# Patient Record
Sex: Female | Born: 1965 | Race: White | Hispanic: No | Marital: Married | Smoking: Former smoker
Health system: Southern US, Community
[De-identification: ages and names within clinical notes are randomized; demographics above are authoritative.]

## PROBLEM LIST (undated history)

## (undated) DIAGNOSIS — R011 Cardiac murmur, unspecified: Secondary | ICD-10-CM

## (undated) DIAGNOSIS — I7300 Raynaud's syndrome without gangrene: Secondary | ICD-10-CM

## (undated) DIAGNOSIS — R0600 Dyspnea, unspecified: Secondary | ICD-10-CM

## (undated) DIAGNOSIS — M3500 Sicca syndrome, unspecified: Secondary | ICD-10-CM

## (undated) DIAGNOSIS — F32A Depression, unspecified: Secondary | ICD-10-CM

## (undated) DIAGNOSIS — R51 Headache: Secondary | ICD-10-CM

## (undated) DIAGNOSIS — I639 Cerebral infarction, unspecified: Secondary | ICD-10-CM

## (undated) DIAGNOSIS — N189 Chronic kidney disease, unspecified: Secondary | ICD-10-CM

## (undated) DIAGNOSIS — T8859XA Other complications of anesthesia, initial encounter: Secondary | ICD-10-CM

## (undated) DIAGNOSIS — M797 Fibromyalgia: Secondary | ICD-10-CM

## (undated) DIAGNOSIS — E785 Hyperlipidemia, unspecified: Secondary | ICD-10-CM

## (undated) DIAGNOSIS — F419 Anxiety disorder, unspecified: Secondary | ICD-10-CM

## (undated) DIAGNOSIS — I10 Essential (primary) hypertension: Secondary | ICD-10-CM

## (undated) DIAGNOSIS — E119 Type 2 diabetes mellitus without complications: Secondary | ICD-10-CM

## (undated) DIAGNOSIS — I340 Nonrheumatic mitral (valve) insufficiency: Secondary | ICD-10-CM

## (undated) DIAGNOSIS — Z720 Tobacco use: Secondary | ICD-10-CM

## (undated) DIAGNOSIS — R0789 Other chest pain: Secondary | ICD-10-CM

## (undated) DIAGNOSIS — F329 Major depressive disorder, single episode, unspecified: Secondary | ICD-10-CM

## (undated) DIAGNOSIS — M329 Systemic lupus erythematosus, unspecified: Secondary | ICD-10-CM

## (undated) DIAGNOSIS — T4145XA Adverse effect of unspecified anesthetic, initial encounter: Secondary | ICD-10-CM

## (undated) DIAGNOSIS — R002 Palpitations: Secondary | ICD-10-CM

## (undated) DIAGNOSIS — M4800 Spinal stenosis, site unspecified: Secondary | ICD-10-CM

## (undated) DIAGNOSIS — M069 Rheumatoid arthritis, unspecified: Secondary | ICD-10-CM

## (undated) DIAGNOSIS — R519 Headache, unspecified: Secondary | ICD-10-CM

## (undated) DIAGNOSIS — M5416 Radiculopathy, lumbar region: Secondary | ICD-10-CM

## (undated) DIAGNOSIS — E669 Obesity, unspecified: Secondary | ICD-10-CM

## (undated) DIAGNOSIS — M4316 Spondylolisthesis, lumbar region: Secondary | ICD-10-CM

## (undated) HISTORY — DX: Radiculopathy, lumbar region: M54.16

## (undated) HISTORY — DX: Hyperlipidemia, unspecified: E78.5

## (undated) HISTORY — DX: Spinal stenosis, site unspecified: M48.00

## (undated) HISTORY — DX: Obesity, unspecified: E66.9

## (undated) HISTORY — PX: LUMBAR LAMINECTOMY: SHX95

## (undated) HISTORY — DX: Chronic kidney disease, unspecified: N18.9

## (undated) HISTORY — DX: Nonrheumatic mitral (valve) insufficiency: I34.0

## (undated) HISTORY — DX: Cerebral infarction, unspecified: I63.9

## (undated) HISTORY — DX: Other chest pain: R07.89

## (undated) HISTORY — PX: TUBAL LIGATION: SHX77

## (undated) HISTORY — DX: Anxiety disorder, unspecified: F41.9

## (undated) HISTORY — PX: ABDOMINAL HYSTERECTOMY: SHX81

## (undated) HISTORY — PX: BACK SURGERY: SHX140

## (undated) HISTORY — DX: Palpitations: R00.2

## (undated) HISTORY — PX: SPINAL CORD STIMULATOR INSERTION: SHX5378

## (undated) HISTORY — DX: Tobacco use: Z72.0

---

## 2001-06-14 ENCOUNTER — Other Ambulatory Visit: Admission: RE | Admit: 2001-06-14 | Discharge: 2001-06-14 | Payer: Self-pay | Admitting: Family Medicine

## 2005-05-06 ENCOUNTER — Ambulatory Visit: Payer: Self-pay | Admitting: Family Medicine

## 2006-09-08 ENCOUNTER — Ambulatory Visit: Payer: Self-pay | Admitting: Obstetrics and Gynecology

## 2006-09-16 ENCOUNTER — Inpatient Hospital Stay: Payer: Self-pay | Admitting: Obstetrics and Gynecology

## 2007-01-03 ENCOUNTER — Ambulatory Visit: Payer: Self-pay | Admitting: Obstetrics and Gynecology

## 2007-01-10 ENCOUNTER — Ambulatory Visit: Payer: Self-pay | Admitting: Obstetrics and Gynecology

## 2008-06-14 ENCOUNTER — Ambulatory Visit: Payer: Self-pay | Admitting: Internal Medicine

## 2011-02-23 ENCOUNTER — Ambulatory Visit: Payer: Self-pay

## 2014-05-23 DIAGNOSIS — O24419 Gestational diabetes mellitus in pregnancy, unspecified control: Secondary | ICD-10-CM | POA: Insufficient documentation

## 2014-05-23 DIAGNOSIS — R079 Chest pain, unspecified: Secondary | ICD-10-CM | POA: Insufficient documentation

## 2014-05-23 DIAGNOSIS — F32A Depression, unspecified: Secondary | ICD-10-CM | POA: Insufficient documentation

## 2014-05-23 DIAGNOSIS — F329 Major depressive disorder, single episode, unspecified: Secondary | ICD-10-CM | POA: Insufficient documentation

## 2014-05-23 DIAGNOSIS — R0602 Shortness of breath: Secondary | ICD-10-CM | POA: Insufficient documentation

## 2014-09-20 ENCOUNTER — Ambulatory Visit: Payer: Self-pay | Admitting: Nurse Practitioner

## 2015-07-22 ENCOUNTER — Emergency Department
Admission: EM | Admit: 2015-07-22 | Discharge: 2015-07-22 | Disposition: A | Discharge status: Home / Self Care | Payer: 59 | Attending: Emergency Medicine | Admitting: Emergency Medicine

## 2015-07-22 ENCOUNTER — Other Ambulatory Visit: Payer: Self-pay

## 2015-07-22 ENCOUNTER — Emergency Department: Payer: 59

## 2015-07-22 ENCOUNTER — Encounter: Payer: Self-pay | Admitting: Student

## 2015-07-22 DIAGNOSIS — R079 Chest pain, unspecified: Secondary | ICD-10-CM | POA: Insufficient documentation

## 2015-07-22 DIAGNOSIS — Z79899 Other long term (current) drug therapy: Secondary | ICD-10-CM | POA: Diagnosis not present

## 2015-07-22 DIAGNOSIS — Z720 Tobacco use: Secondary | ICD-10-CM | POA: Insufficient documentation

## 2015-07-22 DIAGNOSIS — E119 Type 2 diabetes mellitus without complications: Secondary | ICD-10-CM | POA: Diagnosis not present

## 2015-07-22 HISTORY — DX: Sjogren syndrome, unspecified: M35.00

## 2015-07-22 HISTORY — DX: Rheumatoid arthritis, unspecified: M06.9

## 2015-07-22 HISTORY — DX: Raynaud's syndrome without gangrene: I73.00

## 2015-07-22 HISTORY — DX: Type 2 diabetes mellitus without complications: E11.9

## 2015-07-22 HISTORY — DX: Systemic lupus erythematosus, unspecified: M32.9

## 2015-07-22 HISTORY — DX: Fibromyalgia: M79.7

## 2015-07-22 HISTORY — DX: Essential (primary) hypertension: I10

## 2015-07-22 LAB — BASIC METABOLIC PANEL
ANION GAP: 11 (ref 5–15)
BUN: 9 mg/dL (ref 6–20)
CHLORIDE: 101 mmol/L (ref 101–111)
CO2: 29 mmol/L (ref 22–32)
CREATININE: 0.95 mg/dL (ref 0.44–1.00)
Calcium: 9.2 mg/dL (ref 8.9–10.3)
GFR calc Af Amer: >60 mL/min (ref >60)
GFR calc non Af Amer: >60 mL/min (ref >60)
Glucose, Bld: 168 mg/dL — ABNORMAL HIGH (ref 65–99)
Potassium: 3.6 mmol/L (ref 3.5–5.1)
Sodium: 141 mmol/L (ref 135–145)

## 2015-07-22 LAB — CBC
HCT: 41.5 % (ref 35.0–47.0)
Hemoglobin: 13.9 g/dL (ref 12.0–16.0)
MCH: 30.8 pg (ref 26.0–34.0)
MCHC: 33.5 g/dL (ref 32.0–36.0)
MCV: 92.1 fL (ref 80.0–100.0)
Platelets: 282 10*3/uL (ref 150–440)
RBC: 4.51 MIL/uL (ref 3.80–5.20)
RDW: 15.1 % — AB (ref 11.5–14.5)
WBC: 8.5 10*3/uL (ref 3.6–11.0)

## 2015-07-22 LAB — TROPONIN I

## 2015-07-22 MED ORDER — IOHEXOL 300 MG/ML  SOLN
100.0000 mL | Freq: Once | INTRAMUSCULAR | Status: AC | PRN
  Administered 2015-07-22: 100 mL via INTRAVENOUS

## 2015-07-22 NOTE — ED Notes (Signed)

## 2015-07-22 NOTE — Discharge Instructions (Signed)
Chest Pain (Nonspecific) °It is often hard to give a specific diagnosis for the cause of chest pain. There is always a chance that your pain could be related to something serious, such as a heart attack or a blood clot in the lungs. You need to follow up with your health care provider for further evaluation. °CAUSES  °· Heartburn. °· Pneumonia or bronchitis. °· Anxiety or stress. °· Inflammation around your heart (pericarditis) or lung (pleuritis or pleurisy). °· A blood clot in the lung. °· A collapsed lung (pneumothorax). It can develop suddenly on its own (spontaneous pneumothorax) or from trauma to the chest. °· Shingles infection (herpes zoster virus). °The chest wall is composed of bones, muscles, and cartilage. Any of these can be the source of the pain. °· The bones can be bruised by injury. °· The muscles or cartilage can be strained by coughing or overwork. °· The cartilage can be affected by inflammation and become sore (costochondritis). °DIAGNOSIS  °Lab tests or other studies may be needed to find the cause of your pain. Your health care provider may have you take a test called an ambulatory electrocardiogram (ECG). An ECG records your heartbeat patterns over a 24-hour period. You may also have other tests, such as: °· Transthoracic echocardiogram (TTE). During echocardiography, sound waves are used to evaluate how blood flows through your heart. °· Transesophageal echocardiogram (TEE). °· Cardiac monitoring. This allows your health care provider to monitor your heart rate and rhythm in real time. °· Holter monitor. This is a portable device that records your heartbeat and can help diagnose heart arrhythmias. It allows your health care provider to track your heart activity for several days, if needed. °· Stress tests by exercise or by giving medicine that makes the heart beat faster. °TREATMENT  °· Treatment depends on what may be causing your chest pain. Treatment may include: °¨ Acid blockers for  heartburn. °¨ Anti-inflammatory medicine. °¨ Pain medicine for inflammatory conditions. °¨ Antibiotics if an infection is present. °· You may be advised to change lifestyle habits. This includes stopping smoking and avoiding alcohol, caffeine, and chocolate. °· You may be advised to keep your head raised (elevated) when sleeping. This reduces the chance of acid going backward from your stomach into your esophagus. °Most of the time, nonspecific chest pain will improve within 2-3 days with rest and mild pain medicine.  °HOME CARE INSTRUCTIONS  °· If antibiotics were prescribed, take them as directed. Finish them even if you start to feel better. °· For the next few days, avoid physical activities that bring on chest pain. Continue physical activities as directed. °· Do not use any tobacco products, including cigarettes, chewing tobacco, or electronic cigarettes. °· Avoid drinking alcohol. °· Only take medicine as directed by your health care provider. °· Follow your health care provider's suggestions for further testing if your chest pain does not go away. °· Keep any follow-up appointments you made. If you do not go to an appointment, you could develop lasting (chronic) problems with pain. If there is any problem keeping an appointment, call to reschedule. °SEEK MEDICAL CARE IF:  °· Your chest pain does not go away, even after treatment. °· You have a rash with blisters on your chest. °· You have a fever. °SEEK IMMEDIATE MEDICAL CARE IF:  °· You have increased chest pain or pain that spreads to your arm, neck, jaw, back, or abdomen. °· You have shortness of breath. °· You have an increasing cough, or you cough   up blood.  You have severe back or abdominal pain.  You feel nauseous or vomit.  You have severe weakness.  You faint.  You have chills. This is an emergency. Do not wait to see if the pain will go away. Get medical help at once. Call your local emergency services (911 in [place] Do not drive  yourself to the hospital. MAKE SURE YOU:   Understand these instructions.  Will watch your condition.  Will get help right away if you are not doing well or get worse. Document Released: 09/22/2005 Document Revised: 12/18/2013 Document Reviewed: 07/18/2008 [facility] Patient Information 2015 [place], [place]. This information is not intended to replace advice given to you by your health care provider. Make sure you discuss any questions you have with your health care provider. [facility] WILL CALL YOU TOMORROW TO SET UP AN APPOINTMENT IN THE NEXT FEW DAYS.

## 2015-07-22 NOTE — ED Provider Notes (Signed)
[facility] Emergency Department Provider Note  ____________________________________________  Time seen: Approximately 4:43 PM  I have reviewed the triage vital signs and the nursing notes.   HISTORY  Chief Complaint Chest Pain   HPI Lauren Winters is a 49 y.o. female who complains of approximately one week of chest pain. The pain is sharp in nature starts in the middle of her chest and goes through to the back. Lasts about 2-3 minutes and resolves. Nothing seems to bring it on make it worse or improve it she's had sitting on the couch excising does not change it eating does not change if breathing does not change it. Pain is severe when it occurs. Patient has had pain in her jaw couple times with it. Patient has a worse than usual all headache this headache is no you know where near the worst of her life. Patient had an episode Thursday where she got very sweaty this resolves in about 10 minutes.   Past Medical History  Diagnosis Date  . Hypertension   . Lupus (systemic lupus erythematosus)   . Rheumatoid arthritis   . Raynaud's phenomenon   . Fibromyalgia   . Sjogren's disease   . Diabetes mellitus without complication metformin    There are no active problems to display for this patient.   Past Surgical History  Procedure Laterality Date  . Abdominal hysterectomy    . Cesarean section      Current Outpatient Rx  Name  Route  Sig  Dispense  Refill  . Cholecalciferol (VITAMIN D3) 50000 UNITS CAPS   Oral   Take 1 capsule by mouth once a week.         . DULoxetine (CYMBALTA) 60 MG capsule   Oral   Take 1 capsule by mouth daily.         Marland Kitchen escitalopram (LEXAPRO) 20 MG tablet   Oral   Take 1 tablet by mouth daily.         . folic acid (FOLVITE) 1 MG tablet   Oral   Take 2 tablets by mouth daily.         . hydroxychloroquine (PLAQUENIL) 200 MG tablet   Oral   Take 1 tablet by mouth 2 (two) times daily.         . OTREXUP 20  MG/0.4ML SOAJ   Intramuscular   Inject 1 Syringe into the muscle once a week.           Dispense as written.     Allergies Clindamycin/lincomycin and Pseudoephedrine hcl  No family history on file.  Social History History  Substance Use Topics  . Smoking status: Current Every Day Smoker -- 0.50 packs/day    Types: Cigarettes  . Smokeless tobacco: Never Used  . Alcohol Use: No    Review of Systems Constitutional: No fever/chills Eyes: No visual changes. ENT: No sore throat. Cardiovascular: See history of present illness Respiratory: Denies shortness of breath. Gastrointestinal: No abdominal pain. no vomiting.  No diarrhea.  No constipation. Genitourinary: Negative for dysuria. Musculoskeletal see history of present illness Skin: Negative for rash. Neurological: Negative for headaches, focal weakness or numbness.  10-point ROS otherwise negative.  ____________________________________________   PHYSICAL EXAM:  VITAL SIGNS: ED Triage Vitals  Enc Vitals Group     BP 07/22/15 1504 152/98 mmHg     Pulse Rate 07/22/15 1504 124     Resp 07/22/15 1504 18     Temp 07/22/15 1504 98.2 F (36.8 C)  Temp Source 07/22/15 1504 Oral     SpO2 07/22/15 1504 97 %     Weight 07/22/15 1504 222 lb (100.699 kg)     Height 07/22/15 1504 5\' 7"  (1.702 m)     Head Cir --      Peak Flow --      Pain Score 07/22/15 1504 3     Pain Loc --      Pain Edu? --      Excl. in [place]? --     Constitutional: Alert and oriented. Well appearing and in no acute distress. Eyes: Conjunctivae are normal. PERRL. EOMI. Head: Atraumatic. Nose: No congestion/rhinnorhea. Mouth/Throat: Mucous membranes are moist.  Oropharynx non-erythematous. Neck: No stridor.  Cardiovascular: Normal rate, regular rhythm. Grossly normal heart sounds.  Good peripheral circulation. Respiratory: Normal respiratory effort.  No retractions. Lungs CTAB. Gastrointestinal: Soft and nontender. No distention. No abdominal  bruits. No CVA tenderness. Musculoskeletal: No lower extremity tenderness nor edema.  No joint effusions. Neurologic:  Normal speech and language. No gross focal neurologic deficits are appreciated. No gait instability. Skin:  Skin is warm, dry and intact. No rash noted. Psychiatric: Mood and affect are normal. Speech and behavior are normal.  ____________________________________________   LABS (all labs ordered are listed, but only abnormal results are displayed)  Labs Reviewed  BASIC METABOLIC PANEL - Abnormal; Notable for the following:    Glucose, Bld 168 (*)    All other components within normal limits  CBC - Abnormal; Notable for the following:    RDW 15.1 (*)    All other components within normal limits  TROPONIN I  TROPONIN I   ____________________________________________  EKG   EKG #1 read and interpreted by me shows sinus tachycardia at 108 bpm. Normal axis no acute ST-T wave changes EKG #2 was also read and interpreted by me that shows sinus tachycardia rate of 102 left axis -6 the other one was +6 of the really isn't much change. EKG is otherwise the same as the previous one ____________________________________________  RADIOLOGY  Chest x-ray was read as normal ____________________________________________   PROCEDURES    ____________________________________________   INITIAL IMPRESSION / ASSESSMENT AND PLAN / ED COURSE  Pertinent labs & imaging results that were available during my care of the patient were reviewed by me and considered in my medical decision making (see chart for details).  Repeat troponin was negative chest x-ray and CT of the chest showed no acute abnormalities. I discussed the patient with a [facility] and they will give her call tomorrow to set her up with an appointment in the next few days ____________________________________________   FINAL CLINICAL IMPRESSION(S) / ED DIAGNOSES  Final diagnoses:  Chest pain,  unspecified chest pain type      Nena Polio, MD 07/22/15 [id number]

## 2015-07-22 NOTE — ED Notes (Signed)
Pt states has been having intermittent chest pain for two weeks.  Reports nausea, jaw pain and headache associated with chest pain.  Denies shortness of breath. Pt states has had a dry cough. Pt in no acute distress at this time.

## 2015-07-23 ENCOUNTER — Telehealth: Payer: Self-pay

## 2015-07-23 NOTE — Telephone Encounter (Signed)
l mom to call and schedule f/u ED appt.

## 2015-07-24 ENCOUNTER — Encounter: Payer: Self-pay | Admitting: Physician Assistant

## 2015-07-24 ENCOUNTER — Encounter: Payer: Self-pay | Admitting: Cardiovascular Disease

## 2015-07-24 DIAGNOSIS — I10 Essential (primary) hypertension: Secondary | ICD-10-CM | POA: Insufficient documentation

## 2015-07-24 DIAGNOSIS — I340 Nonrheumatic mitral (valve) insufficiency: Secondary | ICD-10-CM | POA: Insufficient documentation

## 2015-07-24 DIAGNOSIS — E669 Obesity, unspecified: Secondary | ICD-10-CM | POA: Insufficient documentation

## 2015-07-24 DIAGNOSIS — E119 Type 2 diabetes mellitus without complications: Secondary | ICD-10-CM | POA: Insufficient documentation

## 2015-07-24 DIAGNOSIS — R0789 Other chest pain: Secondary | ICD-10-CM | POA: Insufficient documentation

## 2015-07-24 DIAGNOSIS — F419 Anxiety disorder, unspecified: Secondary | ICD-10-CM | POA: Insufficient documentation

## 2015-07-24 DIAGNOSIS — F411 Generalized anxiety disorder: Secondary | ICD-10-CM | POA: Insufficient documentation

## 2015-07-24 DIAGNOSIS — Z720 Tobacco use: Secondary | ICD-10-CM | POA: Insufficient documentation

## 2015-07-25 ENCOUNTER — Ambulatory Visit (INDEPENDENT_AMBULATORY_CARE_PROVIDER_SITE_OTHER): Payer: 59 | Admitting: Physician Assistant

## 2015-07-25 ENCOUNTER — Encounter: Payer: Self-pay | Admitting: Physician Assistant

## 2015-07-25 VITALS — BP 140/90 | HR 76 | Ht 67.0 in | Wt 222.0 lb

## 2015-07-25 DIAGNOSIS — E669 Obesity, unspecified: Secondary | ICD-10-CM

## 2015-07-25 DIAGNOSIS — F419 Anxiety disorder, unspecified: Secondary | ICD-10-CM

## 2015-07-25 DIAGNOSIS — Z720 Tobacco use: Secondary | ICD-10-CM | POA: Diagnosis not present

## 2015-07-25 DIAGNOSIS — R0602 Shortness of breath: Secondary | ICD-10-CM

## 2015-07-25 DIAGNOSIS — R0789 Other chest pain: Secondary | ICD-10-CM

## 2015-07-25 DIAGNOSIS — I340 Nonrheumatic mitral (valve) insufficiency: Secondary | ICD-10-CM

## 2015-07-25 DIAGNOSIS — R079 Chest pain, unspecified: Secondary | ICD-10-CM

## 2015-07-25 DIAGNOSIS — I10 Essential (primary) hypertension: Secondary | ICD-10-CM

## 2015-07-25 NOTE — Progress Notes (Signed)
Cardiology Hospital Follow Up Note:   Date of Encounter: 07/25/2015  ID: Lauren Winters, DOB 1966/01/21, MRN [id number]  PCP: Arnette Norris, MD Primary Cardiologist: Dr. Fletcher Anon, MD  Chief Complaint  Patient presents with  . other    Patient was just at [facility] ER on 07/22/2015 with chest pain. Meds reviewed by the patient verbally.      HPI:  49 year old female with history of HTN, HLD, DM2, Lupus, fibromyalgia, Sjogren's syndrome, RA, anxiety, atypical chest pain, and ongoing tobacco abuse who presents for ED follow up of chest pain.    Patient was previously seen by Dr. Serafina Royals, MD. She previously saw him on 05/23/2014 for complaint of chest pain/heaviness that was radiating to the jaw that was occuring with stress and anxiety. She was also note to be complaining of SOB that began back in January 2015 that occured with mild exertion and limited her ADLs and was improved with rest. She underwent echo on 06/05/2014 that showed EF >55%, mild MR, mild AR, normal LV and RV systolic function, no valvular stenosis and ETT on the same day that was normal per follow up note, without evidence of myocardial ischemia or chest pain at peak stress, with good exercise tolerance. In follow up on 06/12/2014 with Dr. Nehemiah Massed, she wished to not pursue statin therapy given prior fatigue and arthralgia with statins though she did have significant coronary risk factors. Plan was to continue her Bp regimen without further cardiac intervention for her above valvular heart disease as this was stable at that time. There was also no plan for cardiac intervention for her chest pain given her normal stress test or plans for further intervention given her SOB given mulifactoral etiologies and stable nature. She was advised to reduce her CAD risks.   She presented to [facility] ED on 7/26 with complaints of chest pain x 1 week that has been sharp in nature and starts in the middle of her chest and goes through to the back.  Pain is intermittent, lasting 2-3 minutes and self resolving. Nothing makes pain worse or improves it. She has had jaw pain x 1. No associated nausea, vomiting, diaphoresis, SOB, presyncope, or syncope.    Work up at the ED included negative troponin x 2, unremarkable bmet, and CBC. CXR showed no active cardiopulmonary disease. CTA chest was negative for PE and was negative for significant abnormality, including the thoracic aorta was normal in caliber and intact. Inital EKG sinus tachycardia, 108 bpm, moderate voltage critera for LVH, nonspecific st/t changes along anterior and inferolateral leads. Follow up EKF showed sinus tachycardia, 102 bpm, TWI III, otherwise unchaged. BP was 152/98. She was discharged from the ED with outpatient follow up.   Since her discharge from the ED she has continued to have intermittent sharp chest pain that is both exertional and non-exertional, though per her report seems to be mostly non-exertional. She has only had exertional symptoms x 1, which was when she was climbing stairs to her work. Symptoms resolved with rest. She has chronic SOB at baseline, which has not worsened, at at time of this visit she denies any SOB. She denies any diaphoresis, nausea, vomiting, presyncope, or syncope. She reports quitting smoking on 07/22/2015. She is currently chest pain free.        Past Medical History  Diagnosis Date  . Hypertension   . Lupus (systemic lupus erythematosus)   . Rheumatoid arthritis   . Raynaud's phenomenon   . Fibromyalgia   .  Sjogren's disease   . Diabetes mellitus without complication metformin  . Mitral regurgitation     a. 05/2014 echo: EF >55%, nl LV/RV sys fxn, mild MR/AR, no valvular stenosis   . Atypical chest pain     a. ETT 05/2014: normal stress test without evidence of myocardial ischemia or chest pain at peak stress  . Tobacco abuse     a. ongoing; b. ongoing SOB  . Obesity (BMI 30-39.9)   . Anxiety   : Past Surgical History  Procedure  Laterality Date  . Abdominal hysterectomy    . Cesarean section    : Family History  Problem Relation Age of Onset  . Hypertension Mother   . Valvular heart disease Mother   . CAD Father 61    CABG x 4  . Liver cancer Father 72    passed  . CAD Paternal Aunt     CABG  . CAD Paternal Aunt     CABG  . CAD Paternal Aunt     CABG  . CAD Paternal Aunt     CABG  . CAD Paternal Aunt     CABG  . CAD Paternal Uncle     MI s/p CABG  . Stroke Maternal Grandmother   . Stroke Maternal Grandfather   . CAD Maternal Grandfather   :  reports that she quit smoking 3 days ago. Her smoking use included Cigarettes. She smoked 0.50 packs per day. She has never used smokeless tobacco. She reports that she does not drink alcohol or use illicit drugs.:   Allergies:  Allergies  Allergen Reactions  . Clindamycin/Lincomycin Hives  . Pseudoephedrine Hcl Other (See Comments)    Dizziness      Home Medications:  Current Outpatient Prescriptions  Medication Sig Dispense Refill  . Cholecalciferol (VITAMIN D3) 50000 UNITS CAPS Take 1 capsule by mouth once a week.    . escitalopram (LEXAPRO) 20 MG tablet Take 20 mg by mouth daily.     . folic acid (FOLVITE) 1 MG tablet Take 2 tablets by mouth daily.    . hydroxychloroquine (PLAQUENIL) 200 MG tablet Take 1 tablet by mouth 2 (two) times daily.    . OTREXUP 20 MG/0.4ML SOAJ Inject 1 Syringe into the muscle once a week.     No current facility-administered medications for this visit.     Review of Systems:  Review of Systems  Constitutional: Positive for malaise/fatigue. Negative for fever, chills, weight loss and diaphoresis.  HENT: Negative for congestion.   Eyes: Negative for discharge and redness.  Respiratory: Positive for shortness of breath. Negative for cough, hemoptysis, sputum production and wheezing.        SOB long standing issue for her and unchanged   Cardiovascular: Positive for chest pain. Negative for palpitations, orthopnea,  claudication, leg swelling and PND.  Gastrointestinal: Negative for heartburn, nausea, vomiting and abdominal pain.  Musculoskeletal: Negative for myalgias and falls.  Skin: Negative for rash.  Neurological: Positive for weakness and headaches. Negative for sensory change, speech change and focal weakness.  Endo/Heme/Allergies: Does not bruise/bleed easily.  Psychiatric/Behavioral: Positive for substance abuse. The patient is nervous/anxious.        Tobacco abuse  All other systems reviewed and are negative.    Physical Exam:  Blood pressure 140/90, pulse 76, height 5\' 7"  (1.702 m), weight 222 lb (100.699 kg). BMI: Body mass index is 34.76 kg/(m^2). General: Pleasant, NAD. Psych: Normal affect. Responds to questions with normal affect.  Neuro: Alert and oriented  X 3. Moves all extremities spontaneously. HEENT: Normocephalic, atraumatic. EOM intact bilaterally. Sclera anicteric.  Neck: Trachea midline. Supple without bruits or JVD. Lungs:  Respirations regular and unlabored, CTA bilaterally without wheezing, crackles, or rhonchi.  Heart: RRR, normal s3, s4. No murmurs, rubs, or gallops.  Abdomen: Obese, soft, non-tender, non-distended, BS + x 4.  Extremities: No clubbing, cyanosis or edema. DP/PT/Radials 2+ and equal bilaterally.   Accessory Clinical Findings:  EKG: NSR, 76 bpm, TWI III  Recent Labs: 07/22/2015: BUN 9; Creatinine, Ser 0.95; Hemoglobin 13.9; Platelets 282; Potassium 3.6; Sodium 141  No results found for: CHOL, TRIG, HDL, CHOLHDL, VLDL, LDLCALC, LDLDIRECT  Weights: Wt Readings from Last 3 Encounters:  07/25/15 222 lb (100.699 kg)  07/22/15 222 lb (100.699 kg)    Estimated Creatinine Clearance: 85.4 mL/min (by C-G formula based on Cr of 0.95).   Other studies Reviewed: Additional studies/ records that were reviewed today include: [facility] notes and studies, and [facility] ED visit.  Assessment & Plan:  1. Chest pain: -Schedule Treadmill Myoview to evaluate  for high risk ischemia. Ultimately, she may require cardiac cath should her symptoms persist given given her family history and prior smoking history  -Schedule echo to evaluate LV function and wall motion  -Recent CTA chest negative for PE and aortic pathology  -Start aspirin 81 mg daily  -Possible some component of anxiety vs fibromyalgia   2. Chronic SOB: -Stable if not improved since she quit smoking  -Check echo as above -Should symptoms worsen would advise patient to see pulmonology   3. HTN: -Controlled  4. HLD: -Schedule FLP prior to next office visit   5. DM2: -Per PCP  5. Rheumatologic diseases: -Per PCP  6. Tobacco abuse: -Quit on 07/22/2015  7. Mild MR: -Check echo as above   Dispo: -Follow up 3 months  Current medicines are reviewed at length with the patient today.  The patient did not have any concerns regarding medicines.   Christell Faith, PA-C [address] [place], [place] [address] [phone] [facility] 07/25/2015, 3:28 PM

## 2015-07-25 NOTE — Patient Instructions (Addendum)
Medication Instructions:  Your physician recommends that you continue on your current medications as directed. Please refer to the Current Medication list given to you today.   Labwork: none  Testing/Procedures: Your physician has requested that you have a lexiscan myoview.  [place]  Your caregiver has ordered a Stress Test with nuclear imaging. The purpose of this test is to evaluate the blood supply to your heart muscle. This procedure is referred to as a "Non-Invasive Stress Test." This is because other than having an IV started in your vein, nothing is inserted or "invades" your body. Cardiac stress tests are done to find areas of poor blood flow to the heart by determining the extent of coronary artery disease (CAD). Some patients exercise on a treadmill, which naturally increases the blood flow to your heart, while others who are  unable to walk on a treadmill due to physical limitations have a pharmacologic/chemical stress agent called Lexiscan . This medicine will mimic walking on a treadmill by temporarily increasing your coronary blood flow.   Please note: these test may take anywhere between 2-4 hours to complete  PLEASE REPORT TO [facility] AT THE FIRST DESK WILL DIRECT YOU WHERE TO GO  Date of Procedure: Friday, August 5 at 8:30am Arrival Time for Procedure: 7:45am   PLEASE NOTIFY THE OFFICE AT LEAST 24 HOURS IN ADVANCE IF YOU ARE UNABLE TO KEEP YOUR APPOINTMENT.  [phone] AND  PLEASE NOTIFY NUCLEAR MEDICINE AT [facility] AT LEAST 24 HOURS IN ADVANCE IF YOU ARE UNABLE TO KEEP YOUR APPOINTMENT. [phone]  How to prepare for your Myoview test:   Do not eat or drink after midnight  No caffeine for 24 hours prior to test  No smoking 24 hours prior to test.  Your medication may be taken with water.  If your doctor stopped a medication because of this test, do not take that medication.  Ladies, please do not wear dresses.  Skirts or  pants are appropriate. Please wear a short sleeve shirt.  No perfume, cologne or lotion.  Wear comfortable walking shoes. No heels!    Your physician has requested that you have an echocardiogram. Echocardiography is a painless test that uses sound waves to create images of your heart. It provides your doctor with information about the size and shape of your heart and how well your heart's chambers and valves are working. This procedure takes approximately one hour. There are no restrictions for this procedure.          Follow-Up: Your physician recommends that you schedule a follow-up appointment in: one month with Christell Faith, PA-C or Dr. Fletcher Anon   Any Other Special Instructions Will Be Listed Below (If Applicable).  Echocardiogram An echocardiogram, or echocardiography, uses sound waves (ultrasound) to produce an image of your heart. The echocardiogram is simple, painless, obtained within a short period of time, and offers valuable information to your health care provider. The images from an echocardiogram can provide information such as:  Evidence of coronary artery disease (CAD).  Heart size.  Heart muscle function.  Heart valve function.  Aneurysm detection.  Evidence of a past heart attack.  Fluid buildup around the heart.  Heart muscle thickening.  Assess heart valve function. LET [facility] CARE PROVIDER KNOW ABOUT:  Any allergies you have.  All medicines you are taking, including vitamins, herbs, eye drops, creams, and over-the-counter medicines.  Previous problems you or members of your family have had with the use of  anesthetics.  Any blood disorders you have.  Previous surgeries you have had.  Medical conditions you have.  Possibility of pregnancy, if this applies. BEFORE THE PROCEDURE  No special preparation is needed. Eat and drink normally.  PROCEDURE   In order to produce an image of your heart, gel will be applied to your chest and a  wand-like tool (transducer) will be moved over your chest. The gel will help transmit the sound waves from the transducer. The sound waves will harmlessly bounce off your heart to allow the heart images to be captured in real-time motion. These images will then be recorded.  You may need an IV to receive a medicine that improves the quality of the pictures. AFTER THE PROCEDURE You may return to your normal schedule including diet, activities, and medicines, unless your health care provider tells you otherwise. Document Released: 12/10/2000 Document Revised: 04/29/2014 Document Reviewed: 08/20/2013 [facility] Patient Information 2015 [place], [place]. This information is not intended to replace advice given to you by your health care provider. Make sure you discuss any questions you have with your health care provider.

## 2015-07-31 ENCOUNTER — Other Ambulatory Visit: Payer: Self-pay

## 2015-07-31 ENCOUNTER — Ambulatory Visit (INDEPENDENT_AMBULATORY_CARE_PROVIDER_SITE_OTHER): Payer: 59

## 2015-07-31 DIAGNOSIS — R079 Chest pain, unspecified: Secondary | ICD-10-CM | POA: Diagnosis not present

## 2015-08-01 ENCOUNTER — Encounter
Admission: RE | Admit: 2015-08-01 | Discharge: 2015-08-01 | Disposition: A | Discharge status: Home / Self Care | Payer: 59 | Source: Ambulatory Visit | Attending: Physician Assistant | Admitting: Physician Assistant

## 2015-08-01 ENCOUNTER — Other Ambulatory Visit: Payer: Self-pay | Admitting: Physician Assistant

## 2015-08-01 ENCOUNTER — Encounter: Payer: Self-pay | Admitting: Physician Assistant

## 2015-08-01 DIAGNOSIS — R079 Chest pain, unspecified: Secondary | ICD-10-CM | POA: Diagnosis not present

## 2015-08-01 LAB — NM MYOCAR MULTI W/SPECT W/WALL MOTION / EF
CHL CUP RESTING HR STRESS: 71 {beats}/min
CSEPPHR: 150 {beats}/min
Estimated workload: 5.8 METS
Exercise duration (min): 4 min
LV dias vol: 78 mL
LV sys vol: 27 mL
NUC STRESS TID: 0.92
Percent HR: 88 %
SDS: 0
SRS: 1
SSS: 1

## 2015-08-01 MED ORDER — TECHNETIUM TC 99M SESTAMIBI GENERIC - CARDIOLITE
13.8500 | Freq: Once | INTRAVENOUS | Status: AC | PRN
  Administered 2015-08-01: 13.847 via INTRAVENOUS

## 2015-08-01 MED ORDER — METOPROLOL TARTRATE 50 MG PO TABS: 25.0000 mg | ORAL_TABLET | Freq: Two times a day (BID) | ORAL | Status: DC

## 2015-08-01 MED ORDER — TECHNETIUM TC 99M SESTAMIBI GENERIC - CARDIOLITE
30.0000 | Freq: Once | INTRAVENOUS | Status: AC | PRN
  Administered 2015-08-01: 32.09 via INTRAVENOUS

## 2015-08-01 NOTE — Progress Notes (Signed)
Typed OOW note for patient.

## 2015-08-04 ENCOUNTER — Encounter: Payer: Self-pay | Admitting: Primary Care

## 2015-08-04 ENCOUNTER — Telehealth: Payer: Self-pay | Admitting: Physician Assistant

## 2015-08-04 ENCOUNTER — Ambulatory Visit (INDEPENDENT_AMBULATORY_CARE_PROVIDER_SITE_OTHER): Payer: 59 | Admitting: Primary Care

## 2015-08-04 VITALS — BP 148/96 | HR 75 | Temp 97.9°F | Ht 67.0 in | Wt 219.8 lb

## 2015-08-04 DIAGNOSIS — E119 Type 2 diabetes mellitus without complications: Secondary | ICD-10-CM

## 2015-08-04 DIAGNOSIS — M329 Systemic lupus erythematosus, unspecified: Secondary | ICD-10-CM

## 2015-08-04 DIAGNOSIS — I10 Essential (primary) hypertension: Secondary | ICD-10-CM

## 2015-08-04 DIAGNOSIS — R768 Other specified abnormal immunological findings in serum: Secondary | ICD-10-CM | POA: Insufficient documentation

## 2015-08-04 DIAGNOSIS — R0789 Other chest pain: Secondary | ICD-10-CM | POA: Diagnosis not present

## 2015-08-04 DIAGNOSIS — M069 Rheumatoid arthritis, unspecified: Secondary | ICD-10-CM

## 2015-08-04 DIAGNOSIS — M3500 Sicca syndrome, unspecified: Secondary | ICD-10-CM

## 2015-08-04 DIAGNOSIS — M3219 Other organ or system involvement in systemic lupus erythematosus: Secondary | ICD-10-CM | POA: Insufficient documentation

## 2015-08-04 HISTORY — DX: Other specified abnormal immunological findings in serum: R76.8

## 2015-08-04 LAB — HEMOGLOBIN A1C: Hgb A1c MFr Bld: 5.9 % (ref 4.6–6.5)

## 2015-08-04 MED ORDER — VALSARTAN 320 MG PO TABS: 320.0000 mg | ORAL_TABLET | Freq: Every day | ORAL | Status: DC

## 2015-08-04 MED ORDER — METFORMIN HCL 500 MG PO TABS: 500.0000 mg | ORAL_TABLET | Freq: Two times a day (BID) | ORAL | Status: DC

## 2015-08-04 NOTE — Assessment & Plan Note (Signed)
Follows with Dr. Judi Cong. Next appointment is in December.

## 2015-08-04 NOTE — Progress Notes (Signed)
Subjective:    Patient ID: Lauren Winters, female    DOB: 15-Oct-1966, 49 y.o.   MRN: [id number]  HPI  [name] is a 49 year old female who presents today to establish care and discuss the problems mentioned below. Will obtain old records. She is also here for hospital follow up.  1) Diabetes Type 2: Diagnosed in March 2016. She is managed on metformin 500 mg BID. She believes her A1C was 6.8 and last measured in March 2016.   2) Anxiety: Managed on Lexapro 20 mg and without the medication she feels very irritable. She feels well managed at her current dose.  3) Essential Hypertension: Diagnosed in 2007. She is managed on valsartan 320 mg and was recently started on metoprolol 50 mg per cardiology. She does not check her blood pressure at home because her blood pressure had been stable. She reports headaches, chest pain. Denies blurred vision. She reports some dizziness since starting the metoprolol. Next appointment with cardiology is in September.   4) Hospital Follow up: Presented to [facility] ED on 7/26 with a chief complaint of chest pain. She underwent full working up including chest xray, CTA, and lab work including troponin. All labs and tests were negative. She followed up with cardiology on 7/29 and had a treadmill myoview on 08/01/15 to evaluate for high risk ischemia. She underwent echocardiogram on 07/31/15 which resulted in normal LV function, mild mitral regurgitation and mild aortic insuffiencey.   5) Lupus/Rheumatoid Arthritis/Sjogrens: Diagnosed with Lupus 2 years ago. She managed by Dr. Valora Piccolo at [place] and has an appointment this December. She will get steroid injections once every 3 months. She also reports fatigue/exhaustion that started in February 2014 and is now affecting her work life as she feels tired. She recently had her thyroid and CBC panel checked by Dr. Judi Cong and reports these levels were within normal limits.   Review of Systems    Constitutional: Negative for unexpected weight change.  HENT: Negative for rhinorrhea.   Respiratory: Negative for cough and shortness of breath.   Cardiovascular: Negative for chest pain.  Gastrointestinal: Negative for diarrhea and constipation.  Genitourinary: Negative for difficulty urinating.  Musculoskeletal: Positive for arthralgias. Negative for myalgias.       Chronic joint pain with RA.   Skin: Negative for rash.  Neurological: Negative for headaches.  Psychiatric/Behavioral:       See HPI       Past Medical History  Diagnosis Date  . Hypertension   . Lupus (systemic lupus erythematosus)   . Rheumatoid arthritis   . Raynaud's phenomenon   . Fibromyalgia   . Sjogren's disease   . Diabetes mellitus without complication metformin  . Mitral regurgitation     a. 05/2014 echo: EF >55%, nl LV/RV sys fxn, mild MR/AR, no valvular stenosis   . Atypical chest pain     a. ETT 05/2014: normal stress test without evidence of myocardial ischemia or chest pain at peak stress  . Tobacco abuse     a. ongoing; b. ongoing SOB  . Obesity (BMI 30-39.9)   . Anxiety     History   Social History  . Marital Status: Married    Spouse Name: N/A  . Number of Children: N/A  . Years of Education: N/A   Occupational History  . Not on file.   Social History Main Topics  . Smoking status: Former Smoker -- 0.50 packs/day    Types: Cigarettes    Quit date:  07/22/2015  . Smokeless tobacco: Never Used  . Alcohol Use: No  . Drug Use: No  . Sexual Activity: Not on file   Other Topics Concern  . Not on file   Social History Narrative   Married.   3 children.   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     Past Surgical History  Procedure Laterality Date  . Abdominal hysterectomy    . Cesarean section      x3    Family History  Problem Relation Age of Onset  . Hypertension Mother   . Valvular heart disease Mother   . CAD Father 67    CABG x  4  . Liver cancer Father 29    passed  . CAD Paternal Aunt     CABG  . CAD Paternal Aunt     CABG  . CAD Paternal Aunt     CABG  . CAD Paternal Aunt     CABG  . CAD Paternal Aunt     CABG  . CAD Paternal Uncle     MI s/p CABG  . Stroke Maternal Grandmother   . Stroke Maternal Grandfather   . CAD Maternal Grandfather     Allergies  Allergen Reactions  . Clindamycin/Lincomycin Hives  . Pseudoephedrine Hcl Other (See Comments)    Dizziness     Current Outpatient Prescriptions on File Prior to Visit  Medication Sig Dispense Refill  . Cholecalciferol (VITAMIN D3) 50000 UNITS CAPS Take 1 capsule by mouth once a week.    . escitalopram (LEXAPRO) 20 MG tablet Take 20 mg by mouth daily.     . folic acid (FOLVITE) 1 MG tablet Take 2 tablets by mouth daily.    . hydroxychloroquine (PLAQUENIL) 200 MG tablet Take 1 tablet by mouth 2 (two) times daily.    . metoprolol (LOPRESSOR) 50 MG tablet Take 0.5 tablets (25 mg total) by mouth 2 (two) times daily. 60 tablet 5  . OTREXUP 20 MG/0.4ML SOAJ Inject 1 Syringe into the muscle once a week.     No current facility-administered medications on file prior to visit.    BP 148/96 mmHg  Pulse 75  Temp(Src) 97.9 F (36.6 C) (Oral)  Ht 5\' 7"  (1.702 m)  Wt 219 lb 12.8 oz (99.701 kg)  BMI 34.42 kg/m2  SpO2 98%    Objective:   Physical Exam  Constitutional: She is oriented to person, place, and time. She appears well-nourished.  HENT:  Head: Normocephalic.  Neck: Neck supple.  Cardiovascular: Normal rate and regular rhythm.   Pulmonary/Chest: Effort normal and breath sounds normal.  Neurological: She is alert and oriented to person, place, and time.  Skin: Skin is warm and dry.  Psychiatric: She has a normal mood and affect.          Assessment & Plan:

## 2015-08-04 NOTE — Progress Notes (Signed)
Pre visit review using our clinic review tool, if applicable. No additional management support is needed unless otherwise documented below in the visit note. 

## 2015-08-04 NOTE — Assessment & Plan Note (Signed)
Diagnosed in March 2016 with A1C at 6.8? Recheck A1C today and is 5.9. Continue metformin 500 BID. Will continue to monitor and discuss her diet next visit in one month.

## 2015-08-04 NOTE — Patient Instructions (Signed)
Complete lab work prior to leaving today. I will notify you of your results.  I have sent in refills of your Losartan. I will refill the Metformin depending on your lab results.  Follow up in 4 weeks for re-evaluation of blood pressure and diabetes.   It was a pleasure to meet you today! Please don't hesitate to call me with any questions. Welcome to [facility]!

## 2015-08-04 NOTE — Assessment & Plan Note (Signed)
Follows with Dr. Judi Cong. Next appointment is in December

## 2015-08-04 NOTE — Assessment & Plan Note (Signed)
Follows with cardiology. Work up at [facility] ED negative. Stress test and echo obtained, mostly unremarkable. She is to follow up in one month for re-evaluation. Will continue to monitor BP.

## 2015-08-04 NOTE — Assessment & Plan Note (Signed)
Since 2007, once well managed on losartan alone. Cardiology evaluated and added metoprolol 50 mg recently. BP elevated today, due to recent addition of metoprolol will have patient check her BP at home, record, and bring them to our next visit. If BP remains consistently elevated will consider adding additional med.

## 2015-08-04 NOTE — Telephone Encounter (Signed)
Lauren Winters .  Patient says she can leave msg on cell.

## 2015-08-04 NOTE — Telephone Encounter (Signed)
See results not. LM on cell phone

## 2015-08-05 ENCOUNTER — Other Ambulatory Visit: Payer: Self-pay | Admitting: Physical Medicine and Rehabilitation

## 2015-08-05 DIAGNOSIS — M545 Low back pain: Secondary | ICD-10-CM

## 2015-08-13 ENCOUNTER — Encounter: Payer: Self-pay | Admitting: Primary Care

## 2015-08-13 ENCOUNTER — Other Ambulatory Visit: Payer: Self-pay | Admitting: Primary Care

## 2015-08-13 DIAGNOSIS — I10 Essential (primary) hypertension: Secondary | ICD-10-CM

## 2015-08-13 MED ORDER — HYDROCHLOROTHIAZIDE 25 MG PO TABS: 25.0000 mg | ORAL_TABLET | Freq: Every day | ORAL | Status: DC

## 2015-08-14 ENCOUNTER — Telehealth: Payer: Self-pay

## 2015-08-14 ENCOUNTER — Telehealth: Payer: Self-pay | Admitting: *Deleted

## 2015-08-14 ENCOUNTER — Encounter: Payer: Self-pay | Admitting: Physician Assistant

## 2015-08-14 DIAGNOSIS — I272 Pulmonary hypertension, unspecified: Secondary | ICD-10-CM

## 2015-08-14 NOTE — Telephone Encounter (Signed)
S/w pt who states she is still having  chest pain in the center, exactly like what she was experiencing when she went to the hospital Often moves up to her jaw and states it is lasting longer, often with sweating however, she states she has night sweats. Denies pain in left arm, has had a few times when her fingers tingle. Feels like heart "jumps or skips a beat" which takes her breath.  States she has had chest pain today, unsure of how long it lasted. Went to sleep and felt a little better.   I spoke with Christell Faith, PA-C who is currently in the office. Per Thurmond Butts: if patient feels as if she needs to be seen immediately, go to the ER. Pt needs referral to pulmonary.  Reviewed Ryan's recommendations with pt who verbalized understanding and is in agreement. Pt had no further questions.  Pulmonary referral placed and forwarded to scheduling.

## 2015-08-14 NOTE — Telephone Encounter (Signed)
Left message on pt cell phone VM regarding Ryan's recommendations and upcoming pulmonology appt. Left CB number if questions.

## 2015-08-14 NOTE — Telephone Encounter (Signed)
Please inform patient that should she continue to experience symptoms that are concerning her overnight she may need to go to the ED. We have made her an appointment with pulmonology for further evaluation as well. Should she continue to have symptoms, ultimately she may require cardiac cath.

## 2015-08-14 NOTE — Telephone Encounter (Signed)
--   Message -----    From: Lauren Winters    Sent: 08/14/2015 12:02 PM EDT      To: Lauren Winters Subject: Non-Urgent Medical Question  Should I be concerned if I am still having chest pains off and on? I know my tests came out ok which is a huge relief.  Just wasn't sure about the continued chest pain. Thank you, Lauren Winters  Above message copied from my chart message sent by pt.  I responded to pt that I would call her to discuss her symptoms.  I placed call to pt and left message on mobile and home number to call office.

## 2015-08-18 ENCOUNTER — Encounter: Payer: Self-pay | Admitting: Physician Assistant

## 2015-08-18 ENCOUNTER — Ambulatory Visit: Payer: 59 | Admitting: Internal Medicine

## 2015-08-20 ENCOUNTER — Encounter: Payer: Self-pay | Admitting: Primary Care

## 2015-08-20 ENCOUNTER — Telehealth: Payer: Self-pay | Admitting: Physician Assistant

## 2015-08-20 ENCOUNTER — Encounter: Payer: Self-pay | Admitting: Physician Assistant

## 2015-08-20 NOTE — Telephone Encounter (Signed)
LMOV for patient to call office.  Attempted multiple times to schedule Pulmonary referral appt.   08/20/15 [facility]

## 2015-08-25 ENCOUNTER — Encounter: Payer: Self-pay | Admitting: Primary Care

## 2015-08-26 ENCOUNTER — Encounter: Payer: Self-pay | Admitting: Primary Care

## 2015-08-26 ENCOUNTER — Encounter: Payer: Self-pay | Admitting: Physician Assistant

## 2015-08-29 ENCOUNTER — Ambulatory Visit (INDEPENDENT_AMBULATORY_CARE_PROVIDER_SITE_OTHER): Payer: 59 | Admitting: Cardiovascular Disease

## 2015-08-29 ENCOUNTER — Encounter: Payer: Self-pay | Admitting: Cardiovascular Disease

## 2015-08-29 VITALS — BP 130/82 | HR 77 | Ht 67.0 in | Wt 216.5 lb

## 2015-08-29 DIAGNOSIS — I10 Essential (primary) hypertension: Secondary | ICD-10-CM

## 2015-08-29 DIAGNOSIS — R079 Chest pain, unspecified: Secondary | ICD-10-CM

## 2015-08-29 DIAGNOSIS — E785 Hyperlipidemia, unspecified: Secondary | ICD-10-CM | POA: Diagnosis not present

## 2015-08-29 DIAGNOSIS — R0789 Other chest pain: Secondary | ICD-10-CM | POA: Diagnosis not present

## 2015-08-29 NOTE — Assessment & Plan Note (Signed)
Blood pressure is reasonably controlled on current medications. 

## 2015-08-29 NOTE — Assessment & Plan Note (Signed)
The chest pain is overall atypical with negative recent cardiac workup including an echocardiogram and a stress test. She has chronic exertional dyspnea and history of smoking and thus I think it's reasonable to obtain pulmonary evaluation. I think cardiac catheterization should be a last resort if she continues to have an unexplained symptoms. I feel that her symptoms are overall atypical.

## 2015-08-29 NOTE — Patient Instructions (Signed)
Medication Instructions:  Your physician recommends that you continue on your current medications as directed. Please refer to the Current Medication list given to you today.   Labwork: Your physician recommends that you return for a FASTING lipid and liver profile   Testing/Procedures: none  Follow-Up: Your physician recommends that you schedule a follow-up appointment in: 3 months with Dr. Fletcher Anon.    Any Other Special Instructions Will Be Listed Below (If Applicable).

## 2015-08-29 NOTE — Progress Notes (Signed)
Primary care provider: Alma Friendly, NP  HPI  49 year old female with history of HTN, HLD, DM2, Lupus, fibromyalgia, Sjogren's syndrome, RA, anxiety, atypical chest pain, and ongoing tobacco abuse who presents for a follow-up visit regarding chest pain.   She had previous negative cardiac workup in 2015 with Dr. Nehemiah Massed.   She presented to [facility] ED on 7/26 with complaints of chest pain x 1 week that has been sharp in nature and starts in the middle of her chest and goes through to the back. Pain is intermittent, lasting 2-3 minutes and self resolving. Nothing makes pain worse or improves it. She has had jaw pain x 1. No associated nausea, vomiting, diaphoresis, SOB, presyncope, or syncope.    Work up at the ED included negative troponin x 2, unremarkable bmet, and CBC. CXR showed no active cardiopulmonary disease. CTA chest was negative for PE and was negative for significant abnormality, including the thoracic aorta was normal in caliber and intact.  She underwent a treadmill nuclear stress test which showed no evidence of ischemia with normal ejection fraction. She reports having chest pain in the recovery phase of stress test which did not respond to nitroglycerin. She had an echocardiogram done which showed normal LV systolic function with mild mitral and aortic insufficiency. She quit smoking last month. She used to smoke half a pack per day. She continues to complain of exertional dyspnea. She has rare episodes of substernal chest tightness mostly at rest.    Allergies  Allergen Reactions  . Clindamycin/Lincomycin Hives  . Pseudoephedrine Hcl Other (See Comments)    Dizziness      Current Outpatient Prescriptions on File Prior to Visit  Medication Sig Dispense Refill  . Cholecalciferol (VITAMIN D3) 50000 UNITS CAPS Take 1 capsule by mouth once a week.    . escitalopram (LEXAPRO) 20 MG tablet Take 20 mg by mouth daily.     . folic acid (FOLVITE) 1 MG tablet Take 2 tablets by  mouth daily.    . hydrochlorothiazide (HYDRODIURIL) 25 MG tablet Take 1 tablet (25 mg total) by mouth daily. 30 tablet 3  . hydroxychloroquine (PLAQUENIL) 200 MG tablet Take 1 tablet by mouth 2 (two) times daily.    . metFORMIN (GLUCOPHAGE) 500 MG tablet Take 1 tablet (500 mg total) by mouth 2 (two) times daily. 60 tablet 3  . metoprolol (LOPRESSOR) 50 MG tablet Take 0.5 tablets (25 mg total) by mouth 2 (two) times daily. 60 tablet 5  . OTREXUP 20 MG/0.4ML SOAJ Inject 1 Syringe into the muscle once a week.    . Triamcinolone Acetonide (TRIAMCINOLONE 0.1 % CREAM : EUCERIN) CREA Apply 1 application topically as needed.    . valsartan (DIOVAN) 320 MG tablet Take 1 tablet (320 mg total) by mouth daily. 30 tablet 5   No current facility-administered medications on file prior to visit.     Past Medical History  Diagnosis Date  . Hypertension   . Lupus (systemic lupus erythematosus)   . Rheumatoid arthritis   . Raynaud's phenomenon   . Fibromyalgia   . Sjogren's disease   . Diabetes mellitus without complication metformin  . Mitral regurgitation     a. 05/2014 echo: EF >55%, nl LV/RV sys fxn, mild MR/AR, no valvular stenosis   . Atypical chest pain     a. ETT 05/2014: normal stress test without evidence of myocardial ischemia or chest pain at peak stress  . Tobacco abuse     a. ongoing; b. ongoing SOB  .  Obesity (BMI 30-39.9)   . Anxiety      Past Surgical History  Procedure Laterality Date  . Abdominal hysterectomy    . Cesarean section      x3     Family History  Problem Relation Age of Onset  . Hypertension Mother   . Valvular heart disease Mother   . CAD Father 15    CABG x 4  . Liver cancer Father 39    passed  . CAD Paternal Aunt     CABG  . CAD Paternal Aunt     CABG  . CAD Paternal Aunt     CABG  . CAD Paternal Aunt     CABG  . CAD Paternal Aunt     CABG  . CAD Paternal Uncle     MI s/p CABG  . Stroke Maternal Grandmother   . Stroke Maternal Grandfather     . CAD Maternal Grandfather      Social History   Social History  . Marital Status: Married    Spouse Name: N/A  . Number of Children: N/A  . Years of Education: N/A   Occupational History  . Not on file.   Social History Main Topics  . Smoking status: Former Smoker -- 0.50 packs/day    Types: Cigarettes    Quit date: 07/22/2015  . Smokeless tobacco: Never Used  . Alcohol Use: No  . Drug Use: No  . Sexual Activity: Not on file   Other Topics Concern  . Not on file   Social History Narrative   Married.   3 children.   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      PHYSICAL EXAM   BP 130/82 mmHg  Pulse 77  Ht 5\' 7"  (1.702 m)  Wt 216 lb 8 oz (98.204 kg)  BMI 33.90 kg/m2 Constitutional: She is oriented to person, place, and time. She appears well-developed and well-nourished. No distress.  HENT: No nasal discharge.  Head: Normocephalic and atraumatic.  Eyes: Pupils are equal and round. No discharge.  Neck: Normal range of motion. Neck supple. No JVD present. No thyromegaly present.  Cardiovascular: Normal rate, regular rhythm, normal heart sounds. Exam reveals no gallop and no friction rub. No murmur heard.  Pulmonary/Chest: Effort normal and breath sounds normal. No stridor. No respiratory distress. She has no wheezes. She has no rales. She exhibits no tenderness.  Abdominal: Soft. Bowel sounds are normal. She exhibits no distension. There is no tenderness. There is no rebound and no guarding.  Musculoskeletal: Normal range of motion. She exhibits no edema and no tenderness.  Neurological: She is alert and oriented to person, place, and time. Coordination normal.  Skin: Skin is warm and dry. No rash noted. She is not diaphoretic. No erythema. No pallor.  Psychiatric: She has a normal mood and affect. Her behavior is normal. Judgment and thought content normal.     DZH:GDJME  Rhythm  Voltage criteria for LVH  (R(I)+S(III)  exceeds 2.50 mV)  -Voltage criteria w/o ST/T abnormality may be normal.   -Nonspecific ST depression  -Nondiagnostic.   ABNORMAL    ASSESSMENT AND PLAN

## 2015-08-29 NOTE — Assessment & Plan Note (Signed)
Given that she is diabetic, we should have a low threshold for treatment with a statin. I requested fasting lipid and liver profile.

## 2015-09-02 ENCOUNTER — Other Ambulatory Visit: Payer: 59

## 2015-09-03 ENCOUNTER — Ambulatory Visit: Payer: 59 | Admitting: Primary Care

## 2015-09-04 ENCOUNTER — Encounter: Payer: 59 | Admitting: Cardiovascular Disease

## 2015-09-16 ENCOUNTER — Telehealth: Payer: Self-pay | Admitting: Primary Care

## 2015-09-16 NOTE — Telephone Encounter (Signed)
Please notify [name] that I cannot prescribe her pain medication without an evaluation. If she's in that much pain she should be evaluated in the emergency department. Thanks.

## 2015-09-16 NOTE — Telephone Encounter (Signed)
Pt has ra and hip pain, pain management giver her pain meds. Pain clinic will not return call.   Pt is in pain and wants to know what to do next.  Pt in pain and needs help getting in and out of car, can not drive, is crawling to bathroom.  cb number is [phone]

## 2015-09-17 ENCOUNTER — Ambulatory Visit (INDEPENDENT_AMBULATORY_CARE_PROVIDER_SITE_OTHER)
Admission: RE | Admit: 2015-09-17 | Discharge: 2015-09-17 | Disposition: A | Discharge status: Home / Self Care | Payer: 59 | Source: Ambulatory Visit | Attending: Primary Care | Admitting: Primary Care

## 2015-09-17 ENCOUNTER — Ambulatory Visit (INDEPENDENT_AMBULATORY_CARE_PROVIDER_SITE_OTHER): Payer: 59 | Admitting: Primary Care

## 2015-09-17 ENCOUNTER — Encounter: Payer: Self-pay | Admitting: Primary Care

## 2015-09-17 VITALS — BP 134/84 | HR 64 | Temp 97.5°F | Ht 67.0 in | Wt 222.8 lb

## 2015-09-17 DIAGNOSIS — M5441 Lumbago with sciatica, right side: Secondary | ICD-10-CM

## 2015-09-17 DIAGNOSIS — I10 Essential (primary) hypertension: Secondary | ICD-10-CM

## 2015-09-17 DIAGNOSIS — R21 Rash and other nonspecific skin eruption: Secondary | ICD-10-CM | POA: Diagnosis not present

## 2015-09-17 DIAGNOSIS — E119 Type 2 diabetes mellitus without complications: Secondary | ICD-10-CM

## 2015-09-17 DIAGNOSIS — M25551 Pain in right hip: Secondary | ICD-10-CM

## 2015-09-17 DIAGNOSIS — M069 Rheumatoid arthritis, unspecified: Secondary | ICD-10-CM

## 2015-09-17 MED ORDER — PREDNISONE 20 MG PO TABS: ORAL_TABLET | ORAL | Status: DC

## 2015-09-17 MED ORDER — NYSTATIN 100000 UNIT/GM EX CREA: 1.0000 "application " | TOPICAL_CREAM | Freq: Two times a day (BID) | CUTANEOUS | Status: DC

## 2015-09-17 NOTE — Progress Notes (Signed)
Subjective:    Patient ID: Lauren Winters, female    DOB: January 02, 1966, 49 y.o.   MRN: [id number]  HPI  [name] is a 49 year old female who presents today with a chief complaint of hip and back pain, and also for follow up of chronic conditions.  She has a history of RA and has been experiencing pain to her hips and lower back. She was sent to pain management and evaluted on September 8th. She was prescribed with narcotics which have not helped to reduce her pain. Thursday last week she woke up with pain to her right hip with gradual radiation down her right leg. This past Sunday she developed numbness/tinlging that is radiating down right leg into her toes. Her pain is aggravated by movement (especially with sitting down, standing, and walking) and is improved after she gets into a certain position without movement. She denies recent injury, loss of bowel or bladder, numbness to groin area.   2) Essential Hypertension: Currently managed on HCTZ 25 mg, valsartan 320 mg and metoprolol 50 mg BID. Denies chest pain, SOB, dizziness, headaches. She has has headaches with recent ingestion of tramadol, but no headaches prior to this medication.   3) Diabetes Type 2: Currently managed on Metformin 500 mg BID. She started "[employer]" diet plan. She's been on a trip to [place] recently and didn't do well with her eating.   Her diet currently consists of: Breakfast: Egg whites, some toast with cheese, or smoothie Lunch: Salads with ham or bacon, cheese, eggs. Hamburger without the bun. Dinner: [place] burgers, casseroles with [place], vegetables Snacks: Fruit, smoothie Desserts: Limited. Beverages: Water, diet mountain dew.  4) Rash: Located under right breast and has been present for the past 3 weeks. She's taken diflucan for the past 2 weeks (provided by her rheumatologist) and applying baby powder. Overall no improvement in her rash. Denies itching. Rash is uncomfortable.  Review of  Systems  Respiratory: Negative for shortness of breath.   Cardiovascular: Negative for chest pain.  Musculoskeletal: Positive for back pain, arthralgias and gait problem. Negative for myalgias.  Skin: Positive for rash.  Neurological: Positive for numbness. Negative for dizziness and headaches.       Past Medical History  Diagnosis Date  . Hypertension   . Lupus (systemic lupus erythematosus)   . Rheumatoid arthritis   . Raynaud's phenomenon   . Fibromyalgia   . Sjogren's disease   . Diabetes mellitus without complication metformin  . Mitral regurgitation     a. 05/2014 echo: EF >55%, nl LV/RV sys fxn, mild MR/AR, no valvular stenosis   . Atypical chest pain     a. ETT 05/2014: normal stress test without evidence of myocardial ischemia or chest pain at peak stress  . Tobacco abuse     a. ongoing; b. ongoing SOB  . Obesity (BMI 30-39.9)   . Anxiety     Social History   Social History  . Marital Status: Married    Spouse Name: N/A  . Number of Children: N/A  . Years of Education: N/A   Occupational History  . Not on file.   Social History Main Topics  . Smoking status: Former Smoker -- 0.50 packs/day    Types: Cigarettes    Quit date: 07/22/2015  . Smokeless tobacco: Never Used  . Alcohol Use: No  . Drug Use: No  . Sexual Activity: Not on file   Other Topics Concern  . Not on file  Social History Narrative   Married.   3 children.   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     Past Surgical History  Procedure Laterality Date  . Abdominal hysterectomy    . Cesarean section      x3    Family History  Problem Relation Age of Onset  . Hypertension Mother   . Valvular heart disease Mother   . CAD Father 29    CABG x 4  . Liver cancer Father 77    passed  . CAD Paternal Aunt     CABG  . CAD Paternal Aunt     CABG  . CAD Paternal Aunt     CABG  . CAD Paternal Aunt     CABG  . CAD Paternal Aunt     CABG  . CAD  Paternal Uncle     MI s/p CABG  . Stroke Maternal Grandmother   . Stroke Maternal Grandfather   . CAD Maternal Grandfather     Allergies  Allergen Reactions  . Clindamycin/Lincomycin Hives  . Pseudoephedrine Hcl Other (See Comments)    Dizziness     Current Outpatient Prescriptions on File Prior to Visit  Medication Sig Dispense Refill  . Cholecalciferol (VITAMIN D3) 50000 UNITS CAPS Take 1 capsule by mouth once a week.    . escitalopram (LEXAPRO) 20 MG tablet Take 20 mg by mouth daily.     . folic acid (FOLVITE) 1 MG tablet Take 2 tablets by mouth daily.    . hydrochlorothiazide (HYDRODIURIL) 25 MG tablet Take 1 tablet (25 mg total) by mouth daily. 30 tablet 3  . hydroxychloroquine (PLAQUENIL) 200 MG tablet Take 1 tablet by mouth 2 (two) times daily.    . metFORMIN (GLUCOPHAGE) 500 MG tablet Take 1 tablet (500 mg total) by mouth 2 (two) times daily. 60 tablet 3  . metoprolol (LOPRESSOR) 50 MG tablet Take 0.5 tablets (25 mg total) by mouth 2 (two) times daily. 60 tablet 5  . OTREXUP 20 MG/0.4ML SOAJ Inject 1 Syringe into the muscle once a week.    . Triamcinolone Acetonide (TRIAMCINOLONE 0.1 % CREAM : EUCERIN) CREA Apply 1 application topically as needed.    . valsartan (DIOVAN) 320 MG tablet Take 1 tablet (320 mg total) by mouth daily. 30 tablet 5   No current facility-administered medications on file prior to visit.    BP 134/84 mmHg  Pulse 64  Temp(Src) 97.5 F (36.4 C) (Oral)  Ht 5\' 7"  (1.702 m)  Wt 222 lb 12.8 oz (101.061 kg)  BMI 34.89 kg/m2  SpO2 98%    Objective:   Physical Exam  Constitutional: She appears well-nourished.  Cardiovascular: Normal rate and regular rhythm.   Pulmonary/Chest: Effort normal and breath sounds normal.  Musculoskeletal:       Right hip: She exhibits decreased range of motion. She exhibits normal strength, no tenderness and no deformity.       Left hip: She exhibits decreased range of motion. She exhibits normal strength, no tenderness  and no deformity.       Lumbar back: She exhibits decreased range of motion and pain. She exhibits no tenderness.  Positive straight leg raise to right lower extremity. Pain with hip flexion bilaterally.   Skin: Skin is warm and dry.  Moderate sized fungal appearing rash under right breast without breakdown. Tender with erythema.          Assessment & Plan:  Hip and back pain:  Present to right hip with radiation down right leg with radiculopathy. No recent injury, saddle anesthesia. Positive right straight leg raise. Pain with hip flexion bilaterally. RX for prednisone taper today, xrays today. Suspect flare up of RA. She will follow up with pain management next week and her rheumatologist soon.  Rash:  Located under right breast, appears to be fungal in nature. RX for Nystatin cream BID. Keep clean and dry, follow up PRN.

## 2015-09-17 NOTE — Patient Instructions (Addendum)
Apply Nystatin cream to affected area twice daily.  Start prednisone daily for hip and back pain. Take 3 tablets by mouth for 3 days, then 2 tablets for 3 days, then 1 tablet for 3 days.  Continue Metformin for diabetes. Continue hydrochlorothiazide, valsartan, and metoprolol for blood pressure.  Complete xray(s) prior to leaving today. I will contact you regarding your results.  Follow up with your rheumatologist and also with pain management.  Please schedule a follow up appointment in 3 months.  It was a pleasure to see you today!

## 2015-09-17 NOTE — Telephone Encounter (Signed)
Patient called back. Notified patient of [name]'s comments. Patient stated that she does not want any more pain meds. She would like to get x-ray or something into this pain she is having. So patient have appt today 09/17/15 with Allie Bossier.

## 2015-09-17 NOTE — Assessment & Plan Note (Signed)
Stable on current regimen. Continue same. 

## 2015-09-17 NOTE — Assessment & Plan Note (Signed)
Decreased to 5.9. Continue Metformin 500 mg BID. Encouraged healthy diet and exercise in order for weight loss and control of blood glucose. Follow up in 3 months for re-evaluation.

## 2015-09-17 NOTE — Progress Notes (Signed)
Pre visit review using our clinic review tool, if applicable. No additional management support is needed unless otherwise documented below in the visit note. 

## 2015-09-17 NOTE — Assessment & Plan Note (Signed)
Managed by rheumatology and also by pain management. Currently with increased right hip and low back pain today with radiculopathy. Will obtain xrays, suspect this is a flare up. She attempted to contact her rheumatologist yesterday without response. RX for prednisone provided today. She is to follow up with pain management next week and rheumatologist soon.

## 2015-09-17 NOTE — Telephone Encounter (Addendum)
I left a message for the patient to return my call.  Tries calling patient on 09/16/15 and 09/17/15.

## 2015-09-18 ENCOUNTER — Ambulatory Visit: Payer: 59 | Admitting: Internal Medicine

## 2015-09-19 ENCOUNTER — Ambulatory Visit: Payer: 59 | Admitting: Primary Care

## 2015-09-22 ENCOUNTER — Encounter: Payer: Self-pay | Admitting: Primary Care

## 2015-09-23 ENCOUNTER — Encounter: Payer: Self-pay | Admitting: Primary Care

## 2015-09-24 ENCOUNTER — Ambulatory Visit: Payer: Self-pay | Admitting: Family Medicine

## 2015-09-25 ENCOUNTER — Other Ambulatory Visit: Payer: Self-pay | Admitting: Pain Medicine

## 2015-09-25 DIAGNOSIS — M545 Low back pain: Secondary | ICD-10-CM

## 2015-09-29 ENCOUNTER — Ambulatory Visit
Admission: RE | Admit: 2015-09-29 | Discharge: 2015-09-29 | Disposition: A | Discharge status: Home / Self Care | Payer: 59 | Source: Ambulatory Visit | Attending: Pain Medicine | Admitting: Pain Medicine

## 2015-09-29 DIAGNOSIS — M545 Low back pain: Secondary | ICD-10-CM

## 2015-09-30 ENCOUNTER — Encounter: Payer: Self-pay | Admitting: Primary Care

## 2015-09-30 ENCOUNTER — Telehealth: Payer: Self-pay | Admitting: Primary Care

## 2015-09-30 NOTE — Telephone Encounter (Signed)
I need records from rheumatology and pain management. She will need to sign a medical release form, will you please ensure this happens? Also, I printed a new work note for her and placed it in your inbox. Will you fax? Thanks.

## 2015-10-01 NOTE — Telephone Encounter (Signed)
Patient came in and fill out forms. Awaiting for records from rheumatology and pain management.

## 2015-10-01 NOTE — Telephone Encounter (Signed)
Already faxed work note this morning. Awaiting for patient to come sign the medical release form.

## 2015-10-02 ENCOUNTER — Encounter: Payer: Self-pay | Admitting: *Deleted

## 2015-10-02 DIAGNOSIS — E119 Type 2 diabetes mellitus without complications: Secondary | ICD-10-CM | POA: Insufficient documentation

## 2015-10-02 DIAGNOSIS — R350 Frequency of micturition: Secondary | ICD-10-CM | POA: Diagnosis present

## 2015-10-02 DIAGNOSIS — N390 Urinary tract infection, site not specified: Secondary | ICD-10-CM | POA: Insufficient documentation

## 2015-10-02 DIAGNOSIS — Z7952 Long term (current) use of systemic steroids: Secondary | ICD-10-CM | POA: Diagnosis not present

## 2015-10-02 DIAGNOSIS — Z79899 Other long term (current) drug therapy: Secondary | ICD-10-CM | POA: Insufficient documentation

## 2015-10-02 DIAGNOSIS — Z87891 Personal history of nicotine dependence: Secondary | ICD-10-CM | POA: Insufficient documentation

## 2015-10-02 DIAGNOSIS — I10 Essential (primary) hypertension: Secondary | ICD-10-CM | POA: Insufficient documentation

## 2015-10-02 LAB — URINALYSIS COMPLETE WITH MICROSCOPIC (ARMC ONLY)
Bilirubin Urine: NEGATIVE
GLUCOSE, UA: NEGATIVE mg/dL
Hgb urine dipstick: NEGATIVE
KETONES UR: NEGATIVE mg/dL
NITRITE: NEGATIVE
Protein, ur: 30 mg/dL — AB
SPECIFIC GRAVITY, URINE: 1.026 (ref 1.005–1.030)
pH: 5 (ref 5.0–8.0)

## 2015-10-02 NOTE — ED Notes (Signed)
Pt states her rheumatologist called today and said she had bacteria, high WBC and blood in her urine from lab work she had done yesterday for routine check up. Pt denies any related symptoms.

## 2015-10-03 ENCOUNTER — Telehealth: Payer: Self-pay | Admitting: Primary Care

## 2015-10-03 ENCOUNTER — Telehealth: Payer: Self-pay

## 2015-10-03 ENCOUNTER — Emergency Department
Admission: EM | Admit: 2015-10-03 | Discharge: 2015-10-03 | Disposition: A | Discharge status: Home / Self Care | Payer: 59 | Attending: Emergency Medicine | Admitting: Emergency Medicine

## 2015-10-03 ENCOUNTER — Encounter: Payer: Self-pay | Admitting: Primary Care

## 2015-10-03 DIAGNOSIS — N390 Urinary tract infection, site not specified: Secondary | ICD-10-CM

## 2015-10-03 MED ORDER — CEPHALEXIN 500 MG PO CAPS
ORAL_CAPSULE | ORAL | Status: AC
Start: 2015-10-03 — End: 2015-10-03
  Administered 2015-10-03: 500 mg via ORAL
  Filled 2015-10-03: qty 1

## 2015-10-03 MED ORDER — CEPHALEXIN 500 MG PO CAPS: 500.0000 mg | ORAL_CAPSULE | Freq: Three times a day (TID) | ORAL | Status: AC

## 2015-10-03 MED ORDER — CEPHALEXIN 500 MG PO CAPS
500.0000 mg | ORAL_CAPSULE | Freq: Once | ORAL | Status: AC
  Administered 2015-10-03 (×2): 500 mg via ORAL

## 2015-10-03 NOTE — Telephone Encounter (Signed)
Pt aware paperwork had been faxed to [employer]  Copy for pt Copy for scan Copy for file

## 2015-10-03 NOTE — Telephone Encounter (Signed)
Completed short term disability paperwork and handed to Shirlean Mylar who will fax and contact patient.

## 2015-10-03 NOTE — Telephone Encounter (Signed)
[employer] short term disability claim form In kate's IN BOX For review and signature

## 2015-10-03 NOTE — Telephone Encounter (Signed)
Pt seen [facility] ED on 10/03/15.

## 2015-10-03 NOTE — Discharge Instructions (Signed)

## 2015-10-03 NOTE — ED Provider Notes (Signed)
[facility] Emergency Department Provider Note  ____________________________________________  Time seen: Approximately 1215 AM  I have reviewed the triage vital signs and the nursing notes.   HISTORY  Chief Complaint No chief complaint on file.    HPI Lauren Winters is a 49 y.o. female with a history of lupus on plaque [place] as well as methotrexate is presenting today with urinary burning over the past several days. She says that she is also having frequency. She denies any history of urinary tract infections. Michela Pitcher that she was called by her rheumatologist to go to the emergency department immediately because of a positive result on a recent urinalysis. She denies any fever, back pain. Says that she does have some mild pressure to her lower abdomen. Denies any new medications or medication changes. Unclear cause of the urinary tract infection. Denies any recent diarrhea.   Past Medical History  Diagnosis Date  . Hypertension   . Lupus (systemic lupus erythematosus) ([place])   . Rheumatoid arthritis ([place])   . Raynaud's phenomenon   . Fibromyalgia   . Sjogren's disease ([place])   . Diabetes mellitus without complication (HCC) metformin  . Mitral regurgitation     a. 05/2014 echo: EF >55%, nl LV/RV sys fxn, mild MR/AR, no valvular stenosis   . Atypical chest pain     a. ETT 05/2014: normal stress test without evidence of myocardial ischemia or chest pain at peak stress  . Tobacco abuse     a. ongoing; b. ongoing SOB  . Obesity (BMI 30-39.9)   . Anxiety     Patient Active Problem List   Diagnosis Date Noted  . Hyperlipidemia 08/29/2015  . Lupus (systemic lupus erythematosus) ([place]) 08/04/2015  . Rheumatoid arthritis ([place]) 08/04/2015  . Sjogren's syndrome ([place]) 08/04/2015  . Mitral regurgitation   . Tobacco abuse   . Atypical chest pain   . Obesity (BMI 30-39.9)   . Anxiety   . Hypertension   . Diabetes mellitus without complication [facility])     Past  Surgical History  Procedure Laterality Date  . Abdominal hysterectomy    . Cesarean section      x3    Current Outpatient Rx  Name  Route  Sig  Dispense  Refill  . celecoxib (CELEBREX) 200 MG capsule   Oral   Take 200 mg by mouth daily as needed.         . Cholecalciferol (VITAMIN D3) 50000 UNITS CAPS   Oral   Take 1 capsule by mouth once a week.         . escitalopram (LEXAPRO) 20 MG tablet   Oral   Take 20 mg by mouth daily.          . folic acid (FOLVITE) 1 MG tablet   Oral   Take 2 tablets by mouth daily.         . hydrochlorothiazide (HYDRODIURIL) 25 MG tablet   Oral   Take 1 tablet (25 mg total) by mouth daily.   30 tablet   3   . hydroxychloroquine (PLAQUENIL) 200 MG tablet   Oral   Take 1 tablet by mouth 2 (two) times daily.         . metFORMIN (GLUCOPHAGE) 500 MG tablet   Oral   Take 1 tablet (500 mg total) by mouth 2 (two) times daily.   60 tablet   3   . metoprolol (LOPRESSOR) 50 MG tablet   Oral   Take 0.5 tablets (25  mg total) by mouth 2 (two) times daily.   60 tablet   5   . nystatin cream (MYCOSTATIN)   Topical   Apply 1 application topically 2 (two) times daily.   30 g   0   . OTREXUP 20 MG/0.4ML SOAJ   Intramuscular   Inject 1 Syringe into the muscle once a week.           Dispense as written.   . predniSONE (DELTASONE) 20 MG tablet      Take 3 tablets by mouth for 3 days, then 2 tablets by mouth for 3 days, then 1 tablet by mouth for 3 days.   18 tablet   0   . traMADol (ULTRAM) 50 MG tablet   Oral   Take 50 mg by mouth 3 (three) times daily.         . Triamcinolone Acetonide (TRIAMCINOLONE 0.1 % CREAM : EUCERIN) CREA   Topical   Apply 1 application topically as needed.         . valsartan (DIOVAN) 320 MG tablet   Oral   Take 1 tablet (320 mg total) by mouth daily.   30 tablet   5     Allergies Clindamycin/lincomycin and Pseudoephedrine hcl  Family History  Problem Relation Age of Onset  .  Hypertension Mother   . Valvular heart disease Mother   . CAD Father 66    CABG x 4  . Liver cancer Father 15    passed  . CAD Paternal Aunt     CABG  . CAD Paternal Aunt     CABG  . CAD Paternal Aunt     CABG  . CAD Paternal Aunt     CABG  . CAD Paternal Aunt     CABG  . CAD Paternal Uncle     MI s/p CABG  . Stroke Maternal Grandmother   . Stroke Maternal Grandfather   . CAD Maternal Grandfather     Social History Social History  Substance Use Topics  . Smoking status: Former Smoker -- 0.50 packs/day    Types: Cigarettes    Quit date: 07/22/2015  . Smokeless tobacco: Never Used  . Alcohol Use: No    Review of Systems Constitutional: No fever/chills Eyes: No visual changes. ENT: No sore throat. Cardiovascular: Denies chest pain. Respiratory: Denies shortness of breath. Gastrointestinal: No abdominal pain.  No nausea, no vomiting.  No diarrhea.  No constipation. Genitourinary: As above  Musculoskeletal: Negative for back pain. Skin: Negative for rash. Neurological: Negative for headaches, focal weakness or numbness.  10-point ROS otherwise negative.  ____________________________________________   PHYSICAL EXAM:  VITAL SIGNS: ED Triage Vitals  Enc Vitals Group     BP 10/02/15 1912 195/89 mmHg     Pulse Rate 10/02/15 1912 72     Resp 10/02/15 1912 18     Temp 10/02/15 1912 98.5 F (36.9 C)     Temp Source 10/02/15 1912 Oral     SpO2 10/02/15 1912 98 %     Weight 10/02/15 1912 219 lb (99.338 kg)     Height 10/02/15 1912 5\' 7"  (1.702 m)     Head Cir --      Peak Flow --      Pain Score 10/02/15 1913 0     Pain Loc --      Pain Edu? --      Excl. in [place]? --     Constitutional: Alert and oriented. Well appearing and in  no acute distress. Eyes: Conjunctivae are normal. PERRL. EOMI. Head: Atraumatic. Nose: No congestion/rhinnorhea. Mouth/Throat: Mucous membranes are moist.  Oropharynx non-erythematous. Neck: No stridor.   Cardiovascular: Normal  rate, regular rhythm. Grossly normal heart sounds.  Good peripheral circulation. Respiratory: Normal respiratory effort.  No retractions. Lungs CTAB. Gastrointestinal: Soft with mild suprapubic tenderness palpation. There is no tenderness over McBurney's point. There is no rebound or guarding.. No distention. No abdominal bruits. No CVA tenderness. Musculoskeletal: No lower extremity tenderness nor edema.  No joint effusions. Neurologic:  Normal speech and language. No gross focal neurologic deficits are appreciated. No gait instability. Skin:  Skin is warm, dry and intact. No rash noted. Psychiatric: Mood and affect are normal. Speech and behavior are normal.  ____________________________________________   LABS (all labs ordered are listed, but only abnormal results are displayed)  Labs Reviewed  URINALYSIS COMPLETEWITH MICROSCOPIC ([place]) - Abnormal; Notable for the following:    Color, Urine YELLOW (*)    APPearance HAZY (*)    Protein, ur 30 (*)    Leukocytes, UA 2+ (*)    Bacteria, UA RARE (*)    Squamous Epithelial / LPF 0-5 (*)    All other components within normal limits  URINE CULTURE   ____________________________________________  EKG   ____________________________________________  RADIOLOGY   ____________________________________________   PROCEDURES    ____________________________________________   INITIAL IMPRESSION / ASSESSMENT AND PLAN / ED COURSE  Pertinent labs & imaging results that were available during my care of the patient were reviewed by me and considered in my medical decision making (see chart for details).  Patient with history and physical exam consistent with urinary tract infection. We'll treat with Keflex. No previous culture results available for specific directing of antibiotics. Discussed lab results with the patient as well as the plan and the patient is one to comply. ____________________________________________   FINAL  CLINICAL IMPRESSION(S) / ED DIAGNOSES  Acute urinary tract infection. Initial visit.    Orbie Pyo, MD 10/03/15 [phone]

## 2015-10-03 NOTE — Telephone Encounter (Signed)
PLEASE NOTE: All timestamps contained within this report are represented as [place] Standard Time. CONFIDENTIALTY NOTICE: This fax transmission is intended only for the addressee. It contains information that is legally privileged, confidential or otherwise protected from use or disclosure. If you are not the intended recipient, you are strictly prohibited from reviewing, disclosing, copying using or disseminating any of this information or taking any action in reliance on or regarding this information. If you have received this fax in error, please notify us immediately by telephone so that we can arrange for its return to [place]. Phone: [phone], Toll-Free: [phone], Fax: [phone] Page: 1 of 2 Call Id: [id number] [place] Patient Name: Lauren Winters Gender: Female DOB: 11-02-1966 Age: 49 Y 58 M 26 D Return Phone Number: [phone] (Primary) Address: City/State/Zip: [place] Client [place] Night - Client Client Site [place] Physician Alma Friendly Contact Type Call Call Type Triage / Clinical Relationship To Patient Self Return Phone Number [phone] (Primary) Chief Complaint Urine, Blood In Initial Comment Caller states that rheumatologist stated that urine blood and an extremely high level of bacteria in urine and that needed to get treated tonight. wants advice. PreDisposition Go to Urgent Care/Walk-In Clinic Nurse Assessment Nurse: Windle Guard, RN, Lesa Date/Time Eilene Ghazi Time): 10/02/2015 5:25:04 PM Confirm and document reason for call. If symptomatic, describe symptoms. ---Caller states she has a U&A yesterday at her Rheumatologist. She has been told that she has blood in her urine and a large amount of bacteria. Has the patient traveled out of the country within the last 30 days? ---No Does the patient have any new  or worsening symptoms? ---Yes Will a triage be completed? ---Yes Related visit to physician within the last 2 weeks? ---Yes Does the PT have any chronic conditions? (i.e. diabetes, asthma, etc.) ---Yes List chronic conditions. ---Lupus, RA, Sjogren's, Raynauds disease Did the patient indicate they were pregnant? ---No Guidelines Guideline Title Affirmed Question Affirmed Notes Nurse Date/Time (Eastern Time) Urine - Blood In Patient sounds very sick or weak to the triager Windle Guard, RN, Emmaline Kluver 10/02/2015 5:27:35 PM Disp. Time Eilene Ghazi Time) Disposition Final User 10/02/2015 5:29:41 PM Go to ED Now (or PCP triage) Yes Conner, RN, Emmaline Kluver Caller Understands: Yes Disagree/Comply: Comply PLEASE NOTE: All timestamps contained within this report are represented as [place] Standard Time. CONFIDENTIALTY NOTICE: This fax transmission is intended only for the addressee. It contains information that is legally privileged, confidential or otherwise protected from use or disclosure. If you are not the intended recipient, you are strictly prohibited from reviewing, disclosing, copying using or disseminating any of this information or taking any action in reliance on or regarding this information. If you have received this fax in error, please notify us immediately by telephone so that we can arrange for its return to [place]. Phone: [phone], Toll-Free: [phone], Fax: [phone] Page: 2 of 2 Call Id: [id number] Care Advice Given Per Guideline GO TO ED NOW (OR PCP TRIAGE): * IF NO PCP TRIAGE: You need to be seen. Go to the [facility] at _____________ Hospital within the next hour. Leave as soon as you can. DRIVING: Another adult should drive. BRING MEDICINES: * It is also a good idea to bring the pill bottles too. This will help the doctor to make certain you are taking the right medicines and the right dose. CARE ADVICE given per Urine, Blood In (Adult) guideline. After Care Instructions Given  Call Event Type  User Date / Time Description Referrals GO TO FACILITY UNDECIDED

## 2015-10-03 NOTE — ED Notes (Signed)
MD at bedside for eval.

## 2015-10-05 LAB — URINE CULTURE

## 2015-10-06 NOTE — Telephone Encounter (Signed)
Records been received. Lauren Winters working on paper works.

## 2015-10-07 ENCOUNTER — Ambulatory Visit: Payer: 59 | Admitting: Internal Medicine

## 2015-10-08 ENCOUNTER — Encounter: Payer: Self-pay | Admitting: Primary Care

## 2015-10-10 ENCOUNTER — Encounter: Payer: Self-pay | Admitting: Primary Care

## 2015-10-10 ENCOUNTER — Ambulatory Visit (INDEPENDENT_AMBULATORY_CARE_PROVIDER_SITE_OTHER): Payer: 59 | Admitting: Primary Care

## 2015-10-10 VITALS — BP 126/84 | HR 60 | Temp 98.9°F | Ht 67.0 in | Wt 219.4 lb

## 2015-10-10 DIAGNOSIS — Z87440 Personal history of urinary (tract) infections: Secondary | ICD-10-CM

## 2015-10-10 NOTE — Progress Notes (Signed)
Subjective:    Patient ID: Lauren Winters, female    DOB: [birth year], 49 y.o.   MRN: [id number]  HPI  [name] is a 49 year old female who presents today for follow up of UTI. She was diagnosed with a UTI on October 5th per her Rheumatologist. During that appointment she had no urinary symptoms. She was placed on Keflex for a 10 days supply and was told to follow up with PCP for re-evaluation.   Since her diagnosis she denies dysuria, frequency, urgency, vaginal symptoms, fevers.   Review of Systems  Constitutional: Negative for fever and chills.  Genitourinary: Negative for dysuria, urgency, frequency, hematuria, flank pain, vaginal discharge and difficulty urinating.       Past Medical History  Diagnosis Date  . Hypertension   . Lupus (systemic lupus erythematosus) ([place])   . Rheumatoid arthritis ([place])   . Raynaud's phenomenon   . Fibromyalgia   . Sjogren's disease ([place])   . Diabetes mellitus without complication (HCC) metformin  . Mitral regurgitation     a. 05/2014 echo: EF >55%, nl LV/RV sys fxn, mild MR/AR, no valvular stenosis   . Atypical chest pain     a. ETT 05/2014: normal stress test without evidence of myocardial ischemia or chest pain at peak stress  . Tobacco abuse     a. ongoing; b. ongoing SOB  . Obesity (BMI 30-39.9)   . Anxiety     Social History   Social History  . Marital Status: Married    Spouse Name: N/A  . Number of Children: N/A  . Years of Education: N/A   Occupational History  . Not on file.   Social History Main Topics  . Smoking status: Former Smoker -- 0.50 packs/day    Types: Cigarettes    Quit date: 07/22/2015  . Smokeless tobacco: Never Used  . Alcohol Use: No  . Drug Use: No  . Sexual Activity: Not on file   Other Topics Concern  . Not on file   Social History Narrative   Married.   3 children.   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     Past Surgical History  Procedure  Laterality Date  . Abdominal hysterectomy    . Cesarean section      x3    Family History  Problem Relation Age of Onset  . Hypertension Mother   . Valvular heart disease Mother   . CAD Father 17    CABG x 4  . Liver cancer Father 50    passed  . CAD Paternal Aunt     CABG  . CAD Paternal Aunt     CABG  . CAD Paternal Aunt     CABG  . CAD Paternal Aunt     CABG  . CAD Paternal Aunt     CABG  . CAD Paternal Uncle     MI s/p CABG  . Stroke Maternal Grandmother   . Stroke Maternal Grandfather   . CAD Maternal Grandfather     Allergies  Allergen Reactions  . Clindamycin/Lincomycin Hives  . Pseudoephedrine Hcl Other (See Comments)    Dizziness     Current Outpatient Prescriptions on File Prior to Visit  Medication Sig Dispense Refill  . celecoxib (CELEBREX) 200 MG capsule Take 200 mg by mouth daily as needed.    . cephALEXin (KEFLEX) 500 MG capsule Take 1 capsule (500 mg total) by mouth 3 (three) times daily. 21 capsule  0  . Cholecalciferol (VITAMIN D3) 50000 UNITS CAPS Take 1 capsule by mouth once a week.    . escitalopram (LEXAPRO) 20 MG tablet Take 20 mg by mouth daily.     . folic acid (FOLVITE) 1 MG tablet Take 2 tablets by mouth daily.    . hydrochlorothiazide (HYDRODIURIL) 25 MG tablet Take 1 tablet (25 mg total) by mouth daily. 30 tablet 3  . hydroxychloroquine (PLAQUENIL) 200 MG tablet Take 1 tablet by mouth 2 (two) times daily.    . metFORMIN (GLUCOPHAGE) 500 MG tablet Take 1 tablet (500 mg total) by mouth 2 (two) times daily. 60 tablet 3  . metoprolol (LOPRESSOR) 50 MG tablet Take 0.5 tablets (25 mg total) by mouth 2 (two) times daily. 60 tablet 5  . nystatin cream (MYCOSTATIN) Apply 1 application topically 2 (two) times daily. 30 g 0  . OTREXUP 20 MG/0.4ML SOAJ Inject 1 Syringe into the muscle once a week.    . predniSONE (DELTASONE) 20 MG tablet Take 3 tablets by mouth for 3 days, then 2 tablets by mouth for 3 days, then 1 tablet by mouth for 3 days. 18  tablet 0  . traMADol (ULTRAM) 50 MG tablet Take 50 mg by mouth 3 (three) times daily.    . Triamcinolone Acetonide (TRIAMCINOLONE 0.1 % CREAM : EUCERIN) CREA Apply 1 application topically as needed.    . valsartan (DIOVAN) 320 MG tablet Take 1 tablet (320 mg total) by mouth daily. 30 tablet 5   No current facility-administered medications on file prior to visit.    BP 126/84 mmHg  Pulse 60  Temp(Src) 98.9 F (37.2 C) (Oral)  Ht 5\' 7"  (1.702 m)  Wt 219 lb 6.4 oz (99.519 kg)  BMI 34.35 kg/m2  SpO2 98%    Objective:   Physical Exam  Constitutional: She appears well-nourished.  Cardiovascular: Normal rate and regular rhythm.   Pulmonary/Chest: Effort normal and breath sounds normal.  Abdominal: There is no CVA tenderness.  Skin: Skin is warm and dry.          Assessment & Plan:  Urinary Tract Infection:  Follow up from diagnosis on 10/5 at rheumatologist office UA today negative. Continue Keflex as prescribed. Stay hydrated with water. Follow up PRN

## 2015-10-10 NOTE — Patient Instructions (Signed)
Your urine test did not show signs of infection.  Continue your antibiotics as prescribed.  Ensure that you are staying hydrated with water.  It was a pleasure to see you today!

## 2015-10-10 NOTE — Progress Notes (Signed)
Pre visit review using our clinic review tool, if applicable. No additional management support is needed unless otherwise documented below in the visit note. 

## 2015-10-21 ENCOUNTER — Encounter: Payer: Self-pay | Admitting: Internal Medicine

## 2015-10-21 ENCOUNTER — Encounter: Payer: Self-pay | Admitting: Primary Care

## 2015-10-21 ENCOUNTER — Ambulatory Visit (INDEPENDENT_AMBULATORY_CARE_PROVIDER_SITE_OTHER): Payer: 59 | Admitting: Internal Medicine

## 2015-10-21 VITALS — BP 110/68 | HR 67 | Ht 67.0 in | Wt 222.0 lb

## 2015-10-21 DIAGNOSIS — R0602 Shortness of breath: Secondary | ICD-10-CM | POA: Diagnosis not present

## 2015-10-21 MED ORDER — ALBUTEROL SULFATE HFA 108 (90 BASE) MCG/ACT IN AERS: 2.0000 | INHALATION_SPRAY | Freq: Four times a day (QID) | RESPIRATORY_TRACT | Status: DC | PRN

## 2015-10-21 MED ORDER — FLUTICASONE FUROATE-VILANTEROL 100-25 MCG/INH IN AEPB: 1.0000 | INHALATION_SPRAY | Freq: Every day | RESPIRATORY_TRACT | Status: DC

## 2015-10-21 MED ORDER — FLUTICASONE FUROATE-VILANTEROL 100-25 MCG/INH IN AEPB: 1.0000 | INHALATION_SPRAY | Freq: Every day | RESPIRATORY_TRACT | Status: AC

## 2015-10-21 NOTE — Patient Instructions (Signed)

## 2015-10-21 NOTE — Progress Notes (Signed)
[facility] Consultation      Date: 10/21/2015,   MRN# [id number] Lauren Winters 28-May-1966 Code Status:  Hosp day:@LENGTHOFSTAYDAYS @ Referring MD: @ATDPROV @     PCP:      AdmissionWeight: 222 lb (100.699 kg)                 CurrentWeight: 222 lb (100.699 kg) Lauren Winters is a 49 y.o. old female seen in consultation for intermittent SOb and cough and wheezing.    CHIEF COMPLAINT:   SOB, cough,wheezing   HISTORY OF PRESENT ILLNESS  49 yo white female seen today for SOB, DOE and cough with intermittent wheezing going on over last several months Patient had extensive cardiac work up for chest pain ECHO, stress test all WNL per patient Patient here today, sitting comfortably, no acute issues at this time.  Patient has no signs of infection at this time Patient had quit smoking several months ago, but supposedly had been smoking for approx 15-20 years 1/2 PPD Patient has occasional wheezing and coughing spells especially with exertion.   CT chest in 7/16 did not chow any acute findings    PAST MEDICAL HISTORY   Past Medical History  Diagnosis Date  . Hypertension   . Lupus (systemic lupus erythematosus) ([place])   . Rheumatoid arthritis ([place])   . Raynaud's phenomenon   . Fibromyalgia   . Sjogren's disease ([place])   . Diabetes mellitus without complication (HCC) metformin  . Mitral regurgitation     a. 05/2014 echo: EF >55%, nl LV/RV sys fxn, mild MR/AR, no valvular stenosis   . Atypical chest pain     a. ETT 05/2014: normal stress test without evidence of myocardial ischemia or chest pain at peak stress  . Tobacco abuse     a. ongoing; b. ongoing SOB  . Obesity (BMI 30-39.9)   . Anxiety      SURGICAL HISTORY   Past Surgical History  Procedure Laterality Date  . Abdominal hysterectomy    . Cesarean section      x3     FAMILY HISTORY   Family History  Problem Relation Age of Onset  . Hypertension Mother   . Valvular heart disease  Mother   . CAD Father 39    CABG x 4  . Liver cancer Father 34    passed  . CAD Paternal Aunt     CABG  . CAD Paternal Aunt     CABG  . CAD Paternal Aunt     CABG  . CAD Paternal Aunt     CABG  . CAD Paternal Aunt     CABG  . CAD Paternal Uncle     MI s/p CABG  . Stroke Maternal Grandmother   . Stroke Maternal Grandfather   . CAD Maternal Grandfather      SOCIAL HISTORY   Social History  Substance Use Topics  . Smoking status: Former Smoker -- 0.50 packs/day    Types: Cigarettes    Quit date: 07/22/2015  . Smokeless tobacco: Never Used  . Alcohol Use: No     MEDICATIONS    Home Medication:  Current Outpatient Rx  Name  Route  Sig  Dispense  Refill  . celecoxib (CELEBREX) 200 MG capsule   Oral   Take 200 mg by mouth daily as needed.         . Cholecalciferol (VITAMIN D3) 50000 UNITS CAPS   Oral   Take 1 capsule by mouth once a  week.         . escitalopram (LEXAPRO) 20 MG tablet   Oral   Take 20 mg by mouth daily.          . folic acid (FOLVITE) 1 MG tablet   Oral   Take 2 tablets by mouth daily.         . hydrochlorothiazide (HYDRODIURIL) 25 MG tablet   Oral   Take 1 tablet (25 mg total) by mouth daily.   30 tablet   3   . hydroxychloroquine (PLAQUENIL) 200 MG tablet   Oral   Take 1 tablet by mouth 2 (two) times daily.         . metFORMIN (GLUCOPHAGE) 500 MG tablet   Oral   Take 1 tablet (500 mg total) by mouth 2 (two) times daily.   60 tablet   3   . metoprolol (LOPRESSOR) 50 MG tablet   Oral   Take 0.5 tablets (25 mg total) by mouth 2 (two) times daily.   60 tablet   5   . nystatin cream (MYCOSTATIN)   Topical   Apply 1 application topically 2 (two) times daily.   30 g   0   . OTREXUP 20 MG/0.4ML SOAJ   Intramuscular   Inject 1 Syringe into the muscle once a week.           Dispense as written.   . traMADol (ULTRAM) 50 MG tablet   Oral   Take 50 mg by mouth 3 (three) times daily.         . Triamcinolone  Acetonide (TRIAMCINOLONE 0.1 % CREAM : EUCERIN) CREA   Topical   Apply 1 application topically as needed.         . valsartan (DIOVAN) 320 MG tablet   Oral   Take 1 tablet (320 mg total) by mouth daily.   30 tablet   5     Current Medication:  Current outpatient prescriptions:  .  celecoxib (CELEBREX) 200 MG capsule, Take 200 mg by mouth daily as needed., Disp: , Rfl:  .  Cholecalciferol (VITAMIN D3) 50000 UNITS CAPS, Take 1 capsule by mouth once a week., Disp: , Rfl:  .  escitalopram (LEXAPRO) 20 MG tablet, Take 20 mg by mouth daily. , Disp: , Rfl:  .  folic acid (FOLVITE) 1 MG tablet, Take 2 tablets by mouth daily., Disp: , Rfl:  .  hydrochlorothiazide (HYDRODIURIL) 25 MG tablet, Take 1 tablet (25 mg total) by mouth daily., Disp: 30 tablet, Rfl: 3 .  hydroxychloroquine (PLAQUENIL) 200 MG tablet, Take 1 tablet by mouth 2 (two) times daily., Disp: , Rfl:  .  metFORMIN (GLUCOPHAGE) 500 MG tablet, Take 1 tablet (500 mg total) by mouth 2 (two) times daily., Disp: 60 tablet, Rfl: 3 .  metoprolol (LOPRESSOR) 50 MG tablet, Take 0.5 tablets (25 mg total) by mouth 2 (two) times daily., Disp: 60 tablet, Rfl: 5 .  nystatin cream (MYCOSTATIN), Apply 1 application topically 2 (two) times daily., Disp: 30 g, Rfl: 0 .  OTREXUP 20 MG/0.4ML SOAJ, Inject 1 Syringe into the muscle once a week., Disp: , Rfl:  .  traMADol (ULTRAM) 50 MG tablet, Take 50 mg by mouth 3 (three) times daily., Disp: , Rfl:  .  Triamcinolone Acetonide (TRIAMCINOLONE 0.1 % CREAM : EUCERIN) CREA, Apply 1 application topically as needed., Disp: , Rfl:  .  valsartan (DIOVAN) 320 MG tablet, Take 1 tablet (320 mg total) by mouth daily., Disp: 30 tablet, Rfl:  5    ALLERGIES   Clindamycin/lincomycin and Pseudoephedrine hcl     REVIEW OF SYSTEMS   Review of Systems  Constitutional: Negative for fever, chills, weight loss and malaise/fatigue.  HENT: Positive for hearing loss.   Eyes: Negative for blurred vision.    Respiratory: Positive for cough, shortness of breath and wheezing.   Cardiovascular: Positive for chest pain. Negative for palpitations and orthopnea.  Gastrointestinal: Negative for heartburn, nausea and vomiting.  Musculoskeletal: Positive for back pain.  Skin: Negative for rash.  Neurological: Negative.  Negative for headaches.  Endo/Heme/Allergies: Negative.   Psychiatric/Behavioral: Negative for depression.     VS: BP 110/68 mmHg  Pulse 67  Ht 5\' 7"  (1.702 m)  Wt 222 lb (100.699 kg)  BMI 34.76 kg/m2  SpO2 97%     PHYSICAL EXAM  Physical Exam  Constitutional: She is oriented to person, place, and time. She appears well-developed and well-nourished. No distress.  HENT:  Head: Normocephalic and atraumatic.  Mouth/Throat: No oropharyngeal exudate.  Eyes: EOM are normal. Pupils are equal, round, and reactive to light. No scleral icterus.  Neck: Normal range of motion. Neck supple.  Cardiovascular: Normal rate, regular rhythm and normal heart sounds.   No murmur heard. Pulmonary/Chest: No stridor. No respiratory distress. She has no wheezes.  Abdominal: Soft. Bowel sounds are normal.  Musculoskeletal: Normal range of motion. She exhibits no edema.  Neurological: She is alert and oriented to person, place, and time.  Skin: Skin is warm. She is not diaphoretic.  Psychiatric: She has a normal mood and affect.       Mr Lumbar Spine Wo Contrast  09/29/2015  CLINICAL DATA:  Recent exacerbation of chronic pain. Bilateral low back and buttock pain. Right groin and anterior thigh pain. Right leg weakness. Numbness in the right toes. EXAM: MRI LUMBAR SPINE WITHOUT CONTRAST TECHNIQUE: Multiplanar, multisequence MR imaging of the lumbar spine was performed. No intravenous contrast was administered. COMPARISON:  Lumbar spine radiographs 09/17/2015. Report of MRI lumbar spine 01/10/2007 FINDINGS: Normal signal is present in the conus medullaris which terminates at L1, within normal  limits. Marrow signal, vertebral body heights, alignment are normal. Mild leftward curvature of the lumbar spine is centered at L2-3. Limited imaging of the abdomen is unremarkable. L1-2: Mild disc bulging is present without significant stenosis. L2-3: A mild broad-based disc protrusion is present. There is some distortion of the central canal without focal stenosis. Asymmetric right-sided facet hypertrophy is noted. L3-4: An asymmetric right-sided disc protrusion is present. Moderate facet hypertrophy is noted bilaterally. This results in moderate right mild left subarticular stenosis. A superior disc extrusion or more likely free fragment is present in the right lateral recess posterior to the L3 vertebral body, likely impacting the traversing right L3 nerve root. Mild right foraminal stenosis is present as well. The left foramen is patent. L4-5: A broad-based disc protrusion is asymmetric to the left. Mild subarticular narrowing is worse on the left. Mild foraminal narrowing is also worse on the left. L5-S1: A shallow central disc protrusion is present. Mild facet hypertrophy is noted bilaterally. The foramina are patent. IMPRESSION: 1. Prominent rightward disc protrusion at L3-4 with moderate right subarticular narrowing potentially affecting the right L4 nerve root. 2. Superior disc extrusion or more likely free fragment resulting and moderate right lateral recess stenosis posterior to the right L3 vertebral body, likely impacting the right L3 nerve root. 3. Mild subarticular and foraminal narrowing at L4-5, worse on the left. 4. Broad-based disc protrusion and  facet hypertrophy at L2-3 without significant stenosis. 5. Shallow central disc protrusion and mild facet hypertrophy at L5-S1 without significant stenosis. Electronically Signed   By: San Morelle M.D.   On: 09/29/2015 12:03         ASSESSMENT/PLAN   49 yo white female with SOB and intermittent coughing and wheezing with h/o tobacco  use-patient likely has underlying COPD  1.obtain PFT's with 6 MWT 2.start Breo-Ellipta 100/25 daily 3.albuterol inhaler as needed  Follow up in 3 months  I have personally obtained a history, examined the patient, evaluated laboratory and independently reviewed imaging results, formulated the assessment and plan and placed orders.  The Patient requires high complexity decision making for assessment and support, frequent evaluation and titration of therapies, application of advanced monitoring technologies and extensive interpretation of multiple databases.    Patient  satisfied with Plan of action and management. All questions answered  Corrin Parker, M.D.  [facility]  Medical Director [facility] Director [facility]io-P[facility] Department

## 2015-10-22 ENCOUNTER — Encounter: Payer: Self-pay | Admitting: Primary Care

## 2015-10-23 ENCOUNTER — Telehealth: Payer: Self-pay | Admitting: Primary Care

## 2015-10-23 ENCOUNTER — Other Ambulatory Visit: Payer: Self-pay | Admitting: Primary Care

## 2015-10-23 ENCOUNTER — Encounter: Payer: Self-pay | Admitting: Primary Care

## 2015-10-23 DIAGNOSIS — R238 Other skin changes: Secondary | ICD-10-CM

## 2015-10-23 DIAGNOSIS — R233 Spontaneous ecchymoses: Secondary | ICD-10-CM

## 2015-10-23 NOTE — Telephone Encounter (Signed)
Disability form  In [name] IN BOX For review and signature

## 2015-10-23 NOTE — Telephone Encounter (Signed)
Pt dropped off disability extension forms to be completed.

## 2015-10-24 DIAGNOSIS — Z0279 Encounter for issue of other medical certificate: Secondary | ICD-10-CM

## 2015-10-24 NOTE — Telephone Encounter (Signed)
Faxed paperwork to [employer]  Pt aware Copy for pt Copy for file Copy for scan Copy for billing

## 2015-10-27 ENCOUNTER — Encounter: Payer: Self-pay | Admitting: Primary Care

## 2015-10-27 ENCOUNTER — Telehealth: Payer: Self-pay | Admitting: Primary Care

## 2015-10-27 ENCOUNTER — Other Ambulatory Visit: Payer: Self-pay | Admitting: Primary Care

## 2015-10-27 ENCOUNTER — Other Ambulatory Visit (INDEPENDENT_AMBULATORY_CARE_PROVIDER_SITE_OTHER): Payer: 59

## 2015-10-27 DIAGNOSIS — M5442 Lumbago with sciatica, left side: Principal | ICD-10-CM

## 2015-10-27 DIAGNOSIS — R233 Spontaneous ecchymoses: Secondary | ICD-10-CM

## 2015-10-27 DIAGNOSIS — R238 Other skin changes: Secondary | ICD-10-CM | POA: Diagnosis not present

## 2015-10-27 DIAGNOSIS — M5441 Lumbago with sciatica, right side: Secondary | ICD-10-CM

## 2015-10-27 LAB — BASIC METABOLIC PANEL
BUN: 17 mg/dL (ref 6–23)
CHLORIDE: 103 meq/L (ref 96–112)
CO2: 28 meq/L (ref 19–32)
Calcium: 9.9 mg/dL (ref 8.4–10.5)
Creatinine, Ser: 0.75 mg/dL (ref 0.40–1.20)
GFR: 86.37 mL/min (ref >60.00)
GLUCOSE: 134 mg/dL — AB (ref 70–99)
POTASSIUM: 4.2 meq/L (ref 3.5–5.1)
SODIUM: 141 meq/L (ref 135–145)

## 2015-10-27 LAB — CBC WITH DIFFERENTIAL/PLATELET
BASOS ABS: 0 10*3/uL (ref 0.0–0.1)
Basophils Relative: 0.3 % (ref 0.0–3.0)
EOS ABS: 0 10*3/uL (ref 0.0–0.7)
Eosinophils Relative: 0.7 % (ref 0.0–5.0)
HEMATOCRIT: 42 % (ref 36.0–46.0)
HEMOGLOBIN: 14.1 g/dL (ref 12.0–15.0)
LYMPHS PCT: 16.6 % (ref 12.0–46.0)
Lymphs Abs: 1 10*3/uL (ref 0.7–4.0)
MCHC: 33.5 g/dL (ref 30.0–36.0)
MCV: 91.7 fl (ref 78.0–100.0)
Monocytes Absolute: 0.1 10*3/uL (ref 0.1–1.0)
Monocytes Relative: 1.2 % — ABNORMAL LOW (ref 3.0–12.0)
NEUTROS ABS: 4.8 10*3/uL (ref 1.4–7.7)
Neutrophils Relative %: 81.2 % — ABNORMAL HIGH (ref 43.0–77.0)
PLATELETS: 293 10*3/uL (ref 150.0–400.0)
RBC: 4.58 Mil/uL (ref 3.87–5.11)
RDW: 14 % (ref 11.5–15.5)
WBC: 6 10*3/uL (ref 4.0–10.5)

## 2015-10-27 NOTE — Telephone Encounter (Signed)
Will you please call and schedule Lauren Winters for diabetes follow up after December 21st? Thanks.

## 2015-10-27 NOTE — Telephone Encounter (Signed)
Noted. Discussed this through My Chart.

## 2015-10-27 NOTE — Telephone Encounter (Signed)
Patient came in the office for some labs. Patient stated she wanted Anda Kraft to know that the pain is from her L4 and she was told that she is schedule for an injection in the end of November. Patient stated that she is in a lot of pain and don't believe she can handle it until then. She stated that she would like second opinion on this, another referral.

## 2015-10-28 NOTE — Telephone Encounter (Signed)
Message left for patient to return my call to schedule follow up after 12/17/2015

## 2015-10-29 DIAGNOSIS — M5126 Other intervertebral disc displacement, lumbar region: Secondary | ICD-10-CM | POA: Insufficient documentation

## 2015-10-29 NOTE — Telephone Encounter (Signed)
Message left for patient to return my call.  

## 2015-10-31 NOTE — Telephone Encounter (Signed)
Sent patient a MyChart message to schedule appointment.

## 2015-11-17 ENCOUNTER — Encounter: Payer: Self-pay | Admitting: Primary Care

## 2015-11-25 ENCOUNTER — Encounter: Payer: Self-pay | Admitting: Primary Care

## 2015-12-01 ENCOUNTER — Encounter: Payer: Self-pay | Admitting: Primary Care

## 2015-12-05 ENCOUNTER — Ambulatory Visit: Payer: 59 | Admitting: Cardiovascular Disease

## 2015-12-08 ENCOUNTER — Encounter: Payer: Self-pay | Admitting: Primary Care

## 2015-12-15 ENCOUNTER — Encounter: Payer: Self-pay | Admitting: Primary Care

## 2015-12-16 NOTE — Telephone Encounter (Signed)
[place] office already reschedule appt.

## 2015-12-17 ENCOUNTER — Ambulatory Visit (INDEPENDENT_AMBULATORY_CARE_PROVIDER_SITE_OTHER): Payer: 59 | Admitting: Primary Care

## 2015-12-17 ENCOUNTER — Ambulatory Visit: Payer: 59 | Admitting: Primary Care

## 2015-12-17 ENCOUNTER — Encounter: Payer: Self-pay | Admitting: Primary Care

## 2015-12-17 VITALS — BP 152/100 | HR 94 | Temp 97.9°F | Ht 67.0 in | Wt 226.8 lb

## 2015-12-17 DIAGNOSIS — E119 Type 2 diabetes mellitus without complications: Secondary | ICD-10-CM

## 2015-12-17 DIAGNOSIS — I10 Essential (primary) hypertension: Secondary | ICD-10-CM

## 2015-12-17 DIAGNOSIS — M549 Dorsalgia, unspecified: Secondary | ICD-10-CM | POA: Insufficient documentation

## 2015-12-17 DIAGNOSIS — G8929 Other chronic pain: Secondary | ICD-10-CM

## 2015-12-17 LAB — MICROALBUMIN / CREATININE URINE RATIO
Creatinine,U: 149.7 mg/dL
Microalb Creat Ratio: 1.5 mg/g (ref 0.0–30.0)
Microalb, Ur: 2.2 mg/dL — ABNORMAL HIGH (ref 0.0–1.9)

## 2015-12-17 LAB — HEMOGLOBIN A1C: Hgb A1c MFr Bld: 6.6 % — ABNORMAL HIGH (ref 4.6–6.5)

## 2015-12-17 MED ORDER — METOPROLOL TARTRATE 100 MG PO TABS: 100.0000 mg | ORAL_TABLET | Freq: Two times a day (BID) | ORAL | Status: DC

## 2015-12-17 NOTE — Assessment & Plan Note (Signed)
Ongoing for nearly 1+ years/ Recent surgery in early December due to bulging discs. Currently following with neurosurgeon weekly, will also follow with pain management once through with neurosurgery. Continues to experience hip pain with radiculopathy.

## 2015-12-17 NOTE — Progress Notes (Signed)
Pre visit review using our clinic review tool, if applicable. No additional management support is needed unless otherwise documented below in the visit note. 

## 2015-12-17 NOTE — Progress Notes (Signed)
Subjective:    Patient ID: Lauren Winters, female    DOB: 03/28/1966, 49 y.o.   MRN: [id number]  HPI  Lauren Winters is a 49 year old female who presents today for follow up.  1) Type 2 Diabetes: Borderline diabetes of 5.9 in August 2016 which was an improvement from 6.8 in March 2016. She is currently managed on  Metformin 500 mg twice daily. Last eye exam was in May 2016. No microalbumin on file. She does experience jitteryness/dizziness occasionally but has not been eating regularly due to recent surgery.   2) Chronic Back Pain: Increased pain to lower back and right hip with radiculopathy several months ago. Long standing history of back pain and was following with rheumatology. Sent to pain management several months ago and underwent injections. MRI completed in October per pain management with results of disc protrusion to multiple places along lumbar spine.   We sent her to neurosurgery for a second opinion and she underwent surgery on December 6th (micro discectomy and spinal leak repair?). She has follow up scheduled weekly with her neurosurgeon. Increased numbness to right lower extremity and bilateral feet. She doesn't feel any improvement to her hips and lower extremities since surgery.   3) Essential Hypertension: Elevated in clinic today and also on several occasions:  12/13: 182/84 12/16: 178/114 12/19: 184/104  She reports headaches, dizziness. She is currently managed on HCTZ 25 mg, lopressor 50 mg BID, valsartan 320 mg. Prior readings mostly stable.   BP Readings from Last 3 Encounters:  10/21/15 110/68  10/10/15 126/84  10/03/15 171/84    Review of Systems  Respiratory: Negative for shortness of breath.   Cardiovascular: Negative for chest pain.  Musculoskeletal: Positive for arthralgias. Negative for joint swelling.  Neurological: Positive for numbness. Negative for dizziness and headaches.       Past Medical History  Diagnosis Date  . Hypertension   .  Lupus (systemic lupus erythematosus) ([place])   . Rheumatoid arthritis ([place])   . Raynaud's phenomenon   . Fibromyalgia   . Sjogren's disease ([place])   . Diabetes mellitus without complication (HCC) metformin  . Mitral regurgitation     a. 05/2014 echo: EF >55%, nl LV/RV sys fxn, mild MR/AR, no valvular stenosis   . Atypical chest pain     a. ETT 05/2014: normal stress test without evidence of myocardial ischemia or chest pain at peak stress  . Tobacco abuse     a. ongoing; b. ongoing SOB  . Obesity (BMI 30-39.9)   . Anxiety     Social History   Social History  . Marital Status: Married    Spouse Name: N/A  . Number of Children: N/A  . Years of Education: N/A   Occupational History  . Not on file.   Social History Main Topics  . Smoking status: Former Smoker -- 0.50 packs/day    Types: Cigarettes    Quit date: 07/22/2015  . Smokeless tobacco: Never Used  . Alcohol Use: No  . Drug Use: No  . Sexual Activity: Not on file   Other Topics Concern  . Not on file   Social History Narrative   Married.   3 children.   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     Past Surgical History  Procedure Laterality Date  . Abdominal hysterectomy    . Cesarean section      x3    Family History  Problem Relation Age of Onset  .  Hypertension Mother   . Valvular heart disease Mother   . CAD Father 60    CABG x 4  . Liver cancer Father 64    passed  . CAD Paternal Aunt     CABG  . CAD Paternal Aunt     CABG  . CAD Paternal Aunt     CABG  . CAD Paternal Aunt     CABG  . CAD Paternal Aunt     CABG  . CAD Paternal Uncle     MI s/p CABG  . Stroke Maternal Grandmother   . Stroke Maternal Grandfather   . CAD Maternal Grandfather     Allergies  Allergen Reactions  . Clindamycin/Lincomycin Hives  . Pseudoephedrine Hcl Other (See Comments)    Dizziness     Current Outpatient Prescriptions on File Prior to Visit  Medication Sig Dispense  Refill  . celecoxib (CELEBREX) 200 MG capsule Take 200 mg by mouth daily as needed.    . Cholecalciferol (VITAMIN D3) 50000 UNITS CAPS Take 1 capsule by mouth once a week.    . escitalopram (LEXAPRO) 20 MG tablet Take 20 mg by mouth daily.     . folic acid (FOLVITE) 1 MG tablet Take 2 tablets by mouth daily.    . hydrochlorothiazide (HYDRODIURIL) 25 MG tablet Take 1 tablet (25 mg total) by mouth daily. 30 tablet 3  . hydroxychloroquine (PLAQUENIL) 200 MG tablet Take 1 tablet by mouth 2 (two) times daily.    . metFORMIN (GLUCOPHAGE) 500 MG tablet Take 1 tablet (500 mg total) by mouth 2 (two) times daily. 60 tablet 3  . Triamcinolone Acetonide (TRIAMCINOLONE 0.1 % CREAM : EUCERIN) CREA Apply 1 application topically as needed.    . valsartan (DIOVAN) 320 MG tablet Take 1 tablet (320 mg total) by mouth daily. 30 tablet 5  . albuterol (PROVENTIL HFA;VENTOLIN HFA) 108 (90 BASE) MCG/ACT inhaler Inhale 2 puffs into the lungs every 6 (six) hours as needed for wheezing or shortness of breath. (Patient not taking: Reported on 12/17/2015) 1 Inhaler 2  . Fluticasone Furoate-Vilanterol (BREO ELLIPTA) 100-25 MCG/INH AEPB Inhale 1 puff into the lungs daily. (Patient not taking: Reported on 12/17/2015) 60 each 4  . nystatin cream (MYCOSTATIN) Apply 1 application topically 2 (two) times daily. (Patient not taking: Reported on 12/17/2015) 30 g 0  . OTREXUP 20 MG/0.4ML SOAJ Inject 1 Syringe into the muscle once a week. Reported on 12/17/2015    . traMADol (ULTRAM) 50 MG tablet Take 50 mg by mouth 3 (three) times daily. Reported on 12/17/2015     No current facility-administered medications on file prior to visit.    BP 152/100 mmHg  Pulse 94  Temp(Src) 97.9 F (36.6 C) (Oral)  Ht 5\' 7"  (1.702 m)  Wt 226 lb 12.8 oz (102.876 kg)  BMI 35.51 kg/m2  SpO2 98%    Objective:   Physical Exam  Constitutional: She appears well-nourished.  Cardiovascular: Normal rate and regular rhythm.   Pulmonary/Chest: Effort  normal and breath sounds normal.  Musculoskeletal:       Right hip: She exhibits decreased range of motion. She exhibits no bony tenderness.  Pain to right hip. Radiculopathy to bilateral lower extremities. Decrease in ROM to right hip and lower back.  Skin: Skin is warm and dry.          Assessment & Plan:

## 2015-12-17 NOTE — Assessment & Plan Note (Signed)
Above goal today and from home readings and neurosurgeons office. Currently managed on valsartan 320, lopressor 50 BID, and HCTZ 25. Will increase lopressor to 100 BID as BP above goal and HR can tolerate. She is to send me her readings in 2 weeks and follow up in 1 month.

## 2015-12-17 NOTE — Patient Instructions (Addendum)
We have increased your metoprolol from 50 mg twice daily to 100 mg twice daily. I've sent this new prescription to your pharmacy.  Check your blood pressure daily, around the same time of day, for the next 2 weeks.   Ensure that you have rested for 30 minutes prior to checking your blood pressure. Record your readings and send them to me through My Chart.  Complete lab work prior to leaving today. I will notify you of your results once received.   Follow up in 1 month for re-evaluation of blood pressure.  It was a pleasure to see you today!

## 2015-12-17 NOTE — Assessment & Plan Note (Signed)
A1C at 6.6 which is an increase from 5.9 in August. Recent surgery and pain with chronic issues. Will continue Metformin at same dose as her A1C meets my goal of <7. Urine micoralbumin with slight elevation. Currently managed on ARB, will continue to monitor.

## 2015-12-22 ENCOUNTER — Encounter: Payer: Self-pay | Admitting: Primary Care

## 2015-12-23 ENCOUNTER — Other Ambulatory Visit: Payer: Self-pay | Admitting: Primary Care

## 2015-12-23 DIAGNOSIS — F329 Major depressive disorder, single episode, unspecified: Secondary | ICD-10-CM

## 2015-12-23 DIAGNOSIS — F32A Depression, unspecified: Secondary | ICD-10-CM

## 2015-12-23 MED ORDER — BUPROPION HCL ER (XL) 150 MG PO TB24: 150.0000 mg | ORAL_TABLET | Freq: Every day | ORAL | Status: DC

## 2015-12-24 ENCOUNTER — Encounter: Payer: Self-pay | Admitting: Primary Care

## 2015-12-25 ENCOUNTER — Other Ambulatory Visit: Payer: Self-pay | Admitting: Primary Care

## 2015-12-25 DIAGNOSIS — I10 Essential (primary) hypertension: Secondary | ICD-10-CM

## 2015-12-25 MED ORDER — VALSARTAN-HYDROCHLOROTHIAZIDE 160-12.5 MG PO TABS: 1.0000 | ORAL_TABLET | Freq: Two times a day (BID) | ORAL | Status: DC

## 2015-12-25 MED ORDER — AMLODIPINE BESYLATE 5 MG PO TABS: 5.0000 mg | ORAL_TABLET | Freq: Two times a day (BID) | ORAL | Status: DC

## 2015-12-30 ENCOUNTER — Encounter: Payer: Self-pay | Admitting: Primary Care

## 2016-01-15 ENCOUNTER — Ambulatory Visit: Payer: 59

## 2016-01-19 ENCOUNTER — Encounter: Payer: Self-pay | Admitting: Primary Care

## 2016-01-19 ENCOUNTER — Ambulatory Visit: Payer: 59 | Admitting: Internal Medicine

## 2016-01-19 ENCOUNTER — Ambulatory Visit (INDEPENDENT_AMBULATORY_CARE_PROVIDER_SITE_OTHER): Payer: 59 | Admitting: Primary Care

## 2016-01-19 VITALS — BP 146/92 | HR 87 | Temp 97.8°F | Ht 67.0 in | Wt 222.8 lb

## 2016-01-19 DIAGNOSIS — M549 Dorsalgia, unspecified: Secondary | ICD-10-CM

## 2016-01-19 DIAGNOSIS — I10 Essential (primary) hypertension: Secondary | ICD-10-CM | POA: Diagnosis not present

## 2016-01-19 DIAGNOSIS — E119 Type 2 diabetes mellitus without complications: Secondary | ICD-10-CM

## 2016-01-19 DIAGNOSIS — G8929 Other chronic pain: Secondary | ICD-10-CM

## 2016-01-19 MED ORDER — METFORMIN HCL 500 MG PO TABS: 500.0000 mg | ORAL_TABLET | Freq: Two times a day (BID) | ORAL | Status: DC

## 2016-01-19 NOTE — Progress Notes (Signed)
Subjective:    Patient ID: Lauren Winters, female    DOB: 1966/04/08, 50 y.o.   MRN: [id number]  HPI  Lauren Winters is a 50 year old female who presents today for follow up of hypertension. Recently under a lot of pain and stress with chronic back pain. During her last visit her BP was at 152/100 with multiple elevated home readings of 170's-200/90-100's. She is currently managed on valsartan 320 mg, lopressor 50 mg BID, and HCTZ 25 mg. Her lopressor was increased to 100 mg BID last visit.  Since her last visit her BP has improved slightly, however is above goal. She denies dizziness, light headedness, chest pain. Her home numbers are averaging [id number] and endorses a similar reading at neurosurgeon's office. Her readings prior the increase in her back pain were at goal.  Review of Systems  Respiratory: Negative for cough and shortness of breath.   Cardiovascular: Negative for chest pain.  Neurological: Negative for dizziness and light-headedness.       Past Medical History  Diagnosis Date  . Hypertension   . Lupus (systemic lupus erythematosus) ([place])   . Rheumatoid arthritis ([place])   . Raynaud's phenomenon   . Fibromyalgia   . Sjogren's disease ([place])   . Diabetes mellitus without complication ([facility]) metformin  . Mitral regurgitation     a. 05/2014 echo: EF >55%, nl LV/RV sys fxn, mild MR/AR, no valvular stenosis   . Atypical chest pain     a. ETT 05/2014: normal stress test without evidence of myocardial ischemia or chest pain at peak stress  . Tobacco abuse     a. ongoing; b. ongoing SOB  . Obesity (BMI 30-39.9)   . Anxiety     Social History   Social History  . Marital Status: Married    Spouse Name: N/A  . Number of Children: N/A  . Years of Education: N/A   Occupational History  . Not on file.   Social History Main Topics  . Smoking status: Former Smoker -- 0.50 packs/day    Types: Cigarettes    Quit date: 07/22/2015  . Smokeless tobacco: Never Used  .  Alcohol Use: No  . Drug Use: No  . Sexual Activity: Not on file   Other Topics Concern  . Not on file   Social History Narrative   Married.   3 children.   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     Past Surgical History  Procedure Laterality Date  . Abdominal hysterectomy    . Cesarean section      x3    Family History  Problem Relation Age of Onset  . Hypertension Mother   . Valvular heart disease Mother   . CAD Father 67    CABG x 4  . Liver cancer Father 6    passed  . CAD Paternal Aunt     CABG  . CAD Paternal Aunt     CABG  . CAD Paternal Aunt     CABG  . CAD Paternal Aunt     CABG  . CAD Paternal Aunt     CABG  . CAD Paternal Uncle     MI s/p CABG  . Stroke Maternal Grandmother   . Stroke Maternal Grandfather   . CAD Maternal Grandfather     Allergies  Allergen Reactions  . Clindamycin/Lincomycin Hives  . Pseudoephedrine Hcl Other (See Comments)    Dizziness     Current Outpatient Prescriptions  on File Prior to Visit  Medication Sig Dispense Refill  . amLODipine (NORVASC) 5 MG tablet Take 1 tablet (5 mg total) by mouth 2 (two) times daily. 180 tablet 2  . buPROPion (WELLBUTRIN XL) 150 MG 24 hr tablet Take 1 tablet (150 mg total) by mouth daily. 30 tablet 2  . celecoxib (CELEBREX) 200 MG capsule Take 200 mg by mouth daily as needed.    . Cholecalciferol (VITAMIN D3) 50000 UNITS CAPS Take 1 capsule by mouth once a week.    . escitalopram (LEXAPRO) 20 MG tablet Take 20 mg by mouth daily.     . folic acid (FOLVITE) 1 MG tablet Take 2 tablets by mouth daily.    . hydroxychloroquine (PLAQUENIL) 200 MG tablet Take 1 tablet by mouth 2 (two) times daily.    . metoprolol (LOPRESSOR) 100 MG tablet Take 1 tablet (100 mg total) by mouth 2 (two) times daily. 60 tablet 3  . nystatin cream (MYCOSTATIN) Apply 1 application topically 2 (two) times daily. 30 g 0  . OTREXUP 20 MG/0.4ML SOAJ Inject 1 Syringe into the muscle once a  week. Reported on 12/17/2015    . traMADol (ULTRAM) 50 MG tablet Take 50 mg by mouth 3 (three) times daily. Reported on 12/17/2015    . Triamcinolone Acetonide (TRIAMCINOLONE 0.1 % CREAM : EUCERIN) CREA Apply 1 application topically as needed.    . valsartan-hydrochlorothiazide (DIOVAN HCT) 160-12.5 MG tablet Take 1 tablet by mouth 2 (two) times daily. 60 tablet 3   No current facility-administered medications on file prior to visit.    BP 146/92 mmHg  Pulse 87  Temp(Src) 97.8 F (36.6 C) (Oral)  Ht 5\' 7"  (1.702 m)  Wt 222 lb 12.8 oz (101.061 kg)  BMI 34.89 kg/m2  SpO2 96%    Objective:   Physical Exam  Constitutional: She appears well-nourished.  Cardiovascular: Normal rate and regular rhythm.   Pulmonary/Chest: Effort normal and breath sounds normal.  Skin: Skin is warm and dry.          Assessment & Plan:

## 2016-01-19 NOTE — Patient Instructions (Signed)
Try checking your blood pressure with another source ([facility], [facility], etc.) Please notify me if your readings are consistently above 150/90, or if you develop dizziness, lightheadedness.  Keep me updated regarding your back and hip pain.   Follow up in 3 months for re-evaluation of blood pressure.  It was a pleasure to see you today!

## 2016-01-19 NOTE — Assessment & Plan Note (Signed)
She has an appointment with her neurosurgeon later this week to discuss recent MRI and next steps.

## 2016-01-19 NOTE — Progress Notes (Signed)
Pre visit review using our clinic review tool, if applicable. No additional management support is needed unless otherwise documented below in the visit note. 

## 2016-01-19 NOTE — Assessment & Plan Note (Signed)
Slightly improved since last visit with increase of labetalol to 100 BID.  Still above goal; however, am reticent to increase/add another medication as she may decrease with a reduction in back pain. She has an appointment with her neurosurgeon later this week to discuss recent MRI and next steps. Will continue to monitor.

## 2016-01-24 ENCOUNTER — Encounter: Payer: Self-pay | Admitting: Primary Care

## 2016-02-04 DIAGNOSIS — G8929 Other chronic pain: Secondary | ICD-10-CM | POA: Insufficient documentation

## 2016-02-06 ENCOUNTER — Encounter: Payer: Self-pay | Admitting: Primary Care

## 2016-02-08 ENCOUNTER — Encounter: Payer: Self-pay | Admitting: Primary Care

## 2016-02-13 ENCOUNTER — Telehealth: Payer: Self-pay

## 2016-02-13 NOTE — Telephone Encounter (Signed)
Meiya dropped off some LTD forms and some test results for Katie, placed in Rx box up front

## 2016-02-13 NOTE — Telephone Encounter (Signed)
Placed on Robin's desk for review.

## 2016-02-13 NOTE — Telephone Encounter (Signed)
Placed paperwork in [place].

## 2016-02-16 NOTE — Telephone Encounter (Signed)
Spoke with pt   Pt stated long term disability  For right and lift hip pain and lower back pain same as short term disability Pt stated she has an appointment with [place] neurosurgery for pain management and injections 2/27 She stated you wanted to keep her out of work till April  Paperwork in [place] For review and signature

## 2016-02-16 NOTE — Telephone Encounter (Signed)
Left message asking pt to call office i have questions about paperwork

## 2016-02-17 ENCOUNTER — Telehealth: Payer: Self-pay | Admitting: Primary Care

## 2016-02-17 NOTE — Telephone Encounter (Signed)
Faxed paperwork  Copy for pt Copy for file Copy for scan

## 2016-02-17 NOTE — Telephone Encounter (Signed)
Handed completed FMLA paperwork to [place].

## 2016-02-18 ENCOUNTER — Ambulatory Visit: Payer: 59

## 2016-02-23 DIAGNOSIS — M533 Sacrococcygeal disorders, not elsewhere classified: Secondary | ICD-10-CM | POA: Insufficient documentation

## 2016-02-24 ENCOUNTER — Encounter: Payer: Self-pay | Admitting: Primary Care

## 2016-03-08 ENCOUNTER — Ambulatory Visit: Payer: 59 | Admitting: Internal Medicine

## 2016-03-08 ENCOUNTER — Encounter: Payer: Self-pay | Admitting: *Deleted

## 2016-03-09 ENCOUNTER — Other Ambulatory Visit: Payer: Self-pay | Admitting: Primary Care

## 2016-03-09 ENCOUNTER — Encounter: Payer: Self-pay | Admitting: Primary Care

## 2016-03-09 DIAGNOSIS — M25552 Pain in left hip: Secondary | ICD-10-CM | POA: Insufficient documentation

## 2016-03-09 DIAGNOSIS — G8929 Other chronic pain: Secondary | ICD-10-CM | POA: Insufficient documentation

## 2016-03-09 DIAGNOSIS — M545 Low back pain: Secondary | ICD-10-CM

## 2016-03-10 ENCOUNTER — Other Ambulatory Visit: Payer: Self-pay | Admitting: Primary Care

## 2016-03-10 ENCOUNTER — Encounter: Payer: Self-pay | Admitting: Primary Care

## 2016-03-10 DIAGNOSIS — E119 Type 2 diabetes mellitus without complications: Secondary | ICD-10-CM

## 2016-03-11 ENCOUNTER — Other Ambulatory Visit: Payer: Self-pay | Admitting: Primary Care

## 2016-03-11 DIAGNOSIS — E119 Type 2 diabetes mellitus without complications: Secondary | ICD-10-CM

## 2016-03-15 ENCOUNTER — Encounter: Payer: Self-pay | Admitting: Primary Care

## 2016-03-17 ENCOUNTER — Other Ambulatory Visit: Payer: Self-pay | Admitting: Primary Care

## 2016-03-17 ENCOUNTER — Other Ambulatory Visit (INDEPENDENT_AMBULATORY_CARE_PROVIDER_SITE_OTHER): Payer: 59

## 2016-03-17 ENCOUNTER — Encounter: Payer: Self-pay | Admitting: Primary Care

## 2016-03-17 DIAGNOSIS — R8290 Unspecified abnormal findings in urine: Secondary | ICD-10-CM | POA: Diagnosis not present

## 2016-03-17 DIAGNOSIS — E119 Type 2 diabetes mellitus without complications: Secondary | ICD-10-CM

## 2016-03-17 LAB — POC URINALSYSI DIPSTICK (AUTOMATED)
Bilirubin, UA: NEGATIVE
GLUCOSE UA: 100
Ketones, UA: NEGATIVE
Leukocytes, UA: NEGATIVE
Nitrite, UA: NEGATIVE
PH UA: 6
PROTEIN UA: 15
RBC UA: NEGATIVE
Spec Grav, UA: >=1.03
UROBILINOGEN UA: 0.2

## 2016-03-17 LAB — BASIC METABOLIC PANEL
BUN: 18 mg/dL (ref 6–23)
CALCIUM: 9.3 mg/dL (ref 8.4–10.5)
CO2: 31 mEq/L (ref 19–32)
CREATININE: 0.81 mg/dL (ref 0.40–1.20)
Chloride: 101 mEq/L (ref 96–112)
GFR: 78.91 mL/min (ref >60.00)
Glucose, Bld: 204 mg/dL — ABNORMAL HIGH (ref 70–99)
Potassium: 4.2 mEq/L (ref 3.5–5.1)
Sodium: 137 mEq/L (ref 135–145)

## 2016-03-17 LAB — HEMOGLOBIN A1C: HEMOGLOBIN A1C: 9.3 % — AB (ref 4.6–6.5)

## 2016-03-17 MED ORDER — METFORMIN HCL 1000 MG PO TABS: 1000.0000 mg | ORAL_TABLET | Freq: Two times a day (BID) | ORAL | Status: DC

## 2016-03-17 NOTE — Addendum Note (Signed)
Addended by: Marchia Bond on: 03/17/2016 01:53 PM   Modules accepted: Orders

## 2016-03-18 ENCOUNTER — Other Ambulatory Visit: Payer: Self-pay | Admitting: Primary Care

## 2016-03-18 ENCOUNTER — Encounter: Payer: Self-pay | Admitting: Primary Care

## 2016-03-18 DIAGNOSIS — E118 Type 2 diabetes mellitus with unspecified complications: Secondary | ICD-10-CM

## 2016-03-18 NOTE — Telephone Encounter (Signed)
Please see Mychart message.

## 2016-03-23 ENCOUNTER — Other Ambulatory Visit: Payer: Self-pay | Admitting: Neurosurgery

## 2016-03-23 DIAGNOSIS — M545 Low back pain: Principal | ICD-10-CM

## 2016-03-23 DIAGNOSIS — G8929 Other chronic pain: Secondary | ICD-10-CM

## 2016-04-01 ENCOUNTER — Encounter: Payer: 59 | Attending: Primary Care | Admitting: *Deleted

## 2016-04-01 ENCOUNTER — Encounter: Payer: Self-pay | Admitting: Primary Care

## 2016-04-01 ENCOUNTER — Encounter: Payer: Self-pay | Admitting: *Deleted

## 2016-04-01 VITALS — BP 142/90 | Ht 67.0 in | Wt 225.8 lb

## 2016-04-01 DIAGNOSIS — E119 Type 2 diabetes mellitus without complications: Secondary | ICD-10-CM | POA: Diagnosis not present

## 2016-04-01 NOTE — Progress Notes (Signed)
Diabetes Self-Management Education  Visit Type: First/Initial  Appt. Start Time: 1520 Appt. End Time: 1640  04/01/2016  [name], identified by name and date of birth, is a 50 y.o. female with a diagnosis of Diabetes: Type 2.   ASSESSMENT  Blood pressure 142/90, height 5\' 7"  (1.702 m), weight 225 lb 12.8 oz (102.422 kg). Body mass index is 35.36 kg/(m^2).      Diabetes Self-Management Education - 04/01/16 1737    Visit Information   Visit Type First/Initial   Initial Visit   Diabetes Type Type 2   Are you currently following a meal plan? No   Are you taking your medications as prescribed? Yes   Date Diagnosed Dec 2016 per A1C   Health Coping   How would you rate your overall health? Poor   Psychosocial Assessment   Patient Belief/Attitude about Diabetes Other (comment)  "I really don't know"  due to other heatlh issues.    Self-care barriers None   Self-management support Doctor's office;Family   Patient Concerns Nutrition/Meal planning;Medication;Monitoring;Healthy Lifestyle;Problem Solving;Glycemic Control;Weight Control   Special Needs None   Preferred Learning Style Visual;Auditory;Hands on   Learning Readiness Ready   How often do you need to have someone help you when you read instructions, pamphlets, or other written materials from your doctor or pharmacy? 1 - Never   What is the last grade level you completed in school? [id number]   Complications   Last HgB A1C per patient/outside source 9.3 %  03/17/16   How often do you check your blood sugar? 0 times/day (not testing)  Provided One Touch Verio Flex meter and instructed on use. BG upon return demonstration was 123 mg/dL at 4:25 pm - 5 hrs pp.    Have you had a dilated eye exam in the past 12 months? Yes   Have you had a dental exam in the past 12 months? No   Are you checking your feet? No   Dietary Intake   Breakfast toast, coffee   Lunch cheese sandwich, crackers   Dinner frozen dinners   Beverage(s)  coffee, milk, unsweetened beverages   Exercise   Exercise Type ADL's   Patient Education   Previous Diabetes Education No   Disease state  Factors that contribute to the development of diabetes   Nutrition management  Role of diet in the treatment of diabetes and the relationship between the three main macronutrients and blood glucose level;Carbohydrate counting   Physical activity and exercise  Helped patient identify appropriate exercises in relation to his/her diabetes, diabetes complications and other health issue.   Medications Reviewed patients medication for diabetes, action, purpose, timing of dose and side effects.   Monitoring Taught/evaluated SMBG meter.;Purpose and frequency of SMBG.;Identified appropriate SMBG and/or A1C goals.   Chronic complications Relationship between chronic complications and blood glucose control   Psychosocial adjustment Identified and addressed patients feelings and concerns about diabetes   Individualized Goals (developed by patient)   Reducing Risk Other (comment)Improve blood sugars Decrease medications Prevent diabetes complications Lose weight Lead a healthier lifestyle Become more fit   Outcomes   Expected Outcomes Demonstrated interest in learning. Expect positive outcomes      Individualized Plan for Diabetes Self-Management Training:   Learning Objective:  Patient will have a greater understanding of diabetes self-management. Patient education plan is to attend individual and/or group sessions per assessed needs and concerns.   Plan:   Patient Instructions  Check blood sugars 1 x day before breakfast or 2  hrs after supper every day Exercise: Walk as tolerated   Eat 3 meals day,  1-2  snacks a day Space meals 4-6 hours apart Drink plenty of water Bring blood sugar records to the next class Call your doctor for a prescription for:  1. Meter strips (type) One Touch Verio checking  1 time per day  2. Lancets (type) One Touch  Delica checking  1     time per day   Expected Outcomes:  Demonstrated interest in learning. Expect positive outcomes  Education material provided:  General Meal Planning Guidelines Simple Meal Plan Meter - One Touch Verio Flex  If problems or questions, patient to contact team via:  Johny Drilling, [place], [place], CDE [phone]  Future DSME appointment:  Thursday April 08, 2016 for Class 1

## 2016-04-01 NOTE — Patient Instructions (Addendum)
Check blood sugars 1 x day before breakfast or 2 hrs after supper every day Exercise: Walk as tolerated   Eat 3 meals day,  1-2  snacks a day Space meals 4-6 hours apart Drink plenty of water Bring blood sugar records to the next class Call your doctor for a prescription for:  1. Meter strips (type) One Touch Verio checking  1 time per day  2. Lancets (type) One Touch Delica checking  1     time per day

## 2016-04-02 ENCOUNTER — Ambulatory Visit
Admission: RE | Admit: 2016-04-02 | Discharge: 2016-04-02 | Disposition: A | Discharge status: Home / Self Care | Payer: 59 | Source: Ambulatory Visit | Attending: Neurosurgery | Admitting: Neurosurgery

## 2016-04-02 ENCOUNTER — Encounter: Payer: Self-pay | Admitting: Radiology

## 2016-04-02 ENCOUNTER — Telehealth: Payer: Self-pay | Admitting: Primary Care

## 2016-04-02 DIAGNOSIS — G8929 Other chronic pain: Secondary | ICD-10-CM

## 2016-04-02 DIAGNOSIS — M545 Low back pain: Principal | ICD-10-CM

## 2016-04-02 MED ORDER — IOPAMIDOL (ISOVUE-M 200) INJECTION 41%
15.0000 mL | Freq: Once | INTRAMUSCULAR | Status: AC
Start: 2016-04-02 — End: 2016-04-02
  Administered 2016-04-02: 15 mL via INTRATHECAL

## 2016-04-02 MED ORDER — DIAZEPAM 5 MG PO TABS
10.0000 mg | ORAL_TABLET | Freq: Once | ORAL | Status: AC
  Administered 2016-04-02: 10 mg via ORAL

## 2016-04-02 MED ORDER — ONDANSETRON HCL 4 MG/2ML IJ SOLN
4.0000 mg | Freq: Once | INTRAMUSCULAR | Status: AC
  Administered 2016-04-02: 4 mg via INTRAMUSCULAR

## 2016-04-02 MED ORDER — MEPERIDINE HCL 100 MG/ML IJ SOLN
100.0000 mg | Freq: Once | INTRAMUSCULAR | Status: AC
  Administered 2016-04-02: 100 mg via INTRAMUSCULAR

## 2016-04-02 NOTE — Discharge Instructions (Signed)
Myelogram Discharge Instructions  1. Go home and rest quietly for the next 24 hours.  It is important to lie flat for the next 24 hours.  Get up only to go to the restroom.  You may lie in the bed or on a couch on your back, your stomach, your left side or your right side.  You may have one pillow under your head.  You may have pillows between your knees while you are on your side or under your knees while you are on your back.  2. DO NOT drive today.  Recline the seat as far back as it will go, while still wearing your seat belt, on the way home.  3. You may get up to go to the bathroom as needed.  You may sit up for 10 minutes to eat.  You may resume your normal diet and medications unless otherwise indicated.  Drink lots of extra fluids today and tomorrow.  4. The incidence of headache, nausea, or vomiting is about 5% (one in 20 patients).  If you develop a headache, lie flat and drink plenty of fluids until the headache goes away.  Caffeinated beverages may be helpful.  If you develop severe nausea and vomiting or a headache that does not go away with flat bed rest, call [phone].  5. You may resume normal activities after your 24 hours of bed rest is over; however, do not exert yourself strongly or do any heavy lifting tomorrow. If when you get up you have a headache when standing, go back to bed and force fluids for another 24 hours.  6. Call your physician for a follow-up appointment.  The results of your myelogram will be sent directly to your physician by the following day.  7. If you have any questions or if complications develop after you arrive home, please call [phone].  Discharge instructions have been explained to the patient.  The patient, or the person responsible for the patient, fully understands these instructions.        May resume Lexapro and Wellbutrin on March 03, 2016, after 9:30 am.

## 2016-04-02 NOTE — Telephone Encounter (Signed)
Pt dropped disability status update forms In [place] IN BOX

## 2016-04-02 NOTE — Telephone Encounter (Signed)
Completed paperwork and placed on Robin's desk. 

## 2016-04-02 NOTE — Progress Notes (Signed)
Patient states she has been off Lexapro and Wellbutrin for the past two days.  Brita Romp, RN

## 2016-04-05 ENCOUNTER — Telehealth: Payer: Self-pay | Admitting: Radiology

## 2016-04-05 NOTE — Telephone Encounter (Signed)
Paperwork faxed °Pt aware °Copy for file  °Copy for scan °Copy for pt °

## 2016-04-08 ENCOUNTER — Ambulatory Visit: Payer: 59

## 2016-04-15 ENCOUNTER — Ambulatory Visit: Payer: 59

## 2016-04-19 ENCOUNTER — Ambulatory Visit (INDEPENDENT_AMBULATORY_CARE_PROVIDER_SITE_OTHER): Payer: 59 | Admitting: Primary Care

## 2016-04-19 ENCOUNTER — Encounter: Payer: Self-pay | Admitting: Primary Care

## 2016-04-19 VITALS — BP 130/70 | HR 109 | Temp 98.6°F | Ht 67.0 in | Wt 228.8 lb

## 2016-04-19 DIAGNOSIS — Z23 Encounter for immunization: Secondary | ICD-10-CM

## 2016-04-19 DIAGNOSIS — Z1211 Encounter for screening for malignant neoplasm of colon: Secondary | ICD-10-CM | POA: Diagnosis not present

## 2016-04-19 DIAGNOSIS — Z0001 Encounter for general adult medical examination with abnormal findings: Secondary | ICD-10-CM | POA: Insufficient documentation

## 2016-04-19 DIAGNOSIS — Z1239 Encounter for other screening for malignant neoplasm of breast: Secondary | ICD-10-CM | POA: Diagnosis not present

## 2016-04-19 DIAGNOSIS — Z0000 Encounter for general adult medical examination without abnormal findings: Secondary | ICD-10-CM | POA: Diagnosis not present

## 2016-04-19 DIAGNOSIS — F419 Anxiety disorder, unspecified: Secondary | ICD-10-CM

## 2016-04-19 DIAGNOSIS — E119 Type 2 diabetes mellitus without complications: Secondary | ICD-10-CM

## 2016-04-19 DIAGNOSIS — M549 Dorsalgia, unspecified: Secondary | ICD-10-CM

## 2016-04-19 DIAGNOSIS — G8929 Other chronic pain: Secondary | ICD-10-CM

## 2016-04-19 DIAGNOSIS — I10 Essential (primary) hypertension: Secondary | ICD-10-CM

## 2016-04-19 DIAGNOSIS — F32A Depression, unspecified: Secondary | ICD-10-CM

## 2016-04-19 DIAGNOSIS — F329 Major depressive disorder, single episode, unspecified: Secondary | ICD-10-CM

## 2016-04-19 NOTE — Progress Notes (Signed)
Pre visit review using our clinic review tool, if applicable. No additional management support is needed unless otherwise documented below in the visit note. 

## 2016-04-19 NOTE — Assessment & Plan Note (Signed)
A1C of 9.3 in March 2017. Currently managed on Metformin 1000 mg BID. Pneumonia UTD. Managed on ARB. Lipids and A1C due in late June 2017. Will adjust meds accordingly and add statin if necessary. Foot exam next visit. Urine microalbumin UTD.

## 2016-04-19 NOTE — Assessment & Plan Note (Signed)
Completed myleography in early April per neurosurgery. Scheduled to have epidural in May. Continues to be unable to sit or stand for longer than 15 minutes. Exam today with moderate decrease in ROM to lower back and hips bilaterally. Positive radiculopathy. Disability paperwork completed last month. Will continue to monitor.

## 2016-04-19 NOTE — Patient Instructions (Addendum)
You've been provided with a tetanus vaccination which will cover you for 10 years.  You will be contacted regarding your Mammogram and Colonoscopy.  Please let us know if you have not heard back within one week.   Schedule a lab only appointment after June 22nd for recheck of your diabetes and cholesterol.   It is important that you improve your diet. Please limit carbohydrates in the form of white bread, rice, pasta, sweets, frozen dinners, sugary drinks, etc. Increase your consumption of fresh fruits and vegetables.  You need to consume about 2 liters of water daily.  Start exercising. You should be getting 1 hour of exercise 5 days weekly as tolerated.   Please keep me updated in regards to your back pain.  Follow up in 6 months for re-evaluation.  It was a pleasure to see you today!  Diabetes Mellitus and Food It is important for you to manage your blood sugar (glucose) level. Your blood glucose level can be greatly affected by what you eat. Eating healthier foods in the appropriate amounts throughout the day at about the same time each day will help you control your blood glucose level. It can also help slow or prevent worsening of your diabetes mellitus. Healthy eating may even help you improve the level of your blood pressure and reach or maintain a healthy weight.  General recommendations for healthful eating and cooking habits include:  Eating meals and snacks regularly. Avoid going long periods of time without eating to lose weight.  Eating a diet that consists mainly of plant-based foods, such as fruits, vegetables, nuts, legumes, and whole grains.  Using low-heat cooking methods, such as baking, instead of high-heat cooking methods, such as deep frying. Work with your dietitian to make sure you understand how to use the Nutrition Facts information on food labels. HOW CAN FOOD AFFECT ME? Carbohydrates Carbohydrates affect your blood glucose level more than any other type of  food. Your dietitian will help you determine how many carbohydrates to eat at each meal and teach you how to count carbohydrates. Counting carbohydrates is important to keep your blood glucose at a healthy level, especially if you are using insulin or taking certain medicines for diabetes mellitus. Alcohol Alcohol can cause sudden decreases in blood glucose (hypoglycemia), especially if you use insulin or take certain medicines for diabetes mellitus. Hypoglycemia can be a life-threatening condition. Symptoms of hypoglycemia (sleepiness, dizziness, and disorientation) are similar to symptoms of having too much alcohol.  If your health care provider has given you approval to drink alcohol, do so in moderation and use the following guidelines:  Women should not have more than one drink per day, and men should not have more than two drinks per day. One drink is equal to:  12 oz of beer.  5 oz of wine.  1 oz of hard liquor.  Do not drink on an empty stomach.  Keep yourself hydrated. Have water, diet soda, or unsweetened iced tea.  Regular soda, juice, and other mixers might contain a lot of carbohydrates and should be counted. WHAT FOODS ARE NOT RECOMMENDED? As you make food choices, it is important to remember that all foods are not the same. Some foods have fewer nutrients per serving than other foods, even though they might have the same number of calories or carbohydrates. It is difficult to get your body what it needs when you eat foods with fewer nutrients. Examples of foods that you should avoid that are high in calories  and carbohydrates but low in nutrients include:  Trans fats (most processed foods list trans fats on the Nutrition Facts label).  Regular soda.  Juice.  Candy.  Sweets, such as cake, pie, doughnuts, and cookies.  Fried foods. WHAT FOODS CAN I EAT? Eat nutrient-rich foods, which will nourish your body and keep you healthy. The food you should eat also will depend  on several factors, including:  The calories you need.  The medicines you take.  Your weight.  Your blood glucose level.  Your blood pressure level.  Your cholesterol level. You should eat a variety of foods, including:  Protein.  Lean cuts of meat.  Proteins low in saturated fats, such as fish, egg whites, and beans. Avoid processed meats.  Fruits and vegetables.  Fruits and vegetables that may help control blood glucose levels, such as apples, mangoes, and yams.  Dairy products.  Choose fat-free or low-fat dairy products, such as milk, yogurt, and cheese.  Grains, bread, pasta, and rice.  Choose whole grain products, such as multigrain bread, whole oats, and brown rice. These foods may help control blood pressure.  Fats.  Foods containing healthful fats, such as nuts, avocado, olive oil, canola oil, and fish. DOES EVERYONE WITH DIABETES MELLITUS HAVE THE SAME MEAL PLAN? Because every person with diabetes mellitus is different, there is not one meal plan that works for everyone. It is very important that you meet with a dietitian who will help you create a meal plan that is just right for you.   This information is not intended to replace advice given to you by your health care provider. Make sure you discuss any questions you have with your health care provider.   Document Released: 09/09/2005 Document Revised: 01/03/2015 Document Reviewed: 11/09/2013 Elsevier Interactive Patient Education [employer].

## 2016-04-19 NOTE — Assessment & Plan Note (Signed)
Stable in office today. Continue Lopressor BID, Amlodipine, valsartan-HCTZ.

## 2016-04-19 NOTE — Addendum Note (Signed)
Addended by: Jacqualin Combes on: 04/19/2016 05:01 PM   Modules accepted: Orders, SmartSet

## 2016-04-19 NOTE — Assessment & Plan Note (Signed)
Stable on Lexapro and Wellbutrin. Declines therapy. Denies SI/HI. Continue current regimen.

## 2016-04-19 NOTE — Assessment & Plan Note (Signed)
No record of recent Td, provided today. Pneumonia and shingles UTD. Labs to be drawn in late June. Exam with moderate decrease in ROM to lower back and hips, otherwise unremarkable. Discussed the importance of a healthy diet and regular exercise in order for weight loss and to reduce risk of other medical diseases.  Follow up in 1 year for repeat physical. Follow up labs in 3 months for diabetes and lipids.

## 2016-04-19 NOTE — Progress Notes (Signed)
Subjective:    Patient ID: Lauren Winters, female    DOB: 28-Jun-1966, 50 y.o.   MRN: [id number]  HPI  Lauren Winters is a 50 year old female who presents today for complete physical.  Immunizations: -Tetanus: Unsure, believes it's been over 10 years.  -Influenza: Did not receive last season -Pneumonia: Completed in 2014 -Shingles: Completed in 2014  Diet: She endorses a fair diet. Breakfast: Toast and coffee Lunch: Pimento and cheese sandwhich Dinner: Sandwich, frozen dinners (pasta) Snacks: None Desserts: Occasionally Beverages: Diet coke, milk, little water  Exercise: She does not currently exercise.  Eye exam: Completed in 2016, due soon.  Dental exam: Not completed in years.  Colonoscopy: Never completed.  Pap Smear: Partial hysterectomy Mammogram: Has not completed in years.   1) Chronic Back Pain Update: She continues to experience pain to her bilateral lower back and hips. She's also experiencing sharp, shooting pain to her right lower extremity/groin region, and right foot numbness. She is currently managed per Neurosurgery and pain management. She completed a myelography lumbar spine in early April 2017. She is scheduled for an epidural in early May for an epidural. She does not have an appointment scheduled with neurology at this point.   She would like to go back to work but is unable to sit for longer than 15 minutes without discomfort to her hips, back, and groin. She cannot stand any longer than 10 minutes without pain to her hips, back, and groin. Her current occupation is a Network engineer job position.   Review of Systems  Constitutional: Negative for unexpected weight change.  HENT: Negative for rhinorrhea.   Respiratory: Negative for cough and shortness of breath.   Cardiovascular: Negative for chest pain.  Gastrointestinal: Negative for diarrhea and constipation.  Genitourinary: Negative for difficulty urinating and menstrual problem.  Musculoskeletal: Positive for  back pain and arthralgias.  Skin: Negative for rash.  Allergic/Immunologic: Negative for environmental allergies.  Neurological: Positive for numbness. Negative for dizziness and headaches.  Psychiatric/Behavioral:       Denies concerns for anxiety and depression, feels well managed.        Past Medical History  Diagnosis Date  . Hypertension   . Lupus (systemic lupus erythematosus) ([place])   . Rheumatoid arthritis ([place])   . Raynaud's phenomenon   . Fibromyalgia   . Sjogren's disease ([place])   . Diabetes mellitus without complication (HCC) metformin  . Mitral regurgitation     a. 05/2014 echo: EF >55%, nl LV/RV sys fxn, mild MR/AR, no valvular stenosis   . Atypical chest pain     a. ETT 05/2014: normal stress test without evidence of myocardial ischemia or chest pain at peak stress  . Tobacco abuse     a. ongoing; b. ongoing SOB  . Obesity (BMI 30-39.9)   . Anxiety      Social History   Social History  . Marital Status: Married    Spouse Name: N/A  . Number of Children: N/A  . Years of Education: N/A   Occupational History  . Not on file.   Social History Main Topics  . Smoking status: Former Smoker -- 0.50 packs/day for 10 years    Types: Cigarettes    Quit date: 07/22/2015  . Smokeless tobacco: Never Used  . Alcohol Use: No  . Drug Use: No  . Sexual Activity: Not on file   Other Topics Concern  . Not on file   Social History Narrative   Married.   3  children.   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     Past Surgical History  Procedure Laterality Date  . Abdominal hysterectomy    . Cesarean section      x3  . Lumbar laminectomy      Family History  Problem Relation Age of Onset  . Hypertension Mother   . Valvular heart disease Mother   . CAD Father 73    CABG x 4  . Liver cancer Father 7    passed  . CAD Paternal Aunt     CABG  . CAD Paternal Aunt     CABG  . CAD Paternal Aunt     CABG  . CAD Paternal  Aunt     CABG  . CAD Paternal Aunt     CABG  . CAD Paternal Uncle     MI s/p CABG  . Stroke Maternal Grandmother   . Stroke Maternal Grandfather   . CAD Maternal Grandfather     Allergies  Allergen Reactions  . Clindamycin/Lincomycin Hives  . Sudafed [Pseudoephedrine Hcl] Other (See Comments)    Dizziness     Current Outpatient Prescriptions on File Prior to Visit  Medication Sig Dispense Refill  . amLODipine (NORVASC) 5 MG tablet Take 1 tablet (5 mg total) by mouth 2 (two) times daily. 180 tablet 2  . buPROPion (WELLBUTRIN XL) 150 MG 24 hr tablet Take 1 tablet (150 mg total) by mouth daily. 30 tablet 2  . Cholecalciferol (VITAMIN D3) 50000 UNITS CAPS Take 1 capsule by mouth once a week.    . Clobetasol Prop Crea-Coal Tar (CLOBETA CREAM EX) Apply 1 application topically as needed.    Marland Kitchen escitalopram (LEXAPRO) 20 MG tablet Take 20 mg by mouth daily.     . folic acid (FOLVITE) 1 MG tablet Take 2 tablets by mouth daily.    . Halcinonide 0.1 % OINT Apply 1 application topically 2 (two) times daily as needed.    Marland Kitchen HYDROcodone-acetaminophen (NORCO/VICODIN) 5-325 MG tablet Take 1 tablet by mouth every 6 (six) hours as needed for moderate pain.    . hydroxychloroquine (PLAQUENIL) 200 MG tablet Take 1 tablet by mouth 2 (two) times daily.    . metFORMIN (GLUCOPHAGE) 1000 MG tablet Take 1 tablet (1,000 mg total) by mouth 2 (two) times daily with a meal. 180 tablet 3  . metoprolol (LOPRESSOR) 100 MG tablet Take 1 tablet (100 mg total) by mouth 2 (two) times daily. 60 tablet 3  . OTREXUP 20 MG/0.4ML SOAJ Inject 1 Syringe into the muscle once a week. Reported on 12/17/2015    . pilocarpine (SALAGEN) 5 MG tablet Take 5 mg by mouth 3 (three) times daily.    . tacrolimus (PROTOPIC) 0.1 % ointment Apply 1 application topically 2 (two) times daily as needed.    Marland Kitchen tiZANidine (ZANAFLEX) 2 MG tablet Take 2 mg by mouth 2 (two) times daily as needed for muscle spasms.    . Triamcinolone Acetonide  (TRIAMCINOLONE 0.1 % CREAM : EUCERIN) CREA Apply 1 application topically as needed.    . valsartan-hydrochlorothiazide (DIOVAN HCT) 160-12.5 MG tablet Take 1 tablet by mouth 2 (two) times daily. 60 tablet 3   No current facility-administered medications on file prior to visit.    BP 130/70 mmHg  Pulse 109  Temp(Src) 98.6 F (37 C) (Oral)  Ht 5\' 7"  (1.702 m)  Wt 228 lb 12.8 oz (103.783 kg)  BMI 35.83 kg/m2  SpO2 96%  Objective:   Physical Exam  Constitutional: She is oriented to person, place, and time. She appears well-nourished.  HENT:  Right Ear: Tympanic membrane and ear canal normal.  Left Ear: Tympanic membrane and ear canal normal.  Nose: Nose normal.  Mouth/Throat: Oropharynx is clear and moist.  Eyes: Conjunctivae and EOM are normal. Pupils are equal, round, and reactive to light.  Neck: Neck supple. Carotid bruit is not present. No thyromegaly present.  Cardiovascular: Normal rate and regular rhythm.   No murmur heard. Pulmonary/Chest: Effort normal and breath sounds normal. She has no rales.  Abdominal: Soft. Bowel sounds are normal. There is no tenderness.  Musculoskeletal:       Right hip: She exhibits decreased range of motion.       Left hip: She exhibits decreased range of motion.       Lumbar back: She exhibits decreased range of motion and pain.  Lymphadenopathy:    She has no cervical adenopathy.  Neurological: She is alert and oriented to person, place, and time. No cranial nerve deficit.  Reflex Scores:      Patellar reflexes are 3+ on the right side and 3+ on the left side. Skin: Skin is warm and dry. No rash noted.  Psychiatric: She has a normal mood and affect.          Assessment & Plan:

## 2016-04-19 NOTE — Assessment & Plan Note (Signed)
Stable on Lexapro and Wellbutrin. Offered therapy today due to ongoing back pain, she declines today.

## 2016-04-22 ENCOUNTER — Ambulatory Visit: Payer: 59

## 2016-04-30 ENCOUNTER — Encounter: Payer: Self-pay | Admitting: *Deleted

## 2016-05-05 ENCOUNTER — Encounter: Payer: Self-pay | Admitting: Primary Care

## 2016-05-05 MED ORDER — ESCITALOPRAM OXALATE 20 MG PO TABS: 20.0000 mg | ORAL_TABLET | Freq: Every day | ORAL | Status: DC

## 2016-05-31 ENCOUNTER — Encounter: Payer: Self-pay | Admitting: Primary Care

## 2016-06-11 ENCOUNTER — Other Ambulatory Visit: Payer: Self-pay | Admitting: Primary Care

## 2016-06-11 DIAGNOSIS — E119 Type 2 diabetes mellitus without complications: Secondary | ICD-10-CM

## 2016-06-11 DIAGNOSIS — E785 Hyperlipidemia, unspecified: Secondary | ICD-10-CM

## 2016-06-21 ENCOUNTER — Other Ambulatory Visit (INDEPENDENT_AMBULATORY_CARE_PROVIDER_SITE_OTHER): Payer: BLUE CROSS/BLUE SHIELD

## 2016-06-21 DIAGNOSIS — E785 Hyperlipidemia, unspecified: Secondary | ICD-10-CM

## 2016-06-21 DIAGNOSIS — E119 Type 2 diabetes mellitus without complications: Secondary | ICD-10-CM | POA: Diagnosis not present

## 2016-06-21 LAB — LIPID PANEL
CHOLESTEROL: 179 mg/dL (ref 0–200)
HDL: 47.3 mg/dL (ref >39.00)
LDL CALC: 103 mg/dL — AB (ref 0–99)
NonHDL: 131.41
TRIGLYCERIDES: 140 mg/dL (ref 0.0–149.0)
Total CHOL/HDL Ratio: 4
VLDL: 28 mg/dL (ref 0.0–40.0)

## 2016-06-21 LAB — HEMOGLOBIN A1C: Hgb A1c MFr Bld: 6.8 % — ABNORMAL HIGH (ref 4.6–6.5)

## 2016-07-22 ENCOUNTER — Telehealth: Payer: Self-pay | Admitting: Primary Care

## 2016-07-22 NOTE — Telephone Encounter (Signed)
[employer] faxed paperwork needing additional information We have no records from 04/26/16 to present  Please sign and date where marked. For current restrictions begin date Pt established care with you 08/04/15 in that office note she stated fatigue and exhaustion started 2/14  In kates in box

## 2016-07-22 NOTE — Telephone Encounter (Signed)
We do not have a recent office visit dating May 2017, however, do have labs from 06/21/2016. If she is applying for long-term disability then I would like to see her in the office to discuss before we complete this paperwork. Please schedule her for an appointment at her convenience. Is this the paperwork I am to complete after evaluation?

## 2016-07-23 ENCOUNTER — Encounter: Payer: Self-pay | Admitting: Primary Care

## 2016-07-23 NOTE — Telephone Encounter (Signed)
Appointment 07/28/16 Paperwork in [place] in box

## 2016-07-28 ENCOUNTER — Ambulatory Visit (INDEPENDENT_AMBULATORY_CARE_PROVIDER_SITE_OTHER): Payer: BLUE CROSS/BLUE SHIELD | Admitting: Primary Care

## 2016-07-28 ENCOUNTER — Encounter: Payer: Self-pay | Admitting: Primary Care

## 2016-07-28 VITALS — BP 154/96 | HR 65 | Temp 98.2°F | Ht 67.0 in | Wt 225.0 lb

## 2016-07-28 DIAGNOSIS — I10 Essential (primary) hypertension: Secondary | ICD-10-CM | POA: Diagnosis not present

## 2016-07-28 DIAGNOSIS — M545 Low back pain, unspecified: Secondary | ICD-10-CM

## 2016-07-28 DIAGNOSIS — M549 Dorsalgia, unspecified: Secondary | ICD-10-CM | POA: Diagnosis not present

## 2016-07-28 DIAGNOSIS — F419 Anxiety disorder, unspecified: Secondary | ICD-10-CM

## 2016-07-28 DIAGNOSIS — G8929 Other chronic pain: Secondary | ICD-10-CM

## 2016-07-28 DIAGNOSIS — F32A Depression, unspecified: Secondary | ICD-10-CM

## 2016-07-28 DIAGNOSIS — F329 Major depressive disorder, single episode, unspecified: Secondary | ICD-10-CM

## 2016-07-28 DIAGNOSIS — F418 Other specified anxiety disorders: Secondary | ICD-10-CM | POA: Diagnosis not present

## 2016-07-28 MED ORDER — CLONIDINE HCL 0.1 MG PO TABS: 0.1000 mg | ORAL_TABLET | Freq: Every day | ORAL | 1 refills | Status: DC

## 2016-07-28 MED ORDER — HYDROXYZINE HCL 25 MG PO TABS: 25.0000 mg | ORAL_TABLET | Freq: Two times a day (BID) | ORAL | 0 refills | Status: DC | PRN

## 2016-07-28 NOTE — Assessment & Plan Note (Signed)
Above goal in office today and also with pain management appointment yesterday. Home readings above goal as well. She is at max dose of amlodipine and valsartan. Heart rate cannot tolerate an increase in metoprolol this time. Will add low-dose clonidine once daily and have her continue to monitor blood pressure at home. If no improvement will have her take twice daily.

## 2016-07-28 NOTE — Progress Notes (Signed)
Pre visit review using our clinic review tool, if applicable. No additional management support is needed unless otherwise documented below in the visit note. 

## 2016-07-28 NOTE — Assessment & Plan Note (Signed)
Due for re-evaluation of long-term disability today. Symptoms of lower back, hip pain that has been ongoing since September 2016. She is requesting one year of long-term disability as she is currently not found effective treatment for chronic pain. Exam today with moderate reduction in range of motion to lower back and hips.  Recent CT myelogram with evidence of disc protrusion. No recent improvement with epidural injections. Patient requesting second opinion. Referral for orthopedics placed as she does have hip pain.  We'll complete an update long-term disability paperwork.

## 2016-07-28 NOTE — Assessment & Plan Note (Signed)
Increased anxiety with some panic attacks mostly at night before bed. Attributes this to uncertainty with her chronic back and hip pain. Will get her established with therapy as she will likely benefit, referral placed. Short-term supply of hydroxzine sent to pharmacy to use at bedtime for anxiety.  We'll continue to monitor.

## 2016-07-28 NOTE — Progress Notes (Signed)
Subjective:    Patient ID: Lauren Winters, female    DOB: 1966/04/25, 50 y.o.   MRN: [id number]  HPI  Lauren Winters is a 50 year old female who presents today to discuss long term disability. She has been out of work since September 2016 due to chronic back and hip pain.  Her symptoms have continued without improvement despite numerous treatments including surgery, spinal injections, pain medication. She cannot sit still in a chair any longer than 10 minutes or stand any longer than 10-15 minutes as she will experience bilateral lower back, hip and groin pain.   She underwent CT lumbar myelogram in April 2017 which shows central protrusion to L2-3 and shallow protrusion L4-5. She also has L5 nerve root impingement upon standing. She received an epidural in May 2017 with improvement in hip and groin pain for 3 weeks. She returned on July 6th for a repeat epidural without improvement. She presented to pain management yesterday which she believes was a negative experience. Her spine doctor has left the practice. She was taking hydrocodone and Tizanidine but has not taken in over one month as she's run out and also didn't find it effective. She's currently not taking anything for her pain.  She is working to lose weight through improvements in diet and walking but is limited in exercise due to symptoms.  2) Essential Hypertension: BP above goal in the clinic today, and also above goal at recent pain management appointment at 187/98. Currently managed on amlodipine 5 mg twice a day, lopressor 100 mg BID, and valsartan-hctz 160/12.5 mg twice a day. She's checking her BP at home which is running 150/80's on average. Denies chest pain, dizziness, visual changes.  3) Anxiety and Depression: Currently managed on Lexapro 20 mg and Wellbutrin 150 mg. Over the past 6 months she has noticed increased anxiety with panic attacks, mostly prior to bedtime. She will start to feel as though something bad will occur  just before bed. She will experience chest tightness and inability to fall asleep. This will occur most nights of the week. Denies feeling more depressed, suicidal/homicidal ideation.   Review of Systems  Eyes: Negative for visual disturbance.  Respiratory: Negative for shortness of breath.   Cardiovascular: Negative for chest pain.  Musculoskeletal: Positive for arthralgias and back pain. Negative for joint swelling.  Neurological: Negative for dizziness and headaches.  Psychiatric/Behavioral: Positive for sleep disturbance. Negative for suicidal ideas. The patient is nervous/anxious.        Past Medical History:  Diagnosis Date  . Anxiety   . Atypical chest pain    a. ETT 05/2014: normal stress test without evidence of myocardial ischemia or chest pain at peak stress  . Diabetes mellitus without complication (HCC) metformin  . Fibromyalgia   . Hypertension   . Lupus (systemic lupus erythematosus) ([place])   . Mitral regurgitation    a. 05/2014 echo: EF >55%, nl LV/RV sys fxn, mild MR/AR, no valvular stenosis   . Obesity (BMI 30-39.9)   . Raynaud's phenomenon   . Rheumatoid arthritis ([place])   . Sjogren's disease ([place])   . Tobacco abuse    a. ongoing; b. ongoing SOB     Social History   Social History  . Marital status: Married    Spouse name: N/A  . Number of children: N/A  . Years of education: N/A   Occupational History  . Not on file.   Social History Main Topics  . Smoking status: Former Smoker  Packs/day: 0.50    Years: 10.00    Types: Cigarettes    Quit date: 07/22/2015  . Smokeless tobacco: Never Used  . Alcohol use No  . Drug use: No  . Sexual activity: Not on file   Other Topics Concern  . Not on file   Social History Narrative   Married.   3 children.   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     Past Surgical History:  Procedure Laterality Date  . ABDOMINAL HYSTERECTOMY    . CESAREAN SECTION     x3  . LUMBAR  LAMINECTOMY      Family History  Problem Relation Age of Onset  . Hypertension Mother   . Valvular heart disease Mother   . CAD Father 42    CABG x 4  . Liver cancer Father 69    passed  . CAD Paternal Aunt     CABG  . CAD Paternal Aunt     CABG  . CAD Paternal Aunt     CABG  . CAD Paternal Aunt     CABG  . CAD Paternal Aunt     CABG  . CAD Paternal Uncle     MI s/p CABG  . Stroke Maternal Grandmother   . Stroke Maternal Grandfather   . CAD Maternal Grandfather     Allergies  Allergen Reactions  . Clindamycin/Lincomycin Hives  . Sudafed [Pseudoephedrine Hcl] Other (See Comments)    Dizziness     Current Outpatient Prescriptions on File Prior to Visit  Medication Sig Dispense Refill  . amLODipine (NORVASC) 5 MG tablet Take 1 tablet (5 mg total) by mouth 2 (two) times daily. 180 tablet 2  . buPROPion (WELLBUTRIN XL) 150 MG 24 hr tablet Take 1 tablet (150 mg total) by mouth daily. 30 tablet 2  . Cholecalciferol (VITAMIN D3) 50000 UNITS CAPS Take 1 capsule by mouth once a week.    . Clobetasol Prop Crea-Coal Tar (CLOBETA CREAM EX) Apply 1 application topically as needed.    Marland Kitchen escitalopram (LEXAPRO) 20 MG tablet Take 1 tablet (20 mg total) by mouth daily. 90 tablet 1  . folic acid (FOLVITE) 1 MG tablet Take 2 tablets by mouth daily.    . Halcinonide 0.1 % OINT Apply 1 application topically 2 (two) times daily as needed.    . hydroxychloroquine (PLAQUENIL) 200 MG tablet Take 1 tablet by mouth 2 (two) times daily.    . metFORMIN (GLUCOPHAGE) 1000 MG tablet Take 1 tablet (1,000 mg total) by mouth 2 (two) times daily with a meal. 180 tablet 3  . metoprolol (LOPRESSOR) 100 MG tablet Take 1 tablet (100 mg total) by mouth 2 (two) times daily. 60 tablet 3  . OTREXUP 20 MG/0.4ML SOAJ Inject 1 Syringe into the muscle once a week. Reported on 12/17/2015    . pilocarpine (SALAGEN) 5 MG tablet Take 5 mg by mouth 3 (three) times daily.    . tacrolimus (PROTOPIC) 0.1 % ointment Apply 1  application topically 2 (two) times daily as needed.    . Triamcinolone Acetonide (TRIAMCINOLONE 0.1 % CREAM : EUCERIN) CREA Apply 1 application topically as needed.    . valsartan-hydrochlorothiazide (DIOVAN HCT) 160-12.5 MG tablet Take 1 tablet by mouth 2 (two) times daily. 60 tablet 3   No current facility-administered medications on file prior to visit.     BP (!) 154/96   Pulse 65   Temp 98.2 F (36.8 C) (Oral)   Ht  5\' 7"  (1.702 m)   Wt 225 lb (102.1 kg)   SpO2 98%   BMI 35.24 kg/m    Objective:   Physical Exam  Constitutional: She appears well-nourished.  Neck: Neck supple.  Cardiovascular: Normal rate and regular rhythm.   Pulmonary/Chest: Effort normal and breath sounds normal.  Musculoskeletal:       Right hip: She exhibits decreased range of motion.       Left hip: She exhibits decreased range of motion.       Lumbar back: She exhibits decreased range of motion and pain. She exhibits no tenderness, no deformity and no spasm.  Skin: Skin is warm and dry.  Psychiatric: She has a normal mood and affect.          Assessment & Plan:

## 2016-07-28 NOTE — Patient Instructions (Addendum)
Start Clonidine 0.1 mg tablets for high blood pressure. Take 1 tablet by mouth once daily.   Continue to monitor your blood pressure once daily for the next 2 weeks and send me your readings through My Chart.  You will be contacted regarding your referral to therapy and also for orthopedics.  Please let us know if you have not heard back within one week.   You may take hydroxyzine 25 mg tablets at bedtime as needed for anxiety/panic attacks.  I will complete your disability paperwork later today.  Follow up in October as scheduled. It was a pleasure to see you today!

## 2016-07-29 NOTE — Telephone Encounter (Signed)
Paperwork faxed °Copy for file °Copy for scan °Copy for pt °Pt aware °

## 2016-08-02 ENCOUNTER — Telehealth: Payer: Self-pay | Admitting: Primary Care

## 2016-08-02 NOTE — Telephone Encounter (Signed)
Pt placed on [facility] WQ. Pt is aware they will call to schedule with either Dr. Rexene Edison or Clint Bolder. Pt is also aware she can call and schedule as well.

## 2016-09-08 ENCOUNTER — Telehealth: Payer: Self-pay | Admitting: Primary Care

## 2016-09-08 NOTE — Telephone Encounter (Signed)
[employer] faxed paperwork over In k[facility]'s IN BOX

## 2016-09-09 ENCOUNTER — Encounter: Payer: Self-pay | Admitting: Primary Care

## 2016-09-09 NOTE — Telephone Encounter (Signed)
Noted. Review document and appropriate changes made. Placed on [employer] for faxing.

## 2016-09-10 ENCOUNTER — Encounter: Payer: Self-pay | Admitting: Primary Care

## 2016-09-10 NOTE — Telephone Encounter (Signed)
Paperwork faxed 9/15 Copy for file Copy for scan

## 2016-09-14 ENCOUNTER — Ambulatory Visit (INDEPENDENT_AMBULATORY_CARE_PROVIDER_SITE_OTHER): Payer: BLUE CROSS/BLUE SHIELD | Admitting: Psychology

## 2016-09-14 DIAGNOSIS — F331 Major depressive disorder, recurrent, moderate: Secondary | ICD-10-CM

## 2016-09-14 DIAGNOSIS — F410 Panic disorder [episodic paroxysmal anxiety] without agoraphobia: Secondary | ICD-10-CM

## 2016-09-22 ENCOUNTER — Other Ambulatory Visit: Payer: Self-pay | Admitting: Primary Care

## 2016-09-22 DIAGNOSIS — F329 Major depressive disorder, single episode, unspecified: Secondary | ICD-10-CM

## 2016-09-22 DIAGNOSIS — F32A Depression, unspecified: Secondary | ICD-10-CM

## 2016-09-23 ENCOUNTER — Ambulatory Visit (INDEPENDENT_AMBULATORY_CARE_PROVIDER_SITE_OTHER): Payer: BLUE CROSS/BLUE SHIELD | Admitting: Psychology

## 2016-09-23 DIAGNOSIS — F331 Major depressive disorder, recurrent, moderate: Secondary | ICD-10-CM

## 2016-09-23 DIAGNOSIS — F410 Panic disorder [episodic paroxysmal anxiety] without agoraphobia: Secondary | ICD-10-CM

## 2016-09-28 ENCOUNTER — Encounter: Payer: Self-pay | Admitting: Primary Care

## 2016-09-28 MED ORDER — BUPROPION HCL ER (XL) 150 MG PO TB24: 150.0000 mg | ORAL_TABLET | Freq: Every day | ORAL | 5 refills | Status: DC

## 2016-10-02 ENCOUNTER — Other Ambulatory Visit: Payer: Self-pay | Admitting: Radiology

## 2016-10-02 DIAGNOSIS — Z79899 Other long term (current) drug therapy: Secondary | ICD-10-CM

## 2016-10-05 ENCOUNTER — Encounter: Payer: Self-pay | Admitting: Primary Care

## 2016-10-05 ENCOUNTER — Telehealth: Payer: Self-pay | Admitting: Primary Care

## 2016-10-05 DIAGNOSIS — I10 Essential (primary) hypertension: Secondary | ICD-10-CM

## 2016-10-05 MED ORDER — VALSARTAN-HYDROCHLOROTHIAZIDE 320-25 MG PO TABS: 1.0000 | ORAL_TABLET | Freq: Every day | ORAL | 3 refills | Status: DC

## 2016-10-05 NOTE — Telephone Encounter (Signed)
Noted, new dose sent to pharmacy for once daily dosing. Please update the patient on this change as she will be taking just 1 tablet by mouth once daily. Please also have her monitor her blood pressure and notify me if it gets below 100/60.

## 2016-10-05 NOTE — Telephone Encounter (Signed)
Spoken and notified patient of [name]'s comments. Patient verbalized understanding. 

## 2016-10-05 NOTE — Telephone Encounter (Signed)
Received faxed refill request for  valsartan-hydrochlorothiazide (DIOVAN HCT) 160-12.5 MG   Last prescribed on 12/15/2015.  Also noted from the pharmacy - We need a prescription that is for 1 tablet daily. The insurance will not cover more than 1 tablet daily. This may require change in strength (it does come in 320/25).  Please advise.

## 2016-10-07 ENCOUNTER — Ambulatory Visit: Payer: BLUE CROSS/BLUE SHIELD | Admitting: Psychology

## 2016-10-07 ENCOUNTER — Ambulatory Visit (INDEPENDENT_AMBULATORY_CARE_PROVIDER_SITE_OTHER): Payer: BLUE CROSS/BLUE SHIELD | Admitting: Psychology

## 2016-10-07 DIAGNOSIS — F410 Panic disorder [episodic paroxysmal anxiety] without agoraphobia: Secondary | ICD-10-CM

## 2016-10-07 DIAGNOSIS — F331 Major depressive disorder, recurrent, moderate: Secondary | ICD-10-CM | POA: Diagnosis not present

## 2016-10-19 ENCOUNTER — Ambulatory Visit (INDEPENDENT_AMBULATORY_CARE_PROVIDER_SITE_OTHER): Payer: BLUE CROSS/BLUE SHIELD | Admitting: Primary Care

## 2016-10-19 ENCOUNTER — Encounter: Payer: Self-pay | Admitting: Primary Care

## 2016-10-19 VITALS — BP 128/88 | HR 77 | Temp 98.6°F | Ht 67.0 in | Wt 220.1 lb

## 2016-10-19 DIAGNOSIS — E785 Hyperlipidemia, unspecified: Secondary | ICD-10-CM | POA: Diagnosis not present

## 2016-10-19 DIAGNOSIS — Z23 Encounter for immunization: Secondary | ICD-10-CM | POA: Diagnosis not present

## 2016-10-19 DIAGNOSIS — F419 Anxiety disorder, unspecified: Secondary | ICD-10-CM

## 2016-10-19 DIAGNOSIS — I10 Essential (primary) hypertension: Secondary | ICD-10-CM | POA: Diagnosis not present

## 2016-10-19 DIAGNOSIS — E119 Type 2 diabetes mellitus without complications: Secondary | ICD-10-CM

## 2016-10-19 LAB — MICROALBUMIN / CREATININE URINE RATIO
CREATININE, U: 254.8 mg/dL
MICROALB/CREAT RATIO: 0.7 mg/g (ref 0.0–30.0)
Microalb, Ur: 1.7 mg/dL (ref 0.0–1.9)

## 2016-10-19 NOTE — Patient Instructions (Addendum)
Stop by the lab for your urine specimen.  Follow up in 6 months for your annual physical exam or sooner if needed.  It was a pleasure to see you today!

## 2016-10-19 NOTE — Assessment & Plan Note (Signed)
Stable. Will recheck in 6 months during annual exam.

## 2016-10-19 NOTE — Assessment & Plan Note (Signed)
Following with Rheumatology. Due in December 2017 for follow up.

## 2016-10-19 NOTE — Assessment & Plan Note (Signed)
Follows with rheumatology, due in December 2017 for follow up.

## 2016-10-19 NOTE — Assessment & Plan Note (Signed)
Stable. Continue Lexapro and Wellbutrin.

## 2016-10-19 NOTE — Assessment & Plan Note (Signed)
Stable in the office today, continue current regimen. 

## 2016-10-19 NOTE — Assessment & Plan Note (Signed)
Improved with addition of hydroxyzine PRN. Continue Lexapro and Wellbutrin. Overall stable.

## 2016-10-19 NOTE — Addendum Note (Signed)
Addended by: Jacqualin Combes on: 10/19/2016 11:16 AM   Modules accepted: Orders

## 2016-10-19 NOTE — Assessment & Plan Note (Signed)
Currently following with orthopedics and spinal intervention. Ongoing pain that is worse than prior to surgery. Very uncomfortable today during exam.  Continue follow up with ortho.

## 2016-10-19 NOTE — Progress Notes (Signed)
Subjective:    Patient ID: Lauren Winters, female    DOB: 1966-09-22, 50 y.o.   MRN: [id number]  HPI  Lauren Winters is a 50 year old female who presents today for follow up.  1) Chronic Back Pain: Ongoing lower back and hip pain for the past 1+ years. She has undergone numerous imaging tests, physical therapy, spinal injections, speciality evaluation, and surgery. She underwent a CT myelogram in July 2017 with evidence of disc protrusion. She has since followed up with orthopedics who saw arthritis to her hips and sent her to a Spinal Intervention. She saw Spinal Inver[facility] yesterday who provided her with an injection and started her on Gabapentin. She is feeling worse than before her surgery. She continues to have difficulty sitting, standing, and now driving.   2) Essential Hypertension: Currently managed on Amlodipine 5 mg BID, Clonidine 0.1 mg once daily, metoprolol tartrate 100 mg BID, and Diovan-HCT 320/25 mg. Blood pressure in the office today is 128/88. She denies chest pain, dizziness, visual changes.  3) Generalized Anxiety Disorder: Currently managed on Lexapro 20 mg, Wellbutrin XL 150 mg, and hydroxyzine PRN. Overall she feels well managed, but does have occasional/mild panic attacks. She's been seeing therapy through [facility] once weekly with improvement. The hydroxyzine has helped with breakthrough anxiety.  4) Type 2 Diabetes: Currently managed on Metformin 1000 mg BID. A1C of 6.8 in June 2017. Urine micro albumin due today. She denies dizziness, visual changes, new numbness/tingling.  5) Rheumatoid Arthritis: Currently managed on Plaquenil 200 mg and Methotrexate per Rheumatology. Her next appointment is in December 2017. She feels as though her Lupus is flaring.   Review of Systems  Eyes: Negative for visual disturbance.  Respiratory: Negative for shortness of breath.   Cardiovascular: Negative for chest pain.  Musculoskeletal: Positive for arthralgias and back pain.    Neurological: Negative for dizziness and numbness.  Psychiatric/Behavioral: The patient is not nervous/anxious.        Past Medical History:  Diagnosis Date  . Anxiety   . Atypical chest pain    a. ETT 05/2014: normal stress test without evidence of myocardial ischemia or chest pain at peak stress  . Diabetes mellitus without complication (HCC) metformin  . Fibromyalgia   . Hypertension   . Lupus (systemic lupus erythematosus) ([place])   . Mitral regurgitation    a. 05/2014 echo: EF >55%, nl LV/RV sys fxn, mild MR/AR, no valvular stenosis   . Obesity (BMI 30-39.9)   . Raynaud's phenomenon   . Rheumatoid arthritis ([place])   . Sjogren's disease ([place])   . Tobacco abuse    a. ongoing; b. ongoing SOB     Social History   Social History  . Marital status: Married    Spouse name: N/A  . Number of children: N/A  . Years of education: N/A   Occupational History  . Not on file.   Social History Main Topics  . Smoking status: Former Smoker    Packs/day: 0.50    Years: 10.00    Types: Cigarettes    Quit date: 07/22/2015  . Smokeless tobacco: Never Used  . Alcohol use No  . Drug use: No  . Sexual activity: Not on file   Other Topics Concern  . Not on file   Social History Narrative   Married.   3 children.   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     Past Surgical History:  Procedure Laterality Date  .  ABDOMINAL HYSTERECTOMY    . CESAREAN SECTION     x3  . LUMBAR LAMINECTOMY      Family History  Problem Relation Age of Onset  . Hypertension Mother   . Valvular heart disease Mother   . CAD Father 49    CABG x 4  . Liver cancer Father 42    passed  . CAD Paternal Aunt     CABG  . CAD Paternal Aunt     CABG  . CAD Paternal Aunt     CABG  . CAD Paternal Aunt     CABG  . CAD Paternal Aunt     CABG  . CAD Paternal Uncle     MI s/p CABG  . Stroke Maternal Grandmother   . Stroke Maternal Grandfather   . CAD Maternal  Grandfather     Allergies  Allergen Reactions  . Clindamycin/Lincomycin Hives  . Sudafed [Pseudoephedrine Hcl] Other (See Comments)    Dizziness     Current Outpatient Prescriptions on File Prior to Visit  Medication Sig Dispense Refill  . amLODipine (NORVASC) 5 MG tablet Take 1 tablet (5 mg total) by mouth 2 (two) times daily. 180 tablet 2  . buPROPion (WELLBUTRIN XL) 150 MG 24 hr tablet Take 1 tablet (150 mg total) by mouth daily. 30 tablet 5  . Cholecalciferol (VITAMIN D3) 50000 UNITS CAPS Take 1 capsule by mouth once a week.    . Clobetasol Prop Crea-Coal Tar (CLOBETA CREAM EX) Apply 1 application topically as needed.    . cloNIDine (CATAPRES) 0.1 MG tablet Take 1 tablet (0.1 mg total) by mouth daily. 30 tablet 1  . escitalopram (LEXAPRO) 20 MG tablet Take 1 tablet (20 mg total) by mouth daily. 90 tablet 1  . folic acid (FOLVITE) 1 MG tablet Take 2 tablets by mouth daily.    . Halcinonide 0.1 % OINT Apply 1 application topically 2 (two) times daily as needed.    . hydroxychloroquine (PLAQUENIL) 200 MG tablet Take 1 tablet by mouth 2 (two) times daily.    . hydrOXYzine (ATARAX/VISTARIL) 25 MG tablet Take 1 tablet (25 mg total) by mouth 2 (two) times daily as needed for anxiety. 30 tablet 0  . metFORMIN (GLUCOPHAGE) 1000 MG tablet Take 1 tablet (1,000 mg total) by mouth 2 (two) times daily with a meal. 180 tablet 3  . metoprolol (LOPRESSOR) 100 MG tablet Take 1 tablet (100 mg total) by mouth 2 (two) times daily. 60 tablet 3  . OTREXUP 20 MG/0.4ML SOAJ Inject 1 Syringe into the muscle once a week. Reported on 12/17/2015    . pilocarpine (SALAGEN) 5 MG tablet Take 5 mg by mouth 3 (three) times daily.    . tacrolimus (PROTOPIC) 0.1 % ointment Apply 1 application topically 2 (two) times daily as needed.    . Triamcinolone Acetonide (TRIAMCINOLONE 0.1 % CREAM : EUCERIN) CREA Apply 1 application topically as needed.    . valsartan-hydrochlorothiazide (DIOVAN-HCT) 320-25 MG tablet Take 1  tablet by mouth daily. 90 tablet 3   No current facility-administered medications on file prior to visit.     BP 128/88   Pulse 77   Temp 98.6 F (37 C) (Oral)   Ht 5\' 7"  (1.702 m)   Wt 220 lb 1.9 oz (99.8 kg)   SpO2 98%   BMI 34.48 kg/m    Objective:   Physical Exam  Constitutional: She appears well-nourished.  Cardiovascular: Normal rate and regular rhythm.   Pulmonary/Chest: Effort normal and breath  sounds normal.  Musculoskeletal:       Lumbar back: She exhibits decreased range of motion and pain. She exhibits no tenderness, no bony tenderness and no spasm.  Difficulty sitting during exam today, appears uncomfortable.  Skin: Skin is warm and dry.  Psychiatric: She has a normal mood and affect.          Assessment & Plan:

## 2016-10-19 NOTE — Assessment & Plan Note (Signed)
Stable, A1C of 6.8 in June 2017.  Due for urine microalbumin today, pending.

## 2016-10-19 NOTE — Progress Notes (Signed)
Pre visit review using our clinic review tool, if applicable. No additional management support is needed unless otherwise documented below in the visit note. 

## 2016-10-21 ENCOUNTER — Ambulatory Visit (INDEPENDENT_AMBULATORY_CARE_PROVIDER_SITE_OTHER): Payer: BLUE CROSS/BLUE SHIELD | Admitting: Psychology

## 2016-10-21 DIAGNOSIS — F331 Major depressive disorder, recurrent, moderate: Secondary | ICD-10-CM | POA: Diagnosis not present

## 2016-10-21 DIAGNOSIS — F410 Panic disorder [episodic paroxysmal anxiety] without agoraphobia: Secondary | ICD-10-CM | POA: Diagnosis not present

## 2016-10-28 ENCOUNTER — Ambulatory Visit (INDEPENDENT_AMBULATORY_CARE_PROVIDER_SITE_OTHER): Payer: BLUE CROSS/BLUE SHIELD | Admitting: Psychology

## 2016-10-28 DIAGNOSIS — F410 Panic disorder [episodic paroxysmal anxiety] without agoraphobia: Secondary | ICD-10-CM | POA: Diagnosis not present

## 2016-10-28 DIAGNOSIS — F331 Major depressive disorder, recurrent, moderate: Secondary | ICD-10-CM

## 2016-11-04 ENCOUNTER — Ambulatory Visit: Payer: BLUE CROSS/BLUE SHIELD | Admitting: Psychology

## 2016-11-11 ENCOUNTER — Ambulatory Visit (INDEPENDENT_AMBULATORY_CARE_PROVIDER_SITE_OTHER): Payer: BLUE CROSS/BLUE SHIELD | Admitting: Psychology

## 2016-11-11 DIAGNOSIS — F410 Panic disorder [episodic paroxysmal anxiety] without agoraphobia: Secondary | ICD-10-CM | POA: Diagnosis not present

## 2016-11-11 DIAGNOSIS — F331 Major depressive disorder, recurrent, moderate: Secondary | ICD-10-CM | POA: Diagnosis not present

## 2016-11-22 ENCOUNTER — Encounter: Payer: Self-pay | Admitting: Primary Care

## 2016-11-22 ENCOUNTER — Other Ambulatory Visit: Payer: Self-pay | Admitting: Rheumatology

## 2016-11-25 ENCOUNTER — Ambulatory Visit: Payer: Self-pay | Admitting: Psychology

## 2016-11-26 LAB — HM DIABETES EYE EXAM

## 2016-12-02 ENCOUNTER — Ambulatory Visit (INDEPENDENT_AMBULATORY_CARE_PROVIDER_SITE_OTHER): Payer: BLUE CROSS/BLUE SHIELD | Admitting: Psychology

## 2016-12-02 DIAGNOSIS — F410 Panic disorder [episodic paroxysmal anxiety] without agoraphobia: Secondary | ICD-10-CM

## 2016-12-02 DIAGNOSIS — F331 Major depressive disorder, recurrent, moderate: Secondary | ICD-10-CM | POA: Diagnosis not present

## 2016-12-09 ENCOUNTER — Encounter: Payer: Self-pay | Admitting: Primary Care

## 2016-12-09 ENCOUNTER — Ambulatory Visit (INDEPENDENT_AMBULATORY_CARE_PROVIDER_SITE_OTHER): Payer: BLUE CROSS/BLUE SHIELD | Admitting: Psychology

## 2016-12-09 DIAGNOSIS — F411 Generalized anxiety disorder: Secondary | ICD-10-CM | POA: Diagnosis not present

## 2016-12-09 DIAGNOSIS — F331 Major depressive disorder, recurrent, moderate: Secondary | ICD-10-CM

## 2016-12-16 ENCOUNTER — Ambulatory Visit (INDEPENDENT_AMBULATORY_CARE_PROVIDER_SITE_OTHER): Payer: BLUE CROSS/BLUE SHIELD | Admitting: Psychology

## 2016-12-16 ENCOUNTER — Ambulatory Visit: Payer: Self-pay | Admitting: Rheumatology

## 2016-12-16 DIAGNOSIS — F411 Generalized anxiety disorder: Secondary | ICD-10-CM | POA: Diagnosis not present

## 2016-12-16 DIAGNOSIS — F331 Major depressive disorder, recurrent, moderate: Secondary | ICD-10-CM | POA: Diagnosis not present

## 2016-12-22 DIAGNOSIS — M170 Bilateral primary osteoarthritis of knee: Secondary | ICD-10-CM | POA: Insufficient documentation

## 2016-12-22 DIAGNOSIS — L659 Nonscarring hair loss, unspecified: Secondary | ICD-10-CM | POA: Insufficient documentation

## 2016-12-22 DIAGNOSIS — M19042 Primary osteoarthritis, left hand: Secondary | ICD-10-CM

## 2016-12-22 DIAGNOSIS — M797 Fibromyalgia: Secondary | ICD-10-CM | POA: Insufficient documentation

## 2016-12-22 DIAGNOSIS — Z8679 Personal history of other diseases of the circulatory system: Secondary | ICD-10-CM | POA: Insufficient documentation

## 2016-12-22 DIAGNOSIS — K1379 Other lesions of oral mucosa: Secondary | ICD-10-CM | POA: Insufficient documentation

## 2016-12-22 DIAGNOSIS — M47816 Spondylosis without myelopathy or radiculopathy, lumbar region: Secondary | ICD-10-CM | POA: Insufficient documentation

## 2016-12-22 DIAGNOSIS — M19041 Primary osteoarthritis, right hand: Secondary | ICD-10-CM | POA: Insufficient documentation

## 2016-12-22 NOTE — Progress Notes (Signed)
Office Visit Note  Patient: Lauren Winters             Date of Birth: Nov 13, 1966           MRN: [id number]             PCP: Sheral Flow, NP Referring: Pleas Koch, NP Visit Date: 12/23/2016 Occupation: @GUAROCC @    Subjective:  Pain of the Right Hand; Pain of the Left Hand; Pain of the Right Hip; Pain of the Left Hip; Pain of the Neck; and no eye exam (no eye exam on file since starting PLQ 2014) Follow-up on lupus, high-risk prescription  History of Present Illness: Lauren Winters is a 50 y.o. female  Last seen 08/12/2016. Patient is complaining of bilateral knee joint pain elbow joint pain shoulder joint pain and low back pain. She states that her knees have gotten worse over the last 2 months. She has a history of Oster arthritis of bilateral knees. She has ongoing hip pain and x-rays were done on August 2017 visit which showed bilateral mild inferiomedial joint space narrowing.  Patient has a history of degenerative disc disease of the lumbar spine causing her chronic pain. She sees Lauren Winters at [facility]. He's given her 2 injections in her back on 1 visit followed by 2 injections in her back in another visit. She states that she has no relief from those 2 injections. The plan is to do 1 more set of injections and then refer her to a back specialist for possible surgery.  As far as the lupus is concerned, patient is doing relatively well except she has ongoing oral and nasal ulcers intermittently. Currently she has some ulcers in her nose as well as her mouth and she has medication for this. This medication was prescribed by her dermatologist long time ago and she still has the "paste". Call the name of this medication. She states that it works well for her.  She uses methotrexate 0.8 ML's every week and folic acid 2 mg every day She uses Plaquenil 20 mg twice a day. We do not have any Plaquenil eye exam documentation still for this patient. At the  last visit in August 2017, Lauren Winters referred her to Dr. Zenia Winters office. According to her SRS messages, on 09/09/2016, the eye doctor called her multiple times but they were unable to reach the patient by phone for an appointment date. Patient states that she was very busy with her back problem and was unable to follow up for the eye appointment.  I cautioned the patient that we will not be able to write her any more Plaquenil because her eye exam is past due I elaborated on Plaquenil toxicity and without the proper eye exam documentation, we will no longer be able to refill her Plaquenil after this month supply. Patient understands and will go immediately for an eye exam as soon as she can find an opening in their schedule.  Patient is inquiring about long-term disability for work. She currently has multiple problems including back pain knee pain shoulder pain hip pain and the diagnosis of spinal stenosis. She is curious as to how to go about getting long-term disability for work. I explained to the patient that we neither support nor deny disability claim since were not trained in that specialty. We will document patient's problems and she is able to take the medical records to the appropriate Doctor./lawyer to support her petition for disability.  Activities of Daily  Living:  Patient reports morning stiffness for 60 minutes.   Patient Reports nocturnal pain.  Difficulty dressing/grooming: Reports Difficulty climbing stairs: Reports Difficulty getting out of chair: Reports Difficulty using hands for taps, buttons, cutlery, and/or writing: Reports   Review of Systems  Constitutional: Negative for fatigue.  HENT: Negative for mouth sores and mouth dryness.   Eyes: Negative for dryness.  Respiratory: Negative for shortness of breath.   Gastrointestinal: Negative for constipation and diarrhea.  Musculoskeletal: Negative for myalgias and myalgias.  Skin: Negative for sensitivity to  sunlight.  Psychiatric/Behavioral: Negative for decreased concentration and sleep disturbance.    PMFS History:  Patient Active Problem List   Diagnosis Date Noted  . Primary osteoarthritis of both hands 12/22/2016  . Primary osteoarthritis of both knees 12/22/2016  . Spondylosis of lumbar region without myelopathy or radiculopathy 12/22/2016  . Fibromyalgia 12/22/2016  . History of hypertension 12/22/2016  . Recurrent oral ulcers 12/22/2016  . Hair loss 12/22/2016  . Preventative health care 04/19/2016  . Chronic back pain 12/17/2015  . Hyperlipidemia 08/29/2015  . Lupus (systemic lupus erythematosus) ([place]) 08/04/2015  . Rheumatoid factor positive 08/04/2015  . Sjogren's syndrome ([place]) 08/04/2015  . Mitral regurgitation   . Tobacco abuse   . Atypical chest pain   . Obesity (BMI 30-39.9)   . Anxiety   . Hypertension   . Diabetes mellitus without complication ([place])   . Clinical depression 05/23/2014    Past Medical History:  Diagnosis Date  . Anxiety   . Atypical chest pain    a. ETT 05/2014: normal stress test without evidence of myocardial ischemia or chest pain at peak stress  . Diabetes mellitus without complication (HCC) metformin  . Fibromyalgia   . Hypertension   . Lupus (systemic lupus erythematosus) ([place])   . Mitral regurgitation    a. 05/2014 echo: EF >55%, nl LV/RV sys fxn, mild MR/AR, no valvular stenosis   . Obesity (BMI 30-39.9)   . Raynaud's phenomenon   . Rheumatoid arthritis ([place])   . Sjogren's disease ([place])   . Tobacco abuse    a. ongoing; b. ongoing SOB    Family History  Problem Relation Age of Onset  . Hypertension Mother   . Valvular heart disease Mother   . CAD Father 69    CABG x 4  . Liver cancer Father 8    passed  . CAD Paternal Aunt     CABG  . CAD Paternal Aunt     CABG  . CAD Paternal Aunt     CABG  . CAD Paternal Aunt     CABG  . CAD Paternal Aunt     CABG  . CAD Paternal Uncle     MI s/p CABG  . Stroke Maternal  Grandmother   . Stroke Maternal Grandfather   . CAD Maternal Grandfather    Past Surgical History:  Procedure Laterality Date  . ABDOMINAL HYSTERECTOMY    . CESAREAN SECTION     x3  . LUMBAR LAMINECTOMY     Social History   Social History Narrative   Married.   3 children.   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      Objective: Vital Signs: BP (!) 158/98   Pulse 90   Resp 14   Ht 5' 6.5" (1.689 m)   Wt 226 lb (102.5 kg)   BMI 35.93 kg/m    Physical Exam  Constitutional: She is oriented to person, place, and  time. She appears well-developed and well-nourished.  HENT:  Head: Normocephalic and atraumatic.  Eyes: EOM are normal. Pupils are equal, round, and reactive to light.  Cardiovascular: Normal rate, regular rhythm and normal heart sounds.  Exam reveals no gallop and no friction rub.   No murmur heard. Pulmonary/Chest: Effort normal and breath sounds normal. She has no wheezes. She has no rales.  Abdominal: Soft. Bowel sounds are normal. She exhibits no distension. There is no tenderness. There is no guarding. No hernia.  Musculoskeletal: Normal range of motion. She exhibits no edema, tenderness or deformity.  Lymphadenopathy:    She has no cervical adenopathy.  Neurological: She is alert and oriented to person, place, and time. Coordination normal.  Skin: Skin is warm and dry. Capillary refill takes less than 2 seconds. No rash noted.  Psychiatric: She has a normal mood and affect. Her behavior is normal.  Nursing note and vitals reviewed.    Musculoskeletal Exam:  Full range of motion of all joints except decreased flexion secondary to spinal stenosis. Grip strength equal and strong bilaterally Fiber myalgia tender points are all absent  CDAI Exam: CDAI Homunculus Exam:   Joint Counts:  CDAI Tender Joint count: 0 CDAI Swollen Joint count: 0  Global Assessments:  Patient Global Assessment: 9 Provider Global  Assessment: 9  CDAI Calculated Score: 18  No synovitis on examination  Investigation: Findings:  No plaquenil eye exam on file for patient.   Labs from August 2017:  CBC, comprehensive metabolic panel, sed rate, ANA, and complements were all within normal limits.  Her La antibody was positive.  Vitamin D was low at 24.  UA showed bacteria and epithelial cell.   08/12/16 X-rays of bilateral hips, 2 views, were obtained today which showed bilateral mild inferomedial joint space narrowing.     Imaging: No results found.  Speciality Comments: No specialty comments available.    Procedures:  No procedures performed Allergies: Clindamycin/lincomycin and Sudafed [pseudoephedrine hcl]   Assessment / Plan:     Visit Diagnoses: Other systemic lupus erythematosus with other organ involvement ([place]) - +ANA +La +Skin Bx subcutaneus lupus ; 12/22/2016===> ongoing intermittent +OU, +NU, FATIGUE; - Plan: ANA, Anti-DNA antibody, double-stranded, C3 and C4, Urinalysis, Routine w reflex microscopic, Sedimentation rate, VITAMIN D 25 Hydroxy (Vit-D Deficiency, Fractures), CBC with Differential/Platelet, COMPLETE METABOLIC PANEL WITH GFR  High risk medication use - Plaquenil 200 BID (plq eye exam appt --> I scheduled for Dec 23, 2016 @ [facility]); MTX [id number]; folic 2mg  qd; - Plan: ANA, Anti-DNA antibody, double-stranded, C3 and C4, Urinalysis, Routine w reflex microscopic, Sedimentation rate, VITAMIN D 25 Hydroxy (Vit-D Deficiency, Fractures), CBC with Differential/Platelet, COMPLETE METABOLIC PANEL WITH GFR  Rheumatoid factor positive  Primary osteoarthritis of both hands  Primary osteoarthritis of both knees  Spondylosis of lumbar region without myelopathy or radiculopathy  Fibromyalgia  History of hypertension  Recurrent oral ulcers  Hair loss  Chronic fatigue - Plan: VITAMIN D 25 Hydroxy (Vit-D Deficiency, Fractures)   High risk medication use   Recurrent oral ulcers  Hair loss    Plan: #1: Patient's lupus is stable. She had autoimmune workup 07/28/2016. We will do autoimmune workup today. I will also add vitamin D since it was low at 23 and we started her on vitamin D supplement.  #2: [place] prescription. Patient is doing well with her methotrexate at 0.8 ML's every week. Folic acid, 2 mg every day. Plaquenil 20 mg twice a day. Because her Plaquenil eye exam is not  up-to-date, I made an appointment for the patient with eye doctor for tomorrow at 11:30 in the morning. Patient is agreeable with this time and will definitely go. She knows that I cannot give her any more refills on Plaquenil without this eye exam. It is way past due. We will try to do this for the patient on last visit also and review of the [place] note shows that Dr. Velvet Bathe office tried to contact her multiple times to set up the appointment but she never picked up the phone area and I stressed to the patient the risks for Plaquenil toxicity and we can no longer give her Plaquenil until the eye exam is done and up-to-date.  #3: DDD of the L-spine. Note that patient has spinal stenosis. She seeing Raliegh Ip for this and they plan to refer her to a back specialist in case it worsens.  #4: Patient's blood pressure is elevated on today's visit at 158/98. I explained to the patient that this is a worrisome number and it is important for the patient to make sure that she gets this addressed by her PCP. She should also go to urgent care or ER due to the elevated blood pressure. Her last blood pressure was 186/124 on August 2017 visit and we gave her the same advice at that time.  #5: Patient's weight is 226 today with a BMI of 35.93. Patient will try her best to continue weight loss efforts. Due to her back pain, she is I'm able to do the kind of exercise she wants but she does plan to do water aerobics soon. Discussed this in detail and encouraged her to do 20 minutes 3 times a week and increase it gradually until she  gets to 50 minutes 3 times a week. Patient understands and is agreeable  #6: Patient is trying to apply for long-term disability for work. We advised the patient that we can neither support or deny disability. She can request our records to share with her agent of choice to support her request for disability.  #7: Ongoing bilateral knee joint pain which is worse for the last 2 months. She may benefit from physical supplementation in the future.  #8: Autoimmune workup today including CBC with differential CMP with GFR and then CBC with differential CMP with GFR in 2 months.  #9: Return to clinic in 4 months.   Orders: Orders Placed This Encounter  Procedures  . ANA  . Anti-DNA antibody, double-stranded  . C3 and C4  . Urinalysis, Routine w reflex microscopic  . Sedimentation rate  . VITAMIN D 25 Hydroxy (Vit-D Deficiency, Fractures)  . CBC with Differential/Platelet  . COMPLETE METABOLIC PANEL WITH GFR   Meds ordered this encounter  Medications  . hydroxychloroquine (PLAQUENIL) 200 MG tablet    Sig: Take 1 tablet (200 mg total) by mouth 2 (two) times daily.    Dispense:  60 tablet    Refill:  0    Were giving 30 day supply of this medication; once patient's Plaquenil eye exam is normal and up-to-date, we will do a 90 day supply.    Order Specific Question:   Supervising Provider    Answer:   Lyda Perone  . methotrexate 50 MG/2ML injection    Sig: Inject 0.8 mLs (20 mg total) into the skin once a week.    Dispense:  10 mL    Refill:  0    Dispense 2 ML vials with preservatives for total of 10 mL's  to last patient for 90 days minimum.    Order Specific Question:   Supervising Provider    Answer:   Lyda Perone  . folic acid (FOLVITE) 1 MG tablet    Sig: Take 2 tablets (2 mg total) by mouth daily.    Dispense:  180 tablet    Refill:  4    Order Specific Question:   Supervising Provider    Answer:   Bo Merino [phone]    Face-to-face time spent  with patient was 40 minutes. 50% of time was spent in counseling and coordination of care.  Follow-Up Instructions: Return in about 4 months (around 04/23/2017) for sle; mtx 0.24ml; plq 200bid; oa; obesity;.   Eliezer Lofts, PA-C

## 2016-12-23 ENCOUNTER — Ambulatory Visit: Payer: BLUE CROSS/BLUE SHIELD | Admitting: Psychology

## 2016-12-23 ENCOUNTER — Encounter: Payer: Self-pay | Admitting: Rheumatology

## 2016-12-23 ENCOUNTER — Ambulatory Visit (INDEPENDENT_AMBULATORY_CARE_PROVIDER_SITE_OTHER): Payer: BLUE CROSS/BLUE SHIELD | Admitting: Rheumatology

## 2016-12-23 VITALS — BP 158/98 | HR 90 | Resp 14 | Ht 66.5 in | Wt 226.0 lb

## 2016-12-23 DIAGNOSIS — K1379 Other lesions of oral mucosa: Secondary | ICD-10-CM

## 2016-12-23 DIAGNOSIS — L659 Nonscarring hair loss, unspecified: Secondary | ICD-10-CM | POA: Diagnosis not present

## 2016-12-23 DIAGNOSIS — M797 Fibromyalgia: Secondary | ICD-10-CM

## 2016-12-23 DIAGNOSIS — M3219 Other organ or system involvement in systemic lupus erythematosus: Secondary | ICD-10-CM | POA: Diagnosis not present

## 2016-12-23 DIAGNOSIS — R5382 Chronic fatigue, unspecified: Secondary | ICD-10-CM

## 2016-12-23 DIAGNOSIS — Z8679 Personal history of other diseases of the circulatory system: Secondary | ICD-10-CM | POA: Diagnosis not present

## 2016-12-23 DIAGNOSIS — M47816 Spondylosis without myelopathy or radiculopathy, lumbar region: Secondary | ICD-10-CM

## 2016-12-23 DIAGNOSIS — M170 Bilateral primary osteoarthritis of knee: Secondary | ICD-10-CM | POA: Diagnosis not present

## 2016-12-23 DIAGNOSIS — M19041 Primary osteoarthritis, right hand: Secondary | ICD-10-CM | POA: Diagnosis not present

## 2016-12-23 DIAGNOSIS — R768 Other specified abnormal immunological findings in serum: Secondary | ICD-10-CM | POA: Diagnosis not present

## 2016-12-23 DIAGNOSIS — M19042 Primary osteoarthritis, left hand: Secondary | ICD-10-CM

## 2016-12-23 DIAGNOSIS — Z79899 Other long term (current) drug therapy: Secondary | ICD-10-CM | POA: Diagnosis not present

## 2016-12-23 LAB — CBC WITH DIFFERENTIAL/PLATELET
BASOS ABS: 0 {cells}/uL (ref 0–200)
BASOS PCT: 0 %
EOS PCT: 3 %
Eosinophils Absolute: 180 cells/uL (ref 15–500)
HCT: 40.7 % (ref 35.0–45.0)
HEMOGLOBIN: 13.7 g/dL (ref 11.7–15.5)
LYMPHS ABS: 1020 {cells}/uL (ref 850–3900)
Lymphocytes Relative: 17 %
MCH: 29.8 pg (ref 27.0–33.0)
MCHC: 33.7 g/dL (ref 32.0–36.0)
MCV: 88.7 fL (ref 80.0–100.0)
MPV: 9.1 fL (ref 7.5–12.5)
Monocytes Absolute: 360 cells/uL (ref 200–950)
Monocytes Relative: 6 %
NEUTROS ABS: 4440 {cells}/uL (ref 1500–7800)
Neutrophils Relative %: 74 %
PLATELETS: 378 10*3/uL (ref 140–400)
RBC: 4.59 MIL/uL (ref 3.80–5.10)
RDW: 15 % (ref 11.0–15.0)
WBC: 6 10*3/uL (ref 3.8–10.8)

## 2016-12-23 LAB — URINALYSIS, ROUTINE W REFLEX MICROSCOPIC
Bilirubin Urine: NEGATIVE
Hgb urine dipstick: NEGATIVE
KETONES UR: NEGATIVE
Leukocytes, UA: NEGATIVE
NITRITE: NEGATIVE
PH: 5.5 (ref 5.0–8.0)
Protein, ur: NEGATIVE
SPECIFIC GRAVITY, URINE: 1.02 (ref 1.001–1.035)

## 2016-12-23 MED ORDER — FOLIC ACID 1 MG PO TABS: 2.0000 mg | ORAL_TABLET | Freq: Every day | ORAL | 4 refills | Status: DC

## 2016-12-23 MED ORDER — METHOTREXATE SODIUM CHEMO INJECTION 50 MG/2ML: 20.0000 mg | INTRAMUSCULAR | 0 refills | Status: AC

## 2016-12-23 MED ORDER — HYDROXYCHLOROQUINE SULFATE 200 MG PO TABS: 200.0000 mg | ORAL_TABLET | Freq: Two times a day (BID) | ORAL | 0 refills | Status: DC

## 2016-12-23 NOTE — Patient Instructions (Signed)
We made appointment for Plaquenil eye exam with Dr. Katy Fitch for 11:30 in the morning tomorrow. Please go a schedule since this is an urgent visit and we do not have any Plaquenil eye exam for you in our system documented.

## 2016-12-24 LAB — URINALYSIS, MICROSCOPIC ONLY
Bacteria, UA: NONE SEEN [HPF]
CASTS: NONE SEEN [LPF]
RBC / HPF: NONE SEEN RBC/HPF (ref <=2)
WBC, UA: NONE SEEN WBC/HPF (ref <=5)
YEAST: NONE SEEN [HPF]

## 2016-12-24 LAB — COMPLETE METABOLIC PANEL WITH GFR
ALBUMIN: 4.3 g/dL (ref 3.6–5.1)
ALK PHOS: 87 U/L (ref 33–130)
ALT: 11 U/L (ref 6–29)
AST: 20 U/L (ref 10–35)
BILIRUBIN TOTAL: 0.6 mg/dL (ref 0.2–1.2)
BUN: 9 mg/dL (ref 7–25)
CO2: 25 mmol/L (ref 20–31)
CREATININE: 0.81 mg/dL (ref 0.50–1.05)
Calcium: 9.6 mg/dL (ref 8.6–10.4)
Chloride: 103 mmol/L (ref 98–110)
GFR, Est African American: >89 mL/min (ref >=60)
GFR, Est Non African American: 84 mL/min (ref >=60)
GLUCOSE: 167 mg/dL — AB (ref 65–99)
Potassium: 4.2 mmol/L (ref 3.5–5.3)
SODIUM: 139 mmol/L (ref 135–146)
TOTAL PROTEIN: 7.2 g/dL (ref 6.1–8.1)

## 2016-12-24 LAB — SEDIMENTATION RATE: Sed Rate: 24 mm/hr (ref 0–30)

## 2016-12-24 LAB — ANTI-DNA ANTIBODY, DOUBLE-STRANDED: DS DNA AB: 1 [IU]/mL

## 2016-12-24 LAB — VITAMIN D 25 HYDROXY (VIT D DEFICIENCY, FRACTURES): VIT D 25 HYDROXY: 28 ng/mL — AB (ref 30–100)

## 2016-12-24 LAB — C3 AND C4
C3 Complement: 167 mg/dL (ref 90–180)
C4 Complement: 24 mg/dL (ref 16–47)

## 2016-12-24 LAB — ANA: ANA: NEGATIVE

## 2016-12-27 NOTE — Progress Notes (Signed)
Tell patient#1: C3-C4 normal#2: Double-stranded DNA is normal  #3: CBC with differential is normal#4: Vitamin D is low at 28. If not already on vitamin D treatment please send in the following: Vitamin D3 50,000 units once a week for 12 weeks; Dispense: 12 pills with no refills.#5: CMP with GFR is within normal limits except for nonfasting elevated glucose. Tell patient to monitor glucose if indicated#6: Urine analysis is within normal limits except for turbid urine#7: Send copy of labs to patient's PCP#8: Other than adding vitamin D for 3 months, there is no change in treatment

## 2016-12-29 ENCOUNTER — Telehealth: Payer: Self-pay | Admitting: *Deleted

## 2016-12-29 MED ORDER — VITAMIN D (ERGOCALCIFEROL) 1.25 MG (50000 UNIT) PO CAPS: 50000.0000 [IU] | ORAL_CAPSULE | ORAL | 0 refills | Status: DC

## 2016-12-29 NOTE — Telephone Encounter (Signed)
-----   Message from [place], [place] sent at 12/27/2016  2:05 PM EST ----- Tell patient#1: C3-C4 normal#2: Double-stranded DNA is normal  #3: CBC with differential is normal#4: Vitamin D is low at 28. If not already on vitamin D treatment please send in the following: Vitamin D3 50,000 units once a week for 12 weeks; Dispense: 12 pills with no refills.#5: CMP with GFR is within norm al limits except for nonfasting elevated glucose. Tell patient to monitor glucose if indicated#6: Urine analysis is within normal limits except for turbid urine#7: Send copy of labs to patient's PCP#8: Other than adding vitamin D for 3 months, the re is no change in treatment

## 2016-12-29 NOTE — Telephone Encounter (Signed)
Patient advised of results and prescription sent to the pharmacy.

## 2016-12-30 ENCOUNTER — Encounter: Payer: Self-pay | Admitting: Primary Care

## 2016-12-30 ENCOUNTER — Ambulatory Visit (INDEPENDENT_AMBULATORY_CARE_PROVIDER_SITE_OTHER): Payer: BLUE CROSS/BLUE SHIELD | Admitting: Psychology

## 2016-12-30 DIAGNOSIS — F331 Major depressive disorder, recurrent, moderate: Secondary | ICD-10-CM

## 2017-01-06 ENCOUNTER — Ambulatory Visit: Payer: BLUE CROSS/BLUE SHIELD | Admitting: Psychology

## 2017-01-13 ENCOUNTER — Ambulatory Visit: Payer: BLUE CROSS/BLUE SHIELD | Admitting: Psychology

## 2017-01-20 ENCOUNTER — Ambulatory Visit: Payer: Self-pay | Admitting: Psychology

## 2017-01-21 DIAGNOSIS — M4727 Other spondylosis with radiculopathy, lumbosacral region: Secondary | ICD-10-CM | POA: Insufficient documentation

## 2017-01-26 ENCOUNTER — Other Ambulatory Visit: Payer: Self-pay | Admitting: Neurological Surgery

## 2017-01-26 DIAGNOSIS — M4727 Other spondylosis with radiculopathy, lumbosacral region: Secondary | ICD-10-CM

## 2017-01-27 ENCOUNTER — Ambulatory Visit (INDEPENDENT_AMBULATORY_CARE_PROVIDER_SITE_OTHER): Payer: BLUE CROSS/BLUE SHIELD | Admitting: Psychology

## 2017-01-27 DIAGNOSIS — F331 Major depressive disorder, recurrent, moderate: Secondary | ICD-10-CM | POA: Diagnosis not present

## 2017-02-01 ENCOUNTER — Encounter: Payer: Self-pay | Admitting: Primary Care

## 2017-02-01 ENCOUNTER — Ambulatory Visit
Admission: RE | Admit: 2017-02-01 | Discharge: 2017-02-01 | Disposition: A | Discharge status: Home / Self Care | Payer: BLUE CROSS/BLUE SHIELD | Source: Ambulatory Visit | Attending: Neurological Surgery | Admitting: Neurological Surgery

## 2017-02-01 DIAGNOSIS — M4727 Other spondylosis with radiculopathy, lumbosacral region: Secondary | ICD-10-CM

## 2017-02-01 MED ORDER — GADOBENATE DIMEGLUMINE 529 MG/ML IV SOLN
20.0000 mL | Freq: Once | INTRAVENOUS | Status: AC | PRN
  Administered 2017-02-01: 20 mL via INTRAVENOUS

## 2017-02-02 ENCOUNTER — Encounter: Payer: Self-pay | Admitting: Rheumatology

## 2017-02-02 ENCOUNTER — Encounter: Payer: Self-pay | Admitting: Primary Care

## 2017-02-03 ENCOUNTER — Encounter: Payer: Self-pay | Admitting: Primary Care

## 2017-02-03 ENCOUNTER — Ambulatory Visit: Payer: BLUE CROSS/BLUE SHIELD | Admitting: Psychology

## 2017-02-09 DIAGNOSIS — M5020 Other cervical disc displacement, unspecified cervical region: Secondary | ICD-10-CM | POA: Insufficient documentation

## 2017-02-10 ENCOUNTER — Other Ambulatory Visit: Payer: Self-pay | Admitting: Neurological Surgery

## 2017-02-10 ENCOUNTER — Ambulatory Visit: Payer: Self-pay | Admitting: Psychology

## 2017-02-10 ENCOUNTER — Encounter: Payer: Self-pay | Admitting: Primary Care

## 2017-02-11 ENCOUNTER — Other Ambulatory Visit: Payer: Self-pay | Admitting: Primary Care

## 2017-02-11 DIAGNOSIS — G8929 Other chronic pain: Secondary | ICD-10-CM

## 2017-02-11 DIAGNOSIS — M545 Low back pain: Principal | ICD-10-CM

## 2017-02-17 ENCOUNTER — Ambulatory Visit (INDEPENDENT_AMBULATORY_CARE_PROVIDER_SITE_OTHER): Payer: BLUE CROSS/BLUE SHIELD | Admitting: Psychology

## 2017-02-17 DIAGNOSIS — F330 Major depressive disorder, recurrent, mild: Secondary | ICD-10-CM | POA: Diagnosis not present

## 2017-02-17 DIAGNOSIS — F410 Panic disorder [episodic paroxysmal anxiety] without agoraphobia: Secondary | ICD-10-CM

## 2017-02-18 ENCOUNTER — Encounter: Payer: Self-pay | Admitting: Primary Care

## 2017-02-18 ENCOUNTER — Ambulatory Visit (INDEPENDENT_AMBULATORY_CARE_PROVIDER_SITE_OTHER): Payer: BLUE CROSS/BLUE SHIELD | Admitting: Primary Care

## 2017-02-18 ENCOUNTER — Ambulatory Visit (INDEPENDENT_AMBULATORY_CARE_PROVIDER_SITE_OTHER)
Admission: RE | Admit: 2017-02-18 | Discharge: 2017-02-18 | Disposition: A | Discharge status: Home / Self Care | Payer: BLUE CROSS/BLUE SHIELD | Source: Ambulatory Visit | Attending: Primary Care | Admitting: Primary Care

## 2017-02-18 VITALS — BP 118/82 | HR 60 | Temp 97.5°F | Ht 66.5 in | Wt 219.1 lb

## 2017-02-18 DIAGNOSIS — M170 Bilateral primary osteoarthritis of knee: Secondary | ICD-10-CM | POA: Diagnosis not present

## 2017-02-18 DIAGNOSIS — M542 Cervicalgia: Secondary | ICD-10-CM

## 2017-02-18 DIAGNOSIS — G8929 Other chronic pain: Secondary | ICD-10-CM | POA: Diagnosis not present

## 2017-02-18 DIAGNOSIS — M25511 Pain in right shoulder: Secondary | ICD-10-CM | POA: Diagnosis not present

## 2017-02-18 MED ORDER — METHOCARBAMOL 500 MG PO TABS: 500.0000 mg | ORAL_TABLET | Freq: Three times a day (TID) | ORAL | 0 refills | Status: DC | PRN

## 2017-02-18 NOTE — Assessment & Plan Note (Signed)
Will be getting injections from rheumatologist soon

## 2017-02-18 NOTE — Progress Notes (Signed)
Subjective:    Patient ID: Lauren Winters, female    DOB: 1966-04-26, 51 y.o.   MRN: [id number]  HPI  Lauren Winters is a 51 year old female with a history of chronic back, hip pain, spondylosis of the lumbar spine, osteoarthritis, lupus who presents today with a chief complaint of shoulder pain. She also reports numbness to her bilateral hands.   She recently saw her rheumatologist recently who will be doing "injections" to her knees. Her pain is located to the right shoulder that has been present since December 2017. She's also noticed numbness to her right upper extremity which has progressed and is now down to her fingers. She does have neck pain and weakness to her right shoulder. She's having difficulty opening jars and pumping the gas due to hand pain. She mentioned these symptoms to her neurosurgeon who thought it was a "pulled muscle".   She is waiting on an appointment with another neurosurgeon for a second opinion regarding her chronic back/hip issues. She is skeptical to undergo the same surgery as recommend by her current neurosurgeon as her first surgery was unsuccessful.   She has a form from her lawyer that needs to be completed for her application process for social security. She will then undergo formal review and examination for her chronic back/hip/lower extremity issues. She is currently on long term disability.   Review of Systems  Musculoskeletal: Positive for arthralgias and back pain. Negative for joint swelling.  Skin: Negative for color change.  Neurological: Positive for weakness and numbness.       Past Medical History:  Diagnosis Date  . Anxiety   . Atypical chest pain    a. ETT 05/2014: normal stress test without evidence of myocardial ischemia or chest pain at peak stress  . Diabetes mellitus without complication (HCC) metformin  . Fibromyalgia   . Hypertension   . Lupus (systemic lupus erythematosus) ([place])   . Mitral regurgitation    a. 05/2014 echo: EF  >55%, nl LV/RV sys fxn, mild MR/AR, no valvular stenosis   . Obesity (BMI 30-39.9)   . Raynaud's phenomenon   . Rheumatoid arthritis ([place])   . Sjogren's disease ([place])   . Tobacco abuse    a. ongoing; b. ongoing SOB     Social History   Social History  . Marital status: Married    Spouse name: N/A  . Number of children: N/A  . Years of education: N/A   Occupational History  . Not on file.   Social History Main Topics  . Smoking status: Former Smoker    Packs/day: 0.50    Years: 10.00    Types: Cigarettes    Quit date: 07/22/2015  . Smokeless tobacco: Never Used  . Alcohol use No  . Drug use: No  . Sexual activity: Not on file   Other Topics Concern  . Not on file   Social History Narrative   Married.   3 children.   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     Past Surgical History:  Procedure Laterality Date  . ABDOMINAL HYSTERECTOMY    . CESAREAN SECTION     x3  . LUMBAR LAMINECTOMY      Family History  Problem Relation Age of Onset  . Hypertension Mother   . Valvular heart disease Mother   . CAD Father 108    CABG x 4  . Liver cancer Father 52    passed  .  CAD Paternal Aunt     CABG  . CAD Paternal Aunt     CABG  . CAD Paternal Aunt     CABG  . CAD Paternal Aunt     CABG  . CAD Paternal Aunt     CABG  . CAD Paternal Uncle     MI s/p CABG  . Stroke Maternal Grandmother   . Stroke Maternal Grandfather   . CAD Maternal Grandfather     Allergies  Allergen Reactions  . Clindamycin/Lincomycin Hives  . Sudafed [Pseudoephedrine Hcl] Other (See Comments)    Dizziness     Current Outpatient Prescriptions on File Prior to Visit  Medication Sig Dispense Refill  . amLODipine (NORVASC) 5 MG tablet Take 1 tablet (5 mg total) by mouth 2 (two) times daily. 180 tablet 2  . buPROPion (WELLBUTRIN XL) 150 MG 24 hr tablet Take 1 tablet (150 mg total) by mouth daily. 30 tablet 5  . Cholecalciferol (VITAMIN D3) 50000 UNITS  CAPS Take 1 capsule by mouth once a week.    . Clobetasol Prop Crea-Coal Tar (CLOBETA CREAM EX) Apply 1 application topically as needed.    . cloNIDine (CATAPRES) 0.1 MG tablet Take 1 tablet (0.1 mg total) by mouth daily. 30 tablet 1  . escitalopram (LEXAPRO) 20 MG tablet Take 1 tablet (20 mg total) by mouth daily. 90 tablet 1  . folic acid (FOLVITE) 1 MG tablet Take 2 tablets (2 mg total) by mouth daily. 180 tablet 4  . gabapentin (NEURONTIN) 300 MG capsule Take 300 mg by mouth 2 (two) times daily.   0  . Halcinonide 0.1 % OINT Apply 1 application topically 2 (two) times daily as needed.    . hydroxychloroquine (PLAQUENIL) 200 MG tablet Take 1 tablet (200 mg total) by mouth 2 (two) times daily. 60 tablet 0  . hydrOXYzine (ATARAX/VISTARIL) 25 MG tablet Take 1 tablet (25 mg total) by mouth 2 (two) times daily as needed for anxiety. 30 tablet 0  . metFORMIN (GLUCOPHAGE) 1000 MG tablet Take 1 tablet (1,000 mg total) by mouth 2 (two) times daily with a meal. 180 tablet 3  . methotrexate 50 MG/2ML injection Inject 0.8 mLs (20 mg total) into the skin once a week. 10 mL 0  . metoprolol (LOPRESSOR) 100 MG tablet Take 1 tablet (100 mg total) by mouth 2 (two) times daily. 60 tablet 3  . pilocarpine (SALAGEN) 5 MG tablet Take 5 mg by mouth 3 (three) times daily.    . tacrolimus (PROTOPIC) 0.1 % ointment Apply 1 application topically 2 (two) times daily as needed.    . Triamcinolone Acetonide (TRIAMCINOLONE 0.1 % CREAM : EUCERIN) CREA Apply 1 application topically as needed.    . valsartan-hydrochlorothiazide (DIOVAN-HCT) 320-25 MG tablet Take 1 tablet by mouth daily. 90 tablet 3  . Vitamin D, Ergocalciferol, (DRISDOL) 50000 units CAPS capsule Take 1 capsule (50,000 Units total) by mouth every 7 (seven) days. 12 capsule 0   No current facility-administered medications on file prior to visit.     BP 118/82   Pulse 60   Temp 97.5 F (36.4 C) (Oral)   Ht 5' 6.5" (1.689 m)   Wt 219 lb 1.9 oz (99.4 kg)    SpO2 98%   BMI 34.84 kg/m    Objective:   Physical Exam  Constitutional: She appears well-nourished.  Cardiovascular: Normal rate and regular rhythm.   Pulmonary/Chest: Effort normal and breath sounds normal.  Musculoskeletal:       Right shoulder:  She exhibits decreased range of motion and pain. She exhibits no tenderness, no bony tenderness, no spasm and normal strength.       Cervical back: She exhibits decreased range of motion, pain and spasm. She exhibits no tenderness.  Skin: Skin is warm and dry.          Assessment & Plan:

## 2017-02-18 NOTE — Patient Instructions (Addendum)
Complete xray(s) prior to leaving today. I will notify you of your results once received.  You may take the methocarbamol 500 mg tablets every 8 hours as needed for muscle spasms.  Follow up with the neurosurgeon as discussed.  I will notify you once I've completed your form.  It was a pleasure to see you today!

## 2017-02-18 NOTE — Assessment & Plan Note (Signed)
Due to undergo repeat surgery from December 2016 per neurosurgery. She is waiting on an appointment for a second opinion. Will complete paperwork for her social security application. Overall no improvement in symptoms.

## 2017-02-18 NOTE — Assessment & Plan Note (Signed)
Also with chronic neck pain. Numbness to right upper extremity for months, worse now. Exam today with decrease in ROM given pain. No weakness. Suspect nerve entrapment to be causing numbness, not sure if this is coming from shoulder or neck.  Will xray both today. Trial muscle relaxer. She will also discuss these symptoms with her neurosurgeon.

## 2017-02-18 NOTE — Progress Notes (Signed)
Pre visit review using our clinic review tool, if applicable. No additional management support is needed unless otherwise documented below in the visit note. 

## 2017-02-23 ENCOUNTER — Encounter: Payer: Self-pay | Admitting: Primary Care

## 2017-02-24 ENCOUNTER — Ambulatory Visit (INDEPENDENT_AMBULATORY_CARE_PROVIDER_SITE_OTHER): Payer: BLUE CROSS/BLUE SHIELD | Admitting: Psychology

## 2017-02-24 DIAGNOSIS — F331 Major depressive disorder, recurrent, moderate: Secondary | ICD-10-CM

## 2017-02-25 DIAGNOSIS — M961 Postlaminectomy syndrome, not elsewhere classified: Secondary | ICD-10-CM | POA: Insufficient documentation

## 2017-02-27 ENCOUNTER — Encounter: Payer: Self-pay | Admitting: Primary Care

## 2017-03-03 ENCOUNTER — Ambulatory Visit: Payer: BLUE CROSS/BLUE SHIELD | Admitting: Psychology

## 2017-03-10 ENCOUNTER — Ambulatory Visit (INDEPENDENT_AMBULATORY_CARE_PROVIDER_SITE_OTHER): Payer: BLUE CROSS/BLUE SHIELD | Admitting: Psychology

## 2017-03-10 DIAGNOSIS — F331 Major depressive disorder, recurrent, moderate: Secondary | ICD-10-CM | POA: Diagnosis not present

## 2017-03-10 DIAGNOSIS — F410 Panic disorder [episodic paroxysmal anxiety] without agoraphobia: Secondary | ICD-10-CM

## 2017-03-17 ENCOUNTER — Ambulatory Visit (INDEPENDENT_AMBULATORY_CARE_PROVIDER_SITE_OTHER): Payer: BLUE CROSS/BLUE SHIELD | Admitting: Psychology

## 2017-03-17 DIAGNOSIS — F411 Generalized anxiety disorder: Secondary | ICD-10-CM

## 2017-03-21 ENCOUNTER — Encounter (HOSPITAL_COMMUNITY)
Admission: RE | Admit: 2017-03-21 | Discharge: 2017-03-21 | Disposition: A | Discharge status: Home / Self Care | Payer: BLUE CROSS/BLUE SHIELD | Source: Ambulatory Visit | Attending: Neurological Surgery | Admitting: Neurological Surgery

## 2017-03-21 ENCOUNTER — Encounter (HOSPITAL_COMMUNITY): Payer: Self-pay

## 2017-03-21 DIAGNOSIS — Z01818 Encounter for other preprocedural examination: Secondary | ICD-10-CM | POA: Diagnosis present

## 2017-03-21 HISTORY — DX: Major depressive disorder, single episode, unspecified: F32.9

## 2017-03-21 HISTORY — DX: Depression, unspecified: F32.A

## 2017-03-21 HISTORY — DX: Cardiac murmur, unspecified: R01.1

## 2017-03-21 LAB — GLUCOSE, CAPILLARY: GLUCOSE-CAPILLARY: 167 mg/dL — AB (ref 65–99)

## 2017-03-21 LAB — CBC
HCT: 41.7 % (ref 36.0–46.0)
HEMOGLOBIN: 14.1 g/dL (ref 12.0–15.0)
MCH: 28.3 pg (ref 26.0–34.0)
MCHC: 33.8 g/dL (ref 30.0–36.0)
MCV: 83.7 fL (ref 78.0–100.0)
PLATELETS: 274 10*3/uL (ref 150–400)
RBC: 4.98 MIL/uL (ref 3.87–5.11)
RDW: 12.9 % (ref 11.5–15.5)
WBC: 7.8 10*3/uL (ref 4.0–10.5)

## 2017-03-21 LAB — BASIC METABOLIC PANEL
Anion gap: 11 (ref 5–15)
BUN: 6 mg/dL (ref 6–20)
CO2: 28 mmol/L (ref 22–32)
Calcium: 10 mg/dL (ref 8.9–10.3)
Chloride: 99 mmol/L — ABNORMAL LOW (ref 101–111)
Creatinine, Ser: 0.82 mg/dL (ref 0.44–1.00)
GFR calc non Af Amer: >60 mL/min (ref >60)
Glucose, Bld: 137 mg/dL — ABNORMAL HIGH (ref 65–99)
POTASSIUM: 3.5 mmol/L (ref 3.5–5.1)
Sodium: 138 mmol/L (ref 135–145)

## 2017-03-21 LAB — SURGICAL PCR SCREEN
MRSA, PCR: NEGATIVE
STAPHYLOCOCCUS AUREUS: NEGATIVE

## 2017-03-21 NOTE — Pre-Procedure Instructions (Signed)
    Lauren Winters  03/21/2017      [id number] - [place], [place] - [address] [place] [address] Phone: [phone] Fax: [phone]    Your procedure is scheduled on Tuesday, April 3rd   Report to [facility] Admitting at 7:45 am             (posted surgery time 9:55am - 12:05pm)   Call this number if you have problems the MORNING of surgery:  [phone] or [phone] Mon - Fri from 8-4:30pm   Remember:  Do not eat food or drink liquids after midnight Monday.   Take these medicines the morning of surgery with A SIP OF WATER : Norvasc, Wellbutrin, Clonidine, Citalopram, Neurontin, Metoprolol.               4-5 days prior to surgery, STOP taking any herbal supplements, vitamins, anti-inflammatories.   Do not wear jewelry, make-up or nail polish.  Do not wear lotions, powders, perfumes, or deoderant.  Do not shave underarms & legs 48 hours prior to surgery.     Do not bring valuables to the hospital.  [facility] is not responsible for any belongings or valuables.  Contacts, dentures or bridgework may not be worn into surgery.  Leave your suitcase in the car.  After surgery it may be brought to your room.  For patients admitted to the hospital, discharge time will be determined by your treatment team.  Special instructions: Do Not take your diabetes medication the morning of surgery.  Please read over the following fact sheets that you were given. Pain Booklet, MRSA Information and Surgical Site Infection Prevention

## 2017-03-21 NOTE — Progress Notes (Signed)
PCP is Dr. Hester Mates (she also manages her diabetes) LOV 02/2017 Back in 2016, was having chest discomfort, went to PCP, did EKG --abnormal, so was sent to cardiologist.  W/u revealed no cardiac issues, but  the heart issue would get worse with steroids.   A1C 05/2016 6.8   She does not hardly check her blood sugars now.  Unable to ascertain what her baseline sugar is.

## 2017-03-22 LAB — HEMOGLOBIN A1C
Hgb A1c MFr Bld: 7.4 % — ABNORMAL HIGH (ref 4.8–5.6)
MEAN PLASMA GLUCOSE: 166 mg/dL

## 2017-03-23 ENCOUNTER — Encounter: Payer: Self-pay | Admitting: Primary Care

## 2017-03-23 NOTE — Progress Notes (Addendum)
Anesthesia Chart Review:  Pt is a 51 year old female scheduled for right L3-4 laminectomy and discectomy, left L3-4, bilateral L4-5 laminectomy for decompression on 03/29/2017 with Cyndy Freeze, MD.   - PCP is Alma Friendly, NP who is aware of upcoming surgery.   - Saw Kathlyn [place], MD with cardiology in 2016 for chest pain; he felt it was atypical chest pain. Stress test and echo ordered, results below.  Pt does not routinely see cardiology.   PMH includes:  HTN, heart murmur, DM, lupus, RA, Raynaud's, Sjogren's. Former smoker. BMI 34.  Medications include: Amlodipine, clonidine, Plaquenil, metformin, methotrexate, metoprolol, valsartan-HCTZ  Preoperative labs reviewed.  HbA1c 7.4, glucose 137.  EKG 03/21/17: NSR. Minimal voltage criteria for LVH, may be normal variant  Nuclear stress test 08/01/15:  - Exercise myocardial perfusion imaging study with no significant ischemic ischemia. Normal wall motion, EF estimated at 53%. No EKG changes concerning for ischemia. Low risk scan  Echo 07/31/15:  - Left ventricle: The cavity size was normal. Systolic function was normal. The estimated ejection fraction was in the range of 60% to 65%. Wall motion was normal; there were no regional wall motion abnormalities. Doppler parameters are consistent with abnormal left ventricular relaxation (grade 1 diastolic dysfunction). - Aortic valve: There was mild regurgitation. - Mitral valve: There was mild regurgitation. - Left atrium: The atrium was mildly dilated. - Right ventricle: Systolic function was normal. - Pulmonary arteries: Systolic pressure was within the normal range.  If no changes, I anticipate pt can proceed with surgery as scheduled.   Willeen Cass, FNP-BC [facility]/Anesthesiology Phone: [phone] 03/23/2017 3:16 PM

## 2017-03-24 ENCOUNTER — Encounter: Payer: Self-pay | Admitting: Primary Care

## 2017-03-24 ENCOUNTER — Other Ambulatory Visit: Payer: Self-pay | Admitting: Primary Care

## 2017-03-24 ENCOUNTER — Ambulatory Visit: Payer: BLUE CROSS/BLUE SHIELD | Admitting: Psychology

## 2017-03-24 DIAGNOSIS — I351 Nonrheumatic aortic (valve) insufficiency: Secondary | ICD-10-CM

## 2017-03-24 DIAGNOSIS — I340 Nonrheumatic mitral (valve) insufficiency: Secondary | ICD-10-CM

## 2017-03-29 ENCOUNTER — Ambulatory Visit (HOSPITAL_COMMUNITY): Payer: BLUE CROSS/BLUE SHIELD | Admitting: Anesthesiology

## 2017-03-29 ENCOUNTER — Inpatient Hospital Stay (HOSPITAL_COMMUNITY)
Admission: RE | Admit: 2017-03-29 | Discharge: 2017-03-31 | DRG: 520 | Disposition: A | Discharge status: Home Health | Payer: BLUE CROSS/BLUE SHIELD | Source: Ambulatory Visit | Attending: Neurological Surgery | Admitting: Neurological Surgery

## 2017-03-29 ENCOUNTER — Ambulatory Visit (HOSPITAL_COMMUNITY): Payer: BLUE CROSS/BLUE SHIELD | Admitting: Emergency Medicine

## 2017-03-29 ENCOUNTER — Encounter (HOSPITAL_COMMUNITY): Payer: Self-pay | Admitting: Critical Care Medicine

## 2017-03-29 ENCOUNTER — Encounter (HOSPITAL_COMMUNITY)
Admission: RE | Disposition: A | Discharge status: Home Health | Payer: Self-pay | Source: Ambulatory Visit | Attending: Neurological Surgery

## 2017-03-29 ENCOUNTER — Ambulatory Visit (HOSPITAL_COMMUNITY): Payer: BLUE CROSS/BLUE SHIELD

## 2017-03-29 DIAGNOSIS — M4727 Other spondylosis with radiculopathy, lumbosacral region: Secondary | ICD-10-CM | POA: Diagnosis present

## 2017-03-29 DIAGNOSIS — M79606 Pain in leg, unspecified: Secondary | ICD-10-CM | POA: Diagnosis present

## 2017-03-29 DIAGNOSIS — M069 Rheumatoid arthritis, unspecified: Secondary | ICD-10-CM | POA: Diagnosis present

## 2017-03-29 DIAGNOSIS — M797 Fibromyalgia: Secondary | ICD-10-CM | POA: Diagnosis present

## 2017-03-29 DIAGNOSIS — I10 Essential (primary) hypertension: Secondary | ICD-10-CM | POA: Diagnosis present

## 2017-03-29 DIAGNOSIS — M3500 Sicca syndrome, unspecified: Secondary | ICD-10-CM | POA: Diagnosis present

## 2017-03-29 DIAGNOSIS — M5126 Other intervertebral disc displacement, lumbar region: Secondary | ICD-10-CM | POA: Diagnosis present

## 2017-03-29 DIAGNOSIS — Z7984 Long term (current) use of oral hypoglycemic drugs: Secondary | ICD-10-CM | POA: Diagnosis not present

## 2017-03-29 DIAGNOSIS — F329 Major depressive disorder, single episode, unspecified: Secondary | ICD-10-CM | POA: Diagnosis present

## 2017-03-29 DIAGNOSIS — M48061 Spinal stenosis, lumbar region without neurogenic claudication: Secondary | ICD-10-CM | POA: Diagnosis present

## 2017-03-29 DIAGNOSIS — Z87891 Personal history of nicotine dependence: Secondary | ICD-10-CM | POA: Diagnosis not present

## 2017-03-29 DIAGNOSIS — M5116 Intervertebral disc disorders with radiculopathy, lumbar region: Principal | ICD-10-CM | POA: Diagnosis present

## 2017-03-29 DIAGNOSIS — Z888 Allergy status to other drugs, medicaments and biological substances status: Secondary | ICD-10-CM

## 2017-03-29 DIAGNOSIS — M329 Systemic lupus erythematosus, unspecified: Secondary | ICD-10-CM | POA: Diagnosis present

## 2017-03-29 DIAGNOSIS — I7300 Raynaud's syndrome without gangrene: Secondary | ICD-10-CM | POA: Diagnosis present

## 2017-03-29 DIAGNOSIS — I340 Nonrheumatic mitral (valve) insufficiency: Secondary | ICD-10-CM | POA: Diagnosis present

## 2017-03-29 DIAGNOSIS — F419 Anxiety disorder, unspecified: Secondary | ICD-10-CM | POA: Diagnosis present

## 2017-03-29 DIAGNOSIS — Z419 Encounter for procedure for purposes other than remedying health state, unspecified: Secondary | ICD-10-CM

## 2017-03-29 DIAGNOSIS — Z91048 Other nonmedicinal substance allergy status: Secondary | ICD-10-CM

## 2017-03-29 DIAGNOSIS — E119 Type 2 diabetes mellitus without complications: Secondary | ICD-10-CM | POA: Diagnosis present

## 2017-03-29 HISTORY — PX: LUMBAR LAMINECTOMY/DECOMPRESSION MICRODISCECTOMY: SHX5026

## 2017-03-29 LAB — GLUCOSE, CAPILLARY
Glucose-Capillary: 219 mg/dL — ABNORMAL HIGH (ref 65–99)
Glucose-Capillary: 209 mg/dL — ABNORMAL HIGH (ref 65–99)
Glucose-Capillary: 270 mg/dL — ABNORMAL HIGH (ref 65–99)
Glucose-Capillary: 331 mg/dL — ABNORMAL HIGH (ref 65–99)

## 2017-03-29 SURGERY — LUMBAR LAMINECTOMY/DECOMPRESSION MICRODISCECTOMY 2 LEVELS
Anesthesia: General | Site: Spine Lumbar

## 2017-03-29 MED ORDER — SUGAMMADEX SODIUM 200 MG/2ML IV SOLN
INTRAVENOUS | Status: DC | PRN
  Administered 2017-03-29: 200 mg via INTRAVENOUS

## 2017-03-29 MED ORDER — PROPOFOL 10 MG/ML IV BOLUS
INTRAVENOUS | Status: AC
  Filled 2017-03-29: qty 20

## 2017-03-29 MED ORDER — DEXAMETHASONE SODIUM PHOSPHATE 10 MG/ML IJ SOLN
INTRAMUSCULAR | Status: DC | PRN
  Administered 2017-03-29: 4 mg via INTRAVENOUS

## 2017-03-29 MED ORDER — OXYCODONE HCL 5 MG/5ML PO SOLN: 5.0000 mg | Freq: Once | ORAL | Status: AC | PRN

## 2017-03-29 MED ORDER — CHLORHEXIDINE GLUCONATE CLOTH 2 % EX PADS: 6.0000 | MEDICATED_PAD | Freq: Once | CUTANEOUS | Status: DC

## 2017-03-29 MED ORDER — CELECOXIB 200 MG PO CAPS
200.0000 mg | ORAL_CAPSULE | ORAL | Status: AC
  Administered 2017-03-29: 200 mg via ORAL

## 2017-03-29 MED ORDER — DEXAMETHASONE SODIUM PHOSPHATE 10 MG/ML IJ SOLN
INTRAMUSCULAR | Status: AC
  Filled 2017-03-29: qty 1

## 2017-03-29 MED ORDER — SODIUM CHLORIDE 0.9 % IJ SOLN
INTRAMUSCULAR | Status: AC
  Filled 2017-03-29: qty 10

## 2017-03-29 MED ORDER — SENNOSIDES-DOCUSATE SODIUM 8.6-50 MG PO TABS: 1.0000 | ORAL_TABLET | Freq: Every evening | ORAL | Status: DC | PRN

## 2017-03-29 MED ORDER — SODIUM CHLORIDE 0.9 % IV SOLN: INTRAVENOUS | Status: DC

## 2017-03-29 MED ORDER — ROCURONIUM BROMIDE 50 MG/5ML IV SOSY
PREFILLED_SYRINGE | INTRAVENOUS | Status: AC
  Filled 2017-03-29: qty 5

## 2017-03-29 MED ORDER — INSULIN ASPART 100 UNIT/ML ~~LOC~~ SOLN
0.0000 [IU] | Freq: Three times a day (TID) | SUBCUTANEOUS | Status: DC
  Administered 2017-03-29: 8 [IU] via SUBCUTANEOUS
  Administered 2017-03-30: 3 [IU] via SUBCUTANEOUS
  Administered 2017-03-30: 5 [IU] via SUBCUTANEOUS
  Administered 2017-03-30: 3 [IU] via SUBCUTANEOUS
  Administered 2017-03-31 (×2): 2 [IU] via SUBCUTANEOUS

## 2017-03-29 MED ORDER — BUPIVACAINE LIPOSOME 1.3 % IJ SUSP
20.0000 mL | INTRAMUSCULAR | Status: AC
  Administered 2017-03-29: 20 mL
  Filled 2017-03-29: qty 20

## 2017-03-29 MED ORDER — CEFAZOLIN SODIUM-DEXTROSE 2-4 GM/100ML-% IV SOLN
2.0000 g | INTRAVENOUS | Status: AC
  Administered 2017-03-29: 2 g via INTRAVENOUS

## 2017-03-29 MED ORDER — METHYLPREDNISOLONE ACETATE 80 MG/ML IJ SUSP
INTRAMUSCULAR | Status: DC | PRN
Start: 2017-03-29 — End: 2017-03-29
  Administered 2017-03-29: 80 mg

## 2017-03-29 MED ORDER — SODIUM CHLORIDE 0.9 % IR SOLN
Status: DC | PRN
  Administered 2017-03-29: 500 mL

## 2017-03-29 MED ORDER — HYDROCODONE-ACETAMINOPHEN 7.5-325 MG PO TABS
1.0000 | ORAL_TABLET | Freq: Four times a day (QID) | ORAL | Status: DC
  Administered 2017-03-29 – 2017-03-30 (×3): 1 via ORAL
  Filled 2017-03-29 (×3): qty 1

## 2017-03-29 MED ORDER — FENTANYL CITRATE (PF) 250 MCG/5ML IJ SOLN
INTRAMUSCULAR | Status: AC
  Filled 2017-03-29: qty 5

## 2017-03-29 MED ORDER — ONDANSETRON HCL 4 MG PO TABS: 4.0000 mg | ORAL_TABLET | Freq: Four times a day (QID) | ORAL | Status: DC | PRN

## 2017-03-29 MED ORDER — ONDANSETRON HCL 4 MG/2ML IJ SOLN: 4.0000 mg | Freq: Four times a day (QID) | INTRAMUSCULAR | Status: DC | PRN

## 2017-03-29 MED ORDER — ONDANSETRON HCL 4 MG/2ML IJ SOLN
INTRAMUSCULAR | Status: DC | PRN
  Administered 2017-03-29: 4 mg via INTRAVENOUS

## 2017-03-29 MED ORDER — SENNA 8.6 MG PO TABS
1.0000 | ORAL_TABLET | Freq: Two times a day (BID) | ORAL | Status: DC
  Administered 2017-03-29 – 2017-03-31 (×5): 8.6 mg via ORAL
  Filled 2017-03-29 (×5): qty 1

## 2017-03-29 MED ORDER — THROMBIN 20000 UNITS EX SOLR
CUTANEOUS | Status: DC | PRN
  Administered 2017-03-29: 20 mL via TOPICAL

## 2017-03-29 MED ORDER — LIDOCAINE-EPINEPHRINE 2 %-1:100000 IJ SOLN
INTRAMUSCULAR | Status: DC | PRN
  Administered 2017-03-29: 12.5 mL

## 2017-03-29 MED ORDER — DOCUSATE SODIUM 100 MG PO CAPS
100.0000 mg | ORAL_CAPSULE | Freq: Two times a day (BID) | ORAL | Status: DC
  Administered 2017-03-29 – 2017-03-31 (×5): 100 mg via ORAL
  Filled 2017-03-29 (×5): qty 1

## 2017-03-29 MED ORDER — PANTOPRAZOLE SODIUM 40 MG IV SOLR
40.0000 mg | Freq: Every day | INTRAVENOUS | Status: DC
  Administered 2017-03-29: 40 mg via INTRAVENOUS
  Filled 2017-03-29: qty 40

## 2017-03-29 MED ORDER — KETOROLAC TROMETHAMINE 30 MG/ML IJ SOLN
INTRAMUSCULAR | Status: AC
  Filled 2017-03-29: qty 1

## 2017-03-29 MED ORDER — EPHEDRINE SULFATE-NACL 50-0.9 MG/10ML-% IV SOSY
PREFILLED_SYRINGE | INTRAVENOUS | Status: DC | PRN
  Administered 2017-03-29: 10 mg via INTRAVENOUS
  Administered 2017-03-29: 5 mg via INTRAVENOUS

## 2017-03-29 MED ORDER — ONDANSETRON HCL 4 MG/2ML IJ SOLN: 4.0000 mg | Freq: Once | INTRAMUSCULAR | Status: DC | PRN

## 2017-03-29 MED ORDER — FENTANYL CITRATE (PF) 100 MCG/2ML IJ SOLN: 25.0000 ug | INTRAMUSCULAR | Status: DC | PRN

## 2017-03-29 MED ORDER — ROCURONIUM BROMIDE 10 MG/ML (PF) SYRINGE
PREFILLED_SYRINGE | INTRAVENOUS | Status: DC | PRN
  Administered 2017-03-29: 20 mg via INTRAVENOUS
  Administered 2017-03-29: 50 mg via INTRAVENOUS

## 2017-03-29 MED ORDER — METHYLPREDNISOLONE ACETATE 80 MG/ML IJ SUSP
INTRAMUSCULAR | Status: AC
  Filled 2017-03-29: qty 1

## 2017-03-29 MED ORDER — MIDAZOLAM HCL 2 MG/2ML IJ SOLN
INTRAMUSCULAR | Status: AC
  Filled 2017-03-29: qty 2

## 2017-03-29 MED ORDER — LACTATED RINGERS IV SOLN
INTRAVENOUS | Status: DC | PRN
  Administered 2017-03-29 (×2): via INTRAVENOUS

## 2017-03-29 MED ORDER — THROMBIN 20000 UNITS EX SOLR
CUTANEOUS | Status: AC
  Filled 2017-03-29: qty 20000

## 2017-03-29 MED ORDER — FENTANYL CITRATE (PF) 100 MCG/2ML IJ SOLN
INTRAMUSCULAR | Status: DC | PRN
  Administered 2017-03-29: 25 ug via INTRAVENOUS
  Administered 2017-03-29: 100 ug via INTRAVENOUS
  Administered 2017-03-29: 25 ug via INTRAVENOUS
  Administered 2017-03-29 (×2): 50 ug via INTRAVENOUS

## 2017-03-29 MED ORDER — MENTHOL 3 MG MT LOZG: 1.0000 | LOZENGE | OROMUCOSAL | Status: DC | PRN

## 2017-03-29 MED ORDER — FLEET ENEMA 7-19 GM/118ML RE ENEM: 1.0000 | ENEMA | Freq: Once | RECTAL | Status: DC | PRN

## 2017-03-29 MED ORDER — THROMBIN 5000 UNITS EX SOLR
CUTANEOUS | Status: AC
  Filled 2017-03-29: qty 5000

## 2017-03-29 MED ORDER — BUPIVACAINE HCL (PF) 0.25 % IJ SOLN
INTRAMUSCULAR | Status: DC | PRN
  Administered 2017-03-29: 12.5 mL
  Administered 2017-03-29: 1 mL

## 2017-03-29 MED ORDER — KETOROLAC TROMETHAMINE 30 MG/ML IJ SOLN
INTRAMUSCULAR | Status: DC | PRN
  Administered 2017-03-29: 30 mg via INTRAMUSCULAR

## 2017-03-29 MED ORDER — MIDAZOLAM HCL 5 MG/5ML IJ SOLN
INTRAMUSCULAR | Status: DC | PRN
  Administered 2017-03-29: 2 mg via INTRAVENOUS

## 2017-03-29 MED ORDER — CEFAZOLIN SODIUM-DEXTROSE 2-4 GM/100ML-% IV SOLN
2.0000 g | Freq: Three times a day (TID) | INTRAVENOUS | Status: AC
  Administered 2017-03-29 (×2): 2 g via INTRAVENOUS
  Filled 2017-03-29 (×2): qty 100

## 2017-03-29 MED ORDER — CYCLOBENZAPRINE HCL 10 MG PO TABS
10.0000 mg | ORAL_TABLET | Freq: Three times a day (TID) | ORAL | Status: DC | PRN
  Administered 2017-03-29 – 2017-03-31 (×5): 10 mg via ORAL
  Filled 2017-03-29 (×4): qty 1

## 2017-03-29 MED ORDER — BISACODYL 10 MG RE SUPP
10.0000 mg | Freq: Every day | RECTAL | Status: DC | PRN
  Administered 2017-03-30: 10 mg via RECTAL
  Filled 2017-03-29: qty 1

## 2017-03-29 MED ORDER — CEFAZOLIN SODIUM-DEXTROSE 2-4 GM/100ML-% IV SOLN
INTRAVENOUS | Status: AC
  Filled 2017-03-29: qty 100

## 2017-03-29 MED ORDER — PHENOL 1.4 % MT LIQD
1.0000 | OROMUCOSAL | Status: DC | PRN
Start: 2017-03-29 — End: 2017-03-31

## 2017-03-29 MED ORDER — CYCLOBENZAPRINE HCL 10 MG PO TABS
ORAL_TABLET | ORAL | Status: AC
  Filled 2017-03-29: qty 1

## 2017-03-29 MED ORDER — OXYCODONE HCL 5 MG PO TABS
5.0000 mg | ORAL_TABLET | Freq: Once | ORAL | Status: AC | PRN
  Administered 2017-03-29: 5 mg via ORAL

## 2017-03-29 MED ORDER — ACETAMINOPHEN 325 MG PO TABS
650.0000 mg | ORAL_TABLET | ORAL | Status: DC | PRN
  Administered 2017-03-29 – 2017-03-30 (×3): 650 mg via ORAL
  Filled 2017-03-29 (×3): qty 2

## 2017-03-29 MED ORDER — INSULIN ASPART 100 UNIT/ML ~~LOC~~ SOLN
0.0000 [IU] | Freq: Every day | SUBCUTANEOUS | Status: DC
  Administered 2017-03-29: 4 [IU] via SUBCUTANEOUS

## 2017-03-29 MED ORDER — GELATIN ABSORBABLE MT POWD
OROMUCOSAL | Status: DC | PRN
  Administered 2017-03-29 (×2): 5 mL via TOPICAL

## 2017-03-29 MED ORDER — LIDOCAINE 2% (20 MG/ML) 5 ML SYRINGE
INTRAMUSCULAR | Status: DC | PRN
  Administered 2017-03-29: 40 mg via INTRAVENOUS

## 2017-03-29 MED ORDER — SODIUM CHLORIDE 0.9 % IJ SOLN
INTRAMUSCULAR | Status: DC | PRN
  Administered 2017-03-29: 10 mL via INTRAVENOUS

## 2017-03-29 MED ORDER — GABAPENTIN 300 MG PO CAPS
300.0000 mg | ORAL_CAPSULE | Freq: Three times a day (TID) | ORAL | Status: DC
  Administered 2017-03-29 – 2017-03-31 (×7): 300 mg via ORAL
  Filled 2017-03-29 (×6): qty 1
  Filled 2017-03-29: qty 3

## 2017-03-29 MED ORDER — BUPIVACAINE HCL (PF) 0.25 % IJ SOLN
INTRAMUSCULAR | Status: AC
  Filled 2017-03-29: qty 30

## 2017-03-29 MED ORDER — LIDOCAINE-EPINEPHRINE 2 %-1:100000 IJ SOLN
INTRAMUSCULAR | Status: AC
  Filled 2017-03-29: qty 1

## 2017-03-29 MED ORDER — SODIUM CHLORIDE 0.9% FLUSH: 3.0000 mL | Freq: Two times a day (BID) | INTRAVENOUS | Status: DC

## 2017-03-29 MED ORDER — OXYCODONE HCL 5 MG PO TABS
ORAL_TABLET | ORAL | Status: AC
  Filled 2017-03-29: qty 1

## 2017-03-29 MED ORDER — LIDOCAINE 2% (20 MG/ML) 5 ML SYRINGE
INTRAMUSCULAR | Status: AC
  Filled 2017-03-29: qty 5

## 2017-03-29 MED ORDER — SUGAMMADEX SODIUM 200 MG/2ML IV SOLN
INTRAVENOUS | Status: AC
  Filled 2017-03-29: qty 2

## 2017-03-29 MED ORDER — CELECOXIB 200 MG PO CAPS
200.0000 mg | ORAL_CAPSULE | Freq: Two times a day (BID) | ORAL | Status: DC
  Administered 2017-03-29 – 2017-03-31 (×5): 200 mg via ORAL
  Filled 2017-03-29 (×5): qty 1

## 2017-03-29 MED ORDER — PROPOFOL 10 MG/ML IV BOLUS
INTRAVENOUS | Status: DC | PRN
  Administered 2017-03-29: 140 mg via INTRAVENOUS

## 2017-03-29 MED ORDER — HYDROMORPHONE HCL 1 MG/ML IJ SOLN: 1.0000 mg | INTRAMUSCULAR | Status: DC | PRN

## 2017-03-29 MED ORDER — CELECOXIB 200 MG PO CAPS
ORAL_CAPSULE | ORAL | Status: AC
  Filled 2017-03-29: qty 1

## 2017-03-29 MED ORDER — SODIUM CHLORIDE 0.9% FLUSH: 3.0000 mL | INTRAVENOUS | Status: DC | PRN

## 2017-03-29 MED ORDER — 0.9 % SODIUM CHLORIDE (POUR BTL) OPTIME
TOPICAL | Status: DC | PRN
  Administered 2017-03-29: 1000 mL

## 2017-03-29 MED ORDER — ACETAMINOPHEN 650 MG RE SUPP: 650.0000 mg | RECTAL | Status: DC | PRN

## 2017-03-29 MED ORDER — ONDANSETRON HCL 4 MG/2ML IJ SOLN
INTRAMUSCULAR | Status: AC
  Filled 2017-03-29: qty 2

## 2017-03-29 MED ORDER — SODIUM CHLORIDE 0.9 % IV SOLN: 250.0000 mL | INTRAVENOUS | Status: DC

## 2017-03-29 MED ORDER — ZOLPIDEM TARTRATE 5 MG PO TABS: 5.0000 mg | ORAL_TABLET | Freq: Every evening | ORAL | Status: DC | PRN

## 2017-03-29 MED ORDER — OXYCODONE HCL 5 MG PO TABS
5.0000 mg | ORAL_TABLET | ORAL | Status: DC | PRN
  Administered 2017-03-29 – 2017-03-31 (×8): 10 mg via ORAL
  Filled 2017-03-29 (×8): qty 2

## 2017-03-29 SURGICAL SUPPLY — 74 items
ADH SKN CLS APL DERMABOND .7 (GAUZE/BANDAGES/DRESSINGS) ×1
APL SKNCLS STERI-STRIP NONHPOA (GAUZE/BANDAGES/DRESSINGS)
BAG DECANTER FOR FLEXI CONT (MISCELLANEOUS) ×2 IMPLANT
BENZOIN TINCTURE PRP APPL 2/3 (GAUZE/BANDAGES/DRESSINGS) IMPLANT
BIT DRILL NEURO 2X3.1 SFT TUCH (MISCELLANEOUS) ×1 IMPLANT
BLADE CLIPPER SURG (BLADE) IMPLANT
BLADE SURG 11 STRL SS (BLADE) ×2 IMPLANT
BUR MATCHSTICK NEURO 3.0 LAGG (BURR) ×2 IMPLANT
BUR ROUND FLUTED 5 RND (BURR) ×2 IMPLANT
CANISTER SUCT 3000ML PPV (MISCELLANEOUS) ×2 IMPLANT
CARTRIDGE OIL MAESTRO DRILL (MISCELLANEOUS) ×1 IMPLANT
CHLORAPREP W/TINT 26ML (MISCELLANEOUS) ×2 IMPLANT
CONT SPEC 4OZ CLIKSEAL STRL BL (MISCELLANEOUS) ×2 IMPLANT
DECANTER SPIKE VIAL GLASS SM (MISCELLANEOUS) ×2 IMPLANT
DERMABOND ADVANCED (GAUZE/BANDAGES/DRESSINGS) ×1
DERMABOND ADVANCED .7 DNX12 (GAUZE/BANDAGES/DRESSINGS) ×1 IMPLANT
DIFFUSER DRILL AIR PNEUMATIC (MISCELLANEOUS) ×2 IMPLANT
DRAPE HALF SHEET 40X57 (DRAPES) ×2 IMPLANT
DRAPE MICROSCOPE LEICA (MISCELLANEOUS) ×2 IMPLANT
DRAPE POUCH INSTRU U-SHP 10X18 (DRAPES) ×2 IMPLANT
DRAPE SURG 17X23 STRL (DRAPES) ×2 IMPLANT
DRILL NEURO 2X3.1 SOFT TOUCH (MISCELLANEOUS) ×2
DRSG OPSITE POSTOP 4X6 (GAUZE/BANDAGES/DRESSINGS) ×2 IMPLANT
ELECT REM PT RETURN 9FT ADLT (ELECTROSURGICAL) ×2
ELECTRODE REM PT RTRN 9FT ADLT (ELECTROSURGICAL) ×1 IMPLANT
GAUZE SPONGE 4X4 12PLY STRL (GAUZE/BANDAGES/DRESSINGS) IMPLANT
GAUZE SPONGE 4X4 16PLY XRAY LF (GAUZE/BANDAGES/DRESSINGS) IMPLANT
GLOVE BIO SURGEON STRL SZ7 (GLOVE) ×2 IMPLANT
GLOVE BIOGEL PI IND STRL 6.5 (GLOVE) ×1 IMPLANT
GLOVE BIOGEL PI IND STRL 7.0 (GLOVE) ×2 IMPLANT
GLOVE BIOGEL PI IND STRL 7.5 (GLOVE) ×1 IMPLANT
GLOVE BIOGEL PI IND STRL 8.5 (GLOVE) ×1 IMPLANT
GLOVE BIOGEL PI INDICATOR 6.5 (GLOVE) ×1
GLOVE BIOGEL PI INDICATOR 7.0 (GLOVE) ×2
GLOVE BIOGEL PI INDICATOR 7.5 (GLOVE) ×1
GLOVE BIOGEL PI INDICATOR 8.5 (GLOVE) ×1
GLOVE ECLIPSE 8.5 STRL (GLOVE) ×2 IMPLANT
GLOVE SS BIOGEL STRL SZ 7.5 (GLOVE) ×2 IMPLANT
GLOVE SUPERSENSE BIOGEL SZ 7.5 (GLOVE) ×2
GLOVE SURG SS PI 6.5 STRL IVOR (GLOVE) ×2 IMPLANT
GOWN STRL REUS W/ TWL LRG LVL3 (GOWN DISPOSABLE) ×4 IMPLANT
GOWN STRL REUS W/ TWL XL LVL3 (GOWN DISPOSABLE) IMPLANT
GOWN STRL REUS W/TWL 2XL LVL3 (GOWN DISPOSABLE) ×2 IMPLANT
GOWN STRL REUS W/TWL LRG LVL3 (GOWN DISPOSABLE) ×8
GOWN STRL REUS W/TWL XL LVL3 (GOWN DISPOSABLE)
HEMOSTAT POWDER KIT SURGIFOAM (HEMOSTASIS) ×4 IMPLANT
KIT BASIN OR (CUSTOM PROCEDURE TRAY) ×2 IMPLANT
KIT ROOM TURNOVER OR (KITS) ×2 IMPLANT
NEEDLE HYPO 18GX1.5 BLUNT FILL (NEEDLE) ×2 IMPLANT
NEEDLE HYPO 21X1.5 SAFETY (NEEDLE) ×4 IMPLANT
NEEDLE HYPO 25X1 1.5 SAFETY (NEEDLE) IMPLANT
NS IRRIG 1000ML POUR BTL (IV SOLUTION) ×2 IMPLANT
OIL CARTRIDGE MAESTRO DRILL (MISCELLANEOUS) ×2
PACK LAMINECTOMY NEURO (CUSTOM PROCEDURE TRAY) ×2 IMPLANT
PACK UNIVERSAL I (CUSTOM PROCEDURE TRAY) ×2 IMPLANT
PAD ARMBOARD 7.5X6 YLW CONV (MISCELLANEOUS) ×10 IMPLANT
PATTIES SURGICAL .5X1.5 (GAUZE/BANDAGES/DRESSINGS) IMPLANT
RUBBERBAND STERILE (MISCELLANEOUS) ×4 IMPLANT
SPONGE SURGIFOAM ABS GEL SZ50 (HEMOSTASIS) IMPLANT
STRIP CLOSURE SKIN 1/2X4 (GAUZE/BANDAGES/DRESSINGS) IMPLANT
SUT STRATAFIX MNCRL+ 3-0 PS-2 (SUTURE) ×1
SUT STRATAFIX MONOCRYL 3-0 (SUTURE) ×1
SUT VIC AB 0 CT1 18XCR BRD8 (SUTURE) ×2 IMPLANT
SUT VIC AB 0 CT1 8-18 (SUTURE) ×4
SUT VIC AB 2-0 CT1 18 (SUTURE) ×4 IMPLANT
SUT VIC AB 4-0 PS2 27 (SUTURE) IMPLANT
SUTURE STRATFX MNCRL+ 3-0 PS-2 (SUTURE) ×1 IMPLANT
SYR 30ML LL (SYRINGE) ×6 IMPLANT
SYR 3ML LL SCALE MARK (SYRINGE) ×2 IMPLANT
SYR 5ML LL (SYRINGE) IMPLANT
TOWEL GREEN STERILE (TOWEL DISPOSABLE) ×2 IMPLANT
TOWEL GREEN STERILE FF (TOWEL DISPOSABLE) ×2 IMPLANT
TUBE CONNECTING 12X1/4 (SUCTIONS) IMPLANT
WATER STERILE IRR 1000ML POUR (IV SOLUTION) ×2 IMPLANT

## 2017-03-29 NOTE — Transfer of Care (Signed)
Immediate Anesthesia Transfer of Care Note  Patient: Lauren Winters  Procedure(s) Performed: Procedure(s): Re operative Right Lumbar Three-Four Laminectomy and discectomy with Left Lumbar Three-Four, Bilateral Lumbar Four-Five Laminectomy for decompression (N/A)  Patient Location: PACU  Anesthesia Type:General  Level of Consciousness: awake, alert  and oriented  Airway & Oxygen Therapy: Patient Spontanous Breathing and Patient connected to nasal cannula oxygen  Post-op Assessment: Report given to RN, Post -op Vital signs reviewed and stable and Patient moving all extremities X 4  Post vital signs: Reviewed and stable  Last Vitals:  Vitals:   03/29/17 0554  BP: (!) 159/72  Pulse: 65  Resp: 20  Temp: 36.8 C    Last Pain:  Vitals:   03/29/17 0554  TempSrc: Oral         Complications: No apparent anesthesia complications

## 2017-03-29 NOTE — Anesthesia Procedure Notes (Signed)
Procedure Name: Intubation Date/Time: 03/29/2017 7:30 AM Performed by: Merrilyn Puma B Pre-anesthesia Checklist: Patient identified, Emergency Drugs available, Suction available, Patient being monitored and Timeout performed Patient Re-evaluated:Patient Re-evaluated prior to inductionOxygen Delivery Method: Circle system utilized Preoxygenation: Pre-oxygenation with 100% oxygen Intubation Type: IV induction Ventilation: Mask ventilation without difficulty Laryngoscope Size: Mac and 3 Grade View: Grade I Tube type: Oral Tube size: 7.0 mm Number of attempts: 1 Airway Equipment and Method: Stylet Placement Confirmation: ETT inserted through vocal cords under direct vision,  positive ETCO2,  CO2 detector and breath sounds checked- equal and bilateral Secured at: 21 cm Tube secured with: Tape Dental Injury: Teeth and Oropharynx as per pre-operative assessment

## 2017-03-29 NOTE — Progress Notes (Signed)
Inpatient Diabetes Program Recommendations  AACE/ADA: New Consensus Statement on Inpatient Glycemic Control (2015)  Target Ranges:  Prepandial:   less than 140 mg/dL      Peak postprandial:   less than 180 mg/dL (1-2 hours)      Critically ill patients:  140 - 180 mg/dL   Lab Results  Component Value Date   GLUCAP 209 (H) 03/29/2017   HGBA1C 7.4 (H) 03/21/2017   Review of Glycemic Control  Diabetes history: DM 2 Outpatient Diabetes medications: Metformin 1000 mg BID Current orders for Inpatient glycemic control: None  Inpatient Diabetes Program Recommendations:   Glucose in the 200 range. Please consider ordering Novolog Moderate Correction 0-15 units tid + Novolog HS scale while inpatient.  Thanks,  Tama Headings RN, MSN, [facility] Inpatient Diabetes Coordinator Team Pager [phone] (8a-5p)

## 2017-03-29 NOTE — Anesthesia Postprocedure Evaluation (Addendum)
Anesthesia Post Note  Patient: Lauren Winters  Procedure(s) Performed: Procedure(s) (LRB): Re operative Right Lumbar Three-Four Laminectomy and discectomy with Left Lumbar Three-Four, Bilateral Lumbar Four-Five Laminectomy for decompression (N/A)  Patient location during evaluation: PACU Anesthesia Type: General Level of consciousness: awake, awake and alert and oriented Pain management: pain level controlled Vital Signs Assessment: post-procedure vital signs reviewed and stable Respiratory status: spontaneous breathing, nonlabored ventilation and respiratory function stable Cardiovascular status: blood pressure returned to baseline Postop Assessment: no headache Anesthetic complications: no       Last Vitals:  Vitals:   03/29/17 0949 03/29/17 1004  BP: (!) 155/56 (!) 157/64  Pulse: 78 65  Resp: 15 12  Temp:      Last Pain:  Vitals:   03/29/17 0554  TempSrc: Oral                 Erminia Mcnew COKER

## 2017-03-29 NOTE — Evaluation (Signed)
Physical Therapy Evaluation Patient Details Name: Lauren Winters MRN: [id number] DOB: [birth year] Today's Date: 03/29/2017   History of Present Illness  51 y.o. female s/p right L3-L4 laminectomy on 03/29/17. PMH significant for anxiety, depression, HTN, fibromyalgia, lupus, Raynaud's, and tobacco abuse.   Clinical Impression  Pt presents following elective lumbar laminectomy. Pt tolerated OOB mobility and ambulation well for first time. Pt educated on back precautions and verbalized understanding. Recommend d/c home with HHPT for safe transition home. Acute PT will follow.     Follow Up Recommendations Home health PT;Supervision/Assistance - 24 hour    Equipment Recommendations  None recommended by PT    Recommendations for Other Services       Precautions / Restrictions Precautions Precautions: Back;Fall Precaution Booklet Issued: Yes (comment) Precaution Comments: reviewed precautions with pt who verbalized understanding Restrictions Weight Bearing Restrictions: No      Mobility  Bed Mobility Overal bed mobility: Needs Assistance Bed Mobility: Sit to Supine       Sit to supine: Mod assist   General bed mobility comments: physical assist to get LE up to bed. verbal cues for technique and safety with precautions. increased time and effort  Transfers Overall transfer level: Needs assistance Equipment used: Rolling walker (2 wheeled) Transfers: Sit to/from Stand Sit to Stand: Min guard         General transfer comment: verbal cues for sidelying to log roll technique. increased time and effort. min guard for safety  Ambulation/Gait Ambulation/Gait assistance: Min guard Ambulation Distance (Feet): 80 Feet Assistive device: Rolling walker (2 wheeled) Gait Pattern/deviations: Step-through pattern;Decreased stride length;Trunk flexed Gait velocity: decreased Gait velocity interpretation: Below normal speed for age/gender General Gait Details: slow speed with 2  standing rest breaks for <10 seconds. verbal cues for use of abdominal muscles and upright posture  Stairs            Wheelchair Mobility    Modified Rankin (Stroke Patients Only)       Balance Overall balance assessment: Needs assistance Sitting-balance support: Feet supported;No upper extremity supported Sitting balance-Leahy Scale: Good     Standing balance support: Bilateral upper extremity supported Standing balance-Leahy Scale: Poor Standing balance comment: reliant on RW                             Pertinent Vitals/Pain Pain Assessment: 0-10 Pain Score: 6  Pain Location: surgical site Pain Descriptors / Indicators: Discomfort;Aching;Sore Pain Intervention(s): Limited activity within patient's tolerance;Monitored during session    [place] expects to be discharged to:: Private residence Living Arrangements: Spouse/significant other;Children Available Help at Discharge: Family;Available 24 hours/day Type of Home: House Home Access: Stairs to enter Entrance Stairs-Rails: None Entrance Stairs-Number of Steps: 2 Home Layout: One level Home Equipment: Walker - 2 wheels;Cane - single point Additional Comments: between husband and children, someone can be home to provide 24 hour assist upon d/c    Prior Function Level of Independence: Independent with assistive device(s)         Comments: RW and cane, primarily used cane     Hand Dominance   Dominant Hand: Right    Extremity/Trunk Assessment   Upper Extremity Assessment Upper Extremity Assessment: Overall WFL for tasks assessed    Lower Extremity Assessment Lower Extremity Assessment: Generalized weakness    Cervical / Trunk Assessment Cervical / Trunk Assessment: Other exceptions Cervical / Trunk Exceptions: spinal surgery  Communication   Communication: No difficulties  Cognition Arousal/Alertness:  Awake/alert Behavior During Therapy: WFL for tasks  assessed/performed Overall Cognitive Status: Within Functional Limits for tasks assessed                                        General Comments      Exercises     Assessment/Plan    PT Assessment Patient needs continued PT services  PT Problem List Decreased strength;Decreased activity tolerance;Decreased balance;Decreased mobility;Pain       PT Treatment Interventions DME instruction;Gait training;Stair training;Functional mobility training;Therapeutic activities;Balance training;Therapeutic exercise;Neuromuscular re-education;Patient/family education    PT Goals (Current goals can be found in the Care Plan section)  Acute Rehab PT Goals Patient Stated Goal: go home PT Goal Formulation: With patient Time For Goal Achievement: 04/12/17 Potential to Achieve Goals: Good    Frequency Min 5X/week   Barriers to discharge        Co-evaluation               End of Session   Activity Tolerance: Patient tolerated treatment well Patient left: in bed;with call bell/phone within reach;with family/visitor present Nurse Communication: Mobility status PT Visit Diagnosis: Unsteadiness on feet (R26.81);Difficulty in walking, not elsewhere classified (R26.2);Pain Pain - part of body:  (back)    Time: 1140-1159 PT Time Calculation (min) (ACUTE ONLY): 19 min   Charges:   PT Evaluation $PT Eval Moderate Complexity: 1 Procedure     PT G CodesTracie Harrier, SPT Acute Rehab SPT [phone]    Tracie Harrier 03/29/2017, 12:27 PM

## 2017-03-29 NOTE — H&P (Signed)
CC:  No chief complaint on file. Back and bilateral leg pain  HPI: Lauren Winters is a 51 y.o. female with back and bilateral leg pain.  She has lumbosacral spondylosis with stenosis and presents for elective lumbar laminectomy for decompression.  She has had a prior right L3-4 laminectomy with discectomy.  Today she reports that both legs are equally painful.  PMH: Past Medical History:  Diagnosis Date  . Anxiety   . Atypical chest pain    a. ETT 05/2014: normal stress test without evidence of myocardial ischemia or chest pain at peak stress  . Depression   . Diabetes mellitus without complication ([place]) metformin   dx 2015  . Fibromyalgia   . Heart murmur    told back in 2007, but no mention with 2016 cardiac w/u  . Hypertension   . Lupus (systemic lupus erythematosus) ([place])   . Mitral regurgitation    a. 05/2014 echo: EF >55%, nl LV/RV sys fxn, mild MR/AR, no valvular stenosis   . Obesity (BMI 30-39.9)   . Raynaud's phenomenon   . Rheumatoid arthritis ([place])   . Sjogren's disease ([place])   . Tobacco abuse    a. ongoing; b. ongoing SOB    PSH: Past Surgical History:  Procedure Laterality Date  . ABDOMINAL HYSTERECTOMY    . CESAREAN SECTION     x3  . LUMBAR LAMINECTOMY    . TUBAL LIGATION      SH: Social History  Substance Use Topics  . Smoking status: Former Smoker    Packs/day: 0.50    Years: 10.00    Types: Cigarettes    Quit date: 07/22/2015  . Smokeless tobacco: Never Used  . Alcohol use No    MEDS: Prior to Admission medications   Medication Sig Start Date End Date Taking? Authorizing Provider  amLODipine (NORVASC) 5 MG tablet Take 1 tablet (5 mg total) by mouth 2 (two) times daily. Patient taking differently: Take 10 mg by mouth daily.  12/25/15  Yes Pleas Koch, NP  buPROPion (WELLBUTRIN XL) 150 MG 24 hr tablet Take 1 tablet (150 mg total) by mouth daily. 09/28/16  Yes Pleas Koch, NP  Clobetasol Prop Crea-Coal Tar (CLOBETA CREAM EX) Apply 1  application topically daily as needed (skin irritation).    Yes Historical Provider, MD  cloNIDine (CATAPRES) 0.1 MG tablet Take 1 tablet (0.1 mg total) by mouth daily. 07/28/16  Yes Pleas Koch, NP  diclofenac sodium (VOLTAREN) 1 % GEL Apply 1 application topically daily as needed.   Yes Historical Provider, MD  escitalopram (LEXAPRO) 20 MG tablet Take 1 tablet (20 mg total) by mouth daily. 05/05/16  Yes Pleas Koch, NP  folic acid (FOLVITE) 1 MG tablet Take 2 tablets (2 mg total) by mouth daily. 12/23/16 03/18/18 Yes Naitik Panwala, PA-C  gabapentin (NEURONTIN) 300 MG capsule Take 300 mg by mouth 2 (two) times daily.  11/02/16  Yes Historical Provider, MD  Halcinonide 0.1 % OINT Apply 1 application topically 2 (two) times daily as needed.   Yes Historical Provider, MD  hydroxychloroquine (PLAQUENIL) 200 MG tablet Take 1 tablet (200 mg total) by mouth 2 (two) times daily. 12/23/16  Yes Naitik Panwala, PA-C  hydrOXYzine (ATARAX/VISTARIL) 25 MG tablet Take 1 tablet (25 mg total) by mouth 2 (two) times daily as needed for anxiety. Patient taking differently: Take 25 mg by mouth daily.  07/28/16  Yes Pleas Koch, NP  metFORMIN (GLUCOPHAGE) 1000 MG tablet Take 1 tablet (1,000  mg total) by mouth 2 (two) times daily with a meal. 03/17/16  Yes Pleas Koch, NP  methocarbamol (ROBAXIN) 500 MG tablet Take 1 tablet (500 mg total) by mouth every 8 (eight) hours as needed for muscle spasms. 02/18/17  Yes Pleas Koch, NP  methotrexate (RHEUMATREX) 2.5 MG tablet Take 2.5 mg by mouth once a week. Caution:Chemotherapy. Protect from light.   Yes Historical Provider, MD  Methotrexate, PF, 20 MG/0.4ML SOAJ Inject 0.8 mLs into the skin once a week. Every Tuesday   Yes Historical Provider, MD  metoprolol (LOPRESSOR) 100 MG tablet Take 1 tablet (100 mg total) by mouth 2 (two) times daily. Patient taking differently: Take 100 mg by mouth daily.  12/17/15  Yes Pleas Koch, NP  pilocarpine (SALAGEN)  5 MG tablet Take 5 mg by mouth daily as needed.    Yes Historical Provider, MD  tacrolimus (PROTOPIC) 0.1 % ointment Apply 1 application topically 2 (two) times daily as needed.   Yes Historical Provider, MD  Triamcinolone Acetonide (TRIAMCINOLONE 0.1 % CREAM : EUCERIN) CREA Apply 1 application topically 2 (two) times daily as needed for rash or irritation.    Yes Historical Provider, MD  valsartan-hydrochlorothiazide (DIOVAN-HCT) 320-25 MG tablet Take 1 tablet by mouth daily. 10/05/16  Yes Pleas Koch, NP  Vitamin D, Ergocalciferol, (DRISDOL) 50000 units CAPS capsule Take 1 capsule (50,000 Units total) by mouth every 7 (seven) days. 12/29/16  Yes Naitik Panwala, PA-C    ALLERGY: Allergies  Allergen Reactions  . Clindamycin/Lincomycin Hives  . Adhesive [Tape] Other (See Comments)    Sensitivity to certain band aids and adhesives - burns skin, makes skin raw  . Sudafed [Pseudoephedrine Hcl] Other (See Comments)    Dizziness     ROS: ROS  NEUROLOGIC EXAM: Awake, alert, oriented Memory and concentration grossly intact Speech fluent, appropriate CN grossly intact Motor exam: Upper Extremities Deltoid Bicep Tricep Grip  Right 5/5 5/5 5/5 5/5  Left 5/5 5/5 5/5 5/5   Lower Extremity IP Quad PF DF EHL  Right 5/5 5/5 5/5 5/5 5/5  Left 5/5 5/5 5/5 5/5 5/5   Sensation grossly intact to LT  IMAGING: No new imaging  IMPRESSION: - 51 y.o. female with lumbosacral spondylosis with radiculopathy.  PLAN: - L3-4, L4-5 laminectomy for decompression, right L3-4 discectomy. - We have discussed the risks, benefits, and alternatives to surgery and she wishes to proceed.

## 2017-03-29 NOTE — Brief Op Note (Signed)
03/29/2017  9:14 AM  PATIENT:  Jacklyn Shell  51 y.o. female  PRE-OPERATIVE DIAGNOSIS:  Herniated nucleus pulposis of lumbosacral region  POST-OPERATIVE DIAGNOSIS:  Herniated Nucleous Pulposus of Lumbosacral region  PROCEDURE:  Procedure(s): Re operative Right Lumbar Three-Four Laminectomy and discectomy with Left Lumbar Three-Four, Bilateral Lumbar Four-Five Laminectomy for decompression (N/A)  SURGEON:  Surgeon(s) and Role:    * Tamala Fothergill, MD - Primary    * Kristeen Miss, MD - Assisting  PHYSICIAN ASSISTANT: Ferne Reus, PA-C  ANESTHESIA:   general  EBL:  Total I/O In: 1200 [I.V.:1200] Out: 200 [Blood:200]  BLOOD ADMINISTERED:none  DRAINS: none   LOCAL MEDICATIONS USED:  MARCAINE     SPECIMEN:  No Specimen  DISPOSITION OF SPECIMEN:  N/A  COUNTS:  YES  TOURNIQUET:  * No tourniquets in log *  DICTATION: .Dragon Dictation  PLAN OF CARE: Admit for overnight observation  PATIENT DISPOSITION:  PACU - hemodynamically stable.   Delay start of Pharmacological VTE agent (>24hrs) due to surgical blood loss or risk of bleeding: yes

## 2017-03-29 NOTE — Anesthesia Preprocedure Evaluation (Addendum)
Anesthesia Evaluation  Patient identified by MRN, date of birth, ID band Patient awake    Reviewed: Allergy & Precautions, NPO status , Patient's Chart, lab work & pertinent test results  Airway Mallampati: II  TM Distance: >3 FB Neck ROM: Full    Dental  (+) Teeth Intact, Dental Advisory Given   Pulmonary former smoker,    breath sounds clear to auscultation       Cardiovascular hypertension,  Rhythm:Regular Rate:Normal     Neuro/Psych    GI/Hepatic   Endo/Other  diabetes  Renal/GU      Musculoskeletal   Abdominal   Peds  Hematology   Anesthesia Other Findings   Reproductive/Obstetrics                             Anesthesia Physical Anesthesia Plan  ASA: III  Anesthesia Plan: General   Post-op Pain Management:    Induction: Intravenous  Airway Management Planned: Oral ETT  Additional Equipment:   Intra-op Plan:   Post-operative Plan: Extubation in OR  Informed Consent: I have reviewed the patients History and Physical, chart, labs and discussed the procedure including the risks, benefits and alternatives for the proposed anesthesia with the patient or authorized representative who has indicated his/her understanding and acceptance.   Dental advisory given  Plan Discussed with: CRNA and Anesthesiologist  Anesthesia Plan Comments:         Anesthesia Quick Evaluation  

## 2017-03-30 ENCOUNTER — Encounter (HOSPITAL_COMMUNITY): Payer: Self-pay | Admitting: Neurological Surgery

## 2017-03-30 ENCOUNTER — Other Ambulatory Visit: Payer: Self-pay | Admitting: Primary Care

## 2017-03-30 DIAGNOSIS — E559 Vitamin D deficiency, unspecified: Secondary | ICD-10-CM

## 2017-03-30 DIAGNOSIS — E785 Hyperlipidemia, unspecified: Secondary | ICD-10-CM

## 2017-03-30 LAB — CBC
HCT: 37.7 % (ref 36.0–46.0)
Hemoglobin: 12.6 g/dL (ref 12.0–15.0)
MCH: 27.9 pg (ref 26.0–34.0)
MCHC: 33.4 g/dL (ref 30.0–36.0)
MCV: 83.4 fL (ref 78.0–100.0)
Platelets: 289 10*3/uL (ref 150–400)
RBC: 4.52 MIL/uL (ref 3.87–5.11)
RDW: 12.9 % (ref 11.5–15.5)
WBC: 12 10*3/uL — ABNORMAL HIGH (ref 4.0–10.5)

## 2017-03-30 LAB — GLUCOSE, CAPILLARY
Glucose-Capillary: 178 mg/dL — ABNORMAL HIGH (ref 65–99)
Glucose-Capillary: 233 mg/dL — ABNORMAL HIGH (ref 65–99)
Glucose-Capillary: 163 mg/dL — ABNORMAL HIGH (ref 65–99)
Glucose-Capillary: 199 mg/dL — ABNORMAL HIGH (ref 65–99)

## 2017-03-30 LAB — BASIC METABOLIC PANEL
Anion gap: 7 (ref 5–15)
BUN: 12 mg/dL (ref 6–20)
CO2: 30 mmol/L (ref 22–32)
Calcium: 9.1 mg/dL (ref 8.9–10.3)
Chloride: 100 mmol/L — ABNORMAL LOW (ref 101–111)
Creatinine, Ser: 0.87 mg/dL (ref 0.44–1.00)
GFR calc Af Amer: >60 mL/min (ref >60)
GFR calc non Af Amer: >60 mL/min (ref >60)
Glucose, Bld: 197 mg/dL — ABNORMAL HIGH (ref 65–99)
Potassium: 4.5 mmol/L (ref 3.5–5.1)
Sodium: 137 mmol/L (ref 135–145)

## 2017-03-30 LAB — PROTIME-INR
INR: 1.05
Prothrombin Time: 13.7 seconds (ref 11.4–15.2)

## 2017-03-30 LAB — APTT: aPTT: 29 s (ref 24–36)

## 2017-03-30 MED ORDER — ACETAMINOPHEN 500 MG PO TABS
1000.0000 mg | ORAL_TABLET | Freq: Four times a day (QID) | ORAL | Status: DC | PRN
  Administered 2017-03-30: 1000 mg via ORAL
  Filled 2017-03-30: qty 2

## 2017-03-30 MED ORDER — CYCLOBENZAPRINE HCL 10 MG PO TABS: 10.0000 mg | ORAL_TABLET | Freq: Three times a day (TID) | ORAL | 2 refills | Status: DC | PRN

## 2017-03-30 MED ORDER — PANTOPRAZOLE SODIUM 40 MG PO TBEC
40.0000 mg | DELAYED_RELEASE_TABLET | Freq: Every day | ORAL | Status: DC
  Administered 2017-03-30: 40 mg via ORAL
  Filled 2017-03-30: qty 1

## 2017-03-30 MED ORDER — HYDROCODONE-ACETAMINOPHEN 7.5-325 MG PO TABS: 1.0000 | ORAL_TABLET | ORAL | 0 refills | Status: DC | PRN

## 2017-03-30 MED FILL — Thrombin For Soln 5000 Unit: CUTANEOUS | Qty: 5000 | Status: AC

## 2017-03-30 NOTE — Progress Notes (Signed)
Headache; not like her prior surgery during which she had a CSF leak Awake and alert Full strength Tremulous Will stay one more night

## 2017-03-30 NOTE — Evaluation (Addendum)
Occupational Therapy Evaluation Patient Details Name: Lauren Winters MRN: [id number] DOB: 24-Jul-1966 Today's Date: 03/30/2017    History of Present Illness 51 y.o. female s/p right L3-L4 laminectomy on 03/29/17. PMH significant for anxiety, depression, HTN, fibromyalgia, lupus, Raynaud's, and tobacco abuse.    Clinical Impression   Patient is s/p L3-4 laminectomy surgery resulting in functional limitations due to the deficits listed below (see OT problem list). Pt working with therapy and immediately reports HA upon standing. Pt moaned and required cueing from therapist to report pain level and location. Pt return to supine due to sudden onset of HA. RN notified. Pt asking when therapist would return to help with progression toward goals.  Patient will benefit from skilled OT acutely to increase independence and safety with ADLS to allow discharge home without follow up .   MD please not HA starts with positional change.      Follow Up Recommendations  No OT follow up    Equipment Recommendations  None recommended by OT    Recommendations for Other Services       Precautions / Restrictions Precautions Precautions: Back;Fall Precaution Booklet Issued: Yes (comment) Precaution Comments: back handout provided and reviewed in detail for adls. Restrictions Weight Bearing Restrictions: No      Mobility Bed Mobility Overal bed mobility: Needs Assistance Bed Mobility: Supine to Sit;Sit to Supine Rolling: Supervision Sidelying to sit: Min assist Supine to sit: Min assist Sit to supine: Min assist Sit to sidelying: Min guard General bed mobility comments: cues for sequence and safety with back precautions  Transfers Overall transfer level: Needs assistance Equipment used: Rolling walker (2 wheeled) Transfers: Sit to/from Stand Sit to Stand: Min guard         General transfer comment: cues for hand placment    Balance Overall balance assessment: Needs  assistance Sitting-balance support: Feet supported;No upper extremity supported Sitting balance-Leahy Scale: Good     Standing balance support: Bilateral upper extremity supported Standing balance-Leahy Scale: Poor Standing balance comment: reliant on RW                           ADL either performed or assessed with clinical judgement   ADL Overall ADL's : Needs assistance/impaired Eating/Feeding: Independent   Grooming: Wash/dry hands               Lower Body Dressing: Supervision/safety;Adhering to back precautions;Sit to/from stand Lower Body Dressing Details (indicate cue type and reason): educated and don underwear during session Toilet Transfer: Supervision/safety         Tub/Shower Transfer Details (indicate cue type and reason): unable to demo tub transfer due to HA severe and sudden onset Functional mobility during ADLs: Supervision/safety   Back handout provided and reviewed adls in detail. Pt educated on: clothing between brace, never sleep in brace, set an alarm at night for medication, avoid sitting for long periods of time, correct bed positioning for sleeping, correct sequence for bed mobility, avoiding lifting more than 5 pounds and never wash directly over incision..       Vision Baseline Vision/History: Wears glasses Wears Glasses: At all times       Perception     Praxis      Pertinent Vitals/Pain Pain Assessment: 0-10 Pain Score: 7  Pain Location: head Pain Descriptors / Indicators: Headache Pain Intervention(s): Monitored during session;Premedicated before session;Repositioned;Other (comment) (headache starts with movement and increases with upright)     Hand Dominance Right  Extremity/Trunk Assessment Upper Extremity Assessment Upper Extremity Assessment: Overall WFL for tasks assessed   Lower Extremity Assessment Lower Extremity Assessment: Defer to PT evaluation   Cervical / Trunk Assessment Cervical / Trunk  Assessment: Other exceptions (s/p surg)   Communication Communication Communication: No difficulties   Cognition Arousal/Alertness: Awake/alert Behavior During Therapy: WFL for tasks assessed/performed Overall Cognitive Status: Within Functional Limits for tasks assessed                                     General Comments  dressing dry and intact    Exercises     Shoulder Instructions      Home Living Family/patient expects to be discharged to:: Private residence Living Arrangements: Spouse/significant other Available Help at Discharge: Family;Available 24 hours/day Type of Home: House Home Access: Stairs to enter [employer] of Steps: 2 Entrance Stairs-Rails: None Home Layout: One level     Bathroom Shower/Tub: Teacher, early years/pre: Standard     Home Equipment: Environmental consultant - 2 wheels;Cane - single point   Additional Comments: between husband and children, someone can be home to provide 24 hour assist upon d/c      Prior Functioning/Environment Level of Independence: Independent with assistive device(s)        Comments: RW and cane, primarily used cane        OT Problem List: Decreased strength;Decreased activity tolerance;Impaired balance (sitting and/or standing);Decreased safety awareness;Decreased knowledge of use of DME or AE;Decreased knowledge of precautions;Cardiopulmonary status limiting activity;Pain      OT Treatment/Interventions: Self-care/ADL training;DME and/or AE instruction;Therapeutic activities;Patient/family education;Balance training    OT Goals(Current goals can be found in the care plan section) Acute Rehab OT Goals Patient Stated Goal: go home OT Goal Formulation: With patient Time For Goal Achievement: 04/13/17 Potential to Achieve Goals: Good  OT Frequency: Min 2X/week   Barriers to D/C:            Co-evaluation              End of Session Equipment Utilized During Treatment: Back  brace Nurse Communication: Mobility status;Precautions  Activity Tolerance: Patient tolerated treatment well Patient left: in bed;with call bell/phone within reach;with family/visitor present  OT Visit Diagnosis: Unsteadiness on feet (R26.81)                Time: 0037-0488 OT Time Calculation (min): 17 min Charges:  OT General Charges $OT Visit: 1 Procedure OT Evaluation $OT Eval Moderate Complexity: 1 Procedure G-Codes:      Jeri Modena   OTR/L Pager: [phone] Office: [phone] .   Parke Poisson B 03/30/2017, 10:59 AM

## 2017-03-30 NOTE — Progress Notes (Signed)
qPhysical Therapy Treatment Patient Details Name: Lauren Winters MRN: [id number] DOB: Mar 23, 1966 Today's Date: 03/30/2017    History of Present Illness 51 y.o. female s/p right L3-L4 laminectomy on 03/29/17. PMH significant for anxiety, depression, HTN, fibromyalgia, lupus, Raynaud's, and tobacco abuse.     PT Comments    Pt progressing towards physical therapy goals. Was able to perform transfers and ambulation with improved independence this session. Pt did complain of sudden, intense headache when transferred to standing and began gait training. 8/10 pain reported. Pt appears motivated to participate with therapy but session was ended due to sudden HA. Pt reports she had a CSF leak after her last surgery. RN notified. Will continue to follow and progress as able per POC.    Follow Up Recommendations  Home health PT;Supervision/Assistance - 24 hour     Equipment Recommendations  None recommended by PT    Recommendations for Other Services       Precautions / Restrictions Precautions Precautions: Back;Fall Precaution Booklet Issued: Yes (comment) Precaution Comments: Pt was cued for precautions during functional mobility.  Restrictions Weight Bearing Restrictions: No    Mobility  Bed Mobility Overal bed mobility: Needs Assistance Bed Mobility: Rolling;Sidelying to Sit;Sit to Sidelying Rolling: Supervision Sidelying to sit: Supervision     Sit to sidelying: Min guard General bed mobility comments: Pt was able to transition to EOB without assistance. VC's for proper logroll technique required. To return to S/L, min guard was provided at LE's to elevate back up to bed.   Transfers Overall transfer level: Needs assistance Equipment used: Rolling walker (2 wheeled) Transfers: Sit to/from Stand Sit to Stand: Min guard         General transfer comment: Pt was cued for hand placement on seated surface for safety. Wanting to pull up to stand from the walker.    Ambulation/Gait Ambulation/Gait assistance: Min guard Ambulation Distance (Feet): 40 Feet Assistive device: Rolling walker (2 wheeled) Gait Pattern/deviations: Step-through pattern;Decreased stride length;Trunk flexed Gait velocity: Decreased Gait velocity interpretation: Below normal speed for age/gender General Gait Details: VC's for improved posture. Pt complained of increased HA with ambulation which progressed to 8/10 pain. Pt took several short standing rest breaks with eyes closed due to HA pain before she reached the bed.    Stairs            Wheelchair Mobility    Modified Rankin (Stroke Patients Only)       Balance Overall balance assessment: Needs assistance Sitting-balance support: Feet supported;No upper extremity supported Sitting balance-Leahy Scale: Good     Standing balance support: Bilateral upper extremity supported Standing balance-Leahy Scale: Poor Standing balance comment: reliant on RW                            Cognition Arousal/Alertness: Awake/alert Behavior During Therapy: WFL for tasks assessed/performed Overall Cognitive Status: Within Functional Limits for tasks assessed                                        Exercises      General Comments        Pertinent Vitals/Pain Pain Assessment: 0-10 Pain Score: 8  Pain Location: Headache Pain Intervention(s): Repositioned (RN notified)    Home Living  Prior Function            PT Goals (current goals can now be found in the care plan section) Acute Rehab PT Goals Patient Stated Goal: go home PT Goal Formulation: With patient Time For Goal Achievement: 04/12/17 Potential to Achieve Goals: Good Progress towards PT goals: Progressing toward goals    Frequency    Min 5X/week      PT Plan Current plan remains appropriate    Co-evaluation             End of Session   Activity Tolerance: Patient limited by  pain (Headache) Patient left: in bed;with call bell/phone within reach;with family/visitor present Nurse Communication: Mobility status;Other (comment) (Headache) PT Visit Diagnosis: Unsteadiness on feet (R26.81);Difficulty in walking, not elsewhere classified (R26.2);Pain Pain - part of body:  (Head)     Time: 1423-9532 PT Time Calculation (min) (ACUTE ONLY): 14 min  Charges:  $Gait Training: 8-22 mins                    G Codes:       Rolinda Roan, PT, DPT Acute Rehabilitation Services Pager: [phone]    Thelma Comp 03/30/2017, 10:25 AM

## 2017-03-30 NOTE — Discharge Summary (Addendum)
Physician Discharge Summary  Patient ID: Lauren Winters MRN: [id number] DOB/AGE: 1966/08/02 51 y.o.  Admit date: 03/29/2017 Discharge date: 03/30/2017   PRE-OPERATIVE DIAGNOSIS:  Herniated nucleus pulposis of lumbosacral region  POST-OPERATIVE DIAGNOSIS:  Herniated Nucleous Pulposus of Lumbosacral region  PROCEDURE:  Procedure(s): Re operative Right Lumbar Three-Four Laminectomy and discectomy with Left Lumbar Three-Four, Bilateral Lumbar Four-Five Laminectomy for decompression (N/A)  Discharge Diagnoses:  Same Active Problems:   HNP (herniated nucleus pulposus), lumbar   Discharged Condition: Stable  Hospital Course:  Lauren Winters is a 51 y.o. female was admitted for the above procedure. There was no post operative complications. Her pain was well controlled. Tolerating po. Up ambulating with PT.  Discharge Exam: Blood pressure 129/69, pulse (!) 56, temperature 97.5 F (36.4 C), resp. rate 18, weight 97.5 kg (215 lb), SpO2 95 %. Awake, alert, oriented Speech fluent, appropriate CN grossly intact 5/5 BUE/BLE Wound c/d/i  Disposition: 01-Home or Self Care  Discharge Instructions    Call MD for:  difficulty breathing, headache or visual disturbances    Complete by:  As directed    Call MD for:  persistant dizziness or light-headedness    Complete by:  As directed    Call MD for:  redness, tenderness, or signs of infection (pain, swelling, redness, odor or green/yellow discharge around incision site)    Complete by:  As directed    Call MD for:  severe uncontrolled pain    Complete by:  As directed    Call MD for:  temperature >100.4    Complete by:  As directed    Diet general    Complete by:  As directed    Driving Restrictions    Complete by:  As directed    Do not drive until given clearance.   Incentive spirometry RT    Complete by:  As directed    Increase activity slowly    Complete by:  As directed    Lifting restrictions    Complete by:  As  directed    Do not lift anything >10lbs. Avoid bending and twisting in awkward positions. Avoid bending at the back.   May shower / Bathe    Complete by:  As directed    In 24 hours. Okay to wash wound with warm soapy water. Avoid scrubbing the wound. Pat dry.   Remove dressing in 24 hours    Complete by:  As directed      Allergies as of 03/30/2017      Reactions   Clindamycin/lincomycin Hives   Adhesive [tape] Other (See Comments)   Sensitivity to certain band aids and adhesives - burns skin, makes skin raw   Sudafed [pseudoephedrine Hcl] Other (See Comments)   Dizziness       Medication List    STOP taking these medications   methocarbamol 500 MG tablet Commonly known as:  ROBAXIN     TAKE these medications   amLODipine 5 MG tablet Commonly known as:  NORVASC Take 1 tablet (5 mg total) by mouth 2 (two) times daily. What changed:  how much to take  when to take this   buPROPion 150 MG 24 hr tablet Commonly known as:  WELLBUTRIN XL Take 1 tablet (150 mg total) by mouth daily.   CLOBETA CREAM EX Apply 1 application topically daily as needed (skin irritation).   cloNIDine 0.1 MG tablet Commonly known as:  CATAPRES Take 1 tablet (0.1 mg total) by mouth daily.   cyclobenzaprine 10 MG  tablet Commonly known as:  FLEXERIL Take 1 tablet (10 mg total) by mouth 3 (three) times daily as needed for muscle spasms.   diclofenac sodium 1 % Gel Commonly known as:  VOLTAREN Apply 1 application topically daily as needed.   escitalopram 20 MG tablet Commonly known as:  LEXAPRO Take 1 tablet (20 mg total) by mouth daily.   folic acid 1 MG tablet Commonly known as:  FOLVITE Take 2 tablets (2 mg total) by mouth daily.   gabapentin 300 MG capsule Commonly known as:  NEURONTIN Take 300 mg by mouth 2 (two) times daily.   Halcinonide 0.1 % Oint Apply 1 application topically 2 (two) times daily as needed.   HYDROcodone-acetaminophen 7.5-325 MG tablet Commonly known as:   NORCO Take 1-2 tablets by mouth every 4 (four) hours as needed for moderate pain.   hydroxychloroquine 200 MG tablet Commonly known as:  PLAQUENIL Take 1 tablet (200 mg total) by mouth 2 (two) times daily.   hydrOXYzine 25 MG tablet Commonly known as:  ATARAX/VISTARIL Take 1 tablet (25 mg total) by mouth 2 (two) times daily as needed for anxiety. What changed:  when to take this   metFORMIN 1000 MG tablet Commonly known as:  GLUCOPHAGE Take 1 tablet (1,000 mg total) by mouth 2 (two) times daily with a meal.   Methotrexate (PF) 20 MG/0.4ML Soaj Inject 0.8 mLs into the skin once a week. Every Tuesday   methotrexate 2.5 MG tablet Commonly known as:  RHEUMATREX Take 2.5 mg by mouth once a week. Caution:Chemotherapy. Protect from light.   metoprolol 100 MG tablet Commonly known as:  LOPRESSOR Take 1 tablet (100 mg total) by mouth 2 (two) times daily. What changed:  when to take this   pilocarpine 5 MG tablet Commonly known as:  SALAGEN Take 5 mg by mouth daily as needed.   tacrolimus 0.1 % ointment Commonly known as:  PROTOPIC Apply 1 application topically 2 (two) times daily as needed.   triamcinolone 0.1 % cream : eucerin Crea Apply 1 application topically 2 (two) times daily as needed for rash or irritation.   valsartan-hydrochlorothiazide 320-25 MG tablet Commonly known as:  DIOVAN-HCT Take 1 tablet by mouth daily.   Vitamin D (Ergocalciferol) 50000 units Caps capsule Commonly known as:  DRISDOL Take 1 capsule (50,000 Units total) by mouth every 7 (seven) days.      Follow-up Information    Kevan Ny Ditty, MD. Schedule an appointment as soon as possible for a visit in 3 week(s).   Specialty:  Neurosurgery Contact information: [address] [place] [address] [phone]           Signed: Traci Sermon 03/30/2017, 8:48 AM

## 2017-03-31 ENCOUNTER — Ambulatory Visit: Payer: BLUE CROSS/BLUE SHIELD | Admitting: Psychology

## 2017-03-31 LAB — GLUCOSE, CAPILLARY
Glucose-Capillary: 146 mg/dL — ABNORMAL HIGH (ref 65–99)
Glucose-Capillary: 148 mg/dL — ABNORMAL HIGH (ref 65–99)

## 2017-03-31 NOTE — Progress Notes (Signed)
Still with headaches Tremors improving Moves legs well Home today

## 2017-03-31 NOTE — Progress Notes (Signed)
Discharge orders received.  Discharge instructions and follow-up appointments reviewed with the patient and her husband.  VSS upon discharge.  IV removed and education complete.  Transported out via wheelchair.   Cori Razor, RN

## 2017-03-31 NOTE — Progress Notes (Signed)
Physical Therapy Treatment Patient Details Name: Lauren Winters MRN: [id number] DOB: 1966-07-30 Today's Date: 03/31/2017    History of Present Illness 51 y.o. female s/p right L3-L4 laminectomy on 03/29/17. PMH significant for anxiety, depression, HTN, fibromyalgia, lupus, Raynaud's, and tobacco abuse.     PT Comments    Pt still stating headache symptoms and has new pain in her R leg.  Despite these symptoms, she showed improvements in mobility and ambulation this session, requiring supervision for bed mobility, min guard for ambulation while relying heavily on RW, and  Min hand held assist for stairs.  She is recalling and following precautions well.  Education was provided on stair management.  She will benefit from HHPT to reach PLOF by increasing strength, balance, and safety awareness.  Follow Up Recommendations  Home health PT;Supervision/Assistance - 24 hour     Equipment Recommendations       Recommendations for Other Services       Precautions / Restrictions Precautions Precautions: Back;Fall Precaution Comments: 3/3 precautions recalled. Restrictions Weight Bearing Restrictions: No    Mobility  Bed Mobility Overal bed mobility: Needs Assistance Bed Mobility: Sit to Sidelying;Rolling Rolling: Supervision Sidelying to sit: Supervision     Sit to sidelying: Supervision General bed mobility comments: Cues for sequence and safety with back precautions.  Pt states she didnt realize how much she used to twist with bed mobility.  Reeducated on importance of precaution mantenance.  Transfers   Equipment used: Rolling walker (2 wheeled) Transfers: Sit to/from Stand Sit to Stand: [employer] transfer comment: cues for hand placement.  Initially puts hands on walker for stand <> sit.  Ambulation/Gait Ambulation/Gait assistance: Min guard Ambulation Distance (Feet): 90 Feet Assistive device: Rolling walker (2 wheeled) Gait Pattern/deviations:  Step-through pattern;Decreased stride length;Trunk flexed;Wide base of support Gait velocity: Decreased Gait velocity interpretation: Below normal speed for age/gender General Gait Details: VC's for posture, relaxing shoulders, breathing, and loosening grip on walker.  1 sitting rest break and a couple standing rest breaks needed.   Stairs Stairs: Yes   Stair Management: No rails;Step to pattern;Sideways Number of Stairs: 2 (x 2) General stair comments: Pt States she usually goes up stairs sideways.  Instructed to use step 2 pattern and required +1 hand held assist for ascent. +2 hand held assist for descent.  Completed 2 trials, first was unsteady and second was steadier and pt seemed more confident.  Wheelchair Mobility    Modified Rankin (Stroke Patients Only)       Balance Overall balance assessment: Needs assistance Sitting-balance support: Feet supported;Bilateral upper extremity supported Sitting balance-Leahy Scale: Good     Standing balance support: Bilateral upper extremity supported Standing balance-Leahy Scale: Poor Standing balance comment: reliant on RW                            Cognition Arousal/Alertness: Awake/alert Behavior During Therapy: WFL for tasks assessed/performed Overall Cognitive Status: Within Functional Limits for tasks assessed                                        Exercises      General Comments General comments (skin integrity, edema, etc.): Pt states her hips have always given her pain, but 6/10 pain in R groin is new since surgery.  Pertinent Vitals/Pain Pain Assessment: 0-10 Pain Score: 8  Pain Location: Head/base of neck; 6/10 at right groin to ant thigh described as combination of pressure and sore. Pain Descriptors / Indicators: Constant;Headache;Pressure;Sore Pain Intervention(s): Limited activity within patient's tolerance;Monitored during session;Repositioned;Heat applied;Premedicated before  session    Home Living                      Prior Function            PT Goals (current goals can now be found in the care plan section) Acute Rehab PT Goals Patient Stated Goal: go home PT Goal Formulation: With patient Time For Goal Achievement: 04/14/17 Potential to Achieve Goals: Fair Progress towards PT goals: Progressing toward goals    Frequency    Min 5X/week      PT Plan Current plan remains appropriate    Co-evaluation             End of Session Equipment Utilized During Treatment: Gait belt Activity Tolerance: Patient limited by pain Patient left: in bed;with call bell/phone within reach;with bed alarm set;with family/visitor present Nurse Communication: Mobility status PT Visit Diagnosis: Unsteadiness on feet (R26.81);Difficulty in walking, not elsewhere classified (R26.2);Pain Pain - part of body:  (Neck, head, R groin)     Time: 6378-5885 PT Time Calculation (min) (ACUTE ONLY): 25 min  Charges:                       G Codes:       Lauren Winters SPT   Lauren Winters 03/31/2017, 12:34 PM

## 2017-03-31 NOTE — Care Management Note (Signed)
Case Management Note  Patient Details  Name: Lauren Winters MRN: [id number] Date of Birth: 1966-06-05  Subjective/Objective:                 Spoke with patient at the bedside. She will DC to home today, would like to use [facility] for [place] [facility]. Referral made to Santiago Glad with [facility]. Patient would also like a RW for use at home, referral made to [facility] for delivery to room prior to discharge.   Action/Plan:  No other CM needs identified at this time. Expected Discharge Date:  03/31/17               Expected Discharge Plan:  [place]  In-House Referral:     Discharge planning Services  CM Consult  Post Acute Care Choice:  Home Health, Durable Medical Equipment Choice offered to:  Patient  DME Arranged:  Walker rolling DME Agency:  [place]:  PT [place]:  [place]  Status of Service:  Completed, signed off  If discussed at [place] of Stay Meetings, dates discussed:    Additional Comments:  Carles Collet, RN 03/31/2017, 10:22 AM

## 2017-04-01 NOTE — Op Note (Signed)
03/29/2017  9:48 AM  PATIENT:  Lauren Winters  51 y.o. 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  SURGEON:  Aldean Ast, MD  ASSISTANTS: Kristeen Miss, MD  ANESTHESIA:   General  DRAINS: None   SPECIMEN:  None  INDICATION FOR PROCEDURE: 51 year old woman with intractable lumbar radicular pain.  She has previously undergone a right L3-4 laminotomy and discectomy.  She has a recurrent disc herniation at L3-4 on the right and bilateral stenosis at L3-4 and L4-5.  I have recommended the above operation. Patient understood the risks, benefits, and alternatives and potential outcomes and wished to proceed.  PROCEDURE DETAILS: After smooth induction of general endotracheal anesthesia the patient was turned prone on the operating table on a Wilson frame. The skin of the lumbar region was clipped of hair. It was wiped down with alcohol. The patient was then prepped and draped in usual sterile fashion.   The subcutaneous tissues of the midline from approximately L3 to L5 was infiltrated with Marcaine. The skin in this area was opened sharply and the existing incision was excised. The soft tissues were dissected with monopolar cautery. Subperiosteal dissection was carried out along the sides of the spinous processes and the laminar surfaces to the medial edge of the facet joints from L3 to L5. The right L3-4 area was dissected last because the normal landmarks were obscured by scar.  The spinous processes were removed with rongeurs. The laminae were then thinned with a high-speed bur. The remaining lamina was resected with a Kerrison punch. The scar at the laminotomy defect on  the right at L3-4 was separated from the bone edges with curettes.  The bone edges were then thinned with the high speed drill and resected with a Kerrison rongeur.  Thickened ligamentum flavum was separated from the dura and resected with a Kerrison rongeur. The thecal sac was further freed from the hypertrophied ligament in the lateral recesses.  Lateral recess decompression was completed. Decompression was carried out laterally to the foramina. The foramina were palpated and found to be without residual stenosis.   The operating microscope was then draped and brought into the field to provide light and magnification.  Utilizing microsurgical technique the thecal sac and right L4 nerve root were separated from the ventral and lateral scar in the epidural space.  The disc herniation was encountered.  It was hard and calcified.  Using down angled curettes it was separated from the neural elements as best as possible.  It was resected to the greatest extent safely possible.  Palpation revealed that the neural elements were without persistent compression.  I irrigated vigorously with bacitracin saline. There was excellent hemostasis. I placed a mixture of toradol, depo medrol, and plain marcaine in the epidural space.  I placed vancomycin powder in the depths of the wounds.The wound was then closed in routine anatomic layers with interrupted vicryl suture. I injected exparel in the paraspinous muscles. The skin was closed with a running Monocryl strata fix suture. It was then sealed with Dermabond. The patient was turned to the supine position and allowed to awaken from anesthesia.   PATIENT DISPOSITION:  PACU - hemodynamically stable.   Delay start of Pharmacological VTE agent (>24hrs) due to surgical blood  loss or risk of bleeding:  yes

## 2017-04-13 ENCOUNTER — Encounter: Payer: Self-pay | Admitting: Primary Care

## 2017-04-14 ENCOUNTER — Ambulatory Visit (INDEPENDENT_AMBULATORY_CARE_PROVIDER_SITE_OTHER): Payer: BLUE CROSS/BLUE SHIELD | Admitting: Psychology

## 2017-04-14 ENCOUNTER — Other Ambulatory Visit: Payer: BLUE CROSS/BLUE SHIELD

## 2017-04-14 DIAGNOSIS — F330 Major depressive disorder, recurrent, mild: Secondary | ICD-10-CM | POA: Diagnosis not present

## 2017-04-14 DIAGNOSIS — F410 Panic disorder [episodic paroxysmal anxiety] without agoraphobia: Secondary | ICD-10-CM

## 2017-04-18 ENCOUNTER — Other Ambulatory Visit: Payer: Self-pay | Admitting: Primary Care

## 2017-04-18 ENCOUNTER — Other Ambulatory Visit (INDEPENDENT_AMBULATORY_CARE_PROVIDER_SITE_OTHER): Payer: BLUE CROSS/BLUE SHIELD

## 2017-04-18 DIAGNOSIS — E559 Vitamin D deficiency, unspecified: Secondary | ICD-10-CM

## 2017-04-18 DIAGNOSIS — E785 Hyperlipidemia, unspecified: Secondary | ICD-10-CM

## 2017-04-18 LAB — LIPID PANEL
CHOL/HDL RATIO: 4
Cholesterol: 169 mg/dL (ref 0–200)
HDL: 43.1 mg/dL (ref >39.00)
LDL Cholesterol: 90 mg/dL (ref 0–99)
NONHDL: 125.96
Triglycerides: 182 mg/dL — ABNORMAL HIGH (ref 0.0–149.0)
VLDL: 36.4 mg/dL (ref 0.0–40.0)

## 2017-04-18 LAB — VITAMIN D 25 HYDROXY (VIT D DEFICIENCY, FRACTURES): VITD: 18.48 ng/mL — AB (ref 30.00–100.00)

## 2017-04-18 MED ORDER — VITAMIN D (ERGOCALCIFEROL) 1.25 MG (50000 UNIT) PO CAPS: 50000.0000 [IU] | ORAL_CAPSULE | ORAL | 0 refills | Status: DC

## 2017-04-20 ENCOUNTER — Ambulatory Visit (INDEPENDENT_AMBULATORY_CARE_PROVIDER_SITE_OTHER): Payer: BLUE CROSS/BLUE SHIELD | Admitting: Rheumatology

## 2017-04-20 ENCOUNTER — Encounter: Payer: Self-pay | Admitting: Rheumatology

## 2017-04-20 VITALS — BP 130/84 | HR 82 | Resp 14 | Ht 67.0 in | Wt 214.0 lb

## 2017-04-20 DIAGNOSIS — M545 Low back pain: Secondary | ICD-10-CM | POA: Diagnosis not present

## 2017-04-20 DIAGNOSIS — K1379 Other lesions of oral mucosa: Secondary | ICD-10-CM

## 2017-04-20 DIAGNOSIS — G8929 Other chronic pain: Secondary | ICD-10-CM | POA: Diagnosis not present

## 2017-04-20 DIAGNOSIS — Z79899 Other long term (current) drug therapy: Secondary | ICD-10-CM | POA: Diagnosis not present

## 2017-04-20 DIAGNOSIS — M3219 Other organ or system involvement in systemic lupus erythematosus: Secondary | ICD-10-CM | POA: Diagnosis not present

## 2017-04-20 DIAGNOSIS — M3500 Sicca syndrome, unspecified: Secondary | ICD-10-CM

## 2017-04-20 DIAGNOSIS — M5126 Other intervertebral disc displacement, lumbar region: Secondary | ICD-10-CM

## 2017-04-20 DIAGNOSIS — M170 Bilateral primary osteoarthritis of knee: Secondary | ICD-10-CM

## 2017-04-20 DIAGNOSIS — M19042 Primary osteoarthritis, left hand: Secondary | ICD-10-CM

## 2017-04-20 DIAGNOSIS — M19041 Primary osteoarthritis, right hand: Secondary | ICD-10-CM

## 2017-04-20 DIAGNOSIS — M47816 Spondylosis without myelopathy or radiculopathy, lumbar region: Secondary | ICD-10-CM

## 2017-04-20 DIAGNOSIS — R768 Other specified abnormal immunological findings in serum: Secondary | ICD-10-CM

## 2017-04-20 MED ORDER — METHOTREXATE SODIUM CHEMO INJECTION 50 MG/2ML: 20.0000 mg | INTRAMUSCULAR | 0 refills | Status: AC

## 2017-04-20 MED ORDER — MAGIC MOUTHWASH W/LIDOCAINE: ORAL | 2 refills | Status: DC

## 2017-04-20 MED ORDER — FOLIC ACID 1 MG PO TABS
2.0000 mg | ORAL_TABLET | Freq: Every day | ORAL | 4 refills | Status: AC
Start: 2017-04-20 — End: 2018-07-14

## 2017-04-20 MED ORDER — HYDROXYCHLOROQUINE SULFATE 200 MG PO TABS: 200.0000 mg | ORAL_TABLET | Freq: Two times a day (BID) | ORAL | 0 refills | Status: DC

## 2017-04-20 NOTE — Progress Notes (Signed)
Office Visit Note  Patient: Lauren Winters             Date of Birth: [birth year]           MRN: [id number]             PCP: Sheral Flow, NP Referring: Pleas Koch, NP Visit Date: 04/20/2017 Occupation: _0 @    Subjective:  Pain of the Right Hand; Pain of the Right Knee; and Back Pain (lumbar surgery )   History of Present Illness: Lauren Winters is a 51 y.o. female  Hx of SLE On mtx 2.7PO/EUMP; folic acid 43m qd plq [id number]  Patient reports that she's been having a lot more oral and nasal ulcers lately. She reports that one of her providers wrote her duke's Magic mouthwash in the past but she does not feel that that was quite effective for her. I reminded the patient that there are many versions of duke's Magic mouthwash and she may want to retry a prescription written by u[place]ain case it is more effective for her oral ulcers. Patient is agreeable  She is also complaining of arthralgia to bilateral hands.  Currently she seeing MRaliegh Ipfor injections in her back. She is also having knee joint discomfort with pain to the medial aspect of the right knee.  She also complains of ongoing fatigue. She does not miss any doses of her lupus.  We discussed option of using Benlysta in the future if it proves that she is not well-controlled with her lupus.    Activities of Daily Living:  Patient reports morning stiffness for 30 minutes.   Patient Reports nocturnal pain.  Difficulty dressing/grooming: Reports Difficulty climbing stairs: Reports Difficulty getting out of chair: Reports Difficulty using hands for taps, buttons, cutlery, and/or writing: Reports   Review of Systems  Constitutional: Negative for fatigue.  HENT: Negative for mouth sores and mouth dryness.   Eyes: Negative for dryness.  Respiratory: Negative for shortness of breath.   Gastrointestinal: Negative for constipation and diarrhea.  Musculoskeletal: Negative for myalgias and  myalgias.  Skin: Negative for sensitivity to sunlight.  Psychiatric/Behavioral: Negative for decreased concentration and sleep disturbance.    PMFS History:  Patient Active Problem List   Diagnosis Date Noted  . HNP (herniated nucleus pulposus), lumbar 03/29/2017  . Chronic right shoulder pain 02/18/2017  . Primary osteoarthritis of both hands 12/22/2016  . Primary osteoarthritis of both knees 12/22/2016  . Spondylosis of lumbar region without myelopathy or radiculopathy 12/22/2016  . Fibromyalgia 12/22/2016  . History of hypertension 12/22/2016  . Recurrent oral ulcers 12/22/2016  . Hair loss 12/22/2016  . Preventative health care 04/19/2016  . Chronic back pain 12/17/2015  . Hyperlipidemia 08/29/2015  . Lupus (systemic lupus erythematosus) ([place] 08/04/2015  . Rheumatoid factor positive 08/04/2015  . Sjogren's syndrome ([place] 08/04/2015  . Mitral regurgitation   . Tobacco abuse   . Atypical chest pain   . Obesity (BMI 30-39.9)   . Anxiety   . Hypertension   . Diabetes mellitus without complication ([place]   . Clinical depression 05/23/2014    Past Medical History:  Diagnosis Date  . Anxiety   . Atypical chest pain    a. ETT 05/2014: normal stress test without evidence of myocardial ischemia or chest pain at peak stress  . Depression   . Diabetes mellitus without complication ([place] metformin   dx 2015  . Fibromyalgia   . Heart murmur    told back in  2007, but no mention with 2016 cardiac w/u  . Hypertension   . Lupus (systemic lupus erythematosus) ([place])   . Mitral regurgitation    a. 05/2014 echo: EF >55%, nl LV/RV sys fxn, mild MR/AR, no valvular stenosis   . Obesity (BMI 30-39.9)   . Raynaud's phenomenon   . Rheumatoid arthritis ([place])   . Sjogren's disease ([place])   . Tobacco abuse    a. ongoing; b. ongoing SOB    Family History  Problem Relation Age of Onset  . Hypertension Mother   . Valvular heart disease Mother   . CAD Father 13    CABG x 4  . Liver cancer  Father 67    passed  . CAD Paternal Aunt     CABG  . CAD Paternal Aunt     CABG  . CAD Paternal Aunt     CABG  . CAD Paternal Aunt     CABG  . CAD Paternal Aunt     CABG  . CAD Paternal Uncle     MI s/p CABG  . Stroke Maternal Grandmother   . Stroke Maternal Grandfather   . CAD Maternal Grandfather    Past Surgical History:  Procedure Laterality Date  . ABDOMINAL HYSTERECTOMY    . CESAREAN SECTION     x3  . LUMBAR LAMINECTOMY    . LUMBAR LAMINECTOMY/DECOMPRESSION MICRODISCECTOMY N/A 03/29/2017   Procedure: Re operative Right Lumbar Three-Four Laminectomy and discectomy with Left Lumbar Three-Four, Bilateral Lumbar Four-Five Laminectomy for decompression;  Surgeon: Kevan Ny Ditty, MD;  Location: [facility];  Service: Neurosurgery;  Laterality: N/A;  . TUBAL LIGATION     Social History   Social History Narrative   Married.   3 children.   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      Objective: Vital Signs: BP 130/84   Pulse 82   Resp 14   Ht _0  (1.702 m)   Wt 214 lb (97.1 kg)   BMI 33.52 kg/m    Physical Exam  Constitutional: She is oriented to person, place, and time. She appears well-developed and well-nourished.  HENT:  Head: Normocephalic and atraumatic.  Eyes: EOM are normal. Pupils are equal, round, and reactive to light.  Cardiovascular: Normal rate, regular rhythm and normal heart sounds.  Exam reveals no gallop and no friction rub.   No murmur heard. Pulmonary/Chest: Effort normal and breath sounds normal. She has no wheezes. She has no rales.  Abdominal: Soft. Bowel sounds are normal. She exhibits no distension. There is no tenderness. There is no guarding. No hernia.  Musculoskeletal: Normal range of motion. She exhibits no edema, tenderness or deformity.  Lymphadenopathy:    She has no cervical adenopathy.  Neurological: She is alert and oriented to person, place, and time. Coordination normal.  Skin: Skin is  warm and dry. Capillary refill takes less than 2 seconds. No rash noted.  Psychiatric: She has a normal mood and affect. Her behavior is normal.  Nursing note and vitals reviewed.    Musculoskeletal Exam:  Full range of motion of all joints except approximately 100 of abduction of bilateral shoulders Grip strength is equal and strong bilaterally Fibromyalgia tender points are absent  CDAI Exam: CDAI Homunculus Exam:   Joint Counts:  CDAI Tender Joint count: 0 CDAI Swollen Joint count: 0  Global Assessments:  Patient Global Assessment: 8 Provider Global Assessment: 8  CDAI Calculated Score: 16    Investigation: No additional findings.  Lab on 04/18/2017  Component Date Value Ref Range Status  . Cholesterol 04/18/2017 169  0 - 200 mg/dL Final  . Triglycerides 04/18/2017 182.0* 0.0 - 149.0 mg/dL Final  . HDL 04/18/2017 43.10  >39.00 mg/dL Final  . VLDL 04/18/2017 36.4  0.0 - 40.0 mg/dL Final  . LDL Cholesterol 04/18/2017 90  0 - 99 mg/dL Final  . Total CHOL/HDL Ratio 04/18/2017 4   Final  . NonHDL 04/18/2017 125.96   Final  . VITD 04/18/2017 18.48* 30.00 - 100.00 ng/mL Final  Admission on 03/29/2017, Discharged on 03/31/2017  Component Date Value Ref Range Status  . Glucose-Capillary 03/29/2017 219* 65 - 99 mg/dL Final  . Comment 1 03/29/2017 Notify RN   Final  . Comment 2 03/29/2017 Document in Chart   Final  . Glucose-Capillary 03/29/2017 209* 65 - 99 mg/dL Final  . Glucose-Capillary 03/29/2017 270* 65 - 99 mg/dL Final  . WBC 03/30/2017 12.0* 4.0 - 10.5 K/uL Final  . RBC 03/30/2017 4.52  3.87 - 5.11 MIL/uL Final  . Hemoglobin 03/30/2017 12.6  12.0 - 15.0 g/dL Final  . HCT 03/30/2017 37.7  36.0 - 46.0 % Final  . MCV 03/30/2017 83.4  78.0 - 100.0 fL Final  . MCH 03/30/2017 27.9  26.0 - 34.0 pg Final  . MCHC 03/30/2017 33.4  30.0 - 36.0 g/dL Final  . RDW 03/30/2017 12.9  11.5 - 15.5 % Final  . Platelets 03/30/2017 289  150 - 400 K/uL Final  . Sodium 03/30/2017 137   135 - 145 mmol/L Final  . Potassium 03/30/2017 4.5  3.5 - 5.1 mmol/L Final  . Chloride 03/30/2017 100* 101 - 111 mmol/L Final  . CO2 03/30/2017 30  22 - 32 mmol/L Final  . Glucose, Bld 03/30/2017 197* 65 - 99 mg/dL Final  . BUN 03/30/2017 12  6 - 20 mg/dL Final  . Creatinine, Ser 03/30/2017 0.87  0.44 - 1.00 mg/dL Final  . Calcium 03/30/2017 9.1  8.9 - 10.3 mg/dL Final  . GFR calc non Af Amer 03/30/2017 >60  >60 mL/min Final  . GFR calc Af Amer 03/30/2017 >60  >60 mL/min Final   Comment: (NOTE) The eGFR has been calculated using the CKD EPI equation. This calculation has not been validated in all clinical situations. eGFR's persistently <60 mL/min signify possible Chronic Kidney Disease.   . Anion gap 03/30/2017 7  5 - 15 Final  . Prothrombin Time 03/30/2017 13.7  11.4 - 15.2 seconds Final  . INR 03/30/2017 1.05   Final  . aPTT 03/30/2017 29  24 - 36 seconds Final  . Glucose-Capillary 03/29/2017 331* 65 - 99 mg/dL Final  . Comment 1 03/29/2017 Notify RN   Final  . Comment 2 03/29/2017 Document in Chart   Final  . Glucose-Capillary 03/30/2017 178* 65 - 99 mg/dL Final  . Comment 1 03/30/2017 Notify RN   Final  . Comment 2 03/30/2017 Document in Chart   Final  . Glucose-Capillary 03/30/2017 233* 65 - 99 mg/dL Final  . Glucose-Capillary 03/30/2017 163* 65 - 99 mg/dL Final  . Comment 1 03/30/2017 Notify RN   Final  . Comment 2 03/30/2017 Document in Chart   Final  . Glucose-Capillary 03/30/2017 199* 65 - 99 mg/dL Final  . Comment 1 03/30/2017 Notify RN   Final  . Comment 2 03/30/2017 Document in Chart   Final  . Glucose-Capillary 03/31/2017 146* 65 - 99 mg/dL Final  . Comment 1 03/31/2017 Notify RN   Final  .  Comment 2 03/31/2017 Document in Chart   Final  . Glucose-Capillary 03/31/2017 148* 65 - 99 mg/dL Final  . Comment 1 03/31/2017 Notify RN   Final  . Comment 2 03/31/2017 Document in Chart   Final  Hospital Outpatient Visit on 03/21/2017  Component Date Value Ref Range Status    . Glucose-Capillary 03/21/2017 167* 65 - 99 mg/dL Final  . MRSA, PCR 03/21/2017 NEGATIVE  NEGATIVE Final  . Staphylococcus aureus 03/21/2017 NEGATIVE  NEGATIVE Final   Comment:        The Xpert SA Assay (FDA approved for NASAL specimens in patients over 73 years of age), is one component of a comprehensive surveillance program.  Test performance has been validated by [facility] for patients greater than or equal to 58 year old. It is not intended to diagnose infection nor to guide or monitor treatment.   . WBC 03/21/2017 7.8  4.0 - 10.5 K/uL Final  . RBC 03/21/2017 4.98  3.87 - 5.11 MIL/uL Final  . Hemoglobin 03/21/2017 14.1  12.0 - 15.0 g/dL Final  . HCT 03/21/2017 41.7  36.0 - 46.0 % Final  . MCV 03/21/2017 83.7  78.0 - 100.0 fL Final  . MCH 03/21/2017 28.3  26.0 - 34.0 pg Final  . MCHC 03/21/2017 33.8  30.0 - 36.0 g/dL Final  . RDW 03/21/2017 12.9  11.5 - 15.5 % Final  . Platelets 03/21/2017 274  150 - 400 K/uL Final  . Sodium 03/21/2017 138  135 - 145 mmol/L Final  . Potassium 03/21/2017 3.5  3.5 - 5.1 mmol/L Final  . Chloride 03/21/2017 99* 101 - 111 mmol/L Final  . CO2 03/21/2017 28  22 - 32 mmol/L Final  . Glucose, Bld 03/21/2017 137* 65 - 99 mg/dL Final  . BUN 03/21/2017 6  6 - 20 mg/dL Final  . Creatinine, Ser 03/21/2017 0.82  0.44 - 1.00 mg/dL Final  . Calcium 03/21/2017 10.0  8.9 - 10.3 mg/dL Final  . GFR calc non Af Amer 03/21/2017 >60  >60 mL/min Final  . GFR calc Af Amer 03/21/2017 >60  >60 mL/min Final   Comment: (NOTE) The eGFR has been calculated using the CKD EPI equation. This calculation has not been validated in all clinical situations. eGFR's persistently <60 mL/min signify possible Chronic Kidney Disease.   . Anion gap 03/21/2017 11  5 - 15 Final  . Hgb A1c MFr Bld 03/21/2017 7.4* 4.8 - 5.6 % Final   Comment: (NOTE)         Pre-diabetes: 5.7 - 6.4         Diabetes: >6.4         Glycemic control for adults with diabetes: <7.0   . Mean Plasma  Glucose 03/21/2017 166  mg/dL Final   Comment: (NOTE) Performed At: [facility] [place], [place] [id number] Lindon Romp MD NI:[id number]   Office Visit on 12/23/2016  Component Date Value Ref Range Status  . Anit Nuclear Antibody(ANA) 12/23/2016 NEG  NEGATIVE Final  . ds DNA Ab 12/23/2016 1  IU/mL Final   Comment:                                 IU/mL       Interpretation                               <  or = 4    Negative                               5-9         Indeterminate                               > or = 10   Positive     . C3 Complement 12/23/2016 167  90 - 180 mg/dL Final  . C4 Complement 12/23/2016 24  16 - 47 mg/dL Final  . Color, Urine 12/23/2016 YELLOW  YELLOW Final  . APPearance 12/23/2016 TURBID* CLEAR Final  . Specific Gravity, Urine 12/23/2016 1.020  1.001 - 1.035 Final  . pH 12/23/2016 5.5  5.0 - 8.0 Final  . Glucose, UA 12/23/2016 1+* NEGATIVE Final  . Bilirubin Urine 12/23/2016 NEGATIVE  NEGATIVE Final  . Ketones, ur 12/23/2016 NEGATIVE  NEGATIVE Final  . Hgb urine dipstick 12/23/2016 NEGATIVE  NEGATIVE Final  . Protein, ur 12/23/2016 NEGATIVE  NEGATIVE Final  . Nitrite 12/23/2016 NEGATIVE  NEGATIVE Final  . Leukocytes, UA 12/23/2016 NEGATIVE  NEGATIVE Final  . Sed Rate 12/23/2016 24  0 - 30 mm/hr Final  . Vit D, 25-Hydroxy 12/23/2016 28* 30 - 100 ng/mL Final   Comment: Vitamin D Status           25-OH Vitamin D        Deficiency                <20 ng/mL        Insufficiency         20 - 29 ng/mL        Optimal             > or = 30 ng/mL   For 25-OH Vitamin D testing on patients on D2-supplementation and patients for whom quantitation of D2 and D3 fractions is required, the QuestAssureD 25-OH VIT D, (D2,D3), LC/MS/MS is recommended: order code [phone] [phone] (patients > 2 yrs).   . WBC 12/23/2016 6.0  3.8 - 10.8 K/uL Final  . RBC 12/23/2016 4.59  3.80 - 5.10 MIL/uL Final  . Hemoglobin 12/23/2016 13.7  11.7 - 15.5 g/dL Final  .  HCT 12/23/2016 40.7  35.0 - 45.0 % Final  . MCV 12/23/2016 88.7  80.0 - 100.0 fL Final  . MCH 12/23/2016 29.8  27.0 - 33.0 pg Final  . MCHC 12/23/2016 33.7  32.0 - 36.0 g/dL Final  . RDW 12/23/2016 15.0  11.0 - 15.0 % Final  . Platelets 12/23/2016 378  140 - 400 K/uL Final  . MPV 12/23/2016 9.1  7.5 - 12.5 fL Final  . Neutro Abs 12/23/2016 4440  1,500 - 7,800 cells/uL Final  . Lymphs Abs 12/23/2016 1020  850 - 3,900 cells/uL Final  . Monocytes Absolute 12/23/2016 360  200 - 950 cells/uL Final  . Eosinophils Absolute 12/23/2016 180  15 - 500 cells/uL Final  . Basophils Absolute 12/23/2016 0  0 - 200 cells/uL Final  . Neutrophils Relative % 12/23/2016 74  % Final  . Lymphocytes Relative 12/23/2016 17  % Final  . Monocytes Relative 12/23/2016 6  % Final  . Eosinophils Relative 12/23/2016 3  % Final  . Basophils Relative 12/23/2016 0  % Final  . Smear Review 12/23/2016 Criteria for review not met   Final  . Sodium 12/23/2016 139  135 - 146 mmol/L Final  . Potassium 12/23/2016 4.2  3.5 - 5.3 mmol/L Final  . Chloride 12/23/2016 103  98 - 110 mmol/L Final  . CO2 12/23/2016 25  20 - 31 mmol/L Final  . Glucose, Bld 12/23/2016 167* 65 - 99 mg/dL Final  . BUN 12/23/2016 9  7 - 25 mg/dL Final  . Creat 12/23/2016 0.81  0.50 - 1.05 mg/dL Final   Comment:   For patients > or = 53 years of age: The upper reference limit for Creatinine is approximately 13% higher for people identified as African-American.     . Total Bilirubin 12/23/2016 0.6  0.2 - 1.2 mg/dL Final  . Alkaline Phosphatase 12/23/2016 87  33 - 130 U/L Final  . AST 12/23/2016 20  10 - 35 U/L Final  . ALT 12/23/2016 11  6 - 29 U/L Final  . Total Protein 12/23/2016 7.2  6.1 - 8.1 g/dL Final  . Albumin 12/23/2016 4.3  3.6 - 5.1 g/dL Final  . Calcium 12/23/2016 9.6  8.6 - 10.4 mg/dL Final  . GFR, Est African American 12/23/2016 >89  >=60 mL/min Final  . GFR, Est Non African American 12/23/2016 84  >=60 mL/min Final  . WBC, UA  12/23/2016 NONE SEEN  <=5 WBC/HPF Final  . RBC / HPF 12/23/2016 NONE SEEN  <=2 RBC/HPF Final  . Squamous Epithelial / LPF 12/23/2016 0-5  <=5 HPF Final  . Bacteria, UA 12/23/2016 NONE SEEN  NONE SEEN HPF Final  . Crystals 12/23/2016 See Below* NONE SEEN HPF Final  . Casts 12/23/2016 NONE SEEN  NONE SEEN LPF Final  . Yeast 12/23/2016 NONE SEEN  NONE SEEN HPF Final     Imaging: Dg Lumbar Spine 1 View  Result Date: 03/29/2017 CLINICAL DATA:  Intraoperative localization for spine surgery. EXAM: LUMBAR SPINE - 1 VIEW COMPARISON:  Lumbar spine MRI 02/01/2017 FINDINGS: There are retractors noted posteriorly at the L3-4 and L4-5 levelsa the MTP surgical instrument marking the L4-5 level. IMPRESSION: L4-5 marked intraoperatively. Electronically Signed   By: Marijo Sanes M.D.   On: 03/29/2017 08:36    Speciality Comments: No specialty comments available.    Procedures:  No procedures performed Allergies: Clindamycin/lincomycin; Adhesive [tape]; Prednisone; and Sudafed [pseudoephedrine hcl]   Assessment / Plan:     Visit Diagnoses: Other systemic lupus erythematosus with other organ involvement ([place]) - Plan: COMPLETE METABOLIC PANEL WITH GFR, CBC with Differential/Platelet, Sedimentation rate, C3 and C4, ANA, Anti-DNA antibody, double-stranded, Urinalysis, Routine w reflex microscopic  High risk medications (not anticoagulants) long-term use - 04/20/2017: Methotrexate 0.8 ML's every week; folic acid 2 mg every day; Plaquenil 200 mg twice a day; Plaquenil eye exam normal and up-to-date per pt. - Plan: COMPLETE METABOLIC PANEL WITH GFR, CBC with Differential/Platelet, Sedimentation rate, C3 and C4, ANA, Anti-DNA antibody, double-stranded, Urinalysis, Routine w reflex microscopic  Spondylosis of lumbar region without myelopathy or radiculopathy  Chronic midline low back pain, with sciatica presence unspecified  Sjogren's syndrome, with unspecified organ involvement ([facility])  Rheumatoid factor  positive  Recurrent oral ulcers  HNP (herniated nucleus pulposus), lumbar  Primary osteoarthritis of both knees  Primary osteoarthritis of both hands   Plan: #1: Systemic lupus erythematosus. Having more oral or nasal ulcers lately according to the patient. She's tried Magic mouthwash in the past but she recalls that it was not effective. This was Magic mouthwash written by some other providers and the past. She is willing to try Magic mouthwash once again that we prescribe  #  2: High risk prescription Methotrexate 0.8 ML's every week; folic acid 2 mg every day; Plaquenil 200 mg twice a day  We added its Magic mouthwash for oral ulcer flares. Please see prescription for full details  #3: History of low back pain. Getting injections to Dr. Cherre Huger office  #4: Bilateral knee joint OA. Patient would benefit from Visco supplementation Note: Patient cannot take cortisone because it causes chest pain She has failed and states, weight loss. She has osteoarthritis of the knees. And chronic knee pain. She's having a hard time squatting and great deal of stiffness after extended sitting. She would like to do Visco supplementation  Apply for Visco supplementation both knees. Hyalgan preferred but Euflexxa or Orthovisc are acceptable.  #5: Return to clinic in 3 months  Orders: Orders Placed This Encounter  Procedures  . COMPLETE METABOLIC PANEL WITH GFR  . CBC with Differential/Platelet  . Sedimentation rate  . C3 and C4  . ANA  . Anti-DNA antibody, double-stranded  . Urinalysis, Routine w reflex microscopic   Meds ordered this encounter  Medications  . hydroxychloroquine (PLAQUENIL) 200 MG tablet    Sig: Take 1 tablet (200 mg total) by mouth 2 (two) times daily.    Dispense:  60 tablet    Refill:  0    Were giving 30 day supply of this medication; once patient's Plaquenil eye exam is normal and up-to-date, we will do a 90 day supply.    Order Specific Question:    Supervising Provider    Answer:   Lyda Perone  . methotrexate 50 MG/2ML injection    Sig: Inject 0.8 mLs (20 mg total) into the skin once a week.    Dispense:  9.6 mL    Refill:  0    Please give mtx ==> Ninety-day supply with preservatives only    Order Specific Question:   Supervising Provider    Answer:   Bo Merino [[id number]]  . folic acid (FOLVITE) 1 MG tablet    Sig: Take 2 tablets (2 mg total) by mouth daily.    Dispense:  180 tablet    Refill:  4    Order Specific Question:   Supervising Provider    Answer:   Bo Merino [[id number]]  . magic mouthwash w/lidocaine SOLN    Sig: diphenhydramine/ 2%  VISCOUS lidocaine /antacid-MAALOX (1:1:1); 15 mL swished in the mouth for 30 sec, then spit out.    May be used every 3 to 4 h    Dispense:  120 mL    Refill:  2    diphenhydramine/ 2%  VISCOUS lidocaine /antacid-MAALOX (1:1:1); 15 mL swished in the mouth for 30 sec, then spit out.    May be used every 3 to 4 h    Order Specific Question:   Supervising Provider    Answer:   Bo Merino [phone]    Face-to-face time spent with patient was 40 minutes. 50% of time was spent in counseling and coordination of care.  Follow-Up Instructions: Return in about 3 months (around 07/20/2017).   Eliezer Lofts, PA-C  Patient had no synovitis on my examination today. Her most recent labs have been stable. It is unlikely that she is having a lupus flare. We will monitor her closely. She has been advised to contact us in case she develops any new symptoms. I examined and evaluated the patient with Eliezer Lofts PA. The plan of care was discussed as noted above.  Bo Merino, MD Note -  This record has been created using [employer].  Chart creation errors have been sought, but may not always  have been located. Such creation errors do not reflect on  the standard of medical care.

## 2017-04-20 NOTE — Patient Instructions (Signed)
  Standing Labs We placed an order today for your standing lab work.    Please come back and get your standing labs in  In 3 months with follow-up visit (we'll do autoimmune workup at that time along with CBC with differential and CMP with GFR urinalysis); our lab personnel, Virgilio Belling,  Already has the lab order   We have open lab Monday through Friday from 8:30-11:30 AM and 1:30-4 PM at the office of Dr. Tresa Moore, PA.   The office is located at [address] [place], [place], [place] [address] No appointment is necessary.   Labs are drawn by [facility].  You may receive a bill from [facility] for your lab work.    ===============  Continue MTX 0.8 ML EVERY WEEK CONTINUE PLQ 200 MG BID  CONTINUE FOLIC ACID 2MG  EVERY DAY  =================== WE WILL ORDER GEL INJECTIONS FOR YOUR KNEES. WE WILL MAKE APPT FOR THOSE INJECTIONS ONCE APPROVED BY YOUR INSURANCE

## 2017-04-21 ENCOUNTER — Ambulatory Visit (INDEPENDENT_AMBULATORY_CARE_PROVIDER_SITE_OTHER): Payer: BLUE CROSS/BLUE SHIELD | Admitting: Psychology

## 2017-04-21 ENCOUNTER — Encounter: Payer: Self-pay | Admitting: Primary Care

## 2017-04-21 ENCOUNTER — Ambulatory Visit (INDEPENDENT_AMBULATORY_CARE_PROVIDER_SITE_OTHER): Payer: BLUE CROSS/BLUE SHIELD | Admitting: Primary Care

## 2017-04-21 ENCOUNTER — Other Ambulatory Visit: Payer: Self-pay | Admitting: Primary Care

## 2017-04-21 ENCOUNTER — Encounter: Payer: Self-pay | Admitting: Rheumatology

## 2017-04-21 VITALS — BP 140/86 | HR 82 | Temp 97.9°F | Ht 67.0 in | Wt 219.1 lb

## 2017-04-21 DIAGNOSIS — M545 Low back pain: Secondary | ICD-10-CM

## 2017-04-21 DIAGNOSIS — E119 Type 2 diabetes mellitus without complications: Secondary | ICD-10-CM

## 2017-04-21 DIAGNOSIS — E2839 Other primary ovarian failure: Secondary | ICD-10-CM | POA: Diagnosis not present

## 2017-04-21 DIAGNOSIS — F3342 Major depressive disorder, recurrent, in full remission: Secondary | ICD-10-CM

## 2017-04-21 DIAGNOSIS — Z1231 Encounter for screening mammogram for malignant neoplasm of breast: Secondary | ICD-10-CM

## 2017-04-21 DIAGNOSIS — Z0000 Encounter for general adult medical examination without abnormal findings: Secondary | ICD-10-CM

## 2017-04-21 DIAGNOSIS — I10 Essential (primary) hypertension: Secondary | ICD-10-CM

## 2017-04-21 DIAGNOSIS — F331 Major depressive disorder, recurrent, moderate: Secondary | ICD-10-CM | POA: Diagnosis not present

## 2017-04-21 DIAGNOSIS — G8929 Other chronic pain: Secondary | ICD-10-CM

## 2017-04-21 DIAGNOSIS — M3500 Sicca syndrome, unspecified: Secondary | ICD-10-CM

## 2017-04-21 DIAGNOSIS — M797 Fibromyalgia: Secondary | ICD-10-CM

## 2017-04-21 DIAGNOSIS — E669 Obesity, unspecified: Secondary | ICD-10-CM

## 2017-04-21 DIAGNOSIS — E785 Hyperlipidemia, unspecified: Secondary | ICD-10-CM

## 2017-04-21 DIAGNOSIS — F419 Anxiety disorder, unspecified: Secondary | ICD-10-CM

## 2017-04-21 DIAGNOSIS — M3219 Other organ or system involvement in systemic lupus erythematosus: Secondary | ICD-10-CM

## 2017-04-21 DIAGNOSIS — M5126 Other intervertebral disc displacement, lumbar region: Secondary | ICD-10-CM

## 2017-04-21 DIAGNOSIS — R200 Anesthesia of skin: Secondary | ICD-10-CM

## 2017-04-21 DIAGNOSIS — Z1239 Encounter for other screening for malignant neoplasm of breast: Secondary | ICD-10-CM

## 2017-04-21 DIAGNOSIS — I340 Nonrheumatic mitral (valve) insufficiency: Secondary | ICD-10-CM | POA: Diagnosis not present

## 2017-04-21 NOTE — Assessment & Plan Note (Signed)
Echocardiogram pending and is scheduled for May 2018. No murmur noted on exam.

## 2017-04-21 NOTE — Assessment & Plan Note (Signed)
A1C in March at 7.4 which is quite an increase from prior. Will have her work on her diet and start exercising. Recheck in 6 months. Continue Metformin 1000 mg BID for now. Managed on ARB. Foot exam unremarkable. Pneumonia vaccination UTD.

## 2017-04-21 NOTE — Assessment & Plan Note (Signed)
Overall stable, continue Lexapro and Wellbutrin.

## 2017-04-21 NOTE — Patient Instructions (Signed)
Call the [facility] and schedule your mammogram and bone density test.  I will notify you of the Cologuard results once received.  Start exercising. You should be getting 150 minutes of moderate intensity exercise weekly.  Increase consumption of vegetables, fruit, whole grains.  Ensure you are consuming 64 ounces of water daily.  Follow up in 6 months for re-evaluation.   It was a pleasure to see you today!

## 2017-04-21 NOTE — Assessment & Plan Note (Signed)
Poor diet and not exercising. Discussed the importance of a healthy diet and regular exercise in order for weight loss, and to reduce the risk of other medical diseases.

## 2017-04-21 NOTE — Assessment & Plan Note (Signed)
Recently underwent surgical intervention, numbness has significantly improved. Discussed that I recommend she attempt to return to work in June. Also requested she get opinions from her rheumatologist and neurosurgeon regarding when to return to work. She verbalized understanding.

## 2017-04-21 NOTE — Progress Notes (Signed)
Pre visit review using our clinic review tool, if applicable. No additional management support is needed unless otherwise documented below in the visit note. 

## 2017-04-21 NOTE — Assessment & Plan Note (Addendum)
TC and LDL at goal, trigs slightly above goal. She declines statin medication for now and would like to work on diet. ASCVD 10 year risk of 5.4%. Continue to monitor and initiate statin if no improvement in 6 months.

## 2017-04-21 NOTE — Assessment & Plan Note (Signed)
Overall stable, continue current regimen. 

## 2017-04-21 NOTE — Assessment & Plan Note (Signed)
Following with rheumatology

## 2017-04-21 NOTE — Assessment & Plan Note (Signed)
Currently following with neurosurgery. Consider pain management referral for chronic low back pain. Recommended she attempt to return to work in June as recommended per [place].

## 2017-04-21 NOTE — Assessment & Plan Note (Signed)
Immunizations UTD. Mammogram and bone density due, ordered and pending. Colonoscopy due, she elects for Cologuard for which we initiated today. Discussed the importance of a healthy diet and regular exercise in order for weight loss, and to reduce the risk of other medical diseases. Exam stable. Labs stable. Follow up annually for CPE.

## 2017-04-21 NOTE — Assessment & Plan Note (Signed)
Above goal in the office today, also on home readings. She is at the limit of nearly all of her medications.  Will increase Clonidine to 0.1 mg BID and have her monitor BP. Will check on readings in 2 weeks.

## 2017-04-21 NOTE — Progress Notes (Signed)
Subjective:    Patient ID: Lauren Winters, female    DOB: 08/13/1966, 51 y.o.   MRN: [id number]  HPI  Lauren Winters is a 51 year old female who presents today for complete physical.  Underwent surgery to lumbar spine last month for constant numbness to bilateral lower extremities. This has overall improved since surgery. She continues to experience bilateral hip and low back pain. Her long term disability will be up in June and is wondering if she will be able to go back to work. She can't get a straight answer from her rheumatologist or surgeon.   Immunizations: -Tetanus: Completed in 2017 -Influenza: Completed last season -Pneumonia: Completed in 2015   Diet: She endorses a poor diet. She's not cooking since surgery. Breakfast: Toast after surgery. Bacon/sausage, fruit before surgery. Lunch: Sandwich (peanut butter/pimento cheese) after surgery. Sandwich mean and salad before surgery. Dinner: Ovid Curd after surgery. Chicken, [place], vegetables before surgery. Snacks: Ice cream sandwiches Desserts: Daily Beverages: Coffee, diet Coke. Occasional water.   Exercise: She is not exercising. Eye exam: Completed in December 2017. Dental exam: Has not completed recently. Colonoscopy: Declines coloscopy, elects for Cologuard.  Dexa: Due. Pap Smear: Hysterectomy Mammogram: Due. Ordered.    Review of Systems  Constitutional: Negative for unexpected weight change.  HENT: Negative for rhinorrhea.   Respiratory: Negative for cough and shortness of breath.   Cardiovascular: Negative for chest pain.  Gastrointestinal: Negative for constipation and diarrhea.  Genitourinary: Negative for difficulty urinating and menstrual problem.  Musculoskeletal: Positive for back pain. Negative for arthralgias and myalgias.       Chronic back pain and hip pain. Recently underwent surgery.  Skin: Negative for rash.  Allergic/Immunologic: Negative for environmental allergies.  Neurological: Positive for  numbness. Negative for dizziness and headaches.       Overall numbness has improved  Psychiatric/Behavioral:       Stressed regarding continued hip and back pain.       Past Medical History:  Diagnosis Date  . Anxiety   . Atypical chest pain    a. ETT 05/2014: normal stress test without evidence of myocardial ischemia or chest pain at peak stress  . Depression   . Diabetes mellitus without complication ([place]) metformin   dx 2015  . Fibromyalgia   . Heart murmur    told back in 2007, but no mention with 2016 cardiac w/u  . Hypertension   . Lupus (systemic lupus erythematosus) ([place])   . Mitral regurgitation    a. 05/2014 echo: EF >55%, nl LV/RV sys fxn, mild MR/AR, no valvular stenosis   . Obesity (BMI 30-39.9)   . Raynaud's phenomenon   . Rheumatoid arthritis ([place])   . Sjogren's disease ([place])   . Tobacco abuse    a. ongoing; b. ongoing SOB     Social History   Social History  . Marital status: Married    Spouse name: N/A  . Number of children: N/A  . Years of education: N/A   Occupational History  . Not on file.   Social History Main Topics  . Smoking status: Former Smoker    Packs/day: 0.50    Years: 10.00    Types: Cigarettes    Quit date: 07/22/2015  . Smokeless tobacco: Never Used  . Alcohol use No  . Drug use: No  . Sexual activity: Not on file   Other Topics Concern  . Not on file   Social History Narrative   Married.   3 children.  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     Past Surgical History:  Procedure Laterality Date  . ABDOMINAL HYSTERECTOMY    . CESAREAN SECTION     x3  . LUMBAR LAMINECTOMY    . LUMBAR LAMINECTOMY/DECOMPRESSION MICRODISCECTOMY N/A 03/29/2017   Procedure: Re operative Right Lumbar Three-Four Laminectomy and discectomy with Left Lumbar Three-Four, Bilateral Lumbar Four-Five Laminectomy for decompression;  Surgeon: Kevan Ny Ditty, MD;  Location: [facility];  Service: Neurosurgery;   Laterality: N/A;  . TUBAL LIGATION      Family History  Problem Relation Age of Onset  . Hypertension Mother   . Valvular heart disease Mother   . CAD Father 74    CABG x 4  . Liver cancer Father 24    passed  . CAD Paternal Aunt     CABG  . CAD Paternal Aunt     CABG  . CAD Paternal Aunt     CABG  . CAD Paternal Aunt     CABG  . CAD Paternal Aunt     CABG  . CAD Paternal Uncle     MI s/p CABG  . Stroke Maternal Grandmother   . Stroke Maternal Grandfather   . CAD Maternal Grandfather     Allergies  Allergen Reactions  . Clindamycin/Lincomycin Hives  . Adhesive [Tape] Other (See Comments)    Sensitivity to certain band aids and adhesives - burns skin, makes skin raw  . Prednisone     Chest pain w/ cortisone shots or prednisone pills  . Sudafed [Pseudoephedrine Hcl] Other (See Comments)    Dizziness     Current Outpatient Prescriptions on File Prior to Visit  Medication Sig Dispense Refill  . amLODipine (NORVASC) 5 MG tablet Take 1 tablet (5 mg total) by mouth 2 (two) times daily. (Patient taking differently: Take 10 mg by mouth daily. ) 180 tablet 2  . buPROPion (WELLBUTRIN XL) 150 MG 24 hr tablet Take 1 tablet (150 mg total) by mouth daily. 30 tablet 5  . Clobetasol Prop Crea-Coal Tar (CLOBETA CREAM EX) Apply 1 application topically daily as needed (skin irritation).     . cloNIDine (CATAPRES) 0.1 MG tablet Take 1 tablet (0.1 mg total) by mouth daily. 30 tablet 1  . cyclobenzaprine (FLEXERIL) 10 MG tablet Take 1 tablet (10 mg total) by mouth 3 (three) times daily as needed for muscle spasms. 90 tablet 2  . diclofenac sodium (VOLTAREN) 1 % GEL Apply 1 application topically daily as needed.    Marland Kitchen escitalopram (LEXAPRO) 20 MG tablet Take 1 tablet (20 mg total) by mouth daily. 90 tablet 1  . folic acid (FOLVITE) 1 MG tablet Take 2 tablets (2 mg total) by mouth daily. 180 tablet 4  . gabapentin (NEURONTIN) 300 MG capsule Take 300 mg by mouth 2 (two) times daily.   0  .  Halcinonide 0.1 % OINT Apply 1 application topically 2 (two) times daily as needed.    Marland Kitchen HYDROcodone-acetaminophen (NORCO) 7.5-325 MG tablet Take 1-2 tablets by mouth every 4 (four) hours as needed for moderate pain. 60 tablet 0  . hydroxychloroquine (PLAQUENIL) 200 MG tablet Take 1 tablet (200 mg total) by mouth 2 (two) times daily. 60 tablet 0  . hydrOXYzine (ATARAX/VISTARIL) 25 MG tablet Take 1 tablet (25 mg total) by mouth 2 (two) times daily as needed for anxiety. (Patient taking differently: Take 25 mg by mouth daily. ) 30 tablet 0  . magic mouthwash w/lidocaine SOLN diphenhydramine/  2%  VISCOUS lidocaine /antacid-MAALOX (1:1:1); 15 mL swished in the mouth for 30 sec, then spit out.    May be used every 3 to 4 h 120 mL 2  . metFORMIN (GLUCOPHAGE) 1000 MG tablet Take 1 tablet (1,000 mg total) by mouth 2 (two) times daily with a meal. 180 tablet 3  . methocarbamol (ROBAXIN) 500 MG tablet TAKE 1 TABLET (500 MG TOTAL) BY MOUTH EVERY 8 (EIGHT) HOURS AS NEEDED FOR MUSCLE SPASMS.  0  . methotrexate 50 MG/2ML injection Inject 0.8 mLs (20 mg total) into the skin once a week. 9.6 mL 0  . metoprolol (LOPRESSOR) 100 MG tablet Take 1 tablet (100 mg total) by mouth 2 (two) times daily. (Patient taking differently: Take 100 mg by mouth daily. ) 60 tablet 3  . pilocarpine (SALAGEN) 5 MG tablet Take 5 mg by mouth daily as needed.     . tacrolimus (PROTOPIC) 0.1 % ointment Apply 1 application topically 2 (two) times daily as needed.    . Triamcinolone Acetonide (TRIAMCINOLONE 0.1 % CREAM : EUCERIN) CREA Apply 1 application topically 2 (two) times daily as needed for rash or irritation.     . valsartan-hydrochlorothiazide (DIOVAN-HCT) 320-25 MG tablet Take 1 tablet by mouth daily. 90 tablet 3  . Vitamin D, Ergocalciferol, (DRISDOL) 50000 units CAPS capsule Take 1 capsule (50,000 Units total) by mouth every 7 (seven) days. 12 capsule 0   No current facility-administered medications on file prior to visit.     BP  140/86   Pulse 82   Temp 97.9 F (36.6 C) (Oral)   Ht 5\' 7"  (1.702 m)   Wt 219 lb 1.9 oz (99.4 kg)   SpO2 99%   BMI 34.32 kg/m    Objective:   Physical Exam  Constitutional: She is oriented to person, place, and time. She appears well-nourished.  HENT:  Right Ear: Tympanic membrane and ear canal normal.  Left Ear: Tympanic membrane and ear canal normal.  Nose: Nose normal.  Mouth/Throat: Oropharynx is clear and moist.  Eyes: Conjunctivae and EOM are normal. Pupils are equal, round, and reactive to light.  Neck: Neck supple. No thyromegaly present.  Cardiovascular: Normal rate and regular rhythm.   No murmur heard. Pulmonary/Chest: Effort normal and breath sounds normal. She has no rales.  Abdominal: Soft. Bowel sounds are normal. There is no tenderness.  Musculoskeletal:       Right hip: She exhibits decreased range of motion.       Left hip: She exhibits decreased range of motion.       Lumbar back: She exhibits decreased range of motion and pain.  Lymphadenopathy:    She has no cervical adenopathy.  Neurological: She is alert and oriented to person, place, and time. She has normal reflexes. No cranial nerve deficit.  Skin: Skin is warm and dry. No rash noted.  Psychiatric: She has a normal mood and affect.          Assessment & Plan:

## 2017-04-28 ENCOUNTER — Ambulatory Visit: Payer: BLUE CROSS/BLUE SHIELD | Admitting: Psychology

## 2017-05-03 ENCOUNTER — Ambulatory Visit (INDEPENDENT_AMBULATORY_CARE_PROVIDER_SITE_OTHER): Payer: BLUE CROSS/BLUE SHIELD

## 2017-05-03 ENCOUNTER — Other Ambulatory Visit: Payer: Self-pay

## 2017-05-03 DIAGNOSIS — I340 Nonrheumatic mitral (valve) insufficiency: Secondary | ICD-10-CM | POA: Diagnosis not present

## 2017-05-03 DIAGNOSIS — I351 Nonrheumatic aortic (valve) insufficiency: Secondary | ICD-10-CM | POA: Diagnosis not present

## 2017-05-04 ENCOUNTER — Encounter: Payer: Self-pay | Admitting: Primary Care

## 2017-05-04 ENCOUNTER — Ambulatory Visit
Admission: RE | Admit: 2017-05-04 | Discharge: 2017-05-04 | Disposition: A | Discharge status: Home / Self Care | Payer: BLUE CROSS/BLUE SHIELD | Source: Ambulatory Visit | Attending: Primary Care | Admitting: Primary Care

## 2017-05-04 ENCOUNTER — Other Ambulatory Visit: Payer: Self-pay | Admitting: Primary Care

## 2017-05-04 DIAGNOSIS — E2839 Other primary ovarian failure: Secondary | ICD-10-CM | POA: Insufficient documentation

## 2017-05-04 DIAGNOSIS — Z1239 Encounter for other screening for malignant neoplasm of breast: Secondary | ICD-10-CM

## 2017-05-04 DIAGNOSIS — Z1231 Encounter for screening mammogram for malignant neoplasm of breast: Secondary | ICD-10-CM | POA: Diagnosis not present

## 2017-05-05 ENCOUNTER — Encounter: Payer: Self-pay | Admitting: Primary Care

## 2017-05-05 ENCOUNTER — Other Ambulatory Visit: Payer: Self-pay | Admitting: Primary Care

## 2017-05-05 ENCOUNTER — Ambulatory Visit (INDEPENDENT_AMBULATORY_CARE_PROVIDER_SITE_OTHER): Payer: BLUE CROSS/BLUE SHIELD | Admitting: Psychology

## 2017-05-05 DIAGNOSIS — I10 Essential (primary) hypertension: Secondary | ICD-10-CM

## 2017-05-05 DIAGNOSIS — F410 Panic disorder [episodic paroxysmal anxiety] without agoraphobia: Secondary | ICD-10-CM | POA: Diagnosis not present

## 2017-05-05 DIAGNOSIS — F331 Major depressive disorder, recurrent, moderate: Secondary | ICD-10-CM

## 2017-05-05 MED ORDER — CLONIDINE HCL 0.1 MG PO TABS: 0.1000 mg | ORAL_TABLET | Freq: Two times a day (BID) | ORAL | 1 refills | Status: DC

## 2017-05-11 ENCOUNTER — Ambulatory Visit (INDEPENDENT_AMBULATORY_CARE_PROVIDER_SITE_OTHER): Payer: BLUE CROSS/BLUE SHIELD | Admitting: Neurology

## 2017-05-11 ENCOUNTER — Encounter: Payer: Self-pay | Admitting: Neurology

## 2017-05-11 ENCOUNTER — Encounter: Payer: Self-pay | Admitting: Primary Care

## 2017-05-11 ENCOUNTER — Other Ambulatory Visit: Payer: Self-pay | Admitting: Primary Care

## 2017-05-11 ENCOUNTER — Ambulatory Visit: Payer: Self-pay | Admitting: Neurology

## 2017-05-11 VITALS — BP 148/72 | HR 107 | Ht 67.0 in | Wt 220.0 lb

## 2017-05-11 DIAGNOSIS — R202 Paresthesia of skin: Secondary | ICD-10-CM | POA: Diagnosis not present

## 2017-05-11 DIAGNOSIS — F329 Major depressive disorder, single episode, unspecified: Secondary | ICD-10-CM

## 2017-05-11 DIAGNOSIS — F32A Depression, unspecified: Secondary | ICD-10-CM

## 2017-05-11 DIAGNOSIS — F419 Anxiety disorder, unspecified: Principal | ICD-10-CM

## 2017-05-11 MED ORDER — ESCITALOPRAM OXALATE 20 MG PO TABS: 20.0000 mg | ORAL_TABLET | Freq: Every day | ORAL | 2 refills | Status: DC

## 2017-05-11 NOTE — Patient Instructions (Signed)
   We will check EMG and NCV study to look at the nerve function of the arms and legs.

## 2017-05-11 NOTE — Progress Notes (Signed)
Reason for visit: Right hand numbness  Referring physician: Dr. Pedro Earls is a 51 y.o. female  History of present illness:  Lauren Winters is a 51 year old right-handed white female with a history of chronic low back pain with to prior lumbosacral spine surgeries, Lauren most recent one was done within Lauren last month. Lauren Winters has issues with discomfort and numbness and tingling in Lauren feet that has gradually worked up to Lauren knees bilaterally, and she also has some thigh discomfort as well. Lauren Winters has started having some numbness and discomfort in Lauren right hand that began in December 2017. She reports some difficulty with picking up small objects with Lauren right hand because of Lauren numbness. Lauren Winters does have some right shoulder discomfort as well at times, she denies any significant neck pain. She is walking with a cane following surgery, she reports some balance issues, she does have some urinary incontinence and urgency of Lauren bladder. Lauren Winters has had occasional falls at times. She has had nerve conduction studies done on Lauren legs in 2016 at [facility], she was told that she needed back surgery at that time. Lauren Winters has a history of lupus, she is followed through rheumatology. She recently had MRI evaluation of Lauren cervical spine done on 03/15/2017 which showed evidence of a moderate level spinal stenosis at Lauren C3-5-6 level. She is sent to this office for an evaluation.  Past Medical History:  Diagnosis Date  . Anxiety   . Atypical chest pain    a. ETT 05/2014: normal stress test without evidence of myocardial ischemia or chest pain at peak stress  . Depression   . Diabetes mellitus without complication ([place]) metformin   dx 2015  . Fibromyalgia   . Heart murmur    told back in 2007, but no mention with 2016 cardiac w/u  . Hypertension   . Lupus (systemic lupus erythematosus) ([place])   . Mitral regurgitation    a. 05/2014 echo: EF >55%, nl  LV/RV sys fxn, mild MR/AR, no valvular stenosis   . Obesity (BMI 30-39.9)   . Raynaud's phenomenon   . Rheumatoid arthritis ([place])   . Sjogren's disease ([place])   . Tobacco abuse    a. ongoing; b. ongoing SOB    Past Surgical History:  Procedure Laterality Date  . ABDOMINAL HYSTERECTOMY    . CESAREAN SECTION     x3  . LUMBAR LAMINECTOMY    . LUMBAR LAMINECTOMY/DECOMPRESSION MICRODISCECTOMY N/A 03/29/2017   Procedure: Re operative Right Lumbar Three-Four Laminectomy and discectomy with Left Lumbar Three-Four, Bilateral Lumbar Four-Five Laminectomy for decompression;  Surgeon: Kevan Ny Ditty, MD;  Location: [facility];  Service: Neurosurgery;  Laterality: N/A;  . TUBAL LIGATION      Family History  Problem Relation Age of Onset  . Hypertension Mother   . Valvular heart disease Mother   . CAD Father 24       CABG x 4  . Liver cancer Father 3       passed  . CAD Paternal Aunt        CABG  . Breast cancer Paternal Aunt   . CAD Paternal Aunt        CABG  . Breast cancer Paternal Aunt   . CAD Paternal Aunt        CABG  . Breast cancer Paternal Aunt   . CAD Paternal Aunt        CABG  . Breast  cancer Paternal Aunt   . CAD Paternal Aunt        CABG  . Breast cancer Paternal Aunt   . CAD Paternal Uncle        MI s/p CABG  . Stroke Maternal Grandmother   . Stroke Maternal Grandfather   . CAD Maternal Grandfather     Social history:  reports that she quit smoking about 21 months ago. Her smoking use included Cigarettes. She has a 5.00 pack-year smoking history. She has never used smokeless tobacco. She reports that she does not drink alcohol or use drugs.  Medications:  Prior to Admission medications   Medication Sig Start Date End Date Taking? Authorizing Provider  amLODipine (NORVASC) 5 MG tablet Take 1 tablet (5 mg total) by mouth 2 (two) times daily. Winters taking differently: Take 10 mg by mouth daily.  12/25/15  Yes Pleas Koch, NP  buPROPion (WELLBUTRIN XL) 150  MG 24 hr tablet Take 1 tablet (150 mg total) by mouth daily. 09/28/16  Yes Pleas Koch, NP  Clobetasol Prop Crea-Coal Tar (CLOBETA CREAM EX) Apply 1 application topically daily as needed (skin irritation).    Yes [provider]  cloNIDine (CATAPRES) 0.1 MG tablet Take 1 tablet (0.1 mg total) by mouth 2 (two) times daily. 05/05/17  Yes Pleas Koch, NP  cyclobenzaprine (FLEXERIL) 10 MG tablet Take 1 tablet (10 mg total) by mouth 3 (three) times daily as needed for muscle spasms. 03/30/17  Yes Costella, Vista Mink, PA-C  diclofenac sodium (VOLTAREN) 1 % GEL Apply 1 application topically daily as needed.   Yes [provider]  escitalopram (LEXAPRO) 20 MG tablet Take 1 tablet (20 mg total) by mouth daily. 05/05/16  Yes Pleas Koch, NP  folic acid (FOLVITE) 1 MG tablet Take 2 tablets (2 mg total) by mouth daily. 04/20/17 07/14/18 Yes Panwala, Naitik, PA-C  gabapentin (NEURONTIN) 300 MG capsule Take 300 mg by mouth 2 (two) times daily.  11/02/16  Yes [provider]  Halcinonide 0.1 % OINT Apply 1 application topically 2 (two) times daily as needed.   Yes [provider]  HYDROcodone-acetaminophen (NORCO) 7.5-325 MG tablet Take 1-2 tablets by mouth every 4 (four) hours as needed for moderate pain. 03/30/17  Yes Costella, Vista Mink, PA-C  hydroxychloroquine (PLAQUENIL) 200 MG tablet Take 1 tablet (200 mg total) by mouth 2 (two) times daily. 04/20/17  Yes Panwala, Naitik, PA-C  hydrOXYzine (ATARAX/VISTARIL) 25 MG tablet Take 1 tablet (25 mg total) by mouth 2 (two) times daily as needed for anxiety. Winters taking differently: Take 25 mg by mouth daily.  07/28/16  Yes Pleas Koch, NP  magic mouthwash w/lidocaine SOLN diphenhydramine/ 2%  VISCOUS lidocaine /antacid-MAALOX (1:1:1); 15 mL swished in Lauren mouth for 30 sec, then spit out.    May be used every 3 to 4 h 04/20/17  Yes Panwala, Naitik, PA-C  metFORMIN (GLUCOPHAGE) 1000 MG tablet Take 1 tablet (1,000 mg  total) by mouth 2 (two) times daily with a meal. 03/17/16  Yes Pleas Koch, NP  methocarbamol (ROBAXIN) 500 MG tablet TAKE 1 TABLET (500 MG TOTAL) BY MOUTH EVERY 8 (EIGHT) HOURS AS NEEDED FOR MUSCLE SPASMS. 02/18/17  Yes [provider]  methotrexate 50 MG/2ML injection Inject 0.8 mLs (20 mg total) into Lauren skin once a week. 04/20/17 07/19/17 Yes Panwala, Naitik, PA-C  metoprolol (LOPRESSOR) 100 MG tablet Take 1 tablet (100 mg total) by mouth 2 (two) times daily. Winters taking differently: Take  100 mg by mouth daily.  12/17/15  Yes Pleas Koch, NP  pilocarpine (SALAGEN) 5 MG tablet Take 5 mg by mouth daily as needed.    Yes [provider]  tacrolimus (PROTOPIC) 0.1 % ointment Apply 1 application topically 2 (two) times daily as needed.   Yes [provider]  Triamcinolone Acetonide (TRIAMCINOLONE 0.1 % CREAM : EUCERIN) CREA Apply 1 application topically 2 (two) times daily as needed for rash or irritation.    Yes [provider]  valsartan-hydrochlorothiazide (DIOVAN-HCT) 320-25 MG tablet Take 1 tablet by mouth daily. 10/05/16  Yes Pleas Koch, NP  Vitamin D, Ergocalciferol, (DRISDOL) 50000 units CAPS capsule Take 1 capsule (50,000 Units total) by mouth every 7 (seven) days. 04/18/17   Pleas Koch, NP      Allergies  Allergen Reactions  . Clindamycin/Lincomycin Hives  . Adhesive [Tape] Other (See Comments)    Sensitivity to certain band aids and adhesives - burns skin, makes skin raw  . Prednisone     Chest pain w/ cortisone shots or prednisone pills  . Sudafed [Pseudoephedrine Hcl] Other (See Comments)    Dizziness     ROS:  Out of a complete 14 system review of symptoms, Lauren Winters complains only of Lauren following symptoms, and all other reviewed systems are negative.  Fatigue Heart murmur Skin rash, itching Blurred vision, eye pain Shortness of breath, cough Diarrhea Urination problems, incontinence Memory loss,  headache, numbness, weakness Depression, anxiety, decreased energy, and disinterest in activities Insomnia, restless legs  Blood pressure (!) 148/72, pulse (!) 107, height 5\' 7"  (1.702 m), weight 220 lb (99.8 kg).  Physical Exam  General: Lauren Winters is alert and cooperative at Lauren time of Lauren examination. Lauren Winters is moderately obese.  Eyes: Pupils are equal, round, and reactive to light. Discs are flat bilaterally.  Neck: Lauren neck is supple, no carotid bruits are noted.  Respiratory: Lauren respiratory examination is clear.  Cardiovascular: Lauren cardiovascular examination reveals a regular rate and rhythm, no obvious murmurs or rubs are noted.  Skin: Extremities are without significant edema.  Neurologic Exam  Mental status: Lauren Winters is alert and oriented x 3 at Lauren time of Lauren examination. Lauren Winters has apparent normal recent and remote memory, with an apparently normal attention span and concentration ability.  Cranial nerves: Facial symmetry is present. There is good sensation of Lauren face to pinprick and soft touch bilaterally. Lauren strength of Lauren facial muscles and Lauren muscles to head turning and shoulder shrug are normal bilaterally. Speech is well enunciated, no aphasia or dysarthria is noted. Extraocular movements are full. Visual fields are full. Lauren tongue is midline, and Lauren Winters has symmetric elevation of Lauren soft palate. No obvious hearing deficits are noted.  Motor: Lauren motor testing reveals 5 over 5 strength of all 4 extremities. Good symmetric motor tone is noted throughout.  Sensory: Sensory testing is intact to pinprick, soft touch, vibration sensation, and position sense on all 4 extremities. No definite stocking pattern pinprick sensory deficit was seen in Lauren lower extremities. No evidence of extinction is noted.  Coordination: Cerebellar testing reveals good finger-nose-finger and heel-to-shin bilaterally. Tinel's sign at Lauren wrists are negative  bilaterally.  Gait and station: Gait is slightly wide-based, Lauren Winters uses a cane for ambulation. Tandem gait is unsteady. Romberg is negative. No drift is seen.  Reflexes: Deep tendon reflexes are symmetric and normal bilaterally, with Lauren exception that Lauren ankle jerk reflexes are depressed bilaterally. Toes  are downgoing bilaterally.   Assessment/Plan:  1. Chronic low back pain, lower extremity discomfort  2. Right hand discomfort and numbness  Lauren Winters will undergo nerve conduction studies of both legs and Lauren right arm, and EMG of Lauren right arm. Lauren Winters does have diabetes and could potentially have a peripheral neuropathy. If a neuropathy is present, further blood work evaluation will be done. Lauren Winters has a history of lupus.   Jill Alexanders MD 05/11/2017 3:43 PM  [facility] [address] [place], [place] [address]  Phone [phone] Fax [phone]

## 2017-05-12 ENCOUNTER — Ambulatory Visit (INDEPENDENT_AMBULATORY_CARE_PROVIDER_SITE_OTHER): Payer: BLUE CROSS/BLUE SHIELD | Admitting: Psychology

## 2017-05-12 DIAGNOSIS — F411 Generalized anxiety disorder: Secondary | ICD-10-CM

## 2017-05-12 DIAGNOSIS — F331 Major depressive disorder, recurrent, moderate: Secondary | ICD-10-CM

## 2017-05-13 ENCOUNTER — Encounter: Payer: Self-pay | Admitting: Primary Care

## 2017-05-16 ENCOUNTER — Encounter: Payer: Self-pay | Admitting: Primary Care

## 2017-05-18 ENCOUNTER — Telehealth (INDEPENDENT_AMBULATORY_CARE_PROVIDER_SITE_OTHER): Payer: Self-pay

## 2017-05-18 NOTE — Telephone Encounter (Signed)
-----   Message from [facility] May, RT sent at 05/13/2017  4:09 PM EDT ----- Regarding: FW: Apply for Visco supplementation both knees. Hyalgan preferred but Euflexxa or Orthovisc are acceptable. See notes, try Hyalgan first.  Please fax in, thanks.   ----- Message ----- From: Shona Needles, RT Sent: 04/28/2017   4:43 PM To: Wendy May, RT Subject: FW: Apply for Visco supplementation both kne#    ----- Message ----- From: Eliezer Lofts, PA-C Sent: 04/20/2017  12:31 PM To: Shona Needles, RT Subject: Apply for Visco supplementation both knees. #  Apply for Visco supplementation both knees. Hyalgan preferred but Euflexxa or Orthovisc are acceptable.

## 2017-05-18 NOTE — Telephone Encounter (Signed)
Faxed Hyalgan application to [phone]

## 2017-05-19 ENCOUNTER — Ambulatory Visit: Payer: BLUE CROSS/BLUE SHIELD | Admitting: Psychology

## 2017-05-24 ENCOUNTER — Encounter: Payer: Self-pay | Admitting: Primary Care

## 2017-05-26 ENCOUNTER — Encounter: Payer: Self-pay | Admitting: Primary Care

## 2017-05-26 ENCOUNTER — Ambulatory Visit (INDEPENDENT_AMBULATORY_CARE_PROVIDER_SITE_OTHER): Payer: BLUE CROSS/BLUE SHIELD | Admitting: Psychology

## 2017-05-26 DIAGNOSIS — F410 Panic disorder [episodic paroxysmal anxiety] without agoraphobia: Secondary | ICD-10-CM | POA: Diagnosis not present

## 2017-05-26 DIAGNOSIS — F331 Major depressive disorder, recurrent, moderate: Secondary | ICD-10-CM

## 2017-06-01 ENCOUNTER — Ambulatory Visit (INDEPENDENT_AMBULATORY_CARE_PROVIDER_SITE_OTHER): Payer: Self-pay | Admitting: Neurology

## 2017-06-01 ENCOUNTER — Encounter: Payer: Self-pay | Admitting: Neurology

## 2017-06-01 ENCOUNTER — Ambulatory Visit (INDEPENDENT_AMBULATORY_CARE_PROVIDER_SITE_OTHER): Payer: BLUE CROSS/BLUE SHIELD | Admitting: Neurology

## 2017-06-01 DIAGNOSIS — M3500 Sicca syndrome, unspecified: Secondary | ICD-10-CM

## 2017-06-01 DIAGNOSIS — R202 Paresthesia of skin: Secondary | ICD-10-CM

## 2017-06-01 NOTE — Progress Notes (Signed)
The patient comes in today for EMG and nerve conduction study evaluation. Nerve conduction studies do not show clear evidence of a peripheral neuropathy, EMG of the right arm was normal.  The patient still having numbness in both feet and in the right hand. We'll check blood work today and follow-up in about 4 months. The etiology of the sensory changes is not clear.  The patient will be undergoing some physical therapy following her low back surgery in April 2018.

## 2017-06-01 NOTE — Procedures (Signed)
     HISTORY:  Lauren Winters is a 51 year old patient with a history of diabetes and lupus who has had lumbosacral spine surgery done in April 2018. The patient has a history of numbness in both feet, she also has numbness and some discomfort in the right hand. The patient is being evaluated for a possible neuropathy or a radiculopathy.  NERVE CONDUCTION STUDIES:  Nerve conduction studies were performed on the right upper extremity. The distal motor latencies and motor amplitudes for the median and ulnar nerves were within normal limits. The nerve conduction velocities for these nerves were also normal. The sensory latencies for the median and ulnar nerves were normal. The F wave latency for the right ulnar nerve is normal.  Nerve conduction studies were performed on both lower extremities. The distal motor latencies and motor amplitudes for the peroneal and posterior tibial nerves were within normal limits. The nerve conduction velocities for these nerves were also normal, with exception that there is minimal slowing seen for the left posterior tibial nerve. The H reflex latencies were normal. The sensory latencies for the peroneal and sural nerves were within normal limits. The F wave latencies for the posterior tibial nerves were within normal limits bilaterally.   EMG STUDIES:  EMG study was performed on the right upper extremity:  The first dorsal interosseous muscle reveals 2 to 4 K units with full recruitment. No fibrillations or positive waves were noted. The abductor pollicis brevis muscle reveals 2 to 4 K units with full recruitment. No fibrillations or positive waves were noted. The extensor indicis proprius muscle reveals 1 to 3 K units with full recruitment. No fibrillations or positive waves were noted. The pronator teres muscle reveals 2 to 3 K units with full recruitment. No fibrillations or positive waves were noted. The biceps muscle reveals 1 to 2 K units with full  recruitment. No fibrillations or positive waves were noted. The triceps muscle reveals 2 to 4 K units with full recruitment. No fibrillations or positive waves were noted. The anterior deltoid muscle reveals 2 to 3 K units with full recruitment. No fibrillations or positive waves were noted. The cervical paraspinal muscles were tested at 2 levels. No abnormalities of insertional activity were seen at either level tested. There was good relaxation.   IMPRESSION:  Nerve conduction studies done on the right upper extremity and both lower extremities shows no clear evidence of a peripheral neuropathy. EMG evaluation of the right upper extremity is unremarkable without evidence of an overlying cervical radiculopathy.  Jill Alexanders MD 06/01/2017 1:40 PM  [facility] [address] [place], [place] [address]  Phone [phone] Fax [phone]

## 2017-06-01 NOTE — Progress Notes (Signed)
Please refer to EMG and nerve conduction study procedure note. 

## 2017-06-02 ENCOUNTER — Ambulatory Visit (INDEPENDENT_AMBULATORY_CARE_PROVIDER_SITE_OTHER): Payer: BLUE CROSS/BLUE SHIELD | Admitting: Psychology

## 2017-06-02 ENCOUNTER — Encounter: Payer: Self-pay | Admitting: Primary Care

## 2017-06-02 ENCOUNTER — Telehealth (INDEPENDENT_AMBULATORY_CARE_PROVIDER_SITE_OTHER): Payer: Self-pay | Admitting: Radiology

## 2017-06-02 ENCOUNTER — Encounter: Payer: Self-pay | Admitting: Rheumatology

## 2017-06-02 DIAGNOSIS — F410 Panic disorder [episodic paroxysmal anxiety] without agoraphobia: Secondary | ICD-10-CM

## 2017-06-02 DIAGNOSIS — F331 Major depressive disorder, recurrent, moderate: Secondary | ICD-10-CM | POA: Diagnosis not present

## 2017-06-02 LAB — B. BURGDORFI ANTIBODIES: Lyme IgG/IgM Ab: <0.91 {ISR} (ref 0.00–0.90)

## 2017-06-02 LAB — ANGIOTENSIN CONVERTING ENZYME: ANGIO CONVERT ENZYME: 74 U/L (ref 14–82)

## 2017-06-02 LAB — RPR: RPR: NONREACTIVE

## 2017-06-02 LAB — HIV ANTIBODY (ROUTINE TESTING W REFLEX): HIV Screen 4th Generation wRfx: NONREACTIVE

## 2017-06-02 LAB — VITAMIN B12: VITAMIN B 12: 363 pg/mL (ref 232–1245)

## 2017-06-02 NOTE — Telephone Encounter (Signed)
At this point I would call [employer] and tell them we did not receive the VOB, and have them refax it to [place].

## 2017-06-02 NOTE — Telephone Encounter (Signed)
I tried calling this number, but it is a fax number.  I haven't received any information for this patient since faxing in application.

## 2017-06-02 NOTE — Telephone Encounter (Signed)
Dawn is calling to check status on PA for Hyalgan for this patient. Please call her.  [phone], option 2.  You can speak with anyone who answers about this. Thanks.

## 2017-06-02 NOTE — Telephone Encounter (Signed)
Talked with [employer] and advised them that we had not received VOB.  VOB of will be refaxed.

## 2017-06-04 ENCOUNTER — Encounter: Payer: Self-pay | Admitting: Primary Care

## 2017-06-06 ENCOUNTER — Telehealth: Payer: Self-pay | Admitting: Primary Care

## 2017-06-06 NOTE — Telephone Encounter (Signed)
[employer] faxed disability forms In [name] BOX

## 2017-06-07 ENCOUNTER — Ambulatory Visit (INDEPENDENT_AMBULATORY_CARE_PROVIDER_SITE_OTHER): Payer: BLUE CROSS/BLUE SHIELD | Admitting: Psychology

## 2017-06-07 DIAGNOSIS — F331 Major depressive disorder, recurrent, moderate: Secondary | ICD-10-CM

## 2017-06-07 DIAGNOSIS — F410 Panic disorder [episodic paroxysmal anxiety] without agoraphobia: Secondary | ICD-10-CM

## 2017-06-08 NOTE — Telephone Encounter (Signed)
Completed and placed on [place] desk. 

## 2017-06-09 DIAGNOSIS — M0579 Rheumatoid arthritis with rheumatoid factor of multiple sites without organ or systems involvement: Secondary | ICD-10-CM | POA: Insufficient documentation

## 2017-06-09 DIAGNOSIS — M961 Postlaminectomy syndrome, not elsewhere classified: Secondary | ICD-10-CM | POA: Insufficient documentation

## 2017-06-09 DIAGNOSIS — M7060 Trochanteric bursitis, unspecified hip: Secondary | ICD-10-CM | POA: Insufficient documentation

## 2017-06-09 NOTE — Telephone Encounter (Signed)
Paperwork faxed °

## 2017-06-10 ENCOUNTER — Encounter: Payer: Self-pay | Admitting: Primary Care

## 2017-06-16 ENCOUNTER — Ambulatory Visit (INDEPENDENT_AMBULATORY_CARE_PROVIDER_SITE_OTHER): Payer: BLUE CROSS/BLUE SHIELD | Admitting: Psychology

## 2017-06-16 DIAGNOSIS — F410 Panic disorder [episodic paroxysmal anxiety] without agoraphobia: Secondary | ICD-10-CM | POA: Diagnosis not present

## 2017-06-16 DIAGNOSIS — F331 Major depressive disorder, recurrent, moderate: Secondary | ICD-10-CM

## 2017-06-23 ENCOUNTER — Ambulatory Visit: Payer: BLUE CROSS/BLUE SHIELD | Admitting: Psychology

## 2017-06-23 ENCOUNTER — Encounter: Payer: Self-pay | Admitting: Primary Care

## 2017-06-24 NOTE — Telephone Encounter (Signed)
Talked with [employer] concerning PA for Hyalgan.  PA form will be faxed today 06/24/17.

## 2017-06-28 ENCOUNTER — Ambulatory Visit (INDEPENDENT_AMBULATORY_CARE_PROVIDER_SITE_OTHER): Payer: BLUE CROSS/BLUE SHIELD | Admitting: Psychology

## 2017-06-28 DIAGNOSIS — F411 Generalized anxiety disorder: Secondary | ICD-10-CM | POA: Diagnosis not present

## 2017-06-28 DIAGNOSIS — F331 Major depressive disorder, recurrent, moderate: Secondary | ICD-10-CM | POA: Diagnosis not present

## 2017-06-28 NOTE — Telephone Encounter (Signed)
Faxed [employer] PA form to [phone]

## 2017-06-30 ENCOUNTER — Ambulatory Visit: Payer: BLUE CROSS/BLUE SHIELD | Admitting: Psychology

## 2017-07-14 ENCOUNTER — Encounter: Payer: Self-pay | Admitting: Primary Care

## 2017-07-18 ENCOUNTER — Ambulatory Visit (INDEPENDENT_AMBULATORY_CARE_PROVIDER_SITE_OTHER): Payer: BLUE CROSS/BLUE SHIELD | Admitting: Rheumatology

## 2017-07-18 ENCOUNTER — Encounter: Payer: Self-pay | Admitting: Rheumatology

## 2017-07-18 VITALS — BP 216/99 | HR 70 | Ht 66.0 in | Wt 227.0 lb

## 2017-07-18 DIAGNOSIS — Z79899 Other long term (current) drug therapy: Secondary | ICD-10-CM | POA: Diagnosis not present

## 2017-07-18 DIAGNOSIS — R768 Other specified abnormal immunological findings in serum: Secondary | ICD-10-CM | POA: Diagnosis not present

## 2017-07-18 DIAGNOSIS — M797 Fibromyalgia: Secondary | ICD-10-CM | POA: Diagnosis not present

## 2017-07-18 DIAGNOSIS — M3219 Other organ or system involvement in systemic lupus erythematosus: Secondary | ICD-10-CM | POA: Diagnosis not present

## 2017-07-18 DIAGNOSIS — M3500 Sicca syndrome, unspecified: Secondary | ICD-10-CM

## 2017-07-18 LAB — CBC WITH DIFFERENTIAL/PLATELET
BASOS ABS: 0 {cells}/uL (ref 0–200)
Basophils Relative: 0 %
Eosinophils Absolute: 110 cells/uL (ref 15–500)
Eosinophils Relative: 2 %
HEMATOCRIT: 39.8 % (ref 35.0–45.0)
HEMOGLOBIN: 13.4 g/dL (ref 11.7–15.5)
LYMPHS ABS: 1320 {cells}/uL (ref 850–3900)
Lymphocytes Relative: 24 %
MCH: 27.5 pg (ref 27.0–33.0)
MCHC: 33.7 g/dL (ref 32.0–36.0)
MCV: 81.6 fL (ref 80.0–100.0)
MONO ABS: 440 {cells}/uL (ref 200–950)
MPV: 9.2 fL (ref 7.5–12.5)
Monocytes Relative: 8 %
NEUTROS PCT: 66 %
Neutro Abs: 3630 cells/uL (ref 1500–7800)
Platelets: 318 10*3/uL (ref 140–400)
RBC: 4.88 MIL/uL (ref 3.80–5.10)
RDW: 14 % (ref 11.0–15.0)
WBC: 5.5 10*3/uL (ref 3.8–10.8)

## 2017-07-18 MED ORDER — DOXYCYCLINE HYCLATE 100 MG PO CAPS: 100.0000 mg | ORAL_CAPSULE | Freq: Two times a day (BID) | ORAL | 0 refills | Status: AC

## 2017-07-18 NOTE — Progress Notes (Signed)
Office Visit Note  Patient: Lauren Winters             Date of Birth: November 04, 1966           MRN: [id number]             PCP: Lauren Koch, NP Referring: Lauren Koch, NP Visit Date: 07/18/2017 Occupation: _0 @    Subjective:  Follow-up (Break outs.  hand, feet, and knee pain. using cane. wants medication refil. )  History of Present Illness: Lauren Winters is a 51 y.o. female  Who presents for follow-up on lupus, high-risk prescription (methotrexate 0.8 ML ,folic acid 2 milligrams daily, Plaquenil 200 twice a day).  At the last visit on 04/20/2017, patient reported that she was having more oral and nasal ulcers. She also complained of arthralgia to bilateral hands, ongoing fatigue, discuss possible use of Benlysta in the future if patient continues to have problems controlling her lupus with current treatment plan.  Today, pt states her fms she is flaring. Right hand throbs at mcp and left hand throbs at mcp and pip and left 2nd finger. Both elbows, trapezius, stiff since waking up till about noon.  Nose w/ lots of sores, mouth w/ ulcers. Patient is requesting a refill on medication given to her by her skin doctor, Dr. Maurene Capes M.D. The prescription she brings in is dated 09/13/2013 and it is triamcinolone 0.1% (generic for Kenalog/Orabase 0.1%). Patient states that she applies a small amount to her oral ulcers and makes it significant difference for her.  Activities of Daily Living:  Patient reports morning stiffness for 6 minutes.   Patient Reports nocturnal pain.  Difficulty dressing/grooming: Reports Difficulty climbing stairs: Reports Difficulty getting out of chair: Reports Difficulty using hands for taps, buttons, cutlery, and/or writing: Reports   Review of Systems  Constitutional: Negative for fatigue.  HENT: Negative for mouth sores and mouth dryness.   Eyes: Negative for dryness.  Respiratory: Negative for shortness of breath.     Gastrointestinal: Negative for constipation and diarrhea.  Musculoskeletal: Negative for myalgias and myalgias.  Skin: Negative for sensitivity to sunlight.  Psychiatric/Behavioral: Negative for decreased concentration and sleep disturbance.    PMFS History:  Patient Active Problem List   Diagnosis Date Noted  . Paresthesia 05/11/2017  . HNP (herniated nucleus pulposus), lumbar 03/29/2017  . Chronic right shoulder pain 02/18/2017  . Primary osteoarthritis of both hands 12/22/2016  . Primary osteoarthritis of both knees 12/22/2016  . Spondylosis of lumbar region without myelopathy or radiculopathy 12/22/2016  . Fibromyalgia 12/22/2016  . Recurrent oral ulcers 12/22/2016  . Preventative health care 04/19/2016  . Chronic back pain 12/17/2015  . Hyperlipidemia 08/29/2015  . Lupus (systemic lupus erythematosus) ([place]) 08/04/2015  . Rheumatoid factor positive 08/04/2015  . Sjogren's syndrome ([place]) 08/04/2015  . Mitral regurgitation   . Tobacco abuse   . Atypical chest pain   . Obesity (BMI 30-39.9)   . Anxiety   . Hypertension   . Diabetes mellitus without complication ([place])   . Clinical depression 05/23/2014    Past Medical History:  Diagnosis Date  . Anxiety   . Atypical chest pain    a. ETT 05/2014: normal stress test without evidence of myocardial ischemia or chest pain at peak stress  . Depression   . Diabetes mellitus without complication ([place]) metformin   dx 2015  . Fibromyalgia   . Heart murmur    told back in 2007, but no mention with 2016 cardiac  w/u  . Hypertension   . Lupus (systemic lupus erythematosus) ([place])   . Mitral regurgitation    a. 05/2014 echo: EF >55%, nl LV/RV sys fxn, mild MR/AR, no valvular stenosis   . Obesity (BMI 30-39.9)   . Raynaud's phenomenon   . Rheumatoid arthritis ([place])   . Sjogren's disease ([place])   . Tobacco abuse    a. ongoing; b. ongoing SOB    Family History  Problem Relation Age of Onset  . Hypertension Mother   . Valvular  heart disease Mother   . CAD Father 2       CABG x 4  . Liver cancer Father 37       passed  . CAD Paternal Aunt        CABG  . Breast cancer Paternal Aunt   . CAD Paternal Aunt        CABG  . Breast cancer Paternal Aunt   . CAD Paternal Aunt        CABG  . Breast cancer Paternal Aunt   . CAD Paternal Aunt        CABG  . Breast cancer Paternal Aunt   . CAD Paternal Aunt        CABG  . Breast cancer Paternal Aunt   . CAD Paternal Uncle        MI s/p CABG  . Stroke Maternal Grandmother   . Stroke Maternal Grandfather   . CAD Maternal Grandfather    Past Surgical History:  Procedure Laterality Date  . ABDOMINAL HYSTERECTOMY    . CESAREAN SECTION     x3  . LUMBAR LAMINECTOMY    . LUMBAR LAMINECTOMY/DECOMPRESSION MICRODISCECTOMY N/A 03/29/2017   Procedure: Re operative Right Lumbar Three-Four Laminectomy and discectomy with Left Lumbar Three-Four, Bilateral Lumbar Four-Five Laminectomy for decompression;  Surgeon: Kevan Ny Ditty, MD;  Location: [facility];  Service: Neurosurgery;  Laterality: N/A;  . TUBAL LIGATION     Social History   Social History Narrative   Married.   3 children.   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      Objective: Vital Signs: BP (!) 216/99 (BP Location: Left Arm, Patient Position: Sitting, Cuff Size: Normal)   Pulse 70   Ht _0  (1.676 m)   Wt 227 lb (103 kg)   BMI 36.64 kg/m    Physical Exam  Constitutional: She is oriented to person, place, and time. She appears well-developed and well-nourished.  HENT:  Head: Normocephalic and atraumatic.  Eyes: Pupils are equal, round, and reactive to light. EOM are normal.  Cardiovascular: Normal rate, regular rhythm and normal heart sounds.  Exam reveals no gallop and no friction rub.   No murmur heard. Pulmonary/Chest: Effort normal and breath sounds normal. She has no wheezes. She has no rales.  Abdominal: Soft. Bowel sounds are normal. She exhibits no  distension. There is no tenderness. There is no guarding. No hernia.  Musculoskeletal: Normal range of motion. She exhibits no edema, tenderness or deformity.  Lymphadenopathy:    She has no cervical adenopathy.  Neurological: She is alert and oriented to person, place, and time. Coordination normal.  Skin: Skin is warm and dry. Capillary refill takes less than 2 seconds. No rash noted.  Psychiatric: She has a normal mood and affect. Her behavior is normal.  Nursing note and vitals reviewed.    Musculoskeletal Exam:  Full range of motion of all joints Grip strength is equal and strong  bilaterally Fibromyalgia tender points are absent  CDAI Exam: CDAI Homunculus Exam:   Joint Counts:  CDAI Tender Joint count: 0 CDAI Swollen Joint count: 0  Global Assessments:  Patient Global Assessment: 8 Provider Global Assessment: 8  CDAI Calculated Score: 16    Investigation: No additional findings. Procedure visit on 06/01/2017  Component Date Value Ref Range Status  . Vitamin B-12 06/01/2017 363  232 - 1,245 pg/mL Final  . HIV Screen 4th Generation wRfx 06/01/2017 Non Reactive  Non Reactive Final  . RPR Ser Ql 06/01/2017 Non Reactive  Non Reactive Final  . Angio Convert Enzyme 06/01/2017 74  14 - 82 U/L Final  . Lyme IgG/IgM Ab 06/01/2017 <0.91  0.00 - 0.90 ISR Final   Comment:                                 Negative         <0.91                                 Equivocal  0.91 - 1.09                                 Positive         >1.09   Office Visit on 04/21/2017  Component Date Value Ref Range Status  . HM Diabetic Eye Exam 11/26/2016 No Retinopathy  No Retinopathy Final  Lab on 04/18/2017  Component Date Value Ref Range Status  . Cholesterol 04/18/2017 169  0 - 200 mg/dL Final   ATP III Classification       Desirable:  < 200 mg/dL               Borderline High:  200 - 239 mg/dL          High:  > = 240 mg/dL  . Triglycerides 04/18/2017 182.0* 0.0 - 149.0 mg/dL Final    Normal:  <150 mg/dLBorderline High:  150 - 199 mg/dL  . HDL 04/18/2017 43.10  >39.00 mg/dL Final  . VLDL 04/18/2017 36.4  0.0 - 40.0 mg/dL Final  . LDL Cholesterol 04/18/2017 90  0 - 99 mg/dL Final  . Total CHOL/HDL Ratio 04/18/2017 4   Final                  Men          Women1/2 Average Risk     3.4          3.3Average Risk          5.0          4.42X Average Risk          9.6          7.13X Average Risk          15.0          11.0                      . NonHDL 04/18/2017 125.96   Final   NOTE:  Non-HDL goal should be 30 mg/dL higher than patient's LDL goal (i.e. LDL goal of < 70 mg/dL, would have non-HDL goal of < 100 mg/dL)  . VITD 04/18/2017 18.48* 30.00 - 100.00 ng/mL Final  Admission on 03/29/2017, Discharged on 03/31/2017  Component Date Value Ref Range Status  . Glucose-Capillary 03/29/2017 219* 65 - 99 mg/dL Final  . Comment 1 03/29/2017 Notify RN   Final  . Comment 2 03/29/2017 Document in Chart   Final  . Glucose-Capillary 03/29/2017 209* 65 - 99 mg/dL Final  . Glucose-Capillary 03/29/2017 270* 65 - 99 mg/dL Final  . WBC 03/30/2017 12.0* 4.0 - 10.5 K/uL Final  . RBC 03/30/2017 4.52  3.87 - 5.11 MIL/uL Final  . Hemoglobin 03/30/2017 12.6  12.0 - 15.0 g/dL Final  . HCT 03/30/2017 37.7  36.0 - 46.0 % Final  . MCV 03/30/2017 83.4  78.0 - 100.0 fL Final  . MCH 03/30/2017 27.9  26.0 - 34.0 pg Final  . MCHC 03/30/2017 33.4  30.0 - 36.0 g/dL Final  . RDW 03/30/2017 12.9  11.5 - 15.5 % Final  . Platelets 03/30/2017 289  150 - 400 K/uL Final  . Sodium 03/30/2017 137  135 - 145 mmol/L Final  . Potassium 03/30/2017 4.5  3.5 - 5.1 mmol/L Final  . Chloride 03/30/2017 100* 101 - 111 mmol/L Final  . CO2 03/30/2017 30  22 - 32 mmol/L Final  . Glucose, Bld 03/30/2017 197* 65 - 99 mg/dL Final  . BUN 03/30/2017 12  6 - 20 mg/dL Final  . Creatinine, Ser 03/30/2017 0.87  0.44 - 1.00 mg/dL Final  . Calcium 03/30/2017 9.1  8.9 - 10.3 mg/dL Final  . GFR calc non Af Amer 03/30/2017 >60  >60 mL/min  Final  . GFR calc Af Amer 03/30/2017 >60  >60 mL/min Final   Comment: (NOTE) The eGFR has been calculated using the CKD EPI equation. This calculation has not been validated in all clinical situations. eGFR's persistently <60 mL/min signify possible Chronic Kidney Disease.   . Anion gap 03/30/2017 7  5 - 15 Final  . Prothrombin Time 03/30/2017 13.7  11.4 - 15.2 seconds Final  . INR 03/30/2017 1.05   Final  . aPTT 03/30/2017 29  24 - 36 seconds Final  . Glucose-Capillary 03/29/2017 331* 65 - 99 mg/dL Final  . Comment 1 03/29/2017 Notify RN   Final  . Comment 2 03/29/2017 Document in Chart   Final  . Glucose-Capillary 03/30/2017 178* 65 - 99 mg/dL Final  . Comment 1 03/30/2017 Notify RN   Final  . Comment 2 03/30/2017 Document in Chart   Final  . Glucose-Capillary 03/30/2017 233* 65 - 99 mg/dL Final  . Glucose-Capillary 03/30/2017 163* 65 - 99 mg/dL Final  . Comment 1 03/30/2017 Notify RN   Final  . Comment 2 03/30/2017 Document in Chart   Final  . Glucose-Capillary 03/30/2017 199* 65 - 99 mg/dL Final  . Comment 1 03/30/2017 Notify RN   Final  . Comment 2 03/30/2017 Document in Chart   Final  . Glucose-Capillary 03/31/2017 146* 65 - 99 mg/dL Final  . Comment 1 03/31/2017 Notify RN   Final  . Comment 2 03/31/2017 Document in Chart   Final  . Glucose-Capillary 03/31/2017 148* 65 - 99 mg/dL Final  . Comment 1 03/31/2017 Notify RN   Final  . Comment 2 03/31/2017 Document in Chart   Final  Hospital Outpatient Visit on 03/21/2017  Component Date Value Ref Range Status  . Glucose-Capillary 03/21/2017 167* 65 - 99 mg/dL Final  . MRSA, PCR 03/21/2017 NEGATIVE  NEGATIVE Final  . Staphylococcus aureus 03/21/2017 NEGATIVE  NEGATIVE Final   Comment:        The Xpert SA Assay (FDA approved for NASAL  specimens in patients over 39 years of age), is one component of a comprehensive surveillance program.  Test performance has been validated by [facility] for patients greater than or equal  to 17 year old. It is not intended to diagnose infection nor to guide or monitor treatment.   . WBC 03/21/2017 7.8  4.0 - 10.5 K/uL Final  . RBC 03/21/2017 4.98  3.87 - 5.11 MIL/uL Final  . Hemoglobin 03/21/2017 14.1  12.0 - 15.0 g/dL Final  . HCT 03/21/2017 41.7  36.0 - 46.0 % Final  . MCV 03/21/2017 83.7  78.0 - 100.0 fL Final  . MCH 03/21/2017 28.3  26.0 - 34.0 pg Final  . MCHC 03/21/2017 33.8  30.0 - 36.0 g/dL Final  . RDW 03/21/2017 12.9  11.5 - 15.5 % Final  . Platelets 03/21/2017 274  150 - 400 K/uL Final  . Sodium 03/21/2017 138  135 - 145 mmol/L Final  . Potassium 03/21/2017 3.5  3.5 - 5.1 mmol/L Final  . Chloride 03/21/2017 99* 101 - 111 mmol/L Final  . CO2 03/21/2017 28  22 - 32 mmol/L Final  . Glucose, Bld 03/21/2017 137* 65 - 99 mg/dL Final  . BUN 03/21/2017 6  6 - 20 mg/dL Final  . Creatinine, Ser 03/21/2017 0.82  0.44 - 1.00 mg/dL Final  . Calcium 03/21/2017 10.0  8.9 - 10.3 mg/dL Final  . GFR calc non Af Amer 03/21/2017 >60  >60 mL/min Final  . GFR calc Af Amer 03/21/2017 >60  >60 mL/min Final   Comment: (NOTE) The eGFR has been calculated using the CKD EPI equation. This calculation has not been validated in all clinical situations. eGFR's persistently <60 mL/min signify possible Chronic Kidney Disease.   . Anion gap 03/21/2017 11  5 - 15 Final  . Hgb A1c MFr Bld 03/21/2017 7.4* 4.8 - 5.6 % Final   Comment: (NOTE)         Pre-diabetes: 5.7 - 6.4         Diabetes: >6.4         Glycemic control for adults with diabetes: <7.0   . Mean Plasma Glucose 03/21/2017 166  mg/dL Final   Comment: (NOTE) Performed At: [facility] [place], [place] [id number] Lindon Romp MD RX:[id number]      Imaging: No results found.               Speciality Comments: No specialty comments available.    Procedures:  No procedures performed Allergies: Clindamycin/lincomycin; Adhesive [tape]; Prednisone; and Sudafed [pseudoephedrine  hcl]   Assessment / Plan:     Visit Diagnoses: Other systemic lupus erythematosus with other organ involvement (HCC)  Rheumatoid factor positive  Fibromyalgia  High risk medications (not anticoagulants) long-term use  Sjogren's syndrome, with unspecified organ involvement ([place])   Plan: #1: Systemic lupus erythematosus  #2: High risk prescription Methotrexate 0.8 ML per week Folic acid 2 mg daily Plaquenil 200 mg twice a day  #3: CBC with differential, CMP with GFR, urinalysis Sedimentation rate, C3-C4, vitamin D 25-oh, ANA with titer, dsDNA  #4:  Refill meds ==>not needed (has enough mtx and plq). Unable to refill steroid cream for mouth ulcers. Patient will need to see a dermatologist for that.  #5:  Rash to all over body.(back of head and under chin). C/w impetigo type rash (will f-u w/ dermatologist)  Orders: No orders of the defined types were placed in this encounter.  No orders of the defined types were placed  in this encounter.   Face-to-face time spent with patient was 30 minutes. Greater than 50% of time was spent in counseling and coordination of care.  Follow-Up Instructions: Return in about 5 months (around 12/18/2017) for FMS, FATIGUE,INSOMNIA, lupus, rash, oral ulcer.   Eliezer Lofts, PA-C  Note - This record has been created using [employer].  Chart creation errors have been sought, but may not always  have been located. Such creation errors do not reflect on  the standard of medical care.

## 2017-07-19 LAB — C3 AND C4
C3 COMPLEMENT: 155 mg/dL (ref 83–193)
C4 Complement: 21 mg/dL (ref 15–57)

## 2017-07-19 LAB — COMPLETE METABOLIC PANEL WITH GFR
ALBUMIN: 4.3 g/dL (ref 3.6–5.1)
ALK PHOS: 127 U/L (ref 33–130)
ALT: 11 U/L (ref 6–29)
AST: 19 U/L (ref 10–35)
BUN: 10 mg/dL (ref 7–25)
CALCIUM: 9.5 mg/dL (ref 8.6–10.4)
CO2: 28 mmol/L (ref 20–31)
Chloride: 98 mmol/L (ref 98–110)
Creat: 0.8 mg/dL (ref 0.50–1.05)
GFR, EST NON AFRICAN AMERICAN: 84 mL/min (ref >=60)
GFR, Est African American: >89 mL/min (ref >=60)
Glucose, Bld: 141 mg/dL — ABNORMAL HIGH (ref 65–99)
POTASSIUM: 4.1 mmol/L (ref 3.5–5.3)
Sodium: 138 mmol/L (ref 135–146)
Total Bilirubin: 0.5 mg/dL (ref 0.2–1.2)
Total Protein: 7.7 g/dL (ref 6.1–8.1)

## 2017-07-19 LAB — URINALYSIS, MICROSCOPIC ONLY
BACTERIA UA: NONE SEEN [HPF]
CASTS: NONE SEEN [LPF]
Crystals: NONE SEEN [HPF]
RBC / HPF: NONE SEEN RBC/HPF (ref <=2)
YEAST: NONE SEEN [HPF]

## 2017-07-19 LAB — ANA: Anti Nuclear Antibody(ANA): NEGATIVE

## 2017-07-19 LAB — URINALYSIS, ROUTINE W REFLEX MICROSCOPIC
Bilirubin Urine: NEGATIVE
GLUCOSE, UA: NEGATIVE
HGB URINE DIPSTICK: NEGATIVE
Ketones, ur: NEGATIVE
Nitrite: NEGATIVE
PH: 6 (ref 5.0–8.0)
Protein, ur: NEGATIVE
Specific Gravity, Urine: 1.013 (ref 1.001–1.035)

## 2017-07-19 LAB — VITAMIN D 25 HYDROXY (VIT D DEFICIENCY, FRACTURES): Vit D, 25-Hydroxy: 23 ng/mL — ABNORMAL LOW (ref 30–100)

## 2017-07-19 LAB — SEDIMENTATION RATE: SED RATE: 22 mm/h (ref 0–30)

## 2017-07-19 LAB — ANTI-DNA ANTIBODY, DOUBLE-STRANDED: ds DNA Ab: <1 IU/mL

## 2017-07-21 ENCOUNTER — Telehealth: Payer: Self-pay | Admitting: *Deleted

## 2017-07-21 ENCOUNTER — Ambulatory Visit (INDEPENDENT_AMBULATORY_CARE_PROVIDER_SITE_OTHER): Payer: BLUE CROSS/BLUE SHIELD | Admitting: Psychology

## 2017-07-21 DIAGNOSIS — F331 Major depressive disorder, recurrent, moderate: Secondary | ICD-10-CM

## 2017-07-21 DIAGNOSIS — E559 Vitamin D deficiency, unspecified: Secondary | ICD-10-CM

## 2017-07-21 DIAGNOSIS — F411 Generalized anxiety disorder: Secondary | ICD-10-CM

## 2017-07-21 MED ORDER — VITAMIN D (ERGOCALCIFEROL) 1.25 MG (50000 UNIT) PO CAPS: 50000.0000 [IU] | ORAL_CAPSULE | ORAL | 0 refills | Status: DC

## 2017-07-21 NOTE — Telephone Encounter (Signed)
-----   Message from [place], [place] sent at 07/19/2017  2:45 PM EDT ----- Please tell patient #1: CBC with differential is normal #2: Sedimentation rate is normal #3: C3-C4 normal #4: ANA is negative #5: dsDNA is negative #6: Urine shows WBCs at 6-10 (may be consistent with a UTI). She also has trace of leukocyte Estrace consistent with possible UTI Patient should discuss with her PCP. If symptomatic with dysuria pyuria urgency hesitancy frequency, she would benefit from treatment. #7: Vitamin D is low at 23. Let's treat the vitamin D with 50,000 units of vitamin D3 one by mouth on Wednesday and one on Sunday 12 weeks; dispensed 24 with no refill Recheck vitamin D in 3 months

## 2017-07-22 ENCOUNTER — Encounter: Payer: Self-pay | Admitting: Primary Care

## 2017-07-22 ENCOUNTER — Other Ambulatory Visit: Payer: Self-pay | Admitting: Primary Care

## 2017-07-23 ENCOUNTER — Encounter: Payer: Self-pay | Admitting: Rheumatology

## 2017-07-25 ENCOUNTER — Ambulatory Visit: Payer: BLUE CROSS/BLUE SHIELD | Admitting: Rheumatology

## 2017-07-25 NOTE — Telephone Encounter (Signed)
Pt. Should discuss with PCP. Shemay need referral to the pain clinic.

## 2017-07-26 ENCOUNTER — Telehealth: Payer: Self-pay | Admitting: Rheumatology

## 2017-07-26 NOTE — Telephone Encounter (Signed)
[facility] calling to schedule delivery for patients Hyalgan.

## 2017-07-27 ENCOUNTER — Other Ambulatory Visit: Payer: Self-pay | Admitting: Primary Care

## 2017-07-27 ENCOUNTER — Ambulatory Visit (INDEPENDENT_AMBULATORY_CARE_PROVIDER_SITE_OTHER): Payer: BLUE CROSS/BLUE SHIELD | Admitting: Primary Care

## 2017-07-27 ENCOUNTER — Encounter: Payer: Self-pay | Admitting: Primary Care

## 2017-07-27 VITALS — BP 142/78 | HR 84 | Temp 98.1°F | Wt 219.8 lb

## 2017-07-27 DIAGNOSIS — I10 Essential (primary) hypertension: Secondary | ICD-10-CM | POA: Diagnosis not present

## 2017-07-27 DIAGNOSIS — E119 Type 2 diabetes mellitus without complications: Secondary | ICD-10-CM

## 2017-07-27 LAB — HEMOGLOBIN A1C: Hgb A1c MFr Bld: 8.2 % — ABNORMAL HIGH (ref 4.6–6.5)

## 2017-07-27 MED ORDER — GLIPIZIDE 5 MG PO TABS: 5.0000 mg | ORAL_TABLET | Freq: Two times a day (BID) | ORAL | 1 refills | Status: DC

## 2017-07-27 NOTE — Assessment & Plan Note (Signed)
Improved from prior check but still above goal for patient. Stable for upcoming surgery. She will check her medication bottles at home to see if she's actually taking metoprolol, and how often she's taking this. Should be BID.   If she's taking all medications as prescribed then will increase clonidine to TID dosing. Discussed valsartan recall, she's not yet been notified. She will check with the pharmacy for the manufacturers recall list.

## 2017-07-27 NOTE — Assessment & Plan Note (Signed)
Last A1C at 7.4, would like to see her below 7. Diet is poor, recommendations provided. Repeat A1C today. Consider adding in Glipizide 5 mg BID if A1C increasing.

## 2017-07-27 NOTE — Progress Notes (Signed)
Subjective:    Patient ID: Lauren Winters, female    DOB: 26-Nov-1966, 51 y.o.   MRN: [id number]  HPI  Lauren Winters is a 51 year old female who presents today for medical clearance from a medical standpoint only. She will be going for a total right knee replacement with insertion of spinal cord stimulator.   1) Essential Hypertension: Currently managed on Clonidine 0.1 mg BID, amlodipine 5 mg BID, valsartan-HCTZ 320-25 mg, metoprolol tartrate 100 mg BID. She's checking her BP at home and is getting 130-140/80's. She's not sure if she's taking metoprolol, she will check her pill boxes when she gets home. She denies chest pain, dizziness, shortness of breath.   BP Readings from Last 3 Encounters:  07/27/17 (!) 142/78  07/18/17 (!) 216/99  05/11/17 (!) 148/72    2) Type 2 Diabetes: Last A1C of 7.4 in March 2018. She is currently managed on Metformin 1000 mg BID. She does not check her sugars regularly.  Diet currently consists of:  Breakfast: Cheese toast Lunch: Sandwich, salads Dinner: [place] burgers, veggies Snacks: Ice cream Desserts: 3 days weekly ice cream. Beverages: Water, diet Coke  Exercise: She is not currently exercise.     Review of Systems  Eyes: Negative for visual disturbance.  Respiratory: Negative for shortness of breath.   Cardiovascular: Negative for chest pain.  Musculoskeletal: Positive for arthralgias and back pain.  Neurological: Negative for numbness and headaches.       Past Medical History:  Diagnosis Date  . Anxiety   . Atypical chest pain    a. ETT 05/2014: normal stress test without evidence of myocardial ischemia or chest pain at peak stress  . Depression   . Diabetes mellitus without complication ([place]) metformin   dx 2015  . Fibromyalgia   . Heart murmur    told back in 2007, but no mention with 2016 cardiac w/u  . Hypertension   . Lupus (systemic lupus erythematosus) ([place])   . Mitral regurgitation    a. 05/2014 echo: EF >55%, nl LV/RV  sys fxn, mild MR/AR, no valvular stenosis   . Obesity (BMI 30-39.9)   . Raynaud's phenomenon   . Rheumatoid arthritis ([place])   . Sjogren's disease ([place])   . Tobacco abuse    a. ongoing; b. ongoing SOB     Social History   Social History  . Marital status: Married    Spouse name: N/A  . Number of children: N/A  . Years of education: N/A   Occupational History  . Not on file.   Social History Main Topics  . Smoking status: Former Smoker    Packs/day: 0.50    Years: 10.00    Types: Cigarettes    Quit date: 07/22/2015  . Smokeless tobacco: Never Used  . Alcohol use No  . Drug use: No  . Sexual activity: Not on file   Other Topics Concern  . Not on file   Social History Narrative   Married.   3 children.   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     Past Surgical History:  Procedure Laterality Date  . ABDOMINAL HYSTERECTOMY    . CESAREAN SECTION     x3  . LUMBAR LAMINECTOMY    . LUMBAR LAMINECTOMY/DECOMPRESSION MICRODISCECTOMY N/A 03/29/2017   Procedure: Re operative Right Lumbar Three-Four Laminectomy and discectomy with Left Lumbar Three-Four, Bilateral Lumbar Four-Five Laminectomy for decompression;  Surgeon: Kevan Ny Ditty, MD;  Location: [facility];  Service: Neurosurgery;  Laterality: N/A;  . TUBAL LIGATION      Family History  Problem Relation Age of Onset  . Hypertension Mother   . Valvular heart disease Mother   . CAD Father 62       CABG x 4  . Liver cancer Father 53       passed  . CAD Paternal Aunt        CABG  . Breast cancer Paternal Aunt   . CAD Paternal Aunt        CABG  . Breast cancer Paternal Aunt   . CAD Paternal Aunt        CABG  . Breast cancer Paternal Aunt   . CAD Paternal Aunt        CABG  . Breast cancer Paternal Aunt   . CAD Paternal Aunt        CABG  . Breast cancer Paternal Aunt   . CAD Paternal Uncle        MI s/p CABG  . Stroke Maternal Grandmother   . Stroke Maternal Grandfather     . CAD Maternal Grandfather     Allergies  Allergen Reactions  . Clindamycin/Lincomycin Hives  . Adhesive [Tape] Other (See Comments)    Sensitivity to certain band aids and adhesives - burns skin, makes skin raw  . Prednisone     Chest pain w/ cortisone shots or prednisone pills  . Sudafed [Pseudoephedrine Hcl] Other (See Comments)    Dizziness     Current Outpatient Prescriptions on File Prior to Visit  Medication Sig Dispense Refill  . amLODipine (NORVASC) 5 MG tablet Take 1 tablet (5 mg total) by mouth 2 (two) times daily. (Patient taking differently: Take 10 mg by mouth daily. ) 180 tablet 2  . buPROPion (WELLBUTRIN XL) 150 MG 24 hr tablet Take 1 tablet (150 mg total) by mouth daily. 30 tablet 5  . Clobetasol Prop Crea-Coal Tar (CLOBETA CREAM EX) Apply 1 application topically daily as needed (skin irritation).     . cloNIDine (CATAPRES) 0.1 MG tablet Take 1 tablet (0.1 mg total) by mouth 2 (two) times daily. 180 tablet 1  . cyclobenzaprine (FLEXERIL) 10 MG tablet Take 1 tablet (10 mg total) by mouth 3 (three) times daily as needed for muscle spasms. 90 tablet 2  . diclofenac (VOLTAREN) 75 MG EC tablet   0  . diclofenac sodium (VOLTAREN) 1 % GEL Apply 1 application topically daily as needed.    . doxycycline (VIBRAMYCIN) 100 MG capsule Take 1 capsule (100 mg total) by mouth 2 (two) times daily. 20 capsule 0  . DULoxetine (CYMBALTA) 30 MG capsule   0  . folic acid (FOLVITE) 1 MG tablet Take 2 tablets (2 mg total) by mouth daily. 180 tablet 4  . gabapentin (NEURONTIN) 300 MG capsule Take 300 mg by mouth 2 (two) times daily.   0  . Halcinonide 0.1 % OINT Apply 1 application topically 2 (two) times daily as needed.    Marland Kitchen HYDROcodone-acetaminophen (NORCO) 7.5-325 MG tablet Take 1-2 tablets by mouth every 4 (four) hours as needed for moderate pain. 60 tablet 0  . hydroxychloroquine (PLAQUENIL) 200 MG tablet Take 1 tablet (200 mg total) by mouth 2 (two) times daily. 60 tablet 0  .  hydrOXYzine (ATARAX/VISTARIL) 25 MG tablet Take 1 tablet (25 mg total) by mouth 2 (two) times daily as needed for anxiety. 30 tablet 0  . magic mouthwash w/lidocaine SOLN diphenhydramine/ 2%  VISCOUS lidocaine /antacid-MAALOX (  1:1:1); 15 mL swished in the mouth for 30 sec, then spit out.    May be used every 3 to 4 h 120 mL 2  . metFORMIN (GLUCOPHAGE) 1000 MG tablet Take 1 tablet (1,000 mg total) by mouth 2 (two) times daily with a meal. 180 tablet 3  . methocarbamol (ROBAXIN) 500 MG tablet TAKE 1 TABLET (500 MG TOTAL) BY MOUTH EVERY 8 (EIGHT) HOURS AS NEEDED FOR MUSCLE SPASMS.  0  . metoprolol (LOPRESSOR) 100 MG tablet Take 1 tablet (100 mg total) by mouth 2 (two) times daily. (Patient taking differently: Take 100 mg by mouth daily. ) 60 tablet 3  . pilocarpine (SALAGEN) 5 MG tablet Take 5 mg by mouth daily as needed.     . tacrolimus (PROTOPIC) 0.1 % ointment Apply 1 application topically 2 (two) times daily as needed.    . Triamcinolone Acetonide (TRIAMCINOLONE 0.1 % CREAM : EUCERIN) CREA Apply 1 application topically 2 (two) times daily as needed for rash or irritation.     . valsartan-hydrochlorothiazide (DIOVAN-HCT) 320-25 MG tablet Take 1 tablet by mouth daily. 90 tablet 3  . Vitamin D, Ergocalciferol, (DRISDOL) 50000 units CAPS capsule Take 1 capsule (50,000 Units total) by mouth 2 (two) times a week. 24 capsule 0   No current facility-administered medications on file prior to visit.     BP (!) 142/78 (BP Location: Left Arm, Patient Position: Sitting, Cuff Size: Normal)   Pulse 84   Temp 98.1 F (36.7 C) (Oral)   Wt 219 lb 12 oz (99.7 kg)   SpO2 98%   BMI 35.47 kg/m    Objective:   Physical Exam  Constitutional: She is oriented to person, place, and time. She appears well-nourished.  Eyes: Pupils are equal, round, and reactive to light.  Neck: Neck supple.  Cardiovascular: Normal rate and regular rhythm.   Pulmonary/Chest: Effort normal and breath sounds normal.  Abdominal:  Soft.  Neurological: She is alert and oriented to person, place, and time. No cranial nerve deficit.  Skin: Skin is warm and dry.  Psychiatric: She has a normal mood and affect.          Assessment & Plan:

## 2017-07-27 NOTE — Patient Instructions (Signed)
Please check to see if you are taking metoprolol tartrate 100 mg tablets. How many times daily are you taking this?  Please check your valsartan to see if this on the recall list.   Complete lab work prior to leaving today. I will notify you of your results once received.   It was a pleasure to see you today!

## 2017-07-27 NOTE — Telephone Encounter (Signed)
[facility] closed [facility] delivery, [facility] will reopen order.

## 2017-07-28 ENCOUNTER — Ambulatory Visit: Payer: BLUE CROSS/BLUE SHIELD | Admitting: Psychology

## 2017-07-28 NOTE — Telephone Encounter (Signed)
Left message for patient, [facility] will be calling patient to verify Hyalgan and delivery of medication, pending patient approved, LMOM for patient to expect call from [facility] w/phone #.

## 2017-08-01 ENCOUNTER — Telehealth (INDEPENDENT_AMBULATORY_CARE_PROVIDER_SITE_OTHER): Payer: Self-pay | Admitting: *Deleted

## 2017-08-01 NOTE — Telephone Encounter (Signed)
[facility] will delivery Hyalgan Bil to office 08/03/17. Patient purchase.

## 2017-08-04 ENCOUNTER — Telehealth: Payer: Self-pay | Admitting: *Deleted

## 2017-08-04 ENCOUNTER — Ambulatory Visit (INDEPENDENT_AMBULATORY_CARE_PROVIDER_SITE_OTHER): Payer: BLUE CROSS/BLUE SHIELD | Admitting: Psychology

## 2017-08-04 DIAGNOSIS — F331 Major depressive disorder, recurrent, moderate: Secondary | ICD-10-CM

## 2017-08-04 DIAGNOSIS — F410 Panic disorder [episodic paroxysmal anxiety] without agoraphobia: Secondary | ICD-10-CM | POA: Diagnosis not present

## 2017-08-04 NOTE — Telephone Encounter (Signed)
Hyalgan, BIL, patient purchased, delivered to office 08/03/17, please schedule appts. Thank you.

## 2017-08-08 ENCOUNTER — Encounter: Payer: Self-pay | Admitting: Rheumatology

## 2017-08-08 NOTE — Telephone Encounter (Signed)
I tried calling the patient but could not reach her. She is on Plaquenil and methotrexate which is quite aggressive therapy. The next step would be to try Benlysta. If she wants to try it she will read about it and let us know. We can apply for Benlysta. Her labs are stable. The methotrexate can be increased to 1 mL subcutaneous every week. If she increases the dose we will check labs and 2 weeks after increased dose.

## 2017-08-08 NOTE — Telephone Encounter (Signed)
I left a message on patients voicemail to call, and schedule Hyalgan injections.

## 2017-08-11 ENCOUNTER — Ambulatory Visit: Payer: BLUE CROSS/BLUE SHIELD | Admitting: Psychology

## 2017-08-11 ENCOUNTER — Other Ambulatory Visit: Payer: Self-pay | Admitting: Radiology

## 2017-08-11 ENCOUNTER — Encounter: Payer: Self-pay | Admitting: Rheumatology

## 2017-08-11 ENCOUNTER — Other Ambulatory Visit: Payer: Self-pay | Admitting: Rheumatology

## 2017-08-11 MED ORDER — HYDROXYCHLOROQUINE SULFATE 200 MG PO TABS: 200.0000 mg | ORAL_TABLET | Freq: Two times a day (BID) | ORAL | 0 refills | Status: DC

## 2017-08-11 NOTE — Telephone Encounter (Signed)
Normal Eye exam 12/20147 / no Plaquenil toxicity  Ok to refill per Dr Estanislado Pandy   Will you review her dose? She is 5'6" wt 227, Mr Carlyon Shadow has her on Plaquenil bid every day

## 2017-08-11 NOTE — Addendum Note (Signed)
Addended byCandice Camp on: 08/11/2017 11:37 AM   Modules accepted: Orders

## 2017-08-11 NOTE — Telephone Encounter (Signed)
Ok to keep current dose

## 2017-08-11 NOTE — Telephone Encounter (Signed)
07/18/17/ last visit  12/26/17 next visit    CBC Latest Ref Rng & Units 07/18/2017 03/30/2017 03/21/2017  WBC 3.8 - 10.8 K/uL 5.5 12.0(H) 7.8  Hemoglobin 11.7 - 15.5 g/dL 13.4 12.6 14.1  Hematocrit 35.0 - 45.0 % 39.8 37.7 41.7  Platelets 140 - 400 K/uL 318 289 274   CMP Latest Ref Rng & Units 07/18/2017 03/30/2017 03/21/2017  Glucose 65 - 99 mg/dL 141(H) 197(H) 137(H)  BUN 7 - 25 mg/dL 10 12 6   Creatinine 0.50 - 1.05 mg/dL 0.80 0.87 0.82  Sodium 135 - 146 mmol/L 138 137 138  Potassium 3.5 - 5.3 mmol/L 4.1 4.5 3.5  Chloride 98 - 110 mmol/L 98 100(L) 99(L)  CO2 20 - 31 mmol/L 28 30 28   Calcium 8.6 - 10.4 mg/dL 9.5 9.1 10.0  Total Protein 6.1 - 8.1 g/dL 7.7 - -  Total Bilirubin 0.2 - 1.2 mg/dL 0.5 - -  Alkaline Phos 33 - 130 U/L 127 - -  AST 10 - 35 U/L 19 - -  ALT 6 - 29 U/L 11 - -   Message sent to patient regarding when her PLQ eye exam was done

## 2017-08-15 ENCOUNTER — Encounter: Payer: BLUE CROSS/BLUE SHIELD | Attending: Psychology | Admitting: Psychology

## 2017-08-15 ENCOUNTER — Encounter: Payer: Self-pay | Admitting: Primary Care

## 2017-08-15 DIAGNOSIS — Z7984 Long term (current) use of oral hypoglycemic drugs: Secondary | ICD-10-CM | POA: Diagnosis not present

## 2017-08-15 DIAGNOSIS — M797 Fibromyalgia: Secondary | ICD-10-CM | POA: Insufficient documentation

## 2017-08-15 DIAGNOSIS — I10 Essential (primary) hypertension: Secondary | ICD-10-CM | POA: Diagnosis not present

## 2017-08-15 DIAGNOSIS — M3500 Sicca syndrome, unspecified: Secondary | ICD-10-CM | POA: Insufficient documentation

## 2017-08-15 DIAGNOSIS — F4323 Adjustment disorder with mixed anxiety and depressed mood: Secondary | ICD-10-CM | POA: Diagnosis not present

## 2017-08-15 DIAGNOSIS — M961 Postlaminectomy syndrome, not elsewhere classified: Secondary | ICD-10-CM | POA: Insufficient documentation

## 2017-08-15 DIAGNOSIS — M069 Rheumatoid arthritis, unspecified: Secondary | ICD-10-CM | POA: Diagnosis not present

## 2017-08-15 DIAGNOSIS — Z800 Family history of malignant neoplasm of digestive organs: Secondary | ICD-10-CM | POA: Insufficient documentation

## 2017-08-15 DIAGNOSIS — Z8249 Family history of ischemic heart disease and other diseases of the circulatory system: Secondary | ICD-10-CM | POA: Insufficient documentation

## 2017-08-15 DIAGNOSIS — M329 Systemic lupus erythematosus, unspecified: Secondary | ICD-10-CM | POA: Insufficient documentation

## 2017-08-15 DIAGNOSIS — E119 Type 2 diabetes mellitus without complications: Secondary | ICD-10-CM | POA: Insufficient documentation

## 2017-08-17 ENCOUNTER — Ambulatory Visit: Payer: BLUE CROSS/BLUE SHIELD | Admitting: Rheumatology

## 2017-08-17 DIAGNOSIS — M170 Bilateral primary osteoarthritis of knee: Secondary | ICD-10-CM

## 2017-08-17 NOTE — Progress Notes (Deleted)
   Procedure Note  Patient: Lauren Winters             Date of Birth: 1966-08-08           MRN: [id number]             Visit Date: 08/17/2017  Procedures: Visit Diagnoses:  No procedures performed

## 2017-08-18 ENCOUNTER — Ambulatory Visit (INDEPENDENT_AMBULATORY_CARE_PROVIDER_SITE_OTHER): Payer: BLUE CROSS/BLUE SHIELD | Admitting: Psychology

## 2017-08-18 DIAGNOSIS — F331 Major depressive disorder, recurrent, moderate: Secondary | ICD-10-CM | POA: Diagnosis not present

## 2017-08-18 DIAGNOSIS — F410 Panic disorder [episodic paroxysmal anxiety] without agoraphobia: Secondary | ICD-10-CM | POA: Diagnosis not present

## 2017-08-18 NOTE — Telephone Encounter (Signed)
We need a note from dermatologist regarding the diagnosis of the dermatitis.

## 2017-08-18 NOTE — Addendum Note (Signed)
Addendum  created 08/18/17 1458 by Gayl Ivanoff, MD   Sign clinical note    

## 2017-08-18 NOTE — Addendum Note (Signed)
Addendum  created 08/18/17 1213 by Roberts Gaudy, MD   Sign clinical note

## 2017-08-22 NOTE — Progress Notes (Signed)
   Procedure Note  Patient: KATJA BLUE             Date of Birth: 02-05-1966           MRN: [id number]             Visit Date: 08/24/2017  Procedures: Visit Diagnoses: Primary osteoarthritis of both knees Hyalgan#2 Bilateral knees (patient purchased) Large Joint Inj Date/Time: 08/24/2017 12:40 PM Performed by: Bo Merino Authorized by: Bo Merino   Consent Given by:  Patient Site marked: the procedure site was marked   Timeout: prior to procedure the correct patient, procedure, and site was verified   Indications:  Pain and joint swelling Location:  Knee Site:  R knee Prep: patient was prepped and draped in usual sterile fashion   Needle Size:  27 G Needle Length:  1.5 inches Approach:  Medial Ultrasound Guidance: No   Fluoroscopic Guidance: No   Arthrogram: No   Medications:  1.5 mL lidocaine 1 %; 20 mg Sodium Hyaluronate 20 MG/2ML Aspiration Attempted: Yes   Aspirate amount (mL):  0 Patient tolerance:  Patient tolerated the procedure well with no immediate complications Large Joint Inj Date/Time: 08/24/2017 12:40 PM Performed by: Bo Merino Authorized by: Bo Merino   Consent Given by:  Patient Site marked: the procedure site was marked   Timeout: prior to procedure the correct patient, procedure, and site was verified   Indications:  Pain and joint swelling Location:  Knee Site:  L knee Prep: patient was prepped and draped in usual sterile fashion   Needle Size:  27 G Needle Length:  1.5 inches Approach:  Medial Ultrasound Guidance: No   Fluoroscopic Guidance: No   Arthrogram: No   Medications:  1.5 mL lidocaine 1 %; 20 mg Sodium Hyaluronate 20 MG/2ML Aspiration Attempted: Yes   Aspirate amount (mL):  0 Patient tolerance:  Patient tolerated the procedure well with no immediate complications   Bo Merino, MD

## 2017-08-24 ENCOUNTER — Telehealth: Payer: Self-pay | Admitting: Rheumatology

## 2017-08-24 ENCOUNTER — Ambulatory Visit (INDEPENDENT_AMBULATORY_CARE_PROVIDER_SITE_OTHER): Payer: BLUE CROSS/BLUE SHIELD | Admitting: Rheumatology

## 2017-08-24 DIAGNOSIS — M1711 Unilateral primary osteoarthritis, right knee: Secondary | ICD-10-CM | POA: Diagnosis not present

## 2017-08-24 DIAGNOSIS — M1712 Unilateral primary osteoarthritis, left knee: Secondary | ICD-10-CM | POA: Diagnosis not present

## 2017-08-24 DIAGNOSIS — M170 Bilateral primary osteoarthritis of knee: Secondary | ICD-10-CM

## 2017-08-24 MED ORDER — SODIUM HYALURONATE (VISCOSUP) 20 MG/2ML IX SOSY
20.0000 mg | PREFILLED_SYRINGE | INTRA_ARTICULAR | Status: AC | PRN
  Administered 2017-08-24: 20 mg via INTRA_ARTICULAR

## 2017-08-24 MED ORDER — LIDOCAINE HCL 1 % IJ SOLN
1.5000 mL | INTRAMUSCULAR | Status: AC | PRN
  Administered 2017-08-24: 1.5 mL

## 2017-08-24 NOTE — Telephone Encounter (Signed)
Patient brought  Her Dermtopathology report today which shows that her skin bx is c/w lupus. I spoke with Pt. She has been on SQ MTX and PLQ.  I discussed the option of adding Benlysta. She was in agreement. She has already done some reading on it. Please apply for Benlysta.  Bo Merino, MD

## 2017-08-25 ENCOUNTER — Ambulatory Visit (INDEPENDENT_AMBULATORY_CARE_PROVIDER_SITE_OTHER): Payer: BLUE CROSS/BLUE SHIELD | Admitting: Psychology

## 2017-08-25 DIAGNOSIS — F331 Major depressive disorder, recurrent, moderate: Secondary | ICD-10-CM

## 2017-08-25 NOTE — Telephone Encounter (Signed)
Please apply for [facility]

## 2017-08-25 NOTE — Telephone Encounter (Signed)
Started a prior authorization for [employer]. A SELENA-SLEDAI is required for [facility] PA. For a complete assessment using the SELENA-SLEDAI, patient will need spot urine for protein to creatine ratio, urinalysis complement.    Ceclia Koker, [place], CPhT 10:49 AM

## 2017-08-26 NOTE — Progress Notes (Signed)
   Procedure Note  Patient: Lauren Winters             Date of Birth: [birth year]           MRN: [id number]             Visit Date: 08/31/2017  Procedures: Visit Diagnoses: No diagnosis found. Hyalgan #3 bilateral knees  Large Joint Inj Date/Time: 08/31/2017 11:12 AM Performed by: Bo Merino Authorized by: Bo Merino   Consent Given by:  Patient Site marked: the procedure site was marked   Timeout: prior to procedure the correct patient, procedure, and site was verified   Indications:  Pain Location:  Knee Site:  R knee Prep: patient was prepped and draped in usual sterile fashion   Needle Size:  27 G Needle Length:  1.5 inches Ultrasound Guidance: No   Fluoroscopic Guidance: No   Arthrogram: No   Medications:  1.5 mL lidocaine 1 %; 20 mg Sodium Hyaluronate 20 MG/2ML Aspiration Attempted: Yes   Patient tolerance:  Patient tolerated the procedure well with no immediate complications Large Joint Inj Date/Time: 08/31/2017 11:13 AM Performed by: Bo Merino Authorized by: Bo Merino   Consent Given by:  Patient Site marked: the procedure site was marked   Timeout: prior to procedure the correct patient, procedure, and site was verified   Indications:  Pain Location:  Knee Site:  L knee Prep: patient was prepped and draped in usual sterile fashion   Needle Size:  27 G Needle Length:  1.5 inches Ultrasound Guidance: No   Fluoroscopic Guidance: No   Arthrogram: No   Medications:  1.5 mL lidocaine 1 %; 20 mg Sodium Hyaluronate 20 MG/2ML Aspiration Attempted: Yes   Patient tolerance:  Patient tolerated the procedure well with no immediate complications   Bo Merino, MD

## 2017-08-31 ENCOUNTER — Ambulatory Visit (INDEPENDENT_AMBULATORY_CARE_PROVIDER_SITE_OTHER): Payer: BLUE CROSS/BLUE SHIELD | Admitting: Rheumatology

## 2017-08-31 ENCOUNTER — Telehealth: Payer: Self-pay | Admitting: Rheumatology

## 2017-08-31 DIAGNOSIS — M170 Bilateral primary osteoarthritis of knee: Secondary | ICD-10-CM | POA: Diagnosis not present

## 2017-08-31 DIAGNOSIS — M3219 Other organ or system involvement in systemic lupus erythematosus: Secondary | ICD-10-CM

## 2017-08-31 MED ORDER — SODIUM HYALURONATE (VISCOSUP) 20 MG/2ML IX SOSY
20.0000 mg | PREFILLED_SYRINGE | INTRA_ARTICULAR | Status: AC | PRN
  Administered 2017-08-31: 20 mg via INTRA_ARTICULAR

## 2017-08-31 MED ORDER — LIDOCAINE HCL 1 % IJ SOLN
1.5000 mL | INTRAMUSCULAR | Status: AC | PRN
  Administered 2017-08-31: 1.5 mL

## 2017-08-31 NOTE — Telephone Encounter (Signed)
Attempted to contact the patient and left message for patient to call the office.  

## 2017-08-31 NOTE — Telephone Encounter (Signed)
Patient would like to know if benlysta has been approved by her insurance company? Please advise.

## 2017-08-31 NOTE — Telephone Encounter (Signed)
The PA is currently pending because we need a Selena-Sledai score and a urin spot test.

## 2017-08-31 NOTE — Telephone Encounter (Signed)
Patient advised we need a spot urine and states she will be in the office next Wednesday. Patient states she will do it at that appointment.

## 2017-09-01 ENCOUNTER — Ambulatory Visit: Payer: BLUE CROSS/BLUE SHIELD | Admitting: Psychology

## 2017-09-07 ENCOUNTER — Ambulatory Visit (INDEPENDENT_AMBULATORY_CARE_PROVIDER_SITE_OTHER): Payer: BLUE CROSS/BLUE SHIELD | Admitting: Psychology

## 2017-09-07 ENCOUNTER — Ambulatory Visit (INDEPENDENT_AMBULATORY_CARE_PROVIDER_SITE_OTHER): Payer: BLUE CROSS/BLUE SHIELD | Admitting: Rheumatology

## 2017-09-07 ENCOUNTER — Other Ambulatory Visit: Payer: Self-pay | Admitting: Radiology

## 2017-09-07 DIAGNOSIS — F331 Major depressive disorder, recurrent, moderate: Secondary | ICD-10-CM

## 2017-09-07 DIAGNOSIS — M170 Bilateral primary osteoarthritis of knee: Secondary | ICD-10-CM | POA: Diagnosis not present

## 2017-09-07 DIAGNOSIS — F410 Panic disorder [episodic paroxysmal anxiety] without agoraphobia: Secondary | ICD-10-CM

## 2017-09-07 DIAGNOSIS — M3219 Other organ or system involvement in systemic lupus erythematosus: Secondary | ICD-10-CM

## 2017-09-07 MED ORDER — SODIUM HYALURONATE (VISCOSUP) 20 MG/2ML IX SOSY
20.0000 mg | PREFILLED_SYRINGE | INTRA_ARTICULAR | Status: AC | PRN
  Administered 2017-09-07: 20 mg via INTRA_ARTICULAR

## 2017-09-07 MED ORDER — LIDOCAINE HCL 1 % IJ SOLN
1.5000 mL | INTRAMUSCULAR | Status: AC | PRN
  Administered 2017-09-07: 1.5 mL

## 2017-09-07 NOTE — Progress Notes (Signed)
   Procedure Note  Patient: Lauren Winters             Date of Birth: 07-03-1966           MRN: [id number]             Visit Date: 09/07/2017  Procedures: Visit Diagnoses: Bilateral primary osteoarthritis of knee - Plan: Large Joint Injection/Arthrocentesis, Large Joint Injection/Arthrocentesis Hyalgan #4 bilateral knees  Large Joint Inj Date/Time: 09/07/2017 11:27 AM Performed by: Bo Merino Authorized by: Bo Merino   Consent Given by:  Patient Site marked: the procedure site was marked   Timeout: prior to procedure the correct patient, procedure, and site was verified   Indications:  Pain Location:  Knee Site:  L knee Prep: patient was prepped and draped in usual sterile fashion   Needle Size:  27 G Needle Length:  1.5 inches Ultrasound Guidance: No   Fluoroscopic Guidance: No   Arthrogram: No   Medications:  1.5 mL lidocaine 1 %; 20 mg Sodium Hyaluronate 20 MG/2ML Aspiration Attempted: Yes   Patient tolerance:  Patient tolerated the procedure well with no immediate complications Large Joint Inj Date/Time: 09/07/2017 11:27 AM Performed by: Bo Merino Authorized by: Bo Merino   Consent Given by:  Patient Site marked: the procedure site was marked   Timeout: prior to procedure the correct patient, procedure, and site was verified   Indications:  Pain Location:  Knee Site:  L knee Prep: patient was prepped and draped in usual sterile fashion   Needle Size:  27 G Needle Length:  1.5 inches Ultrasound Guidance: No   Fluoroscopic Guidance: No   Arthrogram: No   Medications:  1.5 mL lidocaine 1 %; 20 mg Sodium Hyaluronate 20 MG/2ML Aspiration Attempted: Yes   Patient tolerance:  Patient tolerated the procedure well with no immediate complications    Bo Merino, MD

## 2017-09-08 LAB — PROTEIN / CREATININE RATIO, URINE
Creatinine, Urine: 218 mg/dL (ref 20–275)
Protein/Creat Ratio: 151 mg/g creat (ref 21–161)
Total Protein, Urine: 33 mg/dL — ABNORMAL HIGH (ref 5–24)

## 2017-09-08 NOTE — Progress Notes (Signed)
review 

## 2017-09-12 ENCOUNTER — Encounter: Payer: Self-pay | Admitting: Rheumatology

## 2017-09-12 NOTE — Progress Notes (Signed)
   Procedure Note  Patient: Lauren Winters             Date of Birth: 1966/12/18           MRN: [id number]             Visit Date: 09/14/2017  Procedures: Visit Diagnoses: Dysuria - Plan: Urinalysis, Routine w reflex microscopic  Primary osteoarthritis of both knees Hyalgan #5 bilateral patient purchase  Large Joint Inj Date/Time: 09/14/2017 11:53 AM Performed by: Bo Merino Authorized by: Bo Merino   Consent Given by:  Patient Site marked: the procedure site was marked   Timeout: prior to procedure the correct patient, procedure, and site was verified   Indications:  Pain and joint swelling Location:  Knee Site:  R knee Prep: patient was prepped and draped in usual sterile fashion   Needle Size:  27 G Needle Length:  1.5 inches Approach:  Medial Ultrasound Guidance: No   Fluoroscopic Guidance: No   Arthrogram: No   Medications:  1.5 mL lidocaine 1 %; 20 mg Sodium Hyaluronate 20 MG/2ML Aspiration Attempted: Yes   Aspirate amount (mL):  0 Patient tolerance:  Patient tolerated the procedure well with no immediate complications Large Joint Inj Date/Time: 09/14/2017 11:54 AM Performed by: Bo Merino Authorized by: Bo Merino   Consent Given by:  Patient Site marked: the procedure site was marked   Timeout: prior to procedure the correct patient, procedure, and site was verified   Indications:  Pain and joint swelling Location:  Knee Site:  L knee Prep: patient was prepped and draped in usual sterile fashion   Needle Size:  27 G Needle Length:  1.5 inches Approach:  Medial Ultrasound Guidance: No   Fluoroscopic Guidance: No   Arthrogram: No   Medications:  1.5 mL lidocaine 1 %; 20 mg Sodium Hyaluronate 20 MG/2ML Aspiration Attempted: Yes   Aspirate amount (mL):  0 Patient tolerance:  Patient tolerated the procedure well with no immediate complications    Bo Merino, MD, [facility]ious Payer

## 2017-09-14 ENCOUNTER — Ambulatory Visit (INDEPENDENT_AMBULATORY_CARE_PROVIDER_SITE_OTHER): Payer: BLUE CROSS/BLUE SHIELD | Admitting: Rheumatology

## 2017-09-14 DIAGNOSIS — R300 Dysuria: Secondary | ICD-10-CM

## 2017-09-14 DIAGNOSIS — M170 Bilateral primary osteoarthritis of knee: Secondary | ICD-10-CM

## 2017-09-14 MED ORDER — LIDOCAINE HCL 1 % IJ SOLN
1.5000 mL | INTRAMUSCULAR | Status: AC | PRN
  Administered 2017-09-14: 1.5 mL

## 2017-09-14 MED ORDER — SODIUM HYALURONATE (VISCOSUP) 20 MG/2ML IX SOSY
20.0000 mg | PREFILLED_SYRINGE | INTRA_ARTICULAR | Status: AC | PRN
  Administered 2017-09-14: 20 mg via INTRA_ARTICULAR

## 2017-09-15 ENCOUNTER — Encounter: Payer: Self-pay | Admitting: Rheumatology

## 2017-09-15 ENCOUNTER — Ambulatory Visit: Payer: BLUE CROSS/BLUE SHIELD | Admitting: Psychology

## 2017-09-15 LAB — URINALYSIS, ROUTINE W REFLEX MICROSCOPIC
Bilirubin Urine: NEGATIVE
Hgb urine dipstick: NEGATIVE
NITRITE: NEGATIVE
RBC / HPF: NONE SEEN /HPF (ref 0–2)
Specific Gravity, Urine: 1.031 (ref 1.001–1.03)

## 2017-09-15 LAB — MICROSCOPIC MESSAGE

## 2017-09-15 NOTE — Progress Notes (Signed)
UA indicates UTI. Please advise patient to see her PCP.

## 2017-09-16 ENCOUNTER — Encounter: Payer: Self-pay | Admitting: Primary Care

## 2017-09-18 ENCOUNTER — Encounter: Payer: Self-pay | Admitting: Psychology

## 2017-09-18 NOTE — Progress Notes (Signed)
Neuropsychological Consultation   Patient:   Lauren Winters   DOB:   23-Jun-1966  MR Number:  [id number]  Location:  [facility] [address], [place] [id number] [place] [address] Dept: [phone]           Date of Service:   08/13/2018  Start Time:   2 PM End Time:   3 PM  Provider/Observer:  Ilean Skill, Psy.D.       Clinical Neuropsychologist       Billing Code/Service: [phone] 4 Units  Chief Complaint:    The patient was referred by Dr. Brien Few for psychological evaluation as part of the standard evaluation process including a psychological evaluation for consideration of spinal cord stimulator trialing and possible implantation. The patient has had a number of autoimmune and metabolic issues and was diagnosed with lupus in 2014 and rheumatoid arthritis as well as Sjogren's disease. She is currently a lupus flare according to the patient. She reports that she is allergic to steroids so she can actively treat her current lupus symptoms. The patient also has a history of depression anxiety. However, she has significant residual pain from prior efforts to treat her lumbar issues at L3/4 and L3-4-5. There also some residual scar tissues in this area that are not operable.  Reason for Service:  Terrence Pizana was referred for psychological evaluation as part of the standard procedure for workup for possible spinal cord stimulator trialing implantation. The patient does have a history of depression and anxiety secondary to her significant pain in medical issues.  Current Status:  The patient reports that she has ongoing symptoms of significant chronic pain as well as symptoms of anxiety, depression, mood changes, sleep disturbance, loss of interest, agitation, excessive worrying, low energy, panic attacks, and attention/concentration issues. She reports that the primary issues of  anxiety and depression are due to major life changes in health issues. She reports that she has had these symptoms for approximately 2 years.  Reliability of Information: Information is provided by review of available medical records as well as 1 hour face-to-face clinical interview with the patient.  Behavioral Observation: TAHJANAE BLANKENBURG  presents as a 51 y.o.-year-old Right  Female who appeared her stated age. her dress was Appropriate and she was Well Groomed and her manners were Appropriate to the situation.  her participation was indicative of Appropriate and Attentive behaviors.  There were any physical disabilities noted.  she displayed an appropriate level of cooperation and motivation.     Interactions:    Active Appropriate and Attentive  Attention:   within normal limits and attention span and concentration were age appropriate  Memory:   within normal limits; recent and remote memory intact  Visuo-spatial:  within normal limits  Speech (Volume):  normal  Speech:   normal; normal  Thought Process:  Coherent and Relevant  Though Content:  WNL; not suicidal  Orientation:   person, place, time/date and situation  Judgment:   Good  Planning:   Good  Affect:    Anxious  Mood:    Anxious and Depressed  Insight:   Good  Intelligence:   high  Marital Status/Living: The patient reports that she was born in [place] and grew up in [place]. She is married and has 3 children. She has a 47 year old son, 41 year old daughter and an 36 year old daughter. She currently lives with her husband and youngest daughter.  She sees her mother every day. Her father is deceased.  Substance Use:  No concerns of substance abuse are reported.    Education:   HS Graduate  Medical History:   Past Medical History:  Diagnosis Date  . Anxiety   . Atypical chest pain    a. ETT 05/2014: normal stress test without evidence of myocardial ischemia or chest pain at  peak stress  . Depression   . Diabetes mellitus without complication ([place]) metformin   dx 2015  . Fibromyalgia   . Heart murmur    told back in 2007, but no mention with 2016 cardiac w/u  . Hypertension   . Lupus (systemic lupus erythematosus) ([place])   . Mitral regurgitation    a. 05/2014 echo: EF >55%, nl LV/RV sys fxn, mild MR/AR, no valvular stenosis   . Obesity (BMI 30-39.9)   . Raynaud's phenomenon   . Rheumatoid arthritis ([place])   . Sjogren's disease ([place])   . Tobacco abuse    a. ongoing; b. ongoing SOB           Psychiatric History:  The patient does have a history of anxiety and depression and treatment for this. When she was 51 years old she had a lot of stress in her life primarily from school and reports some suicidal ideation but none since then.  Family Med/Psych History:  Family History  Problem Relation Age of Onset  . Hypertension Mother   . Valvular heart disease Mother   . CAD Father 27       CABG x 4  . Liver cancer Father 51       passed  . CAD Paternal Aunt        CABG  . Breast cancer Paternal Aunt   . CAD Paternal Aunt        CABG  . Breast cancer Paternal Aunt   . CAD Paternal Aunt        CABG  . Breast cancer Paternal Aunt   . CAD Paternal Aunt        CABG  . Breast cancer Paternal Aunt   . CAD Paternal Aunt        CABG  . Breast cancer Paternal Aunt   . CAD Paternal Uncle        MI s/p CABG  . Stroke Maternal Grandmother   . Stroke Maternal Grandfather   . CAD Maternal Grandfather     Risk of Suicide/Violence: low the patient denies any current suicidal or homicidal ideation.  Impression/DX:  The patient was referred by Dr. Brien Few for psychological evaluation as part of the standard evaluation process including a psychological evaluation for consideration of spinal cord stimulator trialing and possible implantation. The patient has had a number of autoimmune and metabolic issues and was diagnosed with lupus in 2014 and rheumatoid arthritis  as well as Sjogren's disease. She is currently a lupus flare according to the patient. She reports that she is allergic to steroids so she can actively treat her current lupus symptoms. The patient also has a history of depression anxiety. However, she has significant residual pain from prior efforts to treat her lumbar issues at L3/4 and L3-4-5. There also some residual scar tissues in this area that are not operable  Disposition/Plan:  The patient will complete the [place] multiphasic personality inventory-2 as well as the pain patient profile  Diagnosis:    Lumbar post-laminectomy syndrome  Adjustment disorder with mixed anxiety and depressed mood  Electronically Signed   _______________________ Ilean Skill, Psy.D.

## 2017-09-19 ENCOUNTER — Ambulatory Visit (INDEPENDENT_AMBULATORY_CARE_PROVIDER_SITE_OTHER): Payer: BLUE CROSS/BLUE SHIELD | Admitting: Primary Care

## 2017-09-19 ENCOUNTER — Encounter: Payer: BLUE CROSS/BLUE SHIELD | Attending: Psychology | Admitting: Psychology

## 2017-09-19 ENCOUNTER — Encounter: Payer: Self-pay | Admitting: Primary Care

## 2017-09-19 VITALS — BP 122/84 | HR 82 | Temp 98.0°F | Ht 66.0 in | Wt 220.8 lb

## 2017-09-19 DIAGNOSIS — I10 Essential (primary) hypertension: Secondary | ICD-10-CM | POA: Diagnosis not present

## 2017-09-19 DIAGNOSIS — M961 Postlaminectomy syndrome, not elsewhere classified: Secondary | ICD-10-CM | POA: Insufficient documentation

## 2017-09-19 DIAGNOSIS — F4323 Adjustment disorder with mixed anxiety and depressed mood: Secondary | ICD-10-CM | POA: Insufficient documentation

## 2017-09-19 DIAGNOSIS — M797 Fibromyalgia: Secondary | ICD-10-CM | POA: Insufficient documentation

## 2017-09-19 DIAGNOSIS — R8290 Unspecified abnormal findings in urine: Secondary | ICD-10-CM

## 2017-09-19 DIAGNOSIS — M3500 Sicca syndrome, unspecified: Secondary | ICD-10-CM | POA: Diagnosis not present

## 2017-09-19 DIAGNOSIS — E119 Type 2 diabetes mellitus without complications: Secondary | ICD-10-CM | POA: Diagnosis not present

## 2017-09-19 DIAGNOSIS — Z800 Family history of malignant neoplasm of digestive organs: Secondary | ICD-10-CM | POA: Insufficient documentation

## 2017-09-19 DIAGNOSIS — F331 Major depressive disorder, recurrent, moderate: Secondary | ICD-10-CM

## 2017-09-19 DIAGNOSIS — M329 Systemic lupus erythematosus, unspecified: Secondary | ICD-10-CM | POA: Diagnosis not present

## 2017-09-19 DIAGNOSIS — Z8249 Family history of ischemic heart disease and other diseases of the circulatory system: Secondary | ICD-10-CM | POA: Diagnosis not present

## 2017-09-19 DIAGNOSIS — Z7984 Long term (current) use of oral hypoglycemic drugs: Secondary | ICD-10-CM | POA: Diagnosis not present

## 2017-09-19 DIAGNOSIS — M069 Rheumatoid arthritis, unspecified: Secondary | ICD-10-CM | POA: Insufficient documentation

## 2017-09-19 LAB — POC URINALSYSI DIPSTICK (AUTOMATED)
BILIRUBIN UA: NEGATIVE
Blood, UA: NEGATIVE
GLUCOSE UA: NEGATIVE
Leukocytes, UA: NEGATIVE
Nitrite, UA: NEGATIVE
Protein, UA: NEGATIVE
SPEC GRAV UA: 1.02 (ref 1.010–1.025)
Urobilinogen, UA: 0.2 E.U./dL
pH, UA: 6.5 (ref 5.0–8.0)

## 2017-09-19 NOTE — Patient Instructions (Signed)
You do not have a urinary tract infection.  Be sure they always give you a wipe to use prior to providing a specimen.  It was a pleasure to see you today!

## 2017-09-19 NOTE — Progress Notes (Signed)
Patient:  Lauren Winters   DOB: 1966-02-05  MR Number: [id number]  Location: [facility] [address], [place] [address] Dept: [phone]  Start: 4 PM End: 5 PM  Provider/Observer:     Edgardo Roys PSYD  Chief Complaint:      Chief Complaint  Patient presents with  . Pain  . Anxiety  . Depression    Reason For Service:     The patient was referred by Dr. Brien Few for psychological evaluation as part of the standard evaluation process including a psychological evaluation for consideration of spinal cord stimulator trialing and possible implantation. The patient has had a number of autoimmune and metabolic issues and was diagnosed with lupus in 2014 and rheumatoid arthritis as well as Sjogren's disease. She is currently a lupus flare according to the patient. She reports that she is allergic to steroids so she can actively treat her current lupus symptoms. The patient also has a history of depression anxiety. However, she has significant residual pain from prior efforts to treat her lumbar issues at L3/4 and L3-4-5. There also some residual scar tissues in this area that are not operable.  Testing Administered:  The patient completed the [place] multiphasic personality inventory-2 as well as the pain patient profile.  Participation Level:   Active  Participation Quality:  Appropriate and Attentive      Behavioral Observation:  Well Groomed, Alert, and Appropriate.   Test Results:   The patient initially completed the [place] multiphasic personality inventory-2. The resulting validity scales suggest that the patient approach this measure in an honest and straightforward manner neither attempting to exaggerate or minimize her current symptomatology. This does appear to be a valid administration.  Resulting basic clinical scales show that there are  significant issues with depressive symptoms as well as some anxiety symptoms. The patient also has significant endorsement of a number of both leg as well as very specific physical and health concerns. No other clinical scales were elevated.  Further analysis utilizing content scales do show an elevation in both anxiety symptoms as well as specific health concerns. This pattern of elevated issues of health concerns and physical complaints are consistent with her significant medical difficulties including nerve root impingement and residual scar tissue as well as a diagnosis of lupus and Raynaud's/Sojourn's disease  Further analysis utilizing supplementary scales show that the patient is putting a lot of psychological effort into pushing away repressing significant symptoms of depression and trying to deal with her chronic pain situation. The patient does not have significant elevations in either of the posttraumatic stress disorder scales and does not have a significant elevation on scales that a been shown to be sensitive to vulnerability to substance abuse and alcohol abuse. The patient does have an elevation on measures associated with avoiding social situations.  Breaking down components of the standard clinical scales show that most of her depressive symptoms have to do with subjective feelings of helplessness and hopelessness, reduced psychomotor speed and physical fatigue, mental dullness and cognitive difficulties. There is no indication of rumination such as brooding or hypervigilance. The patient also shows a great deal of malaise and reports of lack of energy. The patient currently is feeling a lack of control over her emotions and thoughts.  The patient also completed the pain patient profile which is a measure that helps adjust for clinical elevations on the MMPI that can  be misinterpreted and a chronic pain patient population. The P3 inventory helps adjust for issues such as somatizations  issues and elevations in depression and anxiety that can be found to be over represented in a pain patient population.  The resulting profile do show significant elevations on somatizations symptoms and complaints of physical difficulties that go beyond those typically found with chronic pain patients. However, the patient also has a number of medical issues beyond her chronic pain symptoms including previous diagnosis of lupus as well as fibromyalgia and autoimmune disorders. The patient has a significant elevation on both elements of anxiety and depression relative to a community-based normative sample and a significant elevation in depression relative to both a community-based sample as well as a chronic pain patient sample. Therefore do think that the patient is dealing with a major depressive event that goes beyond a simple adjustment disorder.  Summary of Results:   Results of the current psychological evaluation are consistent with an individual is dealing with chronic pain as well as a number of medical issues. However, the patient's overall current psychosocial functioning and elevation in depression and anxiety are consistent with her current psychological status. While these are problematic issues that should continue to be addressed they do not appear to be elevated to a degree that would be contraindicated for the patient participating in spinal cord stimulator trialing. I do think that she should continue to be addressed for the psychological evaluation both during and after trialing and possible implantation. The patient has numerous medical issues beyond nerve root impingement or other spinal cord issues including lupus, Raynaud's disease and autoimmune disorder. The patient does appear to be clinically depressed but this appears to be a long-standing issue and not due to acute psychosocial issues. The patient also is likely having a lot of sustained sleep disturbance and excessive worrying about  her overall medical status.  Impression/Diagnosis:   The patient does appear to be a good candidate for spinal cord stimulator trialing and possible implantation. While the patient is dealing with a major depressive disorder that has lasted for more than 6 months I do think that the patient's depression and anxiety are stable at this point and while they do need to be continued to be addressed they should not be overly problematic for spinal cord stimulator trialing. There is no indication of other psychiatric disorder such as bipolar disorder or schizophrenia. The patient does have numerous medical issues beyond chronic pain and these will also need to be taken into account during her trialing phase. The patient is currently in an active flareup related to her lupus (according to the patient) and this will likely need to be dealt with before implantation but I will leave that to her treating physician.  Diagnosis:    Axis I: Lumbar post-laminectomy syndrome  Major depressive disorder, recurrent episode, moderate ([place])   Ilean Skill, Psy.D. Neuropsychologist

## 2017-09-19 NOTE — Addendum Note (Signed)
Addended by: Jacqualin Combes on: 09/19/2017 10:39 AM   Modules accepted: Orders

## 2017-09-19 NOTE — Progress Notes (Signed)
Subjective:    Patient ID: Lauren Winters, female    DOB: 1966/10/17, 51 y.o.   MRN: [id number]  HPI  Lauren Winters is a 51 year old female who presents today for follow up of abnormal urine specimen. She was at her rheumatologist's office on 09/14/17 providing a urine sample for a medication that she may be starting. She had no symptoms but was told that she had a urinary tract infection.  Her urine specimen resulted with moderate bacteria (1+ leuks), negative nitrites, negative blood. She was not provided with a pre-specimen collection wipe prior to urine sample admission. She was not treated for UTI but was rather advised to see her PCP for treatment.  Her UA in the office today is negative. She denies dysuria, hematuria, flank pain, abdominal pain, vaginal discharge.   Review of Systems  Constitutional: Negative for fever.  Gastrointestinal: Negative for abdominal pain and nausea.  Genitourinary: Negative for dysuria, flank pain, hematuria and vaginal discharge.       Past Medical History:  Diagnosis Date  . Anxiety   . Atypical chest pain    a. ETT 05/2014: normal stress test without evidence of myocardial ischemia or chest pain at peak stress  . Depression   . Diabetes mellitus without complication ([place]) metformin   dx 2015  . Fibromyalgia   . Heart murmur    told back in 2007, but no mention with 2016 cardiac w/u  . Hypertension   . Lupus (systemic lupus erythematosus) ([place])   . Mitral regurgitation    a. 05/2014 echo: EF >55%, nl LV/RV sys fxn, mild MR/AR, no valvular stenosis   . Obesity (BMI 30-39.9)   . Raynaud's phenomenon   . Rheumatoid arthritis ([place])   . Sjogren's disease ([place])   . Tobacco abuse    a. ongoing; b. ongoing SOB     Social History   Social History  . Marital status: Married    Spouse name: N/A  . Number of children: N/A  . Years of education: N/A   Occupational History  . Not on file.   Social History Main Topics  . Smoking status:  Former Smoker    Packs/day: 0.50    Years: 10.00    Types: Cigarettes    Quit date: 07/22/2015  . Smokeless tobacco: Never Used  . Alcohol use No  . Drug use: No  . Sexual activity: Not on file   Other Topics Concern  . Not on file   Social History Narrative   Married.   3 children.   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     Past Surgical History:  Procedure Laterality Date  . ABDOMINAL HYSTERECTOMY    . CESAREAN SECTION     x3  . LUMBAR LAMINECTOMY    . LUMBAR LAMINECTOMY/DECOMPRESSION MICRODISCECTOMY N/A 03/29/2017   Procedure: Re operative Right Lumbar Three-Four Laminectomy and discectomy with Left Lumbar Three-Four, Bilateral Lumbar Four-Five Laminectomy for decompression;  Surgeon: Kevan Ny Ditty, MD;  Location: [facility];  Service: Neurosurgery;  Laterality: N/A;  . TUBAL LIGATION      Family History  Problem Relation Age of Onset  . Hypertension Mother   . Valvular heart disease Mother   . CAD Father 55       CABG x 4  . Liver cancer Father 69       passed  . CAD Paternal Aunt        CABG  . Breast cancer  Paternal Aunt   . CAD Paternal Aunt        CABG  . Breast cancer Paternal Aunt   . CAD Paternal Aunt        CABG  . Breast cancer Paternal Aunt   . CAD Paternal Aunt        CABG  . Breast cancer Paternal Aunt   . CAD Paternal Aunt        CABG  . Breast cancer Paternal Aunt   . CAD Paternal Uncle        MI s/p CABG  . Stroke Maternal Grandmother   . Stroke Maternal Grandfather   . CAD Maternal Grandfather     Allergies  Allergen Reactions  . Pseudoephedrine Hcl Shortness Of Breath    Other reaction(s): Other (See Comments) Room starts spinning, can't breath  . Clindamycin/Lincomycin Hives  . Adhesive [Tape] Other (See Comments)    Sensitivity to certain band aids and adhesives - burns skin, makes skin raw  . Other     Other reaction(s): Other (See Comments) Sensitivity to certain band aids and  adhesives - burns skin, makes skin raw  . Prednisone     Other reaction(s): Other (See Comments) Chest pain w/ cortisone shots or prednisone pills Elevated blood pressure  . Sudafed [Pseudoephedrine Hcl] Other (See Comments)    Dizziness     Current Outpatient Prescriptions on File Prior to Visit  Medication Sig Dispense Refill  . amLODipine (NORVASC) 5 MG tablet Take 1 tablet (5 mg total) by mouth 2 (two) times daily. (Patient taking differently: Take 10 mg by mouth daily. ) 180 tablet 2  . buPROPion (WELLBUTRIN XL) 150 MG 24 hr tablet Take 1 tablet (150 mg total) by mouth daily. 30 tablet 5  . Clobetasol Prop Crea-[place] (CLOBETA CREAM EX) Apply 1 application topically daily as needed (skin irritation).     . cloNIDine (CATAPRES) 0.1 MG tablet Take 1 tablet (0.1 mg total) by mouth 2 (two) times daily. 180 tablet 1  . cyclobenzaprine (FLEXERIL) 10 MG tablet Take 1 tablet (10 mg total) by mouth 3 (three) times daily as needed for muscle spasms. 90 tablet 2  . diclofenac (VOLTAREN) 75 MG EC tablet   0  . diclofenac sodium (VOLTAREN) 1 % GEL Apply 1 application topically daily as needed.    . folic acid (FOLVITE) 1 MG tablet Take 2 tablets (2 mg total) by mouth daily. 180 tablet 4  . gabapentin (NEURONTIN) 300 MG capsule Take 300 mg by mouth 2 (two) times daily.   0  . glipiZIDE (GLUCOTROL) 5 MG tablet Take 1 tablet (5 mg total) by mouth 2 (two) times daily. For diabetes. 180 tablet 1  . Halcinonide 0.1 % OINT Apply 1 application topically 2 (two) times daily as needed.    Marland Kitchen HYDROcodone-acetaminophen (NORCO) 7.5-325 MG tablet Take 1-2 tablets by mouth every 4 (four) hours as needed for moderate pain. 60 tablet 0  . hydroxychloroquine (PLAQUENIL) 200 MG tablet Take 1 tablet (200 mg total) by mouth 2 (two) times daily. 60 tablet 0  . hydrOXYzine (ATARAX/VISTARIL) 25 MG tablet Take 1 tablet (25 mg total) by mouth 2 (two) times daily as needed for anxiety. 30 tablet 0  . magic mouthwash  w/lidocaine SOLN diphenhydramine/ 2%  VISCOUS lidocaine /antacid-MAALOX (1:1:1); 15 mL swished in the mouth for 30 sec, then spit out.    May be used every 3 to 4 h 120 mL 2  . metFORMIN (GLUCOPHAGE) 1000 MG tablet  Take 1 tablet (1,000 mg total) by mouth 2 (two) times daily with a meal. 180 tablet 3  . methocarbamol (ROBAXIN) 500 MG tablet TAKE 1 TABLET (500 MG TOTAL) BY MOUTH EVERY 8 (EIGHT) HOURS AS NEEDED FOR MUSCLE SPASMS.  0  . metoprolol (LOPRESSOR) 100 MG tablet Take 1 tablet (100 mg total) by mouth 2 (two) times daily. (Patient taking differently: Take 100 mg by mouth daily. ) 60 tablet 3  . pilocarpine (SALAGEN) 5 MG tablet Take 5 mg by mouth daily as needed.     . tacrolimus (PROTOPIC) 0.1 % ointment Apply 1 application topically 2 (two) times daily as needed.    . Triamcinolone Acetonide (TRIAMCINOLONE 0.1 % CREAM : EUCERIN) CREA Apply 1 application topically 2 (two) times daily as needed for rash or irritation.     . valsartan-hydrochlorothiazide (DIOVAN-HCT) 320-25 MG tablet Take 1 tablet by mouth daily. 90 tablet 3  . Vitamin D, Ergocalciferol, (DRISDOL) 50000 units CAPS capsule Take 1 capsule (50,000 Units total) by mouth 2 (two) times a week. 24 capsule 0   No current facility-administered medications on file prior to visit.     BP 122/84   Pulse 82   Temp 98 F (36.7 C) (Oral)   Ht 5\' 6"  (1.676 m)   Wt 220 lb 12.8 oz (100.2 kg)   SpO2 98%   BMI 35.64 kg/m    Objective:   Physical Exam  Constitutional: She appears well-nourished.  Neck: Neck supple.  Cardiovascular: Normal rate and regular rhythm.   Pulmonary/Chest: Effort normal and breath sounds normal.  Abdominal: There is no CVA tenderness.  Skin: Skin is warm and dry.          Assessment & Plan:  Abnormal Urine Specimen:  Noted by her rheumatologist last week. Patient had no symptoms. UA today negative. She does not have a UTI. Will culture to ensure. Suspect urine results last week were secondary to  lack of pre-specimen cleansing. Bacteria likely normal flora from skin contact with container.  She will update if she develops symptoms. Discussed to remind her rheumatologist's office to provider her with a pre-specimen wipe.   Sheral Flow, NP

## 2017-09-20 ENCOUNTER — Ambulatory Visit: Payer: BLUE CROSS/BLUE SHIELD | Admitting: Psychology

## 2017-09-20 LAB — URINE CULTURE
MICRO NUMBER: 81054280
RESULT: NO GROWTH
SPECIMEN QUALITY:: ADEQUATE

## 2017-09-21 ENCOUNTER — Encounter: Payer: Self-pay | Admitting: Primary Care

## 2017-09-21 ENCOUNTER — Telehealth: Payer: Self-pay

## 2017-09-21 DIAGNOSIS — M3509 Sicca syndrome with other organ involvement: Secondary | ICD-10-CM

## 2017-09-21 DIAGNOSIS — M3219 Other organ or system involvement in systemic lupus erythematosus: Secondary | ICD-10-CM

## 2017-09-21 DIAGNOSIS — M797 Fibromyalgia: Secondary | ICD-10-CM

## 2017-09-21 NOTE — Telephone Encounter (Signed)
A prior authorization for Lauren Winters has been submitted to patients insurance via cover my meds. Gwenlyn Perking assisted in calculating the [place] score. Documents will be submitted to scan center. Will update once we receive a response.   Seiya Silsby, [place], CPhT 9:39 AM

## 2017-09-23 NOTE — Telephone Encounter (Signed)
Received a fax from [facility] regarding a prior authorization [place] for Benlysta because the patient is not autoantibody positive as indicated above and has renal or CNS disease.   Reference number:[id number] Phone number:[phone]  Will send document to scan center.    Jolyn Deshmukh, [place], CPhT 3:59 PM

## 2017-09-26 ENCOUNTER — Other Ambulatory Visit: Payer: Self-pay | Admitting: Rheumatology

## 2017-09-26 ENCOUNTER — Encounter: Payer: Self-pay | Admitting: *Deleted

## 2017-09-26 NOTE — Telephone Encounter (Signed)
The appeal letter has been submitted to insurance.  Fax: [phone] Reference number: [id number]  Will send documents to scan center.  Called patient to update her. No answer. Had to leave a message.   Will update once we receive a response.  Deven Furia, [place], CPhT 3:27 PM

## 2017-09-26 NOTE — Telephone Encounter (Signed)
Appeal letter written

## 2017-09-27 NOTE — Telephone Encounter (Signed)
Last Visit: 07/18/17 Next Visit: 12/26/17 Labs: 07/18/17 cbc/cmp wnl except elevated glucose PLQ Eye Exam: 11/2016 WNL  Okay to refill per Dr. Estanislado Pandy

## 2017-09-28 ENCOUNTER — Encounter: Payer: Self-pay | Admitting: Rheumatology

## 2017-09-29 ENCOUNTER — Ambulatory Visit (INDEPENDENT_AMBULATORY_CARE_PROVIDER_SITE_OTHER): Payer: BLUE CROSS/BLUE SHIELD | Admitting: Psychology

## 2017-09-29 DIAGNOSIS — F331 Major depressive disorder, recurrent, moderate: Secondary | ICD-10-CM | POA: Diagnosis not present

## 2017-09-29 DIAGNOSIS — F410 Panic disorder [episodic paroxysmal anxiety] without agoraphobia: Secondary | ICD-10-CM | POA: Diagnosis not present

## 2017-09-29 NOTE — Telephone Encounter (Signed)
Called [employer] to check the status of the denial for Benlysta. Spoke with Theadora Rama who states that the authorization was initiated on 09/28/17. They have up to seven calender days to make a decision. We should have an answer by 10/03/17.  Reference: [id number]   [id number] Phone: [phone]  Called patient to update her. Patient voices understanding and denies any questions at this time.   Nyala Kirchner, [place], CPhT 9:05 AM

## 2017-10-06 ENCOUNTER — Ambulatory Visit: Payer: BLUE CROSS/BLUE SHIELD | Admitting: Psychology

## 2017-10-06 ENCOUNTER — Other Ambulatory Visit: Payer: Self-pay | Admitting: Primary Care

## 2017-10-06 DIAGNOSIS — I10 Essential (primary) hypertension: Secondary | ICD-10-CM

## 2017-10-06 NOTE — Telephone Encounter (Signed)
May consider referral to [facility] or [facility]. She may have better chances of getting medication from there as they have drug trials going on usually.

## 2017-10-06 NOTE — Addendum Note (Signed)
Addended by: Carole Binning on: 10/06/2017 02:17 PM   Modules accepted: Orders

## 2017-10-06 NOTE — Telephone Encounter (Signed)
Patient advised and referral placed to [facility].

## 2017-10-06 NOTE — Telephone Encounter (Signed)
Received a fax from [facility] regarding an appeal DENIAL for [facility].   Reference number:[id number] Phone [phone] Fax number: [phone]  Will send document to scan center.  Patient need to be notified. What is the next step?  Jamareon Shimel, [place], CPhT 9:02 AM

## 2017-10-06 NOTE — Addendum Note (Signed)
Addended by: Carole Binning on: 10/06/2017 11:41 AM   Modules accepted: Orders

## 2017-10-13 ENCOUNTER — Ambulatory Visit (INDEPENDENT_AMBULATORY_CARE_PROVIDER_SITE_OTHER): Payer: BLUE CROSS/BLUE SHIELD | Admitting: Psychology

## 2017-10-13 DIAGNOSIS — F331 Major depressive disorder, recurrent, moderate: Secondary | ICD-10-CM | POA: Diagnosis not present

## 2017-10-14 ENCOUNTER — Encounter: Payer: Self-pay | Admitting: Rheumatology

## 2017-10-18 ENCOUNTER — Ambulatory Visit: Payer: BLUE CROSS/BLUE SHIELD | Admitting: Neurology

## 2017-10-18 ENCOUNTER — Encounter: Payer: Self-pay | Admitting: Primary Care

## 2017-10-18 ENCOUNTER — Telehealth: Payer: Self-pay | Admitting: Neurology

## 2017-10-18 NOTE — Telephone Encounter (Signed)
This patient did not show for a revisit appointment today. 

## 2017-10-19 ENCOUNTER — Encounter: Payer: Self-pay | Admitting: Neurology

## 2017-10-19 ENCOUNTER — Encounter: Payer: Self-pay | Admitting: Adult Health

## 2017-10-20 ENCOUNTER — Ambulatory Visit (INDEPENDENT_AMBULATORY_CARE_PROVIDER_SITE_OTHER): Payer: BLUE CROSS/BLUE SHIELD | Admitting: Psychology

## 2017-10-20 DIAGNOSIS — F331 Major depressive disorder, recurrent, moderate: Secondary | ICD-10-CM | POA: Diagnosis not present

## 2017-10-21 ENCOUNTER — Ambulatory Visit (INDEPENDENT_AMBULATORY_CARE_PROVIDER_SITE_OTHER): Payer: BLUE CROSS/BLUE SHIELD | Admitting: Primary Care

## 2017-10-21 ENCOUNTER — Encounter: Payer: Self-pay | Admitting: Primary Care

## 2017-10-21 VITALS — BP 124/84 | HR 74 | Temp 98.1°F | Ht 66.0 in | Wt 218.1 lb

## 2017-10-21 DIAGNOSIS — F3342 Major depressive disorder, recurrent, in full remission: Secondary | ICD-10-CM | POA: Diagnosis not present

## 2017-10-21 DIAGNOSIS — G8929 Other chronic pain: Secondary | ICD-10-CM

## 2017-10-21 DIAGNOSIS — Z23 Encounter for immunization: Secondary | ICD-10-CM

## 2017-10-21 DIAGNOSIS — I10 Essential (primary) hypertension: Secondary | ICD-10-CM | POA: Diagnosis not present

## 2017-10-21 DIAGNOSIS — M3500 Sicca syndrome, unspecified: Secondary | ICD-10-CM

## 2017-10-21 DIAGNOSIS — E785 Hyperlipidemia, unspecified: Secondary | ICD-10-CM | POA: Diagnosis not present

## 2017-10-21 DIAGNOSIS — M545 Low back pain: Secondary | ICD-10-CM

## 2017-10-21 DIAGNOSIS — E119 Type 2 diabetes mellitus without complications: Secondary | ICD-10-CM

## 2017-10-21 NOTE — Assessment & Plan Note (Signed)
Following with rheumatology and dermatology.

## 2017-10-21 NOTE — Assessment & Plan Note (Signed)
Stable in the office today, continue current regimen. 

## 2017-10-21 NOTE — Assessment & Plan Note (Signed)
Due for repeat A1C in 2 weeks. Recommended to work on diet and activity level. Managed on ARB.  Pneumonia vaccination and foot exam UTD. Follow up in 6 months or sooner depending on A1C result.

## 2017-10-21 NOTE — Assessment & Plan Note (Signed)
Following with neurosurgery. MRI of thoracic and lumbar spine pending. Hives to location of spinal cord stimulator adhesive dressing. Will treat with claritin and pepcid x 1 week. She will update if no improvement.

## 2017-10-21 NOTE — Addendum Note (Signed)
Addended by: Jacqualin Combes on: 10/21/2017 05:00 PM   Modules accepted: Orders

## 2017-10-21 NOTE — Assessment & Plan Note (Signed)
Seems to be overall stable, she declines need for additional treatment at this time. Continue to monitor.

## 2017-10-21 NOTE — Progress Notes (Signed)
Subjective:    Patient ID: Lauren Winters, female    DOB: 22-Mar-1966, 51 y.o.   MRN: [id number]  HPI  [name] is a 51 year old female who presents today for follow up.  1) Essential Hypertension: Currently managed on amlodipine 5 mg BID, clonidine 0.1 mg BID, valsartan-HCTZ 320-50 mg, metoprolol 100 mg BID.  She denies chest pain, dizziness, blurry vision.   BP Readings from Last 3 Encounters:  10/21/17 124/84  09/19/17 122/84  07/27/17 (!) 142/78     2) Type 2 Diabetes:   Current medications include: Metformin 1000 mg BID and Glipizide 5 mg BID.   She is checking her blood glucose infrequently, no recent checks.   Last A1C: 8.2 in August 2018 Last Eye Exam: Due. Last Foot Exam: UTD Pneumonia Vaccination: UTD ACE/ARB: ARB Statin: None  Diet currently consists of:  Breakfast: Bacon, oatmeal, cheese toast (lower carb bread) Lunch: Soup, frozen dinners (smart ones, lean cuisine) Dinner: Frozen dinners  Snacks: Occasionally chips Desserts: None Beverages: Water, Coke Zero  Exercise: She is not exercising   3) Chronic Back and Hip Pain: Recently underwent spinal cord stimulator trial. Will be undergoing MRI of thoracic and lumbar spine. Following with neurosurgery who is on the fence about surgery vs spinal cord stimulator. She is on long term disability at this time, neurosurgery is sending approval.   She's noticed a rash to the back over the location of the adhesive that helpd her spinal cord stimulator. She first noticed itching on Wednesday two weeks ago just after the spinal cord stimulator was placed. She noticed the rash that following Sunday when the adhesive was removed. Her spinal cord stimulator was removed one week ago. She put clobetasol on the rash several times without much improvement. She has noticed slight improvement since the adhesive was removed.  4) SLE: Currently following with Rheumatology and Dermatology. She will be seeing a Rheumatologist  through [facility] for treatment.   5) Depression: Currently managed on Zoloft 100 mg. She continues to be discouraged regarding her loss of function with ADL's but is trying to keep a positive attitude.   6) Urinary Incontinency: Present for years with minor leakage. She has noticed her leakage progress over the past several months. She is wearing pads to her underwear. She's not mentioned this to her neurosurgeon. Denies loss of bowel control and numbness to the groin.    Review of Systems  Respiratory: Negative for shortness of breath.   Cardiovascular: Negative for chest pain.  Genitourinary:       Urinary incontinence  Musculoskeletal: Positive for arthralgias and back pain.  Skin: Positive for rash.  Neurological: Positive for numbness. Negative for dizziness.  Psychiatric/Behavioral:       See HPI, overall feels well managed on Zoloft.       Past Medical History:  Diagnosis Date  . Anxiety   . Atypical chest pain    a. ETT 05/2014: normal stress test without evidence of myocardial ischemia or chest pain at peak stress  . Depression   . Diabetes mellitus without complication ([place]) metformin   dx 2015  . Fibromyalgia   . Heart murmur    told back in 2007, but no mention with 2016 cardiac w/u  . Hypertension   . Lupus (systemic lupus erythematosus) ([place])   . Mitral regurgitation    a. 05/2014 echo: EF >55%, nl LV/RV sys fxn, mild MR/AR, no valvular stenosis   . Obesity (BMI 30-39.9)   .  Raynaud's phenomenon   . Rheumatoid arthritis ([place])   . Sjogren's disease ([place])   . Tobacco abuse    a. ongoing; b. ongoing SOB     Social History   Social History  . Marital status: Married    Spouse name: N/A  . Number of children: N/A  . Years of education: N/A   Occupational History  . Not on file.   Social History Main Topics  . Smoking status: Former Smoker    Packs/day: 0.50    Years: 10.00    Types: Cigarettes    Quit date: 07/22/2015  . Smokeless tobacco: Never  Used  . Alcohol use No  . Drug use: No  . Sexual activity: Not on file   Other Topics Concern  . Not on file   Social History Narrative   Married.   3 children.   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     Past Surgical History:  Procedure Laterality Date  . ABDOMINAL HYSTERECTOMY    . CESAREAN SECTION     x3  . LUMBAR LAMINECTOMY    . LUMBAR LAMINECTOMY/DECOMPRESSION MICRODISCECTOMY N/A 03/29/2017   Procedure: Re operative Right Lumbar Three-Four Laminectomy and discectomy with Left Lumbar Three-Four, Bilateral Lumbar Four-Five Laminectomy for decompression;  Surgeon: Kevan Ny Ditty, MD;  Location: [facility];  Service: Neurosurgery;  Laterality: N/A;  . TUBAL LIGATION      Family History  Problem Relation Age of Onset  . Hypertension Mother   . Valvular heart disease Mother   . CAD Father 68       CABG x 4  . Liver cancer Father 4       passed  . CAD Paternal Aunt        CABG  . Breast cancer Paternal Aunt   . CAD Paternal Aunt        CABG  . Breast cancer Paternal Aunt   . CAD Paternal Aunt        CABG  . Breast cancer Paternal Aunt   . CAD Paternal Aunt        CABG  . Breast cancer Paternal Aunt   . CAD Paternal Aunt        CABG  . Breast cancer Paternal Aunt   . CAD Paternal Uncle        MI s/p CABG  . Stroke Maternal Grandmother   . Stroke Maternal Grandfather   . CAD Maternal Grandfather     Allergies  Allergen Reactions  . Pseudoephedrine Hcl Shortness Of Breath    Other reaction(s): Other (See Comments) Room starts spinning, can't breath  . Clindamycin/Lincomycin Hives  . Adhesive [Tape] Other (See Comments)    Sensitivity to certain band aids and adhesives - burns skin, makes skin raw  . Other     Other reaction(s): Other (See Comments) Sensitivity to certain band aids and adhesives - burns skin, makes skin raw  . Prednisone     Other reaction(s): Other (See Comments) Chest pain w/ cortisone shots or  prednisone pills Elevated blood pressure  . Sudafed [Pseudoephedrine Hcl] Other (See Comments)    Dizziness     Current Outpatient Prescriptions on File Prior to Visit  Medication Sig Dispense Refill  . amLODipine (NORVASC) 5 MG tablet Take 1 tablet (5 mg total) by mouth 2 (two) times daily. (Patient taking differently: Take 10 mg by mouth daily. ) 180 tablet 2  . buPROPion (WELLBUTRIN XL) 150 MG 24 hr tablet  Take 1 tablet (150 mg total) by mouth daily. 30 tablet 5  . Clobetasol Prop Crea-Coal Tar (CLOBETA CREAM EX) Apply 1 application topically daily as needed (skin irritation).     . cloNIDine (CATAPRES) 0.1 MG tablet Take 1 tablet (0.1 mg total) by mouth 2 (two) times daily. 180 tablet 1  . cyclobenzaprine (FLEXERIL) 10 MG tablet Take 1 tablet (10 mg total) by mouth 3 (three) times daily as needed for muscle spasms. 90 tablet 2  . diclofenac (VOLTAREN) 75 MG EC tablet   0  . diclofenac sodium (VOLTAREN) 1 % GEL Apply 1 application topically daily as needed.    . folic acid (FOLVITE) 1 MG tablet Take 2 tablets (2 mg total) by mouth daily. 180 tablet 4  . gabapentin (NEURONTIN) 300 MG capsule Take 300 mg by mouth 2 (two) times daily.   0  . glipiZIDE (GLUCOTROL) 5 MG tablet Take 1 tablet (5 mg total) by mouth 2 (two) times daily. For diabetes. 180 tablet 1  . Halcinonide 0.1 % OINT Apply 1 application topically 2 (two) times daily as needed.    . hydroxychloroquine (PLAQUENIL) 200 MG tablet Take 1 tablet (200 mg total) by mouth 2 (two) times daily. 60 tablet 0  . hydroxychloroquine (PLAQUENIL) 200 MG tablet TAKE 1 TABLET (200 MG TOTAL) BY MOUTH 2 (TWO) TIMES DAILY. 60 tablet 0  . hydrOXYzine (ATARAX/VISTARIL) 25 MG tablet Take 1 tablet (25 mg total) by mouth 2 (two) times daily as needed for anxiety. 30 tablet 0  . magic mouthwash w/lidocaine SOLN diphenhydramine/ 2%  VISCOUS lidocaine /antacid-MAALOX (1:1:1); 15 mL swished in the mouth for 30 sec, then spit out.    May be used every 3 to 4 h  120 mL 2  . metFORMIN (GLUCOPHAGE) 1000 MG tablet Take 1 tablet (1,000 mg total) by mouth 2 (two) times daily with a meal. 180 tablet 3  . methocarbamol (ROBAXIN) 500 MG tablet TAKE 1 TABLET (500 MG TOTAL) BY MOUTH EVERY 8 (EIGHT) HOURS AS NEEDED FOR MUSCLE SPASMS.  0  . metoprolol (LOPRESSOR) 100 MG tablet Take 1 tablet (100 mg total) by mouth 2 (two) times daily. (Patient taking differently: Take 100 mg by mouth daily. ) 60 tablet 3  . pilocarpine (SALAGEN) 5 MG tablet Take 5 mg by mouth daily as needed.     . sertraline (ZOLOFT) 100 MG tablet   0  . tacrolimus (PROTOPIC) 0.1 % ointment Apply 1 application topically 2 (two) times daily as needed.    . Triamcinolone Acetonide (TRIAMCINOLONE 0.1 % CREAM : EUCERIN) CREA Apply 1 application topically 2 (two) times daily as needed for rash or irritation.     . valsartan-hydrochlorothiazide (DIOVAN-HCT) 320-25 MG tablet TAKE 1 TABLET BY MOUTH DAILY. 90 tablet 0  . Vitamin D, Ergocalciferol, (DRISDOL) 50000 units CAPS capsule Take 1 capsule (50,000 Units total) by mouth 2 (two) times a week. 24 capsule 0   No current facility-administered medications on file prior to visit.     BP 124/84   Pulse 74   Temp 98.1 F (36.7 C) (Oral)   Ht 5\' 6"  (1.676 m)   Wt 218 lb 1.9 oz (98.9 kg)   SpO2 97%   BMI 35.21 kg/m    Objective:   Physical Exam  Constitutional: She appears well-nourished.  Neck: Neck supple.  Cardiovascular: Normal rate and regular rhythm.   Pulmonary/Chest: Effort normal and breath sounds normal.  Musculoskeletal:  Ambulatory with cane  Skin: Skin is warm and  dry.     Hives located to lower thoracic and upper lumbar spine  Psychiatric: She has a normal mood and affect.          Assessment & Plan:

## 2017-10-21 NOTE — Patient Instructions (Addendum)
Start taking Claritin once daily and Pepcid 20 mg once daily for the rash.  Please notify me if improvement in your rash in one week.  Please schedule a physical with me in 6 months. You may also schedule a lab only appointment 3-4 days prior. We will discuss your lab results in detail during your physical.  It was a pleasure to see you today!

## 2017-10-27 IMAGING — DX DG SHOULDER 2+V*R*
3 series · 3 of 3 positions shown · non-contrast
Comparison: No recent .

CLINICAL DATA: Neck pain.  Right shoulder pain .

EXAM:
RIGHT SHOULDER - 2+ VIEW

[shoulder axial]
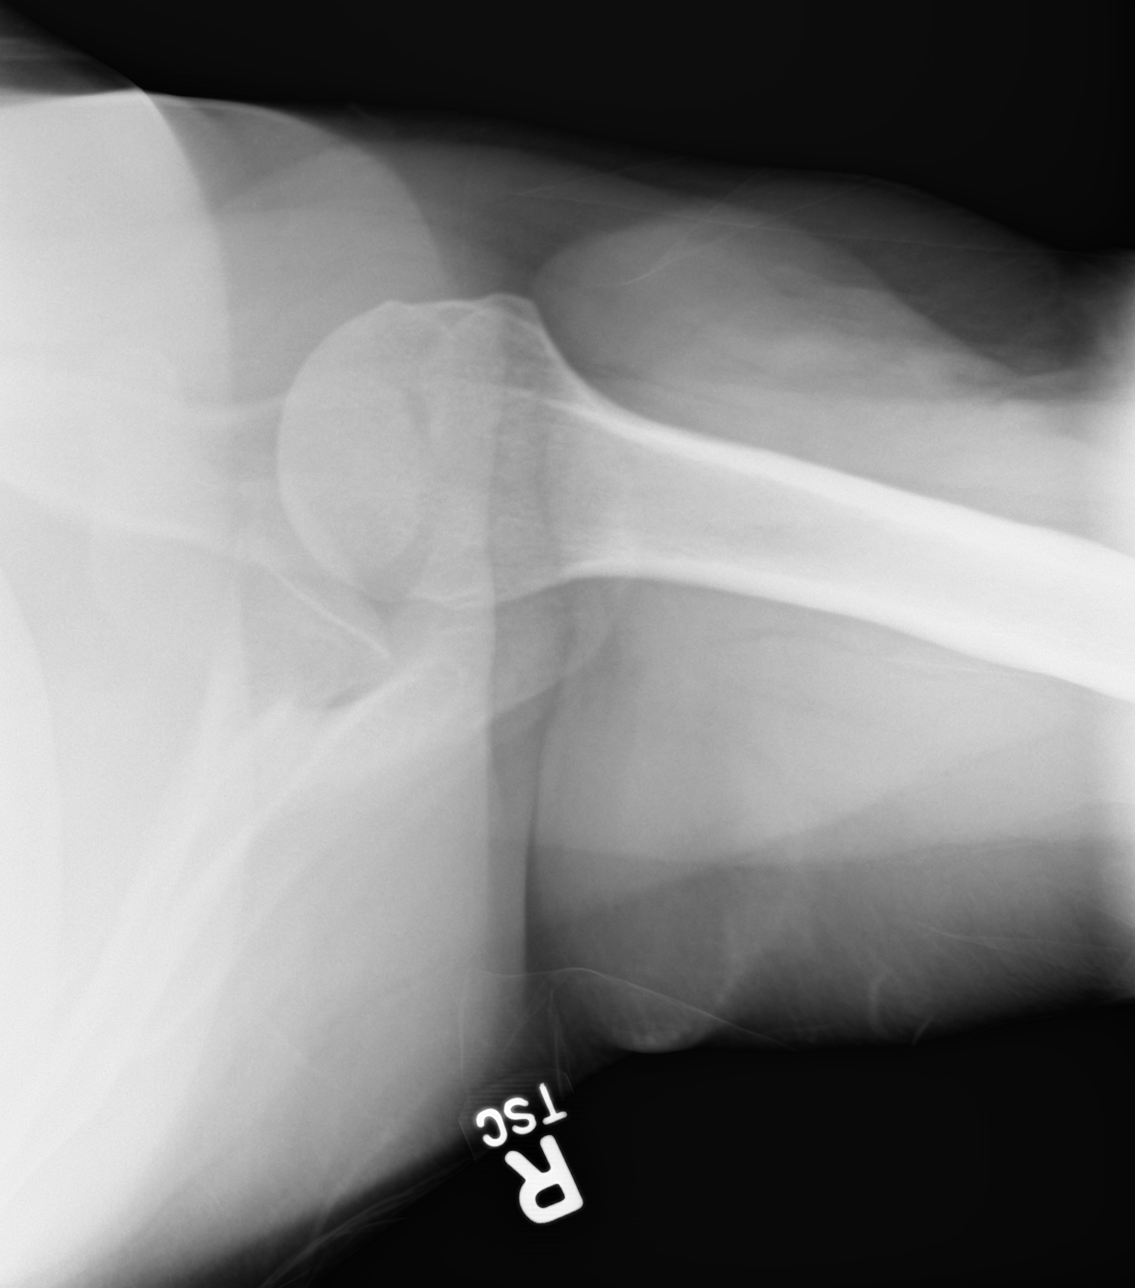

[shoulder ap]
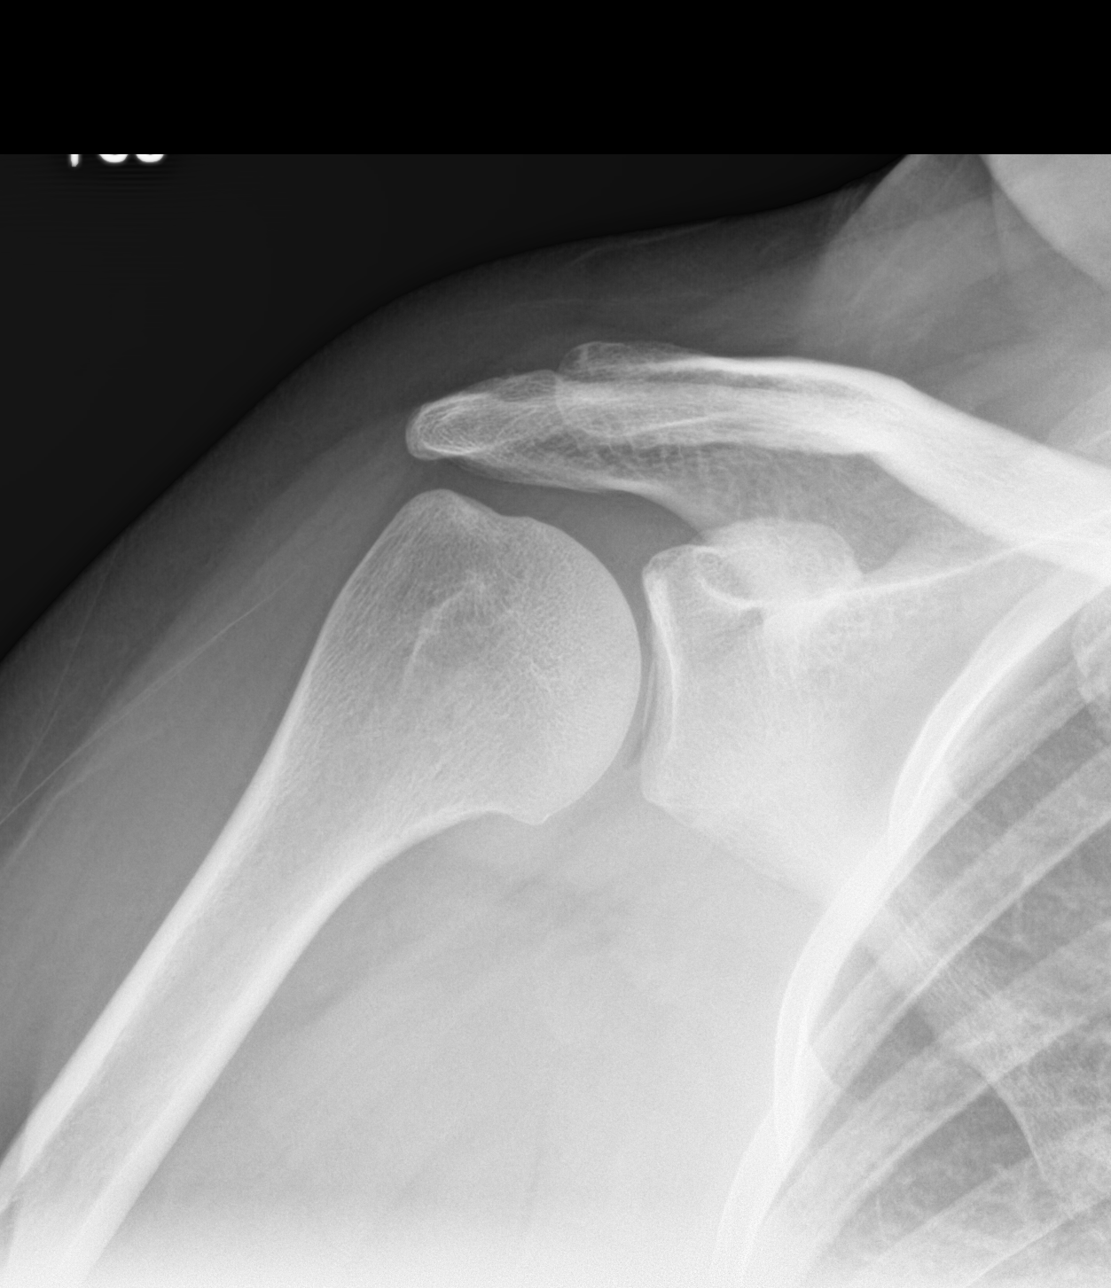

[shoulder y-view]
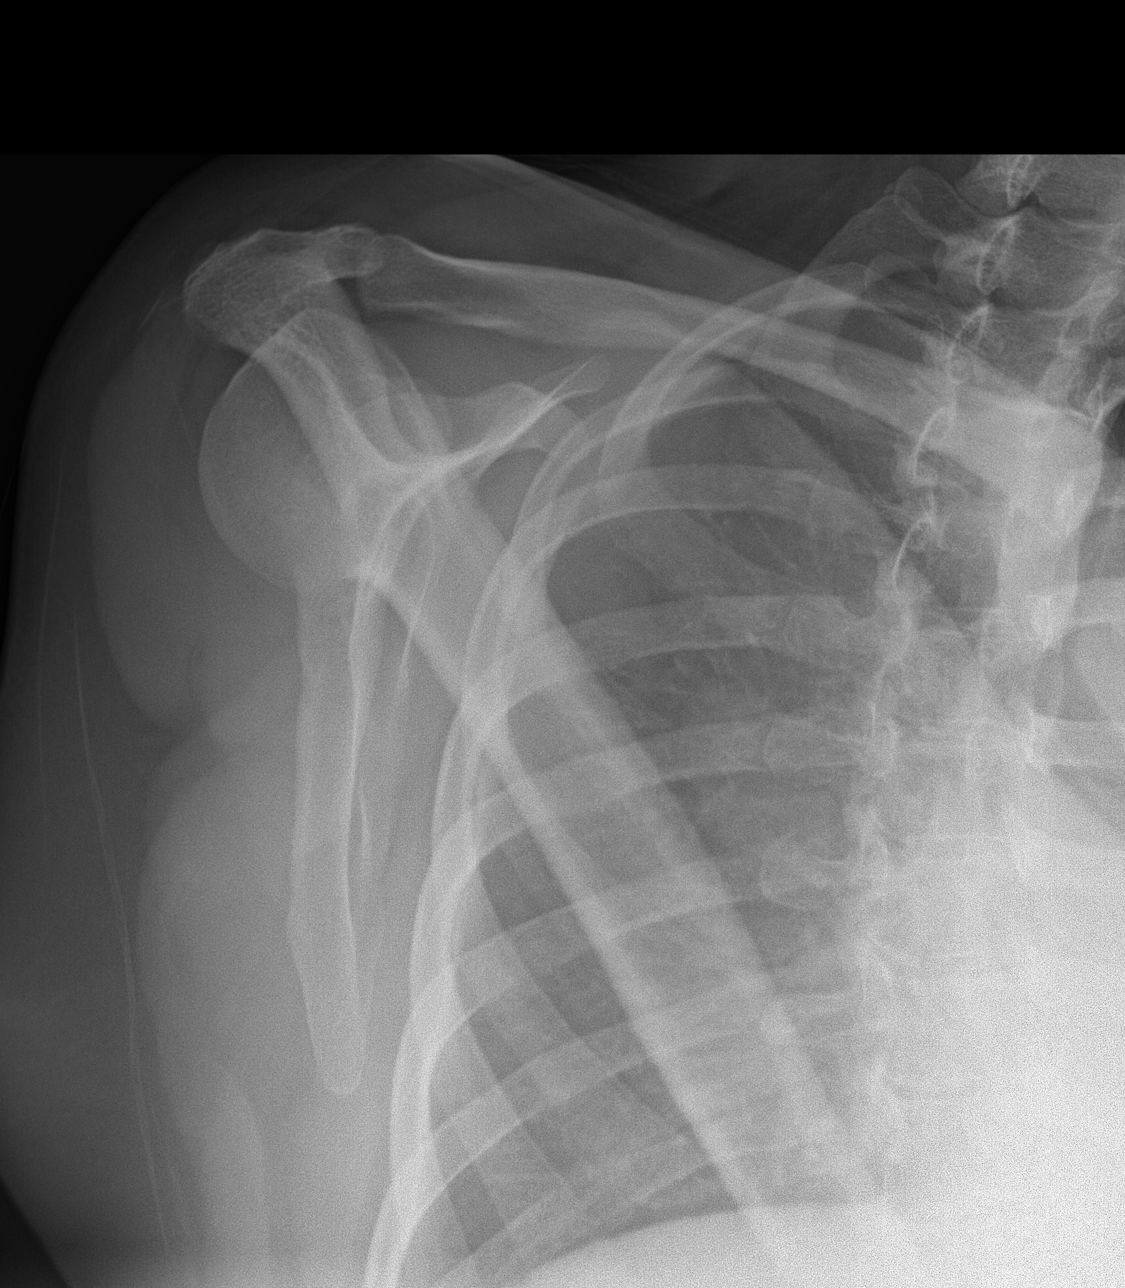

[3 of 3 positions shown; findings below may reference images not displayed]

FINDINGS: No acute bony or joint abnormality identified. No evidence of
fracture or dislocation.
IMPRESSION: No acute abnormality.

## 2017-10-30 ENCOUNTER — Encounter: Payer: Self-pay | Admitting: Primary Care

## 2017-11-03 ENCOUNTER — Ambulatory Visit: Payer: BLUE CROSS/BLUE SHIELD | Admitting: Psychology

## 2017-11-04 ENCOUNTER — Other Ambulatory Visit (INDEPENDENT_AMBULATORY_CARE_PROVIDER_SITE_OTHER): Payer: BLUE CROSS/BLUE SHIELD

## 2017-11-04 DIAGNOSIS — E119 Type 2 diabetes mellitus without complications: Secondary | ICD-10-CM | POA: Diagnosis not present

## 2017-11-04 DIAGNOSIS — E785 Hyperlipidemia, unspecified: Secondary | ICD-10-CM

## 2017-11-04 LAB — HEMOGLOBIN A1C: HEMOGLOBIN A1C: 8.3 % — AB (ref 4.6–6.5)

## 2017-11-04 LAB — LIPID PANEL
CHOLESTEROL: 164 mg/dL (ref 0–200)
HDL: 36.7 mg/dL — AB (ref >39.00)
LDL CALC: 97 mg/dL (ref 0–99)
NONHDL: 127.38
Total CHOL/HDL Ratio: 4
Triglycerides: 151 mg/dL — ABNORMAL HIGH (ref 0.0–149.0)
VLDL: 30.2 mg/dL (ref 0.0–40.0)

## 2017-11-08 ENCOUNTER — Encounter: Payer: Self-pay | Admitting: *Deleted

## 2017-11-09 MED ORDER — GLIPIZIDE 10 MG PO TABS: 10.0000 mg | ORAL_TABLET | Freq: Two times a day (BID) | ORAL | 2 refills | Status: DC

## 2017-11-10 ENCOUNTER — Ambulatory Visit (INDEPENDENT_AMBULATORY_CARE_PROVIDER_SITE_OTHER): Payer: BLUE CROSS/BLUE SHIELD | Admitting: Psychology

## 2017-11-10 DIAGNOSIS — F331 Major depressive disorder, recurrent, moderate: Secondary | ICD-10-CM | POA: Diagnosis not present

## 2017-11-24 ENCOUNTER — Ambulatory Visit: Payer: BLUE CROSS/BLUE SHIELD | Admitting: Psychology

## 2017-11-28 ENCOUNTER — Other Ambulatory Visit: Payer: Self-pay | Admitting: Neurological Surgery

## 2017-12-01 ENCOUNTER — Ambulatory Visit: Payer: BLUE CROSS/BLUE SHIELD | Admitting: Psychology

## 2017-12-05 NOTE — Pre-Procedure Instructions (Signed)
Larisa T Sova  12/05/2017      [id number] - [place], [place] - [address] [address] Phone: [phone] Fax: [phone]    Your procedure is scheduled on Friday December 14.  Report to [facility] Admitting at 5:30 A.M.  Call this number if you have problems the morning of surgery:  [phone]   Remember:  Do not eat food or drink liquids after midnight.  Take these medicines the morning of surgery with A SIP OF WATER:   Metoprolol (lopressor) Amlodipine (norvasc) Bupropion (wellbutrin) Clonidine (catapres) escitalopram (lexapro) Gabapentin (neurontin) Plaquenil Methocarbamol (robaxin)  DO NOT take glipizide (glucotrol) and metformin (glucophage) the day of surgery.   7 days prior to surgery STOP taking any Aspirin(unless otherwise instructed by your surgeon), diclofenac (voltaren)Aleve, Naproxen, Ibuprofen, Motrin, Advil, Goody's, BC's, all herbal medications, fish oil, and all vitamins     How to Manage Your Diabetes Before and After Surgery  Why is it important to control my blood sugar before and after surgery? . Improving blood sugar levels before and after surgery helps healing and can limit problems. . A way of improving blood sugar control is eating a healthy diet by: o  Eating less sugar and carbohydrates o  Increasing activity/exercise o  Talking with your doctor about reaching your blood sugar goals . High blood sugars (greater than 180 mg/dL) can raise your risk of infections and slow your recovery, so you will need to focus on controlling your diabetes during the weeks before surgery. . Make sure that the doctor who takes care of your diabetes knows about your planned surgery including the date and location.  How do I manage my blood sugar before surgery? . Check your blood sugar at least 4 times a day, starting 2 days before surgery, to make sure that the level is not too high or low. o Check your blood  sugar the morning of your surgery when you wake up and every 2 hours until you get to the Short Stay unit. . If your blood sugar is less than 70 mg/dL, you will need to treat for low blood sugar: o Do not take insulin. o Treat a low blood sugar (less than 70 mg/dL) with  cup of clear juice (cranberry or apple), 4 glucose tablets, OR glucose gel. Recheck blood sugar in 15 minutes after treatment (to make sure it is greater than 70 mg/dL). If your blood sugar is not greater than 70 mg/dL on recheck, call [phone] o  for further instructions. . Report your blood sugar to the short stay nurse when you get to Short Stay.  . If you are admitted to the hospital after surgery: o Your blood sugar will be checked by the staff and you will probably be given insulin after surgery (instead of oral diabetes medicines) to make sure you have good blood sugar levels. o The goal for blood sugar control after surgery is 80-180 mg/dL.               Do not wear jewelry, make-up or nail polish.  Do not wear lotions, powders, or perfumes, or deodorant.  Do not shave 48 hours prior to surgery.  Men may shave face and neck.  Do not bring valuables to the hospital.  [facility] is not responsible for any belongings or valuables.  Contacts, dentures or bridgework may not be worn into surgery.  Leave your suitcase in the car.  After  surgery it may be brought to your room.  For patients admitted to the hospital, discharge time will be determined by your treatment team.  Patients discharged the day of surgery will not be allowed to drive home.    Special instructions:    [facility]- Preparing For Surgery  Before surgery, you can play an important role. Because skin is not sterile, your skin needs to be as free of germs as possible. You can reduce the number of germs on your skin by washing with CHG (chlorahexidine gluconate) Soap before surgery.  CHG is an antiseptic cleaner which kills germs and  bonds with the skin to continue killing germs even after washing.  Please do not use if you have an allergy to CHG or antibacterial soaps. If your skin becomes reddened/irritated stop using the CHG.  Do not shave (including legs and underarms) for at least 48 hours prior to first CHG shower. It is OK to shave your face.  Please follow these instructions carefully.   1. Shower the NIGHT BEFORE SURGERY and the MORNING OF SURGERY with CHG.   2. If you chose to wash your hair, wash your hair first as usual with your normal shampoo.  3. After you shampoo, rinse your hair and body thoroughly to remove the shampoo.  4. Use CHG as you would any other liquid soap. You can apply CHG directly to the skin and wash gently with a scrungie or a clean washcloth.   5. Apply the CHG Soap to your body ONLY FROM THE NECK DOWN.  Do not use on open wounds or open sores. Avoid contact with your eyes, ears, mouth and genitals (private parts). Wash Face and genitals (private parts)  with your normal soap.  6. Wash thoroughly, paying special attention to the area where your surgery will be performed.  7. Thoroughly rinse your body with warm water from the neck down.  8. DO NOT shower/wash with your normal soap after using and rinsing off the CHG Soap.  9. Pat yourself dry with a CLEAN TOWEL.  10. Wear CLEAN PAJAMAS to bed the night before surgery, wear comfortable clothes the morning of surgery  11. Place CLEAN SHEETS on your bed the night of your first shower and DO NOT SLEEP WITH PETS.    Day of Surgery: Do not apply any deodorants/lotions. Please wear clean clothes to the hospital/surgery center.      Please read over the following fact sheets that you were given. Coughing and Deep Breathing, MRSA Information and Surgical Site Infection Prevention

## 2017-12-06 ENCOUNTER — Encounter (HOSPITAL_COMMUNITY)
Admission: RE | Admit: 2017-12-06 | Discharge: 2017-12-06 | Disposition: A | Discharge status: Home / Self Care | Payer: BLUE CROSS/BLUE SHIELD | Source: Ambulatory Visit | Attending: Neurological Surgery | Admitting: Neurological Surgery

## 2017-12-06 ENCOUNTER — Other Ambulatory Visit: Payer: Self-pay

## 2017-12-06 ENCOUNTER — Encounter (HOSPITAL_COMMUNITY): Payer: Self-pay

## 2017-12-06 ENCOUNTER — Telehealth: Payer: Self-pay

## 2017-12-06 DIAGNOSIS — Z01812 Encounter for preprocedural laboratory examination: Secondary | ICD-10-CM | POA: Insufficient documentation

## 2017-12-06 DIAGNOSIS — M961 Postlaminectomy syndrome, not elsewhere classified: Secondary | ICD-10-CM | POA: Diagnosis present

## 2017-12-06 HISTORY — DX: Headache: R51

## 2017-12-06 HISTORY — DX: Headache, unspecified: R51.9

## 2017-12-06 HISTORY — DX: Adverse effect of unspecified anesthetic, initial encounter: T41.45XA

## 2017-12-06 HISTORY — DX: Other complications of anesthesia, initial encounter: T88.59XA

## 2017-12-06 HISTORY — DX: Dyspnea, unspecified: R06.00

## 2017-12-06 LAB — BASIC METABOLIC PANEL
Anion gap: 10 (ref 5–15)
BUN: 12 mg/dL (ref 6–20)
CALCIUM: 8.9 mg/dL (ref 8.9–10.3)
CO2: 25 mmol/L (ref 22–32)
CREATININE: 0.85 mg/dL (ref 0.44–1.00)
Chloride: 98 mmol/L — ABNORMAL LOW (ref 101–111)
GFR calc Af Amer: >60 mL/min (ref >60)
GLUCOSE: 360 mg/dL — AB (ref 65–99)
Potassium: 4 mmol/L (ref 3.5–5.1)
Sodium: 133 mmol/L — ABNORMAL LOW (ref 135–145)

## 2017-12-06 LAB — CBC WITH DIFFERENTIAL/PLATELET
BASOS ABS: 0 10*3/uL (ref 0.0–0.1)
BASOS PCT: 0 %
EOS ABS: 0.1 10*3/uL (ref 0.0–0.7)
EOS PCT: 1 %
HEMATOCRIT: 39 % (ref 36.0–46.0)
Hemoglobin: 13.4 g/dL (ref 12.0–15.0)
Lymphocytes Relative: 21 %
Lymphs Abs: 1.7 10*3/uL (ref 0.7–4.0)
MCH: 28.4 pg (ref 26.0–34.0)
MCHC: 34.4 g/dL (ref 30.0–36.0)
MCV: 82.6 fL (ref 78.0–100.0)
MONO ABS: 0.3 10*3/uL (ref 0.1–1.0)
MONOS PCT: 4 %
Neutro Abs: 5.8 10*3/uL (ref 1.7–7.7)
Neutrophils Relative %: 74 %
PLATELETS: 291 10*3/uL (ref 150–400)
RBC: 4.72 MIL/uL (ref 3.87–5.11)
RDW: 13.1 % (ref 11.5–15.5)
WBC: 7.9 10*3/uL (ref 4.0–10.5)

## 2017-12-06 LAB — GLUCOSE, CAPILLARY: GLUCOSE-CAPILLARY: 346 mg/dL — AB (ref 65–99)

## 2017-12-06 LAB — SURGICAL PCR SCREEN
MRSA, PCR: NEGATIVE
STAPHYLOCOCCUS AUREUS: POSITIVE — AB

## 2017-12-06 LAB — PROTIME-INR
INR: 0.99
PROTHROMBIN TIME: 13 s (ref 11.4–15.2)

## 2017-12-06 NOTE — Progress Notes (Signed)
PCP: T[facility] Helper Rheumatologist: Dr. Laurena Bering- pt. Will contact her if she needs to stop methotrexate and plaquenil Cardiologist: Dr. Serafina Royals @ [facility]--will request ekg tracing  Fasting sugars--pt. States she hasn't been checking them. Educated pt. On importance of checking them prior to surgery and if they remain high to contact Alma Friendly, NP.

## 2017-12-06 NOTE — Telephone Encounter (Signed)
Patient is having surgery on Friday 12/09/17 and Pre-OP would like to know if she needs to stop taking MTX and PLQ and if so, then how long?  CB# [phone].  Please advise.  Thank you.

## 2017-12-07 NOTE — Progress Notes (Signed)
Anesthesia Chart Review:  Pt is a 51 year old female scheduled for lumbar spinal cord stimulator implant on 12/09/2017 with Sherley Bounds, MD  - PCP is Alma Friendly, NP who is aware pt was considering surgery.   - Saw cardiologist Serafina Royals, MD this past summer for SOB, pre-op eval (pt was considering hip replacment). Nuclear stress test ordered, results below; pt was cleared for surgery (which does not seem to have happened). (Notes in care everywhere)   PMH includes:  HTN, heart murmur, DM, lupus, RA, Raynaud's, Sjogren's. Former smoker. BMI 36. S/p lumbar laminectomy 03/29/17  Medications include: Amlodipine, clonidine, Plaquenil, metformin, methotrexate, metoprolol, valsartan-HCTZ  Preoperative labs reviewed.   - Glucose 360. HbA1c 8.3 on 11/04/17.   CXR 12/06/17: No active cardiopulmonary disease.  EKG 03/21/17: NSR. Minimal voltage criteria for LVH, may be normal variant  Nuclear stress test 08/08/17 (care everywhere):  - Normal Lexiscan infusion EKG - Normal myocardial perfusion without evidence of myocardial ischemia. EF 63%.   Echo 07/31/15:  - Left ventricle: The cavity size was normal. Systolic function wasnormal. The estimated ejection fraction was in the range of 60%to 65%. Wall motion was normal; there were no regional wallmotion abnormalities. Doppler parameters are consistent withabnormal left ventricular relaxation (grade 1 diastolicdysfunction). - Aortic valve: There was mild regurgitation. - Mitral valve: There was mild regurgitation. - Left atrium: The atrium was mildly dilated. - Right ventricle: Systolic function was normal. - Pulmonary arteries: Systolic pressure was within the normalrange.  If glucose acceptable day of surgery, I anticipate pt can proceed with surgery as scheduled.   Willeen Cass, FNP-BC [facility] Short Stay Surgical Center/Anesthesiology Phone: [phone] 12/07/2017 10:16 AM

## 2017-12-07 NOTE — Telephone Encounter (Signed)
Patient had her last dose on MTX was last night 12/06/17. Patient is having a Spinal cord stimulator placed. Patient wants to know how long she should hold her MTX and if she is okay to go ahead with surgery due to last dose of MTX being on 12/06/17.

## 2017-12-07 NOTE — Telephone Encounter (Signed)
We recommend to hold methotrexate 1 week prior to the surgery

## 2017-12-08 ENCOUNTER — Ambulatory Visit: Payer: BLUE CROSS/BLUE SHIELD | Admitting: Psychology

## 2017-12-08 NOTE — Telephone Encounter (Signed)
Attempted to contact the patient and left message for patient to call the office.  

## 2017-12-08 NOTE — Telephone Encounter (Signed)
Patient advised of recommendations and verbalized understanding.  

## 2017-12-08 NOTE — Anesthesia Preprocedure Evaluation (Signed)
Anesthesia Evaluation  Patient identified by MRN, date of birth, ID band Patient awake    Reviewed: Allergy & Precautions, H&P , Patient's Chart, lab work & pertinent test results, reviewed documented beta blocker date and time   Airway Mallampati: II  TM Distance: >3 FB Neck ROM: full    Dental no notable dental hx.    Pulmonary former smoker,    Pulmonary exam normal breath sounds clear to auscultation       Cardiovascular hypertension,  Rhythm:regular Rate:Normal     Neuro/Psych    GI/Hepatic   Endo/Other  diabetes  Renal/GU      Musculoskeletal   Abdominal   Peds  Hematology   Anesthesia Other Findings   Reproductive/Obstetrics                             Anesthesia Physical Anesthesia Plan  ASA: II  Anesthesia Plan: General   Post-op Pain Management:    Induction: Intravenous  PONV Risk Score and Plan: 2 and Dexamethasone, Ondansetron and Treatment may vary due to age or medical condition  Airway Management Planned: Oral ETT  Additional Equipment:   Intra-op Plan:   Post-operative Plan: Extubation in OR  Informed Consent: I have reviewed the patients History and Physical, chart, labs and discussed the procedure including the risks, benefits and alternatives for the proposed anesthesia with the patient or authorized representative who has indicated his/her understanding and acceptance.   Dental Advisory Given  Plan Discussed with: CRNA and Surgeon  Anesthesia Plan Comments: (  )        Anesthesia Quick Evaluation

## 2017-12-09 ENCOUNTER — Observation Stay (HOSPITAL_COMMUNITY)
Admission: RE | Admit: 2017-12-09 | Discharge: 2017-12-13 | Disposition: A | Discharge status: Home Health | Payer: BLUE CROSS/BLUE SHIELD | Source: Ambulatory Visit | Attending: Neurological Surgery | Admitting: Neurological Surgery

## 2017-12-09 ENCOUNTER — Ambulatory Visit (HOSPITAL_COMMUNITY): Payer: BLUE CROSS/BLUE SHIELD | Admitting: Emergency Medicine

## 2017-12-09 ENCOUNTER — Other Ambulatory Visit: Payer: Self-pay

## 2017-12-09 ENCOUNTER — Encounter (HOSPITAL_COMMUNITY)
Admission: RE | Disposition: A | Discharge status: Home Health | Payer: Self-pay | Source: Ambulatory Visit | Attending: Neurological Surgery

## 2017-12-09 ENCOUNTER — Encounter (HOSPITAL_COMMUNITY): Payer: Self-pay | Admitting: Certified Registered Nurse Anesthetist

## 2017-12-09 ENCOUNTER — Ambulatory Visit (HOSPITAL_COMMUNITY): Payer: BLUE CROSS/BLUE SHIELD | Admitting: Certified Registered Nurse Anesthetist

## 2017-12-09 ENCOUNTER — Ambulatory Visit (HOSPITAL_COMMUNITY): Payer: BLUE CROSS/BLUE SHIELD

## 2017-12-09 DIAGNOSIS — Z8249 Family history of ischemic heart disease and other diseases of the circulatory system: Secondary | ICD-10-CM | POA: Diagnosis not present

## 2017-12-09 DIAGNOSIS — Z7984 Long term (current) use of oral hypoglycemic drugs: Secondary | ICD-10-CM | POA: Insufficient documentation

## 2017-12-09 DIAGNOSIS — Z823 Family history of stroke: Secondary | ICD-10-CM | POA: Insufficient documentation

## 2017-12-09 DIAGNOSIS — I10 Essential (primary) hypertension: Secondary | ICD-10-CM | POA: Insufficient documentation

## 2017-12-09 DIAGNOSIS — E669 Obesity, unspecified: Secondary | ICD-10-CM | POA: Diagnosis not present

## 2017-12-09 DIAGNOSIS — Z9889 Other specified postprocedural states: Secondary | ICD-10-CM

## 2017-12-09 DIAGNOSIS — Z800 Family history of malignant neoplasm of digestive organs: Secondary | ICD-10-CM | POA: Diagnosis not present

## 2017-12-09 DIAGNOSIS — Z881 Allergy status to other antibiotic agents status: Secondary | ICD-10-CM | POA: Diagnosis not present

## 2017-12-09 DIAGNOSIS — M069 Rheumatoid arthritis, unspecified: Secondary | ICD-10-CM | POA: Insufficient documentation

## 2017-12-09 DIAGNOSIS — M797 Fibromyalgia: Secondary | ICD-10-CM | POA: Diagnosis not present

## 2017-12-09 DIAGNOSIS — Z6837 Body mass index (BMI) 37.0-37.9, adult: Secondary | ICD-10-CM | POA: Insufficient documentation

## 2017-12-09 DIAGNOSIS — Z803 Family history of malignant neoplasm of breast: Secondary | ICD-10-CM | POA: Insufficient documentation

## 2017-12-09 DIAGNOSIS — M961 Postlaminectomy syndrome, not elsewhere classified: Principal | ICD-10-CM | POA: Insufficient documentation

## 2017-12-09 DIAGNOSIS — I7300 Raynaud's syndrome without gangrene: Secondary | ICD-10-CM | POA: Insufficient documentation

## 2017-12-09 DIAGNOSIS — Z87891 Personal history of nicotine dependence: Secondary | ICD-10-CM | POA: Diagnosis not present

## 2017-12-09 DIAGNOSIS — M3500 Sicca syndrome, unspecified: Secondary | ICD-10-CM | POA: Insufficient documentation

## 2017-12-09 DIAGNOSIS — F419 Anxiety disorder, unspecified: Secondary | ICD-10-CM | POA: Insufficient documentation

## 2017-12-09 DIAGNOSIS — Z419 Encounter for procedure for purposes other than remedying health state, unspecified: Secondary | ICD-10-CM

## 2017-12-09 DIAGNOSIS — M329 Systemic lupus erythematosus, unspecified: Secondary | ICD-10-CM | POA: Diagnosis not present

## 2017-12-09 DIAGNOSIS — F329 Major depressive disorder, single episode, unspecified: Secondary | ICD-10-CM | POA: Insufficient documentation

## 2017-12-09 DIAGNOSIS — Z91048 Other nonmedicinal substance allergy status: Secondary | ICD-10-CM | POA: Insufficient documentation

## 2017-12-09 DIAGNOSIS — Z79899 Other long term (current) drug therapy: Secondary | ICD-10-CM | POA: Insufficient documentation

## 2017-12-09 DIAGNOSIS — E119 Type 2 diabetes mellitus without complications: Secondary | ICD-10-CM | POA: Insufficient documentation

## 2017-12-09 DIAGNOSIS — Z888 Allergy status to other drugs, medicaments and biological substances status: Secondary | ICD-10-CM | POA: Diagnosis not present

## 2017-12-09 DIAGNOSIS — I340 Nonrheumatic mitral (valve) insufficiency: Secondary | ICD-10-CM | POA: Insufficient documentation

## 2017-12-09 HISTORY — PX: SPINAL CORD STIMULATOR INSERTION: SHX5378

## 2017-12-09 HISTORY — DX: Other specified postprocedural states: Z98.890

## 2017-12-09 LAB — GLUCOSE, CAPILLARY
GLUCOSE-CAPILLARY: 247 mg/dL — AB (ref 65–99)
Glucose-Capillary: 216 mg/dL — ABNORMAL HIGH (ref 65–99)
Glucose-Capillary: 264 mg/dL — ABNORMAL HIGH (ref 65–99)
Glucose-Capillary: 248 mg/dL — ABNORMAL HIGH (ref 65–99)
Glucose-Capillary: 101 mg/dL — ABNORMAL HIGH (ref 65–99)

## 2017-12-09 SURGERY — INSERTION, SPINAL CORD STIMULATOR, LUMBAR
Anesthesia: General | Site: Back

## 2017-12-09 MED ORDER — METHOCARBAMOL 1000 MG/10ML IJ SOLN
500.0000 mg | Freq: Four times a day (QID) | INTRAVENOUS | Status: DC | PRN
  Filled 2017-12-09: qty 5

## 2017-12-09 MED ORDER — SUGAMMADEX SODIUM 200 MG/2ML IV SOLN
INTRAVENOUS | Status: DC | PRN
  Administered 2017-12-09: 200 mg via INTRAVENOUS

## 2017-12-09 MED ORDER — HYDROXYCHLOROQUINE SULFATE 200 MG PO TABS
200.0000 mg | ORAL_TABLET | Freq: Two times a day (BID) | ORAL | Status: DC
  Administered 2017-12-09 – 2017-12-13 (×9): 200 mg via ORAL
  Filled 2017-12-09 (×9): qty 1

## 2017-12-09 MED ORDER — HYDROMORPHONE HCL 1 MG/ML IJ SOLN
INTRAMUSCULAR | Status: AC
  Administered 2017-12-09: 0.5 mg via INTRAVENOUS
  Filled 2017-12-09: qty 1

## 2017-12-09 MED ORDER — BUPIVACAINE HCL (PF) 0.25 % IJ SOLN
INTRAMUSCULAR | Status: DC | PRN
  Administered 2017-12-09: 8 mL

## 2017-12-09 MED ORDER — SODIUM CHLORIDE 0.9% FLUSH: 3.0000 mL | INTRAVENOUS | Status: DC | PRN

## 2017-12-09 MED ORDER — MENTHOL 3 MG MT LOZG: 1.0000 | LOZENGE | OROMUCOSAL | Status: DC | PRN

## 2017-12-09 MED ORDER — CLONIDINE HCL 0.1 MG PO TABS
0.1000 mg | ORAL_TABLET | Freq: Two times a day (BID) | ORAL | Status: DC
  Administered 2017-12-11 – 2017-12-12 (×2): 0.1 mg via ORAL
  Filled 2017-12-09 (×6): qty 1

## 2017-12-09 MED ORDER — SODIUM CHLORIDE 0.9% FLUSH
3.0000 mL | Freq: Two times a day (BID) | INTRAVENOUS | Status: DC
  Administered 2017-12-09 – 2017-12-11 (×2): 3 mL via INTRAVENOUS

## 2017-12-09 MED ORDER — MORPHINE SULFATE (PF) 4 MG/ML IV SOLN: 2.0000 mg | INTRAVENOUS | Status: DC | PRN

## 2017-12-09 MED ORDER — INSULIN ASPART 100 UNIT/ML ~~LOC~~ SOLN
0.0000 [IU] | Freq: Three times a day (TID) | SUBCUTANEOUS | Status: DC
  Administered 2017-12-09 (×2): 8 [IU] via SUBCUTANEOUS
  Administered 2017-12-10 – 2017-12-12 (×4): 3 [IU] via SUBCUTANEOUS

## 2017-12-09 MED ORDER — BUPROPION HCL ER (XL) 150 MG PO TB24
150.0000 mg | ORAL_TABLET | Freq: Every day | ORAL | Status: DC
  Administered 2017-12-10 – 2017-12-13 (×4): 150 mg via ORAL
  Filled 2017-12-09 (×4): qty 1

## 2017-12-09 MED ORDER — ACETAMINOPHEN 650 MG RE SUPP: 650.0000 mg | RECTAL | Status: DC | PRN

## 2017-12-09 MED ORDER — POTASSIUM CHLORIDE IN NACL 20-0.9 MEQ/L-% IV SOLN: INTRAVENOUS | Status: DC

## 2017-12-09 MED ORDER — METOPROLOL TARTRATE 50 MG PO TABS
100.0000 mg | ORAL_TABLET | Freq: Two times a day (BID) | ORAL | Status: DC
  Administered 2017-12-09 – 2017-12-12 (×4): 100 mg via ORAL
  Filled 2017-12-09: qty 2
  Filled 2017-12-09 (×4): qty 1
  Filled 2017-12-09: qty 2
  Filled 2017-12-09 (×2): qty 1

## 2017-12-09 MED ORDER — PHENYLEPHRINE 40 MCG/ML (10ML) SYRINGE FOR IV PUSH (FOR BLOOD PRESSURE SUPPORT)
PREFILLED_SYRINGE | INTRAVENOUS | Status: DC | PRN
  Administered 2017-12-09: 80 ug via INTRAVENOUS
  Administered 2017-12-09: 120 ug via INTRAVENOUS

## 2017-12-09 MED ORDER — THROMBIN (RECOMBINANT) 5000 UNITS EX SOLR
OROMUCOSAL | Status: DC | PRN
  Administered 2017-12-09: 5 mL via TOPICAL

## 2017-12-09 MED ORDER — HYDROCHLOROTHIAZIDE 25 MG PO TABS
25.0000 mg | ORAL_TABLET | Freq: Every day | ORAL | Status: DC
  Administered 2017-12-09 – 2017-12-11 (×2): 25 mg via ORAL
  Filled 2017-12-09 (×4): qty 1

## 2017-12-09 MED ORDER — HEMOSTATIC AGENTS (NO CHARGE) OPTIME
TOPICAL | Status: DC | PRN
  Administered 2017-12-09: 1 via TOPICAL

## 2017-12-09 MED ORDER — THROMBIN (RECOMBINANT) 5000 UNITS EX SOLR
CUTANEOUS | Status: AC
  Filled 2017-12-09: qty 5000

## 2017-12-09 MED ORDER — VANCOMYCIN HCL 1000 MG IV SOLR
INTRAVENOUS | Status: AC
  Filled 2017-12-09: qty 1000

## 2017-12-09 MED ORDER — VALSARTAN-HYDROCHLOROTHIAZIDE 320-25 MG PO TABS: 1.0000 | ORAL_TABLET | Freq: Every day | ORAL | Status: DC

## 2017-12-09 MED ORDER — ROCURONIUM BROMIDE 10 MG/ML (PF) SYRINGE
PREFILLED_SYRINGE | INTRAVENOUS | Status: AC
  Filled 2017-12-09: qty 5

## 2017-12-09 MED ORDER — ONDANSETRON HCL 4 MG/2ML IJ SOLN: 4.0000 mg | Freq: Four times a day (QID) | INTRAMUSCULAR | Status: DC | PRN

## 2017-12-09 MED ORDER — PROPOFOL 10 MG/ML IV BOLUS
INTRAVENOUS | Status: DC | PRN
  Administered 2017-12-09: 150 mg via INTRAVENOUS
  Administered 2017-12-09: 50 mg via INTRAVENOUS

## 2017-12-09 MED ORDER — ONDANSETRON HCL 4 MG/2ML IJ SOLN
INTRAMUSCULAR | Status: DC | PRN
  Administered 2017-12-09: 4 mg via INTRAVENOUS

## 2017-12-09 MED ORDER — ESCITALOPRAM OXALATE 10 MG PO TABS
20.0000 mg | ORAL_TABLET | Freq: Every day | ORAL | Status: DC
  Administered 2017-12-10 – 2017-12-13 (×4): 20 mg via ORAL
  Filled 2017-12-09 (×3): qty 2
  Filled 2017-12-09: qty 1

## 2017-12-09 MED ORDER — CEFAZOLIN SODIUM-DEXTROSE 2-4 GM/100ML-% IV SOLN
2.0000 g | Freq: Three times a day (TID) | INTRAVENOUS | Status: AC
  Administered 2017-12-09 (×2): 2 g via INTRAVENOUS
  Filled 2017-12-09 (×2): qty 100

## 2017-12-09 MED ORDER — SODIUM CHLORIDE 0.9 % IV SOLN: 250.0000 mL | INTRAVENOUS | Status: DC

## 2017-12-09 MED ORDER — GABAPENTIN 300 MG PO CAPS
600.0000 mg | ORAL_CAPSULE | Freq: Three times a day (TID) | ORAL | Status: DC
  Administered 2017-12-09 – 2017-12-13 (×12): 600 mg via ORAL
  Filled 2017-12-09 (×12): qty 2

## 2017-12-09 MED ORDER — SODIUM CHLORIDE 0.9 % IR SOLN
Status: DC | PRN
  Administered 2017-12-09: 500 mL

## 2017-12-09 MED ORDER — CHLORHEXIDINE GLUCONATE CLOTH 2 % EX PADS: 6.0000 | MEDICATED_PAD | Freq: Once | CUTANEOUS | Status: DC

## 2017-12-09 MED ORDER — ONDANSETRON HCL 4 MG/2ML IJ SOLN
INTRAMUSCULAR | Status: AC
  Filled 2017-12-09: qty 2

## 2017-12-09 MED ORDER — METFORMIN HCL 500 MG PO TABS
1000.0000 mg | ORAL_TABLET | Freq: Two times a day (BID) | ORAL | Status: DC
  Administered 2017-12-09 – 2017-12-13 (×9): 1000 mg via ORAL
  Filled 2017-12-09 (×9): qty 2

## 2017-12-09 MED ORDER — CEFAZOLIN SODIUM-DEXTROSE 2-4 GM/100ML-% IV SOLN
2.0000 g | INTRAVENOUS | Status: AC
  Administered 2017-12-09: 2 g via INTRAVENOUS
  Filled 2017-12-09: qty 100

## 2017-12-09 MED ORDER — 0.9 % SODIUM CHLORIDE (POUR BTL) OPTIME
TOPICAL | Status: DC | PRN
  Administered 2017-12-09: 1000 mL

## 2017-12-09 MED ORDER — DEXAMETHASONE SODIUM PHOSPHATE 10 MG/ML IJ SOLN
INTRAMUSCULAR | Status: DC | PRN
  Administered 2017-12-09: 5 mg via INTRAVENOUS

## 2017-12-09 MED ORDER — GLIPIZIDE 5 MG PO TABS
10.0000 mg | ORAL_TABLET | Freq: Two times a day (BID) | ORAL | Status: DC
  Administered 2017-12-09 – 2017-12-13 (×8): 10 mg via ORAL
  Filled 2017-12-09: qty 1
  Filled 2017-12-09 (×2): qty 2
  Filled 2017-12-09: qty 1
  Filled 2017-12-09 (×4): qty 2

## 2017-12-09 MED ORDER — THROMBIN (RECOMBINANT) 5000 UNITS EX SOLR
CUTANEOUS | Status: AC
  Filled 2017-12-09: qty 10000

## 2017-12-09 MED ORDER — BUPIVACAINE HCL (PF) 0.25 % IJ SOLN
INTRAMUSCULAR | Status: AC
  Filled 2017-12-09: qty 30

## 2017-12-09 MED ORDER — CELECOXIB 200 MG PO CAPS
200.0000 mg | ORAL_CAPSULE | Freq: Two times a day (BID) | ORAL | Status: DC
  Administered 2017-12-09 – 2017-12-13 (×9): 200 mg via ORAL
  Filled 2017-12-09 (×9): qty 1

## 2017-12-09 MED ORDER — IRBESARTAN 300 MG PO TABS
300.0000 mg | ORAL_TABLET | Freq: Every day | ORAL | Status: DC
  Administered 2017-12-09 – 2017-12-11 (×3): 300 mg via ORAL
  Filled 2017-12-09 (×5): qty 1

## 2017-12-09 MED ORDER — METHOCARBAMOL 500 MG PO TABS
ORAL_TABLET | ORAL | Status: AC
  Filled 2017-12-09: qty 1

## 2017-12-09 MED ORDER — HYDROMORPHONE HCL 1 MG/ML IJ SOLN
0.2500 mg | INTRAMUSCULAR | Status: DC | PRN
  Administered 2017-12-09 (×4): 0.5 mg via INTRAVENOUS

## 2017-12-09 MED ORDER — AMLODIPINE BESYLATE 5 MG PO TABS
5.0000 mg | ORAL_TABLET | Freq: Two times a day (BID) | ORAL | Status: DC
  Administered 2017-12-11 – 2017-12-12 (×2): 5 mg via ORAL
  Filled 2017-12-09 (×7): qty 1

## 2017-12-09 MED ORDER — DEXAMETHASONE SODIUM PHOSPHATE 10 MG/ML IJ SOLN
INTRAMUSCULAR | Status: AC
  Filled 2017-12-09: qty 1

## 2017-12-09 MED ORDER — PHENYLEPHRINE HCL 10 MG/ML IJ SOLN
INTRAVENOUS | Status: DC | PRN
  Administered 2017-12-09: 25 ug/min via INTRAVENOUS

## 2017-12-09 MED ORDER — LIDOCAINE 2% (20 MG/ML) 5 ML SYRINGE
INTRAMUSCULAR | Status: DC | PRN
  Administered 2017-12-09: 60 mg via INTRAVENOUS

## 2017-12-09 MED ORDER — ROCURONIUM BROMIDE 10 MG/ML (PF) SYRINGE
PREFILLED_SYRINGE | INTRAVENOUS | Status: DC | PRN
  Administered 2017-12-09: 30 mg via INTRAVENOUS
  Administered 2017-12-09: 20 mg via INTRAVENOUS
  Administered 2017-12-09: 50 mg via INTRAVENOUS

## 2017-12-09 MED ORDER — MIDAZOLAM HCL 5 MG/5ML IJ SOLN
INTRAMUSCULAR | Status: DC | PRN
  Administered 2017-12-09: 2 mg via INTRAVENOUS

## 2017-12-09 MED ORDER — LACTATED RINGERS IV SOLN
INTRAVENOUS | Status: DC | PRN
  Administered 2017-12-09 (×2): via INTRAVENOUS

## 2017-12-09 MED ORDER — VANCOMYCIN HCL 1000 MG IV SOLR
INTRAVENOUS | Status: DC | PRN
  Administered 2017-12-09: 1000 mg

## 2017-12-09 MED ORDER — THROMBIN (RECOMBINANT) 5000 UNITS EX SOLR
CUTANEOUS | Status: DC | PRN
  Administered 2017-12-09 (×2): 5000 [IU] via TOPICAL

## 2017-12-09 MED ORDER — PROPOFOL 10 MG/ML IV BOLUS
INTRAVENOUS | Status: AC
  Filled 2017-12-09: qty 40

## 2017-12-09 MED ORDER — ACETAMINOPHEN 325 MG PO TABS
650.0000 mg | ORAL_TABLET | ORAL | Status: DC | PRN
  Administered 2017-12-10 – 2017-12-11 (×3): 650 mg via ORAL
  Filled 2017-12-09 (×3): qty 2

## 2017-12-09 MED ORDER — SUGAMMADEX SODIUM 200 MG/2ML IV SOLN
INTRAVENOUS | Status: AC
  Filled 2017-12-09: qty 2

## 2017-12-09 MED ORDER — METHOCARBAMOL 500 MG PO TABS
500.0000 mg | ORAL_TABLET | Freq: Four times a day (QID) | ORAL | Status: DC | PRN
  Administered 2017-12-09 – 2017-12-12 (×6): 500 mg via ORAL
  Filled 2017-12-09 (×5): qty 1

## 2017-12-09 MED ORDER — FENTANYL CITRATE (PF) 100 MCG/2ML IJ SOLN
INTRAMUSCULAR | Status: DC | PRN
  Administered 2017-12-09 (×2): 25 ug via INTRAVENOUS
  Administered 2017-12-09 (×2): 100 ug via INTRAVENOUS

## 2017-12-09 MED ORDER — MIDAZOLAM HCL 2 MG/2ML IJ SOLN
INTRAMUSCULAR | Status: AC
  Filled 2017-12-09: qty 2

## 2017-12-09 MED ORDER — HYDROCODONE-ACETAMINOPHEN 7.5-325 MG PO TABS
1.0000 | ORAL_TABLET | ORAL | Status: DC | PRN
  Administered 2017-12-09 – 2017-12-12 (×10): 1 via ORAL
  Filled 2017-12-09 (×10): qty 1

## 2017-12-09 MED ORDER — SENNA 8.6 MG PO TABS
1.0000 | ORAL_TABLET | Freq: Two times a day (BID) | ORAL | Status: DC
  Administered 2017-12-09 – 2017-12-13 (×7): 8.6 mg via ORAL
  Filled 2017-12-09 (×9): qty 1

## 2017-12-09 MED ORDER — LIDOCAINE 2% (20 MG/ML) 5 ML SYRINGE
INTRAMUSCULAR | Status: AC
  Filled 2017-12-09: qty 5

## 2017-12-09 MED ORDER — ONDANSETRON HCL 4 MG PO TABS: 4.0000 mg | ORAL_TABLET | Freq: Four times a day (QID) | ORAL | Status: DC | PRN

## 2017-12-09 MED ORDER — PHENOL 1.4 % MT LIQD: 1.0000 | OROMUCOSAL | Status: DC | PRN

## 2017-12-09 MED ORDER — FENTANYL CITRATE (PF) 250 MCG/5ML IJ SOLN
INTRAMUSCULAR | Status: AC
  Filled 2017-12-09: qty 5

## 2017-12-09 MED ORDER — HYDROCODONE-ACETAMINOPHEN 7.5-325 MG PO TABS
1.0000 | ORAL_TABLET | Freq: Four times a day (QID) | ORAL | Status: DC
Start: 2017-12-09 — End: 2017-12-09
  Administered 2017-12-09 (×2): 1 via ORAL
  Filled 2017-12-09 (×2): qty 1

## 2017-12-09 SURGICAL SUPPLY — 63 items
ADH SKN CLS APL DERMABOND .7 (GAUZE/BANDAGES/DRESSINGS) ×1
APL SKNCLS STERI-STRIP NONHPOA (GAUZE/BANDAGES/DRESSINGS) ×1
BAG DECANTER FOR FLEXI CONT (MISCELLANEOUS) ×2 IMPLANT
BENZOIN TINCTURE PRP APPL 2/3 (GAUZE/BANDAGES/DRESSINGS) ×2 IMPLANT
BUR MATCHSTICK NEURO 3.0 LAGG (BURR) ×2 IMPLANT
CANISTER SUCT 3000ML PPV (MISCELLANEOUS) ×2 IMPLANT
CARTRIDGE OIL MAESTRO DRILL (MISCELLANEOUS) ×1 IMPLANT
CLSR STERI-STRIP ANTIMIC 1/2X4 (GAUZE/BANDAGES/DRESSINGS) ×2 IMPLANT
DERMABOND ADVANCED (GAUZE/BANDAGES/DRESSINGS) ×1
DERMABOND ADVANCED .7 DNX12 (GAUZE/BANDAGES/DRESSINGS) ×1 IMPLANT
DIFFUSER DRILL AIR PNEUMATIC (MISCELLANEOUS) ×2 IMPLANT
DRAPE C-ARM 42X72 X-RAY (DRAPES) ×4 IMPLANT
DRAPE CAMERA VIDEO/LASER (DRAPES) IMPLANT
DRAPE LAPAROTOMY 100X72X124 (DRAPES) ×2 IMPLANT
DRAPE POUCH INSTRU U-SHP 10X18 (DRAPES) ×2 IMPLANT
DRAPE SURG 17X23 STRL (DRAPES) ×2 IMPLANT
DRSG OPSITE POSTOP 4X6 (GAUZE/BANDAGES/DRESSINGS) ×4 IMPLANT
DURAPREP 26ML APPLICATOR (WOUND CARE) ×2 IMPLANT
ELECT REM PT RETURN 9FT ADLT (ELECTROSURGICAL) ×2
ELECTRODE REM PT RTRN 9FT ADLT (ELECTROSURGICAL) ×1 IMPLANT
GAUZE SPONGE 4X4 16PLY XRAY LF (GAUZE/BANDAGES/DRESSINGS) IMPLANT
GENERATOR NEUROSTIMULATOR (Neurostimulator) ×2 IMPLANT
GLOVE BIO SURGEON STRL SZ7 (GLOVE) ×2 IMPLANT
GLOVE BIO SURGEON STRL SZ8 (GLOVE) ×2 IMPLANT
GLOVE BIOGEL PI IND STRL 6.5 (GLOVE) ×1 IMPLANT
GLOVE BIOGEL PI IND STRL 7.0 (GLOVE) ×1 IMPLANT
GLOVE BIOGEL PI IND STRL 7.5 (GLOVE) ×2 IMPLANT
GLOVE BIOGEL PI INDICATOR 6.5 (GLOVE) ×1
GLOVE BIOGEL PI INDICATOR 7.0 (GLOVE) ×1
GLOVE BIOGEL PI INDICATOR 7.5 (GLOVE) ×2
GLOVE SURG SS PI 7.5 STRL IVOR (GLOVE) ×6 IMPLANT
GOWN STRL REUS W/ TWL LRG LVL3 (GOWN DISPOSABLE) ×1 IMPLANT
GOWN STRL REUS W/ TWL XL LVL3 (GOWN DISPOSABLE) ×2 IMPLANT
GOWN STRL REUS W/TWL 2XL LVL3 (GOWN DISPOSABLE) IMPLANT
GOWN STRL REUS W/TWL LRG LVL3 (GOWN DISPOSABLE) ×2
GOWN STRL REUS W/TWL XL LVL3 (GOWN DISPOSABLE) ×4
HEMOSTAT POWDER KIT SURGIFOAM (HEMOSTASIS) IMPLANT
KIT BASIN OR (CUSTOM PROCEDURE TRAY) ×2 IMPLANT
KIT CHARGING (KITS) ×1
KIT CHARGING PRECISION NEURO (KITS) ×1 IMPLANT
KIT REMOTE CONTROL 112802 FREE (KITS) ×2 IMPLANT
KIT ROOM TURNOVER OR (KITS) ×2 IMPLANT
LEAD COVER EDGE 50CM STIM KIT (Lead) ×2 IMPLANT
NEEDLE HYPO 25X1 1.5 SAFETY (NEEDLE) ×2 IMPLANT
NEEDLE SPNL 20GX3.5 QUINCKE YW (NEEDLE) ×2 IMPLANT
NS IRRIG 1000ML POUR BTL (IV SOLUTION) ×2 IMPLANT
OIL CARTRIDGE MAESTRO DRILL (MISCELLANEOUS) ×2
PACK LAMINECTOMY NEURO (CUSTOM PROCEDURE TRAY) ×2 IMPLANT
PAD ARMBOARD 7.5X6 YLW CONV (MISCELLANEOUS) ×6 IMPLANT
RUBBERBAND STERILE (MISCELLANEOUS) ×4 IMPLANT
SEALANT ADHERUS EXTEND TIP (MISCELLANEOUS) IMPLANT
SPONGE SURGIFOAM ABS GEL SZ50 (HEMOSTASIS) ×2 IMPLANT
STRIP CLOSURE SKIN 1/2X4 (GAUZE/BANDAGES/DRESSINGS) ×2 IMPLANT
SUT SILK 3 0 SH 30 (SUTURE) ×2 IMPLANT
SUT VIC AB 0 CT1 18XCR BRD8 (SUTURE) ×1 IMPLANT
SUT VIC AB 0 CT1 8-18 (SUTURE) ×2
SUT VIC AB 2-0 CP2 18 (SUTURE) ×4 IMPLANT
SUT VIC AB 3-0 SH 8-18 (SUTURE) ×6 IMPLANT
TOOL LONG TUNNEL (SPINAL CORD STIMULATOR) ×2 IMPLANT
TOOL TUNNELER LONG 35 SPINAL (MISCELLANEOUS) ×2 IMPLANT
TOWEL GREEN STERILE (TOWEL DISPOSABLE) ×2 IMPLANT
TOWEL GREEN STERILE FF (TOWEL DISPOSABLE) ×2 IMPLANT
WATER STERILE IRR 1000ML POUR (IV SOLUTION) ×2 IMPLANT

## 2017-12-09 NOTE — Op Note (Signed)
12/09/2017  9:29 AM  PATIENT:  Lauren Winters  51 y.o. female  PRE-OPERATIVE DIAGNOSIS:  Failed back syndrome  POST-OPERATIVE DIAGNOSIS:  same  PROCEDURE:  Placement of permanent spinal cord stimulator paddle lead via T9 laminectomy with placement of pulse generator  SURGEON:  Sherley Bounds, MD  ASSISTANTSShelba Flake FNP  ANESTHESIA:   General  EBL: 50 ml  Total I/O In: 1000 [I.V.:1000] Out: 50 [Blood:50]  BLOOD ADMINISTERED: none  DRAINS: None  SPECIMEN:  none  INDICATION FOR PROCEDURE: This patient presented with severe back and leg pain after 2 previous lumbar spine surgeries. Imaging showed L3-4 disc disease. The patient tried conservative measures without relief. Pain was debilitating. She did well with a spinal cord stimulator trial and she presented for permanent spinal cord stimulator placement . Recommended permanent spinal course Taylor replacement. Patient understood the risks, benefits, and alternatives and potential outcomes and wished to proceed.  PROCEDURE DETAILS: The patient was taken to the operating room and after induction of adequate generalized endotracheal anesthesia, the patient was rolled into the prone position on the Wilson frame and all pressure points were padded. The thoracic and lumbar region was cleaned and then prepped with DuraPrep and draped in the usual sterile fashion. 5 cc of local anesthesia was injected and then a dorsal midline incision was made and carried down to the thoracic fascia. The fascia was opened and the paraspinous musculature was taken down in a subperiosteal fashion to expose the T9-10. A separate incision was made in the left flank and a pocket was created for the pulse generator and a bacitracin soaked sponge was placed into the pocket. We then used the high-speed drill and the Kerrison punches to perform a laminectomy and medial facetectomy at T9-10. We then passed the paddle lead until the tip was at the T7-8 disc space. Twice it  deviated to the right. Therefore I performed a similar laminectomy and medial facetectomy at T7-8. I was then able to pass the paddle lead and grab the distal end and move it into the center and under the ligament at T7-8. It looked perfect on the AP fluoroscopic shot. We then sewed the leads to the fascia. We then used a shunt passer to pass the leads from the thoracic incision to the flank incision. We hooked the leads to the pulse generator and checked impedances. They were good and therefore we locked the leads into the pulse generator, remove the bacitracin-soaked sponge and placed the pulse generator into the pocket and we checked impedances. We then irrigated with saline solution containing bacitracin. Took a final AP thoracic shot to confirm lead placement.  I irrigated with saline solution containing bacitracin. Achieved hemostasis with bipolar cautery, lined the dura with Gelfoam, laced powdered vancomycin into both wounds, and then closed the fascia with 0 Vicryl. I closed the subcutaneous tissues with 2-0 Vicryl and the subcuticular tissues with 3-0 Vicryl. The skin was then closed with benzoin and Steri-Strips. The drapes were removed, a sterile dressing was applied over both wounds. The patient was awakened from general anesthesia and transferred to the recovery room in stable condition. At the end of the procedure all sponge, needle and instrument counts were correct.    PLAN OF CARE: Admit for overnight observation  PATIENT DISPOSITION:  PACU - hemodynamically stable.   Delay start of Pharmacological VTE agent (>24hrs) due to surgical blood loss or risk of bleeding:  yes

## 2017-12-09 NOTE — Anesthesia Postprocedure Evaluation (Signed)
Anesthesia Post Note  Patient: Lauren Winters  Procedure(s) Performed: LUMBAR SPINAL CORD STIMULATOR IMPLANT (Back)     Patient location during evaluation: PACU Anesthesia Type: General Level of consciousness: awake and alert Pain management: pain level controlled Vital Signs Assessment: post-procedure vital signs reviewed and stable Respiratory status: spontaneous breathing, nonlabored ventilation, respiratory function stable and patient connected to nasal cannula oxygen Cardiovascular status: blood pressure returned to baseline and stable Postop Assessment: no apparent nausea or vomiting Anesthetic complications: no    Last Vitals:  Vitals:   12/09/17 1100 12/09/17 1120  BP: 101/83 139/61  Pulse: 70 64  Resp: 12 16  Temp: 36.6 C 36.5 C  SpO2: 93% 96%    Last Pain:  Vitals:   12/09/17 1349  TempSrc:   PainSc: 8                  Kahlyn Shippey EDWARD

## 2017-12-09 NOTE — H&P (Signed)
Subjective: Patient is a 51 y.o. female admitted for SCS. Onset of symptoms was several years ago, gradually worsening since that time.  The pain is rated severe, unremitting, and is located at the across the lower back and radiates to legs. The pain is described as aching and occurs all day. The symptoms have been progressive. Symptoms are exacerbated by exercise. MRI or CT showed spondylosis   Past Medical History:  Diagnosis Date  . Anxiety   . Atypical chest pain    a. ETT 05/2014: normal stress test without evidence of myocardial ischemia or chest pain at peak stress  . Complication of anesthesia    migraine headache  . Depression   . Diabetes mellitus without complication ([place] [facility]) metformin   dx 2015  . Dyspnea    on exertion  . Fibromyalgia   . Headache   . Heart murmur    told back in 2007, but no mention with 2016 cardiac w/u  . Hypertension   . Lupus (systemic lupus erythematosus) ([place])   . Mitral regurgitation    a. 05/2014 echo: EF >55%, nl LV/RV sys fxn, mild MR/AR, no valvular stenosis   . Obesity (BMI 30-39.9)   . Raynaud's phenomenon   . Rheumatoid arthritis ([place])   . Sjogren's disease ([place])   . Tobacco abuse    a. ongoing; b. ongoing SOB    Past Surgical History:  Procedure Laterality Date  . ABDOMINAL HYSTERECTOMY    . CESAREAN SECTION     x3  . LUMBAR LAMINECTOMY    . LUMBAR LAMINECTOMY/DECOMPRESSION MICRODISCECTOMY N/A 03/29/2017   Procedure: Re operative Right Lumbar Three-Four Laminectomy and discectomy with Left Lumbar Three-Four, Bilateral Lumbar Four-Five Laminectomy for decompression;  Surgeon: Kevan Ny Ditty, MD;  Location: [facility];  Service: Neurosurgery;  Laterality: N/A;  . TUBAL LIGATION      Prior to Admission medications   Medication Sig Start Date End Date Taking? Authorizing Provider  amLODipine (NORVASC) 5 MG tablet Take 1 tablet (5 mg total) by mouth 2 (two) times daily. 12/25/15  Yes Pleas Koch, NP  buPROPion (WELLBUTRIN XL) 150 MG  24 hr tablet Take 1 tablet (150 mg total) by mouth daily. 09/28/16  Yes Pleas Koch, NP  clobetasol cream (TEMOVATE) 4.62 % Apply 1 application topically 2 (two) times daily as needed (for lupis rash).   Yes [provider]  cloNIDine (CATAPRES) 0.1 MG tablet Take 1 tablet (0.1 mg total) by mouth 2 (two) times daily. 05/05/17  Yes Pleas Koch, NP  diclofenac (VOLTAREN) 75 MG EC tablet Take 75 mg by mouth 3 (three) times daily.  06/23/17  Yes [provider]  escitalopram (LEXAPRO) 20 MG tablet Take 20 mg by mouth daily.   Yes [provider]  folic acid (FOLVITE) 1 MG tablet Take 2 tablets (2 mg total) by mouth daily. 04/20/17 07/14/18 Yes Panwala, Naitik, PA-C  gabapentin (NEURONTIN) 300 MG capsule Take 600 mg by mouth 3 (three) times daily.  11/02/16  Yes [provider]  glipiZIDE (GLUCOTROL) 10 MG tablet Take 1 tablet (10 mg total) 2 (two) times daily before a meal by mouth. 11/09/17  Yes Pleas Koch, NP  Halcinonide 0.1 % OINT Apply 1 application topically 2 (two) times daily as needed (for lupis rash).    Yes [provider]  hydroxychloroquine (PLAQUENIL) 200 MG tablet Take 1 tablet (200 mg total) by mouth 2 (two) times daily. 08/11/17  Yes Bo Merino, MD  hydrOXYzine (ATARAX/VISTARIL) 25 MG  tablet Take 1 tablet (25 mg total) by mouth 2 (two) times daily as needed for anxiety. Patient taking differently: Take 25 mg by mouth 2 (two) times daily.  07/28/16  Yes Pleas Koch, NP  metFORMIN (GLUCOPHAGE) 1000 MG tablet Take 1 tablet (1,000 mg total) by mouth 2 (two) times daily with a meal. 03/17/16  Yes Pleas Koch, NP  methocarbamol (ROBAXIN) 500 MG tablet TAKE 1 TABLET (500 MG TOTAL) BY MOUTH EVERY 8 (EIGHT) HOURS AS NEEDED FOR MUSCLE SPASMS. 02/18/17  Yes [provider]  methotrexate 50 MG/2ML injection Inject 20 mg into the muscle every Tuesday.   Yes [provider]  metoprolol (LOPRESSOR) 100 MG  tablet Take 1 tablet (100 mg total) by mouth 2 (two) times daily. 12/17/15  Yes Pleas Koch, NP  pilocarpine (SALAGEN) 5 MG tablet Take 5 mg by mouth daily as needed (for dry mouth).    Yes [provider]  tacrolimus (PROTOPIC) 0.1 % ointment Apply 1 application topically 2 (two) times daily as needed (for lupis rash).    Yes [provider]  Triamcinolone Acetonide (TRIAMCINOLONE 0.1 % CREAM : EUCERIN) CREA Apply 1 application topically 2 (two) times daily as needed for rash or irritation.    Yes [provider]  valsartan-hydrochlorothiazide (DIOVAN-HCT) 320-25 MG tablet TAKE 1 TABLET BY MOUTH DAILY. 10/06/17  Yes Pleas Koch, NP  cyclobenzaprine (FLEXERIL) 10 MG tablet Take 1 tablet (10 mg total) by mouth 3 (three) times daily as needed for muscle spasms. Patient not taking: Reported on 12/02/2017 03/30/17   Traci Sermon, PA-C  magic mouthwash w/lidocaine SOLN diphenhydramine/ 2%  VISCOUS lidocaine /antacid-MAALOX (1:1:1); 15 mL swished in the mouth for 30 sec, then spit out.    May be used every 3 to 4 h Patient not taking: Reported on 12/02/2017 04/20/17   Eliezer Lofts, PA-C  Vitamin D, Ergocalciferol, (DRISDOL) 50000 units CAPS capsule Take 1 capsule (50,000 Units total) by mouth 2 (two) times a week. Patient not taking: Reported on 12/02/2017 07/21/17   Eliezer Lofts, PA-C   Allergies  Allergen Reactions  . Pseudoephedrine Hcl Shortness Of Breath and Other (See Comments)    Room starts spinning, can't breath  . Clindamycin/Lincomycin Hives  . Prednisone Other (See Comments)    Chest pain w/ cortisone shots or prednisone pills Elevated blood pressure  . Adhesive [Tape] Rash and Other (See Comments)    Sensitivity to certain band aids and adhesives - burns skin, makes skin raw  . Other Rash and Other (See Comments)    Sensitivity to certain band aids and adhesives - burns skin, makes skin raw  . Sudafed [Pseudoephedrine Hcl] Other (See  Comments)    Dizziness     Social History   Tobacco Use  . Smoking status: Former Smoker    Packs/day: 0.50    Years: 10.00    Pack years: 5.00    Types: Cigarettes    Last attempt to quit: 07/22/2015    Years since quitting: 2.3  . Smokeless tobacco: Never Used  Substance Use Topics  . Alcohol use: No    Alcohol/week: 0.0 oz    Family History  Problem Relation Age of Onset  . Hypertension Mother   . Valvular heart disease Mother   . CAD Father 72       CABG x 4  . Liver cancer Father 90       passed  . CAD Paternal 35  CABG  . Breast cancer Paternal Aunt   . CAD Paternal Aunt        CABG  . Breast cancer Paternal Aunt   . CAD Paternal Aunt        CABG  . Breast cancer Paternal Aunt   . CAD Paternal Aunt        CABG  . Breast cancer Paternal Aunt   . CAD Paternal Aunt        CABG  . Breast cancer Paternal Aunt   . CAD Paternal Uncle        MI s/p CABG  . Stroke Maternal Grandmother   . Stroke Maternal Grandfather   . CAD Maternal Grandfather      Review of Systems  Positive ROS: neg  All other systems have been reviewed and were otherwise negative with the exception of those mentioned in the HPI and as above.  Objective: Vital signs in last 24 hours: Temp:  [98.3 F (36.8 C)] 98.3 F (36.8 C) (12/14 0554) Pulse Rate:  [91] 91 (12/14 0554) Resp:  [20] 20 (12/14 0554) BP: (113)/(57) 113/57 (12/14 0554) SpO2:  [98 %] 98 % (12/14 0554)  General Appearance: Alert, cooperative, no distress, appears stated age Head: Normocephalic, without obvious abnormality, atraumatic Eyes: PERRL, conjunctiva/corneas clear, EOM's intact    Neck: Supple, symmetrical, trachea midline Back: Symmetric, no curvature, ROM normal, no CVA tenderness Lungs:  respirations unlabored Heart: Regular rate and rhythm Abdomen: Soft, non-tender Extremities: Extremities normal, atraumatic, no cyanosis or edema Pulses: 2+ and symmetric all extremities Skin: Skin color,  texture, turgor normal, no rashes or lesions  NEUROLOGIC:   Mental status: Alert and oriented x4,  no aphasia, good attention span, fund of knowledge, and memory Motor Exam - grossly normal Sensory Exam - grossly normal Reflexes: 1+ Coordination - grossly normal Gait - grossly normal Balance - grossly normal Cranial Nerves: I: smell Not tested  II: visual acuity  OS: nl    OD: nl  II: visual fields Full to confrontation  II: pupils Equal, round, reactive to light  III,VII: ptosis None  III,IV,VI: extraocular muscles  Full ROM  V: mastication Normal  V: facial light touch sensation  Normal  V,VII: corneal reflex  Present  VII: facial muscle function - upper  Normal  VII: facial muscle function - lower Normal  VIII: hearing Not tested  IX: soft palate elevation  Normal  IX,X: gag reflex Present  XI: trapezius strength  5/5  XI: sternocleidomastoid strength 5/5  XI: neck flexion strength  5/5  XII: tongue strength  Normal    Data Review Lab Results  Component Value Date   WBC 7.9 12/06/2017   HGB 13.4 12/06/2017   HCT 39.0 12/06/2017   MCV 82.6 12/06/2017   PLT 291 12/06/2017   Lab Results  Component Value Date   NA 133 (L) 12/06/2017   K 4.0 12/06/2017   CL 98 (L) 12/06/2017   CO2 25 12/06/2017   BUN 12 12/06/2017   CREATININE 0.85 12/06/2017   GLUCOSE 360 (H) 12/06/2017   Lab Results  Component Value Date   INR 0.99 12/06/2017    Assessment/Plan: Patient admitted for SCS. Patient has failed a reasonable attempt at conservative therapy.  I explained the condition and procedure to the patient and answered any questions.  Patient wishes to proceed with procedure as planned. Understands risks/ benefits and typical outcomes of procedure.   Favio Moder S 12/09/2017 7:11 AM

## 2017-12-09 NOTE — Anesthesia Procedure Notes (Signed)
Procedure Name: Intubation Date/Time: 12/09/2017 7:35 AM Performed by: Colin Benton, CRNA Pre-anesthesia Checklist: Patient identified, Emergency Drugs available, Suction available and Patient being monitored Patient Re-evaluated:Patient Re-evaluated prior to induction Oxygen Delivery Method: Circle system utilized Preoxygenation: Pre-oxygenation with 100% oxygen Induction Type: IV induction Ventilation: Mask ventilation without difficulty Laryngoscope Size: Miller and 2 Grade View: Grade I Tube type: Oral Tube size: 7.0 mm Number of attempts: 1 Airway Equipment and Method: Stylet Placement Confirmation: ETT inserted through vocal cords under direct vision,  positive ETCO2 and breath sounds checked- equal and bilateral Secured at: 23 cm Tube secured with: Tape Dental Injury: Teeth and Oropharynx as per pre-operative assessment

## 2017-12-09 NOTE — Transfer of Care (Signed)
Immediate Anesthesia Transfer of Care Note  Patient: Lauren Winters  Procedure(s) Performed: LUMBAR SPINAL CORD STIMULATOR IMPLANT (Back)  Patient Location: PACU  Anesthesia Type:General  Level of Consciousness: drowsy and patient cooperative  Airway & Oxygen Therapy: Patient Spontanous Breathing and Patient connected to nasal cannula oxygen  Post-op Assessment: Report given to RN, Post -op Vital signs reviewed and stable and Patient moving all extremities X 4  Post vital signs: Reviewed and stable  Last Vitals:  Vitals:   12/09/17 0554  BP: (!) 113/57  Pulse: 91  Resp: 20  Temp: 36.8 C  SpO2: 98%    Last Pain:  Vitals:   12/09/17 0656  TempSrc:   PainSc: 7       Patients Stated Pain Goal: 4 (03/54/65 6812)  Complications: No apparent anesthesia complications

## 2017-12-10 DIAGNOSIS — M961 Postlaminectomy syndrome, not elsewhere classified: Secondary | ICD-10-CM | POA: Diagnosis not present

## 2017-12-10 LAB — GLUCOSE, CAPILLARY
GLUCOSE-CAPILLARY: 189 mg/dL — AB (ref 65–99)
GLUCOSE-CAPILLARY: 125 mg/dL — AB (ref 65–99)
Glucose-Capillary: 184 mg/dL — ABNORMAL HIGH (ref 65–99)
Glucose-Capillary: 98 mg/dL (ref 65–99)

## 2017-12-10 NOTE — Progress Notes (Signed)
Patient transferred from room 3C07 to unit 5N14 at this time. Alert and in stable condition. Report given to receiving nurse Caryl Pina, RN with all questions answered. Transferred via bed with all belongings at side.

## 2017-12-10 NOTE — Evaluation (Addendum)
Physical Therapy Evaluation Patient Details Name: Lauren Winters MRN: [id number] DOB: 1966/04/03 Today's Date: 12/10/2017   History of Present Illness  51 year old female scheduled for lumbar spinal cord stimulator implant on 12/09/2017. PMH includes:HTN, heart murmur, DM,lupus, RA, Raynaud's, Sjogren's. Former smoker. BMI 36. S/p lumbar laminectomy 03/29/17.  Clinical Impression  Orders received for PT evaluation. Patient demonstrates deficits in functional mobility as indicated below. Will benefit from continued skilled PT to address deficits and maximize function. Will see as indicated and progress as tolerated.   OF NOTE: PATIENT WITH NEW ONSET RLE WEAKNESS AND FOOT DROP (previously unaffected limb) in addition to previous pain and sensory issues in LLE, nsg aware    Follow Up Recommendations Outpatient PT;Supervision/Assistance - 24 hour(new onset RLE foot drop and gait impairments)    Equipment Recommendations  None recommended by PT    Recommendations for Other Services       Precautions / Restrictions Precautions Precautions: Back Restrictions Weight Bearing Restrictions: No      Mobility  Bed Mobility Overal bed mobility: Needs Assistance Bed Mobility: Rolling;Sidelying to Sit;Sit to Sidelying Rolling: Supervision Sidelying to sit: Supervision     Sit to sidelying: Min assist General bed mobility comments: min assist to elevate LEs back to sidelying  Transfers Overall transfer level: Needs assistance Equipment used: Rolling walker (2 wheeled) Transfers: Sit to/from Stand Sit to Stand: Min guard         General transfer comment: VCs for hand placement and positioning  Ambulation/Gait Ambulation/Gait assistance: Min guard Ambulation Distance (Feet): 40 Feet Assistive device: Rolling walker (2 wheeled) Gait Pattern/deviations: Step-to pattern;Decreased stance time - left;Decreased dorsiflexion - right;Shuffle Gait velocity: decreased   General Gait  Details: patient with noted weakness developing in right LE (NEW) with foot drop noted  Stairs            Wheelchair Mobility    Modified Rankin (Stroke Patients Only)       Balance Overall balance assessment: Needs assistance   Sitting balance-Leahy Scale: Good     Standing balance support: Bilateral upper extremity supported Standing balance-Leahy Scale: Poor Standing balance comment: reliance on UE support                             Pertinent Vitals/Pain Pain Assessment: 0-10 Pain Score: 5  Pain Location: back and left leg Pain Descriptors / Indicators: Aching;Sore Pain Intervention(s): Monitored during session    Home Living Family/patient expects to be discharged to:: Private residence Living Arrangements: Spouse/significant other Available Help at Discharge: Family;Available 24 hours/day Type of Home: House Home Access: Stairs to enter Entrance Stairs-Rails: None Entrance Stairs-Number of Steps: 2 Home Layout: One level Home Equipment: Walker - 2 wheels;Cane - single point Additional Comments: between husband and children, someone can be home to provide 24 hour assist upon d/c    Prior Function Level of Independence: Independent with assistive device(s)         Comments: RW and cane, primarily used cane     Hand Dominance   Dominant Hand: Right    Extremity/Trunk Assessment   Upper Extremity Assessment Upper Extremity Assessment: Generalized weakness    Lower Extremity Assessment Lower Extremity Assessment: RLE deficits/detail;LLE deficits/detail RLE Deficits / Details: NEW ONSET RLE weakness, footdrop developing RLE Sensation: decreased light touch RLE Coordination: decreased fine motor;decreased gross motor LLE Deficits / Details: LLE weakness with pain        Communication   Communication:  No difficulties  Cognition Arousal/Alertness: Awake/alert Behavior During Therapy: WFL for tasks assessed/performed Overall  Cognitive Status: Within Functional Limits for tasks assessed                                        General Comments      Exercises     Assessment/Plan    PT Assessment Patient needs continued PT services  PT Problem List Decreased strength;Decreased range of motion;Decreased activity tolerance;Decreased balance;Decreased mobility;Impaired sensation;Pain       PT Treatment Interventions DME instruction;Gait training;Stair training;Functional mobility training;Therapeutic activities;Therapeutic exercise;Balance training;Patient/family education    PT Goals (Current goals can be found in the Care Plan section)  Acute Rehab PT Goals Patient Stated Goal: to not hurt PT Goal Formulation: With patient Time For Goal Achievement: 12/24/17 Potential to Achieve Goals: Good    Frequency Min 5X/week   Barriers to discharge        Co-evaluation               AM-PAC PT "6 Clicks" Daily Activity  Outcome Measure Difficulty turning over in bed (including adjusting bedclothes, sheets and blankets)?: None Difficulty moving from lying on back to sitting on the side of the bed? : None Difficulty sitting down on and standing up from a chair with arms (e.g., wheelchair, bedside commode, etc,.)?: A Little Help needed moving to and from a bed to chair (including a wheelchair)?: A Little Help needed walking in hospital room?: A Little Help needed climbing 3-5 steps with a railing? : A Lot 6 Click Score: 19    End of Session Equipment Utilized During Treatment: Gait belt Activity Tolerance: Patient limited by pain;Other (comment)(new onset RLE weakness) Patient left: in bed;with call bell/phone within reach Nurse Communication: Mobility status(New onset RLE weakness) PT Visit Diagnosis: Difficulty in walking, not elsewhere classified (R26.2)    Time: 1005-1026 PT Time Calculation (min) (ACUTE ONLY): 21 min   Charges:   PT Evaluation $PT Eval Moderate Complexity:  1 Mod     PT G Codes:        Alben Deeds, PT DPT  Board Certified Neurologic Specialist [place] 2018-01-03, 10:51 AM   2018-01-03 1000  PT G-Codes **NOT FOR INPATIENT CLASS**  Functional Assessment Tool Used Clinical judgement  Functional Limitation Mobility: Walking and moving around  Mobility: Walking and Moving Around Current Status [phone]) CJ  Mobility: Walking and Moving Around Goal Status [phone]) CI

## 2017-12-10 NOTE — Progress Notes (Signed)
Patient ID: Lauren Winters, female   DOB: 08/31/1966, 51 y.o.   MRN: [id number] Subjective: The patient is alert and pleasant.  She does not feel she is ready to go home.  Objective: Vital signs in last 24 hours: Temp:  [97.5 F (36.4 C)-98.5 F (36.9 C)] 97.8 F (36.6 C) (12/15 0310) Pulse Rate:  [59-88] 59 (12/15 0310) Resp:  [11-18] 18 (12/15 0310) BP: (101-139)/(40-88) 131/64 (12/15 0310) SpO2:  [93 %-100 %] 100 % (12/15 0310) Weight:  [101.6 kg (224 lb)] 101.6 kg (224 lb) (12/14 1146)  Intake/Output from previous day: 12/14 0701 - 12/15 0700 In: 1980 [P.O.:480; I.V.:1200; IV Piggyback:200] Out: 50 [Blood:50] Intake/Output this shift: No intake/output data recorded.  Physical exam the patient is alert and pleasant.  She is moving her lower extremities well.  Lab Results: No results for input(s): WBC, HGB, HCT, PLT in the last 72 hours. BMET No results for input(s): NA, K, CL, CO2, GLUCOSE, BUN, CREATININE, CALCIUM in the last 72 hours.  Studies/Results: Dg Thoracic Spine 1 View  Result Date: 12/09/2017 CLINICAL DATA:  Stimulator placement EXAM: OPERATIVE THORACIC SPINE 1 VIEW COMPARISON:  None. FLUOROSCOPY TIME:  0 minutes 22 seconds; one acquired image FINDINGS: Stimulator tips are positioned in the lower thoracic spine. Visualized bony structures appear normal. IMPRESSION: Dilated lead tips in lower thoracic region without complicating feature appreciable on frontal view. Electronically Signed   By: Lowella Grip III M.D.   On: 12/09/2017 09:11   Dg C-arm 1-60 Min  Result Date: 12/09/2017 CLINICAL DATA:  Spinal cord stimulator. EXAM: DG C-ARM 61-120 MIN COMPARISON:  MRI 10/26/2017. FINDINGS: Spinal cord stimulator noted with lead tips over the lower thoracic spine. NG tube noted. No acute bony abnormality . IMPRESSION: Spinal cord stimulator noted with lead tips over the lower thoracic spine. Electronically Signed   By: [place]   On: 12/09/2017 09:12     Assessment/Plan: Postop day 1: We will mobilize the patient.  Perhaps she will go home tomorrow.  LOS: 0 days     Ophelia Charter 12/10/2017, 6:36 AM

## 2017-12-11 ENCOUNTER — Encounter (HOSPITAL_COMMUNITY): Payer: Self-pay | Admitting: *Deleted

## 2017-12-11 DIAGNOSIS — M961 Postlaminectomy syndrome, not elsewhere classified: Secondary | ICD-10-CM | POA: Diagnosis not present

## 2017-12-11 LAB — GLUCOSE, CAPILLARY
GLUCOSE-CAPILLARY: 120 mg/dL — AB (ref 65–99)
Glucose-Capillary: 152 mg/dL — ABNORMAL HIGH (ref 65–99)
Glucose-Capillary: 116 mg/dL — ABNORMAL HIGH (ref 65–99)
Glucose-Capillary: 78 mg/dL (ref 65–99)

## 2017-12-11 NOTE — Progress Notes (Signed)
BP 76/33 pulse 63.. Dr Ronnald Ramp in the room states to continue to ,monitor. Susie Cassette RN

## 2017-12-11 NOTE — Progress Notes (Signed)
Patient ID: Lauren Winters, female   DOB: 25-Dec-1966, 51 y.o.   MRN: [id number] After reading her progress notes and physical therapy note I  came to see her and examine her. She does complain of a headache starting in the neck which goes to the top of the head when she is upright. No nausea. I did not see spinal fluid at the time of her surgery. She tells me she had a spinal fluid leak after both lumbar laminectomies done by the other surgeons. Her dressing remains flat and dry. I do not have a high suspicion for CSF leak at this time.  She walked with therapy and did stairs today. She denies new numbness or tingling in the legs. On physical exam she gives me a little effort with strength exam while sitting in the chair though she is antigravity in all muscle groups tested, and it appears equal on both sides. I do not see a right foot drop as documented. She has ratchet like exam to strength testing, and I think if she were truly 3/5 or 4 minus out of 5 then she would not be able to walk and do stairs with physical therapy yesterday and today. Functionally, this takes more strength than the effort she was giving me.  Also, when I asked her to dorsiflex her feet she ratcheted them upwards slowly, and they are very equal bilaterally, and yet I observed her during our conversation fully and fluidly dorsiflexing her right foot and scratching the top of her left foot with it. Therefore I think there are some inconsistencies. She states, "I think I'm doing good." And she states, "I'm going home tomorrow lol." Based on what I have seen today and after talking to her I do not believe there are significant new neurologic deficits and therefore I do not think workup with imaging is required. In fact, she seems quite pleased with her progress. Certainly, I will continue to monitor her progression but at this point after seeing her myself I feel better about her.   Her blood pressure is low right now but she is  asymptomatic and not tachycardic, and she was given all 5 blood pressure medications this morning. I will hold the blood pressure medications for systolic blood pressures less than 160. It is not uncommon to see asymptomatic low pressures after spinal surgery

## 2017-12-11 NOTE — Progress Notes (Signed)
Patient ID: Lauren Winters, female   DOB: July 19, 1966, 51 y.o.   MRN: [id number] Subjective: The patient complains of a postural headache.  She is concerned she may have a spinal fluid leak.  She does not feel she is ready to go home yet. Objective: Vital signs in last 24 hours: Temp:  [96.8 F (36 C)-98.2 F (36.8 C)] 97.7 F (36.5 C) (12/16 0547) Pulse Rate:  [55-65] 55 (12/16 0547) Resp:  [18] 18 (12/15 1624) BP: (77-109)/(30-63) 104/53 (12/16 0632) SpO2:  [94 %-100 %] 96 % (12/16 0547)  Intake/Output from previous day: 12/15 0701 - 12/16 0700 In: 240 [P.O.:240] Out: -  Intake/Output this shift: No intake/output data recorded.  Physical exam the patient is alert and pleasant.  Her dressing is clean and dry.  There is no drainage.  Her strength is grossly normal in her lower extremities.  Lab Results: No results for input(s): WBC, HGB, HCT, PLT in the last 72 hours. BMET No results for input(s): NA, K, CL, CO2, GLUCOSE, BUN, CREATININE, CALCIUM in the last 72 hours.  Studies/Results: No results found.  Assessment/Plan: Postop day #2: I encouraged the patient to mobilize.  Perhaps she will go home tomorrow.  LOS: 0 days     Ophelia Charter 12/11/2017, 10:16 AM

## 2017-12-11 NOTE — Progress Notes (Signed)
Physical Therapy Progress Note  Patient is making progress toward mobility goals and able to negotiate steps with [facility] and HHA this session. Pt continues to demonstrate R foot drop while ambulating and c/o headache when in upright position. BP soft with pt asymptomatic and RN notified. Continue to progress as tolerated.    12/11/17 1110  PT Visit Information  Last PT Received On 12/11/17  Assistance Needed +1  History of Present Illness 51 year old female scheduled for lumbar spinal cord stimulator implant on 12/09/2017. PMH includes:HTN, heart murmur, DM,lupus, RA, Raynaud's, Sjogren's. Former smoker. BMI 36. S/p lumbar laminectomy 03/29/17.  Precautions  Precautions Back  Pain Assessment  Pain Assessment Faces  Faces Pain Scale 4  Pain Location pelvis and L LE  Pain Descriptors / Indicators Sore;Guarding;Grimacing;Sharp  Pain Intervention(s) Limited activity within patient's tolerance;Monitored during session;Premedicated before session;Repositioned  Cognition  Arousal/Alertness Awake/alert  Behavior During Therapy WFL for tasks assessed/performed  Overall Cognitive Status Within Functional Limits for tasks assessed  Bed Mobility  Overal bed mobility Needs Assistance  Bed Mobility Rolling;Sidelying to Sit  Rolling Supervision  Sidelying to sit Supervision  General bed mobility comments supervision for safety  Transfers  Overall transfer level Needs assistance  Equipment used Rolling walker (2 wheeled)  Transfers Sit to/from Stand  Sit to Stand Min guard  General transfer comment cues for safe hand placement  Ambulation/Gait  Ambulation/Gait assistance Min guard  Ambulation Distance (Feet) 90 Feet  Assistive device Rolling walker (2 wheeled)  Gait Pattern/deviations Decreased stance time - left;Decreased dorsiflexion - right;Step-through pattern;Decreased step length - right;Decreased step length - left;Shuffle  General Gait Details pt continues to demonstrate R foot drop and  with shuffling steps with L foot; pt with increasing difficulty clearing floor with R foot when fatigued  Gait velocity decreased  Stairs Yes  Stairs assistance Min assist  Stair Management No rails;Step to pattern;With cane (and HHA )  Number of Stairs 2  General stair comments cues for sequencing and use of AD; SPC L hand and HHA R side; L hip flexion is difficult for pt due to pain   Balance  Overall balance assessment Needs assistance  Sitting balance-Leahy Scale Good  Standing balance support Bilateral upper extremity supported  Standing balance-Leahy Scale Poor  Standing balance comment reliance on UE support  General Comments  General comments (skin integrity, edema, etc.) pt with headache when upright; BP soft when up in chair end of session and RN notified   PT - End of Session  Equipment Utilized During Treatment Gait belt  Activity Tolerance Patient limited by fatigue;Patient limited by pain  Patient left with call bell/phone within reach;in chair  Nurse Communication Mobility status;Other (comment) (low BP and headache)  PT - Assessment/Plan  PT Plan Current plan remains appropriate  PT Visit Diagnosis Difficulty in walking, not elsewhere classified (R26.2)  PT Frequency (ACUTE ONLY) Min 5X/week  Follow Up Recommendations Outpatient PT;Supervision/Assistance - 24 hour (new onset RLE foot drop and gait impairments)  PT equipment None recommended by PT  AM-PAC PT "6 Clicks" Daily Activity Outcome Measure  Difficulty turning over in bed (including adjusting bedclothes, sheets and blankets)? 4  Difficulty moving from lying on back to sitting on the side of the bed?  4  Difficulty sitting down on and standing up from a chair with arms (e.g., wheelchair, bedside commode, etc,.)? 1  Help needed moving to and from a bed to chair (including a wheelchair)? 3  Help needed walking in hospital room?  3  Help needed climbing 3-5 steps with a railing?  2  6 Click Score 17  Mobility G  Code  CK  PT Goal Progression  Progress towards PT goals Progressing toward goals  Acute Rehab PT Goals  PT Goal Formulation With patient  Time For Goal Achievement 12/24/17  Potential to Achieve Goals Good  PT Time Calculation  PT Start Time (ACUTE ONLY) 1021  PT Stop Time (ACUTE ONLY) 1100  PT Time Calculation (min) (ACUTE ONLY) 39 min  PT General Charges  $$ ACUTE PT VISIT 1 Visit  PT Treatments  $Gait Training 23-37 mins  $Therapeutic Activity 8-22 mins   Earney Navy, PTA Pager: [phone]

## 2017-12-11 NOTE — Progress Notes (Signed)
Patient complains of positional headaches and a sense of imbalance. When patient gets up headache occurs and while lying down headaches are relieved. Last BP 104/53 MAP (64) while lying and awake. Will give patient Tylenol to patient for comfort. Andris Baumann, MD paged. MD states he will see patient this morning and address concerns. Will continue to monitor patient.

## 2017-12-12 DIAGNOSIS — M961 Postlaminectomy syndrome, not elsewhere classified: Secondary | ICD-10-CM | POA: Diagnosis not present

## 2017-12-12 LAB — GLUCOSE, CAPILLARY
GLUCOSE-CAPILLARY: 80 mg/dL (ref 65–99)
GLUCOSE-CAPILLARY: 98 mg/dL (ref 65–99)
GLUCOSE-CAPILLARY: 138 mg/dL — AB (ref 65–99)
Glucose-Capillary: 151 mg/dL — ABNORMAL HIGH (ref 65–99)

## 2017-12-12 NOTE — Progress Notes (Signed)
Patient leaving unit.  On her way to [facility] [address].

## 2017-12-12 NOTE — Progress Notes (Signed)
Report given to [place] on [place].  Patient will be transferring to [address].

## 2017-12-12 NOTE — Progress Notes (Signed)
Patient ID: Lauren Winters, female   DOB: 1966/06/19, 51 y.o.   MRN: [id number] Looks good today and feels like she is getting better. Still complains of positional headache. No nausea or vomiting. Her dressing remained dry and flat. I still find inconsistencies and her neurologic exam of her lower extremities. She denies new numbness and tingling. She still complains of some weakness of dorsiflexion, and on exam she gives me ratcheting muscle contractions to full antigravity strength, but during conversation and when I ask her to turn in bed to let me see her dressing she moves her legs fluidly and with good power and strength. We'll give her 1 more day in the hospital to get therapy and make sure she safe for discharge home.

## 2017-12-12 NOTE — Progress Notes (Signed)
Physical Therapy Treatment Patient Details Name: Lauren Winters MRN: [id number] DOB: 03-16-1966 Today's Date: 12/12/2017    History of Present Illness 51 year old female scheduled for lumbar spinal cord stimulator implant on 12/09/2017. PMH includes:HTN, heart murmur, DM,lupus, RA, Raynaud's, Sjogren's. Former smoker. BMI 36. S/p lumbar laminectomy 03/29/17.    PT Comments    Patient is making progress with mobility and tolerated increased activity. Stairs practiced again today and pt required min A and reported that is baseline.  While ambulating pt continues to demonstrate R foot drop however able to demonstrate active R ankle DF while seated in chair. Pt did not c/o headache as much this session. Current plan remains appropriate.   Follow Up Recommendations  Outpatient PT;Supervision/Assistance - 24 hour(new onset RLE foot drop and gait impairments)     Equipment Recommendations  None recommended by PT    Recommendations for Other Services       Precautions / Restrictions Precautions Precautions: Back Restrictions Weight Bearing Restrictions: No    Mobility  Bed Mobility Overal bed mobility: Modified Independent Bed Mobility: Rolling;Sidelying to Sit           General bed mobility comments: increased time and effort  Transfers Overall transfer level: Needs assistance Equipment used: Rolling walker (2 wheeled) Transfers: Sit to/from Stand Sit to Stand: Min guard         General transfer comment: min guard for safety  Ambulation/Gait Ambulation/Gait assistance: Supervision;Min guard Ambulation Distance (Feet): 150 Feet Assistive device: Rolling walker (2 wheeled) Gait Pattern/deviations: Decreased dorsiflexion - right;Step-through pattern;Decreased stride length;Shuffle Gait velocity: decreased   General Gait Details: R LE weakness demonstrated; cues for bilat step length/height    Stairs     Stair Management: No rails;Step to pattern;Backwards;With  walker Number of Stairs: 2 General stair comments: cues for sequencing and technique  Wheelchair Mobility    Modified Rankin (Stroke Patients Only)       Balance Overall balance assessment: Needs assistance   Sitting balance-Leahy Scale: Good     Standing balance support: Bilateral upper extremity supported Standing balance-Leahy Scale: Poor Standing balance comment: reliance on UE support                            Cognition Arousal/Alertness: Awake/alert Behavior During Therapy: WFL for tasks assessed/performed Overall Cognitive Status: Within Functional Limits for tasks assessed                                        Exercises      General Comments        Pertinent Vitals/Pain Pain Assessment: Faces Faces Pain Scale: Hurts even more Pain Location: L hip and LE Pain Descriptors / Indicators: Sore;Guarding;Aching Pain Intervention(s): Limited activity within patient's tolerance;Monitored during session;Repositioned    Home Living                      Prior Function            PT Goals (current goals can now be found in the care plan section) Acute Rehab PT Goals PT Goal Formulation: With patient Time For Goal Achievement: 12/24/17 Potential to Achieve Goals: Good Progress towards PT goals: Progressing toward goals    Frequency    Min 5X/week      PT Plan Current plan remains appropriate    Co-evaluation  AM-PAC PT "6 Clicks" Daily Activity  Outcome Measure  Difficulty turning over in bed (including adjusting bedclothes, sheets and blankets)?: None Difficulty moving from lying on back to sitting on the side of the bed? : None Difficulty sitting down on and standing up from a chair with arms (e.g., wheelchair, bedside commode, etc,.)?: Unable Help needed moving to and from a bed to chair (including a wheelchair)?: A Little Help needed walking in hospital room?: A Little Help needed  climbing 3-5 steps with a railing? : A Little 6 Click Score: 18    End of Session Equipment Utilized During Treatment: Gait belt Activity Tolerance: Patient tolerated treatment well Patient left: with call bell/phone within reach;in chair Nurse Communication: Mobility status PT Visit Diagnosis: Difficulty in walking, not elsewhere classified (R26.2)     Time: 1200-1230 PT Time Calculation (min) (ACUTE ONLY): 30 min  Charges:  $Gait Training: 8-22 mins $Therapeutic Activity: 8-22 mins                    G Codes:       Earney Navy, PTA Pager: [phone]     Darliss Cheney 12/12/2017, 1:53 PM

## 2017-12-13 DIAGNOSIS — M961 Postlaminectomy syndrome, not elsewhere classified: Secondary | ICD-10-CM | POA: Diagnosis not present

## 2017-12-13 LAB — GLUCOSE, CAPILLARY
GLUCOSE-CAPILLARY: 114 mg/dL — AB (ref 65–99)
GLUCOSE-CAPILLARY: 119 mg/dL — AB (ref 65–99)

## 2017-12-13 MED ORDER — HYDROCODONE-ACETAMINOPHEN 7.5-325 MG PO TABS: 1.0000 | ORAL_TABLET | ORAL | 0 refills | Status: DC | PRN

## 2017-12-13 MED ORDER — CYCLOBENZAPRINE HCL 10 MG PO TABS: 10.0000 mg | ORAL_TABLET | Freq: Three times a day (TID) | ORAL | 2 refills | Status: DC | PRN

## 2017-12-13 NOTE — Progress Notes (Signed)
Patient is ready to be discharge, awaiting for walker at this time.   Ave Filter, RN

## 2017-12-13 NOTE — Care Management Note (Signed)
Case Management Note  Patient Details  Name: Lauren Winters MRN: [id number] Date of Birth: [birth year]  Subjective/Objective:  Pt s/p spinal stimulator. She is from home with her spouse.                  Action/Plan: Pt discharging home with orders for [facility] services. CM provided the patient choice and she selected [place]. Dan with [facility] notified and accepted the referral.  Pt with orders for rolling walker. Dan with [facility] will deliver the equipment to the room.  Pt states her husband will provide transportation home.   Expected Discharge Date:  12/13/17               Expected Discharge Plan:  [place]  In-House Referral:     Discharge planning Services  CM Consult  Post Acute Care Choice:  Durable Medical Equipment, Home Health Choice offered to:  Patient  DME Arranged:  Walker rolling DME Agency:  [place] Arranged:  PT, OT [facility] Agency:  [place]  Status of Service:  Completed, signed off  If discussed at [place] of Stay Meetings, dates discussed:    Additional Comments:  Pollie Friar, RN 12/13/2017, 9:58 AM

## 2017-12-13 NOTE — Discharge Summary (Signed)
Physician Discharge Summary  Patient ID: Lauren Winters MRN: [id number] DOB/AGE: January 30, 1966 51 y.o.  Admit date: 12/09/2017 Discharge date: 12/13/2017  Admission Diagnoses: failed back syndrome    Discharge Diagnoses: same   Discharged Condition: stable  Hospital Course: The patient was admitted on 12/09/2017 and taken to the operating room where the patient underwent SCS. The patient tolerated the procedure well and was taken to the recovery room and then to the floor in stable condition. The hospital course was routine. Pt complained of headache and PT functional weakness of R foot, though I felt there were inconsistencies in her exam. The wound remained clean dry and intact. Pt had appropriate back soreness. No complaints of leg pain or new N/T/W. The patient remained afebrile with stable vital signs, and tolerated a regular diet. The patient continued to increase activities, and pain was well controlled with oral pain medications.   Consults: None  Significant Diagnostic Studies:  Results for orders placed or performed during the hospital encounter of 12/09/17  Glucose, capillary  Result Value Ref Range   Glucose-Capillary 247 (H) 65 - 99 mg/dL  Glucose, capillary  Result Value Ref Range   Glucose-Capillary 216 (H) 65 - 99 mg/dL   Comment 1 Notify RN    Comment 2 Document in Chart   Glucose, capillary  Result Value Ref Range   Glucose-Capillary 264 (H) 65 - 99 mg/dL  Glucose, capillary  Result Value Ref Range   Glucose-Capillary 248 (H) 65 - 99 mg/dL  Glucose, capillary  Result Value Ref Range   Glucose-Capillary 101 (H) 65 - 99 mg/dL   Comment 1 Notify RN    Comment 2 Document in Chart   Glucose, capillary  Result Value Ref Range   Glucose-Capillary 184 (H) 65 - 99 mg/dL  Glucose, capillary  Result Value Ref Range   Glucose-Capillary 98 65 - 99 mg/dL  Glucose, capillary  Result Value Ref Range   Glucose-Capillary 189 (H) 65 - 99 mg/dL  Glucose, capillary   Result Value Ref Range   Glucose-Capillary 125 (H) 65 - 99 mg/dL   Comment 1 Document in Chart   Glucose, capillary  Result Value Ref Range   Glucose-Capillary 120 (H) 65 - 99 mg/dL   Comment 1 Document in Chart   Glucose, capillary  Result Value Ref Range   Glucose-Capillary 152 (H) 65 - 99 mg/dL  Glucose, capillary  Result Value Ref Range   Glucose-Capillary 116 (H) 65 - 99 mg/dL  Glucose, capillary  Result Value Ref Range   Glucose-Capillary 78 65 - 99 mg/dL  Glucose, capillary  Result Value Ref Range   Glucose-Capillary 151 (H) 65 - 99 mg/dL  Glucose, capillary  Result Value Ref Range   Glucose-Capillary 80 65 - 99 mg/dL  Glucose, capillary  Result Value Ref Range   Glucose-Capillary 98 65 - 99 mg/dL  Glucose, capillary  Result Value Ref Range   Glucose-Capillary 138 (H) 65 - 99 mg/dL  Glucose, capillary  Result Value Ref Range   Glucose-Capillary 114 (H) 65 - 99 mg/dL   Comment 1 Notify RN    Comment 2 Document in Chart     Chest 2 View  Result Date: 12/06/2017 CLINICAL DATA:  Pre-op imaging for spinal stimulator insertion. EXAM: CHEST  2 VIEW COMPARISON:  07/22/2015 FINDINGS: Both lungs are clear. Heart and mediastinum are within normal limits. Trachea is midline. Bone structures are unremarkable. IMPRESSION: No active cardiopulmonary disease. Electronically Signed   By: Scherrie Gerlach.D.  On: 12/06/2017 14:22   Dg Thoracic Spine 1 View  Result Date: 12/09/2017 CLINICAL DATA:  Stimulator placement EXAM: OPERATIVE THORACIC SPINE 1 VIEW COMPARISON:  None. FLUOROSCOPY TIME:  0 minutes 22 seconds; one acquired image FINDINGS: Stimulator tips are positioned in the lower thoracic spine. Visualized bony structures appear normal. IMPRESSION: Dilated lead tips in lower thoracic region without complicating feature appreciable on frontal view. Electronically Signed   By: Lowella Grip III M.D.   On: 12/09/2017 09:11   Dg C-arm 1-60 Min  Result Date: 12/09/2017 CLINICAL  DATA:  Spinal cord stimulator. EXAM: DG C-ARM 61-120 MIN COMPARISON:  MRI 10/26/2017. FINDINGS: Spinal cord stimulator noted with lead tips over the lower thoracic spine. NG tube noted. No acute bony abnormality . IMPRESSION: Spinal cord stimulator noted with lead tips over the lower thoracic spine. Electronically Signed   By: Marcello Moores  Register   On: 12/09/2017 09:12    Antibiotics:  Anti-infectives (From admission, onward)   Start     Dose/Rate Route Frequency Ordered Stop   12/09/17 1600  ceFAZolin (ANCEF) IVPB 2g/100 mL premix     2 g 200 mL/hr over 30 Minutes Intravenous Every 8 hours 12/09/17 1116 12/10/17 0025   12/09/17 1200  hydroxychloroquine (PLAQUENIL) tablet 200 mg     200 mg Oral 2 times daily 12/09/17 1116     12/09/17 0858  vancomycin (VANCOCIN) powder  Status:  Discontinued       As needed 12/09/17 0858 12/09/17 0931   12/09/17 0810  bacitracin 50,000 Units in sodium chloride irrigation 0.9 % 500 mL irrigation  Status:  Discontinued       As needed 12/09/17 0810 12/09/17 0931   12/09/17 0547  ceFAZolin (ANCEF) IVPB 2g/100 mL premix     2 g 200 mL/hr over 30 Minutes Intravenous On call to O.R. 12/09/17 0547 12/09/17 0740      Discharge Exam: Blood pressure 109/87, pulse 60, temperature 97.8 F (36.6 C), temperature source Oral, resp. rate 18, height 5\' 6"  (1.676 m), weight 106.6 kg (235 lb), SpO2 98 %. Neurologic: Grossly normal, good use of LE in bed today Dressing dry  Discharge Medications:   Allergies as of 12/13/2017      Reactions   Pseudoephedrine Hcl Shortness Of Breath, Other (See Comments)   Room starts spinning, can't breath   Clindamycin/lincomycin Hives   Prednisone Other (See Comments)   Chest pain w/ cortisone shots or prednisone pills Elevated blood pressure   Adhesive [tape] Rash, Other (See Comments)   Sensitivity to certain band aids and adhesives - burns skin, makes skin raw   Other Rash, Other (See Comments)   Sensitivity to certain band aids  and adhesives - burns skin, makes skin raw   Sudafed [pseudoephedrine Hcl] Other (See Comments)   Dizziness       Medication List    STOP taking these medications   methocarbamol 500 MG tablet Commonly known as:  ROBAXIN     TAKE these medications   amLODipine 5 MG tablet Commonly known as:  NORVASC Take 1 tablet (5 mg total) by mouth 2 (two) times daily.   buPROPion 150 MG 24 hr tablet Commonly known as:  WELLBUTRIN XL Take 1 tablet (150 mg total) by mouth daily.   clobetasol cream 0.05 % Commonly known as:  TEMOVATE Apply 1 application topically 2 (two) times daily as needed (for lupis rash).   cloNIDine 0.1 MG tablet Commonly known as:  CATAPRES Take 1 tablet (0.1 mg total) by  mouth 2 (two) times daily.   cyclobenzaprine 10 MG tablet Commonly known as:  FLEXERIL Take 1 tablet (10 mg total) by mouth 3 (three) times daily as needed for muscle spasms.   diclofenac 75 MG EC tablet Commonly known as:  VOLTAREN Take 75 mg by mouth 3 (three) times daily.   escitalopram 20 MG tablet Commonly known as:  LEXAPRO Take 20 mg by mouth daily.   folic acid 1 MG tablet Commonly known as:  FOLVITE Take 2 tablets (2 mg total) by mouth daily.   gabapentin 300 MG capsule Commonly known as:  NEURONTIN Take 600 mg by mouth 3 (three) times daily.   glipiZIDE 10 MG tablet Commonly known as:  GLUCOTROL Take 1 tablet (10 mg total) 2 (two) times daily before a meal by mouth.   Halcinonide 0.1 % Oint Apply 1 application topically 2 (two) times daily as needed (for lupis rash).   HYDROcodone-acetaminophen 7.5-325 MG tablet Commonly known as:  NORCO Take 1 tablet by mouth every 4 (four) hours as needed for moderate pain.   hydroxychloroquine 200 MG tablet Commonly known as:  PLAQUENIL Take 1 tablet (200 mg total) by mouth 2 (two) times daily.   hydrOXYzine 25 MG tablet Commonly known as:  ATARAX/VISTARIL Take 1 tablet (25 mg total) by mouth 2 (two) times daily as needed for  anxiety. What changed:  when to take this   magic mouthwash w/lidocaine Soln diphenhydramine/ 2%  VISCOUS lidocaine /antacid-MAALOX (1:1:1); 15 mL swished in the mouth for 30 sec, then spit out.    May be used every 3 to 4 h   metFORMIN 1000 MG tablet Commonly known as:  GLUCOPHAGE Take 1 tablet (1,000 mg total) by mouth 2 (two) times daily with a meal.   methotrexate 50 MG/2ML injection Inject 20 mg into the muscle every Tuesday.   metoprolol tartrate 100 MG tablet Commonly known as:  LOPRESSOR Take 1 tablet (100 mg total) by mouth 2 (two) times daily.   pilocarpine 5 MG tablet Commonly known as:  SALAGEN Take 5 mg by mouth daily as needed (for dry mouth).   tacrolimus 0.1 % ointment Commonly known as:  PROTOPIC Apply 1 application topically 2 (two) times daily as needed (for lupis rash).   triamcinolone 0.1 % cream : eucerin Crea Apply 1 application topically 2 (two) times daily as needed for rash or irritation.   valsartan-hydrochlorothiazide 320-25 MG tablet Commonly known as:  DIOVAN-HCT TAKE 1 TABLET BY MOUTH DAILY.   Vitamin D (Ergocalciferol) 50000 units Caps capsule Commonly known as:  DRISDOL Take 1 capsule (50,000 Units total) by mouth 2 (two) times a week.            Durable Medical Equipment  (From admission, onward)        Start     Ordered   12/13/17 0756  For home use only DME [place] R[facility]  [facility])  Once    Question:  Patient needs a walker to treat with the following condition  Answer:  Gait disturbance   12/13/17 0755      Disposition: home   Final Dx: SCS  Discharge Instructions    Call MD for:  difficulty breathing, headache or visual disturbances   Complete by:  As directed    Call MD for:  persistant nausea and vomiting   Complete by:  As directed    Call MD for:  redness, tenderness, or signs of infection (pain, swelling, redness, odor or green/yellow discharge around incision site)  Complete by:  As directed    Call MD for:  severe  uncontrolled pain   Complete by:  As directed    Call MD for:  temperature >100.4   Complete by:  As directed    Diet - low sodium heart healthy   Complete by:  As directed    Increase activity slowly   Complete by:  As directed    Remove dressing in 48 hours   Complete by:  As directed          Signed: Christine Schiefelbein S 12/13/2017, 7:56 AM

## 2017-12-15 ENCOUNTER — Ambulatory Visit: Payer: BLUE CROSS/BLUE SHIELD | Admitting: Psychology

## 2017-12-15 ENCOUNTER — Encounter (HOSPITAL_COMMUNITY): Payer: Self-pay | Admitting: Neurological Surgery

## 2017-12-19 ENCOUNTER — Encounter (HOSPITAL_COMMUNITY): Payer: Self-pay | Admitting: Neurological Surgery

## 2017-12-21 NOTE — Progress Notes (Deleted)
Office Visit Note  Patient: Lauren Winters             Date of Birth: Jun 09, 1966           MRN: [id number]             PCP: Pleas Koch, NP Referring: Pleas Koch, NP Visit Date: 12/26/2017 Occupation: @GUAROCC @    Subjective:  No chief complaint on file.   History of Present Illness: Lauren Winters is a 51 y.o. female ***   Activities of Daily Living:  Patient reports morning stiffness for *** {minute/hour:19697}.   Patient {ACTIONS;DENIES/REPORTS:21021675::"Denies"} nocturnal pain.  Difficulty dressing/grooming: {ACTIONS;DENIES/REPORTS:21021675::"Denies"} Difficulty climbing stairs: {ACTIONS;DENIES/REPORTS:21021675::"Denies"} Difficulty getting out of chair: {ACTIONS;DENIES/REPORTS:21021675::"Denies"} Difficulty using hands for taps, buttons, cutlery, and/or writing: {ACTIONS;DENIES/REPORTS:21021675::"Denies"}   No Rheumatology ROS completed.   PMFS History:  Patient Active Problem List   Diagnosis Date Noted  . S/P lumbar laminectomy 12/09/2017  . Paresthesia 05/11/2017  . HNP (herniated nucleus pulposus), lumbar 03/29/2017  . Chronic right shoulder pain 02/18/2017  . Primary osteoarthritis of both hands 12/22/2016  . Primary osteoarthritis of both knees 12/22/2016  . Spondylosis of lumbar region without myelopathy or radiculopathy 12/22/2016  . Fibromyalgia 12/22/2016  . Recurrent oral ulcers 12/22/2016  . Preventative health care 04/19/2016  . Chronic back pain 12/17/2015  . Hyperlipidemia 08/29/2015  . Lupus (systemic lupus erythematosus) ([place]) 08/04/2015  . Rheumatoid factor positive 08/04/2015  . Sjogren's syndrome ([place]) 08/04/2015  . Mitral regurgitation   . Tobacco abuse   . Atypical chest pain   . Obesity (BMI 30-39.9)   . Anxiety   . Hypertension   . Diabetes mellitus without complication ([place])   . Clinical depression 05/23/2014    Past Medical History:  Diagnosis Date  . Anxiety   . Atypical chest pain    a. ETT 05/2014:  normal stress test without evidence of myocardial ischemia or chest pain at peak stress  . Complication of anesthesia    migraine headache  . Depression   . Diabetes mellitus without complication ([place]) metformin   dx 2015  . Dyspnea    on exertion  . Fibromyalgia   . Headache   . Heart murmur    told back in 2007, but no mention with 2016 cardiac w/u  . Hypertension   . Lupus (systemic lupus erythematosus) ([place])   . Mitral regurgitation    a. 05/2014 echo: EF >55%, nl LV/RV sys fxn, mild MR/AR, no valvular stenosis   . Obesity (BMI 30-39.9)   . Raynaud's phenomenon   . Rheumatoid arthritis ([place])   . Sjogren's disease ([place])   . Tobacco abuse    a. ongoing; b. ongoing SOB    Family History  Problem Relation Age of Onset  . Hypertension Mother   . Valvular heart disease Mother   . CAD Father 48       CABG x 4  . Liver cancer Father 77       passed  . CAD Paternal Aunt        CABG  . Breast cancer Paternal Aunt   . CAD Paternal Aunt        CABG  . Breast cancer Paternal Aunt   . CAD Paternal Aunt        CABG  . Breast cancer Paternal Aunt   . CAD Paternal Aunt        CABG  . Breast cancer Paternal Aunt   . CAD Paternal 61  CABG  . Breast cancer Paternal Aunt   . CAD Paternal Uncle        MI s/p CABG  . Stroke Maternal Grandmother   . Stroke Maternal Grandfather   . CAD Maternal Grandfather    Past Surgical History:  Procedure Laterality Date  . ABDOMINAL HYSTERECTOMY    . CESAREAN SECTION     x3  . LUMBAR LAMINECTOMY    . LUMBAR LAMINECTOMY/DECOMPRESSION MICRODISCECTOMY N/A 03/29/2017   Procedure: Re operative Right Lumbar Three-Four Laminectomy and discectomy with Left Lumbar Three-Four, Bilateral Lumbar Four-Five Laminectomy for decompression;  Surgeon: Kevan Ny Ditty, MD;  Location: [facility];  Service: Neurosurgery;  Laterality: N/A;  . SPINAL CORD STIMULATOR INSERTION  12/09/2017   Procedure: LUMBAR SPINAL CORD STIMULATOR IMPLANT;  Surgeon: Eustace Moore, MD;  Location: [place];  Service: Neurosurgery;;  . TUBAL LIGATION     Social History   Social History Narrative   Married.   3 children.   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      Objective: Vital Signs: There were no vitals taken for this visit.   Physical Exam   Musculoskeletal Exam: ***  CDAI Exam: No CDAI exam completed.    Investigation: No additional findings.PLQ eye exam: 11/2016 CBC Latest Ref Rng & Units 12/06/2017 07/18/2017 03/30/2017  WBC 4.0 - 10.5 K/uL 7.9 5.5 12.0(H)  Hemoglobin 12.0 - 15.0 g/dL 13.4 13.4 12.6  Hematocrit 36.0 - 46.0 % 39.0 39.8 37.7  Platelets 150 - 400 K/uL 291 318 289   CMP Latest Ref Rng & Units 12/06/2017 07/18/2017 03/30/2017  Glucose 65 - 99 mg/dL 360(H) 141(H) 197(H)  BUN 6 - 20 mg/dL 12 10 12   Creatinine 0.44 - 1.00 mg/dL 0.85 0.80 0.87  Sodium 135 - 145 mmol/L 133(L) 138 137  Potassium 3.5 - 5.1 mmol/L 4.0 4.1 4.5  Chloride 101 - 111 mmol/L 98(L) 98 100(L)  CO2 22 - 32 mmol/L 25 28 30   Calcium 8.9 - 10.3 mg/dL 8.9 9.5 9.1  Total Protein 6.1 - 8.1 g/dL - 7.7 -  Total Bilirubin 0.2 - 1.2 mg/dL - 0.5 -  Alkaline Phos 33 - 130 U/L - 127 -  AST 10 - 35 U/L - 19 -  ALT 6 - 29 U/L - 11 -    Imaging: Chest 2 View  Result Date: 12/06/2017 CLINICAL DATA:  Pre-op imaging for spinal stimulator insertion. EXAM: CHEST  2 VIEW COMPARISON:  07/22/2015 FINDINGS: Both lungs are clear. Heart and mediastinum are within normal limits. Trachea is midline. Bone structures are unremarkable. IMPRESSION: No active cardiopulmonary disease. Electronically Signed   By: Markus Daft M.D.   On: 12/06/2017 14:22   Dg Thoracic Spine 1 View  Result Date: 12/09/2017 CLINICAL DATA:  Stimulator placement EXAM: OPERATIVE THORACIC SPINE 1 VIEW COMPARISON:  None. FLUOROSCOPY TIME:  0 minutes 22 seconds; one acquired image FINDINGS: Stimulator tips are positioned in the lower thoracic spine. Visualized bony structures  appear normal. IMPRESSION: Dilated lead tips in lower thoracic region without complicating feature appreciable on frontal view. Electronically Signed   By: Lowella Grip III M.D.   On: 12/09/2017 09:11   Dg C-arm 1-60 Min  Result Date: 12/09/2017 CLINICAL DATA:  Spinal cord stimulator. EXAM: DG C-ARM 61-120 MIN COMPARISON:  MRI 10/26/2017. FINDINGS: Spinal cord stimulator noted with lead tips over the lower thoracic spine. NG tube noted. No acute bony abnormality . IMPRESSION: Spinal cord stimulator noted with lead tips over  the lower thoracic spine. Electronically Signed   By: Marcello Moores  Register   On: 12/09/2017 09:12    Speciality Comments: No specialty comments available.    Procedures:  No procedures performed Allergies: Pseudoephedrine hcl; Clindamycin/lincomycin; Prednisone; Adhesive [tape]; Other; and Sudafed [pseudoephedrine hcl]   Assessment / Plan:     Visit Diagnoses: No diagnosis found.    Orders: No orders of the defined types were placed in this encounter.  No orders of the defined types were placed in this encounter.   Face-to-face time spent with patient was *** minutes. 50% of time was spent in counseling and coordination of care.  Follow-Up Instructions: No Follow-up on file.   Earnestine Mealing, CMA  Note - This record has been created using Editor, commissioning.  Chart creation errors have been sought, but may not always  have been located. Such creation errors do not reflect on  the standard of medical care.

## 2017-12-22 ENCOUNTER — Ambulatory Visit: Payer: Self-pay | Admitting: Psychology

## 2017-12-26 ENCOUNTER — Ambulatory Visit: Payer: BLUE CROSS/BLUE SHIELD | Admitting: Rheumatology

## 2017-12-29 ENCOUNTER — Ambulatory Visit: Payer: Self-pay | Admitting: Psychology

## 2018-01-05 ENCOUNTER — Ambulatory Visit (INDEPENDENT_AMBULATORY_CARE_PROVIDER_SITE_OTHER): Payer: BLUE CROSS/BLUE SHIELD | Admitting: Psychology

## 2018-01-05 DIAGNOSIS — F331 Major depressive disorder, recurrent, moderate: Secondary | ICD-10-CM

## 2018-01-05 DIAGNOSIS — F410 Panic disorder [episodic paroxysmal anxiety] without agoraphobia: Secondary | ICD-10-CM | POA: Diagnosis not present

## 2018-01-11 ENCOUNTER — Emergency Department (HOSPITAL_COMMUNITY): Payer: BLUE CROSS/BLUE SHIELD | Admitting: Anesthesiology

## 2018-01-11 ENCOUNTER — Other Ambulatory Visit: Payer: Self-pay

## 2018-01-11 ENCOUNTER — Inpatient Hospital Stay (HOSPITAL_COMMUNITY)
Admission: EM | Admit: 2018-01-11 | Discharge: 2018-01-16 | DRG: 030 | Disposition: A | Discharge status: Home Health | Payer: BLUE CROSS/BLUE SHIELD | Attending: Neurological Surgery | Admitting: Neurological Surgery

## 2018-01-11 ENCOUNTER — Emergency Department (HOSPITAL_COMMUNITY): Payer: BLUE CROSS/BLUE SHIELD

## 2018-01-11 ENCOUNTER — Encounter (HOSPITAL_COMMUNITY): Payer: Self-pay

## 2018-01-11 ENCOUNTER — Encounter (HOSPITAL_COMMUNITY)
Admission: EM | Disposition: A | Discharge status: Home Health | Payer: Self-pay | Source: Home / Self Care | Attending: Neurological Surgery

## 2018-01-11 DIAGNOSIS — M069 Rheumatoid arthritis, unspecified: Secondary | ICD-10-CM | POA: Diagnosis present

## 2018-01-11 DIAGNOSIS — F329 Major depressive disorder, single episode, unspecified: Secondary | ICD-10-CM | POA: Diagnosis present

## 2018-01-11 DIAGNOSIS — Z87891 Personal history of nicotine dependence: Secondary | ICD-10-CM | POA: Diagnosis not present

## 2018-01-11 DIAGNOSIS — M797 Fibromyalgia: Secondary | ICD-10-CM | POA: Diagnosis present

## 2018-01-11 DIAGNOSIS — F419 Anxiety disorder, unspecified: Secondary | ICD-10-CM | POA: Diagnosis present

## 2018-01-11 DIAGNOSIS — I340 Nonrheumatic mitral (valve) insufficiency: Secondary | ICD-10-CM | POA: Diagnosis present

## 2018-01-11 DIAGNOSIS — E1151 Type 2 diabetes mellitus with diabetic peripheral angiopathy without gangrene: Secondary | ICD-10-CM | POA: Diagnosis present

## 2018-01-11 DIAGNOSIS — R3911 Hesitancy of micturition: Secondary | ICD-10-CM | POA: Diagnosis present

## 2018-01-11 DIAGNOSIS — Z91048 Other nonmedicinal substance allergy status: Secondary | ICD-10-CM | POA: Diagnosis not present

## 2018-01-11 DIAGNOSIS — B9561 Methicillin susceptible Staphylococcus aureus infection as the cause of diseases classified elsewhere: Secondary | ICD-10-CM | POA: Diagnosis present

## 2018-01-11 DIAGNOSIS — Z79899 Other long term (current) drug therapy: Secondary | ICD-10-CM | POA: Diagnosis not present

## 2018-01-11 DIAGNOSIS — T8149XA Infection following a procedure, other surgical site, initial encounter: Secondary | ICD-10-CM

## 2018-01-11 DIAGNOSIS — Z6835 Body mass index (BMI) 35.0-35.9, adult: Secondary | ICD-10-CM

## 2018-01-11 DIAGNOSIS — M329 Systemic lupus erythematosus, unspecified: Secondary | ICD-10-CM | POA: Diagnosis present

## 2018-01-11 DIAGNOSIS — T85734A Infection and inflammatory reaction due to implanted electronic neurostimulator, generator, initial encounter: Principal | ICD-10-CM | POA: Diagnosis present

## 2018-01-11 DIAGNOSIS — I7300 Raynaud's syndrome without gangrene: Secondary | ICD-10-CM | POA: Diagnosis present

## 2018-01-11 DIAGNOSIS — I10 Essential (primary) hypertension: Secondary | ICD-10-CM | POA: Diagnosis present

## 2018-01-11 DIAGNOSIS — T8140XA Infection following a procedure, unspecified, initial encounter: Secondary | ICD-10-CM

## 2018-01-11 DIAGNOSIS — E669 Obesity, unspecified: Secondary | ICD-10-CM | POA: Diagnosis present

## 2018-01-11 DIAGNOSIS — M3500 Sicca syndrome, unspecified: Secondary | ICD-10-CM | POA: Diagnosis present

## 2018-01-11 DIAGNOSIS — Z888 Allergy status to other drugs, medicaments and biological substances status: Secondary | ICD-10-CM

## 2018-01-11 DIAGNOSIS — Z7984 Long term (current) use of oral hypoglycemic drugs: Secondary | ICD-10-CM

## 2018-01-11 DIAGNOSIS — Z881 Allergy status to other antibiotic agents status: Secondary | ICD-10-CM

## 2018-01-11 HISTORY — PX: SPINAL CORD STIMULATOR REMOVAL: SHX5379

## 2018-01-11 HISTORY — DX: Infection following a procedure, other surgical site, initial encounter: T81.49XA

## 2018-01-11 LAB — PROTIME-INR
INR: 1
Prothrombin Time: 13.1 seconds (ref 11.4–15.2)

## 2018-01-11 LAB — COMPREHENSIVE METABOLIC PANEL
ALBUMIN: 3.5 g/dL (ref 3.5–5.0)
ALT: 13 U/L — ABNORMAL LOW (ref 14–54)
AST: 20 U/L (ref 15–41)
Alkaline Phosphatase: 109 U/L (ref 38–126)
Anion gap: 12 (ref 5–15)
BUN: 15 mg/dL (ref 6–20)
CHLORIDE: 100 mmol/L — AB (ref 101–111)
CO2: 25 mmol/L (ref 22–32)
Calcium: 9.2 mg/dL (ref 8.9–10.3)
Creatinine, Ser: 0.8 mg/dL (ref 0.44–1.00)
GFR calc Af Amer: >60 mL/min (ref >60)
GLUCOSE: 207 mg/dL — AB (ref 65–99)
POTASSIUM: 3.8 mmol/L (ref 3.5–5.1)
Sodium: 137 mmol/L (ref 135–145)
Total Bilirubin: 0.8 mg/dL (ref 0.3–1.2)
Total Protein: 7.7 g/dL (ref 6.5–8.1)

## 2018-01-11 LAB — URINALYSIS, ROUTINE W REFLEX MICROSCOPIC
BILIRUBIN URINE: NEGATIVE
GLUCOSE, UA: 150 mg/dL — AB
HGB URINE DIPSTICK: NEGATIVE
KETONES UR: NEGATIVE mg/dL
NITRITE: NEGATIVE
PROTEIN: 30 mg/dL — AB
Specific Gravity, Urine: 1.031 — ABNORMAL HIGH (ref 1.005–1.030)
pH: 5 (ref 5.0–8.0)

## 2018-01-11 LAB — I-STAT BETA HCG BLOOD, ED (MC, WL, AP ONLY): I-stat hCG, quantitative: <5 m[IU]/mL (ref <5)

## 2018-01-11 LAB — GLUCOSE, CAPILLARY
Glucose-Capillary: 135 mg/dL — ABNORMAL HIGH (ref 65–99)
Glucose-Capillary: 126 mg/dL — ABNORMAL HIGH (ref 65–99)
Glucose-Capillary: 150 mg/dL — ABNORMAL HIGH (ref 65–99)

## 2018-01-11 LAB — CBC WITH DIFFERENTIAL/PLATELET
BASOS ABS: 0 10*3/uL (ref 0.0–0.1)
Basophils Relative: 0 %
Eosinophils Absolute: 0.3 10*3/uL (ref 0.0–0.7)
Eosinophils Relative: 3 %
HEMATOCRIT: 37.1 % (ref 36.0–46.0)
Hemoglobin: 12.3 g/dL (ref 12.0–15.0)
LYMPHS PCT: 17 %
Lymphs Abs: 1.9 10*3/uL (ref 0.7–4.0)
MCH: 27.5 pg (ref 26.0–34.0)
MCHC: 33.2 g/dL (ref 30.0–36.0)
MCV: 83 fL (ref 78.0–100.0)
MONO ABS: 0.8 10*3/uL (ref 0.1–1.0)
Monocytes Relative: 7 %
Neutro Abs: 7.9 10*3/uL — ABNORMAL HIGH (ref 1.7–7.7)
Neutrophils Relative %: 73 %
Platelets: 355 10*3/uL (ref 150–400)
RBC: 4.47 MIL/uL (ref 3.87–5.11)
RDW: 13.3 % (ref 11.5–15.5)
WBC: 10.9 10*3/uL — ABNORMAL HIGH (ref 4.0–10.5)

## 2018-01-11 LAB — I-STAT CG4 LACTIC ACID, ED
LACTIC ACID, VENOUS: 1.31 mmol/L (ref 0.5–1.9)
Lactic Acid, Venous: 0.93 mmol/L (ref 0.5–1.9)

## 2018-01-11 LAB — I-STAT TROPONIN, ED: TROPONIN I, POC: 0 ng/mL (ref 0.00–0.08)

## 2018-01-11 SURGERY — LUMBAR SPINAL CORD STIMULATOR REMOVAL
Anesthesia: General | Site: Back

## 2018-01-11 MED ORDER — ONDANSETRON HCL 4 MG/2ML IJ SOLN
INTRAMUSCULAR | Status: AC
  Filled 2018-01-11: qty 2

## 2018-01-11 MED ORDER — SUGAMMADEX SODIUM 200 MG/2ML IV SOLN
INTRAVENOUS | Status: DC | PRN
  Administered 2018-01-11: 202.4 mg via INTRAVENOUS

## 2018-01-11 MED ORDER — HYDROMORPHONE HCL 1 MG/ML IJ SOLN
0.5000 mg | INTRAMUSCULAR | Status: DC | PRN
  Administered 2018-01-13: 0.5 mg via INTRAVENOUS
  Filled 2018-01-11: qty 0.5

## 2018-01-11 MED ORDER — ONDANSETRON HCL 4 MG/2ML IJ SOLN
INTRAMUSCULAR | Status: DC | PRN
  Administered 2018-01-11: 4 mg via INTRAVENOUS

## 2018-01-11 MED ORDER — PROPOFOL 10 MG/ML IV BOLUS
INTRAVENOUS | Status: DC | PRN
  Administered 2018-01-11: 200 mg via INTRAVENOUS

## 2018-01-11 MED ORDER — SODIUM CHLORIDE 0.9% FLUSH
3.0000 mL | Freq: Two times a day (BID) | INTRAVENOUS | Status: DC
  Administered 2018-01-12 – 2018-01-15 (×3): 3 mL via INTRAVENOUS

## 2018-01-11 MED ORDER — VANCOMYCIN HCL IN DEXTROSE 1-5 GM/200ML-% IV SOLN
INTRAVENOUS | Status: AC
  Filled 2018-01-11: qty 200

## 2018-01-11 MED ORDER — BUPROPION HCL ER (XL) 150 MG PO TB24
150.0000 mg | ORAL_TABLET | Freq: Every day | ORAL | Status: DC
  Administered 2018-01-12 – 2018-01-16 (×4): 150 mg via ORAL
  Filled 2018-01-11 (×7): qty 1

## 2018-01-11 MED ORDER — CYCLOBENZAPRINE HCL 10 MG PO TABS
10.0000 mg | ORAL_TABLET | Freq: Three times a day (TID) | ORAL | Status: DC | PRN
  Administered 2018-01-11 – 2018-01-12 (×2): 10 mg via ORAL
  Filled 2018-01-11 (×2): qty 1

## 2018-01-11 MED ORDER — PROPOFOL 10 MG/ML IV BOLUS: INTRAVENOUS | Status: DC | PRN

## 2018-01-11 MED ORDER — HYDROMORPHONE HCL 1 MG/ML IJ SOLN
0.2500 mg | INTRAMUSCULAR | Status: DC | PRN
  Administered 2018-01-11 (×4): 0.5 mg via INTRAVENOUS

## 2018-01-11 MED ORDER — MIDAZOLAM HCL 5 MG/5ML IJ SOLN: INTRAMUSCULAR | Status: DC | PRN

## 2018-01-11 MED ORDER — LACTATED RINGERS IV SOLN
INTRAVENOUS | Status: DC | PRN
  Administered 2018-01-11: 19:00:00 via INTRAVENOUS

## 2018-01-11 MED ORDER — GLIPIZIDE 5 MG PO TABS
10.0000 mg | ORAL_TABLET | Freq: Two times a day (BID) | ORAL | Status: DC
  Administered 2018-01-12: 10 mg via ORAL
  Filled 2018-01-11 (×2): qty 2

## 2018-01-11 MED ORDER — HYDROCODONE-ACETAMINOPHEN 7.5-325 MG PO TABS
ORAL_TABLET | ORAL | Status: AC
  Filled 2018-01-11: qty 1

## 2018-01-11 MED ORDER — PHENOL 1.4 % MT LIQD: 1.0000 | OROMUCOSAL | Status: DC | PRN

## 2018-01-11 MED ORDER — MORPHINE SULFATE (PF) 4 MG/ML IV SOLN
4.0000 mg | Freq: Once | INTRAVENOUS | Status: AC
  Administered 2018-01-11: 4 mg via INTRAVENOUS
  Filled 2018-01-11: qty 1

## 2018-01-11 MED ORDER — DEXTROSE 5 % IV SOLN
2.0000 g | Freq: Once | INTRAVENOUS | Status: AC
  Administered 2018-01-11: 2 g via INTRAVENOUS
  Filled 2018-01-11: qty 2

## 2018-01-11 MED ORDER — LIDOCAINE HCL (CARDIAC) 20 MG/ML IV SOLN
INTRAVENOUS | Status: DC | PRN
  Administered 2018-01-11: 50 mg via INTRAVENOUS

## 2018-01-11 MED ORDER — ROCURONIUM BROMIDE 10 MG/ML (PF) SYRINGE
PREFILLED_SYRINGE | INTRAVENOUS | Status: AC
  Filled 2018-01-11: qty 5

## 2018-01-11 MED ORDER — SODIUM CHLORIDE 0.9 % IV SOLN
Freq: Once | INTRAVENOUS | Status: AC
  Administered 2018-01-11: 10:00:00 via INTRAVENOUS

## 2018-01-11 MED ORDER — ACETAMINOPHEN 650 MG RE SUPP: 650.0000 mg | RECTAL | Status: DC | PRN

## 2018-01-11 MED ORDER — SODIUM CHLORIDE 0.9 % IV SOLN: 250.0000 mL | INTRAVENOUS | Status: DC

## 2018-01-11 MED ORDER — SENNA 8.6 MG PO TABS
1.0000 | ORAL_TABLET | Freq: Two times a day (BID) | ORAL | Status: DC
  Administered 2018-01-12 – 2018-01-16 (×10): 8.6 mg via ORAL
  Filled 2018-01-11 (×10): qty 1

## 2018-01-11 MED ORDER — VANCOMYCIN HCL 1000 MG IV SOLR
INTRAVENOUS | Status: DC | PRN
  Administered 2018-01-11: 1000 mg via INTRAVENOUS

## 2018-01-11 MED ORDER — ESCITALOPRAM OXALATE 10 MG PO TABS
20.0000 mg | ORAL_TABLET | Freq: Every day | ORAL | Status: DC
  Administered 2018-01-12 – 2018-01-16 (×5): 20 mg via ORAL
  Filled 2018-01-11 (×5): qty 2

## 2018-01-11 MED ORDER — MIDAZOLAM HCL 5 MG/5ML IJ SOLN
INTRAMUSCULAR | Status: DC | PRN
  Administered 2018-01-11: 2 mg via INTRAVENOUS

## 2018-01-11 MED ORDER — HYDROCODONE-ACETAMINOPHEN 7.5-325 MG PO TABS
1.0000 | ORAL_TABLET | ORAL | Status: DC | PRN
  Administered 2018-01-11 – 2018-01-16 (×13): 1 via ORAL
  Filled 2018-01-11 (×12): qty 1

## 2018-01-11 MED ORDER — MIDAZOLAM HCL 2 MG/2ML IJ SOLN
INTRAMUSCULAR | Status: AC
  Filled 2018-01-11: qty 2

## 2018-01-11 MED ORDER — AMLODIPINE BESYLATE 5 MG PO TABS
5.0000 mg | ORAL_TABLET | Freq: Two times a day (BID) | ORAL | Status: DC
  Administered 2018-01-12 – 2018-01-16 (×6): 5 mg via ORAL
  Filled 2018-01-11 (×5): qty 1
  Filled 2018-01-11: qty 2
  Filled 2018-01-11: qty 1

## 2018-01-11 MED ORDER — IRBESARTAN 300 MG PO TABS
300.0000 mg | ORAL_TABLET | Freq: Every day | ORAL | Status: DC
  Administered 2018-01-12 – 2018-01-16 (×4): 300 mg via ORAL
  Filled 2018-01-11 (×5): qty 1

## 2018-01-11 MED ORDER — LACTATED RINGERS IV SOLN
INTRAVENOUS | Status: DC
  Administered 2018-01-11: 19:00:00 via INTRAVENOUS

## 2018-01-11 MED ORDER — TACROLIMUS 0.1 % EX OINT
1.0000 | TOPICAL_OINTMENT | Freq: Two times a day (BID) | CUTANEOUS | Status: DC | PRN
Start: 2018-01-11 — End: 2018-01-16
  Filled 2018-01-11: qty 1

## 2018-01-11 MED ORDER — BACITRACIN 50000 UNITS IM SOLR
INTRAMUSCULAR | Status: DC | PRN
  Administered 2018-01-11: 500 mL

## 2018-01-11 MED ORDER — HYDROCHLOROTHIAZIDE 25 MG PO TABS
25.0000 mg | ORAL_TABLET | Freq: Every day | ORAL | Status: DC
  Administered 2018-01-12 – 2018-01-16 (×4): 25 mg via ORAL
  Filled 2018-01-11 (×5): qty 1

## 2018-01-11 MED ORDER — VANCOMYCIN HCL 1000 MG IV SOLR
INTRAVENOUS | Status: DC | PRN
Start: 2018-01-11 — End: 2018-01-11
  Administered 2018-01-11: 1000 mg via TOPICAL

## 2018-01-11 MED ORDER — POTASSIUM CHLORIDE IN NACL 20-0.9 MEQ/L-% IV SOLN
INTRAVENOUS | Status: DC
  Administered 2018-01-12: 01:00:00 via INTRAVENOUS
  Filled 2018-01-11: qty 1000

## 2018-01-11 MED ORDER — OXYCODONE-ACETAMINOPHEN 5-325 MG PO TABS
1.0000 | ORAL_TABLET | Freq: Once | ORAL | Status: AC
  Administered 2018-01-11: 1 via ORAL
  Filled 2018-01-11: qty 1

## 2018-01-11 MED ORDER — VANCOMYCIN HCL 10 G IV SOLR
1250.0000 mg | Freq: Two times a day (BID) | INTRAVENOUS | Status: DC
  Administered 2018-01-12 – 2018-01-13 (×3): 1250 mg via INTRAVENOUS
  Filled 2018-01-11 (×5): qty 1250

## 2018-01-11 MED ORDER — DICLOFENAC SODIUM 75 MG PO TBEC
75.0000 mg | DELAYED_RELEASE_TABLET | Freq: Three times a day (TID) | ORAL | Status: DC
  Administered 2018-01-12 – 2018-01-16 (×14): 75 mg via ORAL
  Filled 2018-01-11 (×16): qty 1

## 2018-01-11 MED ORDER — ACETAMINOPHEN 325 MG PO TABS: 650.0000 mg | ORAL_TABLET | ORAL | Status: DC | PRN

## 2018-01-11 MED ORDER — PROMETHAZINE HCL 25 MG/ML IJ SOLN: 6.2500 mg | INTRAMUSCULAR | Status: DC | PRN

## 2018-01-11 MED ORDER — HYDROMORPHONE HCL 1 MG/ML IJ SOLN
INTRAMUSCULAR | Status: AC
  Administered 2018-01-11: 0.5 mg via INTRAVENOUS
  Filled 2018-01-11: qty 1

## 2018-01-11 MED ORDER — DEXTROSE 5 % IV SOLN
2.0000 g | INTRAVENOUS | Status: DC
  Administered 2018-01-12 – 2018-01-13 (×2): 2 g via INTRAVENOUS
  Filled 2018-01-11 (×3): qty 2

## 2018-01-11 MED ORDER — SULFAMETHOXAZOLE-TRIMETHOPRIM 800-160 MG PO TABS
1.0000 | ORAL_TABLET | Freq: Once | ORAL | Status: AC
  Administered 2018-01-11: 1 via ORAL
  Filled 2018-01-11: qty 1

## 2018-01-11 MED ORDER — 0.9 % SODIUM CHLORIDE (POUR BTL) OPTIME
TOPICAL | Status: DC | PRN
  Administered 2018-01-11: 1000 mL

## 2018-01-11 MED ORDER — FLUOCINONIDE 0.05 % EX CREA
TOPICAL_CREAM | Freq: Two times a day (BID) | CUTANEOUS | Status: DC
  Administered 2018-01-12: 1 via TOPICAL
  Administered 2018-01-14: 08:00:00 via TOPICAL
  Filled 2018-01-11: qty 30

## 2018-01-11 MED ORDER — SUGAMMADEX SODIUM 500 MG/5ML IV SOLN
INTRAVENOUS | Status: AC
  Filled 2018-01-11: qty 5

## 2018-01-11 MED ORDER — CYCLOBENZAPRINE HCL 10 MG PO TABS
ORAL_TABLET | ORAL | Status: AC
  Filled 2018-01-11: qty 1

## 2018-01-11 MED ORDER — METOPROLOL TARTRATE 50 MG PO TABS
100.0000 mg | ORAL_TABLET | Freq: Two times a day (BID) | ORAL | Status: DC
  Administered 2018-01-12 – 2018-01-16 (×9): 100 mg via ORAL
  Filled 2018-01-11 (×9): qty 2

## 2018-01-11 MED ORDER — HYDROXYCHLOROQUINE SULFATE 200 MG PO TABS
200.0000 mg | ORAL_TABLET | Freq: Two times a day (BID) | ORAL | Status: DC
  Administered 2018-01-12 – 2018-01-16 (×10): 200 mg via ORAL
  Filled 2018-01-11 (×10): qty 1

## 2018-01-11 MED ORDER — VALSARTAN-HYDROCHLOROTHIAZIDE 320-25 MG PO TABS: 1.0000 | ORAL_TABLET | Freq: Every day | ORAL | Status: DC

## 2018-01-11 MED ORDER — CLONIDINE HCL 0.1 MG PO TABS
0.1000 mg | ORAL_TABLET | Freq: Two times a day (BID) | ORAL | Status: DC
  Administered 2018-01-12 – 2018-01-16 (×6): 0.1 mg via ORAL
  Filled 2018-01-11 (×7): qty 1

## 2018-01-11 MED ORDER — INSULIN ASPART 100 UNIT/ML ~~LOC~~ SOLN
0.0000 [IU] | Freq: Three times a day (TID) | SUBCUTANEOUS | Status: DC
  Administered 2018-01-12 – 2018-01-13 (×2): 3 [IU] via SUBCUTANEOUS
  Administered 2018-01-14 – 2018-01-15 (×3): 2 [IU] via SUBCUTANEOUS

## 2018-01-11 MED ORDER — ROCURONIUM BROMIDE 100 MG/10ML IV SOLN
INTRAVENOUS | Status: DC | PRN
  Administered 2018-01-11: 50 mg via INTRAVENOUS

## 2018-01-11 MED ORDER — SUGAMMADEX SODIUM 200 MG/2ML IV SOLN
INTRAVENOUS | Status: AC
  Filled 2018-01-11: qty 2

## 2018-01-11 MED ORDER — LIDOCAINE HCL (CARDIAC) 20 MG/ML IV SOLN: INTRAVENOUS | Status: DC | PRN

## 2018-01-11 MED ORDER — LIDOCAINE 2% (20 MG/ML) 5 ML SYRINGE
INTRAMUSCULAR | Status: AC
  Filled 2018-01-11: qty 20

## 2018-01-11 MED ORDER — MENTHOL 3 MG MT LOZG: 1.0000 | LOZENGE | OROMUCOSAL | Status: DC | PRN

## 2018-01-11 MED ORDER — SODIUM CHLORIDE 0.9% FLUSH: 3.0000 mL | INTRAVENOUS | Status: DC | PRN

## 2018-01-11 MED ORDER — GABAPENTIN 300 MG PO CAPS
600.0000 mg | ORAL_CAPSULE | Freq: Three times a day (TID) | ORAL | Status: DC
  Administered 2018-01-12 – 2018-01-16 (×13): 600 mg via ORAL
  Filled 2018-01-11 (×15): qty 2

## 2018-01-11 MED ORDER — ONDANSETRON HCL 4 MG/2ML IJ SOLN
4.0000 mg | Freq: Once | INTRAMUSCULAR | Status: AC
  Administered 2018-01-11: 4 mg via INTRAVENOUS
  Filled 2018-01-11: qty 2

## 2018-01-11 MED ORDER — METFORMIN HCL 500 MG PO TABS
1000.0000 mg | ORAL_TABLET | Freq: Two times a day (BID) | ORAL | Status: DC
  Administered 2018-01-12 – 2018-01-16 (×8): 1000 mg via ORAL
  Filled 2018-01-11 (×9): qty 2

## 2018-01-11 MED ORDER — VANCOMYCIN HCL 1000 MG IV SOLR
INTRAVENOUS | Status: AC
  Filled 2018-01-11: qty 1000

## 2018-01-11 MED ORDER — FOLIC ACID 1 MG PO TABS
2.0000 mg | ORAL_TABLET | Freq: Every day | ORAL | Status: DC
  Administered 2018-01-12 – 2018-01-16 (×5): 2 mg via ORAL
  Filled 2018-01-11 (×6): qty 2

## 2018-01-11 MED ORDER — FENTANYL CITRATE (PF) 100 MCG/2ML IJ SOLN
INTRAMUSCULAR | Status: DC | PRN
  Administered 2018-01-11: 150 ug via INTRAVENOUS
  Administered 2018-01-11: 100 ug via INTRAVENOUS

## 2018-01-11 MED ORDER — FENTANYL CITRATE (PF) 100 MCG/2ML IJ SOLN: INTRAMUSCULAR | Status: DC | PRN

## 2018-01-11 SURGICAL SUPPLY — 40 items
APL SKNCLS STERI-STRIP NONHPOA (GAUZE/BANDAGES/DRESSINGS) ×1
BAG DECANTER FOR FLEXI CONT (MISCELLANEOUS) ×2 IMPLANT
BENZOIN TINCTURE PRP APPL 2/3 (GAUZE/BANDAGES/DRESSINGS) ×2 IMPLANT
CANISTER SUCT 3000ML PPV (MISCELLANEOUS) ×2 IMPLANT
CLSR STERI-STRIP ANTIMIC 1/2X4 (GAUZE/BANDAGES/DRESSINGS) ×4 IMPLANT
DRAPE C-ARM 42X72 X-RAY (DRAPES) ×4 IMPLANT
DRAPE LAPAROTOMY 100X72X124 (DRAPES) ×2 IMPLANT
DRAPE POUCH INSTRU U-SHP 10X18 (DRAPES) ×2 IMPLANT
DRAPE SURG 17X23 STRL (DRAPES) ×2 IMPLANT
DRSG OPSITE POSTOP 3X4 (GAUZE/BANDAGES/DRESSINGS) ×2 IMPLANT
DRSG OPSITE POSTOP 4X6 (GAUZE/BANDAGES/DRESSINGS) ×2 IMPLANT
DURAPREP 26ML APPLICATOR (WOUND CARE) ×2 IMPLANT
ELECT REM PT RETURN 9FT ADLT (ELECTROSURGICAL) ×2
ELECTRODE REM PT RTRN 9FT ADLT (ELECTROSURGICAL) ×1 IMPLANT
GAUZE SPONGE 4X4 16PLY XRAY LF (GAUZE/BANDAGES/DRESSINGS) IMPLANT
GLOVE BIO SURGEON STRL SZ7 (GLOVE) IMPLANT
GLOVE BIO SURGEON STRL SZ8 (GLOVE) ×2 IMPLANT
GLOVE BIOGEL PI IND STRL 7.0 (GLOVE) IMPLANT
GLOVE BIOGEL PI INDICATOR 7.0 (GLOVE)
GOWN STRL REUS W/ TWL LRG LVL3 (GOWN DISPOSABLE) IMPLANT
GOWN STRL REUS W/ TWL XL LVL3 (GOWN DISPOSABLE) ×1 IMPLANT
GOWN STRL REUS W/TWL 2XL LVL3 (GOWN DISPOSABLE) IMPLANT
GOWN STRL REUS W/TWL LRG LVL3 (GOWN DISPOSABLE)
GOWN STRL REUS W/TWL XL LVL3 (GOWN DISPOSABLE) ×2
KIT BASIN OR (CUSTOM PROCEDURE TRAY) ×2 IMPLANT
KIT ROOM TURNOVER OR (KITS) ×2 IMPLANT
NEEDLE HYPO 25X1 1.5 SAFETY (NEEDLE) ×2 IMPLANT
NEEDLE SPNL 20GX3.5 QUINCKE YW (NEEDLE) IMPLANT
NS IRRIG 1000ML POUR BTL (IV SOLUTION) ×2 IMPLANT
PACK LAMINECTOMY NEURO (CUSTOM PROCEDURE TRAY) ×2 IMPLANT
PAD ARMBOARD 7.5X6 YLW CONV (MISCELLANEOUS) ×6 IMPLANT
RUBBERBAND STERILE (MISCELLANEOUS) ×4 IMPLANT
STRIP CLOSURE SKIN 1/2X4 (GAUZE/BANDAGES/DRESSINGS) ×2 IMPLANT
SUT VIC AB 0 CT1 18XCR BRD8 (SUTURE) ×1 IMPLANT
SUT VIC AB 0 CT1 8-18 (SUTURE) ×2
SUT VIC AB 2-0 CP2 18 (SUTURE) ×2 IMPLANT
SUT VIC AB 3-0 SH 8-18 (SUTURE) ×2 IMPLANT
TOWEL GREEN STERILE (TOWEL DISPOSABLE) ×2 IMPLANT
TOWEL GREEN STERILE FF (TOWEL DISPOSABLE) ×2 IMPLANT
WATER STERILE IRR 1000ML POUR (IV SOLUTION) ×2 IMPLANT

## 2018-01-11 NOTE — ED Notes (Signed)
Dressing changed on her back moderate amt. Drainage.

## 2018-01-11 NOTE — ED Notes (Signed)
C/o increased pain and drainage from spinal stimulator site , states she saw her neuro surg. yest and was told if it started draining to come to the ed and have it removed.

## 2018-01-11 NOTE — ED Provider Notes (Signed)
[facility] EMERGENCY DEPARTMENT Provider Note   CSN: [id number] Arrival date & time: 01/11/18  0015     History   Chief Complaint Chief Complaint  Patient presents with  . Post-op Problem    HPI Lauren Winters is a 52 y.o. female.  Patient with a history of lupus, HTN, rheumatoid arthritis presents with concern for post operative infection. She had a spinal cord stimulator placed on 12/13/17 by Dr. Sherley Bounds. The area has become increasingly sore over the last one week associated with intermittent fevers, nausea and generalized body aches. She went to see Dr. Ronnald Ramp yesterday and was started on an antibiotic for wound infection. She states she was given specific instructions to come to the emergency department if there was any drainage from the wound as this would be an indication to have it removed.    The history is provided by the patient. No language interpreter was used.    Past Medical History:  Diagnosis Date  . Anxiety   . Atypical chest pain    a. ETT 05/2014: normal stress test without evidence of myocardial ischemia or chest pain at peak stress  . Complication of anesthesia    migraine headache  . Depression   . Diabetes mellitus without complication ([place]) metformin   dx 2015  . Dyspnea    on exertion  . Fibromyalgia   . Headache   . Heart murmur    told back in 2007, but no mention with 2016 cardiac w/u  . Hypertension   . Lupus (systemic lupus erythematosus) ([place])   . Mitral regurgitation    a. 05/2014 echo: EF >55%, nl LV/RV sys fxn, mild MR/AR, no valvular stenosis   . Obesity (BMI 30-39.9)   . Raynaud's phenomenon   . Rheumatoid arthritis ([place])   . Sjogren's disease ([place])   . Tobacco abuse    a. ongoing; b. ongoing SOB    Patient Active Problem List   Diagnosis Date Noted  . S/P lumbar laminectomy 12/09/2017  . Paresthesia 05/11/2017  . HNP (herniated nucleus pulposus), lumbar 03/29/2017  . Chronic right shoulder pain  02/18/2017  . Primary osteoarthritis of both hands 12/22/2016  . Primary osteoarthritis of both knees 12/22/2016  . Spondylosis of lumbar region without myelopathy or radiculopathy 12/22/2016  . Fibromyalgia 12/22/2016  . Recurrent oral ulcers 12/22/2016  . Preventative health care 04/19/2016  . Chronic back pain 12/17/2015  . Hyperlipidemia 08/29/2015  . Lupus (systemic lupus erythematosus) ([place]) 08/04/2015  . Rheumatoid factor positive 08/04/2015  . Sjogren's syndrome ([place]) 08/04/2015  . Mitral regurgitation   . Tobacco abuse   . Atypical chest pain   . Obesity (BMI 30-39.9)   . Anxiety   . Hypertension   . Diabetes mellitus without complication ([place])   . Clinical depression 05/23/2014    Past Surgical History:  Procedure Laterality Date  . ABDOMINAL HYSTERECTOMY    . BACK SURGERY    . CESAREAN SECTION     x3  . LUMBAR LAMINECTOMY    . LUMBAR LAMINECTOMY/DECOMPRESSION MICRODISCECTOMY N/A 03/29/2017   Procedure: Re operative Right Lumbar Three-Four Laminectomy and discectomy with Left Lumbar Three-Four, Bilateral Lumbar Four-Five Laminectomy for decompression;  Surgeon: Kevan Ny Ditty, MD;  Location: [facility];  Service: Neurosurgery;  Laterality: N/A;  . SPINAL CORD STIMULATOR INSERTION  12/09/2017   Procedure: LUMBAR SPINAL CORD STIMULATOR IMPLANT;  Surgeon: Eustace Moore, MD;  Location: [facility];  Service: Neurosurgery;;  . TUBAL LIGATION  OB History    No data available       Home Medications    Prior to Admission medications   Medication Sig Start Date End Date Taking? Authorizing Provider  amLODipine (NORVASC) 5 MG tablet Take 1 tablet (5 mg total) by mouth 2 (two) times daily. 12/25/15  Yes Pleas Koch, NP  buPROPion (WELLBUTRIN XL) 150 MG 24 hr tablet Take 1 tablet (150 mg total) by mouth daily. 09/28/16  Yes Pleas Koch, NP  clobetasol cream (TEMOVATE) 7.10 % Apply 1 application topically 2 (two) times daily as needed (for lupis rash).   Yes  [provider]  cloNIDine (CATAPRES) 0.1 MG tablet Take 1 tablet (0.1 mg total) by mouth 2 (two) times daily. 05/05/17  Yes Pleas Koch, NP  cyclobenzaprine (FLEXERIL) 10 MG tablet Take 1 tablet (10 mg total) by mouth 3 (three) times daily as needed for muscle spasms. 12/13/17  Yes Eustace Moore, MD  diclofenac (VOLTAREN) 75 MG EC tablet Take 75 mg by mouth 3 (three) times daily.  06/23/17  Yes [provider]  escitalopram (LEXAPRO) 20 MG tablet Take 20 mg by mouth daily.   Yes [provider]  folic acid (FOLVITE) 1 MG tablet Take 2 tablets (2 mg total) by mouth daily. 04/20/17 07/14/18 Yes Panwala, Naitik, PA-C  gabapentin (NEURONTIN) 300 MG capsule Take 600 mg by mouth 3 (three) times daily.  11/02/16  Yes [provider]  glipiZIDE (GLUCOTROL) 10 MG tablet Take 1 tablet (10 mg total) 2 (two) times daily before a meal by mouth. 11/09/17  Yes Pleas Koch, NP  Halcinonide 0.1 % OINT Apply 1 application topically 2 (two) times daily as needed (for lupis rash).    Yes [provider]  HYDROcodone-acetaminophen (NORCO) 7.5-325 MG tablet Take 1 tablet by mouth every 4 (four) hours as needed for moderate pain. 12/13/17  Yes Eustace Moore, MD  hydroxychloroquine (PLAQUENIL) 200 MG tablet Take 1 tablet (200 mg total) by mouth 2 (two) times daily. 08/11/17  Yes Deveshwar, Abel Presto, MD  hydrOXYzine (ATARAX/VISTARIL) 25 MG tablet Take 1 tablet (25 mg total) by mouth 2 (two) times daily as needed for anxiety. Patient taking differently: Take 25 mg by mouth 2 (two) times daily.  07/28/16  Yes Pleas Koch, NP  metFORMIN (GLUCOPHAGE) 1000 MG tablet Take 1 tablet (1,000 mg total) by mouth 2 (two) times daily with a meal. 03/17/16  Yes Pleas Koch, NP  methotrexate 50 MG/2ML injection Inject 20 mg into the muscle every Tuesday.   Yes [provider]  metoprolol (LOPRESSOR) 100 MG tablet Take 1 tablet (100 mg total) by mouth 2 (two) times  daily. 12/17/15  Yes Pleas Koch, NP  pilocarpine (SALAGEN) 5 MG tablet Take 5 mg by mouth daily as needed (for dry mouth).    Yes [provider]  sulfamethoxazole-trimethoprim (BACTRIM DS,SEPTRA DS) 800-160 MG tablet Take 1 tablet by mouth 2 (two) times daily. 01/10/18  Yes [provider]  tacrolimus (PROTOPIC) 0.1 % ointment Apply 1 application topically 2 (two) times daily as needed (for lupis rash).    Yes [provider]  Triamcinolone Acetonide (TRIAMCINOLONE 0.1 % CREAM : EUCERIN) CREA Apply 1 application topically 2 (two) times daily as needed for rash or irritation.    Yes [provider]  valsartan-hydrochlorothiazide (DIOVAN-HCT) 320-25 MG tablet TAKE 1 TABLET BY MOUTH DAILY. 10/06/17  Yes Pleas Koch, NP  magic mouthwash w/lidocaine SOLN diphenhydramine/ 2%  VISCOUS lidocaine /antacid-MAALOX (1:1:1); 15 mL swished in the mouth for 30 sec, then spit out.    May be used every 3 to 4 h Patient not taking: Reported on 12/02/2017 04/20/17   Eliezer Lofts, PA-C  Vitamin D, Ergocalciferol, (DRISDOL) 50000 units CAPS capsule Take 1 capsule (50,000 Units total) by mouth 2 (two) times a week. Patient not taking: Reported on 12/02/2017 07/21/17   Eliezer Lofts, PA-C    Family History Family History  Problem Relation Age of Onset  . Hypertension Mother   . Valvular heart disease Mother   . CAD Father 69       CABG x 4  . Liver cancer Father 42       passed  . CAD Paternal Aunt        CABG  . Breast cancer Paternal Aunt   . CAD Paternal Aunt        CABG  . Breast cancer Paternal Aunt   . CAD Paternal Aunt        CABG  . Breast cancer Paternal Aunt   . CAD Paternal Aunt        CABG  . Breast cancer Paternal Aunt   . CAD Paternal Aunt        CABG  . Breast cancer Paternal Aunt   . CAD Paternal Uncle        MI s/p CABG  . Stroke Maternal Grandmother   . Stroke Maternal Grandfather   . CAD Maternal Grandfather     Social  History Social History   Tobacco Use  . Smoking status: Former Smoker    Packs/day: 0.50    Years: 10.00    Pack years: 5.00    Types: Cigarettes    Last attempt to quit: 07/22/2015    Years since quitting: 2.4  . Smokeless tobacco: Never Used  Substance Use Topics  . Alcohol use: No    Alcohol/week: 0.0 oz  . Drug use: No     Allergies   Pseudoephedrine hcl; Clindamycin/lincomycin; Prednisone; Adhesive [tape]; Other; and Sudafed [pseudoephedrine hcl]   Review of Systems Review of Systems  Constitutional: Positive for chills and fever.  Respiratory: Negative.  Negative for shortness of breath.   Cardiovascular: Negative.  Negative for chest pain.  Gastrointestinal: Negative.  Negative for abdominal pain.  Musculoskeletal: Positive for back pain (See HPI.).  Skin: Negative.        Incisional pain, redness and drainage.  Neurological: Negative.      Physical Exam Updated Vital Signs BP (!) 146/75   Pulse 97   Temp 97.8 F (36.6 C) (Oral)   Resp 20   SpO2 98%   Physical Exam  Constitutional: She is oriented to person, place, and time. She appears well-developed and well-nourished.  Neck: Normal range of motion.  Cardiovascular: Normal rate and regular rhythm.  Pulmonary/Chest: Effort normal. She has no wheezes. She has no rales.  Abdominal: Soft. There is no tenderness.  Musculoskeletal:  Surgical incision right lower lateral back has surrounding erythema and active mucopurulent drainage from the incision. Very tender. Overlying bandage is saturated with drainage. Nonbloody.  Neurological: She is alert and oriented to person, place, and time.  Skin: Skin is warm and dry.     ED Treatments / Results  Labs (all labs ordered are listed, but only abnormal results are displayed) Labs Reviewed  COMPREHENSIVE METABOLIC PANEL - Abnormal; Notable for the following components:      Result Value   Chloride 100 (*)  Glucose, Bld 207 (*)    ALT 13 (*)    All other  components within normal limits  CBC WITH DIFFERENTIAL/PLATELET - Abnormal; Notable for the following components:   WBC 10.9 (*)    Neutro Abs 7.9 (*)    All other components within normal limits  URINALYSIS, ROUTINE W REFLEX MICROSCOPIC - Abnormal; Notable for the following components:   APPearance HAZY (*)    Specific Gravity, Urine 1.031 (*)    Glucose, UA 150 (*)    Protein, ur 30 (*)    Leukocytes, UA TRACE (*)    Bacteria, UA RARE (*)    Squamous Epithelial / LPF 0-5 (*)    All other components within normal limits  CULTURE, BLOOD (ROUTINE X 2)  CULTURE, BLOOD (ROUTINE X 2)  PROTIME-INR  I-STAT CG4 LACTIC ACID, ED  I-STAT BETA HCG BLOOD, ED ([facility], [facility], [facility] ONLY)  I-STAT TROPONIN, ED  I-STAT CG4 LACTIC ACID, ED    EKG  EKG Interpretation  Date/Time:  Wednesday January 11 2018 00:31:57 EST Ventricular Rate:  99 PR Interval:  154 QRS Duration: 76 QT Interval:  346 QTC Calculation: 444 R Axis:   22 Text Interpretation:  Normal sinus rhythm Cannot rule out Anterior infarct , age undetermined Abnormal ECG When compared with ECG of 03/21/2017, No significant change was found Confirmed by Delora Fuel ([id number]) on 01/11/2018 1:58:13 AM       Radiology Dg Chest 2 View  Result Date: 01/11/2018 CLINICAL DATA:  52 year old female with chest pain. EXAM: CHEST  2 VIEW COMPARISON:  Chest radiograph dated 12/06/2017 FINDINGS: The lungs are clear. There is no pleural effusion or pneumothorax. The cardiac silhouette is within normal limits. Spinal stimulator noted at the level of midthoracic spine. No acute osseous pathology. IMPRESSION: No active cardiopulmonary disease. Electronically Signed   By: Anner Crete M.D.   On: 01/11/2018 01:14    Procedures Procedures (including critical care time)  Medications Ordered in ED Medications - No data to display   Initial Impression / Assessment and Plan / ED Course  I have reviewed the triage vital signs and the nursing  notes.  Pertinent labs & imaging results that were available during my care of the patient were reviewed by me and considered in my medical decision making (see chart for details).     The patient presents with redness and drainage from the incision site where SCS was placed 12/13/18 by Dr. Ronnald Ramp. She reports following Dr. Ronnald Ramp' instructions to come to the emergency department for treatment if there is any drainage from the wound.   No evidence of sepsis. No leukocytosis or fever in the ED. Discussed with Dr. Annette Stable who advises patient can be seen in the office later today by Dr. Ronnald Ramp and have stimulator removed in-office. The patient and family are not comfortable with discharge home with outpatient follow up. She feels that being immunocompromised that she would worsen through the day despite reassurance. She would like to stay in the ED until appointment can be scheduled for her to leave here and go directly to Dr. Ronnald Ramp' office. Will accommodate her request. Pain medication provided for comfort. Patient also provided the next dose of her Bactrim.   7:10 - Called Dr. Ronnald Ramp to facilitate patient's being seen this morning in the office and was advised he will see the patient in the ED and anticipates going to the operating room to remove the SCS. Patient kept NPO at this time. Last snack was 2:30 am.  Had PO meds at 6:30 am, nothing afterward.   Patient admitted to Dr. Ronnald Ramp' service.    Final Clinical Impressions(s) / ED Diagnoses   Final diagnoses:  None   1. Post-operative infection  ED Discharge Orders    None       Charlann Lange, Hershal Coria 52/17/47 [id number]    Delora Fuel, MD 39/67/28 [id number]

## 2018-01-11 NOTE — ED Triage Notes (Signed)
Pt states that she has a spinal stimulator placed four weeks ago, seen neurosurgeon today and placed on ABT, told is it starts to drain to come to the ED. Wound started leaking about 30 minutes ago, yellowish brown drainage. Pt also states she has been having CP since Thursday, radiates to L side and back.

## 2018-01-11 NOTE — Transfer of Care (Signed)
Immediate Anesthesia Transfer of Care Note  Patient: Lauren Winters  Procedure(s) Performed: LUMBAR SPINAL CORD STIMULATOR REMOVAL (N/A Back)  Patient Location: PACU  Anesthesia Type:General  Level of Consciousness: awake and alert   Airway & Oxygen Therapy: Patient Spontanous Breathing and Patient connected to nasal cannula oxygen  Post-op Assessment: Report given to RN and Post -op Vital signs reviewed and stable  Post vital signs: Reviewed and stable  Last Vitals:  Vitals:   01/11/18 1800 01/11/18 2007  BP: (!) 100/59   Pulse: 76   Resp: 16   Temp:  (!) (P) 36.2 C  SpO2: 100%     Last Pain:  Vitals:   01/11/18 2007  TempSrc:   PainSc: (P) Asleep         Complications: No apparent anesthesia complications

## 2018-01-11 NOTE — Op Note (Signed)
01/11/2018  8:02 PM  PATIENT:  Lauren Winters  52 y.o. female  PRE-OPERATIVE DIAGNOSIS:  Infected spinal cord stimulator  POST-OPERATIVE DIAGNOSIS:  same  PROCEDURE:  Irrigation and debridement of wounds and removal of pulse generator and paddle lead of spinal cord stimulator  SURGEON:  Sherley Bounds, MD  ASSISTANTS: None  ANESTHESIA:   General  EBL: Minimal ml  Total I/O In: 1600 [I.V.:1600] Out: -   BLOOD ADMINISTERED: none  DRAINS: None  SPECIMEN:  none  INDICATION FOR PROCEDURE: This patient presented with drainage from her wound over her pulse generator. Drainage looked purulent. Pain was debilitating. Recommended irrigation and debridement of wounds and removal of pulse generator and paddle lead. Patient understood the risks, benefits, and alternatives and potential outcomes and wished to proceed.  PROCEDURE DETAILS: The patient was taken to the operating room and after induction of adequate generalized endotracheal anesthesia, the patient was rolled into the prone position on the Wilson frame and all pressure points were padded. The thoracic and lumbar region was cleaned and then prepped with DuraPrep and draped in the usual sterile fashion. 5 cc of local anesthesia was injected and then a dorsal midline incision was made over the pulse generator through the old incision and carried down to the thoracic fascia. The fascia was opened and the paraspinous musculature was opened to identify the stitch holding the leads in place. This was cut and removed. The incision over the pulse generator was also opened and there was immediate release of purulent fluid. This was cultured. The pulse generator was removed and the leads were cut. I then pulled the leads to the thoracic incision and completely remove the paddle and the leads. . I irrigated with saline solution containing bacitracin. Achieved hemostasis with bipolar cautery, placed powdered vancomycin and both wounds, and then closed  the fascia with 0 Vicryl. I closed the subcutaneous tissues with 2-0 Vicryl and the subcuticular tissues with 3-0 Vicryl. The skin was then closed with benzoin and Steri-Strips. The drapes were removed, a sterile dressing was applied to each wound. The patient was awakened from general anesthesia and transferred to the recovery room in stable condition. At the end of the procedure all sponge, needle and instrument counts were correct.    PLAN OF CARE: Admit to inpatient   PATIENT DISPOSITION:  PACU - hemodynamically stable.   Delay start of Pharmacological VTE agent (>24hrs) due to surgical blood loss or risk of bleeding:  yes

## 2018-01-11 NOTE — H&P (Signed)
Subjective: Patient is a 52 y.o. female admitted for infected spinal cord stimulator. Onset of symptoms was 5 days ago, gradually worsening since that time.  The pain is rated severe, and is located at the across the lower back. The pain is described as aching and occurs all day. The symptoms have been progressive. Symptoms are exacerbated by nothing in particular. She was seen in the office yesterday with some redness around her flank incision and started on oral antibiotics. She was told to go immediately to the emergency department should she develop any opening or drainage of the wound. She started to drain in the middle of the night and went to the emergency department where she was evaluated. She is nontoxic with no fevers or chills   Past Medical History:  Diagnosis Date  . Anxiety   . Atypical chest pain    a. ETT 05/2014: normal stress test without evidence of myocardial ischemia or chest pain at peak stress  . Complication of anesthesia    migraine headache  . Depression   . Diabetes mellitus without complication ([place]) metformin   dx 2015  . Dyspnea    on exertion  . Fibromyalgia   . Headache   . Heart murmur    told back in 2007, but no mention with 2016 cardiac w/u  . Hypertension   . Lupus (systemic lupus erythematosus) ([place])   . Mitral regurgitation    a. 05/2014 echo: EF >55%, nl LV/RV sys fxn, mild MR/AR, no valvular stenosis   . Obesity (BMI 30-39.9)   . Raynaud's phenomenon   . Rheumatoid arthritis ([place])   . Sjogren's disease ([place])   . Tobacco abuse    a. ongoing; b. ongoing SOB    Past Surgical History:  Procedure Laterality Date  . ABDOMINAL HYSTERECTOMY    . BACK SURGERY    . CESAREAN SECTION     x3  . LUMBAR LAMINECTOMY    . LUMBAR LAMINECTOMY/DECOMPRESSION MICRODISCECTOMY N/A 03/29/2017   Procedure: Re operative Right Lumbar Three-Four Laminectomy and discectomy with Left Lumbar Three-Four, Bilateral Lumbar Four-Five Laminectomy for decompression;  Surgeon:  Kevan Ny Ditty, MD;  Location: [facility];  Service: Neurosurgery;  Laterality: N/A;  . SPINAL CORD STIMULATOR INSERTION  12/09/2017   Procedure: LUMBAR SPINAL CORD STIMULATOR IMPLANT;  Surgeon: Eustace Moore, MD;  Location: [facility];  Service: Neurosurgery;;  . TUBAL LIGATION      Prior to Admission medications   Medication Sig Start Date End Date Taking? Authorizing Provider  amLODipine (NORVASC) 5 MG tablet Take 1 tablet (5 mg total) by mouth 2 (two) times daily. 12/25/15  Yes Pleas Koch, NP  buPROPion (WELLBUTRIN XL) 150 MG 24 hr tablet Take 1 tablet (150 mg total) by mouth daily. 09/28/16  Yes Pleas Koch, NP  clobetasol cream (TEMOVATE) 9.92 % Apply 1 application topically 2 (two) times daily as needed (for lupis rash).   Yes [provider]  cloNIDine (CATAPRES) 0.1 MG tablet Take 1 tablet (0.1 mg total) by mouth 2 (two) times daily. 05/05/17  Yes Pleas Koch, NP  cyclobenzaprine (FLEXERIL) 10 MG tablet Take 1 tablet (10 mg total) by mouth 3 (three) times daily as needed for muscle spasms. 12/13/17  Yes Eustace Moore, MD  diclofenac (VOLTAREN) 75 MG EC tablet Take 75 mg by mouth 3 (three) times daily.  06/23/17  Yes [provider]  escitalopram (LEXAPRO) 20 MG tablet Take 20 mg by mouth daily.   Yes [provider]  folic acid (FOLVITE) 1 MG tablet Take 2 tablets (2 mg total) by mouth daily. 04/20/17 07/14/18 Yes Panwala, Naitik, PA-C  gabapentin (NEURONTIN) 300 MG capsule Take 600 mg by mouth 3 (three) times daily.  11/02/16  Yes [provider]  glipiZIDE (GLUCOTROL) 10 MG tablet Take 1 tablet (10 mg total) 2 (two) times daily before a meal by mouth. 11/09/17  Yes Pleas Koch, NP  Halcinonide 0.1 % OINT Apply 1 application topically 2 (two) times daily as needed (for lupis rash).    Yes [provider]  HYDROcodone-acetaminophen (NORCO) 7.5-325 MG tablet Take 1 tablet by mouth every 4 (four) hours as needed for moderate  pain. 12/13/17  Yes Eustace Moore, MD  hydroxychloroquine (PLAQUENIL) 200 MG tablet Take 1 tablet (200 mg total) by mouth 2 (two) times daily. 08/11/17  Yes Deveshwar, Abel Presto, MD  hydrOXYzine (ATARAX/VISTARIL) 25 MG tablet Take 1 tablet (25 mg total) by mouth 2 (two) times daily as needed for anxiety. Patient taking differently: Take 25 mg by mouth 2 (two) times daily.  07/28/16  Yes Pleas Koch, NP  metFORMIN (GLUCOPHAGE) 1000 MG tablet Take 1 tablet (1,000 mg total) by mouth 2 (two) times daily with a meal. 03/17/16  Yes Pleas Koch, NP  methotrexate 50 MG/2ML injection Inject 20 mg into the muscle every Tuesday.   Yes [provider]  metoprolol (LOPRESSOR) 100 MG tablet Take 1 tablet (100 mg total) by mouth 2 (two) times daily. 12/17/15  Yes Pleas Koch, NP  pilocarpine (SALAGEN) 5 MG tablet Take 5 mg by mouth daily as needed (for dry mouth).    Yes [provider]  sulfamethoxazole-trimethoprim (BACTRIM DS,SEPTRA DS) 800-160 MG tablet Take 1 tablet by mouth 2 (two) times daily. 01/10/18  Yes [provider]  tacrolimus (PROTOPIC) 0.1 % ointment Apply 1 application topically 2 (two) times daily as needed (for lupis rash).    Yes [provider]  Triamcinolone Acetonide (TRIAMCINOLONE 0.1 % CREAM : EUCERIN) CREA Apply 1 application topically 2 (two) times daily as needed for rash or irritation.    Yes [provider]  valsartan-hydrochlorothiazide (DIOVAN-HCT) 320-25 MG tablet TAKE 1 TABLET BY MOUTH DAILY. 10/06/17  Yes Pleas Koch, NP  magic mouthwash w/lidocaine SOLN diphenhydramine/ 2%  VISCOUS lidocaine /antacid-MAALOX (1:1:1); 15 mL swished in the mouth for 30 sec, then spit out.    May be used every 3 to 4 h Patient not taking: Reported on 12/02/2017 04/20/17   Eliezer Lofts, PA-C  Vitamin D, Ergocalciferol, (DRISDOL) 50000 units CAPS capsule Take 1 capsule (50,000 Units total) by mouth 2 (two) times a week. Patient not  taking: Reported on 12/02/2017 07/21/17   Eliezer Lofts, PA-C   Allergies  Allergen Reactions  . Pseudoephedrine Hcl Shortness Of Breath and Other (See Comments)    Room starts spinning, can't breath  . Clindamycin/Lincomycin Hives  . Prednisone Other (See Comments)    Chest pain w/ cortisone shots or prednisone pills Elevated blood pressure  . Adhesive [Tape] Rash and Other (See Comments)    Sensitivity to certain band aids and adhesives - burns skin, makes skin raw  . Other Rash and Other (See Comments)    Sensitivity to certain band aids and adhesives - burns skin, makes skin raw  . Sudafed [Pseudoephedrine Hcl] Other (See Comments)    Dizziness     Social History   Tobacco Use  . Smoking status: Former Smoker    Packs/day: 0.50  Years: 10.00    Pack years: 5.00    Types: Cigarettes    Last attempt to quit: 07/22/2015    Years since quitting: 2.4  . Smokeless tobacco: Never Used  Substance Use Topics  . Alcohol use: No    Alcohol/week: 0.0 oz    Family History  Problem Relation Age of Onset  . Hypertension Mother   . Valvular heart disease Mother   . CAD Father 14       CABG x 4  . Liver cancer Father 15       passed  . CAD Paternal Aunt        CABG  . Breast cancer Paternal Aunt   . CAD Paternal Aunt        CABG  . Breast cancer Paternal Aunt   . CAD Paternal Aunt        CABG  . Breast cancer Paternal Aunt   . CAD Paternal Aunt        CABG  . Breast cancer Paternal Aunt   . CAD Paternal Aunt        CABG  . Breast cancer Paternal Aunt   . CAD Paternal Uncle        MI s/p CABG  . Stroke Maternal Grandmother   . Stroke Maternal Grandfather   . CAD Maternal Grandfather      Review of Systems  Positive ROS: As above  All other systems have been reviewed and were otherwise negative with the exception of those mentioned in the HPI and as above.  Objective: Vital signs in last 24 hours: Temp:  [97.7 F (36.5 C)-97.8 F (36.6 C)] 97.8 F (36.6 C)  (01/16 0349) Pulse Rate:  [70-103] 84 (01/16 0745) Resp:  [16-20] 16 (01/16 0634) BP: (128-170)/(64-95) 150/67 (01/16 0745) SpO2:  [98 %-100 %] 100 % (01/16 0745)  General Appearance: Alert, cooperative, no distress, appears stated age Head: Normocephalic, without obvious abnormality, atraumatic Eyes: PERRL, conjunctiva/corneas clear, EOM's intact    Neck: Supple, symmetrical, trachea midline Back: Symmetric, no curvature, ROM normal, no CVA tenderness Lungs:  respirations unlabored Heart: Regular rate and rhythm Abdomen: Soft, non-tender Extremities: Extremities normal, atraumatic, no cyanosis or edema Pulses: 2+ and symmetric all extremities Skin: Left flank incision red tender and opened with drainage of purulent fluid   NEUROLOGIC:   Mental status: Alert and oriented x4,  no aphasia, good attention span, fund of knowledge, and memory Motor Exam - grossly normal Sensory Exam - grossly normal Reflexes: Normal Coordination - grossly normal Gait - grossly normal Balance - grossly normal Cranial Nerves: I: smell Not tested  II: visual acuity  OS: nl    OD: nl  II: visual fields Full to confrontation  II: pupils Equal, round, reactive to light  III,VII: ptosis None  III,IV,VI: extraocular muscles  Full ROM  V: mastication Normal  V: facial light touch sensation  Normal  V,VII: corneal reflex  Present  VII: facial muscle function - upper  Normal  VII: facial muscle function - lower Normal  VIII: hearing Not tested  IX: soft palate elevation  Normal  IX,X: gag reflex Present  XI: trapezius strength  5/5  XI: sternocleidomastoid strength 5/5  XI: neck flexion strength  5/5  XII: tongue strength  Normal    Data Review Lab Results  Component Value Date   WBC 10.9 (H) 01/11/2018   HGB 12.3 01/11/2018   HCT 37.1 01/11/2018   MCV 83.0 01/11/2018   PLT 355 01/11/2018  Lab Results  Component Value Date   NA 137 01/11/2018   K 3.8 01/11/2018   CL 100 (L) 01/11/2018    CO2 25 01/11/2018   BUN 15 01/11/2018   CREATININE 0.80 01/11/2018   GLUCOSE 207 (H) 01/11/2018   Lab Results  Component Value Date   INR 1.00 01/11/2018    Assessment/Plan: Patient admitted for removal of infected spinal cord stimulator with irrigation and debridement of wounds. Patient has failed a reasonable attempt at conservative therapy. She understands she will be admitted for several days to await cultures, placement of PICC line, and use of IV antibiotics  I explained the condition and procedure to the patient and answered any questions.  Patient wishes to proceed with procedure as planned. Understands risks/ benefits and typical outcomes of procedure.   Theadore Blunck S 01/11/2018 9:34 AM

## 2018-01-11 NOTE — ED Provider Notes (Signed)
  RN informs me patient is complaining of 8/10 pain.  She is reportedly scheduled for surgery later today to have a spinal stimulator removed. To keep patient n.p.o., IV morphine was ordered.  Medications  morphine 4 MG/ML injection 4 mg (not administered)  ondansetron (ZOFRAN) injection 4 mg (not administered)  0.9 %  sodium chloride infusion (not administered)  sulfamethoxazole-trimethoprim (BACTRIM DS,SEPTRA DS) 800-160 MG per tablet 1 tablet (1 tablet Oral Given 01/11/18 0459)  oxyCODONE-acetaminophen (PERCOCET/ROXICET) 5-325 MG per tablet 1 tablet (1 tablet Oral Given 01/11/18 9774)    EKG Interpretation  Date/Time:  Wednesday January 11 2018 00:31:57 EST Ventricular Rate:  99 PR Interval:  154 QRS Duration: 76 QT Interval:  346 QTC Calculation: 444 R Axis:   22 Text Interpretation:  Normal sinus rhythm Cannot rule out Anterior infarct , age undetermined Abnormal ECG When compared with ECG of 03/21/2017, No significant change was found Confirmed by Delora Fuel ([id number]) on 01/11/2018 1:58:13 AM       Vitals:   01/11/18 0700 01/11/18 0715 01/11/18 0730 01/11/18 0745  BP: (!) 144/67 (!) 144/65 (!) 146/95 (!) 150/67  Pulse: 73 70 86 84  Resp:      Temp:      TempSrc:      SpO2: 99% 98% 100% 100%      Lorayne Bender, PA-C 01/11/18 1005    Lacretia Leigh, MD 01/12/18 [phone]

## 2018-01-11 NOTE — Anesthesia Preprocedure Evaluation (Addendum)
Anesthesia Evaluation  Patient identified by MRN, date of birth, ID band Patient awake    Reviewed: Allergy & Precautions, H&P , NPO status , Patient's Chart, lab work & pertinent test results, reviewed documented beta blocker date and time   Airway Mallampati: II  TM Distance: >3 FB Neck ROM: full    Dental no notable dental hx.    Pulmonary former smoker,    Pulmonary exam normal breath sounds clear to auscultation       Cardiovascular hypertension, Pt. on medications and Pt. on home beta blockers  Rhythm:regular Rate:Normal     Neuro/Psych    GI/Hepatic   Endo/Other  diabetes, Oral Hypoglycemic Agents  Renal/GU      Musculoskeletal  (+) Fibromyalgia -Lupus (systemic lupus erythematosus)  Raynaud's phenomenon Sjogren's disease   Abdominal (+) + obese,   Peds  Hematology   Anesthesia Other Findings Infected Spinal Cord Stimulator  Reproductive/Obstetrics                            Lab Results  Component Value Date   WBC 10.9 (H) 01/11/2018   HGB 12.3 01/11/2018   HCT 37.1 01/11/2018   MCV 83.0 01/11/2018   PLT 355 01/11/2018   Lab Results  Component Value Date   CREATININE 0.80 01/11/2018   BUN 15 01/11/2018   NA 137 01/11/2018   K 3.8 01/11/2018   CL 100 (L) 01/11/2018   CO2 25 01/11/2018    Anesthesia Physical  Anesthesia Plan  ASA: II and emergent  Anesthesia Plan: General   Post-op Pain Management:    Induction: Intravenous  PONV Risk Score and Plan: 3 and Dexamethasone, Ondansetron, Treatment may vary due to age or medical condition and Midazolam  Airway Management Planned: Oral ETT  Additional Equipment:   Intra-op Plan:   Post-operative Plan: Extubation in OR  Informed Consent: I have reviewed the patients History and Physical, chart, labs and discussed the procedure including the risks, benefits and alternatives for the proposed anesthesia with the  patient or authorized representative who has indicated his/her understanding and acceptance.   Dental Advisory Given  Plan Discussed with: CRNA  Anesthesia Plan Comments: (  )       Anesthesia Quick Evaluation

## 2018-01-11 NOTE — Anesthesia Procedure Notes (Signed)
Procedure Name: Intubation Date/Time: 01/11/2018 7:15 PM Performed by: Eligha Bridegroom, CRNA Pre-anesthesia Checklist: Patient identified, Suction available, Patient being monitored and Timeout performed Oxygen Delivery Method: Circle system utilized Preoxygenation: Pre-oxygenation with 100% oxygen Induction Type: IV induction Ventilation: Mask ventilation without difficulty Laryngoscope Size: Mac and 4 Grade View: Grade I Tube type: Oral Tube size: 7.0 mm Number of attempts: 1 Airway Equipment and Method: Stylet

## 2018-01-11 NOTE — ED Notes (Signed)
Pt is ambulatory to restroom with cane.  Steady with cane.

## 2018-01-11 NOTE — ED Notes (Signed)
Pt left ED at this time and roomed to 5C-16

## 2018-01-11 NOTE — Progress Notes (Signed)
Pharmacy Antibiotic Note  Lauren Winters is a 52 y.o. female admitted on 01/11/2018 with wound infection.  Pharmacy has been consulted for Vancomycin / Ceftriaxone  dosing.  S/p I + D of wounds and removal of pulse generator and spinal corder stimulator Received doses this evening  Plan: Ceftriaxone 2 grams iv Q 24 hours Vancomycin 1250 mg iv Q 12 hours Follow up LOT, Scr, cultures  Height: 5\' 6"  (167.6 cm) Weight: 223 lb (101.2 kg) IBW/kg (Calculated) : 59.3  Temp (24hrs), Avg:97.9 F (36.6 C), Min:97.2 F (36.2 C), Max:99 F (37.2 C)  Recent Labs  Lab 01/11/18 0034 01/11/18 0103 01/11/18 0428  WBC 10.9*  --   --   CREATININE 0.80  --   --   LATICACIDVEN  --  1.31 0.93    Estimated Creatinine Clearance: 97.7 mL/min (by C-G formula based on SCr of 0.8 mg/dL).    Allergies  Allergen Reactions  . Pseudoephedrine Hcl Shortness Of Breath and Other (See Comments)    Room starts spinning, can't breath  . Clindamycin/Lincomycin Hives  . Prednisone Other (See Comments)    Chest pain w/ cortisone shots or prednisone pills Elevated blood pressure  . Adhesive [Tape] Rash and Other (See Comments)    Sensitivity to certain band aids and adhesives - burns skin, makes skin raw  . Other Rash and Other (See Comments)    Sensitivity to certain band aids and adhesives - burns skin, makes skin raw  . Sudafed [Pseudoephedrine Hcl] Other (See Comments)    Dizziness       Thank you for allowing pharmacy to be a part of this patient's care. Anette Guarneri, PharmD [phone]  01/11/2018 10:36 PM

## 2018-01-12 ENCOUNTER — Encounter (HOSPITAL_COMMUNITY): Payer: Self-pay | Admitting: Neurological Surgery

## 2018-01-12 ENCOUNTER — Ambulatory Visit: Payer: Self-pay | Admitting: Psychology

## 2018-01-12 LAB — GLUCOSE, CAPILLARY
GLUCOSE-CAPILLARY: 153 mg/dL — AB (ref 65–99)
GLUCOSE-CAPILLARY: 134 mg/dL — AB (ref 65–99)
Glucose-Capillary: 111 mg/dL — ABNORMAL HIGH (ref 65–99)
Glucose-Capillary: 79 mg/dL (ref 65–99)

## 2018-01-12 NOTE — Progress Notes (Signed)
Arrived from PACU in stable condition. Telephone report received prior to arrival. Family present at bedside. Oriented to unit and staff with understanding verbalized. Orders released/reviewed. VSS. Will continue to monitor.

## 2018-01-12 NOTE — Progress Notes (Addendum)
Patient blood pressure running low rechecked manually, patient is asymptomatic with low blood pressure will continue to monitor. MD on call made aware no new orders given.  Kennie Karapetian, Tivis Ringer, RN

## 2018-01-12 NOTE — Anesthesia Postprocedure Evaluation (Signed)
Anesthesia Post Note  Patient: Nayelis T Mizzell  Procedure(s) Performed: LUMBAR SPINAL CORD STIMULATOR REMOVAL (N/A Back)     Patient location during evaluation: PACU Anesthesia Type: General Level of consciousness: awake and alert Pain management: pain level controlled Vital Signs Assessment: post-procedure vital signs reviewed and stable Respiratory status: spontaneous breathing, nonlabored ventilation, respiratory function stable and patient connected to nasal cannula oxygen Cardiovascular status: blood pressure returned to baseline and stable Postop Assessment: no apparent nausea or vomiting Anesthetic complications: no    Last Vitals:  Vitals:   01/12/18 0500 01/12/18 0945  BP: 108/70 (!) 102/57  Pulse: 62 79  Resp: 18 15  Temp: 37.2 C 37.6 C  SpO2: 99% 95%    Last Pain:  Vitals:   01/12/18 0945  TempSrc: Axillary  PainSc:                  Tiajuana Amass

## 2018-01-12 NOTE — Progress Notes (Signed)
NEUROSURGERY PROGRESS NOTE  Doing well. Complains of appropriate back soreness. No new pain No numbness, tingling or weakness Ambulating and voiding well Good strength and sensation Incision CDI  Temp:  [97.2 F (36.2 C)-99 F (37.2 C)] 99 F (37.2 C) (01/17 0500) Pulse Rate:  [62-98] 62 (01/17 0500) Resp:  [9-22] 18 (01/17 0500) BP: (100-157)/(47-115) 108/70 (01/17 0500) SpO2:  [88 %-100 %] 99 % (01/17 0500) Weight:  [101.2 kg (223 lb)] 101.2 kg (223 lb) (01/16 1200)  Plan: Doing well, appropriate back soreness. No drainage from site. Ambulate and pain control today.   Eleonore Chiquito, NP 01/12/2018 7:36 AM

## 2018-01-13 LAB — GLUCOSE, CAPILLARY
GLUCOSE-CAPILLARY: 62 mg/dL — AB (ref 65–99)
GLUCOSE-CAPILLARY: 71 mg/dL (ref 65–99)
Glucose-Capillary: 64 mg/dL — ABNORMAL LOW (ref 65–99)
Glucose-Capillary: 182 mg/dL — ABNORMAL HIGH (ref 65–99)
Glucose-Capillary: 172 mg/dL — ABNORMAL HIGH (ref 65–99)
Glucose-Capillary: 96 mg/dL (ref 65–99)
Glucose-Capillary: 134 mg/dL — ABNORMAL HIGH (ref 65–99)

## 2018-01-13 LAB — VANCOMYCIN, TROUGH: VANCOMYCIN TR: 26 ug/mL — AB (ref 15–20)

## 2018-01-13 MED ORDER — SODIUM CHLORIDE 0.9% FLUSH
10.0000 mL | INTRAVENOUS | Status: DC | PRN
  Administered 2018-01-14: 20 mL
  Filled 2018-01-13: qty 40

## 2018-01-13 MED ORDER — SODIUM CHLORIDE 0.9 % IV SOLN: 1250.0000 mg | INTRAVENOUS | Status: DC

## 2018-01-13 MED ORDER — SODIUM CHLORIDE 0.9% FLUSH: 10.0000 mL | INTRAVENOUS | Status: DC | PRN

## 2018-01-13 MED ORDER — GLUCOSE 40 % PO GEL
ORAL | Status: AC
  Administered 2018-01-13: 07:00:00
  Filled 2018-01-13: qty 1

## 2018-01-13 NOTE — Progress Notes (Signed)
Diabetes coordinator contacted, MD notified.   MD gave verbal orders to change medications per diabetes coordinator suggestion.

## 2018-01-13 NOTE — Progress Notes (Signed)
Pharmacy Antibiotic Note  Lauren Winters is a 52 y.o. female admitted on 01/11/2018 with wound infection.  Pharmacy has been consulted for Vancomycin / Ceftriaxone  dosing.  S/p I + D of wounds and removal of pulse generator and spinal corder stimulator Received doses this evening  Vancomycin trough = 26 -- > supra-therapeutic  Plan: Ceftriaxone 2 grams iv Q 24 hours Decrease Vancomycin 1250 mg iv  Q 24 hours Follow up LOT, Scr, cultures  Height: 5\' 6"  (167.6 cm) Weight: 223 lb (101.2 kg) IBW/kg (Calculated) : 59.3  Temp (24hrs), Avg:98.4 F (36.9 C), Min:97.7 F (36.5 C), Max:98.9 F (37.2 C)  Recent Labs  Lab 01/11/18 0034 01/11/18 0103 01/11/18 0428 01/13/18 2034  WBC 10.9*  --   --   --   CREATININE 0.80  --   --   --   LATICACIDVEN  --  1.31 0.93  --   VANCOTROUGH  --   --   --  26*    Estimated Creatinine Clearance: 97.7 mL/min (by C-G formula based on SCr of 0.8 mg/dL).    Allergies  Allergen Reactions  . Pseudoephedrine Hcl Shortness Of Breath and Other (See Comments)    Room starts spinning, can't breath  . Clindamycin/Lincomycin Hives  . Prednisone Other (See Comments)    Chest pain w/ cortisone shots or prednisone pills Elevated blood pressure  . Adhesive [Tape] Rash and Other (See Comments)    Sensitivity to certain band aids and adhesives - burns skin, makes skin raw  . Other Rash and Other (See Comments)    Sensitivity to certain band aids and adhesives - burns skin, makes skin raw  . Sudafed [Pseudoephedrine Hcl] Other (See Comments)    Dizziness       Thank you for allowing pharmacy to be a part of this patient's care. Anette Guarneri, PharmD [phone]  01/13/2018 9:35 PM

## 2018-01-13 NOTE — Progress Notes (Signed)
Pt BP 109/46 @ 945. bp meds due, MD notified. Verbal orders given to hold all BP meds except beta blocker. Hold BP meds for SBP less than 140

## 2018-01-13 NOTE — Progress Notes (Signed)
Patient ID: Lauren Winters, female   DOB: 12-06-1966, 52 y.o.   MRN: [id number] Subjective: Patient reports she is doing okay. Appropriate soreness  Objective: Vital signs in last 24 hours: Temp:  [97.5 F (36.4 C)-98.9 F (37.2 C)] 98 F (36.7 C) (01/18 0918) Pulse Rate:  [57-74] 74 (01/18 0918) Resp:  [16-19] 19 (01/18 0918) BP: (80-118)/(46-69) 118/50 (01/18 0918) SpO2:  [92 %-97 %] 96 % (01/18 0918)  Intake/Output from previous day: 01/17 0701 - 01/18 0700 In: 915 [P.O.:240; I.V.:675] Out: -  Intake/Output this shift: Total I/O In: -  Out: 1 [Urine:1]  Neurologic: Grossly normal, dressings dry  Lab Results: Lab Results  Component Value Date   WBC 10.9 (H) 01/11/2018   HGB 12.3 01/11/2018   HCT 37.1 01/11/2018   MCV 83.0 01/11/2018   PLT 355 01/11/2018   Lab Results  Component Value Date   INR 1.00 01/11/2018   BMET Lab Results  Component Value Date   NA 137 01/11/2018   K 3.8 01/11/2018   CL 100 (L) 01/11/2018   CO2 25 01/11/2018   GLUCOSE 207 (H) 01/11/2018   BUN 15 01/11/2018   CREATININE 0.80 01/11/2018   CALCIUM 9.2 01/11/2018    Studies/Results: No results found.  Assessment/Plan: Continue empiric antibiotics for now. Awaiting cultures.   LOS: 2 days    Lauren Winters S 01/13/2018, 10:09 AM

## 2018-01-13 NOTE — Progress Notes (Signed)
Hypoglycemic Event  CBG: 62  Treatment: 15 GM carbohydrate snack  Symptoms: None  Follow-up CBG: [id number] CBG Result:64  Possible Reasons for Event: Inadequate meal intake  Comments/MD notified:no    Lauren Winters

## 2018-01-13 NOTE — Care Management Note (Signed)
Case Management Note  Patient Details  Name: Lauren Winters MRN: [id number] Date of Birth: 14-Jan-1966  Subjective/Objective:                    Action/Plan: Plan is for patient to d/c home with IV abx. CM met with the patient and provided her choice. She selected [facility]. Pam and Butch Penny with [facility] made aware and accepted the referral. CM following.  Expected Discharge Date:                  Expected Discharge Plan:  [place]  In-House Referral:     Discharge planning Services  CM Consult  Post Acute Care Choice:  Home Health Choice offered to:  Patient  DME Arranged:    DME Agency:     HH Arranged:  RN [place] Agency:  [facility]  Status of Service:  In process, will continue to follow  If discussed at Long Length of Stay Meetings, dates discussed:    Additional Comments:  Pollie Friar, RN 01/13/2018, 4:16 PM

## 2018-01-13 NOTE — Progress Notes (Signed)
Hypoglycemic Event  CBG: 64  Treatment: 1 tube instant glucose  Symptoms: None  Follow-up CBG: Time:0730 CBG Result:71  Possible Reasons for Event: Inadequate meal intake  Comments/MD notified:no    Lauren Winters

## 2018-01-13 NOTE — Progress Notes (Signed)
Peripherally Inserted Central Catheter/Midline Placement  The IV Nurse has discussed with the patient and/or persons authorized to consent for the patient, the purpose of this procedure and the potential benefits and risks involved with this procedure.  The benefits include less needle sticks, lab draws from the catheter, and the patient may be discharged home with the catheter. Risks include, but not limited to, infection, bleeding, blood clot (thrombus formation), and puncture of an artery; nerve damage and irregular heartbeat and possibility to perform a PICC exchange if needed/ordered by physician.  Alternatives to this procedure were also discussed.  Bard Power PICC patient education guide, fact sheet on infection prevention and patient information card has been provided to patient /or left at bedside.    PICC/Midline Placement Documentation  PICC Single Lumen 01/13/18 PICC Right Brachial 40 cm (Active)  Indication for Insertion or Continuance of Line Home intravenous therapies (PICC only) 01/13/2018  7:00 PM  Site Assessment Clean;Dry;Intact 01/13/2018  7:00 PM  Line Status Flushed;Blood return noted 01/13/2018  7:00 PM  Dressing Type Transparent 01/13/2018  7:00 PM  Dressing Status Clean;Dry;Intact;Antimicrobial disc in place 01/13/2018  7:00 PM  Dressing Change Due 01/20/18 01/13/2018  7:00 PM       [place] 01/13/2018, 7:17 PM

## 2018-01-13 NOTE — Progress Notes (Signed)
Inpatient Diabetes Program Recommendations  AACE/ADA: New Consensus Statement on Inpatient Glycemic Control (2015)  Target Ranges:  Prepandial:   less than 140 mg/dL      Peak postprandial:   less than 180 mg/dL (1-2 hours)      Critically ill patients:  140 - 180 mg/dL   Lab Results  Component Value Date   GLUCAP 182 (H) 01/13/2018   HGBA1C 8.3 (H) 11/04/2017    Review of Glycemic Control  Diabetes history: DM2 Outpatient Diabetes medications: glipizide 10 mg bid, metformin 1000 mg bid Current orders for Inpatient glycemic control: metformin 1000 mg bid, glipizide 10 mg bid, Novolog 0-15 units tidwc  Had low this am.  Inpatient Diabetes Program Recommendations:     D/C glipzide while inpatient Decrease Novolog to 0-9 units tidwc if blood sugars continue < 80 mg/dL.  Continue to follow.   Thank you. Lorenda Peck, RD, LDN, CDE Inpatient Diabetes Coordinator [phone]

## 2018-01-13 NOTE — Progress Notes (Signed)
[facility]  [facility] will provide Home Infusion Pharmacy services for pt for home IV ABX at DC.   [facility] will work with agency of choice for [facility] services.  [facility] team will prepare for weekend DC.  If patient discharges after hours, please call [phone].   Larry Sierras 01/13/2018, 11:55 AM

## 2018-01-14 LAB — GLUCOSE, CAPILLARY
GLUCOSE-CAPILLARY: 125 mg/dL — AB (ref 65–99)
GLUCOSE-CAPILLARY: 94 mg/dL (ref 65–99)
Glucose-Capillary: 88 mg/dL (ref 65–99)
Glucose-Capillary: 122 mg/dL — ABNORMAL HIGH (ref 65–99)

## 2018-01-14 LAB — BASIC METABOLIC PANEL
Anion gap: 11 (ref 5–15)
BUN: 14 mg/dL (ref 6–20)
CHLORIDE: 100 mmol/L — AB (ref 101–111)
CO2: 25 mmol/L (ref 22–32)
Calcium: 8.6 mg/dL — ABNORMAL LOW (ref 8.9–10.3)
Creatinine, Ser: 1.24 mg/dL — ABNORMAL HIGH (ref 0.44–1.00)
GFR calc Af Amer: 56 mL/min — ABNORMAL LOW (ref >60)
GFR calc non Af Amer: 49 mL/min — ABNORMAL LOW (ref >60)
GLUCOSE: 101 mg/dL — AB (ref 65–99)
Potassium: 3.9 mmol/L (ref 3.5–5.1)
SODIUM: 136 mmol/L (ref 135–145)

## 2018-01-14 LAB — AEROBIC CULTURE W GRAM STAIN (SUPERFICIAL SPECIMEN)

## 2018-01-14 LAB — AEROBIC CULTURE  (SUPERFICIAL SPECIMEN)

## 2018-01-14 MED ORDER — CEFAZOLIN SODIUM-DEXTROSE 1-4 GM/50ML-% IV SOLN
1.0000 g | Freq: Three times a day (TID) | INTRAVENOUS | Status: DC
  Administered 2018-01-14 – 2018-01-16 (×6): 1 g via INTRAVENOUS
  Filled 2018-01-14 (×8): qty 50

## 2018-01-14 MED ORDER — CEFAZOLIN SODIUM-DEXTROSE 1-4 GM/50ML-% IV SOLN
1.0000 g | Freq: Three times a day (TID) | INTRAVENOUS | Status: DC
  Filled 2018-01-14 (×3): qty 50

## 2018-01-14 MED ORDER — CEFAZOLIN IV (FOR PTA / DISCHARGE USE ONLY): 1.0000 g | Freq: Three times a day (TID) | INTRAVENOUS | 0 refills | Status: DC

## 2018-01-14 NOTE — Progress Notes (Signed)
Patient ID: Lauren Winters, female   DOB: [birth year], 52 y.o.   MRN: [id number] Subjective: The patient is alert and pleasant.  She is in no apparent distress.  Objective: Vital signs in last 24 hours: Temp:  [97.4 F (36.3 C)-98.8 F (37.1 C)] 97.8 F (36.6 C) (01/19 1017) Pulse Rate:  [53-81] 53 (01/19 1017) Resp:  [16-19] 16 (01/19 1017) BP: (115-138)/(47-81) 120/54 (01/19 1017) SpO2:  [90 %-98 %] 97 % (01/19 1017) Estimated body mass index is 35.99 kg/m as calculated from the following:   Height as of this encounter: 5\' 6"  (1.676 m).   Weight as of this encounter: 101.2 kg (223 lb).   Intake/Output from previous day: 01/18 0701 - 01/19 0700 In: 100 [IV Piggyback:100] Out: 2 [Urine:2] Intake/Output this shift: Total I/O In: 240 [P.O.:240] Out: -   Physical exam the patient is alert and pleasant.  She is moving her lower extremities well.  Lab Results: No results for input(s): WBC, HGB, HCT, PLT in the last 72 hours. BMET Recent Labs    01/14/18 0321  NA 136  K 3.9  CL 100*  CO2 25  GLUCOSE 101*  BUN 14  CREATININE 1.24*  CALCIUM 8.6*    Studies/Results: No results found.  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  The plan is to send her home on Monday.  LOS: 3 days     Ophelia Charter 01/14/2018, 12:08 PM

## 2018-01-14 NOTE — Progress Notes (Signed)
PHARMACY CONSULT NOTE FOR:  OUTPATIENT  PARENTERAL ANTIBIOTIC THERAPY (OPAT)  Indication: MSSA spinal cord stimulator infection Regimen: Ancef 1g IV q8h End date: 02/08/2018 (4 weeks of therapy per discussion with Dr. Arnoldo Morale)  IV antibiotic discharge orders are pended. To discharging provider:  please sign these orders via discharge navigator,  Select New Orders & click on the button choice - Manage This Unsigned Work.     Thank you for allowing pharmacy to be a part of this patient's care.  Tremell Reimers D. Sahib Pella, PharmD, BCPS Clinical Pharmacist Clinical Phone for 01/14/2018 until 3:30pm: [phone] If after 3:30pm, please call main pharmacy at [phone] 01/14/2018 2:22 PM

## 2018-01-15 LAB — GLUCOSE, CAPILLARY
GLUCOSE-CAPILLARY: 117 mg/dL — AB (ref 65–99)
Glucose-Capillary: 107 mg/dL — ABNORMAL HIGH (ref 65–99)
Glucose-Capillary: 144 mg/dL — ABNORMAL HIGH (ref 65–99)
Glucose-Capillary: 84 mg/dL (ref 65–99)

## 2018-01-15 LAB — URINALYSIS, ROUTINE W REFLEX MICROSCOPIC
Bilirubin Urine: NEGATIVE
GLUCOSE, UA: NEGATIVE mg/dL
Hgb urine dipstick: NEGATIVE
Ketones, ur: NEGATIVE mg/dL
LEUKOCYTES UA: NEGATIVE
NITRITE: NEGATIVE
PH: 5 (ref 5.0–8.0)
Protein, ur: NEGATIVE mg/dL
SPECIFIC GRAVITY, URINE: 1.012 (ref 1.005–1.030)

## 2018-01-15 NOTE — Progress Notes (Signed)
Patient ID: Lauren Winters, female   DOB: 1966/10/04, 52 y.o.   MRN: [id number]  I forgot to mention the patient complains of some urinary hesitancy.  She denies dysuria.  I will check a urinalysis and culture.

## 2018-01-15 NOTE — Progress Notes (Signed)
Patient ID: Lauren Winters, female   DOB: 02-Jan-1966, 52 y.o.   MRN: [id number] Subjective: The patient is alert and pleasant.  She is in no apparent distress.  She is planning on going home tomorrow.  Objective: Vital signs in last 24 hours: Temp:  [97.5 F (36.4 C)-98.3 F (36.8 C)] 98.2 F (36.8 C) (01/20 0942) Pulse Rate:  [48-64] 48 (01/20 0942) Resp:  [16-20] 16 (01/20 0942) BP: (121-154)/(47-77) 121/47 (01/20 0942) SpO2:  [92 %-98 %] 98 % (01/20 0942) Estimated body mass index is 35.99 kg/m as calculated from the following:   Height as of this encounter: 5\' 6"  (1.676 m).   Weight as of this encounter: 101.2 kg (223 lb).   Intake/Output from previous day: 01/19 0701 - 01/20 0700 In: 600 [P.O.:600] Out: -  Intake/Output this shift: No intake/output data recorded.  Physical exam the patient is alert and pleasant.  She is moving her lower extremities well.  The patient's cultures have grown MSSA  Lab Results: No results for input(s): WBC, HGB, HCT, PLT in the last 72 hours. BMET Recent Labs    01/14/18 0321  NA 136  K 3.9  CL 100*  CO2 25  GLUCOSE 101*  BUN 14  CREATININE 1.24*  CALCIUM 8.6*    Studies/Results: No results found.  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  We have change her antibiotics to cefazolin.  She will likely go home tomorrow.  LOS: 4 days     Ophelia Charter 01/15/2018, 10:25 AM

## 2018-01-15 NOTE — Plan of Care (Signed)
Pain has been decreasing since using PRN PO medication.

## 2018-01-16 LAB — CULTURE, BLOOD (ROUTINE X 2)
CULTURE: NO GROWTH
CULTURE: NO GROWTH
SPECIAL REQUESTS: ADEQUATE
Special Requests: ADEQUATE

## 2018-01-16 LAB — CBC
HEMATOCRIT: 32.5 % — AB (ref 36.0–46.0)
Hemoglobin: 10.6 g/dL — ABNORMAL LOW (ref 12.0–15.0)
MCH: 27 pg (ref 26.0–34.0)
MCHC: 32.6 g/dL (ref 30.0–36.0)
MCV: 82.9 fL (ref 78.0–100.0)
Platelets: 307 10*3/uL (ref 150–400)
RBC: 3.92 MIL/uL (ref 3.87–5.11)
RDW: 13 % (ref 11.5–15.5)
WBC: 5.8 10*3/uL (ref 4.0–10.5)

## 2018-01-16 LAB — BASIC METABOLIC PANEL
Anion gap: 12 (ref 5–15)
BUN: 15 mg/dL (ref 6–20)
CHLORIDE: 101 mmol/L (ref 101–111)
CO2: 25 mmol/L (ref 22–32)
Calcium: 8.7 mg/dL — ABNORMAL LOW (ref 8.9–10.3)
Creatinine, Ser: 1.32 mg/dL — ABNORMAL HIGH (ref 0.44–1.00)
GFR calc Af Amer: 52 mL/min — ABNORMAL LOW (ref >60)
GFR calc non Af Amer: 45 mL/min — ABNORMAL LOW (ref >60)
GLUCOSE: 138 mg/dL — AB (ref 65–99)
POTASSIUM: 3.8 mmol/L (ref 3.5–5.1)
SODIUM: 138 mmol/L (ref 135–145)

## 2018-01-16 LAB — URINE CULTURE: CULTURE: NO GROWTH

## 2018-01-16 LAB — GLUCOSE, CAPILLARY: Glucose-Capillary: 102 mg/dL — ABNORMAL HIGH (ref 65–99)

## 2018-01-16 MED ORDER — HEPARIN SOD (PORK) LOCK FLUSH 100 UNIT/ML IV SOLN
250.0000 [IU] | INTRAVENOUS | Status: AC | PRN
  Administered 2018-01-16: 250 [IU]

## 2018-01-16 MED ORDER — HYDROCODONE-ACETAMINOPHEN 7.5-325 MG PO TABS: 1.0000 | ORAL_TABLET | ORAL | 0 refills | Status: DC | PRN

## 2018-01-16 MED ORDER — CYCLOBENZAPRINE HCL 10 MG PO TABS: 10.0000 mg | ORAL_TABLET | Freq: Three times a day (TID) | ORAL | 0 refills | Status: DC | PRN

## 2018-01-16 MED ORDER — CEFAZOLIN SODIUM-DEXTROSE 1-4 GM/50ML-% IV SOLN: 1.0000 g | Freq: Three times a day (TID) | INTRAVENOUS | 0 refills | Status: DC

## 2018-01-16 NOTE — Discharge Summary (Signed)
Physician Discharge Summary  Patient ID: Lauren Winters MRN: [id number] DOB/AGE: 07/01/1966 52 y.o.  Admit date: 01/11/2018 Discharge date: 01/16/2018  Admission Diagnoses: spinal cord stim infection    Discharge Diagnoses: same   Discharged Condition: good  Hospital Course: The patient was admitted on 01/11/2018 and taken to the operating room where the patient underwent spinal cord stimulator removal. The patient tolerated the procedure well and was taken to the recovery room and then to the floor in stable condition. The hospital course was routine. There were no complications. The wound remained clean dry and intact. Pt had appropriate back soreness. No complaints of new pain or new N/T/W. The patient remained afebrile with stable vital signs, and tolerated a regular diet. The patient continued to increase activities, and pain was well controlled with oral pain medications.   Consults: None  Significant Diagnostic Studies:  Results for orders placed or performed during the hospital encounter of 01/11/18  Culture, blood (Routine x 2)  Result Value Ref Range   Specimen Description BLOOD RIGHT ARM    Special Requests      BOTTLES DRAWN AEROBIC AND ANAEROBIC Blood Culture adequate volume   Culture NO GROWTH 4 DAYS    Report Status PENDING   Culture, blood (Routine x 2)  Result Value Ref Range   Specimen Description BLOOD RIGHT HAND    Special Requests IN PEDIATRIC BOTTLE Blood Culture adequate volume    Culture NO GROWTH 4 DAYS    Report Status PENDING   Aerobic/Anaerobic Culture (surgical/deep wound)  Result Value Ref Range   Specimen Description WOUND    Special Requests LUMBAR SWABS    Gram Stain      FEW WBC PRESENT,BOTH PMN AND MONONUCLEAR NO ORGANISMS SEEN    Culture      MODERATE STAPHYLOCOCCUS AUREUS NO ANAEROBES ISOLATED; CULTURE IN PROGRESS FOR 5 DAYS    Report Status PENDING    Organism ID, Bacteria STAPHYLOCOCCUS AUREUS       Susceptibility   Staphylococcus  aureus - MIC*    CIPROFLOXACIN <=0.5 SENSITIVE Sensitive     ERYTHROMYCIN <=0.25 SENSITIVE Sensitive     GENTAMICIN <=0.5 SENSITIVE Sensitive     OXACILLIN 0.5 SENSITIVE Sensitive     TETRACYCLINE <=1 SENSITIVE Sensitive     VANCOMYCIN 1 SENSITIVE Sensitive     TRIMETH/SULFA <=10 SENSITIVE Sensitive     CLINDAMYCIN <=0.25 SENSITIVE Sensitive     RIFAMPIN <=0.5 SENSITIVE Sensitive     Inducible Clindamycin NEGATIVE Sensitive     * MODERATE STAPHYLOCOCCUS AUREUS  Aerobic Culture (superficial specimen)  Result Value Ref Range   Specimen Description BACK    Special Requests SPINAL CORD STIMULATOR IMPLANT    Gram Stain      ABUNDANT WBC PRESENT,BOTH PMN AND MONONUCLEAR FEW GRAM POSITIVE COCCI    Culture ABUNDANT STAPHYLOCOCCUS AUREUS    Report Status 01/14/2018 FINAL    Organism ID, Bacteria STAPHYLOCOCCUS AUREUS       Susceptibility   Staphylococcus aureus - MIC*    CIPROFLOXACIN <=0.5 SENSITIVE Sensitive     ERYTHROMYCIN <=0.25 SENSITIVE Sensitive     GENTAMICIN <=0.5 SENSITIVE Sensitive     OXACILLIN <=0.25 SENSITIVE Sensitive     TETRACYCLINE <=1 SENSITIVE Sensitive     VANCOMYCIN 1 SENSITIVE Sensitive     TRIMETH/SULFA <=10 SENSITIVE Sensitive     CLINDAMYCIN <=0.25 SENSITIVE Sensitive     RIFAMPIN <=0.5 SENSITIVE Sensitive     Inducible Clindamycin NEGATIVE Sensitive     * ABUNDANT STAPHYLOCOCCUS  AUREUS  Comprehensive metabolic panel  Result Value Ref Range   Sodium 137 135 - 145 mmol/L   Potassium 3.8 3.5 - 5.1 mmol/L   Chloride 100 (L) 101 - 111 mmol/L   CO2 25 22 - 32 mmol/L   Glucose, Bld 207 (H) 65 - 99 mg/dL   BUN 15 6 - 20 mg/dL   Creatinine, Ser 0.80 0.44 - 1.00 mg/dL   Calcium 9.2 8.9 - 10.3 mg/dL   Total Protein 7.7 6.5 - 8.1 g/dL   Albumin 3.5 3.5 - 5.0 g/dL   AST 20 15 - 41 U/L   ALT 13 (L) 14 - 54 U/L   Alkaline Phosphatase 109 38 - 126 U/L   Total Bilirubin 0.8 0.3 - 1.2 mg/dL   GFR calc non Af Amer >60 >60 mL/min   GFR calc Af Amer >60 >60 mL/min    Anion gap 12 5 - 15  CBC with Differential  Result Value Ref Range   WBC 10.9 (H) 4.0 - 10.5 K/uL   RBC 4.47 3.87 - 5.11 MIL/uL   Hemoglobin 12.3 12.0 - 15.0 g/dL   HCT 37.1 36.0 - 46.0 %   MCV 83.0 78.0 - 100.0 fL   MCH 27.5 26.0 - 34.0 pg   MCHC 33.2 30.0 - 36.0 g/dL   RDW 13.3 11.5 - 15.5 %   Platelets 355 150 - 400 K/uL   Neutrophils Relative % 73 %   Neutro Abs 7.9 (H) 1.7 - 7.7 K/uL   Lymphocytes Relative 17 %   Lymphs Abs 1.9 0.7 - 4.0 K/uL   Monocytes Relative 7 %   Monocytes Absolute 0.8 0.1 - 1.0 K/uL   Eosinophils Relative 3 %   Eosinophils Absolute 0.3 0.0 - 0.7 K/uL   Basophils Relative 0 %   Basophils Absolute 0.0 0.0 - 0.1 K/uL  Protime-INR  Result Value Ref Range   Prothrombin Time 13.1 11.4 - 15.2 seconds   INR 1.00   Urinalysis, Routine w reflex microscopic  Result Value Ref Range   Color, Urine YELLOW YELLOW   APPearance HAZY (A) CLEAR   Specific Gravity, Urine 1.031 (H) 1.005 - 1.030   pH 5.0 5.0 - 8.0   Glucose, UA 150 (A) NEGATIVE mg/dL   Hgb urine dipstick NEGATIVE NEGATIVE   Bilirubin Urine NEGATIVE NEGATIVE   Ketones, ur NEGATIVE NEGATIVE mg/dL   Protein, ur 30 (A) NEGATIVE mg/dL   Nitrite NEGATIVE NEGATIVE   Leukocytes, UA TRACE (A) NEGATIVE   RBC / HPF 0-5 0 - 5 RBC/hpf   WBC, UA 6-30 0 - 5 WBC/hpf   Bacteria, UA RARE (A) NONE SEEN   Squamous Epithelial / LPF 0-5 (A) NONE SEEN   Mucus PRESENT   Glucose, capillary  Result Value Ref Range   Glucose-Capillary 135 (H) 65 - 99 mg/dL  Glucose, capillary  Result Value Ref Range   Glucose-Capillary 126 (H) 65 - 99 mg/dL  Glucose, capillary  Result Value Ref Range   Glucose-Capillary 150 (H) 65 - 99 mg/dL   Comment 1 Notify RN    Comment 2 Document in Chart   Glucose, capillary  Result Value Ref Range   Glucose-Capillary 111 (H) 65 - 99 mg/dL   Comment 1 Notify RN    Comment 2 Document in Chart   Glucose, capillary  Result Value Ref Range   Glucose-Capillary 153 (H) 65 - 99 mg/dL    Comment 1 Notify RN    Comment 2 Document in Chart  Glucose, capillary  Result Value Ref Range   Glucose-Capillary 79 65 - 99 mg/dL   Comment 1 Notify RN    Comment 2 Document in Chart   Glucose, capillary  Result Value Ref Range   Glucose-Capillary 134 (H) 65 - 99 mg/dL   Comment 1 Notify RN    Comment 2 Document in Chart   Glucose, capillary  Result Value Ref Range   Glucose-Capillary 62 (L) 65 - 99 mg/dL   Comment 1 Notify RN    Comment 2 Document in Chart   Glucose, capillary  Result Value Ref Range   Glucose-Capillary 64 (L) 65 - 99 mg/dL   Comment 1 Notify RN    Comment 2 Document in Chart   Glucose, capillary  Result Value Ref Range   Glucose-Capillary 71 65 - 99 mg/dL   Comment 1 Notify RN    Comment 2 Document in Chart   Glucose, capillary  Result Value Ref Range   Glucose-Capillary 182 (H) 65 - 99 mg/dL  Glucose, capillary  Result Value Ref Range   Glucose-Capillary 172 (H) 65 - 99 mg/dL  Vancomycin, trough  Result Value Ref Range   Vancomycin Tr 26 (HH) 15 - 20 ug/mL  Glucose, capillary  Result Value Ref Range   Glucose-Capillary 96 65 - 99 mg/dL  Basic metabolic panel  Result Value Ref Range   Sodium 136 135 - 145 mmol/L   Potassium 3.9 3.5 - 5.1 mmol/L   Chloride 100 (L) 101 - 111 mmol/L   CO2 25 22 - 32 mmol/L   Glucose, Bld 101 (H) 65 - 99 mg/dL   BUN 14 6 - 20 mg/dL   Creatinine, Ser 1.24 (H) 0.44 - 1.00 mg/dL   Calcium 8.6 (L) 8.9 - 10.3 mg/dL   GFR calc non Af Amer 49 (L) >60 mL/min   GFR calc Af Amer 56 (L) >60 mL/min   Anion gap 11 5 - 15  Glucose, capillary  Result Value Ref Range   Glucose-Capillary 134 (H) 65 - 99 mg/dL   Comment 1 Notify RN    Comment 2 Document in Chart   Glucose, capillary  Result Value Ref Range   Glucose-Capillary 88 65 - 99 mg/dL   Comment 1 Notify RN    Comment 2 Document in Chart   Glucose, capillary  Result Value Ref Range   Glucose-Capillary 122 (H) 65 - 99 mg/dL   Comment 1 Notify RN    Comment 2  Document in Chart   Glucose, capillary  Result Value Ref Range   Glucose-Capillary 125 (H) 65 - 99 mg/dL   Comment 1 Notify RN    Comment 2 Document in Chart   Glucose, capillary  Result Value Ref Range   Glucose-Capillary 94 65 - 99 mg/dL   Comment 1 Notify RN    Comment 2 Document in Chart   Glucose, capillary  Result Value Ref Range   Glucose-Capillary 107 (H) 65 - 99 mg/dL   Comment 1 Notify RN    Comment 2 Document in Chart   Urinalysis, Routine w reflex microscopic  Result Value Ref Range   Color, Urine YELLOW YELLOW   APPearance HAZY (A) CLEAR   Specific Gravity, Urine 1.012 1.005 - 1.030   pH 5.0 5.0 - 8.0   Glucose, UA NEGATIVE NEGATIVE mg/dL   Hgb urine dipstick NEGATIVE NEGATIVE   Bilirubin Urine NEGATIVE NEGATIVE   Ketones, ur NEGATIVE NEGATIVE mg/dL   Protein, ur NEGATIVE NEGATIVE mg/dL   Nitrite  NEGATIVE NEGATIVE   Leukocytes, UA NEGATIVE NEGATIVE  Glucose, capillary  Result Value Ref Range   Glucose-Capillary 144 (H) 65 - 99 mg/dL  Basic metabolic panel  Result Value Ref Range   Sodium 138 135 - 145 mmol/L   Potassium 3.8 3.5 - 5.1 mmol/L   Chloride 101 101 - 111 mmol/L   CO2 25 22 - 32 mmol/L   Glucose, Bld 138 (H) 65 - 99 mg/dL   BUN 15 6 - 20 mg/dL   Creatinine, Ser 1.32 (H) 0.44 - 1.00 mg/dL   Calcium 8.7 (L) 8.9 - 10.3 mg/dL   GFR calc non Af Amer 45 (L) >60 mL/min   GFR calc Af Amer 52 (L) >60 mL/min   Anion gap 12 5 - 15  CBC  Result Value Ref Range   WBC 5.8 4.0 - 10.5 K/uL   RBC 3.92 3.87 - 5.11 MIL/uL   Hemoglobin 10.6 (L) 12.0 - 15.0 g/dL   HCT 32.5 (L) 36.0 - 46.0 %   MCV 82.9 78.0 - 100.0 fL   MCH 27.0 26.0 - 34.0 pg   MCHC 32.6 30.0 - 36.0 g/dL   RDW 13.0 11.5 - 15.5 %   Platelets 307 150 - 400 K/uL  Glucose, capillary  Result Value Ref Range   Glucose-Capillary 84 65 - 99 mg/dL  Glucose, capillary  Result Value Ref Range   Glucose-Capillary 117 (H) 65 - 99 mg/dL   Comment 1 Notify RN    Comment 2 Document in Chart    Glucose, capillary  Result Value Ref Range   Glucose-Capillary 102 (H) 65 - 99 mg/dL   Comment 1 Notify RN    Comment 2 Document in Chart   I-Stat CG4 Lactic Acid, ED  Result Value Ref Range   Lactic Acid, Venous 1.31 0.5 - 1.9 mmol/L  I-Stat beta hCG blood, ED  Result Value Ref Range   I-stat hCG, quantitative <5.0 <5 mIU/mL   Comment 3          I-stat troponin, ED  Result Value Ref Range   Troponin i, poc 0.00 0.00 - 0.08 ng/mL   Comment 3          I-Stat CG4 Lactic Acid, ED  Result Value Ref Range   Lactic Acid, Venous 0.93 0.5 - 1.9 mmol/L    Dg Chest 2 View  Result Date: 01/11/2018 CLINICAL DATA:  52 year old female with chest pain. EXAM: CHEST  2 VIEW COMPARISON:  Chest radiograph dated 12/06/2017 FINDINGS: The lungs are clear. There is no pleural effusion or pneumothorax. The cardiac silhouette is within normal limits. Spinal stimulator noted at the level of midthoracic spine. No acute osseous pathology. IMPRESSION: No active cardiopulmonary disease. Electronically Signed   By: Anner Crete M.D.   On: 01/11/2018 01:14    Antibiotics:  Anti-infectives (From admission, onward)   Start     Dose/Rate Route Frequency Ordered Stop   01/16/18 0000  ceFAZolin (ANCEF) 1-4 GM/50ML-% SOLN     1 g 100 mL/hr over 30 Minutes Intravenous Every 8 hours 01/16/18 0817     01/14/18 2200  vancomycin (VANCOCIN) 1,250 mg in sodium chloride 0.9 % 250 mL IVPB  Status:  Discontinued     1,250 mg 166.7 mL/hr over 90 Minutes Intravenous Every 24 hours 01/13/18 2135 01/14/18 1409   01/14/18 2000  ceFAZolin (ANCEF) IVPB 1 g/50 mL premix     1 g 100 mL/hr over 30 Minutes Intravenous Every 8 hours 01/14/18 1902  01/14/18 1430  ceFAZolin (ANCEF) IVPB 1 g/50 mL premix  Status:  Discontinued     1 g 100 mL/hr over 30 Minutes Intravenous Every 8 hours 01/14/18 1409 01/14/18 1902   01/14/18 0000  ceFAZolin (ANCEF) IVPB     1 g Intravenous Every 8 hours 01/14/18 1747     01/12/18 2000   cefTRIAXone (ROCEPHIN) 2 g in dextrose 5 % 50 mL IVPB  Status:  Discontinued     2 g 100 mL/hr over 30 Minutes Intravenous Every 24 hours 01/11/18 2239 01/14/18 1409   01/12/18 0800  vancomycin (VANCOCIN) 1,250 mg in sodium chloride 0.9 % 250 mL IVPB  Status:  Discontinued     1,250 mg 166.7 mL/hr over 90 Minutes Intravenous Every 12 hours 01/11/18 2239 01/13/18 2134   01/11/18 2245  hydroxychloroquine (PLAQUENIL) tablet 200 mg     200 mg Oral 2 times daily 01/11/18 2234     01/11/18 1940  vancomycin (VANCOCIN) powder  Status:  Discontinued       As needed 01/11/18 1943 01/11/18 2001   01/11/18 1930  bacitracin 50,000 Units in sodium chloride irrigation 0.9 % 500 mL irrigation  Status:  Discontinued       As needed 01/11/18 1950 01/11/18 2001   01/11/18 1915  cefTRIAXone (ROCEPHIN) 2 g in dextrose 5 % 50 mL IVPB     2 g 100 mL/hr over 30 Minutes Intravenous  Once 01/11/18 1911 01/11/18 2002   01/11/18 0615  sulfamethoxazole-trimethoprim (BACTRIM DS,SEPTRA DS) 800-160 MG per tablet 1 tablet     1 tablet Oral  Once 01/11/18 0603 01/11/18 3557      Discharge Exam: Blood pressure (!) 143/82, pulse (!) 57, temperature 97.8 F (36.6 C), temperature source Oral, resp. rate 20, height 5' 6"  (1.676 m), weight 101.2 kg (223 lb), SpO2 97 %. Neurologic: Grossly normal Ambulating and voiding well  Discharge Medications:   Allergies as of 01/16/2018      Reactions   Pseudoephedrine Hcl Shortness Of Breath, Other (See Comments)   Room starts spinning, can't breath   Clindamycin/lincomycin Hives   Prednisone Other (See Comments)   Chest pain w/ cortisone shots or prednisone pills Elevated blood pressure   Adhesive [tape] Rash, Other (See Comments)   Sensitivity to certain band aids and adhesives - burns skin, makes skin raw   Other Rash, Other (See Comments)   Sensitivity to certain band aids and adhesives - burns skin, makes skin raw   Sudafed [pseudoephedrine Hcl] Other (See Comments)    Dizziness       Medication List    TAKE these medications   amLODipine 5 MG tablet Commonly known as:  NORVASC Take 1 tablet (5 mg total) by mouth 2 (two) times daily.   buPROPion 150 MG 24 hr tablet Commonly known as:  WELLBUTRIN XL Take 1 tablet (150 mg total) by mouth daily.   ceFAZolin 1-4 GM/50ML-% Soln Commonly known as:  ANCEF Inject 50 mLs (1 g total) into the vein every 8 (eight) hours.   ceFAZolin IVPB Commonly known as:  ANCEF Inject 1 g into the vein every 8 (eight) hours. Indication:  Spinal cord stimulator infection Last Day of Therapy:  02/08/2018 Labs - Once weekly:  CBC/D and BMP, Labs - Every other week:  ESR and CRP   clobetasol cream 0.05 % Commonly known as:  TEMOVATE Apply 1 application topically 2 (two) times daily as needed (for lupis rash).   cloNIDine 0.1 MG tablet Commonly known as:  CATAPRES Take 1 tablet (0.1 mg total) by mouth 2 (two) times daily.   cyclobenzaprine 10 MG tablet Commonly known as:  FLEXERIL Take 1 tablet (10 mg total) by mouth 3 (three) times daily as needed for muscle spasms. What changed:  Another medication with the same name was added. Make sure you understand how and when to take each.   cyclobenzaprine 10 MG tablet Commonly known as:  FLEXERIL Take 1 tablet (10 mg total) by mouth 3 (three) times daily as needed for muscle spasms. What changed:  You were already taking a medication with the same name, and this prescription was added. Make sure you understand how and when to take each.   diclofenac 75 MG EC tablet Commonly known as:  VOLTAREN Take 75 mg by mouth 3 (three) times daily.   escitalopram 20 MG tablet Commonly known as:  LEXAPRO Take 20 mg by mouth daily.   folic acid 1 MG tablet Commonly known as:  FOLVITE Take 2 tablets (2 mg total) by mouth daily.   gabapentin 300 MG capsule Commonly known as:  NEURONTIN Take 600 mg by mouth 3 (three) times daily.   glipiZIDE 10 MG tablet Commonly known as:   GLUCOTROL Take 1 tablet (10 mg total) 2 (two) times daily before a meal by mouth.   Halcinonide 0.1 % Oint Apply 1 application topically 2 (two) times daily as needed (for lupis rash).   HYDROcodone-acetaminophen 7.5-325 MG tablet Commonly known as:  NORCO Take 1 tablet by mouth every 4 (four) hours as needed for moderate pain. What changed:  Another medication with the same name was added. Make sure you understand how and when to take each.   HYDROcodone-acetaminophen 7.5-325 MG tablet Commonly known as:  NORCO Take 1 tablet by mouth every 4 (four) hours as needed for moderate pain. What changed:  You were already taking a medication with the same name, and this prescription was added. Make sure you understand how and when to take each.   hydroxychloroquine 200 MG tablet Commonly known as:  PLAQUENIL Take 1 tablet (200 mg total) by mouth 2 (two) times daily.   hydrOXYzine 25 MG tablet Commonly known as:  ATARAX/VISTARIL Take 1 tablet (25 mg total) by mouth 2 (two) times daily as needed for anxiety. What changed:  when to take this   magic mouthwash w/lidocaine Soln diphenhydramine/ 2%  VISCOUS lidocaine /antacid-MAALOX (1:1:1); 15 mL swished in the mouth for 30 sec, then spit out.    May be used every 3 to 4 h   metFORMIN 1000 MG tablet Commonly known as:  GLUCOPHAGE Take 1 tablet (1,000 mg total) by mouth 2 (two) times daily with a meal.   methotrexate 50 MG/2ML injection Inject 20 mg into the muscle every Tuesday.   metoprolol tartrate 100 MG tablet Commonly known as:  LOPRESSOR Take 1 tablet (100 mg total) by mouth 2 (two) times daily.   pilocarpine 5 MG tablet Commonly known as:  SALAGEN Take 5 mg by mouth daily as needed (for dry mouth).   sulfamethoxazole-trimethoprim 800-160 MG tablet Commonly known as:  BACTRIM DS,SEPTRA DS Take 1 tablet by mouth 2 (two) times daily.   tacrolimus 0.1 % ointment Commonly known as:  PROTOPIC Apply 1 application topically 2  (two) times daily as needed (for lupis rash).   triamcinolone 0.1 % cream : eucerin Crea Apply 1 application topically 2 (two) times daily as needed for rash or irritation.   valsartan-hydrochlorothiazide 320-25 MG tablet Commonly known as:  DIOVAN-HCT TAKE 1 TABLET BY MOUTH DAILY.   Vitamin D (Ergocalciferol) 50000 units Caps capsule Commonly known as:  DRISDOL Take 1 capsule (50,000 Units total) by mouth 2 (two) times a week.            Home Infusion Instuctions  (From admission, onward)        Start     Ordered   01/14/18 0000  Home infusion instructions [facility] May follow [place] Dosing Protocol; May administer Cathflo as needed to maintain patency of vascular access device.; Flushing of vascular access device: per [facility] Protocol: 0.9% NaCl pre/post medica...    Question Answer Comment  Instructions May follow [place] Dosing Protocol   Instructions May administer Cathflo as needed to maintain patency of vascular access device.   Instructions Flushing of vascular access device: per [facility] Protocol: 0.9% NaCl pre/post medication administration and prn patency; Heparin 100 u/ml, 57m for implanted ports and Heparin 10u/ml, 564mfor all other central venous catheters.   Instructions May follow [facility] Anaphylaxis Protocol for First Dose Administration in the home: 0.9% NaCl at 25-50 ml/hr to maintain IV access for protocol meds. Epinephrine 0.3 ml IV/IM PRN and Benadryl 25-50 IV/IM PRN s/s of anaphylaxis.   Instructions [facility] Infusion Coordinator (RN) to assist per patient IV care needs in the home PRN.      01/14/18 1747      Disposition: home   Final Dx: same  Discharge Instructions    Call MD for:  difficulty breathing, headache or visual disturbances   Complete by:  As directed    Call MD for:  extreme fatigue   Complete by:  As directed    Call MD for:  hives   Complete by:  As directed    Call MD for:  persistant dizziness or  light-headedness   Complete by:  As directed    Call MD for:  persistant nausea and vomiting   Complete by:  As directed    Call MD for:  redness, tenderness, or signs of infection (pain, swelling, redness, odor or green/yellow discharge around incision site)   Complete by:  As directed    Call MD for:  severe uncontrolled pain   Complete by:  As directed    Call MD for:  temperature >100.4   Complete by:  As directed    Diet - low sodium heart healthy   Complete by:  As directed    Home infusion instructions [facility] Protocol; May administer Cathflo as needed to maintain patency of vascular access device.; Fl[facility]: 0.9% NaCl pre/post medica...   Complete by:  As directed    Instructions:  May follow [facility] [place] Protocol   Instructions:  May administer Cathflo as needed to maintain patency of vascular access device.   Instructions:  Flushing of vascular access [facility]: 0.9% NaCl pre/post medication administration and prn patency; Heparin 100 u/ml, 32m64mor implanted ports and Heparin 10u/ml, 32ml532mr all other central venous catheters.   Instructions:  May follow [facility] Anaphylaxis Protocol for First Dose Administration in the home: 0.9% NaCl at 25-50 ml/hr to maintain IV access for protocol meds. Epinephrine 0.3 ml IV/IM PRN and Benadryl 25-50 IV/IM PRN s/s of anaphylaxis.   Instructions:  [facility] Coord[facility] (RN) to assist per patient IV care needs in the home PRN.   Increase activity slowly   Complete  by:  As directed          Signed: Ocie Cornfield Terissa Haffey 01/16/2018, 8:17 AM

## 2018-01-16 NOTE — Care Management Note (Signed)
Case Management Note  Patient Details  Name: Lauren Winters MRN: [id number] Date of Birth: 02-12-1966  Subjective/Objective:                    Action/Plan: Pt discharging home with IV antibiotics. She will be on Ancef until 02/08/2018. Pam with [place] IV therapy notified of d/c. Plan is for her to d/c after 2 pm dose and have the last dose for today at her mother's home: [facility] in [place]. She is going to stay with her mother so she will have 24/7 supervision. Pam updated with new address.  Pt has transportation home.   Expected Discharge Date:  01/16/18               Expected Discharge Plan:  [place]  In-House Referral:     Discharge planning Services  CM Consult  Post Acute Care Choice:  Home Health Choice offered to:  Patient  DME Arranged:    DME Agency:     HH Arranged:  RN [place] Agency:  [place]  Status of Service:  Completed, signed off  If discussed at [place] of Stay Meetings, dates discussed:    Additional Comments:  Pollie Friar, RN 01/16/2018, 11:53 AM

## 2018-01-16 NOTE — Progress Notes (Signed)
Discharge instructions, RX's and follow up appts explained and provided to patient and c/g verbalized understanding. [facility] set up for IV antibiotics contact information provided. Patient left floor via wheelchair accompanied by staff no c/o pain or shortness of breath at d/c.  Yakub Lodes, Tivis Ringer, RN

## 2018-01-17 LAB — AEROBIC/ANAEROBIC CULTURE (SURGICAL/DEEP WOUND)

## 2018-01-17 LAB — GLUCOSE, CAPILLARY: GLUCOSE-CAPILLARY: 124 mg/dL — AB (ref 65–99)

## 2018-01-17 LAB — AEROBIC/ANAEROBIC CULTURE W GRAM STAIN (SURGICAL/DEEP WOUND)

## 2018-01-19 ENCOUNTER — Telehealth: Payer: Self-pay | Admitting: Primary Care

## 2018-01-19 ENCOUNTER — Ambulatory Visit: Payer: BLUE CROSS/BLUE SHIELD | Admitting: Psychology

## 2018-01-19 NOTE — Telephone Encounter (Signed)
Copied from [place] [id number]. Topic: Quick Communication - See Telephone Encounter >> Jan 19, 2018  4:15 PM Ivar Drape wrote: CRM for notification. See Telephone encounter for:  01/19/18. Tammi w/[employer] [phone] has a referral for case mgmt for the patient.  She has called the patient with no success to get in touch with her. She said can the office tell the patient she is trying to reach her.  Also the patient was recently in the hospital for a spinal cord stimulator infection.

## 2018-01-19 NOTE — Telephone Encounter (Signed)
Unable to reach pt by phone.

## 2018-01-20 ENCOUNTER — Telehealth: Payer: Self-pay | Admitting: Primary Care

## 2018-01-20 NOTE — Telephone Encounter (Signed)
Copied from [place]. Topic: General - Other >> Jan 20, 2018  2:44 PM Yvette Rack wrote: Reason for CRM: Tammy from [facility] [phone] is calling to let the patients provider know that patient feels like she is getting all the information and the right questions from [place] and don't need case management now

## 2018-01-20 NOTE — Telephone Encounter (Signed)
Per DPR, left detail message for patient to contact [place] with [facility]

## 2018-01-20 NOTE — Telephone Encounter (Signed)
Noted.  I did not initiate case management.

## 2018-01-26 ENCOUNTER — Ambulatory Visit: Payer: BLUE CROSS/BLUE SHIELD | Admitting: Psychology

## 2018-01-28 ENCOUNTER — Encounter: Payer: Self-pay | Admitting: Rheumatology

## 2018-01-30 ENCOUNTER — Encounter: Payer: Self-pay | Admitting: Rheumatology

## 2018-02-02 ENCOUNTER — Ambulatory Visit: Payer: Self-pay | Admitting: Psychology

## 2018-02-03 DIAGNOSIS — Z79899 Other long term (current) drug therapy: Secondary | ICD-10-CM | POA: Insufficient documentation

## 2018-02-03 NOTE — Progress Notes (Addendum)
Office Visit Note  Patient: Lauren Winters             Date of Birth: Feb 29, 1966           MRN: [id number]             PCP: Pleas Koch, NP Referring: Pleas Koch, NP Visit Date: 02/14/2018 Occupation: @GUAROCC @    Subjective:  Right knee pain    History of Present Illness: Lauren Winters is a 52 y.o. female with history of systemic lupus, Sjogren's, fibromyalgia, and osteoarthritis.  Patient states that in December 2018 she has a spinal cord stimulator placed by Dr. Ronnald Ramp.  She had an infection 4 weeks later was hospitalized for 1 week after having the stimulator removed.  She has been off of MTX 0.8 ml since before her procedure in December.  She would like to switch to the injectable if her insurance will cover it rather than the syringe.  She continued to take PLQ 200 mg BID.  She is going to schedule her eye exam. She states her lupus flared in December as well.  She reports she had oral ulcers, cervical lymphadenopathy, and a rash on her trunk and face.  She states that she continues to have hair loss.  She has been having a low grade fever occasionally as well as recurrent oral ulcers. She states she is having pain in her right hand, neck, shoulders, and lower back.  She continues to have chronic knee pain as well.  Her left knee pain improved following Hyalgan injections.  Her right knee did not get much relief, so she would like to try the gel injections again for her right knee.  She denies any joint swelling. She continues to have sicca symptoms.  She takes Pilocarpine PRN.            She states her fibromyalgia has been flaring off and on since her procedure.  She states she is having muscle aches and muscle tenderness.  She also has increased fatigue and insomnia.  She states that on a good night she will get a max of 5 hours of sleep at night.  She has interrupted sleep at night due to her level of pain as well.  She reports gabapentin makes her drowsy.  She takes  Gabapentin 600 mg TID and Flexeril 10 mg PRN.  She does not take anything additional to help her sleep.     Activities of Daily Living:  Patient reports morning stiffness for 2-3 hours.   Patient Reports nocturnal pain.  Difficulty dressing/grooming: Denies Difficulty climbing stairs: Reports Difficulty getting out of chair: Reports Difficulty using hands for taps, buttons, cutlery, and/or writing: Reports   Review of Systems  Constitutional: Positive for fatigue. Negative for weakness.  HENT: Positive for mouth sores, mouth dryness and nose dryness.   Eyes: Positive for dryness. Negative for photophobia, pain, redness and visual disturbance.  Respiratory: Positive for cough. Negative for hemoptysis, shortness of breath and difficulty breathing.   Cardiovascular: Negative for chest pain, palpitations, hypertension, irregular heartbeat and swelling in legs/feet.  Gastrointestinal: Negative for blood in stool, constipation and diarrhea.  Endocrine: Negative for increased urination.  Genitourinary: Negative for painful urination.  Musculoskeletal: Positive for arthralgias, joint pain, myalgias, muscle weakness, morning stiffness and myalgias. Negative for joint swelling and muscle tenderness.  Skin: Positive for color change, rash and hair loss. Negative for pallor, nodules/bumps, redness, skin tightness, ulcers and sensitivity to sunlight.  Allergic/Immunologic: Negative for  susceptible to infections.  Neurological: Positive for headaches. Negative for dizziness and numbness.  Hematological: Positive for swollen glands.  Psychiatric/Behavioral: Positive for depressed mood and sleep disturbance. The patient is nervous/anxious.     PMFS History:  Patient Active Problem List   Diagnosis Date Noted  . High risk medication use 02/03/2018  . Wound infection after surgery 01/11/2018  . S/P lumbar laminectomy 12/09/2017  . Paresthesia 05/11/2017  . HNP (herniated nucleus pulposus), lumbar  03/29/2017  . Chronic right shoulder pain 02/18/2017  . Primary osteoarthritis of both hands 12/22/2016  . Primary osteoarthritis of both knees 12/22/2016  . Spondylosis of lumbar region without myelopathy or radiculopathy 12/22/2016  . Fibromyalgia 12/22/2016  . Recurrent oral ulcers 12/22/2016  . Preventative health care 04/19/2016  . Chronic back pain 12/17/2015  . Hyperlipidemia 08/29/2015  . Other organ or system involvement in systemic lupus erythematosus ([place]) 08/04/2015  . Rheumatoid factor positive 08/04/2015  . Sjogren's syndrome ([place]) 08/04/2015  . Mitral regurgitation   . Tobacco abuse   . Atypical chest pain   . Obesity (BMI 30-39.9)   . Anxiety   . Hypertension   . Diabetes mellitus without complication ([place])   . Clinical depression 05/23/2014    Past Medical History:  Diagnosis Date  . Anxiety   . Atypical chest pain    a. ETT 05/2014: normal stress test without evidence of myocardial ischemia or chest pain at peak stress  . Complication of anesthesia    migraine headache  . Depression   . Diabetes mellitus without complication ([place]) metformin   dx 2015  . Dyspnea    on exertion  . Fibromyalgia   . Headache   . Heart murmur    told back in 2007, but no mention with 2016 cardiac w/u  . Hypertension   . Lupus (systemic lupus erythematosus) ([place])   . Mitral regurgitation    a. 05/2014 echo: EF >55%, nl LV/RV sys fxn, mild MR/AR, no valvular stenosis   . Obesity (BMI 30-39.9)   . Raynaud's phenomenon   . Rheumatoid arthritis ([place])   . Sjogren's disease ([place])   . Tobacco abuse    a. ongoing; b. ongoing SOB    Family History  Problem Relation Age of Onset  . Hypertension Mother   . Valvular heart disease Mother   . CAD Father 61       CABG x 4  . Liver cancer Father 43       passed  . CAD Paternal Aunt        CABG  . Breast cancer Paternal Aunt   . CAD Paternal Aunt        CABG  . Breast cancer Paternal Aunt   . CAD Paternal Aunt        CABG  .  Breast cancer Paternal Aunt   . CAD Paternal Aunt        CABG  . Breast cancer Paternal Aunt   . CAD Paternal Aunt        CABG  . Breast cancer Paternal Aunt   . CAD Paternal Uncle        MI s/p CABG  . Stroke Maternal Grandmother   . Stroke Maternal Grandfather   . CAD Maternal Grandfather    Past Surgical History:  Procedure Laterality Date  . ABDOMINAL HYSTERECTOMY    . BACK SURGERY    . CESAREAN SECTION     x3  . LUMBAR LAMINECTOMY    . LUMBAR LAMINECTOMY/DECOMPRESSION  MICRODISCECTOMY N/A 03/29/2017   Procedure: Re operative Right Lumbar Three-Four Laminectomy and discectomy with Left Lumbar Three-Four, Bilateral Lumbar Four-Five Laminectomy for decompression;  Surgeon: Kevan Ny Ditty, MD;  Location: [facility];  Service: Neurosurgery;  Laterality: N/A;  . SPINAL CORD STIMULATOR INSERTION  12/09/2017   Procedure: LUMBAR SPINAL CORD STIMULATOR IMPLANT;  Surgeon: Eustace Moore, MD;  Location: [facility];  Service: Neurosurgery;;  . SPINAL CORD STIMULATOR REMOVAL N/A 01/11/2018   Procedure: LUMBAR SPINAL CORD STIMULATOR REMOVAL;  Surgeon: Eustace Moore, MD;  Location: [facility];  Service: Neurosurgery;  Laterality: N/A;  . TUBAL LIGATION     Social History   Social History Narrative   Married.   3 children.   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      Objective: Vital Signs: BP (!) 150/93 (BP Location: Left Arm, Patient Position: Sitting, Cuff Size: Normal)   Pulse 97   Resp 17   Ht 5\' 6"  (1.676 m)   Wt 222 lb (100.7 kg)   BMI 35.83 kg/m    Physical Exam  Constitutional: She is oriented to person, place, and time. She appears well-developed and well-nourished.  HENT:  Head: Normocephalic and atraumatic.  No oral or nasal ulcers present. No parotid swelling.  Eyes: Conjunctivae and EOM are normal.  Neck: Normal range of motion.  Cardiovascular: Normal rate, regular rhythm, normal heart sounds and intact distal pulses.    Pulmonary/Chest: Effort normal and breath sounds normal.  Abdominal: Soft. Bowel sounds are normal.  Lymphadenopathy:    She has no cervical adenopathy.  Neurological: She is alert and oriented to person, place, and time.  Skin: Skin is warm and dry. Capillary refill takes less than 2 seconds.  Psychiatric: She has a normal mood and affect. Her behavior is normal.  Nursing note and vitals reviewed.    Musculoskeletal Exam: C-spine good ROM.  Thoracic and lumbar spine limited ROM.  She is walking with a cane.  She has limited shoulder abduction with discomfort.  Elbow joints, wrist joints, MCPs, PIPs, and DIPs good ROM with no synovitis.  Hip joints very limited ROM with significant discomfort.  Limited knee extension bilaterally with discomfort.  No warmth or effusion. Ankles, MTPs, PIPs, and DIPs good ROM with no synovitis.    CDAI Exam: No CDAI exam completed.    Investigation: No additional findings. CBC Latest Ref Rng & Units 01/16/2018 01/11/2018 12/06/2017  WBC 4.0 - 10.5 K/uL 5.8 10.9(H) 7.9  Hemoglobin 12.0 - 15.0 g/dL 10.6(L) 12.3 13.4  Hematocrit 36.0 - 46.0 % 32.5(L) 37.1 39.0  Platelets 150 - 400 K/uL 307 355 291   CMP Latest Ref Rng & Units 01/16/2018 01/14/2018 01/11/2018  Glucose 65 - 99 mg/dL 138(H) 101(H) 207(H)  BUN 6 - 20 mg/dL 15 14 15   Creatinine 0.44 - 1.00 mg/dL 1.32(H) 1.24(H) 0.80  Sodium 135 - 145 mmol/L 138 136 137  Potassium 3.5 - 5.1 mmol/L 3.8 3.9 3.8  Chloride 101 - 111 mmol/L 101 100(L) 100(L)  CO2 22 - 32 mmol/L 25 25 25   Calcium 8.9 - 10.3 mg/dL 8.7(L) 8.6(L) 9.2  Total Protein 6.5 - 8.1 g/dL - - 7.7  Total Bilirubin 0.3 - 1.2 mg/dL - - 0.8  Alkaline Phos 38 - 126 U/L - - 109  AST 15 - 41 U/L - - 20  ALT 14 - 54 U/L - - 13(L)    Imaging: No results found.  Speciality Comments: No specialty comments available.  Procedures:  No procedures performed Allergies: Pseudoephedrine hcl; Clindamycin/lincomycin; Ancef [cefazolin]; Prednisone;  Adhesive [tape]; Other; and Sudafed [pseudoephedrine hcl]   Assessment / Plan:     Visit Diagnoses: Other organ or system involvement in systemic lupus erythematosus ([place]) -She will continue on PLQ 200 mg BID. She has been off of MTX since December 2018 due to having a spinal cord stimulator procedure and then an infection following. We are applying for MTX 0.8 ml injectable instead of syringe for her due to OA in her hands making the syringe more challenging.  If her insurance does not cover the injectable, she would like to switch to MTX tablets.  I will wait to refill her MXT until the prior authorization is complete.  She will resume taking folic acid 2 mg daily when she restarts her MTX.  She has increased fatigue and has been running a low grade fever lately.  No oral or nasal ulcers are present on exam today.  No cervical lymphadenopathy.  She has rash on her trunk but biopsy proved it was due to her recent use of antibiotics.  Autoimmune labs were obtained today.  Plan: Sedimentation rate, C-reactive protein, Urinalysis, Routine w reflex microscopic, Anti-DNA antibody, double-stranded, C3 and C4, VITAMIN D 25 Hydroxy (Vit-D Deficiency, Fractures), ANA  Recurrent oral ulcers: She continues to have recurrent oral ulcers.  No oral or nasal ulcers noted today on exam.   Sjogren's syndrome with other organ involvement ([place]) - She continues to have sicca symptoms.  She reports that Pilocarpine 5 mg PRN has been helping.  RF and SPEP were ordered today.  She had no parotid swelling on exam. - Plan: Rheumatoid factor, Serum protein electrophoresis with reflex  High risk medication use - MTX 0.8 ml weekly, folic acid 2 mg daily, PLQ 200 mg BIDCBC and CMP 01/16/18 PLQ eye exam? - Plan: CBC with Differential/Platelet, COMPLETE METABOLIC PANEL WITH GFR  Fibromyalgia - Her fibromyalgia has been flaring since December.  She has been having increased fatigue, insomnia, and generalized pain. She takes Flexeril 10  mg PRN and Gabapentin 600 mg TID.  She was encouraged to try to get in a better sleep routine and practice good sleep hygiene.    Primary osteoarthritis of both hands: She has mild DIP synovial thickening consistent with osteoarthritis.  She has right CMC joint pain.  Joint protection and muscle strengthening were discussed.    Primary osteoarthritis of both knees: She has limited extension of bilateral knees.  No warmth, effusion, or crepitus.  We are going to reapply for Hyalgan injections for her right knee.  She cannot have repeat injections until March 2019.  Her left knee responded well previously.   Rheumatoid factor positive: SPEP was ordered today.    S/P lumbar laminectomy: Chronic lower back pain.  She sees Dr. Ronnald Ramp.   Raynaud's syndrome without gangrene: No ulcerations or signs of gangrene present.  She was reminded to wear gloves and thick socks as well as keeping her core body temperature warm.   Vitamin D deficiency - She is no longer taking vitamin D supplements.  a vitamin D level was checked today. Plan: VITAMIN D 25 Hydroxy (Vit-D Deficiency, Fractures)   Other medical conditions are listed as follows:   Tobacco abuse  Hyperlipidemia, unspecified hyperlipidemia type  Essential hypertension  Mitral valve insufficiency, unspecified etiology  Diabetes mellitus without complication ([place])    Orders: Orders Placed This Encounter  Procedures  . Sedimentation rate  . C-reactive protein  . CBC  with Differential/Platelet  . COMPLETE METABOLIC PANEL WITH GFR  . Urinalysis, Routine w reflex microscopic  . Anti-DNA antibody, double-stranded  . C3 and C4  . VITAMIN D 25 Hydroxy (Vit-D Deficiency, Fractures)  . ANA  . Rheumatoid factor  . Serum protein electrophoresis with reflex   No orders of the defined types were placed in this encounter.   Face-to-face time spent with patient was 30 minutes. Greater than 50% of time was spent in counseling and coordination of  care.  Follow-Up Instructions: Return in about 5 months (around 07/14/2018) for Systemic lupus erythematosus, Sjogren's syndrome, Fibromyalgia, Osteoarthritis.   Ofilia Neas, PA-C   I examined and evaluated the patient with Hazel Sams PA. The plan of care was discussed as noted above.  Bo Merino, MD  Note - This record has been created using Editor, commissioning.  Chart creation errors have been sought, but may not always  have been located. Such creation errors do not reflect on  the standard of medical care.

## 2018-02-06 ENCOUNTER — Other Ambulatory Visit: Payer: Self-pay | Admitting: Pharmacist

## 2018-02-07 DIAGNOSIS — T148XXA Other injury of unspecified body region, initial encounter: Secondary | ICD-10-CM | POA: Insufficient documentation

## 2018-02-07 DIAGNOSIS — L089 Local infection of the skin and subcutaneous tissue, unspecified: Secondary | ICD-10-CM | POA: Insufficient documentation

## 2018-02-07 HISTORY — DX: Local infection of the skin and subcutaneous tissue, unspecified: L08.9

## 2018-02-09 ENCOUNTER — Ambulatory Visit: Payer: BLUE CROSS/BLUE SHIELD | Admitting: Psychology

## 2018-02-14 ENCOUNTER — Ambulatory Visit: Payer: BLUE CROSS/BLUE SHIELD | Admitting: Rheumatology

## 2018-02-14 ENCOUNTER — Encounter: Payer: Self-pay | Admitting: Physician Assistant

## 2018-02-14 ENCOUNTER — Telehealth: Payer: Self-pay

## 2018-02-14 VITALS — BP 150/93 | HR 97 | Resp 17 | Ht 66.0 in | Wt 222.0 lb

## 2018-02-14 DIAGNOSIS — Z720 Tobacco use: Secondary | ICD-10-CM | POA: Diagnosis not present

## 2018-02-14 DIAGNOSIS — M170 Bilateral primary osteoarthritis of knee: Secondary | ICD-10-CM

## 2018-02-14 DIAGNOSIS — M19041 Primary osteoarthritis, right hand: Secondary | ICD-10-CM

## 2018-02-14 DIAGNOSIS — I10 Essential (primary) hypertension: Secondary | ICD-10-CM | POA: Diagnosis not present

## 2018-02-14 DIAGNOSIS — M797 Fibromyalgia: Secondary | ICD-10-CM | POA: Diagnosis not present

## 2018-02-14 DIAGNOSIS — I7300 Raynaud's syndrome without gangrene: Secondary | ICD-10-CM

## 2018-02-14 DIAGNOSIS — E119 Type 2 diabetes mellitus without complications: Secondary | ICD-10-CM

## 2018-02-14 DIAGNOSIS — R768 Other specified abnormal immunological findings in serum: Secondary | ICD-10-CM | POA: Diagnosis not present

## 2018-02-14 DIAGNOSIS — Z9889 Other specified postprocedural states: Secondary | ICD-10-CM | POA: Diagnosis not present

## 2018-02-14 DIAGNOSIS — M3509 Sicca syndrome with other organ involvement: Secondary | ICD-10-CM

## 2018-02-14 DIAGNOSIS — K1379 Other lesions of oral mucosa: Secondary | ICD-10-CM

## 2018-02-14 DIAGNOSIS — E785 Hyperlipidemia, unspecified: Secondary | ICD-10-CM

## 2018-02-14 DIAGNOSIS — M3219 Other organ or system involvement in systemic lupus erythematosus: Secondary | ICD-10-CM | POA: Diagnosis not present

## 2018-02-14 DIAGNOSIS — I340 Nonrheumatic mitral (valve) insufficiency: Secondary | ICD-10-CM

## 2018-02-14 DIAGNOSIS — E559 Vitamin D deficiency, unspecified: Secondary | ICD-10-CM

## 2018-02-14 DIAGNOSIS — Z79899 Other long term (current) drug therapy: Secondary | ICD-10-CM

## 2018-02-14 DIAGNOSIS — M19042 Primary osteoarthritis, left hand: Secondary | ICD-10-CM

## 2018-02-14 NOTE — Telephone Encounter (Signed)
Was asked to submit a prior auth for MTX pens. Auth for Otrexup was submitted to pts insurance via cover my meds. Will update once we receive a response.   Sherise Geerdes, [place], CPhT 3:14 PM

## 2018-02-14 NOTE — Patient Instructions (Addendum)
Standing Labs We placed an order today for your standing lab work.    Please come back and get your standing labs in May and every 3 months  We have open lab Monday through Friday from 8:30-11:30 AM and 1:30-4 PM at the office of Dr. Shaili Deveshwar.   The office is located at [address], [address], [place], [place] [address] No appointment is necessary.   Labs are drawn by [facility].  You may receive a bill from [facility] for your lab work. If you have any questions regarding directions or hours of operation,  please call [phone].    

## 2018-02-15 NOTE — Progress Notes (Signed)
Vitamin D is low.  Please send in vitamin D 50,000 units po twice a week x3 months.  We will recheck vitamin D in 3 months.   Sed rate is elevated.   Glucose elevated. Autoimmune labs are WNL.

## 2018-02-16 ENCOUNTER — Ambulatory Visit: Payer: BLUE CROSS/BLUE SHIELD | Admitting: Psychology

## 2018-02-16 ENCOUNTER — Telehealth: Payer: Self-pay | Admitting: Rheumatology

## 2018-02-16 DIAGNOSIS — E559 Vitamin D deficiency, unspecified: Secondary | ICD-10-CM

## 2018-02-16 LAB — CBC WITH DIFFERENTIAL/PLATELET
BASOS ABS: 68 {cells}/uL (ref 0–200)
Basophils Relative: 0.9 %
EOS ABS: 274 {cells}/uL (ref 15–500)
Eosinophils Relative: 3.6 %
HEMATOCRIT: 38.9 % (ref 35.0–45.0)
Hemoglobin: 13 g/dL (ref 11.7–15.5)
LYMPHS ABS: 1771 {cells}/uL (ref 850–3900)
MCH: 26.5 pg — AB (ref 27.0–33.0)
MCHC: 33.4 g/dL (ref 32.0–36.0)
MCV: 79.4 fL — ABNORMAL LOW (ref 80.0–100.0)
MPV: 9.7 fL (ref 7.5–12.5)
Monocytes Relative: 6.2 %
NEUTROS PCT: 66 %
Neutro Abs: 5016 cells/uL (ref 1500–7800)
PLATELETS: 378 10*3/uL (ref 140–400)
RBC: 4.9 10*6/uL (ref 3.80–5.10)
RDW: 13.9 % (ref 11.0–15.0)
TOTAL LYMPHOCYTE: 23.3 %
WBC: 7.6 10*3/uL (ref 3.8–10.8)
WBCMIX: 471 {cells}/uL (ref 200–950)

## 2018-02-16 LAB — URINALYSIS, ROUTINE W REFLEX MICROSCOPIC
Bilirubin Urine: NEGATIVE
Hgb urine dipstick: NEGATIVE
Ketones, ur: NEGATIVE
Leukocytes, UA: NEGATIVE
NITRITE: NEGATIVE
Protein, ur: NEGATIVE
SPECIFIC GRAVITY, URINE: 1.027 (ref 1.001–1.03)
pH: 5.5 (ref 5.0–8.0)

## 2018-02-16 LAB — PROTEIN ELECTROPHORESIS, SERUM, WITH REFLEX
Albumin ELP: 4.4 g/dL (ref 3.8–4.8)
Alpha 1: 0.3 g/dL (ref 0.2–0.3)
Alpha 2: 0.7 g/dL (ref 0.5–0.9)
Beta 2: 0.5 g/dL (ref 0.2–0.5)
Beta Globulin: 0.6 g/dL (ref 0.4–0.6)
GAMMA GLOBULIN: 1.9 g/dL — AB (ref 0.8–1.7)
TOTAL PROTEIN: 8.4 g/dL — AB (ref 6.1–8.1)

## 2018-02-16 LAB — COMPLETE METABOLIC PANEL WITH GFR
AG RATIO: 1.1 (calc) (ref 1.0–2.5)
ALT: 13 U/L (ref 6–29)
AST: 21 U/L (ref 10–35)
Albumin: 4.3 g/dL (ref 3.6–5.1)
Alkaline phosphatase (APISO): 118 U/L (ref 33–130)
BILIRUBIN TOTAL: 0.7 mg/dL (ref 0.2–1.2)
BUN: 23 mg/dL (ref 7–25)
CALCIUM: 9.7 mg/dL (ref 8.6–10.4)
CHLORIDE: 99 mmol/L (ref 98–110)
CO2: 25 mmol/L (ref 20–32)
Creat: 0.98 mg/dL (ref 0.50–1.05)
GFR, Est African American: 76 mL/min/{1.73_m2} (ref >=60)
GFR, Est Non African American: 66 mL/min/{1.73_m2} (ref >=60)
GLOBULIN: 4 g/dL — AB (ref 1.9–3.7)
Glucose, Bld: 192 mg/dL — ABNORMAL HIGH (ref 65–99)
POTASSIUM: 3.8 mmol/L (ref 3.5–5.3)
SODIUM: 135 mmol/L (ref 135–146)
Total Protein: 8.3 g/dL — ABNORMAL HIGH (ref 6.1–8.1)

## 2018-02-16 LAB — SEDIMENTATION RATE: Sed Rate: 36 mm/h — ABNORMAL HIGH (ref 0–30)

## 2018-02-16 LAB — VITAMIN D 25 HYDROXY (VIT D DEFICIENCY, FRACTURES): Vit D, 25-Hydroxy: 22 ng/mL — ABNORMAL LOW (ref 30–100)

## 2018-02-16 LAB — C3 AND C4
C3 Complement: 172 mg/dL (ref 83–193)
C4 COMPLEMENT: 22 mg/dL (ref 15–57)

## 2018-02-16 LAB — RHEUMATOID FACTOR

## 2018-02-16 LAB — ANTI-DNA ANTIBODY, DOUBLE-STRANDED: ds DNA Ab: <1 IU/mL

## 2018-02-16 LAB — ANA: ANA: NEGATIVE

## 2018-02-16 LAB — C-REACTIVE PROTEIN: CRP: 4.1 mg/L (ref <8.0)

## 2018-02-16 MED ORDER — VITAMIN D (ERGOCALCIFEROL) 1.25 MG (50000 UNIT) PO CAPS: 50000.0000 [IU] | ORAL_CAPSULE | ORAL | 0 refills | Status: DC

## 2018-02-16 MED ORDER — HYDROXYCHLOROQUINE SULFATE 200 MG PO TABS: 200.0000 mg | ORAL_TABLET | Freq: Two times a day (BID) | ORAL | 0 refills | Status: DC

## 2018-02-16 NOTE — Telephone Encounter (Signed)
Patient advised of lab results and prescription for vitamin d sent to the pharmacy. Patient also requested a refill on PLQ.   Last Visit: 02/14/18 Next Visit: 07/18/18 Labs: 02/14/18 Elevated glucose. PLQ Eye Exam: 11/2016 WNL  Okay to refill per Dr. Estanislado Pandy

## 2018-02-16 NOTE — Telephone Encounter (Signed)
Okay to change to tablets for methotrexate.  Her dose will be methotrexate 8 tablets p.o. weekly as she is on 20 mg subcu weekly currently.

## 2018-02-16 NOTE — Telephone Encounter (Signed)
Patient returned your call regarding her test results

## 2018-02-16 NOTE — Telephone Encounter (Signed)
Received a fax from [facility] regarding a prior authorization [place] for [place].  Otrexup is only covered when patient has tried and failed MTX tablets.   Reference number: [id number] Phone number: [phone]  Will send document to scan center.  Called pt to update. She would like to try taking the tablets. If she can't tolerate those, we can apply for the Otrexup pen. She is currently using the vial and syringe but is having a hard time with it.   Can we change to tablets? If so, please send a new Rx to [facility] in [place]. Thanks!  Asyia Hornung, [place], CPhT 2:26 PM

## 2018-02-17 MED ORDER — METHOTREXATE 2.5 MG PO TABS: 20.0000 mg | ORAL_TABLET | ORAL | 0 refills | Status: DC

## 2018-02-17 NOTE — Telephone Encounter (Signed)
Left message to advise patient we are switching her back to tablets and the prescription has been sent to the pharmacy.

## 2018-02-17 NOTE — Addendum Note (Signed)
Addended by: Carole Binning on: 02/17/2018 09:47 AM   Modules accepted: Orders

## 2018-02-23 ENCOUNTER — Encounter: Payer: Self-pay | Admitting: Rheumatology

## 2018-02-23 ENCOUNTER — Ambulatory Visit: Payer: BLUE CROSS/BLUE SHIELD | Admitting: Psychology

## 2018-02-23 DIAGNOSIS — F410 Panic disorder [episodic paroxysmal anxiety] without agoraphobia: Secondary | ICD-10-CM

## 2018-02-23 DIAGNOSIS — F331 Major depressive disorder, recurrent, moderate: Secondary | ICD-10-CM | POA: Diagnosis not present

## 2018-02-23 NOTE — Telephone Encounter (Signed)
Discussed with Dr. Estanislado Pandy.  Please notify patient that she will need to return for an additional lab test.  Please place order for Immunoglobulins.

## 2018-03-01 ENCOUNTER — Other Ambulatory Visit: Payer: Self-pay | Admitting: Neurological Surgery

## 2018-03-01 DIAGNOSIS — M4316 Spondylolisthesis, lumbar region: Secondary | ICD-10-CM

## 2018-03-02 ENCOUNTER — Ambulatory Visit (INDEPENDENT_AMBULATORY_CARE_PROVIDER_SITE_OTHER): Payer: BLUE CROSS/BLUE SHIELD | Admitting: Psychology

## 2018-03-02 DIAGNOSIS — F331 Major depressive disorder, recurrent, moderate: Secondary | ICD-10-CM

## 2018-03-02 DIAGNOSIS — F410 Panic disorder [episodic paroxysmal anxiety] without agoraphobia: Secondary | ICD-10-CM

## 2018-03-09 ENCOUNTER — Ambulatory Visit: Payer: BLUE CROSS/BLUE SHIELD | Admitting: Psychology

## 2018-03-16 ENCOUNTER — Ambulatory Visit: Payer: BLUE CROSS/BLUE SHIELD | Admitting: Psychology

## 2018-03-17 ENCOUNTER — Other Ambulatory Visit: Payer: Self-pay | Admitting: Primary Care

## 2018-03-23 ENCOUNTER — Ambulatory Visit: Payer: BLUE CROSS/BLUE SHIELD | Admitting: Psychology

## 2018-03-30 ENCOUNTER — Ambulatory Visit: Payer: BLUE CROSS/BLUE SHIELD | Admitting: Psychology

## 2018-04-06 ENCOUNTER — Ambulatory Visit: Payer: BLUE CROSS/BLUE SHIELD | Admitting: Psychology

## 2018-04-07 ENCOUNTER — Ambulatory Visit
Admission: RE | Admit: 2018-04-07 | Discharge: 2018-04-07 | Disposition: A | Discharge status: Home / Self Care | Payer: BLUE CROSS/BLUE SHIELD | Source: Ambulatory Visit | Attending: Neurological Surgery | Admitting: Neurological Surgery

## 2018-04-07 DIAGNOSIS — M5136 Other intervertebral disc degeneration, lumbar region: Secondary | ICD-10-CM | POA: Insufficient documentation

## 2018-04-07 DIAGNOSIS — M4316 Spondylolisthesis, lumbar region: Secondary | ICD-10-CM | POA: Insufficient documentation

## 2018-04-07 DIAGNOSIS — M5126 Other intervertebral disc displacement, lumbar region: Secondary | ICD-10-CM | POA: Diagnosis not present

## 2018-04-07 DIAGNOSIS — Z9889 Other specified postprocedural states: Secondary | ICD-10-CM | POA: Diagnosis not present

## 2018-04-07 LAB — POCT I-STAT CREATININE: CREATININE: 0.8 mg/dL (ref 0.44–1.00)

## 2018-04-07 MED ORDER — GADOBENATE DIMEGLUMINE 529 MG/ML IV SOLN
20.0000 mL | Freq: Once | INTRAVENOUS | Status: AC | PRN
  Administered 2018-04-07: 20 mL via INTRAVENOUS

## 2018-04-13 ENCOUNTER — Ambulatory Visit (INDEPENDENT_AMBULATORY_CARE_PROVIDER_SITE_OTHER): Payer: BLUE CROSS/BLUE SHIELD | Admitting: Psychology

## 2018-04-13 DIAGNOSIS — F410 Panic disorder [episodic paroxysmal anxiety] without agoraphobia: Secondary | ICD-10-CM

## 2018-04-13 DIAGNOSIS — F331 Major depressive disorder, recurrent, moderate: Secondary | ICD-10-CM

## 2018-04-15 ENCOUNTER — Other Ambulatory Visit: Payer: Self-pay | Admitting: Primary Care

## 2018-04-15 DIAGNOSIS — E119 Type 2 diabetes mellitus without complications: Secondary | ICD-10-CM

## 2018-04-15 DIAGNOSIS — E782 Mixed hyperlipidemia: Secondary | ICD-10-CM

## 2018-04-15 DIAGNOSIS — I10 Essential (primary) hypertension: Secondary | ICD-10-CM

## 2018-04-15 DIAGNOSIS — Z1159 Encounter for screening for other viral diseases: Secondary | ICD-10-CM

## 2018-04-20 ENCOUNTER — Other Ambulatory Visit: Payer: BLUE CROSS/BLUE SHIELD

## 2018-04-20 ENCOUNTER — Ambulatory Visit: Payer: BLUE CROSS/BLUE SHIELD | Admitting: Psychology

## 2018-04-20 ENCOUNTER — Other Ambulatory Visit (INDEPENDENT_AMBULATORY_CARE_PROVIDER_SITE_OTHER): Payer: BLUE CROSS/BLUE SHIELD

## 2018-04-20 DIAGNOSIS — E782 Mixed hyperlipidemia: Secondary | ICD-10-CM

## 2018-04-20 DIAGNOSIS — I10 Essential (primary) hypertension: Secondary | ICD-10-CM | POA: Diagnosis not present

## 2018-04-20 DIAGNOSIS — E119 Type 2 diabetes mellitus without complications: Secondary | ICD-10-CM

## 2018-04-20 DIAGNOSIS — Z1159 Encounter for screening for other viral diseases: Secondary | ICD-10-CM | POA: Diagnosis not present

## 2018-04-20 LAB — COMPREHENSIVE METABOLIC PANEL
ALT: 15 U/L (ref 0–35)
AST: 20 U/L (ref 0–37)
Albumin: 4.3 g/dL (ref 3.5–5.2)
Alkaline Phosphatase: 113 U/L (ref 39–117)
BILIRUBIN TOTAL: 0.6 mg/dL (ref 0.2–1.2)
BUN: 11 mg/dL (ref 6–23)
CO2: 29 meq/L (ref 19–32)
Calcium: 9.7 mg/dL (ref 8.4–10.5)
Chloride: 100 mEq/L (ref 96–112)
Creatinine, Ser: 0.84 mg/dL (ref 0.40–1.20)
GFR: 75.06 mL/min (ref >60.00)
GLUCOSE: 229 mg/dL — AB (ref 70–99)
Potassium: 4.2 mEq/L (ref 3.5–5.1)
SODIUM: 135 meq/L (ref 135–145)
TOTAL PROTEIN: 8.2 g/dL (ref 6.0–8.3)

## 2018-04-20 LAB — LIPID PANEL
Cholesterol: 180 mg/dL (ref 0–200)
HDL: 40.1 mg/dL (ref >39.00)
NONHDL: 139.74
Total CHOL/HDL Ratio: 4
Triglycerides: 201 mg/dL — ABNORMAL HIGH (ref 0.0–149.0)
VLDL: 40.2 mg/dL — AB (ref 0.0–40.0)

## 2018-04-20 LAB — HEMOGLOBIN A1C: HEMOGLOBIN A1C: 7.9 % — AB (ref 4.6–6.5)

## 2018-04-20 LAB — LDL CHOLESTEROL, DIRECT: LDL DIRECT: 109 mg/dL

## 2018-04-21 LAB — HEPATITIS C ANTIBODY
HEP C AB: NONREACTIVE
SIGNAL TO CUT-OFF: 0.02 (ref <1.00)

## 2018-04-25 ENCOUNTER — Ambulatory Visit (INDEPENDENT_AMBULATORY_CARE_PROVIDER_SITE_OTHER): Payer: BLUE CROSS/BLUE SHIELD | Admitting: Primary Care

## 2018-04-25 ENCOUNTER — Encounter: Payer: Self-pay | Admitting: Primary Care

## 2018-04-25 VITALS — BP 144/90 | HR 92 | Temp 98.0°F | Ht 66.0 in | Wt 225.8 lb

## 2018-04-25 DIAGNOSIS — M545 Low back pain: Secondary | ICD-10-CM | POA: Diagnosis not present

## 2018-04-25 DIAGNOSIS — M170 Bilateral primary osteoarthritis of knee: Secondary | ICD-10-CM

## 2018-04-25 DIAGNOSIS — M3219 Other organ or system involvement in systemic lupus erythematosus: Secondary | ICD-10-CM

## 2018-04-25 DIAGNOSIS — Z1239 Encounter for other screening for malignant neoplasm of breast: Secondary | ICD-10-CM

## 2018-04-25 DIAGNOSIS — F329 Major depressive disorder, single episode, unspecified: Secondary | ICD-10-CM

## 2018-04-25 DIAGNOSIS — Z1231 Encounter for screening mammogram for malignant neoplasm of breast: Secondary | ICD-10-CM | POA: Diagnosis not present

## 2018-04-25 DIAGNOSIS — G8929 Other chronic pain: Secondary | ICD-10-CM | POA: Diagnosis not present

## 2018-04-25 DIAGNOSIS — E119 Type 2 diabetes mellitus without complications: Secondary | ICD-10-CM

## 2018-04-25 DIAGNOSIS — Z0000 Encounter for general adult medical examination without abnormal findings: Secondary | ICD-10-CM | POA: Diagnosis not present

## 2018-04-25 DIAGNOSIS — F419 Anxiety disorder, unspecified: Secondary | ICD-10-CM

## 2018-04-25 DIAGNOSIS — E785 Hyperlipidemia, unspecified: Secondary | ICD-10-CM

## 2018-04-25 DIAGNOSIS — F32A Depression, unspecified: Secondary | ICD-10-CM

## 2018-04-25 DIAGNOSIS — I10 Essential (primary) hypertension: Secondary | ICD-10-CM | POA: Diagnosis not present

## 2018-04-25 DIAGNOSIS — M3509 Sicca syndrome with other organ involvement: Secondary | ICD-10-CM | POA: Diagnosis not present

## 2018-04-25 MED ORDER — METFORMIN HCL ER 500 MG PO TB24: ORAL_TABLET | ORAL | 3 refills | Status: DC

## 2018-04-25 MED ORDER — BUPROPION HCL ER (XL) 150 MG PO TB24: ORAL_TABLET | ORAL | 3 refills | Status: DC

## 2018-04-25 MED ORDER — CLONIDINE HCL 0.1 MG PO TABS: ORAL_TABLET | ORAL | 3 refills | Status: DC

## 2018-04-25 MED ORDER — VALSARTAN-HYDROCHLOROTHIAZIDE 320-25 MG PO TABS: ORAL_TABLET | ORAL | 3 refills | Status: DC

## 2018-04-25 MED ORDER — AMLODIPINE BESYLATE 10 MG PO TABS
ORAL_TABLET | ORAL | 3 refills | Status: DC
Start: 2018-04-25 — End: 2019-07-10

## 2018-04-25 MED ORDER — ROSUVASTATIN CALCIUM 5 MG PO TABS: ORAL_TABLET | ORAL | 3 refills | Status: DC

## 2018-04-25 MED ORDER — PEN NEEDLES 31G X 6 MM MISC: 5 refills | Status: DC

## 2018-04-25 MED ORDER — METOPROLOL TARTRATE 100 MG PO TABS: ORAL_TABLET | ORAL | 3 refills | Status: DC

## 2018-04-25 MED ORDER — INSULIN GLARGINE 100 UNIT/ML SOLOSTAR PEN: 8.0000 [IU] | PEN_INJECTOR | Freq: Every evening | SUBCUTANEOUS | 5 refills | Status: DC

## 2018-04-25 NOTE — Assessment & Plan Note (Addendum)
Checking glucose intermittently AM fasting which are running 160-170. Metformin causing GI upset, will switch to enteric coated version.   She's tired of taking pills and would like to try injectable medications. Stop Glipizide. Start Lantus 8 units HS. Discussed to start checking blood glucose levels twice daily. She will report readings below 90 or above 200. Repeat A1C in 3 months.  Foot exam today. Pneumonia vaccination UTD. Initiated statin therapy given ASCVD risk score of 17%. Managed on ARB.  Discussed to schedule annual eye exam. Follow up in 3 months.

## 2018-04-25 NOTE — Assessment & Plan Note (Signed)
Immunizations UTD. Mammogram due, orders placed. Colon Cancer screening over due, re-applied her for Cologuard. Hep C screen is negative. Discussed the importance of a healthy diet and regular exercise in order for weight loss, and to reduce the risk of any potential medical problems. Exam stable. Labs with hyperlipidemia and increased A1C. Follow up in 1 year for CPE.

## 2018-04-25 NOTE — Assessment & Plan Note (Signed)
Following with rheumatology and dermatology. Continue current regimen.

## 2018-04-25 NOTE — Assessment & Plan Note (Signed)
Will be undergoing another surgery in late May 2019. Following with neurosurgery.

## 2018-04-25 NOTE — Assessment & Plan Note (Signed)
Above goal in the office today, she's not checking BP at home. Suspect the majority of her high blood pressure readings are secondary to chronic pain. She will be undergoing surgery later in May 2019. Discussed to start monitoring home readings and report readings at or above 135/90.

## 2018-04-25 NOTE — Assessment & Plan Note (Signed)
Above goal with ASCVD risk score of 17%. History of diabetes and hypertension. Rx for Crestor 5 mg sent to pharmacy. Repeat lipids at next visit.

## 2018-04-25 NOTE — Patient Instructions (Addendum)
We changed your Metformin. Take 2 tablets every morning with breakfast for diabetes.  We changed your Amlodipine to 10 mg once daily.   Start rosuvastatin 5 mg tablets for cholesterol. Take 1 tablet every evening.  Start Lantus and inject 8 units into the skin every evening.   Start checking your blood sugars every morning before breakfast and 2 hours after dinner. Record your readings. Please notify me if you get consistent readings below 90 or above 200.  Stop glipizide.  Purchase a blood pressure cuff and start monitoring your blood pressure at home. Check once daily, use the same arm, make sure you've rested.   Complete the Cologuard Kit as discussed.  Call and schedule your mammogram when ready.   Please schedule a follow up appointment in 3 months.  It was a pleasure to see you today!

## 2018-04-25 NOTE — Assessment & Plan Note (Signed)
Stable on Wellbutrin, continue same.

## 2018-04-25 NOTE — Assessment & Plan Note (Signed)
Following with Rheumatology, undergoing another round of injections.

## 2018-04-25 NOTE — Assessment & Plan Note (Signed)
Doing well on Wellbutrin, continue same.

## 2018-04-25 NOTE — Assessment & Plan Note (Signed)
Following with rheumatology. Continue current regimen.  

## 2018-04-25 NOTE — Progress Notes (Signed)
Subjective:    Patient ID: Lauren Winters, female    DOB: 1966-03-17, 52 y.o.   MRN: [id number]  HPI  [name] is a 52 year old female who presents today for complete physical and medication refill. She would like to limit the amount of pills she's taking daily.   Immunizations: -Tetanus: Completed in 2017 -Influenza: Completed last season -Pneumonia: Completed Pneumovax in 2015 -Shingles: Completed in 2015  Diet: She endorses a fair diet. Breakfast: Toast, mostly skips Lunch: Sandwich Dinner: Sandwich  Snacks: Chips, pop tarts  Desserts: 2-3 times weekly Beverages: Diet Coke, no water, milk   Exercise: She is not exercising.  Eye exam: Completed in 2017, she plans on scheduling  Dental exam: No recent exam Colonoscopy: Never completed, did not complete Cologuard Kit from last year.  Dexa: Completed in 2018, normal Pap Smear: Hysterectomy  Mammogram: Completed in May 2018 Hep C Screen: Negative in 2019  BP Readings from Last 3 Encounters:  04/25/18 (!) 144/90  02/14/18 (!) 150/93  01/16/18 140/80   The 10-year ASCVD risk score [name], et al., 2013) is: 17.1%   Values used to calculate the score:     Age: 27 years     Sex: Female     Is Non-Hispanic African American: No     Diabetic: Yes     Tobacco smoker: Yes     Systolic Blood Pressure: 088 mmHg     Is BP treated: Yes     HDL Cholesterol: 40.1 mg/dL     Total Cholesterol: 180 mg/dL   Review of Systems  Constitutional: Negative for unexpected weight change.  HENT: Negative for rhinorrhea.   Eyes: Negative for visual disturbance.  Respiratory: Negative for cough and shortness of breath.   Cardiovascular: Negative for chest pain.  Gastrointestinal: Negative for constipation and diarrhea.  Genitourinary: Negative for difficulty urinating.  Musculoskeletal: Positive for arthralgias, back pain and myalgias.  Skin: Negative for color change.  Allergic/Immunologic: Negative for environmental  allergies.  Neurological: Positive for numbness. Negative for dizziness and headaches.  Psychiatric/Behavioral:       Overall feels well managed on Wellbutrin       Past Medical History:  Diagnosis Date  . Anxiety   . Atypical chest pain    a. ETT 05/2014: normal stress test without evidence of myocardial ischemia or chest pain at peak stress  . Complication of anesthesia    migraine headache  . Depression   . Diabetes mellitus without complication ([place]) metformin   dx 2015  . Dyspnea    on exertion  . Fibromyalgia   . Headache   . Heart murmur    told back in 2007, but no mention with 2016 cardiac w/u  . Hypertension   . Lupus (systemic lupus erythematosus) ([place])   . Mitral regurgitation    a. 05/2014 echo: EF >55%, nl LV/RV sys fxn, mild MR/AR, no valvular stenosis   . Obesity (BMI 30-39.9)   . Raynaud's phenomenon   . Rheumatoid arthritis ([place])   . Sjogren's disease ([place])   . Tobacco abuse    a. ongoing; b. ongoing SOB     Social History   Socioeconomic History  . Marital status: Married    Spouse name: Not on file  . Number of children: Not on file  . Years of education: Not on file  . Highest education level: Not on file  Occupational History  . Not on file  Social Needs  . Emergency planning/management officer  strain: Not on file  . Food insecurity:    Worry: Not on file    Inability: Not on file  . Transportation needs:    Medical: Not on file    Non-medical: Not on file  Tobacco Use  . Smoking status: Former Smoker    Packs/day: 0.50    Years: 10.00    Pack years: 5.00    Types: Cigarettes    Last attempt to quit: 07/22/2015    Years since quitting: 2.7  . Smokeless tobacco: Never Used  Substance and Sexual Activity  . Alcohol use: No    Alcohol/week: 0.0 oz  . Drug use: No  . Sexual activity: Not on file  Lifestyle  . Physical activity:    Days per week: Not on file    Minutes per session: Not on file  . Stress: Not on file  Relationships  . Social  connections:    Talks on phone: Not on file    Gets together: Not on file    Attends religious service: Not on file    Active member of club or organization: Not on file    Attends meetings of clubs or organizations: Not on file    Relationship status: Not on file  . Intimate partner violence:    Fear of current or ex partner: Not on file    Emotionally abused: Not on file    Physically abused: Not on file    Forced sexual activity: Not on file  Other Topics Concern  . Not on file  Social History Narrative   Married.   3 children.   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     Past Surgical History:  Procedure Laterality Date  . ABDOMINAL HYSTERECTOMY    . BACK SURGERY    . CESAREAN SECTION     x3  . LUMBAR LAMINECTOMY    . LUMBAR LAMINECTOMY/DECOMPRESSION MICRODISCECTOMY N/A 03/29/2017   Procedure: Re operative Right Lumbar Three-Four Laminectomy and discectomy with Left Lumbar Three-Four, Bilateral Lumbar Four-Five Laminectomy for decompression;  Surgeon: Kevan Ny Ditty, MD;  Location: [facility];  Service: Neurosurgery;  Laterality: N/A;  . SPINAL CORD STIMULATOR INSERTION  12/09/2017   Procedure: LUMBAR SPINAL CORD STIMULATOR IMPLANT;  Surgeon: Eustace Moore, MD;  Location: [facility];  Service: Neurosurgery;;  . SPINAL CORD STIMULATOR REMOVAL N/A 01/11/2018   Procedure: LUMBAR SPINAL CORD STIMULATOR REMOVAL;  Surgeon: Eustace Moore, MD;  Location: [facility];  Service: Neurosurgery;  Laterality: N/A;  . TUBAL LIGATION      Family History  Problem Relation Age of Onset  . Hypertension Mother   . Valvular heart disease Mother   . CAD Father 37       CABG x 4  . Liver cancer Father 36       passed  . CAD Paternal Aunt        CABG  . Breast cancer Paternal Aunt   . CAD Paternal Aunt        CABG  . Breast cancer Paternal Aunt   . CAD Paternal Aunt        CABG  . Breast cancer Paternal Aunt   . CAD Paternal Aunt        CABG  . Breast  cancer Paternal Aunt   . CAD Paternal Aunt        CABG  . Breast cancer Paternal Aunt   . CAD Paternal Uncle  MI s/p CABG  . Stroke Maternal Grandmother   . Stroke Maternal Grandfather   . CAD Maternal Grandfather     Allergies  Allergen Reactions  . Pseudoephedrine Hcl Shortness Of Breath and Other (See Comments)    Room starts spinning, can't breath  . Clindamycin/Lincomycin Hives  . Ancef [Cefazolin]   . Prednisone Other (See Comments)    Chest pain w/ cortisone shots or prednisone pills Elevated blood pressure  . Adhesive [Tape] Rash and Other (See Comments)    Sensitivity to certain band aids and adhesives - burns skin, makes skin raw  . Other Rash and Other (See Comments)    Sensitivity to certain band aids and adhesives - burns skin, makes skin raw  . Sudafed [Pseudoephedrine Hcl] Other (See Comments)    Dizziness     Current Outpatient Medications on File Prior to Visit  Medication Sig Dispense Refill  . clobetasol cream (TEMOVATE) 1.30 % Apply 1 application topically 2 (two) times daily as needed (for lupis rash).    . cyclobenzaprine (FLEXERIL) 10 MG tablet Take 1 tablet (10 mg total) by mouth 3 (three) times daily as needed for muscle spasms. 30 tablet 0  . diclofenac (VOLTAREN) 75 MG EC tablet Take 75 mg by mouth 3 (three) times daily.   0  . doxycycline (ADOXA) 100 MG tablet Take 100 mg by mouth 2 (two) times daily.  0  . escitalopram (LEXAPRO) 20 MG tablet Take 20 mg by mouth daily.    . folic acid (FOLVITE) 1 MG tablet Take 2 tablets (2 mg total) by mouth daily. 180 tablet 4  . gabapentin (NEURONTIN) 300 MG capsule Take 600 mg by mouth 3 (three) times daily.   0  . Halcinonide 0.1 % OINT Apply 1 application topically 2 (two) times daily as needed (for lupis rash).     Marland Kitchen HYDROcodone-acetaminophen (NORCO) 7.5-325 MG tablet Take 1 tablet by mouth every 4 (four) hours as needed for moderate pain. 30 tablet 0  . HYDROcodone-acetaminophen (NORCO) 7.5-325 MG  tablet Take 1 tablet by mouth every 4 (four) hours as needed for moderate pain. 30 tablet 0  . hydroxychloroquine (PLAQUENIL) 200 MG tablet Take 1 tablet (200 mg total) by mouth 2 (two) times daily. 60 tablet 0  . hydrOXYzine (ATARAX/VISTARIL) 25 MG tablet Take 1 tablet (25 mg total) by mouth 2 (two) times daily as needed for anxiety. 30 tablet 0  . magic mouthwash w/lidocaine SOLN diphenhydramine/ 2%  VISCOUS lidocaine /antacid-MAALOX (1:1:1); 15 mL swished in the mouth for 30 sec, then spit out.    May be used every 3 to 4 h 120 mL 2  . methotrexate (RHEUMATREX) 2.5 MG tablet Take 8 tablets (20 mg total) by mouth once a week. Caution:Chemotherapy. Protect from light. 96 tablet 0  . methotrexate 50 MG/2ML injection Inject 20 mg into the muscle every Tuesday.     . pilocarpine (SALAGEN) 5 MG tablet Take 5 mg by mouth daily as needed (for dry mouth).     . tacrolimus (PROTOPIC) 0.1 % ointment Apply 1 application topically 2 (two) times daily as needed (for lupis rash).     . Triamcinolone Acetonide (TRIAMCINOLONE 0.1 % CREAM : EUCERIN) CREA Apply 1 application topically 2 (two) times daily as needed for rash or irritation.     . Vitamin D, Ergocalciferol, (DRISDOL) 50000 units CAPS capsule Take 1 capsule (50,000 Units total) by mouth 2 (two) times a week. 24 capsule 0   No current facility-administered medications on  file prior to visit.     BP (!) 144/90   Pulse 92   Temp 98 F (36.7 C) (Oral)   Ht 5' 6"  (1.676 m)   Wt 225 lb 12.8 oz (102.4 kg)   SpO2 98%   BMI 36.45 kg/m    Objective:   Physical Exam  Constitutional: She is oriented to person, place, and time. She appears well-nourished.  HENT:  Right Ear: Tympanic membrane and ear canal normal.  Left Ear: Tympanic membrane and ear canal normal.  Nose: Nose normal.  Mouth/Throat: Oropharynx is clear and moist.  Eyes: Pupils are equal, round, and reactive to light. Conjunctivae and EOM are normal.  Neck: Neck supple. No thyromegaly  present.  Cardiovascular: Normal rate and regular rhythm.  No murmur heard. Pulmonary/Chest: Effort normal and breath sounds normal. She has no rales.  Abdominal: Soft. Bowel sounds are normal. There is no tenderness.  Musculoskeletal:  Generalized decrease in ROM to lower back and knees. Generalized weakness to all extremities.   Lymphadenopathy:    She has no cervical adenopathy.  Neurological: She is alert and oriented to person, place, and time. She has normal reflexes. No cranial nerve deficit.  Skin: Skin is warm and dry. No rash noted.  Psychiatric: She has a normal mood and affect.          Assessment & Plan:

## 2018-04-26 ENCOUNTER — Other Ambulatory Visit: Payer: Self-pay | Admitting: Neurological Surgery

## 2018-04-27 ENCOUNTER — Ambulatory Visit: Payer: BLUE CROSS/BLUE SHIELD | Admitting: Psychology

## 2018-05-04 ENCOUNTER — Ambulatory Visit: Payer: BLUE CROSS/BLUE SHIELD | Admitting: Psychology

## 2018-05-06 ENCOUNTER — Other Ambulatory Visit: Payer: Self-pay | Admitting: Rheumatology

## 2018-05-06 DIAGNOSIS — E559 Vitamin D deficiency, unspecified: Secondary | ICD-10-CM

## 2018-05-08 NOTE — Telephone Encounter (Signed)
Left message to advise patient will need labs update before prescription can be refilled.

## 2018-05-11 ENCOUNTER — Ambulatory Visit: Payer: BLUE CROSS/BLUE SHIELD | Admitting: Psychology

## 2018-05-16 NOTE — Pre-Procedure Instructions (Signed)
Lauren Winters  05/16/2018      [id number] - [place], [place] - [address] [place] [address] Phone: [phone] Fax: [phone]    Your procedure is scheduled on Fri., May 26, 2018 from 9:30AM-1:37PM  Report to [facility] Admitting Entrance "A" at 7:30AM  Call this number if you have problems the morning of surgery:  [phone]   Remember:  No food after midnight on May 30th  Take these medicines the morning of surgery with A SIP OF WATER: AmLODipine (NORVASC), BuPROPion (WELLBUTRIN XL), CloNIDine (CATAPRES), Escitalopram (LEXAPRO), Metoprolol tartrate (LOPRESSOR), and Hydroxychloroquine (PLAQUENIL). If needed HYDROcodone-acetaminophen [facility]) for pain and Cyclobenzaprine (FLEXERIL) for spasms.  7 days before surgery (5/24), stop taking all Other Aspirin Products, Vitamins, Fish oils, and Herbal medications. Also stop all NSAIDS i.e. Advil, Ibuprofen, Motrin, Aleve, Anaprox, Naproxen, BC and Goody Powders.  How to Manage Your Diabetes Before and After Surgery  Why is it important to control my blood sugar before and after surgery? . Improving blood sugar levels before and after surgery helps healing and can limit problems. . A way of improving blood sugar control is eating a healthy diet by: o  Eating less sugar and carbohydrates o  Increasing activity/exercise o  Talking with your doctor about reaching your blood sugar goals . High blood sugars (greater than 180 mg/dL) can raise your risk of infections and slow your recovery, so you will need to focus on controlling your diabetes during the weeks before surgery. . Make sure that the doctor who takes care of your diabetes knows about your planned surgery including the date and location.  How do I manage my blood sugar before surgery? . Check your blood sugar at least 4 times a day, starting 2 days before surgery, to make sure that the level is not too high or low. o Check your blood sugar  the morning of your surgery when you wake up and every 2 hours until you get to the Short Stay unit. . If your blood sugar is less than 70 mg/dL, you will need to treat for low blood sugar: o Do not take insulin. o Treat a low blood sugar (less than 70 mg/dL) with  cup of clear juice (cranberry or apple), 4 glucose tablets, OR glucose gel. Recheck blood sugar in 15 minutes after treatment (to make sure it is greater than 70 mg/dL). If your blood sugar is not greater than 70 mg/dL on recheck, call [phone] o  for further instructions. . Report your blood sugar to the short stay nurse when you get to Short Stay.  . If you are admitted to the hospital after surgery: o Your blood sugar will be checked by the staff and you will probably be given insulin after surgery (instead of oral diabetes medicines) to make sure you have good blood sugar levels. o The goal for blood sugar control after surgery is 80-180 mg/dL.  WHAT DO I DO ABOUT MY DIABETES MEDICATION?  Marland Kitchen Do not take MetFORMIN (GLUCOPHAGE-XR) the morning of surgery.  . THE NIGHT BEFORE SURGERY, take ______4_____ units of ____Lantus_______insulin.    . If your CBG is greater than 220 mg/dL, call us at [phone]  Reviewed and Endorsed by [facility] Patient Education Committee, August 2015    Do not wear jewelry, make-up or nail polish.  Do not wear lotions, powders, or perfumes, or deodorant.  Do not shave 48 hours prior to surgery.  Do not bring valuables to the hospital.  [facility] is not responsible for any belongings or valuables.  Contacts, dentures or bridgework may not be worn into surgery.  Leave your suitcase in the car.  After surgery it may be brought to your room.  For patients admitted to the hospital, discharge time will be determined by your treatment team.  Patients discharged the day of surgery will not be allowed to drive home.   Special instructions:   [facility]- Preparing For Surgery  Before  surgery, you can play an important role. Because skin is not sterile, your skin needs to be as free of germs as possible. You can reduce the number of germs on your skin by washing with CHG (chlorahexidine gluconate) Soap before surgery.  CHG is an antiseptic cleaner which kills germs and bonds with the skin to continue killing germs even after washing.    Oral Hygiene is also important to reduce your risk of infection.  Remember - BRUSH YOUR TEETH THE MORNING OF SURGERY WITH YOUR REGULAR TOOTHPASTE  Please do not use if you have an allergy to CHG or antibacterial soaps. If your skin becomes reddened/irritated stop using the CHG.  Do not shave (including legs and underarms) for at least 48 hours prior to first CHG shower. It is OK to shave your face.  Please follow these instructions carefully.   1. Shower the NIGHT BEFORE SURGERY and the MORNING OF SURGERY with CHG.   2. If you chose to wash your hair, wash your hair first as usual with your normal shampoo.  3. After you shampoo, rinse your hair and body thoroughly to remove the shampoo.  4. Use CHG as you would any other liquid soap. You can apply CHG directly to the skin and wash gently with a scrungie or a clean washcloth.   5. Apply the CHG Soap to your body ONLY FROM THE NECK DOWN.  Do not use on open wounds or open sores. Avoid contact with your eyes, ears, mouth and genitals (private parts). Wash Face and genitals (private parts)  with your normal soap.  6. Wash thoroughly, paying special attention to the area where your surgery will be performed.  7. Thoroughly rinse your body with warm water from the neck down.  8. DO NOT shower/wash with your normal soap after using and rinsing off the CHG Soap.  9. Pat yourself dry with a CLEAN TOWEL.  10. Wear CLEAN PAJAMAS to bed the night before surgery, wear comfortable clothes the morning of surgery  11. Place CLEAN SHEETS on your bed the night of your first shower and DO NOT SLEEP WITH  PETS.  Day of Surgery:  Do not apply any deodorants/lotions.  Please wear clean clothes to the hospital/surgery center.   Remember to brush your teeth WITH YOUR REGULAR TOOTHPASTE.  Please read over the following fact sheets that you were given. Pain Booklet, Coughing and Deep Breathing, MRSA Information and Surgical Site Infection Prevention

## 2018-05-17 ENCOUNTER — Encounter (HOSPITAL_COMMUNITY): Payer: Self-pay

## 2018-05-17 ENCOUNTER — Encounter (HOSPITAL_COMMUNITY)
Admission: RE | Admit: 2018-05-17 | Discharge: 2018-05-17 | Disposition: A | Discharge status: Home / Self Care | Payer: BLUE CROSS/BLUE SHIELD | Source: Ambulatory Visit | Attending: Neurological Surgery | Admitting: Neurological Surgery

## 2018-05-17 ENCOUNTER — Other Ambulatory Visit: Payer: Self-pay

## 2018-05-17 DIAGNOSIS — E119 Type 2 diabetes mellitus without complications: Secondary | ICD-10-CM | POA: Insufficient documentation

## 2018-05-17 DIAGNOSIS — Z01818 Encounter for other preprocedural examination: Secondary | ICD-10-CM | POA: Insufficient documentation

## 2018-05-17 DIAGNOSIS — F329 Major depressive disorder, single episode, unspecified: Secondary | ICD-10-CM | POA: Insufficient documentation

## 2018-05-17 DIAGNOSIS — M069 Rheumatoid arthritis, unspecified: Secondary | ICD-10-CM | POA: Insufficient documentation

## 2018-05-17 DIAGNOSIS — Z01812 Encounter for preprocedural laboratory examination: Secondary | ICD-10-CM | POA: Insufficient documentation

## 2018-05-17 DIAGNOSIS — Z90710 Acquired absence of both cervix and uterus: Secondary | ICD-10-CM | POA: Diagnosis not present

## 2018-05-17 DIAGNOSIS — Z0183 Encounter for blood typing: Secondary | ICD-10-CM | POA: Diagnosis not present

## 2018-05-17 DIAGNOSIS — F419 Anxiety disorder, unspecified: Secondary | ICD-10-CM | POA: Insufficient documentation

## 2018-05-17 DIAGNOSIS — Z79899 Other long term (current) drug therapy: Secondary | ICD-10-CM | POA: Diagnosis not present

## 2018-05-17 DIAGNOSIS — I10 Essential (primary) hypertension: Secondary | ICD-10-CM | POA: Insufficient documentation

## 2018-05-17 DIAGNOSIS — Z794 Long term (current) use of insulin: Secondary | ICD-10-CM | POA: Insufficient documentation

## 2018-05-17 DIAGNOSIS — I7300 Raynaud's syndrome without gangrene: Secondary | ICD-10-CM | POA: Insufficient documentation

## 2018-05-17 DIAGNOSIS — Z6835 Body mass index (BMI) 35.0-35.9, adult: Secondary | ICD-10-CM | POA: Insufficient documentation

## 2018-05-17 DIAGNOSIS — M797 Fibromyalgia: Secondary | ICD-10-CM | POA: Insufficient documentation

## 2018-05-17 DIAGNOSIS — M3500 Sicca syndrome, unspecified: Secondary | ICD-10-CM | POA: Insufficient documentation

## 2018-05-17 DIAGNOSIS — M329 Systemic lupus erythematosus, unspecified: Secondary | ICD-10-CM | POA: Insufficient documentation

## 2018-05-17 DIAGNOSIS — E669 Obesity, unspecified: Secondary | ICD-10-CM | POA: Diagnosis not present

## 2018-05-17 DIAGNOSIS — M4316 Spondylolisthesis, lumbar region: Secondary | ICD-10-CM | POA: Insufficient documentation

## 2018-05-17 HISTORY — DX: Spondylolisthesis, lumbar region: M43.16

## 2018-05-17 LAB — CBC WITH DIFFERENTIAL/PLATELET
Abs Immature Granulocytes: 0 10*3/uL (ref 0.0–0.1)
BASOS PCT: 1 %
Basophils Absolute: 0.1 10*3/uL (ref 0.0–0.1)
EOS ABS: 0.1 10*3/uL (ref 0.0–0.7)
EOS PCT: 2 %
HEMATOCRIT: 42.9 % (ref 36.0–46.0)
Hemoglobin: 14 g/dL (ref 12.0–15.0)
IMMATURE GRANULOCYTES: 0 %
LYMPHS ABS: 1.5 10*3/uL (ref 0.7–4.0)
Lymphocytes Relative: 26 %
MCH: 25.8 pg — ABNORMAL LOW (ref 26.0–34.0)
MCHC: 32.6 g/dL (ref 30.0–36.0)
MCV: 79 fL (ref 78.0–100.0)
Monocytes Absolute: 0.5 10*3/uL (ref 0.1–1.0)
Monocytes Relative: 8 %
NEUTROS PCT: 63 %
Neutro Abs: 3.8 10*3/uL (ref 1.7–7.7)
PLATELETS: 359 10*3/uL (ref 150–400)
RBC: 5.43 MIL/uL — AB (ref 3.87–5.11)
RDW: 13.5 % (ref 11.5–15.5)
WBC: 6 10*3/uL (ref 4.0–10.5)

## 2018-05-17 LAB — TYPE AND SCREEN
ABO/RH(D): A POS
ANTIBODY SCREEN: NEGATIVE

## 2018-05-17 LAB — BASIC METABOLIC PANEL
Anion gap: 11 (ref 5–15)
BUN: 16 mg/dL (ref 6–20)
CALCIUM: 9.8 mg/dL (ref 8.9–10.3)
CHLORIDE: 99 mmol/L — AB (ref 101–111)
CO2: 28 mmol/L (ref 22–32)
CREATININE: 1.2 mg/dL — AB (ref 0.44–1.00)
GFR calc non Af Amer: 50 mL/min — ABNORMAL LOW (ref >60)
GFR, EST AFRICAN AMERICAN: 58 mL/min — AB (ref >60)
Glucose, Bld: 164 mg/dL — ABNORMAL HIGH (ref 65–99)
Potassium: 3.2 mmol/L — ABNORMAL LOW (ref 3.5–5.1)
SODIUM: 138 mmol/L (ref 135–145)

## 2018-05-17 LAB — SURGICAL PCR SCREEN
MRSA, PCR: NEGATIVE
STAPHYLOCOCCUS AUREUS: NEGATIVE

## 2018-05-17 LAB — HEMOGLOBIN A1C
Hgb A1c MFr Bld: 8.2 % — ABNORMAL HIGH (ref 4.8–5.6)
MEAN PLASMA GLUCOSE: 188.64 mg/dL

## 2018-05-17 LAB — PROTIME-INR
INR: 0.99
Prothrombin Time: 13 seconds (ref 11.4–15.2)

## 2018-05-17 LAB — GLUCOSE, CAPILLARY: GLUCOSE-CAPILLARY: 192 mg/dL — AB (ref 65–99)

## 2018-05-17 LAB — ABO/RH: ABO/RH(D): A POS

## 2018-05-17 NOTE — Progress Notes (Signed)
[facility]  Cardiologist - Dr. Jacinto Reap. [facility]  Chest x-ray - 01/11/18 (E)  EKG - 01/11/18 (E)  Stress Test - 08/08/17 (CE)  ECHO - 05/03/17 (E)  Cardiac Cath - Denies  Sleep Study - Denies CPAP - None  LABS- 05/17/18: CBC w/D, BMP, PT, T/S  ASA- Denies  HA1C- 05/17/18 Fasting Blood Sugar - 148-152, Today 192 Checks Blood Sugar __2___ times a day  Anesthesia- No  Pt denies having chest pain, sob, or fever at this time. All instructions explained to the pt, with a verbal understanding of the material. Pt agrees to go over the instructions while at home for a better understanding. The opportunity to ask questions was provided.

## 2018-05-18 ENCOUNTER — Ambulatory Visit: Payer: BLUE CROSS/BLUE SHIELD | Admitting: Psychology

## 2018-05-18 NOTE — Progress Notes (Signed)
Anesthesia Chart Review:   Case:  [id number] Date/Time:  05/26/18 0915   Procedure:  Posterior Lateral Fusion - L3-L4 - L4-L5 with segmental pedicle screw fixation (N/A Back)   Anesthesia type:  General   Pre-op diagnosis:  Spondylolisthesis   Location:  [facility] OR ROOM 18 / [place] [facility]   Surgeon:  Eustace Moore, MD      DISCUSSION:  - Pt is a 52 year old female with hx HTN, heart murmur, DM,lupus, RA, Raynaud's, Sjogren's.    VS: BP (!) 144/79   Pulse 95   Temp 36.9 C   Resp 20   Ht 5\' 6"  (1.676 m)   Wt 220 lb 3.2 oz (99.9 kg)   SpO2 100%   BMI 35.54 kg/m    PROVIDERS: PCP is Pleas Koch, NP   Patient Care Team: Bo Merino, MD as Consulting Physician (Rheumatology)   - Saw cardiologist Serafina Royals, MD 07/2017 for SOB, pre-op eval (pt was considering hip replacment). Nuclear stress test ordered, results below; pt was cleared for surgery (which does not seem to have happened). (Notes in care everywhere)    LABS: Labs reviewed: Acceptable for surgery. (all labs ordered are listed, but only abnormal results are displayed)  Labs Reviewed  GLUCOSE, CAPILLARY - Abnormal; Notable for the following components:      Result Value   Glucose-Capillary 192 (*)    All other components within normal limits  BASIC METABOLIC PANEL - Abnormal; Notable for the following components:   Potassium 3.2 (*)    Chloride 99 (*)    Glucose, Bld 164 (*)    Creatinine, Ser 1.20 (*)    GFR calc non Af Amer 50 (*)    GFR calc Af Amer 58 (*)    All other components within normal limits  CBC WITH DIFFERENTIAL/PLATELET - Abnormal; Notable for the following components:   RBC 5.43 (*)    MCH 25.8 (*)    All other components within normal limits  HEMOGLOBIN A1C - Abnormal; Notable for the following components:   Hgb A1c MFr Bld 8.2 (*)    All other components within normal limits  SURGICAL PCR SCREEN  PROTIME-INR  TYPE AND SCREEN  ABO/RH     IMAGES:  CXR 01/11/18: No active  cardiopulmonary disease  EKG 01/11/18: NSR. Cannot rule out Anterior infarct, age undetermined. Appears stable when compared to EKG 03/21/17   CV:  Nuclear stress test 08/08/17 (care everywhere):  - Normal Lexiscan infusion EKG - Normal myocardial perfusion without evidence of myocardial ischemia. EF 63%.   Echo 07/31/15: - Left ventricle: The cavity size was normal. Systolic function wasnormal. The estimated ejection fraction was in the range of 60%to 65%. Wall motion was normal; there were no regional wallmotion abnormalities. Doppler parameters are consistent withabnormal left ventricular relaxation (grade 1 diastolicdysfunction). - Aortic valve: There was mild regurgitation. - Mitral valve: There was mild regurgitation. - Left atrium: The atrium was mildly dilated. - Right ventricle: Systolic function was normal. - Pulmonary arteries: Systolic pressure was within the normalrange.    Past Medical History:  Diagnosis Date  . Anxiety   . Atypical chest pain    a. ETT 05/2014: normal stress test without evidence of myocardial ischemia or chest pain at peak stress  . Complication of anesthesia    migraine headache  . Depression   . Diabetes mellitus without complication ([place]) metformin   dx 2015  . Dyspnea    on exertion  . Fibromyalgia   .  Headache   . Heart murmur    told back in 2007, but no mention with 2016 cardiac w/u  . Hypertension   . Lupus (systemic lupus erythematosus) ([place])   . Mitral regurgitation    a. 05/2014 echo: EF >55%, nl LV/RV sys fxn, mild MR/AR, no valvular stenosis   . Obesity (BMI 30-39.9)   . Raynaud's phenomenon   . Rheumatoid arthritis ([place])   . Sjogren's disease ([place])   . Spondylolisthesis of lumbar region   . Tobacco abuse    a. ongoing; b. ongoing SOB    Past Surgical History:  Procedure Laterality Date  . ABDOMINAL HYSTERECTOMY    . BACK SURGERY    . CESAREAN SECTION     x3  . LUMBAR LAMINECTOMY    . LUMBAR  LAMINECTOMY/DECOMPRESSION MICRODISCECTOMY N/A 03/29/2017   Procedure: Re operative Right Lumbar Three-Four Laminectomy and discectomy with Left Lumbar Three-Four, Bilateral Lumbar Four-Five Laminectomy for decompression;  Surgeon: Kevan Ny Ditty, MD;  Location: [facility];  Service: Neurosurgery;  Laterality: N/A;  . SPINAL CORD STIMULATOR INSERTION  12/09/2017   Procedure: LUMBAR SPINAL CORD STIMULATOR IMPLANT;  Surgeon: Eustace Moore, MD;  Location: [facility];  Service: Neurosurgery;;  . SPINAL CORD STIMULATOR REMOVAL N/A 01/11/2018   Procedure: LUMBAR SPINAL CORD STIMULATOR REMOVAL;  Surgeon: Eustace Moore, MD;  Location: [facility];  Service: Neurosurgery;  Laterality: N/A;  . TUBAL LIGATION      MEDICATIONS: . amLODipine (NORVASC) 10 MG tablet  . buPROPion (WELLBUTRIN XL) 150 MG 24 hr tablet  . clobetasol cream (TEMOVATE) 0.05 %  . cloNIDine (CATAPRES) 0.1 MG tablet  . cyclobenzaprine (FLEXERIL) 10 MG tablet  . escitalopram (LEXAPRO) 20 MG tablet  . folic acid (FOLVITE) 1 MG tablet  . gabapentin (NEURONTIN) 300 MG capsule  . Halcinonide 0.1 % OINT  . HYDROcodone-acetaminophen (NORCO) 7.5-325 MG tablet  . hydroxychloroquine (PLAQUENIL) 200 MG tablet  . hydrOXYzine (ATARAX/VISTARIL) 25 MG tablet  . Insulin Glargine (LANTUS) 100 UNIT/ML Solostar Pen  . Insulin Pen Needle (PEN NEEDLES) 31G X 6 MM MISC  . metFORMIN (GLUCOPHAGE-XR) 500 MG 24 hr tablet  . methotrexate (RHEUMATREX) 2.5 MG tablet  . metoprolol tartrate (LOPRESSOR) 100 MG tablet  . pilocarpine (SALAGEN) 5 MG tablet  . rosuvastatin (CRESTOR) 5 MG tablet  . tacrolimus (PROTOPIC) 0.1 % ointment  . Triamcinolone Acetonide (TRIAMCINOLONE 0.1 % CREAM : EUCERIN) CREA  . valsartan-hydrochlorothiazide (DIOVAN-HCT) 320-25 MG tablet  . Vitamin D, Ergocalciferol, (DRISDOL) 50000 units CAPS capsule   No current facility-administered medications for this encounter.    If no changes, I anticipate pt can proceed with surgery as scheduled.    Willeen Cass, FNP-BC [facility]est[facility]iology Phone: [phone] 05/18/2018 12:56 PM

## 2018-05-23 ENCOUNTER — Encounter: Payer: Self-pay | Admitting: Primary Care

## 2018-05-25 ENCOUNTER — Ambulatory Visit: Payer: BLUE CROSS/BLUE SHIELD | Admitting: Psychology

## 2018-05-25 MED ORDER — VANCOMYCIN HCL 10 G IV SOLR
1500.0000 mg | INTRAVENOUS | Status: AC
  Administered 2018-05-26: 1500 mg via INTRAVENOUS
  Filled 2018-05-25: qty 1500

## 2018-05-26 ENCOUNTER — Encounter (HOSPITAL_COMMUNITY)
Admission: RE | Disposition: A | Discharge status: Home / Self Care | Payer: Self-pay | Source: Home / Self Care | Attending: Neurological Surgery

## 2018-05-26 ENCOUNTER — Inpatient Hospital Stay (HOSPITAL_COMMUNITY)
Admission: RE | Admit: 2018-05-26 | Discharge: 2018-05-30 | DRG: 460 | Disposition: A | Discharge status: Home / Self Care | Payer: BLUE CROSS/BLUE SHIELD | Attending: Neurological Surgery | Admitting: Neurological Surgery

## 2018-05-26 ENCOUNTER — Ambulatory Visit (HOSPITAL_COMMUNITY): Payer: BLUE CROSS/BLUE SHIELD | Admitting: Anesthesiology

## 2018-05-26 ENCOUNTER — Encounter (HOSPITAL_COMMUNITY): Payer: Self-pay | Admitting: Certified Registered Nurse Anesthetist

## 2018-05-26 ENCOUNTER — Ambulatory Visit (HOSPITAL_COMMUNITY): Payer: BLUE CROSS/BLUE SHIELD

## 2018-05-26 ENCOUNTER — Other Ambulatory Visit: Payer: Self-pay

## 2018-05-26 ENCOUNTER — Inpatient Hospital Stay (HOSPITAL_COMMUNITY): Payer: BLUE CROSS/BLUE SHIELD

## 2018-05-26 ENCOUNTER — Ambulatory Visit (HOSPITAL_COMMUNITY): Payer: BLUE CROSS/BLUE SHIELD | Admitting: Emergency Medicine

## 2018-05-26 DIAGNOSIS — Z87891 Personal history of nicotine dependence: Secondary | ICD-10-CM

## 2018-05-26 DIAGNOSIS — Z888 Allergy status to other drugs, medicaments and biological substances status: Secondary | ICD-10-CM | POA: Diagnosis not present

## 2018-05-26 DIAGNOSIS — Z79899 Other long term (current) drug therapy: Secondary | ICD-10-CM | POA: Diagnosis not present

## 2018-05-26 DIAGNOSIS — F449 Dissociative and conversion disorder, unspecified: Secondary | ICD-10-CM | POA: Diagnosis not present

## 2018-05-26 DIAGNOSIS — Z981 Arthrodesis status: Secondary | ICD-10-CM

## 2018-05-26 DIAGNOSIS — M069 Rheumatoid arthritis, unspecified: Secondary | ICD-10-CM | POA: Diagnosis present

## 2018-05-26 DIAGNOSIS — Z79891 Long term (current) use of opiate analgesic: Secondary | ICD-10-CM | POA: Diagnosis not present

## 2018-05-26 DIAGNOSIS — Z794 Long term (current) use of insulin: Secondary | ICD-10-CM

## 2018-05-26 DIAGNOSIS — Z803 Family history of malignant neoplasm of breast: Secondary | ICD-10-CM | POA: Diagnosis not present

## 2018-05-26 DIAGNOSIS — Z881 Allergy status to other antibiotic agents status: Secondary | ICD-10-CM | POA: Diagnosis not present

## 2018-05-26 DIAGNOSIS — Z823 Family history of stroke: Secondary | ICD-10-CM | POA: Diagnosis not present

## 2018-05-26 DIAGNOSIS — Z90710 Acquired absence of both cervix and uterus: Secondary | ICD-10-CM | POA: Diagnosis not present

## 2018-05-26 DIAGNOSIS — E119 Type 2 diabetes mellitus without complications: Secondary | ICD-10-CM | POA: Diagnosis present

## 2018-05-26 DIAGNOSIS — M797 Fibromyalgia: Secondary | ICD-10-CM | POA: Diagnosis present

## 2018-05-26 DIAGNOSIS — Z8249 Family history of ischemic heart disease and other diseases of the circulatory system: Secondary | ICD-10-CM

## 2018-05-26 DIAGNOSIS — M4316 Spondylolisthesis, lumbar region: Principal | ICD-10-CM | POA: Diagnosis present

## 2018-05-26 DIAGNOSIS — F419 Anxiety disorder, unspecified: Secondary | ICD-10-CM | POA: Diagnosis present

## 2018-05-26 DIAGNOSIS — M1990 Unspecified osteoarthritis, unspecified site: Secondary | ICD-10-CM | POA: Diagnosis present

## 2018-05-26 DIAGNOSIS — I10 Essential (primary) hypertension: Secondary | ICD-10-CM | POA: Diagnosis present

## 2018-05-26 DIAGNOSIS — R51 Headache: Secondary | ICD-10-CM | POA: Diagnosis not present

## 2018-05-26 DIAGNOSIS — Z800 Family history of malignant neoplasm of digestive organs: Secondary | ICD-10-CM

## 2018-05-26 DIAGNOSIS — M4696 Unspecified inflammatory spondylopathy, lumbar region: Secondary | ICD-10-CM | POA: Diagnosis present

## 2018-05-26 DIAGNOSIS — R202 Paresthesia of skin: Secondary | ICD-10-CM | POA: Diagnosis not present

## 2018-05-26 DIAGNOSIS — R42 Dizziness and giddiness: Secondary | ICD-10-CM | POA: Diagnosis not present

## 2018-05-26 DIAGNOSIS — Z91048 Other nonmedicinal substance allergy status: Secondary | ICD-10-CM | POA: Diagnosis not present

## 2018-05-26 DIAGNOSIS — M3500 Sicca syndrome, unspecified: Secondary | ICD-10-CM | POA: Diagnosis present

## 2018-05-26 DIAGNOSIS — I7300 Raynaud's syndrome without gangrene: Secondary | ICD-10-CM | POA: Diagnosis present

## 2018-05-26 DIAGNOSIS — E669 Obesity, unspecified: Secondary | ICD-10-CM | POA: Diagnosis present

## 2018-05-26 DIAGNOSIS — F329 Major depressive disorder, single episode, unspecified: Secondary | ICD-10-CM | POA: Diagnosis present

## 2018-05-26 DIAGNOSIS — Z6835 Body mass index (BMI) 35.0-35.9, adult: Secondary | ICD-10-CM

## 2018-05-26 DIAGNOSIS — M329 Systemic lupus erythematosus, unspecified: Secondary | ICD-10-CM | POA: Diagnosis present

## 2018-05-26 DIAGNOSIS — Z419 Encounter for procedure for purposes other than remedying health state, unspecified: Secondary | ICD-10-CM

## 2018-05-26 HISTORY — DX: Arthrodesis status: Z98.1

## 2018-05-26 LAB — GLUCOSE, CAPILLARY
GLUCOSE-CAPILLARY: 117 mg/dL — AB (ref 65–99)
Glucose-Capillary: 162 mg/dL — ABNORMAL HIGH (ref 65–99)
Glucose-Capillary: 140 mg/dL — ABNORMAL HIGH (ref 65–99)

## 2018-05-26 SURGERY — POSTERIOR LUMBAR FUSION 2 LEVEL
Anesthesia: General | Site: Back

## 2018-05-26 MED ORDER — OXYCODONE HCL 5 MG/5ML PO SOLN: 5.0000 mg | Freq: Once | ORAL | Status: AC | PRN

## 2018-05-26 MED ORDER — ACETAMINOPHEN 325 MG PO TABS: 650.0000 mg | ORAL_TABLET | ORAL | Status: DC | PRN

## 2018-05-26 MED ORDER — VANCOMYCIN HCL 1000 MG IV SOLR
INTRAVENOUS | Status: AC
  Filled 2018-05-26: qty 1000

## 2018-05-26 MED ORDER — THROMBIN 5000 UNITS EX SOLR
CUTANEOUS | Status: AC
  Filled 2018-05-26: qty 5000

## 2018-05-26 MED ORDER — METHOCARBAMOL 500 MG PO TABS
ORAL_TABLET | ORAL | Status: AC
  Filled 2018-05-26: qty 1

## 2018-05-26 MED ORDER — SENNA 8.6 MG PO TABS
1.0000 | ORAL_TABLET | Freq: Two times a day (BID) | ORAL | Status: DC
  Administered 2018-05-26 – 2018-05-30 (×8): 8.6 mg via ORAL
  Filled 2018-05-26 (×8): qty 1

## 2018-05-26 MED ORDER — PHENOL 1.4 % MT LIQD: 1.0000 | OROMUCOSAL | Status: DC | PRN

## 2018-05-26 MED ORDER — ARTIFICIAL TEARS OPHTHALMIC OINT
TOPICAL_OINTMENT | OPHTHALMIC | Status: AC
  Filled 2018-05-26: qty 3.5

## 2018-05-26 MED ORDER — SODIUM CHLORIDE 0.9 % IV SOLN: 250.0000 mL | INTRAVENOUS | Status: DC

## 2018-05-26 MED ORDER — OXYCODONE HCL 5 MG PO TABS
5.0000 mg | ORAL_TABLET | Freq: Once | ORAL | Status: AC | PRN
  Administered 2018-05-26: 5 mg via ORAL

## 2018-05-26 MED ORDER — HYDROCHLOROTHIAZIDE 25 MG PO TABS
25.0000 mg | ORAL_TABLET | Freq: Every day | ORAL | Status: DC
  Administered 2018-05-29 – 2018-05-30 (×2): 25 mg via ORAL
  Filled 2018-05-26 (×3): qty 1

## 2018-05-26 MED ORDER — CHLORHEXIDINE GLUCONATE CLOTH 2 % EX PADS: 6.0000 | MEDICATED_PAD | Freq: Once | CUTANEOUS | Status: DC

## 2018-05-26 MED ORDER — ESCITALOPRAM OXALATE 10 MG PO TABS
20.0000 mg | ORAL_TABLET | Freq: Every day | ORAL | Status: DC
  Administered 2018-05-27 – 2018-05-30 (×4): 20 mg via ORAL
  Filled 2018-05-26 (×4): qty 2

## 2018-05-26 MED ORDER — METOPROLOL TARTRATE 50 MG PO TABS
100.0000 mg | ORAL_TABLET | Freq: Two times a day (BID) | ORAL | Status: DC
  Administered 2018-05-26 – 2018-05-30 (×5): 100 mg via ORAL
  Filled 2018-05-26 (×7): qty 2

## 2018-05-26 MED ORDER — VANCOMYCIN HCL 1000 MG IV SOLR
INTRAVENOUS | Status: DC | PRN
  Administered 2018-05-26: 1000 mg via TOPICAL

## 2018-05-26 MED ORDER — THROMBIN (RECOMBINANT) 5000 UNITS EX SOLR
OROMUCOSAL | Status: DC | PRN
  Administered 2018-05-26: 11:00:00 via TOPICAL

## 2018-05-26 MED ORDER — CLONIDINE HCL 0.1 MG PO TABS
0.1000 mg | ORAL_TABLET | Freq: Two times a day (BID) | ORAL | Status: DC
  Administered 2018-05-26 – 2018-05-30 (×4): 0.1 mg via ORAL
  Filled 2018-05-26 (×6): qty 1

## 2018-05-26 MED ORDER — THROMBIN 20000 UNITS EX SOLR
CUTANEOUS | Status: AC
  Filled 2018-05-26: qty 20000

## 2018-05-26 MED ORDER — EPHEDRINE SULFATE 50 MG/ML IJ SOLN
INTRAMUSCULAR | Status: DC | PRN
  Administered 2018-05-26 (×3): 10 mg via INTRAVENOUS
  Administered 2018-05-26: 15 mg via INTRAVENOUS

## 2018-05-26 MED ORDER — LIDOCAINE HCL (CARDIAC) PF 100 MG/5ML IV SOSY
PREFILLED_SYRINGE | INTRAVENOUS | Status: DC | PRN
  Administered 2018-05-26: 100 mg via INTRAVENOUS

## 2018-05-26 MED ORDER — ACETAMINOPHEN 650 MG RE SUPP: 650.0000 mg | RECTAL | Status: DC | PRN

## 2018-05-26 MED ORDER — ROCURONIUM BROMIDE 100 MG/10ML IV SOLN
INTRAVENOUS | Status: DC | PRN
  Administered 2018-05-26: 50 mg via INTRAVENOUS
  Administered 2018-05-26 (×2): 10 mg via INTRAVENOUS

## 2018-05-26 MED ORDER — AMLODIPINE BESYLATE 10 MG PO TABS
10.0000 mg | ORAL_TABLET | Freq: Every day | ORAL | Status: DC
  Administered 2018-05-29 – 2018-05-30 (×2): 10 mg via ORAL
  Filled 2018-05-26 (×3): qty 1

## 2018-05-26 MED ORDER — CELECOXIB 200 MG PO CAPS
200.0000 mg | ORAL_CAPSULE | Freq: Two times a day (BID) | ORAL | Status: DC
  Administered 2018-05-26 – 2018-05-28 (×5): 200 mg via ORAL
  Filled 2018-05-26 (×5): qty 1

## 2018-05-26 MED ORDER — ONDANSETRON HCL 4 MG PO TABS: 4.0000 mg | ORAL_TABLET | Freq: Four times a day (QID) | ORAL | Status: DC | PRN

## 2018-05-26 MED ORDER — PROPOFOL 10 MG/ML IV BOLUS
INTRAVENOUS | Status: DC | PRN
  Administered 2018-05-26: 120 mg via INTRAVENOUS

## 2018-05-26 MED ORDER — INSULIN ASPART 100 UNIT/ML ~~LOC~~ SOLN
0.0000 [IU] | Freq: Three times a day (TID) | SUBCUTANEOUS | Status: DC
  Administered 2018-05-27 (×2): 2 [IU] via SUBCUTANEOUS
  Administered 2018-05-27: 3 [IU] via SUBCUTANEOUS
  Administered 2018-05-28 – 2018-05-29 (×4): 2 [IU] via SUBCUTANEOUS

## 2018-05-26 MED ORDER — MIDAZOLAM HCL 5 MG/5ML IJ SOLN
INTRAMUSCULAR | Status: DC | PRN
  Administered 2018-05-26: 2 mg via INTRAVENOUS

## 2018-05-26 MED ORDER — IRBESARTAN 300 MG PO TABS
300.0000 mg | ORAL_TABLET | Freq: Every day | ORAL | Status: DC
  Administered 2018-05-29 – 2018-05-30 (×2): 300 mg via ORAL
  Filled 2018-05-26 (×3): qty 1

## 2018-05-26 MED ORDER — FOLIC ACID 1 MG PO TABS
2.0000 mg | ORAL_TABLET | Freq: Every day | ORAL | Status: DC
  Administered 2018-05-27 – 2018-05-30 (×4): 2 mg via ORAL
  Filled 2018-05-26 (×4): qty 2

## 2018-05-26 MED ORDER — BUPROPION HCL ER (XL) 150 MG PO TB24
150.0000 mg | ORAL_TABLET | Freq: Every day | ORAL | Status: DC
  Administered 2018-05-27 – 2018-05-30 (×4): 150 mg via ORAL
  Filled 2018-05-26 (×4): qty 1

## 2018-05-26 MED ORDER — HYDROMORPHONE HCL 1 MG/ML IJ SOLN
0.5000 mg | INTRAMUSCULAR | Status: DC | PRN
  Administered 2018-05-26 – 2018-05-28 (×7): 0.5 mg via INTRAVENOUS
  Filled 2018-05-26 (×7): qty 0.5

## 2018-05-26 MED ORDER — FENTANYL CITRATE (PF) 250 MCG/5ML IJ SOLN
INTRAMUSCULAR | Status: AC
  Filled 2018-05-26: qty 5

## 2018-05-26 MED ORDER — HYDROXYCHLOROQUINE SULFATE 200 MG PO TABS
200.0000 mg | ORAL_TABLET | Freq: Two times a day (BID) | ORAL | Status: DC
  Administered 2018-05-26 – 2018-05-30 (×8): 200 mg via ORAL
  Filled 2018-05-26 (×8): qty 1

## 2018-05-26 MED ORDER — BUPIVACAINE HCL (PF) 0.25 % IJ SOLN
INTRAMUSCULAR | Status: AC
  Filled 2018-05-26: qty 30

## 2018-05-26 MED ORDER — CYCLOBENZAPRINE HCL 10 MG PO TABS
10.0000 mg | ORAL_TABLET | Freq: Three times a day (TID) | ORAL | Status: DC | PRN
  Administered 2018-05-28: 10 mg via ORAL
  Filled 2018-05-26: qty 1

## 2018-05-26 MED ORDER — FENTANYL CITRATE (PF) 100 MCG/2ML IJ SOLN
25.0000 ug | INTRAMUSCULAR | Status: DC | PRN
  Administered 2018-05-26 (×2): 50 ug via INTRAVENOUS

## 2018-05-26 MED ORDER — SODIUM CHLORIDE 0.9 % IV SOLN
INTRAVENOUS | Status: DC | PRN
  Administered 2018-05-26: 11:00:00

## 2018-05-26 MED ORDER — HEPARIN SODIUM (PORCINE) 1000 UNIT/ML IJ SOLN
INTRAMUSCULAR | Status: DC | PRN
  Administered 2018-05-26: 5000 [IU] via INTRAVENOUS

## 2018-05-26 MED ORDER — METFORMIN HCL ER 500 MG PO TB24
1000.0000 mg | ORAL_TABLET | Freq: Every day | ORAL | Status: DC
  Administered 2018-05-27 – 2018-05-30 (×4): 1000 mg via ORAL
  Filled 2018-05-26 (×5): qty 2

## 2018-05-26 MED ORDER — METHOCARBAMOL 1000 MG/10ML IJ SOLN
500.0000 mg | Freq: Four times a day (QID) | INTRAVENOUS | Status: DC | PRN
  Administered 2018-05-26: 500 mg via INTRAVENOUS
  Filled 2018-05-26: qty 5

## 2018-05-26 MED ORDER — NON FORMULARY
Status: DC | PRN
  Administered 2018-05-26: 5 mL via SURGICAL_CAVITY

## 2018-05-26 MED ORDER — ONDANSETRON HCL 4 MG/2ML IJ SOLN
4.0000 mg | Freq: Four times a day (QID) | INTRAMUSCULAR | Status: DC | PRN
  Administered 2018-05-27 – 2018-05-28 (×5): 4 mg via INTRAVENOUS
  Filled 2018-05-26 (×6): qty 2

## 2018-05-26 MED ORDER — BUPIVACAINE HCL (PF) 0.25 % IJ SOLN
INTRAMUSCULAR | Status: DC | PRN
  Administered 2018-05-26: 3 mL

## 2018-05-26 MED ORDER — VALSARTAN-HYDROCHLOROTHIAZIDE 320-25 MG PO TABS: 1.0000 | ORAL_TABLET | Freq: Every day | ORAL | Status: DC

## 2018-05-26 MED ORDER — SODIUM CHLORIDE 0.9% FLUSH: 3.0000 mL | INTRAVENOUS | Status: DC | PRN

## 2018-05-26 MED ORDER — LACTATED RINGERS IV SOLN
INTRAVENOUS | Status: DC
  Administered 2018-05-26 (×2): via INTRAVENOUS

## 2018-05-26 MED ORDER — SODIUM CHLORIDE 0.9 % IJ SOLN
INTRAMUSCULAR | Status: DC | PRN
  Administered 2018-05-26: 5 mL

## 2018-05-26 MED ORDER — PHENYLEPHRINE HCL 10 MG/ML IJ SOLN
INTRAVENOUS | Status: DC | PRN
  Administered 2018-05-26: 30 ug/min via INTRAVENOUS

## 2018-05-26 MED ORDER — METHOCARBAMOL 500 MG PO TABS
500.0000 mg | ORAL_TABLET | Freq: Four times a day (QID) | ORAL | Status: DC | PRN
  Administered 2018-05-26 – 2018-05-29 (×7): 500 mg via ORAL
  Filled 2018-05-26 (×8): qty 1

## 2018-05-26 MED ORDER — VANCOMYCIN HCL 10 G IV SOLR
1250.0000 mg | Freq: Once | INTRAVENOUS | Status: AC
  Administered 2018-05-26: 1250 mg via INTRAVENOUS
  Filled 2018-05-26: qty 1250

## 2018-05-26 MED ORDER — GABAPENTIN 300 MG PO CAPS
600.0000 mg | ORAL_CAPSULE | Freq: Three times a day (TID) | ORAL | Status: DC | PRN
  Administered 2018-05-26 – 2018-05-29 (×3): 600 mg via ORAL
  Filled 2018-05-26 (×3): qty 2

## 2018-05-26 MED ORDER — 0.9 % SODIUM CHLORIDE (POUR BTL) OPTIME
TOPICAL | Status: DC | PRN
  Administered 2018-05-26: 1000 mL

## 2018-05-26 MED ORDER — POTASSIUM CHLORIDE IN NACL 20-0.9 MEQ/L-% IV SOLN
INTRAVENOUS | Status: DC
  Administered 2018-05-26: 22:00:00 via INTRAVENOUS
  Filled 2018-05-26 (×2): qty 1000

## 2018-05-26 MED ORDER — ARTIFICIAL TEARS OPHTHALMIC OINT
TOPICAL_OINTMENT | OPHTHALMIC | Status: DC | PRN
  Administered 2018-05-26: 1 via OPHTHALMIC

## 2018-05-26 MED ORDER — ONDANSETRON HCL 4 MG/2ML IJ SOLN
INTRAMUSCULAR | Status: DC | PRN
  Administered 2018-05-26: 4 mg via INTRAVENOUS

## 2018-05-26 MED ORDER — SODIUM CHLORIDE 0.9% FLUSH
3.0000 mL | Freq: Two times a day (BID) | INTRAVENOUS | Status: DC
  Administered 2018-05-26: 3 mL via INTRAVENOUS

## 2018-05-26 MED ORDER — FENTANYL CITRATE (PF) 100 MCG/2ML IJ SOLN
INTRAMUSCULAR | Status: AC
  Filled 2018-05-26: qty 2

## 2018-05-26 MED ORDER — HYDROCODONE-ACETAMINOPHEN 7.5-325 MG PO TABS
1.0000 | ORAL_TABLET | ORAL | Status: DC | PRN
  Administered 2018-05-26 – 2018-05-30 (×14): 1 via ORAL
  Filled 2018-05-26 (×14): qty 1

## 2018-05-26 MED ORDER — MIDAZOLAM HCL 2 MG/2ML IJ SOLN
INTRAMUSCULAR | Status: AC
  Filled 2018-05-26: qty 2

## 2018-05-26 MED ORDER — PROMETHAZINE HCL 25 MG/ML IJ SOLN: 6.2500 mg | INTRAMUSCULAR | Status: DC | PRN

## 2018-05-26 MED ORDER — MENTHOL 3 MG MT LOZG: 1.0000 | LOZENGE | OROMUCOSAL | Status: DC | PRN

## 2018-05-26 MED ORDER — OXYCODONE HCL 5 MG PO TABS
ORAL_TABLET | ORAL | Status: AC
  Filled 2018-05-26: qty 1

## 2018-05-26 MED ORDER — FENTANYL CITRATE (PF) 100 MCG/2ML IJ SOLN
INTRAMUSCULAR | Status: DC | PRN
  Administered 2018-05-26 (×2): 50 ug via INTRAVENOUS
  Administered 2018-05-26: 100 ug via INTRAVENOUS
  Administered 2018-05-26: 50 ug via INTRAVENOUS

## 2018-05-26 SURGICAL SUPPLY — 66 items
ADH SKN CLS APL DERMABOND .7 (GAUZE/BANDAGES/DRESSINGS) ×1
APL SKNCLS STERI-STRIP NONHPOA (GAUZE/BANDAGES/DRESSINGS) ×1
BAG DECANTER FOR FLEXI CONT (MISCELLANEOUS) ×2 IMPLANT
BASKET BONE COLLECTION (BASKET) ×2 IMPLANT
BENZOIN TINCTURE PRP APPL 2/3 (GAUZE/BANDAGES/DRESSINGS) ×2 IMPLANT
BLADE CLIPPER SURG (BLADE) IMPLANT
BUR MATCHSTICK NEURO 3.0 LAGG (BURR) ×2 IMPLANT
CANISTER SUCT 3000ML PPV (MISCELLANEOUS) ×2 IMPLANT
CARTRIDGE OIL MAESTRO DRILL (MISCELLANEOUS) ×1 IMPLANT
CONT SPEC 4OZ CLIKSEAL STRL BL (MISCELLANEOUS) ×2 IMPLANT
COVER BACK TABLE 60X90IN (DRAPES) ×2 IMPLANT
DERMABOND ADVANCED (GAUZE/BANDAGES/DRESSINGS) ×1
DERMABOND ADVANCED .7 DNX12 (GAUZE/BANDAGES/DRESSINGS) ×1 IMPLANT
DIFFUSER DRILL AIR PNEUMATIC (MISCELLANEOUS) ×2 IMPLANT
DRAPE C-ARM 42X72 X-RAY (DRAPES) ×2 IMPLANT
DRAPE C-ARMOR (DRAPES) ×2 IMPLANT
DRAPE LAPAROTOMY 100X72X124 (DRAPES) ×2 IMPLANT
DRAPE POUCH INSTRU U-SHP 10X18 (DRAPES) ×2 IMPLANT
DRAPE SURG 17X23 STRL (DRAPES) ×2 IMPLANT
DRSG OPSITE POSTOP 4X8 (GAUZE/BANDAGES/DRESSINGS) ×2 IMPLANT
DURAPREP 26ML APPLICATOR (WOUND CARE) ×2 IMPLANT
ELECT REM PT RETURN 9FT ADLT (ELECTROSURGICAL) ×2
ELECTRODE REM PT RTRN 9FT ADLT (ELECTROSURGICAL) ×1 IMPLANT
EVACUATOR 1/8 PVC DRAIN (DRAIN) IMPLANT
FIBER BOAT ALLOFUSE 15CC (Bone Implant) ×2 IMPLANT
GAUZE SPONGE 4X4 16PLY XRAY LF (GAUZE/BANDAGES/DRESSINGS) IMPLANT
GLOVE BIO SURGEON STRL SZ7 (GLOVE) IMPLANT
GLOVE BIO SURGEON STRL SZ8 (GLOVE) ×4 IMPLANT
GLOVE BIOGEL PI IND STRL 7.0 (GLOVE) IMPLANT
GLOVE BIOGEL PI INDICATOR 7.0 (GLOVE)
GOWN STRL REUS W/ TWL LRG LVL3 (GOWN DISPOSABLE) IMPLANT
GOWN STRL REUS W/ TWL XL LVL3 (GOWN DISPOSABLE) ×2 IMPLANT
GOWN STRL REUS W/TWL 2XL LVL3 (GOWN DISPOSABLE) IMPLANT
GOWN STRL REUS W/TWL LRG LVL3 (GOWN DISPOSABLE)
GOWN STRL REUS W/TWL XL LVL3 (GOWN DISPOSABLE) ×4
HEMOSTAT POWDER KIT SURGIFOAM (HEMOSTASIS) ×2 IMPLANT
KIT BASIN OR (CUSTOM PROCEDURE TRAY) ×2 IMPLANT
KIT BONE MARROW PROCESS ANGEL (KITS) ×2 IMPLANT
KIT TURNOVER KIT B (KITS) ×2 IMPLANT
MATRIX STRIP NEOCORE 12C (Putty) ×1 IMPLANT
MILL MEDIUM DISP (BLADE) IMPLANT
NEEDLE HYPO 18GX1.5 BLUNT FILL (NEEDLE) ×4 IMPLANT
NEEDLE HYPO 25X1 1.5 SAFETY (NEEDLE) ×2 IMPLANT
NS IRRIG 1000ML POUR BTL (IV SOLUTION) ×2 IMPLANT
OIL CARTRIDGE MAESTRO DRILL (MISCELLANEOUS) ×2
PACK LAMINECTOMY NEURO (CUSTOM PROCEDURE TRAY) ×2 IMPLANT
PAD ARMBOARD 7.5X6 YLW CONV (MISCELLANEOUS) ×6 IMPLANT
PUTTY DBM ALLOSYNC PURE 10CC (Putty) ×2 IMPLANT
ROD LORDOTIC KODIAK 5.5X65 (Rod) ×4 IMPLANT
SCREW KODIAK 6.5X45 (Screw) ×12 IMPLANT
SET SCREW (Screw) ×12 IMPLANT
SET SCREW SPNE (Screw) ×6 IMPLANT
SPONGE LAP 4X18 RFD (DISPOSABLE) IMPLANT
SPONGE SURGIFOAM ABS GEL 100 (HEMOSTASIS) IMPLANT
STRIP CLOSURE SKIN 1/2X4 (GAUZE/BANDAGES/DRESSINGS) ×2 IMPLANT
STRIP MATRIX NEOCORE 12CC (Putty) ×1 IMPLANT
SUT VIC AB 0 CT1 18XCR BRD8 (SUTURE) ×1 IMPLANT
SUT VIC AB 0 CT1 8-18 (SUTURE) ×2
SUT VIC AB 2-0 CP2 18 (SUTURE) ×4 IMPLANT
SUT VIC AB 3-0 SH 8-18 (SUTURE) ×4 IMPLANT
SYR CONTROL 10ML LL (SYRINGE) ×2 IMPLANT
SYRINGE 20CC LL (MISCELLANEOUS) ×4 IMPLANT
TOWEL GREEN STERILE (TOWEL DISPOSABLE) ×2 IMPLANT
TOWEL GREEN STERILE FF (TOWEL DISPOSABLE) ×2 IMPLANT
TRAY FOLEY MTR SLVR 16FR STAT (SET/KITS/TRAYS/PACK) ×2 IMPLANT
WATER STERILE IRR 1000ML POUR (IV SOLUTION) ×2 IMPLANT

## 2018-05-26 NOTE — Anesthesia Preprocedure Evaluation (Addendum)
Anesthesia Evaluation  Patient identified by MRN, date of birth, ID band Patient awake    Reviewed: Allergy & Precautions, H&P , NPO status , Patient's Chart, lab work & pertinent test results, reviewed documented beta blocker date and time   Airway Mallampati: II  TM Distance: >3 FB Neck ROM: full    Dental  (+) Dental Advisory Given, Teeth Intact   Pulmonary former smoker,    Pulmonary exam normal breath sounds clear to auscultation       Cardiovascular hypertension, Pt. on medications and Pt. on home beta blockers + Orthopnea   Rhythm:Regular Rate:Normal  '18 TTE - EF 55% to 60%. Grade 1 diastolic dysfunction. Trivial AI. A trivial pericardial effusion was identified posterior to the heart.   Neuro/Psych  Headaches, Anxiety Depression    GI/Hepatic negative GI ROS, Neg liver ROS,   Endo/Other  diabetes, Well Controlled, Type 2, Oral Hypoglycemic Agents, Insulin DependentObesity  Renal/GU Renal InsufficiencyRenal disease  negative genitourinary   Musculoskeletal  (+) Arthritis , Fibromyalgia - Lupus (systemic lupus erythematosus)  Raynaud's phenomenon Sjogren's disease   Abdominal (+) + obese,   Peds  Hematology negative hematology ROS (+)   Anesthesia Other Findings   Reproductive/Obstetrics                           Lab Results  Component Value Date   WBC 6.0 05/17/2018   HGB 14.0 05/17/2018   HCT 42.9 05/17/2018   MCV 79.0 05/17/2018   PLT 359 05/17/2018   Lab Results  Component Value Date   CREATININE 1.20 (H) 05/17/2018   BUN 16 05/17/2018   NA 138 05/17/2018   K 3.2 (L) 05/17/2018   CL 99 (L) 05/17/2018   CO2 28 05/17/2018    Anesthesia Physical  Anesthesia Plan  ASA: III  Anesthesia Plan: General   Post-op Pain Management:    Induction: Intravenous  PONV Risk Score and Plan: 4 or greater and Dexamethasone, Ondansetron, Treatment may vary due to age or medical  condition, Midazolam and Scopolamine patch - Pre-op  Airway Management Planned: Oral ETT  Additional Equipment: None  Intra-op Plan:   Post-operative Plan: Extubation in OR  Informed Consent: I have reviewed the patients History and Physical, chart, labs and discussed the procedure including the risks, benefits and alternatives for the proposed anesthesia with the patient or authorized representative who has indicated his/her understanding and acceptance.   Dental Advisory Given  Plan Discussed with: CRNA and Anesthesiologist  Anesthesia Plan Comments: (  )        Anesthesia Quick Evaluation

## 2018-05-26 NOTE — Op Note (Signed)
05/26/2018  12:56 PM  PATIENT:  Lauren Winters  52 y.o. 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 Posterior fixation L3-L5 using Alphatec pedicle screws.  2. Intertransverse arthrodesis L3-L5 using morcellized autograft and allograft soaked in the bone marrow aspirate obtained through a separate fascial incision over the right iliac crest.  SURGEON:  Sherley Bounds, MD  ASSISTANTS: Dr. Sherwood Gambler  ANESTHESIA:  General  EBL: 200 ml  Total I/O In: 1000 [I.V.:1000] Out: 400 [Urine:200; Blood:200]  BLOOD ADMINISTERED:none  DRAINS: none   INDICATION FOR PROCEDURE: This patient presented with severe back and leg pain after previous decompressive laminectomy by another surgeon. Imaging revealed post laminectomy spondylolisthesis. The patient tried a reasonable attempt at conservative medical measures without relief. I recommended decompression and instrumented fusion to address  the segmental  instability.  Patient understood the risks, benefits, and alternatives and potential outcomes and wished to proceed.  PROCEDURE DETAILS:  The patient was brought to the operating room. After induction of generalized endotracheal anesthesia the patient was rolled into the prone position on chest rolls and all pressure points were padded. The patient's lumbar region was cleaned and then prepped with DuraPrep and draped in the usual sterile fashion. Anesthesia was injected and then a dorsal midline incision was made and carried down to the lumbosacral fascia. The fascia was opened and the paraspinous musculature was taken down in a subperiosteal fashion to expose L3-L5 as well as the transverse processes of L3-L5. The spinous processes of L4 and L3 had been removed. I dissected in a suprafascial plane over the right iliac crest. We opened the fascia over the right iliac crest  and used a Jamshidi needle to extract about 50 mL of bone marrow aspirate which was then spun down for later arthrodesis. The fascia was then closed. There was significant facet arthropathy at L3-4 and L4-5. A self-retaining retractor was placed. Intraoperative fluoroscopy confirmed my level, and I started with placement of the  pedicle screws. The pedicle screw entry zones were identified utilizing surface landmarks and  AP and lateral fluoroscopy. I scored the cortex with the high-speed drill and then used the pedicle probe to probe each pedicle. The pedicles were then tapped with a 5.5 tap and probed with a ball probe and 6.5 x 45 mm pedicle screws were placed into the pedicles of L3-L4 and L5 bilaterally.  We then decorticated the transverse processes and laid a mixture of morcellized autograft and allograft out over these to perform intertransverse arthrodesis at L3-L5 bilaterally. We then placed lordotic rods into the multiaxial screw heads of the pedicle screws and locked these in position with the locking caps and anti-torque device. We then checked our construct with AP and lateral fluoroscopy. Irrigated with copious amounts of bacitracin-containing saline solution. I placed powdered vancomycin into the wound, and closed the muscle and the fascia with 0 Vicryl. Closed the subcutaneous tissues with 2-0 Vicryl and subcuticular tissues with 3-0 Vicryl. The skin was closed with benzoin and Steri-Strips. Dressing was then applied, the patient was awakened from general anesthesia and transported to the recovery room in stable condition. At the end of the procedure all sponge, needle and instrument counts were correct.   PLAN OF CARE: admit to inpatient  PATIENT DISPOSITION:  PACU - hemodynamically stable.   Delay start of Pharmacological VTE agent (>24hrs) due to surgical blood loss or risk  of bleeding:  yes

## 2018-05-26 NOTE — Progress Notes (Signed)
Pharmacy Antibiotic Note  Lauren Winters is a 52 y.o. female admitted on 05/26/2018 with surgical prophylaxis.  Pharmacy has been consulted for vancomycin dosing.  5 yof on 5/31 underwent:  1. Posterior fixation L3-L5 using Alphatec pedicle screws.  2. Intertransverse arthrodesis L3-L5 using morcellized autograft and allograft soaked in the bone marrow aspirate obtained through a separate fascial incision over the right iliac crest.  Received 1500 mg dose IV today at 0847. No drains left in place per OP note.   Plan: Vancomycin 1250 mg IV once 12 hours after last dose  Height: 5\' 6"  (167.6 cm) Weight: 220 lb (99.8 kg) IBW/kg (Calculated) : 59.3  Temp (24hrs), Avg:97.8 F (36.6 C), Min:97.2 F (36.2 C), Max:98.4 F (36.9 C)  No results for input(s): WBC, CREATININE, LATICACIDVEN, VANCOTROUGH, VANCOPEAK, VANCORANDOM, GENTTROUGH, GENTPEAK, GENTRANDOM, TOBRATROUGH, TOBRAPEAK, TOBRARND, AMIKACINPEAK, AMIKACINTROU, AMIKACIN in the last 168 hours.  Estimated Creatinine Clearance: 63.9 mL/min (A) (by C-G formula based on SCr of 1.2 mg/dL (H)).    Allergies  Allergen Reactions  . Pseudoephedrine Hcl Shortness Of Breath and Other (See Comments)    Room starts spinning, can't breath  . Clindamycin/Lincomycin Hives  . Ancef [Cefazolin] Other (See Comments)    Unknown  . Prednisone Other (See Comments)    Chest pain w/ cortisone shots or prednisone pills Elevated blood pressure  . Adhesive [Tape] Rash and Other (See Comments)    Sensitivity to certain band aids and adhesives - burns skin, makes skin raw  . Other Rash and Other (See Comments)    Sensitivity to certain band aids and adhesives - burns skin, makes skin raw  . Sudafed [Pseudoephedrine Hcl] Other (See Comments)    Dizziness     Thank you for allowing pharmacy to be a part of this patient's care.  Doylene Canard, PharmD Clinical Pharmacist  Pager: [phone] Phone: [phone] 05/26/2018 6:15 PM

## 2018-05-26 NOTE — Progress Notes (Signed)
Arrived to unit, AOx4 , VSS. Moves all extrememties

## 2018-05-26 NOTE — Anesthesia Procedure Notes (Signed)
Procedure Name: Intubation Date/Time: 05/26/2018 10:36 AM Performed by: Lance Coon, CRNA Pre-anesthesia Checklist: Patient identified, Emergency Drugs available, Suction available, Patient being monitored and Timeout performed Patient Re-evaluated:Patient Re-evaluated prior to induction Oxygen Delivery Method: Circle system utilized Preoxygenation: Pre-oxygenation with 100% oxygen Induction Type: IV induction Ventilation: Mask ventilation without difficulty Laryngoscope Size: Miller and 3 Grade View: Grade I Tube type: Oral Tube size: 7.0 mm Number of attempts: 1 Airway Equipment and Method: Stylet Placement Confirmation: ETT inserted through vocal cords under direct vision,  positive ETCO2 and breath sounds checked- equal and bilateral Secured at: 21 cm Tube secured with: Tape Dental Injury: Teeth and Oropharynx as per pre-operative assessment

## 2018-05-26 NOTE — Anesthesia Postprocedure Evaluation (Signed)
Anesthesia Post Note  Patient: Terril T Noxon  Procedure(s) Performed: Posterior Lateral Fusion - Lumbar Three-Lumbar Four - Lumbar Four-Lumbar Five with segmental pedicle screw fixation (N/A Back)     Patient location during evaluation: PACU Anesthesia Type: General Level of consciousness: awake and alert Pain management: pain level controlled Vital Signs Assessment: post-procedure vital signs reviewed and stable Respiratory status: spontaneous breathing, nonlabored ventilation, respiratory function stable and patient connected to nasal cannula oxygen Cardiovascular status: blood pressure returned to baseline and stable Postop Assessment: no apparent nausea or vomiting Anesthetic complications: no    Last Vitals:  Vitals:   05/26/18 0811 05/26/18 1300  BP: (!) 170/72 (!) 159/67  Pulse: 63 72  Resp: 18 13  Temp: 36.9 C (!) 36.2 C  SpO2: 98% 92%    Last Pain:  Vitals:   05/26/18 1300  TempSrc:   PainSc: Ten Sleep DAVID

## 2018-05-26 NOTE — Transfer of Care (Signed)
Immediate Anesthesia Transfer of Care Note  Patient: Lauren Winters  Procedure(s) Performed: Posterior Lateral Fusion - Lumbar Three-Lumbar Four - Lumbar Four-Lumbar Five with segmental pedicle screw fixation (N/A Back)  Patient Location: PACU  Anesthesia Type:General  Level of Consciousness: awake and patient cooperative  Airway & Oxygen Therapy: Patient Spontanous Breathing  Post-op Assessment: Report given to RN and Post -op Vital signs reviewed and stable  Post vital signs: Reviewed and stable  Last Vitals:  Vitals Value Taken Time  BP    Temp    Pulse 75 05/26/2018  1:01 PM  Resp 14 05/26/2018  1:01 PM  SpO2 98 % 05/26/2018  1:01 PM  Vitals shown include unvalidated device data.  Last Pain:  Vitals:   05/26/18 0855  TempSrc:   PainSc: 7       Patients Stated Pain Goal: 3 (22/63/33 5456)  Complications: No apparent anesthesia complications

## 2018-05-26 NOTE — Progress Notes (Signed)
When pt first arrived they had weak dorsi and plantar flexion bilaterally, when reassessed about 15 min later patient unable to move right leg-raise it, bend at knee or perform dorsi and plantar flexion, otherwise neurologically intact, notified Dr. Ronnald Ramp who gave a verbal order for a STAT Lumbar CT, Dr. Ronnald Ramp present in CT during scan, scan negative, patient has good muscle tone when assessed by Dr. Ronnald Ramp and is able to show limited movement in right leg. No new orders, pt back to PACU.    Rowe Pavy, RN

## 2018-05-26 NOTE — Progress Notes (Signed)
I checked on the patient in the recovery room after surgery and she was moving her feet equally. I got a call from the nursing staff about 10 minutes later stating that she would no longer move her right leg very well and I ordered a stat CT scan. I have examined her in the scanner after the scan was completed. She denies pain in the right leg. She does describe some pain in the right groin. He describes some tingling in the right toes. We are awaiting the official read on the stat CT scan from the radiologist but I have looked at the films with Dr. Sherwood Gambler and we do not see a misplaced screw, breech of the canal, obvious hematoma (there was no epidural dissection or neural exposure during this case with simple exposure of the transverse processes and placement of pedicle screws, so would be extremely hard to have a compressive epidural hematoma), or misplaced intertransverse bone graft to explain her weakness. On examination she has 0 out of 5 plantar flexion and dorsiflexion at first though she could wiggle her toes and had function of her EHL but with some coaching she had a quick movement in both dorsiflexion and plantar flexion on the right. When I lift her leg off the bed she has excellent tone and keeps her foot off the bed held high with the knee straight and I cannot break her or bend the knee suggesting excellent knee extension and hip flexion, however when the leg is on the bed she cannot flex at the knee or lift the leg off the bed. They're just seem to be some inconsistencies with her exam. No clonus. I do not have a reflex hammer in the scanner to check reflexes. My initial feeling is this is a conversion reaction. If she does not improve then I will order MRI and start with the lumbar spine.

## 2018-05-26 NOTE — H&P (Signed)
Subjective: Patient is a 52 y.o. female admitted for lumbar fusion. Onset of symptoms was several months ago, gradually worsening since that time.  The pain is rated intense, and is located at the across the lower back and radiates to legs. The pain is described as aching and occurs all day. The symptoms have been progressive. Symptoms are exacerbated by exercise. MRI or CT showed post-lami spondylolisthesis   Past Medical History:  Diagnosis Date  . Anxiety   . Atypical chest pain    a. ETT 05/2014: normal stress test without evidence of myocardial ischemia or chest pain at peak stress  . Complication of anesthesia    migraine headache  . Depression   . Diabetes mellitus without complication ([place]) metformin   dx 2015  . Dyspnea    on exertion  . Fibromyalgia   . Headache   . Heart murmur    told back in 2007, but no mention with 2016 cardiac w/u  . Hypertension   . Lupus (systemic lupus erythematosus) ([place])   . Mitral regurgitation    a. 05/2014 echo: EF >55%, nl LV/RV sys fxn, mild MR/AR, no valvular stenosis   . Obesity (BMI 30-39.9)   . Raynaud's phenomenon   . Rheumatoid arthritis ([place])   . Sjogren's disease ([place])   . Spondylolisthesis of lumbar region   . Tobacco abuse    a. ongoing; b. ongoing SOB    Past Surgical History:  Procedure Laterality Date  . ABDOMINAL HYSTERECTOMY    . BACK SURGERY    . CESAREAN SECTION     x3  . LUMBAR LAMINECTOMY    . LUMBAR LAMINECTOMY/DECOMPRESSION MICRODISCECTOMY N/A 03/29/2017   Procedure: Re operative Right Lumbar Three-Four Laminectomy and discectomy with Left Lumbar Three-Four, Bilateral Lumbar Four-Five Laminectomy for decompression;  Surgeon: Kevan Ny Ditty, MD;  Location: [facility];  Service: Neurosurgery;  Laterality: N/A;  . SPINAL CORD STIMULATOR INSERTION  12/09/2017   Procedure: LUMBAR SPINAL CORD STIMULATOR IMPLANT;  Surgeon: Eustace Moore, MD;  Location: [facility];  Service: Neurosurgery;;  . SPINAL CORD STIMULATOR REMOVAL  N/A 01/11/2018   Procedure: LUMBAR SPINAL CORD STIMULATOR REMOVAL;  Surgeon: Eustace Moore, MD;  Location: [facility];  Service: Neurosurgery;  Laterality: N/A;  . TUBAL LIGATION      Prior to Admission medications   Medication Sig Start Date End Date Taking? Authorizing Provider  amLODipine (NORVASC) 10 MG tablet Take 1 tablet once daily for blood pressure. Patient taking differently: Take 10 mg by mouth daily.  04/25/18  Yes Pleas Koch, NP  buPROPion (WELLBUTRIN XL) 150 MG 24 hr tablet Take 1 tablet by mouth once daily for anxiety and depression. Patient taking differently: Take 150 mg by mouth daily.  04/25/18  Yes Pleas Koch, NP  cloNIDine (CATAPRES) 0.1 MG tablet Take 1 tablet by mouth twice daily for blood pressure. Patient taking differently: Take 0.1 mg by mouth 2 (two) times daily.  04/25/18  Yes Pleas Koch, NP  cyclobenzaprine (FLEXERIL) 10 MG tablet Take 1 tablet (10 mg total) by mouth 3 (three) times daily as needed for muscle spasms. 01/16/18  Yes Meyran, Ocie Cornfield, NP  escitalopram (LEXAPRO) 20 MG tablet Take 20 mg by mouth daily.   Yes [provider]  folic acid (FOLVITE) 1 MG tablet Take 2 tablets (2 mg total) by mouth daily. 04/20/17 07/14/18 Yes Panwala, Naitik, PA-C  gabapentin (NEURONTIN) 300 MG capsule Take 600 mg by mouth 3 (three) times daily as needed (for  pain).  11/02/16  Yes [provider]  HYDROcodone-acetaminophen (NORCO) 7.5-325 MG tablet Take 1 tablet by mouth every 4 (four) hours as needed for moderate pain. 01/16/18  Yes Meyran, Ocie Cornfield, NP  hydroxychloroquine (PLAQUENIL) 200 MG tablet Take 1 tablet (200 mg total) by mouth 2 (two) times daily. 02/16/18  Yes Deveshwar, Abel Presto, MD  Insulin Glargine (LANTUS) 100 UNIT/ML Solostar Pen Inject 8 Units into the skin every evening. 04/25/18  Yes Pleas Koch, NP  metFORMIN (GLUCOPHAGE-XR) 500 MG 24 hr tablet Take 2 tablets every morning with food for diabetes. Patient taking  differently: Take 1,000 mg by mouth daily with breakfast.  04/25/18  Yes Pleas Koch, NP  methotrexate (RHEUMATREX) 2.5 MG tablet Take 8 tablets (20 mg total) by mouth once a week. Caution:Chemotherapy. Protect from light. Patient taking differently: Take 20 mg by mouth every Tuesday. Caution:Chemotherapy. Protect from light. 02/17/18  Yes Deveshwar, Abel Presto, MD  metoprolol tartrate (LOPRESSOR) 100 MG tablet Take 1 tablet by mouth twice daily for blood pressure. Patient taking differently: Take 100 mg by mouth 2 (two) times daily. Take 1 tablet by mouth twice daily for blood pressure. 04/25/18  Yes Pleas Koch, NP  rosuvastatin (CRESTOR) 5 MG tablet Take 1 tablet by mouth at bedtime for cholesterol. Patient taking differently: Take 5 mg by mouth at bedtime.  04/25/18  Yes Pleas Koch, NP  Triamcinolone Acetonide (TRIAMCINOLONE 0.1 % CREAM : EUCERIN) CREA Apply 1 application topically 2 (two) times daily as needed for rash or irritation.    Yes [provider]  valsartan-hydrochlorothiazide (DIOVAN-HCT) 320-25 MG tablet Take 1 tablet by mouth once daily for blood pressure. Patient taking differently: Take 1 tablet by mouth daily.  04/25/18  Yes Pleas Koch, NP  Vitamin D, Ergocalciferol, (DRISDOL) 50000 units CAPS capsule Take 1 capsule (50,000 Units total) by mouth 2 (two) times a week. 02/16/18  Yes Deveshwar, Abel Presto, MD  clobetasol cream (TEMOVATE) 2.72 % Apply 1 application topically 2 (two) times daily as needed (for lupis rash).    [provider]  Halcinonide 0.1 % OINT Apply 1 application topically 2 (two) times daily as needed (for lupis rash).     [provider]  hydrOXYzine (ATARAX/VISTARIL) 25 MG tablet Take 1 tablet (25 mg total) by mouth 2 (two) times daily as needed for anxiety. Patient not taking: Reported on 05/15/2018 07/28/16   Pleas Koch, NP  Insulin Pen Needle (PEN NEEDLES) 31G X 6 MM MISC Use with insulin as directed. Patient  not taking: Reported on 05/15/2018 04/25/18   Pleas Koch, NP  pilocarpine (SALAGEN) 5 MG tablet Take 5 mg by mouth daily as needed (for dry mouth).     [provider]  tacrolimus (PROTOPIC) 0.1 % ointment Apply 1 application topically 2 (two) times daily as needed (for lupis rash).     [provider]   Allergies  Allergen Reactions  . Pseudoephedrine Hcl Shortness Of Breath and Other (See Comments)    Room starts spinning, can't breath  . Clindamycin/Lincomycin Hives  . Ancef [Cefazolin] Other (See Comments)    Unknown  . Prednisone Other (See Comments)    Chest pain w/ cortisone shots or prednisone pills Elevated blood pressure  . Adhesive [Tape] Rash and Other (See Comments)    Sensitivity to certain band aids and adhesives - burns skin, makes skin raw  . Other Rash and Other (See Comments)    Sensitivity to certain band aids and adhesives -  burns skin, makes skin raw  . Sudafed [Pseudoephedrine Hcl] Other (See Comments)    Dizziness     Social History   Tobacco Use  . Smoking status: Former Smoker    Packs/day: 0.50    Years: 10.00    Pack years: 5.00    Types: Cigarettes    Last attempt to quit: 07/22/2015    Years since quitting: 2.8  . Smokeless tobacco: Never Used  Substance Use Topics  . Alcohol use: No    Alcohol/week: 0.0 oz    Family History  Problem Relation Age of Onset  . Hypertension Mother   . Valvular heart disease Mother   . CAD Father 36       CABG x 4  . Liver cancer Father 53       passed  . CAD Paternal Aunt        CABG  . Breast cancer Paternal Aunt   . CAD Paternal Aunt        CABG  . Breast cancer Paternal Aunt   . CAD Paternal Aunt        CABG  . Breast cancer Paternal Aunt   . CAD Paternal Aunt        CABG  . Breast cancer Paternal Aunt   . CAD Paternal Aunt        CABG  . Breast cancer Paternal Aunt   . CAD Paternal Uncle        MI s/p CABG  . Stroke Maternal Grandmother   . Stroke Maternal Grandfather    . CAD Maternal Grandfather      Review of Systems  Positive ROS: neg  All other systems have been reviewed and were otherwise negative with the exception of those mentioned in the HPI and as above.  Objective: Vital signs in last 24 hours: Temp:  [98.4 F (36.9 C)] 98.4 F (36.9 C) (05/31 0811) Pulse Rate:  [63] 63 (05/31 0811) Resp:  [18] 18 (05/31 0811) BP: (170)/(72) 170/72 (05/31 0811) SpO2:  [98 %] 98 % (05/31 0811) Weight:  [99.8 kg (220 lb)] 99.8 kg (220 lb) (05/31 0811)  General Appearance: Alert, cooperative, no distress, appears stated age Head: Normocephalic, without obvious abnormality, atraumatic Eyes: PERRL, conjunctiva/corneas clear, EOM's intact    Neck: Supple, symmetrical, trachea midline Back: Symmetric, no curvature, ROM normal, no CVA tenderness Lungs:  respirations unlabored Heart: Regular rate and rhythm Abdomen: Soft, non-tender Extremities: Extremities normal, atraumatic, no cyanosis or edema Pulses: 2+ and symmetric all extremities Skin: Skin color, texture, turgor normal, no rashes or lesions  NEUROLOGIC:   Mental status: Alert and oriented x4,  no aphasia, good attention span, fund of knowledge, and memory Motor Exam - grossly normal Sensory Exam - grossly normal Reflexes: trace Coordination - grossly normal Gait - grossly normal Balance - grossly normal Cranial Nerves: I: smell Not tested  II: visual acuity  OS: nl    OD: nl  II: visual fields Full to confrontation  II: pupils Equal, round, reactive to light  III,VII: ptosis None  III,IV,VI: extraocular muscles  Full ROM  V: mastication Normal  V: facial light touch sensation  Normal  V,VII: corneal reflex  Present  VII: facial muscle function - upper  Normal  VII: facial muscle function - lower Normal  VIII: hearing Not tested  IX: soft palate elevation  Normal  IX,X: gag reflex Present  XI: trapezius strength  5/5  XI: sternocleidomastoid strength 5/5  XI: neck flexion  strength  5/5  XII: tongue strength  Normal    Data Review Lab Results  Component Value Date   WBC 6.0 05/17/2018   HGB 14.0 05/17/2018   HCT 42.9 05/17/2018   MCV 79.0 05/17/2018   PLT 359 05/17/2018   Lab Results  Component Value Date   NA 138 05/17/2018   K 3.2 (L) 05/17/2018   CL 99 (L) 05/17/2018   CO2 28 05/17/2018   BUN 16 05/17/2018   CREATININE 1.20 (H) 05/17/2018   GLUCOSE 164 (H) 05/17/2018   Lab Results  Component Value Date   INR 0.99 05/17/2018    Assessment/Plan:  Estimated body mass index is 35.51 kg/m as calculated from the following:   Height as of this encounter: 5\' 6"  (1.676 m).   Weight as of this encounter: 99.8 kg (220 lb). Patient admitted for instrumented fusion L3-5. Patient has failed a reasonable attempt at conservative therapy.  I explained the condition and procedure to the patient and answered any questions.  Patient wishes to proceed with procedure as planned. Understands risks/ benefits and typical outcomes of procedure.   Lauren Winters S 05/26/2018 9:20 AM

## 2018-05-27 LAB — CBC
HEMATOCRIT: 36.1 % (ref 36.0–46.0)
Hemoglobin: 11.4 g/dL — ABNORMAL LOW (ref 12.0–15.0)
MCH: 25.7 pg — ABNORMAL LOW (ref 26.0–34.0)
MCHC: 31.6 g/dL (ref 30.0–36.0)
MCV: 81.3 fL (ref 78.0–100.0)
Platelets: 299 10*3/uL (ref 150–400)
RBC: 4.44 MIL/uL (ref 3.87–5.11)
RDW: 14.3 % (ref 11.5–15.5)
WBC: 7.5 10*3/uL (ref 4.0–10.5)

## 2018-05-27 LAB — BASIC METABOLIC PANEL
ANION GAP: 7 (ref 5–15)
BUN: 10 mg/dL (ref 6–20)
CHLORIDE: 104 mmol/L (ref 101–111)
CO2: 26 mmol/L (ref 22–32)
CREATININE: 1.42 mg/dL — AB (ref 0.44–1.00)
Calcium: 8.4 mg/dL — ABNORMAL LOW (ref 8.9–10.3)
GFR calc Af Amer: 48 mL/min — ABNORMAL LOW (ref >60)
GFR calc non Af Amer: 41 mL/min — ABNORMAL LOW (ref >60)
Glucose, Bld: 132 mg/dL — ABNORMAL HIGH (ref 65–99)
Potassium: 4 mmol/L (ref 3.5–5.1)
SODIUM: 137 mmol/L (ref 135–145)

## 2018-05-27 LAB — GLUCOSE, CAPILLARY
GLUCOSE-CAPILLARY: 187 mg/dL — AB (ref 65–99)
GLUCOSE-CAPILLARY: 124 mg/dL — AB (ref 65–99)
GLUCOSE-CAPILLARY: 74 mg/dL (ref 65–99)
Glucose-Capillary: 125 mg/dL — ABNORMAL HIGH (ref 65–99)

## 2018-05-27 MED ORDER — SODIUM CHLORIDE 0.9 % IV BOLUS: 500.0000 mL | Freq: Once | INTRAVENOUS | Status: DC

## 2018-05-27 NOTE — Evaluation (Signed)
Occupational Therapy Evaluation Patient Details Name: Lauren Winters MRN: [id number] DOB: Sep 24, 1966 Today's Date: 05/27/2018    History of Present Illness Patient is a 52 y.o. 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 Pt has a PMH including Anxiety, Atypical chest pain, Depression, DMII, Dyspnea, Fibromyalgia, Headache, Heart murmur, HTN, Lupus, Mitral regurgitation, Obesity, Raynaud's phenomenon, Rheumatoid arthritis, Sjogren's disease, Spondylolisthesis of lumbar region, and Tobacco abuse, Spinal cord stimulator insertion (12/09/2017);   Clinical Impression   PTA Pt required assist for LB dressing PRN from husband, but other than that was mod I for ADL and mobility. Pt is currently max A for LB ADL, set up for grooming tasks in seated position, and mod A for UB dressing/bathing. Pt is eager for AE education to maximize independence in these areas. Pt will benefit from skilled OT in the acute setting and afterwards will initially require 24/7 supervision for safety. Next session to focus on AE education.   OF NOTE: Patient with labile orthostatic and symptomatic BP. Reports "swimmy headed feeling" and nausea. Patient BP supine 90s/60, initially sitting upper 60s/40s, improved with extended sitting to 90s/50s. RN aware    Follow Up Recommendations  Supervision/Assistance - 24 hour(initially)    Equipment Recommendations  Other (comment)(AE for LB ADL)    Recommendations for Other Services       Precautions / Restrictions Precautions Precautions: Fall;Back Precaution Booklet Issued: Yes (comment) Precaution Comments: verbally reviewed 3/3 Required Braces or Orthoses: Spinal Brace Spinal Brace: Lumbar corset      Mobility Bed Mobility Overal bed mobility: Needs Assistance Bed Mobility: Rolling;Sidelying to Sit Rolling: Min guard Sidelying to sit: Min assist       General bed mobility comments: min assist for power up to upright, increased time and effort to  perform  Transfers Overall transfer level: Needs assistance Equipment used: Rolling walker (2 wheeled) Transfers: Sit to/from Stand Sit to Stand: Min assist         General transfer comment: min assist to stabilize RW, increased time to power up to standing position    Balance Overall balance assessment: Mild deficits observed, not formally tested         Standing balance support: Bilateral upper extremity supported;During functional activity Standing balance-Leahy Scale: Poor Standing balance comment: relaince on UE support                           ADL either performed or assessed with clinical judgement   ADL Overall ADL's : Needs assistance/impaired Eating/Feeding: Modified independent;Sitting   Grooming: Set up;Sitting;Wash/dry hands   Upper Body Bathing: Moderate assistance   Lower Body Bathing: Maximal assistance   Upper Body Dressing : Moderate assistance Upper Body Dressing Details (indicate cue type and reason): to don brace - education provided Lower Body Dressing: Maximal assistance;Sit to/from stand   Toilet Transfer: Minimal assistance;Ambulation;RW Toilet Transfer Details (indicate cue type and reason): increased time and vc for safe hand placement Toileting- Clothing Manipulation and Hygiene: Maximal assistance;Sit to/from stand       Functional mobility during ADLs: Minimal assistance;Rolling walker;Cueing for sequencing General ADL Comments: Pt very interested in AE for LB ADL     Vision Patient Visual Report: No change from baseline       Perception     Praxis      Pertinent Vitals/Pain       Hand Dominance Right   Extremity/Trunk Assessment Upper Extremity Assessment Upper Extremity Assessment: Generalized weakness   Lower  Extremity Assessment Lower Extremity Assessment: Generalized weakness   Cervical / Trunk Assessment Cervical / Trunk Assessment: Other exceptions Cervical / Trunk Exceptions: s/p sx    Communication Communication Communication: No difficulties   Cognition Arousal/Alertness: Awake/alert Behavior During Therapy: WFL for tasks assessed/performed Overall Cognitive Status: Within Functional Limits for tasks assessed                                     General Comments  husband present throughout session    Exercises     Shoulder Instructions      Home Living Family/patient expects to be discharged to:: Private residence Living Arrangements: Spouse/significant other Available Help at Discharge: Family Type of Home: House Home Access: Stairs to enter Technical brewer of Steps: 2 Entrance Stairs-Rails: None Home Layout: One level     Bathroom Shower/Tub: Occupational psychologist: Standard     Home Equipment: Cane - single point;Walker - 4 wheels;Bedside commode          Prior Functioning/Environment Level of Independence: Needs assistance  Gait / Transfers Assistance Needed: limited time in standing and walking ADL's / Homemaking Assistance Needed: assist with donning shoes            OT Problem List: Decreased activity tolerance;Impaired balance (sitting and/or standing);Decreased knowledge of use of DME or AE;Decreased knowledge of precautions;Pain      OT Treatment/Interventions: Self-care/ADL training;DME and/or AE instruction;Therapeutic activities;Patient/family education;Balance training    OT Goals(Current goals can be found in the care plan section) Acute Rehab OT Goals Patient Stated Goal: to go home OT Goal Formulation: With patient/family Time For Goal Achievement: 06/10/18 Potential to Achieve Goals: Good ADL Goals Pt Will Perform Lower Body Bathing: with supervision;with adaptive equipment Pt Will Perform Lower Body Dressing: with supervision;sit to/from stand;with adaptive equipment Pt Will Transfer to Toilet: with supervision;ambulating Pt Will Perform Toileting - Clothing Manipulation and hygiene:  with supervision;with adaptive equipment;sit to/from stand Additional ADL Goal #1: Pt will recall and maintain back precautions during ADL with 2 or less verbal cues  OT Frequency: Min 3X/week   Barriers to D/C:            Co-evaluation              AM-PAC PT "6 Clicks" Daily Activity     Outcome Measure Help from another person eating meals?: None Help from another person taking care of personal grooming?: A Little Help from another person toileting, which includes using toliet, bedpan, or urinal?: A Lot Help from another person bathing (including washing, rinsing, drying)?: A Lot Help from another person to put on and taking off regular upper body clothing?: A Lot Help from another person to put on and taking off regular lower body clothing?: A Lot 6 Click Score: 15   End of Session Equipment Utilized During Treatment: Gait belt;Rolling walker Nurse Communication: Mobility status;Precautions  Activity Tolerance: Patient tolerated treatment well;Other (comment)(monitored BP closely) Patient left: in chair;with call bell/phone within reach;with family/visitor present  OT Visit Diagnosis: Other abnormalities of gait and mobility (R26.89);Muscle weakness (generalized) (M62.81);Pain Pain - Right/Left: (central) Pain - part of body: (back)                Time: 1025-1050 OT Time Calculation (min): 25 min Charges:  OT General Charges $OT Visit: 1 Visit OT Evaluation $OT Eval Moderate Complexity: 1 Mod G-Codes:     Hulda Humphrey  OTR/L [place] 05/27/2018, 3:35 PM

## 2018-05-27 NOTE — Progress Notes (Signed)
Pt O OB to ambulate ,but c/o dizziness and nausea after couple of steps to the  room door. Sat pt in the chair/. Medicated for nausea & and BP rechecked 7 was 98/54 hr 72.  Pt returned to bed with 1-2 assist.  Rn will continue to monitor.

## 2018-05-27 NOTE — Progress Notes (Signed)
Patient ID: Lauren Winters, female   DOB: Oct 03, 1966, 52 y.o.   MRN: [id number] BP (!) 96/50 (BP Location: Right Arm)   Pulse (!) 58   Temp 98.3 F (36.8 C) (Oral)   Resp 18   Ht 5\' 6"  (1.676 m)   Wt 99.8 kg (220 lb)   SpO2 100%   BMI 35.51 kg/m  Moving both lower extremities well this morning Wound is clean, flat and dry improving

## 2018-05-27 NOTE — Evaluation (Signed)
Physical Therapy Evaluation Patient Details Name: Lauren Winters MRN: [id number] DOB: [birth year] Today's Date: 05/27/2018   History of Present Illness  Patient is a 52 y.o. 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 Pt has a PMH including Anxiety, Atypical chest pain, Depression, DMII, Dyspnea, Fibromyalgia, Headache, Heart murmur, HTN, Lupus, Mitral regurgitation, Obesity, Raynaud's phenomenon, Rheumatoid arthritis, Sjogren's disease, Spondylolisthesis of lumbar region, and Tobacco abuse, Spinal cord stimulator insertion (12/09/2017);  Clinical Impression  Patient seen for mobility assessment s/p spinal surgery. Patient demonstrates deficits in functional mobility as indicated below. Will benefit from continued skilled PT to address deficits and maximize function. Will see as indicated and progress as tolerated.   OF NOTE: Patient with labile orthostatic and symptomatic BP. Reports "swimmy headed feeling" and nausea. Patient BP supine 90s/60, initially sitting upper 60s/40s, improved with extended sitting to 90s/50s. Nursing aware     Follow Up Recommendations No PT follow up;Supervision/Assistance - 24 hour    Equipment Recommendations  None recommended by PT    Recommendations for Other Services       Precautions / Restrictions Precautions Precautions: Fall;Back Precaution Comments: verbally reviewed  Required Braces or Orthoses: Spinal Brace Spinal Brace: Lumbar corset      Mobility  Bed Mobility Overal bed mobility: Needs Assistance Bed Mobility: Rolling;Sidelying to Sit Rolling: Min guard Sidelying to sit: Min assist       General bed mobility comments: min assist for power up to upright, increased time and effort to perform  Transfers Overall transfer level: Needs assistance Equipment used: Rolling walker (2 wheeled) Transfers: Sit to/from Stand Sit to Stand: Min assist         General transfer comment: min assist to stabilize RW, increased  time to power up to standing position  Ambulation/Gait Ambulation/Gait assistance: Min assist;Min guard Ambulation Distance (Feet): 14 Feet Assistive device: Rolling walker (2 wheeled) Gait Pattern/deviations: Step-to pattern;Decreased stride length;Shuffle Gait velocity: decreased Gait velocity interpretation: <1.31 ft/sec, indicative of household ambulator General Gait Details: patient slow and guarded, limited by pain and nausea with mobility  Stairs            Wheelchair Mobility    Modified Rankin (Stroke Patients Only)       Balance Overall balance assessment: Mild deficits observed, not formally tested         Standing balance support: Bilateral upper extremity supported;During functional activity Standing balance-Leahy Scale: Poor Standing balance comment: relaince on UE support                             Pertinent Vitals/Pain      Home Living Family/patient expects to be discharged to:: Private residence Living Arrangements: Spouse/significant other Available Help at Discharge: Family Type of Home: House Home Access: Stairs to enter Entrance Stairs-Rails: None Entrance Stairs-Number of Steps: 2 Home Layout: One level Home Equipment: Cane - single point;Walker - 4 wheels      Prior Function Level of Independence: Needs assistance   Gait / Transfers Assistance Needed: limited time in standing and walking  ADL's / Homemaking Assistance Needed: assist with donning shoes        Hand Dominance   Dominant Hand: Right    Extremity/Trunk Assessment   Upper Extremity Assessment Upper Extremity Assessment: Generalized weakness    Lower Extremity Assessment Lower Extremity Assessment: Generalized weakness       Communication   Communication: No difficulties  Cognition Arousal/Alertness: Awake/alert Behavior During Therapy: [facility]  for tasks assessed/performed Overall Cognitive Status: Within Functional Limits for tasks assessed                                         General Comments      Exercises     Assessment/Plan    PT Assessment Patient needs continued PT services  PT Problem List Decreased strength;Decreased activity tolerance;Decreased mobility;Decreased balance;Decreased safety awareness;Decreased knowledge of precautions;Pain       PT Treatment Interventions DME instruction;Gait training;Stair training;Functional mobility training;Therapeutic activities;Therapeutic exercise;Balance training;Patient/family education    PT Goals (Current goals can be found in the Care Plan section)  Acute Rehab PT Goals Patient Stated Goal: to go home PT Goal Formulation: With patient/family Time For Goal Achievement: 06/10/18 Potential to Achieve Goals: Good    Frequency Min 5X/week   Barriers to discharge        Co-evaluation               AM-PAC PT "6 Clicks" Daily Activity  Outcome Measure Difficulty turning over in bed (including adjusting bedclothes, sheets and blankets)?: Unable Difficulty moving from lying on back to sitting on the side of the bed? : Unable Difficulty sitting down on and standing up from a chair with arms (e.g., wheelchair, bedside commode, etc,.)?: Unable Help needed moving to and from a bed to chair (including a wheelchair)?: A Little Help needed walking in hospital room?: A Little Help needed climbing 3-5 steps with a railing? : A Lot 6 Click Score: 11    End of Session Equipment Utilized During Treatment: Gait belt;Back brace Activity Tolerance: Patient limited by pain;Treatment limited secondary to medical complications (Comment)(labile BP with symptoms) Patient left: in chair;with call bell/phone within reach;with family/visitor present Nurse Communication: Mobility status PT Visit Diagnosis: Difficulty in walking, not elsewhere classified (R26.2)    Time: 1025-1050 PT Time Calculation (min) (ACUTE ONLY): 25 min   Charges:   PT Evaluation $PT  Eval Moderate Complexity: 1 Mod     PT G Codes:        Alben Deeds, PT DPT  Board Certified Neurologic Specialist [phone]   Duncan Dull 05/27/2018, 10:55 AM

## 2018-05-28 ENCOUNTER — Other Ambulatory Visit: Payer: Self-pay

## 2018-05-28 LAB — GLUCOSE, CAPILLARY
GLUCOSE-CAPILLARY: 148 mg/dL — AB (ref 65–99)
GLUCOSE-CAPILLARY: 121 mg/dL — AB (ref 65–99)
Glucose-Capillary: 123 mg/dL — ABNORMAL HIGH (ref 65–99)
Glucose-Capillary: 115 mg/dL — ABNORMAL HIGH (ref 65–99)

## 2018-05-28 MED ORDER — SODIUM CHLORIDE 0.9 % IV SOLN
250.0000 mL | INTRAVENOUS | Status: DC
  Administered 2018-05-29: 250 mL via INTRAVENOUS

## 2018-05-28 MED ORDER — POLYETHYLENE GLYCOL 3350 17 G PO PACK
17.0000 g | PACK | Freq: Every day | ORAL | Status: DC
  Administered 2018-05-28 – 2018-05-29 (×2): 17 g via ORAL
  Filled 2018-05-28 (×3): qty 1

## 2018-05-28 MED ORDER — BISACODYL 10 MG RE SUPP: 10.0000 mg | Freq: Every day | RECTAL | Status: DC | PRN

## 2018-05-28 NOTE — Progress Notes (Signed)
RN walked patient and patient reported she was dizzy and that the hallway was moving. Patient made it to the door and back to the bed approx 50 feet. Will continue to attempt walking further with the patient.

## 2018-05-28 NOTE — Progress Notes (Signed)
Occupational Therapy Treatment Patient Details Name: Lauren Winters MRN: [id number] DOB: 1966/11/15 Today's Date: 05/28/2018    History of present illness Patient is a 52 y.o. 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 Pt has a PMH including Anxiety, Atypical chest pain, Depression, DMII, Dyspnea, Fibromyalgia, Headache, Heart murmur, HTN, Lupus, Mitral regurgitation, Obesity, Raynaud's phenomenon, Rheumatoid arthritis, Sjogren's disease, Spondylolisthesis of lumbar region, and Tobacco abuse, Spinal cord stimulator insertion (12/09/2017);   OT comments  Pt. Provided with A/E training session for increasing safety and independence with LB ADLs.  Able to return demo of use and very interested for use at home.  Provided info. To pt.and spouse who was present for purchase in gift shop.    Follow Up Recommendations  Supervision/Assistance - 24 hour    Equipment Recommendations  Other (comment)    Recommendations for Other Services      Precautions / Restrictions Precautions Precautions: Fall;Back Precaution Comments: verbally reviewed 3/3 Required Braces or Orthoses: Spinal Brace Spinal Brace: Lumbar corset       Mobility Bed Mobility Overal bed mobility: Needs Assistance Bed Mobility: Rolling;Sidelying to Sit Rolling: Supervision Sidelying to sit: Supervision       General bed mobility comments: HOB flat, exits on L side. verbal cues to maintain no twisting but able to complete mobilty with no physical assistance  Transfers                 General transfer comment: no completed during session. pt. sat eob for A/E training    Balance                                           ADL either performed or assessed with clinical judgement   ADL Overall ADL's : Needs assistance/impaired             Lower Body Bathing: Cueing for compensatory techniques;Cueing for back precautions;With adaptive equipment Lower Body Bathing Details  (indicate cue type and reason): provided example of and demo of [place] of LH for LB adls Upper Body Dressing : Set up;Sitting Upper Body Dressing Details (indicate cue type and reason): DON brace with set up seated Lower Body Dressing: With adaptive equipment;Cueing for sequencing;Cueing for compensatory techniques;Cueing for back precautions;Sitting/lateral leans Lower Body Dressing Details (indicate cue type and reason): Pt. able to use reacher and sock aide for LB dressing demo with A/E       Toileting - Clothing Manipulation Details (indicate cue type and reason): Reviewed tongs options for peri care following a BM     Functional mobility during ADLs: Set up General ADL Comments: Pt very interested in AE for LB ADL-provided info. for purcahse, pt. able to return demo of all A/E      Vision       Perception     Praxis      Cognition Arousal/Alertness: Awake/alert Behavior During Therapy: WFL for tasks assessed/performed Overall Cognitive Status: Within Functional Limits for tasks assessed                                          Exercises     Shoulder Instructions       General Comments      Pertinent Vitals/ Pain       Pain Assessment: 0-10  Pain Score: 5  Pain Location: R hip Pain Descriptors / Indicators: Aching Pain Intervention(s): Limited activity within patient's tolerance;Monitored during session;Repositioned  Home Living                                          Prior Functioning/Environment              Frequency  Min 3X/week        Progress Toward Goals  OT Goals(current goals can now be found in the care plan section)  Progress towards OT goals: Progressing toward goals     Plan      Co-evaluation                 AM-PAC PT "6 Clicks" Daily Activity     Outcome Measure   Help from another person eating meals?: None Help from another person taking care of personal grooming?: A Little Help  from another person toileting, which includes using toliet, bedpan, or urinal?: A Lot Help from another person bathing (including washing, rinsing, drying)?: A Lot Help from another person to put on and taking off regular upper body clothing?: A Lot Help from another person to put on and taking off regular lower body clothing?: A Lot 6 Click Score: 15    End of Session Equipment Utilized During Treatment: Other (comment)(a/e)  OT Visit Diagnosis: Other abnormalities of gait and mobility (R26.89);Muscle weakness (generalized) (M62.81);Pain   Activity Tolerance     Patient Left Other (comment)(seated eob , husband present, PT present to start their session)   Nurse Communication          Time: 5916-3846 OT Time Calculation (min): 16 min  Charges: OT General Charges $OT Visit: 1 Visit OT Treatments $Self Care/Home Management : 8-22 mins  Janice Coffin, COTA/L 05/28/2018, 11:09 AM

## 2018-05-28 NOTE — Progress Notes (Signed)
Physical Therapy Treatment Patient Details Name: CARIME DINKEL MRN: [id number] DOB: August 02, 1966 Today's Date: 05/28/2018    History of Present Illness Patient is a 52 y.o. 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 Pt has a PMH including Anxiety, Atypical chest pain, Depression, DMII, Dyspnea, Fibromyalgia, Headache, Heart murmur, HTN, Lupus, Mitral regurgitation, Obesity, Raynaud's phenomenon, Rheumatoid arthritis, Sjogren's disease, Spondylolisthesis of lumbar region, and Tobacco abuse, Spinal cord stimulator insertion (12/09/2017);    PT Comments    Patient seen for activity and mobility. Progression is limited today due to pain, intense headache when upright, dizziness (labile BP) and overall weakness. Patient reports that when she sits up and stands up that she has an intense headache 7/10. Patient also reporting that she has visual changes when attempting to focus (vertical movement in vision). Current POC remains appropriate, however do feel patient will need a few days of activity progression. Will continue to see and progress as tolerated.   Follow Up Recommendations  No PT follow up;Supervision/Assistance - 24 hour     Equipment Recommendations  None recommended by PT    Recommendations for Other Services       Precautions / Restrictions Precautions Precautions: Fall;Back Precaution Booklet Issued: Yes (comment) Precaution Comments: verbally reviewed 3/3 Required Braces or Orthoses: Spinal Brace Spinal Brace: Lumbar corset    Mobility  Bed Mobility Overal bed mobility: Needs Assistance Bed Mobility: Rolling;Sidelying to Sit Rolling: Supervision Sidelying to sit: Supervision       General bed mobility comments: received EOB  Transfers Overall transfer level: Needs assistance Equipment used: Rolling walker (2 wheeled) Transfers: Sit to/from Stand Sit to Stand: Min assist         General transfer comment: Min assist for stability during power  up, performed x3 during session  Ambulation/Gait Ambulation/Gait assistance: Min assist;Min guard Ambulation Distance (Feet): 25 Feet Assistive device: Rolling walker (2 wheeled) Gait Pattern/deviations: Step-to pattern;Decreased stride length;Shuffle Gait velocity: decreased Gait velocity interpretation: <1.31 ft/sec, indicative of household ambulator General Gait Details: patient remains slow with mobility, guarded throughout.   Stairs             Wheelchair Mobility    Modified Rankin (Stroke Patients Only)       Balance Overall balance assessment: Mild deficits observed, not formally tested         Standing balance support: Bilateral upper extremity supported;During functional activity Standing balance-Leahy Scale: Poor Standing balance comment: relaince on UE support                            Cognition Arousal/Alertness: Awake/alert Behavior During Therapy: WFL for tasks assessed/performed Overall Cognitive Status: Within Functional Limits for tasks assessed                                        Exercises      General Comments        Pertinent Vitals/Pain Pain Assessment: 0-10 Pain Score: 7  Pain Location: right hip and headache Pain Descriptors / Indicators: Aching Pain Intervention(s): Monitored during session    Home Living                      Prior Function            PT Goals (current goals can now be found in the care plan section) Acute Rehab  PT Goals Patient Stated Goal: to go home PT Goal Formulation: With patient/family Time For Goal Achievement: 06/10/18 Potential to Achieve Goals: Good Progress towards PT goals: Progressing toward goals    Frequency    Min 5X/week      PT Plan Current plan remains appropriate    Co-evaluation              AM-PAC PT "6 Clicks" Daily Activity  Outcome Measure  Difficulty turning over in bed (including adjusting bedclothes, sheets and  blankets)?: Unable Difficulty moving from lying on back to sitting on the side of the bed? : Unable Difficulty sitting down on and standing up from a chair with arms (e.g., wheelchair, bedside commode, etc,.)?: Unable Help needed moving to and from a bed to chair (including a wheelchair)?: A Little Help needed walking in hospital room?: A Little Help needed climbing 3-5 steps with a railing? : A Lot 6 Click Score: 11    End of Session Equipment Utilized During Treatment: Gait belt;Back brace Activity Tolerance: Patient limited by pain;Treatment limited secondary to medical complications (Comment)(labile BP with symptoms) Patient left: in chair;with call bell/phone within reach;with family/visitor present Nurse Communication: Mobility status PT Visit Diagnosis: Difficulty in walking, not elsewhere classified (R26.2)     Time: 2122-4825 PT Time Calculation (min) (ACUTE ONLY): 19 min  Charges:  $Therapeutic Activity: 8-22 mins                    G Codes:       Alben Deeds, PT DPT  Board Certified Neurologic Specialist [place] 05/28/2018, 2:30 PM

## 2018-05-29 LAB — BASIC METABOLIC PANEL
ANION GAP: 8 (ref 5–15)
BUN: 9 mg/dL (ref 6–20)
CHLORIDE: 111 mmol/L (ref 101–111)
CO2: 24 mmol/L (ref 22–32)
Calcium: 8.3 mg/dL — ABNORMAL LOW (ref 8.9–10.3)
Creatinine, Ser: 1.08 mg/dL — ABNORMAL HIGH (ref 0.44–1.00)
GFR calc Af Amer: >60 mL/min (ref >60)
GFR, EST NON AFRICAN AMERICAN: 57 mL/min — AB (ref >60)
Glucose, Bld: 116 mg/dL — ABNORMAL HIGH (ref 65–99)
POTASSIUM: 3.7 mmol/L (ref 3.5–5.1)
SODIUM: 143 mmol/L (ref 135–145)

## 2018-05-29 LAB — GLUCOSE, CAPILLARY
GLUCOSE-CAPILLARY: 124 mg/dL — AB (ref 65–99)
GLUCOSE-CAPILLARY: 149 mg/dL — AB (ref 65–99)
GLUCOSE-CAPILLARY: 119 mg/dL — AB (ref 65–99)
Glucose-Capillary: 105 mg/dL — ABNORMAL HIGH (ref 65–99)

## 2018-05-29 LAB — CBC
HCT: 30.6 % — ABNORMAL LOW (ref 36.0–46.0)
HEMOGLOBIN: 10 g/dL — AB (ref 12.0–15.0)
MCH: 26.8 pg (ref 26.0–34.0)
MCHC: 32.7 g/dL (ref 30.0–36.0)
MCV: 82 fL (ref 78.0–100.0)
PLATELETS: 237 10*3/uL (ref 150–400)
RBC: 3.73 MIL/uL — AB (ref 3.87–5.11)
RDW: 14.6 % (ref 11.5–15.5)
WBC: 4.5 10*3/uL (ref 4.0–10.5)

## 2018-05-29 NOTE — Care Management Note (Signed)
Case Management Note  Patient Details  Name: LING FLESCH MRN: [id number] Date of Birth: 1966-03-04  Subjective/Objective:     Pt is s/p lumbar fusion               Action/Plan:   PTA independent from home - pt states her mom will provide 24 hour supervision at discharge.  Pt declined 3:1 as recommended due to already having 3:1 and rolling walker in the home.  Pt states she has PCP and denied barriers with obtaining/paying for medications as prescribed.  CM will continue to follow for discharge needs   Expected Discharge Date:                  Expected Discharge Plan:  Home/Self Care  In-House Referral:     Discharge planning Services  CM Consult  Post Acute Care Choice:    Choice offered to:     DME Arranged:    DME Agency:     HH Arranged:    HH Agency:     Status of Service:     If discussed at [employer] of Stay Meetings, dates discussed:    Additional Comments:  Maryclare Labrador, RN 05/29/2018, 10:45 AM

## 2018-05-29 NOTE — Progress Notes (Signed)
Occupational Therapy Treatment Patient Details Name: Lauren Winters MRN: [id number] DOB: [birth year] Today's Date: 05/29/2018    History of present illness Patient is a 52 y.o. 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 Pt has a PMH including Anxiety, Atypical chest pain, Depression, DMII, Dyspnea, Fibromyalgia, Headache, Heart murmur, HTN, Lupus, Mitral regurgitation, Obesity, Raynaud's phenomenon, Rheumatoid arthritis, Sjogren's disease, Spondylolisthesis of lumbar region, and Tobacco abuse, Spinal cord stimulator insertion (12/09/2017);   OT comments  Pt continues to report increased headache pain and dizziness with functional mobility and transitions. Pt agreeable to OT intervention and requesting to use the bathroom upon entering the room. Pt declined ambulating to bathroom secondary to increasing pain. Pt states 3/3 back precautions and performs log roll for supine >sit with min verbal cues for technique. Pt transferring to [facility] with min stand pivot and use of RW. Pt moves very cautiously and required steady assistance overall with dynamic standing balance while competing self care tasks. Pt returning to bed to alleviate pain.    Follow Up Recommendations  Supervision/Assistance - 24 hour    Equipment Recommendations  3 in 1 bedside commode    Recommendations for Other Services      Precautions / Restrictions Precautions Precautions: Fall;Back Precaution Comments: verbally reviewed 3/3 Required Braces or Orthoses: Spinal Brace Spinal Brace: Lumbar corset Restrictions Weight Bearing Restrictions: Yes       Mobility Bed Mobility Overal bed mobility: Needs Assistance Bed Mobility: Rolling;Sidelying to Sit Rolling: Supervision Sidelying to sit: Supervision       General bed mobility comments: performed log roll technique with min verbal cues  Transfers Overall transfer level: Needs assistance Equipment used: Rolling walker (2 wheeled) Transfers: Sit to/from  Stand Sit to Stand: Min guard              Balance           Standing balance support: During functional activity;Single extremity supported Standing balance-Leahy Scale: Fair Standing balance comment: steady assistance for balance during clothing management and hygiene         ADL either performed or assessed with clinical judgement   ADL Overall ADL's : Needs assistance/impaired     Grooming: Set up;Sitting;Wash/dry hands        Toilet Transfer: Minimal assistance;Ambulation;RW Toilet Transfer Details (indicate cue type and reason): increased time and vc for safe hand placement Toileting- Clothing Manipulation and Hygiene: Min guard;Sit to/from stand               Vision Baseline Vision/History: No visual deficits Patient Visual Report: No change from baseline            Cognition Arousal/Alertness: Awake/alert Behavior During Therapy: WFL for tasks assessed/performed Overall Cognitive Status: Within Functional Limits for tasks assessed                          Pertinent Vitals/ Pain       Pain Assessment: 0-10 Pain Score: 7  Pain Location: headache Pain Descriptors / Indicators: Aching Pain Intervention(s): Monitored during session;Repositioned         Frequency  Min 3X/week        Progress Toward Goals  OT Goals(current goals can now be found in the care plan section)  Progress towards OT goals: Progressing toward goals  Acute Rehab OT Goals Patient Stated Goal: to go home OT Goal Formulation: With patient/family Time For Goal Achievement: 06/12/18 Potential to Achieve Goals: Good  Plan Discharge plan remains  appropriate       AM-PAC PT "6 Clicks" Daily Activity     Outcome Measure   Help from another person eating meals?: None Help from another person taking care of personal grooming?: A Little Help from another person toileting, which includes using toliet, bedpan, or urinal?: A Little Help from another person  bathing (including washing, rinsing, drying)?: A Lot Help from another person to put on and taking off regular upper body clothing?: A Little Help from another person to put on and taking off regular lower body clothing?: A Lot 6 Click Score: 17    End of Session Equipment Utilized During Treatment: Other (comment);Rolling walker(BSC)  OT Visit Diagnosis: Other abnormalities of gait and mobility (R26.89);Muscle weakness (generalized) (M62.81);Pain Pain - part of body: (head)   Activity Tolerance Patient tolerated treatment well   Patient Left in bed;with call bell/phone within reach;with bed alarm set;with SCD's reapplied   Nurse Communication          Time: 1610-9604 OT Time Calculation (min): 16 min  Charges: OT General Charges $OT Visit: 1 Visit OT Treatments $Self Care/Home Management : 8-22 mins    Gypsy Decant 05/29/2018, 9:49 AM

## 2018-05-29 NOTE — Progress Notes (Signed)
Physical Therapy Treatment Patient Details Name: Lauren Winters MRN: [id number] DOB: 19-Nov-1966 Today's Date: 05/29/2018    History of Present Illness Patient is a 52 y.o. 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 Pt has a PMH including Anxiety, Atypical chest pain, Depression, DMII, Dyspnea, Fibromyalgia, Headache, Heart murmur, HTN, Lupus, Mitral regurgitation, Obesity, Raynaud's phenomenon, Rheumatoid arthritis, Sjogren's disease, Spondylolisthesis of lumbar region, and Tobacco abuse, Spinal cord stimulator insertion (12/09/2017);    PT Comments    Patient seen for activity progression, assist to chair then brought to the hall for ambulation. Tolerated 50 ft of guard slow ambulation then patient again experiencing excruciating headache 8/10. Patient frustrated by slow progress, max encouragement provided.   Anticipate patient will move well once headache controlled, but at this time is significantly limited by pain. Current POC remains appropriate.   Follow Up Recommendations  No PT follow up;Supervision/Assistance - 24 hour     Equipment Recommendations  None recommended by PT    Recommendations for Other Services       Precautions / Restrictions Precautions Precautions: Fall;Back Precaution Booklet Issued: Yes (comment) Precaution Comments: verbally reviewed 3/3 Required Braces or Orthoses: Spinal Brace Spinal Brace: Lumbar corset Restrictions Weight Bearing Restrictions: Yes    Mobility  Bed Mobility Overal bed mobility: Needs Assistance Bed Mobility: Rolling;Sidelying to Sit Rolling: Supervision Sidelying to sit: Supervision       General bed mobility comments: performed log roll technique with min verbal cues  Transfers Overall transfer level: Needs assistance Equipment used: Rolling walker (2 wheeled) Transfers: Sit to/from [facility] Sit to Stand: Min assist Stand pivot transfers: Min assist       General transfer  comment: Min assist during power up, performed from bed and from chair. VCs for positioning at EOB during power up  Ambulation/Gait Ambulation/Gait assistance: Min guard Ambulation Distance (Feet): 50 Feet Assistive device: Rolling walker (2 wheeled) Gait Pattern/deviations: Step-to pattern;Decreased stride length;Shuffle Gait velocity: decreased Gait velocity interpretation: <1.31 ft/sec, indicative of household ambulator General Gait Details: patient remains slow with mobility, guarded throughout. Continues to endorse significant headache in upright position(VCs for increased cadence)   Stairs             Wheelchair Mobility    Modified Rankin (Stroke Patients Only)       Balance Overall balance assessment: Mild deficits observed, not formally tested         Standing balance support: Bilateral upper extremity supported;During functional activity Standing balance-Leahy Scale: Poor Standing balance comment: relaince on UE support                            Cognition Arousal/Alertness: Awake/alert Behavior During Therapy: WFL for tasks assessed/performed Overall Cognitive Status: Within Functional Limits for tasks assessed                                        Exercises      General Comments        Pertinent Vitals/Pain Pain Assessment: 0-10 Pain Score: 8  Pain Location: head ache with mobility Pain Descriptors / Indicators: Aching Pain Intervention(s): Monitored during session    Home Living                      Prior Function            PT  Goals (current goals can now be found in the care plan section) Acute Rehab PT Goals Patient Stated Goal: to go home PT Goal Formulation: With patient/family Time For Goal Achievement: 06/10/18 Potential to Achieve Goals: Good Progress towards PT goals: Progressing toward goals    Frequency    Min 5X/week      PT Plan Current plan remains appropriate     Co-evaluation              AM-PAC PT "6 Clicks" Daily Activity  Outcome Measure  Difficulty turning over in bed (including adjusting bedclothes, sheets and blankets)?: Unable Difficulty moving from lying on back to sitting on the side of the bed? : Unable Difficulty sitting down on and standing up from a chair with arms (e.g., wheelchair, bedside commode, etc,.)?: Unable Help needed moving to and from a bed to chair (including a wheelchair)?: A Little Help needed walking in hospital room?: A Little Help needed climbing 3-5 steps with a railing? : A Lot 6 Click Score: 11    End of Session Equipment Utilized During Treatment: Gait belt;Back brace Activity Tolerance: Patient limited by pain;Treatment limited secondary to medical complications (Comment) Patient left: in chair;with call bell/phone within reach Nurse Communication: Mobility status PT Visit Diagnosis: Difficulty in walking, not elsewhere classified (R26.2)     Time: 1856-3149 PT Time Calculation (min) (ACUTE ONLY): 21 min  Charges:  $Gait Training: 8-22 mins                    G Codes:       Lauren Winters, PT DPT  Board Certified Neurologic Specialist [phone]    Lauren Winters 05/29/2018, 11:37 AM

## 2018-05-29 NOTE — Progress Notes (Signed)
Subjective: Patient reports headache and dizziness when up, back appropriately sore, some tingling in toes  Objective: Vital signs in last 24 hours: Temp:  [97.8 F (36.6 C)-98.5 F (36.9 C)] 97.9 F (36.6 C) (06/03 0414) Pulse Rate:  [57-74] 67 (06/03 0414) Resp:  [14-18] 16 (06/03 0414) BP: (93-127)/(46-63) 110/54 (06/03 0414) SpO2:  [96 %-97 %] 96 % (06/03 0414)  Intake/Output from previous day: 06/02 0701 - 06/03 0700 In: 120 [P.O.:120] Out: 650 [Urine:650] Intake/Output this shift: No intake/output data recorded.  Neurologic: Grossly normal, wound CDI  Lab Results: Lab Results  Component Value Date   WBC 7.5 05/27/2018   HGB 11.4 (L) 05/27/2018   HCT 36.1 05/27/2018   MCV 81.3 05/27/2018   PLT 299 05/27/2018   Lab Results  Component Value Date   INR 0.99 05/17/2018   BMET Lab Results  Component Value Date   NA 137 05/27/2018   K 4.0 05/27/2018   CL 104 05/27/2018   CO2 26 05/27/2018   GLUCOSE 132 (H) 05/27/2018   BUN 10 05/27/2018   CREATININE 1.42 (H) 05/27/2018   CALCIUM 8.4 (L) 05/27/2018    Studies/Results: No results found.  Assessment/Plan: continue to mobilize, continue pain control  Estimated body mass index is 35.51 kg/m as calculated from the following:   Height as of this encounter: 5\' 6"  (1.676 m).   Weight as of this encounter: 99.8 kg (220 lb).    LOS: 3 days    Jadin Kagel S 05/29/2018, 7:48 AM

## 2018-05-30 LAB — GLUCOSE, CAPILLARY: Glucose-Capillary: 115 mg/dL — ABNORMAL HIGH (ref 65–99)

## 2018-05-30 MED ORDER — HYDROCODONE-ACETAMINOPHEN 7.5-325 MG PO TABS: 1.0000 | ORAL_TABLET | ORAL | 0 refills | Status: DC | PRN

## 2018-05-30 NOTE — Discharge Summary (Signed)
Physician Discharge Summary  Patient ID: Lauren Winters MRN: [id number] DOB/AGE: [birth year] 52 y.o.  Admit date: 05/26/2018 Discharge date: 05/30/2018  Admission Diagnoses: postlaminectomy spondylolisthesis    Discharge Diagnoses: same   Discharged Condition: stable  Hospital Course: The patient was admitted on 05/26/2018 and taken to the operating room where the patient underwent L3-L5 fusion. She immediately noted decreased use of the right leg and stat CT scan was ordered which look quite good. She then quickly had a return of function. This is likely a conversion reaction. The patient tolerated the procedure well and was taken to the recovery room and then to the floor in stable condition. The hospital course was routine. There were no complications. The wound remained clean dry and intact. Pt had appropriate back soreness. No complaints of leg pain or new N/T/W. The patient remained afebrile with stable vital signs, and tolerated a regular diet. The patient continued to increase activities, and pain was well controlled with oral pain medications.   Consults: None  Significant Diagnostic Studies:  Results for orders placed or performed during the hospital encounter of 05/26/18  Glucose, capillary  Result Value Ref Range   Glucose-Capillary 162 (H) 65 - 99 mg/dL   Comment 1 G    Comment 2 Document in Chart   Glucose, capillary  Result Value Ref Range   Glucose-Capillary 140 (H) 65 - 99 mg/dL  Glucose, capillary  Result Value Ref Range   Glucose-Capillary 117 (H) 65 - 99 mg/dL   Comment 1 Notify RN   Glucose, capillary  Result Value Ref Range   Glucose-Capillary 187 (H) 65 - 99 mg/dL   Comment 1 Notify RN   Glucose, capillary  Result Value Ref Range   Glucose-Capillary 125 (H) 65 - 99 mg/dL  CBC  Result Value Ref Range   WBC 7.5 4.0 - 10.5 K/uL   RBC 4.44 3.87 - 5.11 MIL/uL   Hemoglobin 11.4 (L) 12.0 - 15.0 g/dL   HCT 36.1 36.0 - 46.0 %   MCV 81.3 78.0 - 100.0 fL   MCH  25.7 (L) 26.0 - 34.0 pg   MCHC 31.6 30.0 - 36.0 g/dL   RDW 14.3 11.5 - 15.5 %   Platelets 299 150 - 400 K/uL  Basic metabolic panel  Result Value Ref Range   Sodium 137 135 - 145 mmol/L   Potassium 4.0 3.5 - 5.1 mmol/L   Chloride 104 101 - 111 mmol/L   CO2 26 22 - 32 mmol/L   Glucose, Bld 132 (H) 65 - 99 mg/dL   BUN 10 6 - 20 mg/dL   Creatinine, Ser 1.42 (H) 0.44 - 1.00 mg/dL   Calcium 8.4 (L) 8.9 - 10.3 mg/dL   GFR calc non Af Amer 41 (L) >60 mL/min   GFR calc Af Amer 48 (L) >60 mL/min   Anion gap 7 5 - 15  Glucose, capillary  Result Value Ref Range   Glucose-Capillary 124 (H) 65 - 99 mg/dL   Comment 1 Notify RN   Glucose, capillary  Result Value Ref Range   Glucose-Capillary 74 65 - 99 mg/dL   Comment 1 Notify RN   Glucose, capillary  Result Value Ref Range   Glucose-Capillary 148 (H) 65 - 99 mg/dL   Comment 1 Notify RN    Comment 2 Document in Chart   Glucose, capillary  Result Value Ref Range   Glucose-Capillary 123 (H) 65 - 99 mg/dL  Glucose, capillary  Result Value Ref Range   Glucose-Capillary  115 (H) 65 - 99 mg/dL  Glucose, capillary  Result Value Ref Range   Glucose-Capillary 121 (H) 65 - 99 mg/dL   Comment 1 Notify RN    Comment 2 Document in Chart   Glucose, capillary  Result Value Ref Range   Glucose-Capillary 124 (H) 65 - 99 mg/dL   Comment 1 Notify RN    Comment 2 Document in Chart   CBC  Result Value Ref Range   WBC 4.5 4.0 - 10.5 K/uL   RBC 3.73 (L) 3.87 - 5.11 MIL/uL   Hemoglobin 10.0 (L) 12.0 - 15.0 g/dL   HCT 30.6 (L) 36.0 - 46.0 %   MCV 82.0 78.0 - 100.0 fL   MCH 26.8 26.0 - 34.0 pg   MCHC 32.7 30.0 - 36.0 g/dL   RDW 14.6 11.5 - 15.5 %   Platelets 237 150 - 400 K/uL  Basic metabolic panel  Result Value Ref Range   Sodium 143 135 - 145 mmol/L   Potassium 3.7 3.5 - 5.1 mmol/L   Chloride 111 101 - 111 mmol/L   CO2 24 22 - 32 mmol/L   Glucose, Bld 116 (H) 65 - 99 mg/dL   BUN 9 6 - 20 mg/dL   Creatinine, Ser 1.08 (H) 0.44 - 1.00 mg/dL    Calcium 8.3 (L) 8.9 - 10.3 mg/dL   GFR calc non Af Amer 57 (L) >60 mL/min   GFR calc Af Amer >60 >60 mL/min   Anion gap 8 5 - 15  Glucose, capillary  Result Value Ref Range   Glucose-Capillary 149 (H) 65 - 99 mg/dL  Glucose, capillary  Result Value Ref Range   Glucose-Capillary 119 (H) 65 - 99 mg/dL  Glucose, capillary  Result Value Ref Range   Glucose-Capillary 105 (H) 65 - 99 mg/dL   Comment 1 Notify RN    Comment 2 Document in Chart   Glucose, capillary  Result Value Ref Range   Glucose-Capillary 115 (H) 65 - 99 mg/dL   Comment 1 Notify RN    Comment 2 Document in Chart     Dg Lumbar Spine 2-3 Views  Result Date: 05/26/2018 CLINICAL DATA:  Posterior fusion EXAM: LUMBAR SPINE - 2-3 VIEW; DG C-ARM 61-120 MIN COMPARISON:  Lumbar radiographs February 28, 2018 FLUOROSCOPY TIME:  1 minutes 16 seconds; 2 acquired images FINDINGS: Frontal and lateral views were obtained. There is posterior screw and plate fixation at L3, L4, and L5 bilaterally with pedicle screw tips in the respective vertebral bodies. No fracture or spondylolisthesis. There is moderate disc space narrowing at L3-4. Other disc spaces appear unremarkable. IMPRESSION: Bilateral posterior screw and plate fixation at L3, L4, and L5 with support hardware intact. Moderate disc space narrowing at L3-4 noted. No fracture or spondylolisthesis. Electronically Signed   By: Lowella Grip III M.D.   On: 05/26/2018 13:04   Ct Lumbar Spine Wo Contrast  Result Date: 05/26/2018 CLINICAL DATA:  Unable to feel right leg following surgery. Lumbar fusion today. EXAM: CT LUMBAR SPINE WITHOUT CONTRAST TECHNIQUE: Multidetector CT imaging of the lumbar spine was performed without intravenous contrast administration. Multiplanar CT image reconstructions were also generated. COMPARISON:  Intraoperative radiographs. MRI of the lumbar spine 04/07/2018. FINDINGS: Segmentation: 5 non rib-bearing lumbar type vertebral bodies are present. The lowest fully  formed vertebral body is L5. Alignment: Slight anterolisthesis at L4-5 is stable. AP alignment is otherwise anatomic. Mild leftward curvature is present at L2. Mild rightward curvature is present at L4-5. Vertebrae: Vertebral body heights  are normal. Bilateral pedicle screws are in place at L3, L4, and L5. Pedicle screws are Holy contained within the bone. Paraspinal and other soft tissues: Subcentimeter para-aortic lymph nodes are again noted. No acute abnormalities are present. There is no significant adenopathy. Postsurgical changes are noted within the paraspinous musculature. Disc levels: L1-2: A mild broad-based disc protrusion is present. There is no significant stenosis or change. L2-3: A broad-based disc protrusion is present. Mild right subarticular narrowing is present. Mild right foraminal narrowing is stable. L3-4: Posterior canal is decompressed. Previous laminectomy and right foraminotomy are noted. Minimal right foraminal narrowing is stable. L4-5: A broad-based disc protrusion is present. Laminectomy is noted. Mild foraminal narrowing remains bilaterally. Lateral disc protrusions remain. L5-S1: Mild facet hypertrophy is present bilaterally. No focal protrusion or stenosis is present. IMPRESSION: 1. Interval posterior fusion hardware at L3-4 and L4-5. Pedicle screws are fully contained within the bone. No complicating features present. 2. Previous laminectomies at L3 and L4. There may still be some disc material in the foramen. 3. Posterior canal and medial foramen are decompressed. 4. Mild left foraminal narrowing at L3-4 is stable. 5. Mild residual foraminal narrowing bilaterally at L4-5. 6. These results were called by telephone at the time of interpretation on 05/26/2018 at 2:01 pm to Dr. Sherley Bounds , who verbally acknowledged these results. Electronically Signed   By: San Morelle M.D.   On: 05/26/2018 14:03   Dg C-arm 1-60 Min  Result Date: 05/26/2018 CLINICAL DATA:  Posterior  fusion EXAM: LUMBAR SPINE - 2-3 VIEW; DG C-ARM 61-120 MIN COMPARISON:  Lumbar radiographs February 28, 2018 FLUOROSCOPY TIME:  1 minutes 16 seconds; 2 acquired images FINDINGS: Frontal and lateral views were obtained. There is posterior screw and plate fixation at L3, L4, and L5 bilaterally with pedicle screw tips in the respective vertebral bodies. No fracture or spondylolisthesis. There is moderate disc space narrowing at L3-4. Other disc spaces appear unremarkable. IMPRESSION: Bilateral posterior screw and plate fixation at L3, L4, and L5 with support hardware intact. Moderate disc space narrowing at L3-4 noted. No fracture or spondylolisthesis. Electronically Signed   By: Lowella Grip III M.D.   On: 05/26/2018 13:04    Antibiotics:  Anti-infectives (From admission, onward)   Start     Dose/Rate Route Frequency Ordered Stop   05/26/18 2200  hydroxychloroquine (PLAQUENIL) tablet 200 mg     200 mg Oral 2 times daily 05/26/18 1805     05/26/18 2100  vancomycin (VANCOCIN) 1,250 mg in sodium chloride 0.9 % 250 mL IVPB     1,250 mg 166.7 mL/hr over 90 Minutes Intravenous  Once 05/26/18 1818 05/26/18 2320   05/26/18 1231  vancomycin (VANCOCIN) powder  Status:  Discontinued       As needed 05/26/18 1232 05/26/18 1256   05/26/18 1118  bacitracin 50,000 Units in sodium chloride 0.9 % 500 mL irrigation  Status:  Discontinued       As needed 05/26/18 1119 05/26/18 1256   05/26/18 0700  vancomycin (VANCOCIN) 1,500 mg in sodium chloride 0.9 % 500 mL IVPB     1,500 mg 250 mL/hr over 120 Minutes Intravenous To Short Stay 05/25/18 0856 05/26/18 1047      Discharge Exam: Blood pressure (!) 174/66, pulse 60, temperature 98 F (36.7 C), temperature source Oral, resp. rate 18, height 5\' 6"  (1.676 m), weight 99.8 kg (220 lb), SpO2 98 %. Neurologic: Grossly normal with normal strength and muscle tone Dressing dry  Discharge Medications:   Allergies  as of 05/30/2018      Reactions   Pseudoephedrine Hcl  Shortness Of Breath, Other (See Comments)   Room starts spinning, can't breath   Clindamycin/lincomycin Hives   Ancef [cefazolin] Other (See Comments)   Unknown   Prednisone Other (See Comments)   Chest pain w/ cortisone shots or prednisone pills Elevated blood pressure   Adhesive [tape] Rash, Other (See Comments)   Sensitivity to certain band aids and adhesives - burns skin, makes skin raw   Other Rash, Other (See Comments)   Sensitivity to certain band aids and adhesives - burns skin, makes skin raw   Sudafed [pseudoephedrine Hcl] Other (See Comments)   Dizziness       Medication List    TAKE these medications   amLODipine 10 MG tablet Commonly known as:  NORVASC Take 1 tablet once daily for blood pressure. What changed:    how much to take  how to take this  when to take this  additional instructions   buPROPion 150 MG 24 hr tablet Commonly known as:  WELLBUTRIN XL Take 1 tablet by mouth once daily for anxiety and depression. What changed:    how much to take  how to take this  when to take this  additional instructions   clobetasol cream 0.05 % Commonly known as:  TEMOVATE Apply 1 application topically 2 (two) times daily as needed (for lupis rash).   cloNIDine 0.1 MG tablet Commonly known as:  CATAPRES Take 1 tablet by mouth twice daily for blood pressure. What changed:    how much to take  how to take this  when to take this  additional instructions   cyclobenzaprine 10 MG tablet Commonly known as:  FLEXERIL Take 1 tablet (10 mg total) by mouth 3 (three) times daily as needed for muscle spasms.   escitalopram 20 MG tablet Commonly known as:  LEXAPRO Take 20 mg by mouth daily.   folic acid 1 MG tablet Commonly known as:  FOLVITE Take 2 tablets (2 mg total) by mouth daily.   gabapentin 300 MG capsule Commonly known as:  NEURONTIN Take 600 mg by mouth 3 (three) times daily as needed (for pain).   Halcinonide 0.1 % Oint Apply 1  application topically 2 (two) times daily as needed (for lupis rash).   HYDROcodone-acetaminophen 7.5-325 MG tablet Commonly known as:  NORCO Take 1 tablet by mouth every 4 (four) hours as needed for moderate pain.   hydroxychloroquine 200 MG tablet Commonly known as:  PLAQUENIL Take 1 tablet (200 mg total) by mouth 2 (two) times daily.   hydrOXYzine 25 MG tablet Commonly known as:  ATARAX/VISTARIL Take 1 tablet (25 mg total) by mouth 2 (two) times daily as needed for anxiety.   Insulin Glargine 100 UNIT/ML Solostar Pen Commonly known as:  LANTUS Inject 8 Units into the skin every evening.   metFORMIN 500 MG 24 hr tablet Commonly known as:  GLUCOPHAGE-XR Take 2 tablets every morning with food for diabetes. What changed:    how much to take  how to take this  when to take this  additional instructions   methotrexate 2.5 MG tablet Commonly known as:  RHEUMATREX Take 8 tablets (20 mg total) by mouth once a week. Caution:Chemotherapy. Protect from light. What changed:    when to take this  additional instructions   metoprolol tartrate 100 MG tablet Commonly known as:  LOPRESSOR Take 1 tablet by mouth twice daily for blood pressure. What changed:  how much to take  how to take this  when to take this  additional instructions   Pen Needles 31G X 6 MM Misc Use with insulin as directed.   pilocarpine 5 MG tablet Commonly known as:  SALAGEN Take 5 mg by mouth daily as needed (for dry mouth).   rosuvastatin 5 MG tablet Commonly known as:  CRESTOR Take 1 tablet by mouth at bedtime for cholesterol. What changed:    how much to take  how to take this  when to take this  additional instructions   tacrolimus 0.1 % ointment Commonly known as:  PROTOPIC Apply 1 application topically 2 (two) times daily as needed (for lupis rash).   triamcinolone 0.1 % cream : eucerin Crea Apply 1 application topically 2 (two) times daily as needed for rash or  irritation.   valsartan-hydrochlorothiazide 320-25 MG tablet Commonly known as:  DIOVAN-HCT Take 1 tablet by mouth once daily for blood pressure. What changed:    how much to take  how to take this  when to take this  additional instructions   Vitamin D (Ergocalciferol) 50000 units Caps capsule Commonly known as:  DRISDOL Take 1 capsule (50,000 Units total) by mouth 2 (two) times a week.            Durable Medical Equipment  (From admission, onward)        Start     Ordered   05/26/18 1806  DME Walker rolling  Once    Question:  Patient needs a walker to treat with the following condition  Answer:  S/P lumbar fusion   05/26/18 1805   05/26/18 1806  DME 3 n 1  Once     05/26/18 1805      Disposition: home   Final Dx: lumbar fusion L3-L5  Discharge Instructions    Call MD for:  difficulty breathing, headache or visual disturbances   Complete by:  As directed    Call MD for:  persistant nausea and vomiting   Complete by:  As directed    Call MD for:  redness, tenderness, or signs of infection (pain, swelling, redness, odor or green/yellow discharge around incision site)   Complete by:  As directed    Call MD for:  severe uncontrolled pain   Complete by:  As directed    Call MD for:  temperature >100.4   Complete by:  As directed    Diet - low sodium heart healthy   Complete by:  As directed    Increase activity slowly   Complete by:  As directed    Remove dressing in 48 hours   Complete by:  As directed       Follow-up Information    Eustace Moore, MD. Schedule an appointment as soon as possible for a visit in 2 week(s).   Specialty:  Neurosurgery Contact information: [address] [place] [address] [place] [address] [phone]            Signed: Eustace Moore 05/30/2018, 8:16 AM

## 2018-05-30 NOTE — Progress Notes (Signed)
Patient with discharge order at this time, Awaiting for transportation form spouse.  Hav, RN

## 2018-05-30 NOTE — Care Management Note (Signed)
Case Management Note  Patient Details  Name: Lauren Winters MRN: [id number] Date of Birth: Jun 15, 1966  Subjective/Objective:                    Action/Plan: Pt discharging home with self care. Pt has all needed DME. Pt has PCP, insurance and transportation home.   Expected Discharge Date:  05/30/18               Expected Discharge Plan:  Home/Self Care  In-House Referral:     Discharge planning Services  CM Consult  Post Acute Care Choice:    Choice offered to:     DME Arranged:    DME Agency:     HH Arranged:    HH Agency:     Status of Service:  Completed, signed off  If discussed at [employer] of Stay Meetings, dates discussed:    Additional Comments:  Pollie Friar, RN 05/30/2018, 10:10 AM

## 2018-05-30 NOTE — Progress Notes (Signed)
Discharge instructions reviewed with patient. RX given.  Patient advise to check b/p prior to taking b/p meds since she is on narcotics for pain management. Pt verbalized understanding. Transport home by family.  Ave Filter, RN

## 2018-05-30 NOTE — Progress Notes (Signed)
Physical Therapy Treatment Patient Details Name: Lauren Winters MRN: [id number] DOB: 21-Sep-1966 Today's Date: 05/30/2018    History of Present Illness Patient is a 52 y.o. 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 Pt has a PMH including Anxiety, Atypical chest pain, Depression, DMII, Dyspnea, Fibromyalgia, Headache, Heart murmur, HTN, Lupus, Mitral regurgitation, Obesity, Raynaud's phenomenon, Rheumatoid arthritis, Sjogren's disease, Spondylolisthesis of lumbar region, and Tobacco abuse, Spinal cord stimulator insertion (12/09/2017);    PT Comments    Patient continues to make progress toward PT goals. Current plan remains appropriate.    Follow Up Recommendations  No PT follow up;Supervision/Assistance - 24 hour     Equipment Recommendations  None recommended by PT    Recommendations for Other Services       Precautions / Restrictions Precautions Precautions: Fall;Back Precaution Comments: pt able to recall 3/3 precautions  Required Braces or Orthoses: Spinal Brace Spinal Brace: Lumbar corset    Mobility  Bed Mobility Overal bed mobility: Modified Independent Bed Mobility: Rolling;Sidelying to Sit;Sit to Sidelying           General bed mobility comments: increased time and effort; use of rail   Transfers Overall transfer level: Needs assistance Equipment used: Rolling walker (2 wheeled) Transfers: Sit to/from Stand Sit to Stand: Min guard         General transfer comment: min guard for safety  Ambulation/Gait Ambulation/Gait assistance: Min guard Ambulation Distance (Feet): 100 Feet Assistive device: Rolling walker (2 wheeled) Gait Pattern/deviations: Decreased stride length;Step-through pattern;Decreased step length - right;Decreased step length - left Gait velocity: decreased   General Gait Details: cues for posture and stride length(VCs for increased cadence)   Stairs         General stair comments: verbally reviewed sequencing  and safety for stair negotiation    Wheelchair Mobility    Modified Rankin (Stroke Patients Only)       Balance Overall balance assessment: Mild deficits observed, not formally tested         Standing balance support: Bilateral upper extremity supported;During functional activity Standing balance-Leahy Scale: Poor Standing balance comment: relaince on UE support                            Cognition Arousal/Alertness: Awake/alert Behavior During Therapy: WFL for tasks assessed/performed Overall Cognitive Status: Within Functional Limits for tasks assessed                                        Exercises      General Comments General comments (skin integrity, edema, etc.): pt educated on ambulation schedule and positioning       Pertinent Vitals/Pain Pain Assessment: Faces Faces Pain Scale: Hurts even more Pain Location: head ache with mobility Pain Descriptors / Indicators: Aching Pain Intervention(s): Limited activity within patient's tolerance;Monitored during session;RN gave pain meds during session    Home Living                      Prior Function            PT Goals (current goals can now be found in the care plan section) Acute Rehab PT Goals Patient Stated Goal: to go home PT Goal Formulation: With patient/family Time For Goal Achievement: 06/10/18 Potential to Achieve Goals: Good Progress towards PT goals: Progressing toward goals    Frequency  Min 5X/week      PT Plan Current plan remains appropriate    Co-evaluation              AM-PAC PT "6 Clicks" Daily Activity  Outcome Measure  Difficulty turning over in bed (including adjusting bedclothes, sheets and blankets)?: Unable Difficulty moving from lying on back to sitting on the side of the bed? : Unable Difficulty sitting down on and standing up from a chair with arms (e.g., wheelchair, bedside commode, etc,.)?: Unable Help needed moving to  and from a bed to chair (including a wheelchair)?: A Little Help needed walking in hospital room?: A Little Help needed climbing 3-5 steps with a railing? : A Lot 6 Click Score: 11    End of Session Equipment Utilized During Treatment: Gait belt;Back brace Activity Tolerance: Patient limited by pain Patient left: with call bell/phone within reach;in bed Nurse Communication: Mobility status PT Visit Diagnosis: Difficulty in walking, not elsewhere classified (R26.2)     Time: 0488-8916 PT Time Calculation (min) (ACUTE ONLY): 16 min  Charges:  $Gait Training: 8-22 mins                    G Codes:       Earney Navy, PTA Pager: [phone]     Darliss Cheney 05/30/2018, 9:19 AM

## 2018-05-31 MED FILL — Thrombin For Soln 5000 Unit: CUTANEOUS | Qty: 5000 | Status: AC

## 2018-06-01 ENCOUNTER — Ambulatory Visit: Payer: BLUE CROSS/BLUE SHIELD | Admitting: Psychology

## 2018-06-04 ENCOUNTER — Other Ambulatory Visit: Payer: Self-pay | Admitting: Rheumatology

## 2018-06-04 DIAGNOSIS — E559 Vitamin D deficiency, unspecified: Secondary | ICD-10-CM

## 2018-06-05 NOTE — Telephone Encounter (Signed)
Left message to advise patient will need labs before we can refill.

## 2018-06-06 ENCOUNTER — Ambulatory Visit: Payer: BLUE CROSS/BLUE SHIELD | Admitting: Rheumatology

## 2018-06-22 ENCOUNTER — Ambulatory Visit: Payer: BLUE CROSS/BLUE SHIELD | Admitting: Psychology

## 2018-07-04 NOTE — Progress Notes (Signed)
Office Visit Note  Patient: Lauren Winters             Date of Birth: 07/26/1966           MRN: [id number]             PCP: Pleas Koch, NP Referring: Pleas Koch, NP Visit Date: 07/18/2018 Occupation: @GUAROCC @    Subjective:  Generalized pain   History of Present Illness: Lauren Winters is a 52 y.o. female with systemic lupus erythematosus, Sjogren's syndrome, fibromyalgia, and osteoarthritis.  Patient is injecting methotrexate 8 tablets once weekly, taking folic acid 2 mg daily, and Plaquenil 200 mg p.o. twice daily.  Patient reports that her fibromyalgia has been flaring more frequently.  She states that she had a spinal fusion in June 2019 which is led to increased generalized muscle aches and muscle tenderness.  She states she continues to have chronic pain in her lower back.  She states she is also very tender in bilateral medial epicondylar region.  She is having trochanteric bursitis bilaterally.  She is also having increased pain in bilateral knee joints but denies any joint swelling.  She would like to schedule Hyalgan gel injections for bilateral knee joints.  She continues to have chronic fatigue.   She denies any recent lupus flares.  She reports that she does have mouth sores at this time as well as healing nasal sores.  She denies any recent rashes or photosensitivity.  She denies any swollen lymph nodes.  She continues to have fatigue but attributes it to fibromyalgia.  She reports that she was previously taking vitamin D 50,000 units once weekly but ran out several weeks ago.    Activities of Daily Living:  Patient reports morning stiffness for 5 hours.   Patient Reports nocturnal pain.  Difficulty dressing/grooming: Reports Difficulty climbing stairs: Reports Difficulty getting out of chair: Reports Difficulty using hands for taps, buttons, cutlery, and/or writing: Reports   Review of Systems  Constitutional: Positive for fatigue.  HENT: Positive  for mouth sores and mouth dryness. Negative for nose dryness.   Eyes: Positive for dryness. Negative for pain and visual disturbance.  Respiratory: Negative for cough, hemoptysis, shortness of breath and difficulty breathing.   Cardiovascular: Negative for chest pain, palpitations, hypertension and swelling in legs/feet.  Gastrointestinal: Positive for diarrhea. Negative for blood in stool and constipation.  Endocrine: Negative for increased urination.  Genitourinary: Negative for painful urination.  Musculoskeletal: Positive for arthralgias, joint pain and morning stiffness. Negative for joint swelling, myalgias, muscle weakness, muscle tenderness and myalgias.  Skin: Positive for rash and hair loss. Negative for color change, pallor, nodules/bumps, skin tightness, ulcers and sensitivity to sunlight.  Allergic/Immunologic: Negative for susceptible to infections.  Neurological: Negative for dizziness.  Hematological: Negative for bruising/bleeding tendency and swollen glands.  Psychiatric/Behavioral: Positive for sleep disturbance. Negative for depressed mood. The patient is not nervous/anxious.     PMFS History:  Patient Active Problem List   Diagnosis Date Noted  . S/P lumbar spinal fusion 05/26/2018  . High risk medication use 02/03/2018  . Wound infection after surgery 01/11/2018  . S/P lumbar laminectomy 12/09/2017  . Paresthesia 05/11/2017  . HNP (herniated nucleus pulposus), lumbar 03/29/2017  . Chronic right shoulder pain 02/18/2017  . Primary osteoarthritis of both hands 12/22/2016  . Primary osteoarthritis of both knees 12/22/2016  . Spondylosis of lumbar region without myelopathy or radiculopathy 12/22/2016  . Fibromyalgia 12/22/2016  . Preventative health care 04/19/2016  .  Chronic back pain 12/17/2015  . Hyperlipidemia 08/29/2015  . Other organ or system involvement in systemic lupus erythematosus ([place]) 08/04/2015  . Rheumatoid factor positive 08/04/2015  . Sjogren's  syndrome ([place]) 08/04/2015  . Mitral regurgitation   . Tobacco abuse   . Atypical chest pain   . Obesity (BMI 30-39.9)   . Anxiety   . Hypertension   . Diabetes mellitus without complication ([place])   . Clinical depression 05/23/2014    Past Medical History:  Diagnosis Date  . Anxiety   . Atypical chest pain    a. ETT 05/2014: normal stress test without evidence of myocardial ischemia or chest pain at peak stress  . Complication of anesthesia    migraine headache  . Depression   . Diabetes mellitus without complication ([place]) metformin   dx 2015  . Dyspnea    on exertion  . Fibromyalgia   . Headache   . Heart murmur    told back in 2007, but no mention with 2016 cardiac w/u  . Hypertension   . Lupus (systemic lupus erythematosus) ([place])   . Mitral regurgitation    a. 05/2014 echo: EF >55%, nl LV/RV sys fxn, mild MR/AR, no valvular stenosis   . Obesity (BMI 30-39.9)   . Raynaud's phenomenon   . Rheumatoid arthritis ([place])   . Sjogren's disease ([place])   . Spondylolisthesis of lumbar region   . Tobacco abuse    a. ongoing; b. ongoing SOB    Family History  Problem Relation Age of Onset  . Hypertension Mother   . Valvular heart disease Mother   . CAD Father 76       CABG x 4  . Liver cancer Father 32       passed  . CAD Paternal Aunt        CABG  . Breast cancer Paternal Aunt   . CAD Paternal Aunt        CABG  . Breast cancer Paternal Aunt   . CAD Paternal Aunt        CABG  . Breast cancer Paternal Aunt   . CAD Paternal Aunt        CABG  . Breast cancer Paternal Aunt   . CAD Paternal Aunt        CABG  . Breast cancer Paternal Aunt   . CAD Paternal Uncle        MI s/p CABG  . Stroke Maternal Grandmother   . Stroke Maternal Grandfather   . CAD Maternal Grandfather    Past Surgical History:  Procedure Laterality Date  . ABDOMINAL HYSTERECTOMY    . BACK SURGERY    . CESAREAN SECTION     x3  . LUMBAR LAMINECTOMY    . LUMBAR LAMINECTOMY/DECOMPRESSION  MICRODISCECTOMY N/A 03/29/2017   Procedure: Re operative Right Lumbar Three-Four Laminectomy and discectomy with Left Lumbar Three-Four, Bilateral Lumbar Four-Five Laminectomy for decompression;  Surgeon: Kevan Ny Ditty, MD;  Location: [facility];  Service: Neurosurgery;  Laterality: N/A;  . SPINAL CORD STIMULATOR INSERTION  12/09/2017   Procedure: LUMBAR SPINAL CORD STIMULATOR IMPLANT;  Surgeon: Eustace Moore, MD;  Location: [facility];  Service: Neurosurgery;;  . SPINAL CORD STIMULATOR REMOVAL N/A 01/11/2018   Procedure: LUMBAR SPINAL CORD STIMULATOR REMOVAL;  Surgeon: Eustace Moore, MD;  Location: [facility];  Service: Neurosurgery;  Laterality: N/A;  . TUBAL LIGATION     Social History   Social History Narrative   Married.   3 children.  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      Objective: Vital Signs: BP (!) 161/83 (BP Location: Left Arm, Patient Position: Sitting, Cuff Size: Normal)   Pulse 84   Resp 14   Ht 5\' 6"  (1.676 m)   Wt 225 lb (102.1 kg)   BMI 36.32 kg/m    Physical Exam  Constitutional: She is oriented to person, place, and time. She appears well-developed and well-nourished.  HENT:  Head: Normocephalic and atraumatic.  No parotid swelling.   Eyes: Conjunctivae and EOM are normal.  Neck: Normal range of motion.  Cardiovascular: Normal rate, regular rhythm, normal heart sounds and intact distal pulses.  Pulmonary/Chest: Effort normal and breath sounds normal.  Abdominal: Soft. Bowel sounds are normal.  Lymphadenopathy:    She has no cervical adenopathy.  Neurological: She is alert and oriented to person, place, and time.  Skin: Skin is warm and dry. Capillary refill takes less than 2 seconds.  Psychiatric: She has a normal mood and affect. Her behavior is normal.  Nursing note and vitals reviewed.    Musculoskeletal Exam: Generalized hyperalgesia on exam. C-spine limited ROM with discomfort.  Lumbar spine limited ROM.  Shoulder  joints, elbow joints, wrist joints, MCPs, PIPs, and DIPs good ROM with no synovitis.  Tenderness in bilateral medial epicondylar region. Hip joints, knee joints, ankle joints, MTPs, PIPs, and DIPs good ROM with no synovitis.  No warmth or effusion of knee joints. Tenderness of bilateral trochanteric bursa.   PIP and DIP synovial thickening of bilateral feet.  CDAI Exam: CDAI Homunculus Exam:   Joint Counts:  CDAI Tender Joint count: 0 CDAI Swollen Joint count: 0  Global Assessments:  Patient Global Assessment: 3 Provider Global Assessment: 3  CDAI Calculated Score: 6    Investigation: No additional findings. CBC    Component Value Date/Time   WBC 4.5 05/29/2018 0753   RBC 3.73 (L) 05/29/2018 0753   HGB 10.0 (L) 05/29/2018 0753   HCT 30.6 (L) 05/29/2018 0753   PLT 237 05/29/2018 0753   MCV 82.0 05/29/2018 0753   MCH 26.8 05/29/2018 0753   MCHC 32.7 05/29/2018 0753   RDW 14.6 05/29/2018 0753   LYMPHSABS 1.5 05/17/2018 1333   MONOABS 0.5 05/17/2018 1333   EOSABS 0.1 05/17/2018 1333   BASOSABS 0.1 05/17/2018 1333   CMP Latest Ref Rng & Units 05/29/2018 05/27/2018 05/17/2018  Glucose 65 - 99 mg/dL 116(H) 132(H) 164(H)  BUN 6 - 20 mg/dL 9 10 16   Creatinine 0.44 - 1.00 mg/dL 1.08(H) 1.42(H) 1.20(H)  Sodium 135 - 145 mmol/L 143 137 138  Potassium 3.5 - 5.1 mmol/L 3.7 4.0 3.2(L)  Chloride 101 - 111 mmol/L 111 104 99(L)  CO2 22 - 32 mmol/L 24 26 28   Calcium 8.9 - 10.3 mg/dL 8.3(L) 8.4(L) 9.8  Total Protein 6.0 - 8.3 g/dL - - -  Total Bilirubin 0.2 - 1.2 mg/dL - - -  Alkaline Phos 39 - 117 U/L - - -  AST 0 - 37 U/L - - -  ALT 0 - 35 U/L - - -     Imaging: No results found.  Speciality Comments: No specialty comments available.    Procedures:  No procedures performed Allergies: Pseudoephedrine hcl; Clindamycin/lincomycin; Ancef [cefazolin]; Prednisone; Adhesive [tape]; Other; and Sudafed [pseudoephedrine hcl]   Assessment / Plan:     Visit Diagnoses: Other organ or  system involvement in systemic lupus erythematosus ([place]) -She has not had any recent lupus flares.  She has no synovitis on exam.  She has not had any recent rashes or photosensitivity.  She continues to have chronic fatigue but attributes it to fibromyalgia.  She has recurrent oral and nasal ulcerations.  No cervical lymphadenopathy was palpated.  Complement levels and double-stranded DNA were within normal limits in February 2019.  We will recheck her autoimmune labs with her next lab work on October 2019. Future orders will be placed. She is clinically doing well on methotrexate 8 tablets by mouth once weekly, folic acid 2 mg daily, Plaquenil 200 mg 1 tablet twice daily.  Her insurance is no longer covering methotrexate subcutaneous injections.  Three samples for Otrexup 20 mg was provided to the patient.  When she runs out of the samples we will send in a refill of methotrexate tablets by mouth once weekly.  She was also provided a prescription for Zofran 4 mg every 8 hours as needed for nausea.  She is been having increased nausea following day after taking her methotrexate tablets.  A 30-day supply of Plaquenil sent to the pharmacy.  She was advised to get her Plaquenil eye exam ASAP.  Recurrent oral ulcers: She continues to have recurrent oral ulcers.  She currently has healing oral and nasal ulcerations.   High risk medication use -BMP and CBC was drawn on 05/29/2018.  We will check LFTs today.  She will return for labs in October and every 3 months to monitor for drug toxicity.  She was reminded to schedule a Plaquenil eye exam form.  A 30-day supply of Plaquenil sent to the pharmacy.  She was given 3 samples of Otrexup 20 mg.  A refill of methotrexate will be sent to the pharmacy when she runs out of samples.  She has been taking 8 tablets by mouth once weekly but is been having increased nausea.  A prescription for Zofran 4 mg every 8 hours as needed was sent to the pharmacy.  Potential side effects  were discussed.  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  She has no parotid swelling.  She will continue taking Plaquenil 200 mg 1 tablet BID.  A 1 month supply Plaquenil sent to the pharmacy.  Raynaud's syndrome without gangrene: She has mild symptoms of Raynolds.  No digital ulcerations or signs of gangrene were noted.  Vitamin D deficiency -She was previously taking vitamin D 50,000 units once weekly ran out of the prescription.  Vitamin D level be checked today.  If the vitamin D is within desirable range we will recommend a maintenance dose of vitamin D.  Plan: VITAMIN D 25 Hydroxy (Vit-D Deficiency, Fractures)  Primary osteoarthritis of both hands: She has complete fist formation bilaterally.  She has no synovitis on exam.  Joint protection and muscle strengthening were discussed.  Primary osteoarthritis of both knees: No warmth or effusion.  Good range of motion.  We will schedule her for Synvisc 1 gel injections of bilateral knee joints.  Fibromyalgia -she has been having increased severity and frequency of fibromyalgia flares.  She had a lumbar spinal fusion in June 2019 which caused a flare.  She is currently having generalized muscle aches and muscle tenderness.  She has tenderness in bilateral medial epicondylar region. She takes Flexeril 10 mg PRN and Gabapentin 600 mg TID.  She does not need any refills at this time.   Other medical conditions are listed as follows:  Rheumatoid factor positive  S/P lumbar laminectomy  Tobacco abuse  Hyperlipidemia, unspecified hyperlipidemia type  Essential hypertension  Mitral valve insufficiency, unspecified etiology  Diabetes mellitus without complication ([place])    Orders: Orders Placed This Encounter  Procedures  . VITAMIN D 25 Hydroxy (Vit-D Deficiency, Fractures)  . AST  . ALT  . COMPLETE METABOLIC PANEL WITH GFR  . CBC with  Differential/Platelet  . Urinalysis, Routine w reflex microscopic  . ANA  . Anti-DNA antibody, double-stranded  . Sedimentation rate  . C3 and C4   Meds ordered this encounter  Medications  . hydroxychloroquine (PLAQUENIL) 200 MG tablet    Sig: Take 1 tablet (200 mg total) by mouth 2 (two) times daily.    Dispense:  60 tablet    Refill:  0  . ondansetron (ZOFRAN) 4 MG tablet    Sig: Take 1 tablet (4 mg total) by mouth every 8 (eight) hours as needed for nausea or vomiting.    Dispense:  30 tablet    Refill:  0    Face-to-face time spent with patient was 30 minutes. Greater than 50% of time was spent in counseling and coordination of care.  Follow-Up Instructions: Return in about 6 months (around 01/18/2019) for Systemic lupus erythematosus, Fibromyalgia, Osteoarthritis.   Ofilia Neas, PA-C  I examined and evaluated the patient with Hazel Sams PA.  Patient had no synovitis on examination.  She will continue combination of methotrexate and Plaquenil.  The plan of care was discussed as noted above.  Bo Merino, MD  Note - This record has been created using Editor, commissioning.  Chart creation errors have been sought, but may not always  have been located. Such creation errors do not reflect on  the standard of medical care.

## 2018-07-06 ENCOUNTER — Ambulatory Visit: Payer: BLUE CROSS/BLUE SHIELD | Admitting: Psychology

## 2018-07-11 ENCOUNTER — Ambulatory Visit (INDEPENDENT_AMBULATORY_CARE_PROVIDER_SITE_OTHER): Payer: BLUE CROSS/BLUE SHIELD | Admitting: Psychology

## 2018-07-11 DIAGNOSIS — F331 Major depressive disorder, recurrent, moderate: Secondary | ICD-10-CM | POA: Diagnosis not present

## 2018-07-11 DIAGNOSIS — F410 Panic disorder [episodic paroxysmal anxiety] without agoraphobia: Secondary | ICD-10-CM | POA: Diagnosis not present

## 2018-07-13 ENCOUNTER — Ambulatory Visit (INDEPENDENT_AMBULATORY_CARE_PROVIDER_SITE_OTHER): Payer: BLUE CROSS/BLUE SHIELD | Admitting: Psychology

## 2018-07-13 DIAGNOSIS — F331 Major depressive disorder, recurrent, moderate: Secondary | ICD-10-CM | POA: Diagnosis not present

## 2018-07-13 DIAGNOSIS — F410 Panic disorder [episodic paroxysmal anxiety] without agoraphobia: Secondary | ICD-10-CM

## 2018-07-18 ENCOUNTER — Ambulatory Visit (INDEPENDENT_AMBULATORY_CARE_PROVIDER_SITE_OTHER): Payer: BLUE CROSS/BLUE SHIELD | Admitting: Rheumatology

## 2018-07-18 ENCOUNTER — Encounter: Payer: Self-pay | Admitting: Physician Assistant

## 2018-07-18 VITALS — BP 161/83 | HR 84 | Resp 14 | Ht 66.0 in | Wt 225.0 lb

## 2018-07-18 DIAGNOSIS — Z79899 Other long term (current) drug therapy: Secondary | ICD-10-CM

## 2018-07-18 DIAGNOSIS — R768 Other specified abnormal immunological findings in serum: Secondary | ICD-10-CM

## 2018-07-18 DIAGNOSIS — E785 Hyperlipidemia, unspecified: Secondary | ICD-10-CM

## 2018-07-18 DIAGNOSIS — M19041 Primary osteoarthritis, right hand: Secondary | ICD-10-CM

## 2018-07-18 DIAGNOSIS — K1379 Other lesions of oral mucosa: Secondary | ICD-10-CM

## 2018-07-18 DIAGNOSIS — I7300 Raynaud's syndrome without gangrene: Secondary | ICD-10-CM

## 2018-07-18 DIAGNOSIS — I10 Essential (primary) hypertension: Secondary | ICD-10-CM

## 2018-07-18 DIAGNOSIS — M170 Bilateral primary osteoarthritis of knee: Secondary | ICD-10-CM

## 2018-07-18 DIAGNOSIS — M19042 Primary osteoarthritis, left hand: Secondary | ICD-10-CM

## 2018-07-18 DIAGNOSIS — M3219 Other organ or system involvement in systemic lupus erythematosus: Secondary | ICD-10-CM | POA: Diagnosis not present

## 2018-07-18 DIAGNOSIS — Z720 Tobacco use: Secondary | ICD-10-CM

## 2018-07-18 DIAGNOSIS — I340 Nonrheumatic mitral (valve) insufficiency: Secondary | ICD-10-CM

## 2018-07-18 DIAGNOSIS — M797 Fibromyalgia: Secondary | ICD-10-CM

## 2018-07-18 DIAGNOSIS — M3509 Sicca syndrome with other organ involvement: Secondary | ICD-10-CM | POA: Diagnosis not present

## 2018-07-18 DIAGNOSIS — Z9889 Other specified postprocedural states: Secondary | ICD-10-CM

## 2018-07-18 DIAGNOSIS — E119 Type 2 diabetes mellitus without complications: Secondary | ICD-10-CM

## 2018-07-18 DIAGNOSIS — E559 Vitamin D deficiency, unspecified: Secondary | ICD-10-CM

## 2018-07-18 MED ORDER — HYDROXYCHLOROQUINE SULFATE 200 MG PO TABS: 200.0000 mg | ORAL_TABLET | Freq: Two times a day (BID) | ORAL | 0 refills | Status: DC

## 2018-07-18 MED ORDER — ONDANSETRON HCL 4 MG PO TABS
4.0000 mg | ORAL_TABLET | Freq: Three times a day (TID) | ORAL | 0 refills | Status: DC | PRN
Start: 2018-07-18 — End: 2018-08-21

## 2018-07-18 NOTE — Progress Notes (Signed)
Medication Samples have been provided to the patient.  Drug name: Otrexup       Strength: 20mg        Qty: 3 pens  LOT: [id number]  Exp.Date: 07/2018  Dosing instructions: Inject 1 pen into the skin, once weekly.   The patient has been instructed regarding the correct time, dose, and frequency of taking this medication, including desired effects and most common side effects.   Starlina Lapre C Salma Walrond 1:19 PM 07/18/2018

## 2018-07-18 NOTE — Patient Instructions (Addendum)
Standing Labs We placed an order today for your standing lab work.    Please come back and get your standing labs in October and every 3 months   Check autoimmune labs and CBC/CMP  We have open lab Monday through Friday from 8:30-11:30 AM and 1:30-4:00 PM  at the office of Dr. Bo Merino.   You may experience shorter wait times on Monday and Friday afternoons. The office is located at [address] [place], [place], [place] [address] No appointment is necessary.   Labs are drawn by [facility].  You may receive a bill from [facility] for your lab work. If you have any questions regarding directions or hours of operation,  please call [phone].

## 2018-07-19 ENCOUNTER — Telehealth: Payer: Self-pay | Admitting: Rheumatology

## 2018-07-19 LAB — VITAMIN D 25 HYDROXY (VIT D DEFICIENCY, FRACTURES): Vit D, 25-Hydroxy: 17 ng/mL — ABNORMAL LOW (ref 30–100)

## 2018-07-19 LAB — AST: AST: 20 U/L (ref 10–35)

## 2018-07-19 LAB — ALT: ALT: 12 U/L (ref 6–29)

## 2018-07-19 NOTE — Progress Notes (Signed)
LFTs are WNL. Vitamin D is low.  Please send in vitamin D 50,000 units by mouth twice a week. We will recheck vitamin D in 3 months.

## 2018-07-19 NOTE — Telephone Encounter (Signed)
Patient called stating she was returning your call.   °

## 2018-07-20 ENCOUNTER — Ambulatory Visit: Payer: BLUE CROSS/BLUE SHIELD | Admitting: Psychology

## 2018-07-20 NOTE — Telephone Encounter (Signed)
Attempted to contact the patient and left message for patient to call the office.  

## 2018-07-25 ENCOUNTER — Ambulatory Visit: Payer: Self-pay | Admitting: Primary Care

## 2018-07-25 ENCOUNTER — Telehealth: Payer: Self-pay | Admitting: *Deleted

## 2018-07-25 ENCOUNTER — Ambulatory Visit (INDEPENDENT_AMBULATORY_CARE_PROVIDER_SITE_OTHER): Payer: BLUE CROSS/BLUE SHIELD | Admitting: Physician Assistant

## 2018-07-25 DIAGNOSIS — M1711 Unilateral primary osteoarthritis, right knee: Secondary | ICD-10-CM | POA: Diagnosis not present

## 2018-07-25 DIAGNOSIS — E559 Vitamin D deficiency, unspecified: Secondary | ICD-10-CM

## 2018-07-25 MED ORDER — HYLAN G-F 20 48 MG/6ML IX SOSY
48.0000 mg | PREFILLED_SYRINGE | INTRA_ARTICULAR | Status: AC | PRN
  Administered 2018-07-25: 48 mg via INTRA_ARTICULAR

## 2018-07-25 MED ORDER — VITAMIN D (ERGOCALCIFEROL) 1.25 MG (50000 UNIT) PO CAPS: 50000.0000 [IU] | ORAL_CAPSULE | ORAL | 0 refills | Status: DC

## 2018-07-25 MED ORDER — LIDOCAINE HCL 1 % IJ SOLN
1.5000 mL | INTRAMUSCULAR | Status: AC | PRN
  Administered 2018-07-25: 1.5 mL

## 2018-07-25 NOTE — Telephone Encounter (Signed)
-----   Message from Ofilia Neas, PA-C sent at 07/19/2018  8:08 AM EDT ----- LFTs are WNL. Vitamin D is low.  Please send in vitamin D 50,000 units by mouth twice a week. We will recheck vitamin D in 3 months.

## 2018-07-25 NOTE — Progress Notes (Signed)
   Procedure Note  Patient: VICKIE MELNIK             Date of Birth: [birth year]           MRN: [id number]             Visit Date: 07/25/2018  Procedures: Visit Diagnoses: Primary osteoarthritis of right knee Synvisc One right knee P/P Large Joint Inj: R knee on 07/25/2018 11:27 AM Indications: pain Details: 22 G 1.5 in needle, medial approach  Arthrogram: No  Medications: 48 mg Hylan 48 MG/6ML; 1.5 mL lidocaine 1 % Aspirate: 0 mL Outcome: tolerated well, no immediate complications Procedure, treatment alternatives, risks and benefits explained, specific risks discussed. Consent was given by the patient. Immediately prior to procedure a time out was called to verify the correct patient, procedure, equipment, support staff and site/side marked as required. Patient was prepped and draped in the usual sterile fashion.     Patient tolerated the procedure well.   Hazel Sams, PA-C

## 2018-07-27 ENCOUNTER — Ambulatory Visit: Payer: BLUE CROSS/BLUE SHIELD | Admitting: Psychology

## 2018-08-01 ENCOUNTER — Other Ambulatory Visit: Payer: Self-pay | Admitting: Physician Assistant

## 2018-08-01 ENCOUNTER — Encounter: Payer: Self-pay | Admitting: Primary Care

## 2018-08-01 ENCOUNTER — Ambulatory Visit (INDEPENDENT_AMBULATORY_CARE_PROVIDER_SITE_OTHER): Payer: BLUE CROSS/BLUE SHIELD | Admitting: Primary Care

## 2018-08-01 VITALS — BP 148/94 | HR 63 | Temp 97.9°F | Ht 66.0 in | Wt 229.5 lb

## 2018-08-01 DIAGNOSIS — G8929 Other chronic pain: Secondary | ICD-10-CM

## 2018-08-01 DIAGNOSIS — I10 Essential (primary) hypertension: Secondary | ICD-10-CM

## 2018-08-01 DIAGNOSIS — M545 Low back pain: Secondary | ICD-10-CM

## 2018-08-01 DIAGNOSIS — E119 Type 2 diabetes mellitus without complications: Secondary | ICD-10-CM

## 2018-08-01 MED ORDER — CYCLOBENZAPRINE HCL 10 MG PO TABS: 10.0000 mg | ORAL_TABLET | Freq: Three times a day (TID) | ORAL | 0 refills | Status: DC | PRN

## 2018-08-01 NOTE — Patient Instructions (Addendum)
Start checking your blood sugars daily at various times of the day: Before breakfast, 2 hours after a meal, bedtime.  Increase consumption of vegetables, fruit, whole grains, lean protein.  Ensure you are consuming 64 ounces of water daily.  Continue walking as discussed. Try to increase the duration of your walks.   Schedule a lab only appointment in 3 weeks to recheck your A1C.  It was a pleasure to see you today!

## 2018-08-01 NOTE — Assessment & Plan Note (Signed)
Requesting refill of Flexeril, using three times daily.  No longer on Norco.  Refill sent to pharmacy.

## 2018-08-01 NOTE — Assessment & Plan Note (Signed)
Little activity throughout the day, poor diet. Will have her start monitoring home BP and report elevated readings. Continue current regimen.   Consider increasing clonidine to TID vs switching to hydralazine.

## 2018-08-01 NOTE — Assessment & Plan Note (Signed)
Recent A1C above goal at 8.2. Will have her start monitoring glucose levels daily and rotate different times of the day for more accurate information. Continue Lantus 8 units daily for now. Continue Metformin. Repeat A1C in 3 weeks, consider increasing insulin once we have better idea of how her glucose readings are running.

## 2018-08-01 NOTE — Progress Notes (Signed)
Subjective:    Patient ID: Lauren Winters, female    DOB: 09/09/1966, 52 y.o.   MRN: [id number]  HPI  Lauren Winters is a 52 year old female who presents today for follow up.  1) Type 2 Diabetes:   Current medications include: Metformin ER 1000 mg, Lantus 8 units   She is checking her blood glucose 2-3 times weekly and is getting readings of: AM Fasting: 120-130's  Last A1C: 8.2 in late May 2019, 7.9 in late April 2019 Last Eye Exam: Overdue, she will schedule.  Last Foot Exam: Completed in April 2019 Pneumonia Vaccination: Completed in 2015 ACE/ARB: Valsartan  Statin: Crestor   Diet currently consists of:  Breakfast: Skips Lunch: Sandwich, chips  Dinner: Hot dog, sandwich, occasional hamburger Snacks: Occasionally pop tart Desserts: 2-3 times weekly, pop tart Beverages: Diet Coke, water  Exercise: She is walking several times weekly   Wt Readings from Last 3 Encounters:  08/01/18 229 lb 8 oz (104.1 kg)  07/18/18 225 lb (102.1 kg)  05/26/18 220 lb (99.8 kg)   BP Readings from Last 3 Encounters:  08/01/18 (!) 148/94  07/18/18 (!) 161/83  05/30/18 (!) 152/68    She is not checking her BP at home.    Review of Systems  Eyes: Negative for visual disturbance.  Respiratory: Negative for shortness of breath.   Cardiovascular: Negative for chest pain.  Neurological: Negative for dizziness.       Past Medical History:  Diagnosis Date  . Anxiety   . Atypical chest pain    a. ETT 05/2014: normal stress test without evidence of myocardial ischemia or chest pain at peak stress  . Complication of anesthesia    migraine headache  . Depression   . Diabetes mellitus without complication ([place]) metformin   dx 2015  . Dyspnea    on exertion  . Fibromyalgia   . Headache   . Heart murmur    told back in 2007, but no mention with 2016 cardiac w/u  . Hypertension   . Lupus (systemic lupus erythematosus) ([place])   . Mitral regurgitation    a. 05/2014 echo: EF >55%, nl  LV/RV sys fxn, mild MR/AR, no valvular stenosis   . Obesity (BMI 30-39.9)   . Raynaud's phenomenon   . Rheumatoid arthritis ([place])   . Sjogren's disease ([place])   . Spondylolisthesis of lumbar region   . Tobacco abuse    a. ongoing; b. ongoing SOB     Social History   Socioeconomic History  . Marital status: Married    Spouse name: Not on file  . Number of children: Not on file  . Years of education: Not on file  . Highest education level: Not on file  Occupational History  . Not on file  Social Needs  . Financial resource strain: Not on file  . Food insecurity:    Worry: Not on file    Inability: Not on file  . Transportation needs:    Medical: Not on file    Non-medical: Not on file  Tobacco Use  . Smoking status: Former Smoker    Packs/day: 0.50    Years: 10.00    Pack years: 5.00    Types: Cigarettes    Last attempt to quit: 07/22/2015    Years since quitting: 3.0  . Smokeless tobacco: Never Used  Substance and Sexual Activity  . Alcohol use: No    Alcohol/week: 0.0 oz  . Drug use: No  . Sexual activity:  Not on file  Lifestyle  . Physical activity:    Days per week: Not on file    Minutes per session: Not on file  . Stress: Not on file  Relationships  . Social connections:    Talks on phone: Not on file    Gets together: Not on file    Attends religious service: Not on file    Active member of club or organization: Not on file    Attends meetings of clubs or organizations: Not on file    Relationship status: Not on file  . Intimate partner violence:    Fear of current or ex partner: Not on file    Emotionally abused: Not on file    Physically abused: Not on file    Forced sexual activity: Not on file  Other Topics Concern  . Not on file  Social History Narrative   Married.   3 children.   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     Past Surgical History:  Procedure Laterality Date  . ABDOMINAL HYSTERECTOMY    .  BACK SURGERY    . CESAREAN SECTION     x3  . LUMBAR LAMINECTOMY    . LUMBAR LAMINECTOMY/DECOMPRESSION MICRODISCECTOMY N/A 03/29/2017   Procedure: Re operative Right Lumbar Three-Four Laminectomy and discectomy with Left Lumbar Three-Four, Bilateral Lumbar Four-Five Laminectomy for decompression;  Surgeon: Kevan Ny Ditty, MD;  Location: [place] [facility];  Service: Neurosurgery;  Laterality: N/A;  . SPINAL CORD STIMULATOR INSERTION  12/09/2017   Procedure: LUMBAR SPINAL CORD STIMULATOR IMPLANT;  Surgeon: Eustace Moore, MD;  Location: [facility];  Service: Neurosurgery;;  . SPINAL CORD STIMULATOR REMOVAL N/A 01/11/2018   Procedure: LUMBAR SPINAL CORD STIMULATOR REMOVAL;  Surgeon: Eustace Moore, MD;  Location: [facility];  Service: Neurosurgery;  Laterality: N/A;  . TUBAL LIGATION      Family History  Problem Relation Age of Onset  . Hypertension Mother   . Valvular heart disease Mother   . CAD Father 27       CABG x 4  . Liver cancer Father 53       passed  . CAD Paternal Aunt        CABG  . Breast cancer Paternal Aunt   . CAD Paternal Aunt        CABG  . Breast cancer Paternal Aunt   . CAD Paternal Aunt        CABG  . Breast cancer Paternal Aunt   . CAD Paternal Aunt        CABG  . Breast cancer Paternal Aunt   . CAD Paternal Aunt        CABG  . Breast cancer Paternal Aunt   . CAD Paternal Uncle        MI s/p CABG  . Stroke Maternal Grandmother   . Stroke Maternal Grandfather   . CAD Maternal Grandfather     Allergies  Allergen Reactions  . Pseudoephedrine Hcl Shortness Of Breath and Other (See Comments)    Room starts spinning, can't breath  . Clindamycin/Lincomycin Hives  . Ancef [Cefazolin] Other (See Comments)    Unknown  . Prednisone Other (See Comments)    Chest pain w/ cortisone shots or prednisone pills Elevated blood pressure  . Adhesive [Tape] Rash and Other (See Comments)    Sensitivity to certain band aids and adhesives - burns skin, makes skin raw  . Other Rash and  Other (See Comments)  Sensitivity to certain band aids and adhesives - burns skin, makes skin raw  . Sudafed [Pseudoephedrine Hcl] Other (See Comments)    Dizziness     Current Outpatient Medications on File Prior to Visit  Medication Sig Dispense Refill  . amLODipine (NORVASC) 10 MG tablet Take 1 tablet once daily for blood pressure. (Patient taking differently: Take 10 mg by mouth daily. ) 90 tablet 3  . buPROPion (WELLBUTRIN XL) 150 MG 24 hr tablet Take 1 tablet by mouth once daily for anxiety and depression. (Patient taking differently: Take 150 mg by mouth daily. ) 90 tablet 3  . clobetasol cream (TEMOVATE) 5.05 % Apply 1 application topically 2 (two) times daily as needed (for lupis rash).    . cloNIDine (CATAPRES) 0.1 MG tablet Take 1 tablet by mouth twice daily for blood pressure. (Patient taking differently: Take 0.1 mg by mouth 2 (two) times daily. ) 180 tablet 3  . escitalopram (LEXAPRO) 20 MG tablet Take 20 mg by mouth daily.    Marland Kitchen gabapentin (NEURONTIN) 300 MG capsule Take 600 mg by mouth 3 (three) times daily as needed (for pain).   0  . Halcinonide 0.1 % OINT Apply 1 application topically 2 (two) times daily as needed (for lupis rash).     . hydroxychloroquine (PLAQUENIL) 200 MG tablet Take 1 tablet (200 mg total) by mouth 2 (two) times daily. 60 tablet 0  . Insulin Glargine (LANTUS) 100 UNIT/ML Solostar Pen Inject 8 Units into the skin every evening. 15 mL 5  . Insulin Pen Needle (PEN NEEDLES) 31G X 6 MM MISC Use with insulin as directed. 100 each 5  . metFORMIN (GLUCOPHAGE-XR) 500 MG 24 hr tablet Take 2 tablets every morning with food for diabetes. (Patient taking differently: Take 1,000 mg by mouth daily with breakfast. ) 180 tablet 3  . methotrexate (RHEUMATREX) 2.5 MG tablet Take 8 tablets (20 mg total) by mouth once a week. Caution:Chemotherapy. Protect from light. (Patient taking differently: Take 20 mg by mouth every Tuesday. Caution:Chemotherapy. Protect from light.) 96  tablet 0  . metoprolol tartrate (LOPRESSOR) 100 MG tablet Take 1 tablet by mouth twice daily for blood pressure. (Patient taking differently: Take 100 mg by mouth 2 (two) times daily. Take 1 tablet by mouth twice daily for blood pressure.) 180 tablet 3  . ondansetron (ZOFRAN) 4 MG tablet Take 1 tablet (4 mg total) by mouth every 8 (eight) hours as needed for nausea or vomiting. 30 tablet 0  . pilocarpine (SALAGEN) 5 MG tablet Take 5 mg by mouth daily as needed (for dry mouth).     . rosuvastatin (CRESTOR) 5 MG tablet Take 1 tablet by mouth at bedtime for cholesterol. (Patient taking differently: Take 5 mg by mouth at bedtime. ) 90 tablet 3  . tacrolimus (PROTOPIC) 0.1 % ointment Apply 1 application topically 2 (two) times daily as needed (for lupis rash).     . Triamcinolone Acetonide (TRIAMCINOLONE 0.1 % CREAM : EUCERIN) CREA Apply 1 application topically 2 (two) times daily as needed for rash or irritation.     . valsartan-hydrochlorothiazide (DIOVAN-HCT) 320-25 MG tablet Take 1 tablet by mouth once daily for blood pressure. (Patient taking differently: Take 1 tablet by mouth daily. ) 90 tablet 3  . Vitamin D, Ergocalciferol, (DRISDOL) 50000 units CAPS capsule Take 1 capsule (50,000 Units total) by mouth 2 (two) times a week. 24 capsule 0   No current facility-administered medications on file prior to visit.     BP Marland Kitchen)  148/94   Pulse 63   Temp 97.9 F (36.6 C) (Oral)   Ht 5\' 6"  (1.676 m)   Wt 229 lb 8 oz (104.1 kg)   SpO2 98%   BMI 37.04 kg/m    Objective:   Physical Exam  Constitutional: She appears well-nourished.  Neck: Neck supple.  Cardiovascular: Normal rate and regular rhythm.  Respiratory: Effort normal and breath sounds normal.  Skin: Skin is warm and dry.           Assessment & Plan:

## 2018-08-02 ENCOUNTER — Other Ambulatory Visit: Payer: Self-pay | Admitting: Physician Assistant

## 2018-08-17 ENCOUNTER — Ambulatory Visit: Payer: BLUE CROSS/BLUE SHIELD | Admitting: Psychology

## 2018-08-19 ENCOUNTER — Other Ambulatory Visit: Payer: Self-pay

## 2018-08-19 ENCOUNTER — Encounter: Payer: Self-pay | Admitting: Emergency Medicine

## 2018-08-19 ENCOUNTER — Emergency Department: Payer: BLUE CROSS/BLUE SHIELD

## 2018-08-19 ENCOUNTER — Observation Stay: Payer: BLUE CROSS/BLUE SHIELD

## 2018-08-19 ENCOUNTER — Inpatient Hospital Stay
Admission: EM | Admit: 2018-08-19 | Discharge: 2018-08-21 | DRG: 066 | Disposition: A | Discharge status: Home Health | Payer: BLUE CROSS/BLUE SHIELD | Attending: Internal Medicine | Admitting: Internal Medicine

## 2018-08-19 DIAGNOSIS — I340 Nonrheumatic mitral (valve) insufficiency: Secondary | ICD-10-CM | POA: Diagnosis present

## 2018-08-19 DIAGNOSIS — R202 Paresthesia of skin: Secondary | ICD-10-CM | POA: Diagnosis present

## 2018-08-19 DIAGNOSIS — Z8673 Personal history of transient ischemic attack (TIA), and cerebral infarction without residual deficits: Secondary | ICD-10-CM | POA: Diagnosis present

## 2018-08-19 DIAGNOSIS — I6381 Other cerebral infarction due to occlusion or stenosis of small artery: Secondary | ICD-10-CM | POA: Diagnosis not present

## 2018-08-19 DIAGNOSIS — Z90710 Acquired absence of both cervix and uterus: Secondary | ICD-10-CM

## 2018-08-19 DIAGNOSIS — M797 Fibromyalgia: Secondary | ICD-10-CM | POA: Diagnosis present

## 2018-08-19 DIAGNOSIS — Z881 Allergy status to other antibiotic agents status: Secondary | ICD-10-CM

## 2018-08-19 DIAGNOSIS — Z79899 Other long term (current) drug therapy: Secondary | ICD-10-CM

## 2018-08-19 DIAGNOSIS — F418 Other specified anxiety disorders: Secondary | ICD-10-CM | POA: Diagnosis present

## 2018-08-19 DIAGNOSIS — E119 Type 2 diabetes mellitus without complications: Secondary | ICD-10-CM | POA: Diagnosis present

## 2018-08-19 DIAGNOSIS — E669 Obesity, unspecified: Secondary | ICD-10-CM | POA: Diagnosis present

## 2018-08-19 DIAGNOSIS — Z981 Arthrodesis status: Secondary | ICD-10-CM

## 2018-08-19 DIAGNOSIS — F419 Anxiety disorder, unspecified: Secondary | ICD-10-CM | POA: Diagnosis present

## 2018-08-19 DIAGNOSIS — I10 Essential (primary) hypertension: Secondary | ICD-10-CM | POA: Diagnosis present

## 2018-08-19 DIAGNOSIS — M069 Rheumatoid arthritis, unspecified: Secondary | ICD-10-CM | POA: Diagnosis present

## 2018-08-19 DIAGNOSIS — I7300 Raynaud's syndrome without gangrene: Secondary | ICD-10-CM | POA: Diagnosis present

## 2018-08-19 DIAGNOSIS — I639 Cerebral infarction, unspecified: Secondary | ICD-10-CM | POA: Diagnosis not present

## 2018-08-19 DIAGNOSIS — Z888 Allergy status to other drugs, medicaments and biological substances status: Secondary | ICD-10-CM

## 2018-08-19 DIAGNOSIS — Z8249 Family history of ischemic heart disease and other diseases of the circulatory system: Secondary | ICD-10-CM

## 2018-08-19 DIAGNOSIS — Z91048 Other nonmedicinal substance allergy status: Secondary | ICD-10-CM

## 2018-08-19 DIAGNOSIS — Z823 Family history of stroke: Secondary | ICD-10-CM

## 2018-08-19 DIAGNOSIS — E785 Hyperlipidemia, unspecified: Secondary | ICD-10-CM | POA: Diagnosis present

## 2018-08-19 DIAGNOSIS — Z87891 Personal history of nicotine dependence: Secondary | ICD-10-CM

## 2018-08-19 DIAGNOSIS — Z794 Long term (current) use of insulin: Secondary | ICD-10-CM

## 2018-08-19 DIAGNOSIS — M329 Systemic lupus erythematosus, unspecified: Secondary | ICD-10-CM | POA: Diagnosis present

## 2018-08-19 DIAGNOSIS — M3500 Sicca syndrome, unspecified: Secondary | ICD-10-CM | POA: Diagnosis present

## 2018-08-19 DIAGNOSIS — Z6835 Body mass index (BMI) 35.0-35.9, adult: Secondary | ICD-10-CM

## 2018-08-19 LAB — DIFFERENTIAL
BASOS ABS: 0.2 10*3/uL — AB (ref 0–0.1)
Basophils Relative: 3 %
EOS ABS: 0.2 10*3/uL (ref 0–0.7)
Eosinophils Relative: 3 %
LYMPHS ABS: 1.6 10*3/uL (ref 1.0–3.6)
LYMPHS PCT: 26 %
Monocytes Absolute: 0.3 10*3/uL (ref 0.2–0.9)
Monocytes Relative: 5 %
NEUTROS PCT: 63 %
Neutro Abs: 4.1 10*3/uL (ref 1.4–6.5)

## 2018-08-19 LAB — CBC
HCT: 38 % (ref 35.0–47.0)
HEMOGLOBIN: 13.1 g/dL (ref 12.0–16.0)
MCH: 27.2 pg (ref 26.0–34.0)
MCHC: 34.4 g/dL (ref 32.0–36.0)
MCV: 79 fL — ABNORMAL LOW (ref 80.0–100.0)
Platelets: 369 10*3/uL (ref 150–440)
RBC: 4.81 MIL/uL (ref 3.80–5.20)
RDW: 14.4 % (ref 11.5–14.5)
WBC: 6.4 10*3/uL (ref 3.6–11.0)

## 2018-08-19 LAB — COMPREHENSIVE METABOLIC PANEL
ALBUMIN: 4.3 g/dL (ref 3.5–5.0)
ALK PHOS: 124 U/L (ref 38–126)
ALT: 15 U/L (ref 0–44)
AST: 22 U/L (ref 15–41)
Anion gap: 9 (ref 5–15)
BUN: 25 mg/dL — AB (ref 6–20)
CO2: 29 mmol/L (ref 22–32)
Calcium: 9.6 mg/dL (ref 8.9–10.3)
Chloride: 98 mmol/L (ref 98–111)
Creatinine, Ser: 1.02 mg/dL — ABNORMAL HIGH (ref 0.44–1.00)
GFR calc Af Amer: >60 mL/min (ref >60)
GFR calc non Af Amer: >60 mL/min (ref >60)
GLUCOSE: 273 mg/dL — AB (ref 70–99)
POTASSIUM: 3.6 mmol/L (ref 3.5–5.1)
Sodium: 136 mmol/L (ref 135–145)
Total Bilirubin: 0.7 mg/dL (ref 0.3–1.2)
Total Protein: 8.7 g/dL — ABNORMAL HIGH (ref 6.5–8.1)

## 2018-08-19 LAB — TROPONIN I: Troponin I: <0.03 ng/mL (ref <0.03)

## 2018-08-19 LAB — PROTIME-INR
INR: 0.89
Prothrombin Time: 12 seconds (ref 11.4–15.2)

## 2018-08-19 LAB — GLUCOSE, CAPILLARY
GLUCOSE-CAPILLARY: 165 mg/dL — AB (ref 70–99)
Glucose-Capillary: 242 mg/dL — ABNORMAL HIGH (ref 70–99)

## 2018-08-19 LAB — APTT: APTT: 31 s (ref 24–36)

## 2018-08-19 MED ORDER — ASPIRIN EC 325 MG PO TBEC
325.0000 mg | DELAYED_RELEASE_TABLET | Freq: Every day | ORAL | Status: DC
  Administered 2018-08-19 – 2018-08-21 (×3): 325 mg via ORAL
  Filled 2018-08-19 (×3): qty 1

## 2018-08-19 MED ORDER — INSULIN ASPART 100 UNIT/ML ~~LOC~~ SOLN
0.0000 [IU] | Freq: Three times a day (TID) | SUBCUTANEOUS | Status: DC
  Administered 2018-08-19: 2 [IU] via SUBCUTANEOUS
  Administered 2018-08-20: 1 [IU] via SUBCUTANEOUS
  Administered 2018-08-20 – 2018-08-21 (×4): 3 [IU] via SUBCUTANEOUS
  Filled 2018-08-19 (×5): qty 1

## 2018-08-19 MED ORDER — ROSUVASTATIN CALCIUM 10 MG PO TABS: 5.0000 mg | ORAL_TABLET | Freq: Every day | ORAL | Status: DC

## 2018-08-19 MED ORDER — AMLODIPINE BESYLATE 10 MG PO TABS: 10.0000 mg | ORAL_TABLET | Freq: Every day | ORAL | Status: DC

## 2018-08-19 MED ORDER — ACETAMINOPHEN 650 MG RE SUPP: 650.0000 mg | RECTAL | Status: DC | PRN

## 2018-08-19 MED ORDER — CLOBETASOL PROPIONATE 0.05 % EX CREA
1.0000 "application " | TOPICAL_CREAM | Freq: Two times a day (BID) | CUTANEOUS | Status: DC | PRN
  Filled 2018-08-19: qty 15

## 2018-08-19 MED ORDER — STROKE: EARLY STAGES OF RECOVERY BOOK
Freq: Once | Status: AC
  Administered 2018-08-19: 15:00:00

## 2018-08-19 MED ORDER — GADOBENATE DIMEGLUMINE 529 MG/ML IV SOLN
20.0000 mL | Freq: Once | INTRAVENOUS | Status: AC | PRN
  Administered 2018-08-19: 20 mL via INTRAVENOUS

## 2018-08-19 MED ORDER — ASPIRIN 81 MG PO CHEW
324.0000 mg | CHEWABLE_TABLET | Freq: Once | ORAL | Status: AC
  Administered 2018-08-19: 324 mg via ORAL
  Filled 2018-08-19: qty 4

## 2018-08-19 MED ORDER — ACETAMINOPHEN 160 MG/5ML PO SOLN
650.0000 mg | ORAL | Status: DC | PRN
  Filled 2018-08-19: qty 20.3

## 2018-08-19 MED ORDER — TRIAMCINOLONE 0.1 % CREAM:EUCERIN CREAM 1:1
1.0000 "application " | TOPICAL_CREAM | Freq: Two times a day (BID) | CUTANEOUS | Status: DC | PRN
  Filled 2018-08-19: qty 1

## 2018-08-19 MED ORDER — ONDANSETRON HCL 4 MG PO TABS
4.0000 mg | ORAL_TABLET | Freq: Three times a day (TID) | ORAL | Status: DC | PRN
  Administered 2018-08-20 (×2): 4 mg via ORAL
  Filled 2018-08-19 (×3): qty 1

## 2018-08-19 MED ORDER — ENOXAPARIN SODIUM 40 MG/0.4ML ~~LOC~~ SOLN
40.0000 mg | SUBCUTANEOUS | Status: DC
  Administered 2018-08-19 – 2018-08-20 (×2): 40 mg via SUBCUTANEOUS
  Filled 2018-08-19 (×2): qty 0.4

## 2018-08-19 MED ORDER — ASPIRIN 300 MG RE SUPP: 300.0000 mg | Freq: Every day | RECTAL | Status: DC

## 2018-08-19 MED ORDER — VITAMIN D (ERGOCALCIFEROL) 1.25 MG (50000 UNIT) PO CAPS: 50000.0000 [IU] | ORAL_CAPSULE | ORAL | Status: DC

## 2018-08-19 MED ORDER — VALSARTAN-HYDROCHLOROTHIAZIDE 320-25 MG PO TABS: 1.0000 | ORAL_TABLET | Freq: Every day | ORAL | Status: DC

## 2018-08-19 MED ORDER — CYCLOBENZAPRINE HCL 10 MG PO TABS
10.0000 mg | ORAL_TABLET | Freq: Three times a day (TID) | ORAL | Status: DC | PRN
  Administered 2018-08-20: 10 mg via ORAL
  Filled 2018-08-19: qty 1

## 2018-08-19 MED ORDER — HYDROXYCHLOROQUINE SULFATE 200 MG PO TABS
200.0000 mg | ORAL_TABLET | Freq: Two times a day (BID) | ORAL | Status: DC
  Administered 2018-08-20 – 2018-08-21 (×4): 200 mg via ORAL
  Filled 2018-08-19 (×8): qty 1

## 2018-08-19 MED ORDER — ESCITALOPRAM OXALATE 10 MG PO TABS
20.0000 mg | ORAL_TABLET | Freq: Every day | ORAL | Status: DC
  Administered 2018-08-19 – 2018-08-21 (×3): 20 mg via ORAL
  Filled 2018-08-19 (×5): qty 2

## 2018-08-19 MED ORDER — INSULIN GLARGINE 100 UNIT/ML ~~LOC~~ SOLN
8.0000 [IU] | Freq: Every day | SUBCUTANEOUS | Status: DC
  Administered 2018-08-19 – 2018-08-21 (×4): 8 [IU] via SUBCUTANEOUS
  Filled 2018-08-19 (×5): qty 0.08

## 2018-08-19 MED ORDER — CLONIDINE HCL 0.1 MG PO TABS
0.1000 mg | ORAL_TABLET | Freq: Two times a day (BID) | ORAL | Status: DC
  Administered 2018-08-20: 0.1 mg via ORAL
  Filled 2018-08-19 (×3): qty 1

## 2018-08-19 MED ORDER — PILOCARPINE HCL 5 MG PO TABS
5.0000 mg | ORAL_TABLET | Freq: Every day | ORAL | Status: DC | PRN
  Filled 2018-08-19: qty 1

## 2018-08-19 MED ORDER — METHOTREXATE 2.5 MG PO TABS: 20.0000 mg | ORAL_TABLET | ORAL | Status: DC

## 2018-08-19 MED ORDER — METOPROLOL TARTRATE 50 MG PO TABS
100.0000 mg | ORAL_TABLET | Freq: Two times a day (BID) | ORAL | Status: DC
  Administered 2018-08-20: 100 mg via ORAL
  Filled 2018-08-19 (×3): qty 2

## 2018-08-19 MED ORDER — ACETAMINOPHEN 325 MG PO TABS
650.0000 mg | ORAL_TABLET | ORAL | Status: DC | PRN
  Administered 2018-08-19 – 2018-08-20 (×3): 650 mg via ORAL
  Filled 2018-08-19 (×4): qty 2

## 2018-08-19 MED ORDER — IRBESARTAN 150 MG PO TABS
300.0000 mg | ORAL_TABLET | Freq: Every day | ORAL | Status: DC
  Filled 2018-08-19: qty 2

## 2018-08-19 MED ORDER — HYDROCHLOROTHIAZIDE 25 MG PO TABS
25.0000 mg | ORAL_TABLET | Freq: Every day | ORAL | Status: DC
  Filled 2018-08-19: qty 1

## 2018-08-19 MED ORDER — GABAPENTIN 300 MG PO CAPS
600.0000 mg | ORAL_CAPSULE | Freq: Three times a day (TID) | ORAL | Status: DC | PRN
  Administered 2018-08-20: 600 mg via ORAL
  Filled 2018-08-19 (×2): qty 2

## 2018-08-19 MED ORDER — ATORVASTATIN CALCIUM 20 MG PO TABS
40.0000 mg | ORAL_TABLET | Freq: Every day | ORAL | Status: DC
  Administered 2018-08-19 – 2018-08-20 (×2): 40 mg via ORAL
  Filled 2018-08-19 (×2): qty 2

## 2018-08-19 MED ORDER — METFORMIN HCL ER 500 MG PO TB24
1000.0000 mg | ORAL_TABLET | Freq: Every day | ORAL | Status: DC
  Administered 2018-08-20 – 2018-08-21 (×2): 1000 mg via ORAL
  Filled 2018-08-19 (×2): qty 2

## 2018-08-19 MED ORDER — TACROLIMUS 0.1 % EX OINT
1.0000 "application " | TOPICAL_OINTMENT | Freq: Two times a day (BID) | CUTANEOUS | Status: DC | PRN
  Filled 2018-08-19: qty 1

## 2018-08-19 MED ORDER — BUPROPION HCL ER (XL) 150 MG PO TB24
150.0000 mg | ORAL_TABLET | Freq: Every day | ORAL | Status: DC
  Administered 2018-08-19 – 2018-08-21 (×3): 150 mg via ORAL
  Filled 2018-08-19 (×4): qty 1

## 2018-08-19 NOTE — ED Triage Notes (Signed)
Pt ambulatory to triage with no difficulty. Pt reports on Monday she noticed numbness to the left side of her lip that went away. Pt then noticed numbness and her left hand went numb on Wednesday and only lasted a few minutes and went away. Last night around 7 pm her left side of her mouth and her left and and left foot went numb and has not gone away. Pt talking in full and complete sentences with no difficulty at this time.

## 2018-08-19 NOTE — ED Notes (Signed)
Patient in MRI 

## 2018-08-19 NOTE — ED Notes (Signed)
Pt taken to MRi by MRI tech.

## 2018-08-19 NOTE — ED Notes (Signed)
Pt states burning/tingling/numbness on L side of face. Sensation less to L side of body. No other neuro deficits noted. Clear speech, moving all extremities on own. No facial droop noted at this time.

## 2018-08-19 NOTE — H&P (Signed)
[facility] NAME: Lauren Winters    MR#:  [id number]  DATE OF BIRTH:  [birth year]  DATE OF ADMISSION:  08/19/2018  PRIMARY CARE PHYSICIAN: Pleas Koch, NP   REQUESTING/REFERRING PHYSICIAN: Eddie Candle MD  CHIEF COMPLAINT:   Chief Complaint  Patient presents with  . Numbness    HISTORY OF PRESENT ILLNESS: Lauren Winters  is a 52 y.o. female with a known history of multiple medical problems as described below who is presenting with complaint of numbness in her lip which started Monday then went away.  And then on Wednesday had numbness in the left hand.  And then starting 7 PM last night.  She started having numbness in her lower half of the left face as well as left upper extremity and left lower extremity.  Patient came to the ER and had an MRI which showed a small acute stroke to subacute stroke.  Patient states that she has chronic back issues and has had some weakness with ambulation.  Denies any double vision no trouble with swallowing.    PAST MEDICAL HISTORY:   Past Medical History:  Diagnosis Date  . Anxiety   . Atypical chest pain    a. ETT 05/2014: normal stress test without evidence of myocardial ischemia or chest pain at peak stress  . Complication of anesthesia    migraine headache  . Depression   . Diabetes mellitus without complication ([place]) metformin   dx 2015  . Dyspnea    on exertion  . Fibromyalgia   . Headache   . Heart murmur    told back in 2007, but no mention with 2016 cardiac w/u  . Hypertension   . Lupus (systemic lupus erythematosus) ([place])   . Mitral regurgitation    a. 05/2014 echo: EF >55%, nl LV/RV sys fxn, mild MR/AR, no valvular stenosis   . Obesity (BMI 30-39.9)   . Raynaud's phenomenon   . Rheumatoid arthritis ([place])   . Sjogren's disease ([facility])   . Spondylolisthesis of lumbar region   . Tobacco abuse    a. ongoing; b. ongoing SOB    PAST SURGICAL HISTORY:  Past Surgical History:   Procedure Laterality Date  . ABDOMINAL HYSTERECTOMY    . BACK SURGERY    . CESAREAN SECTION     x3  . LUMBAR LAMINECTOMY    . LUMBAR LAMINECTOMY/DECOMPRESSION MICRODISCECTOMY N/A 03/29/2017   Procedure: Re operative Right Lumbar Three-Four Laminectomy and discectomy with Left Lumbar Three-Four, Bilateral Lumbar Four-Five Laminectomy for decompression;  Surgeon: Kevan Ny Ditty, MD;  Location: [facility];  Service: Neurosurgery;  Laterality: N/A;  . SPINAL CORD STIMULATOR INSERTION  12/09/2017   Procedure: LUMBAR SPINAL CORD STIMULATOR IMPLANT;  Surgeon: Eustace Moore, MD;  Location: [facility];  Service: Neurosurgery;;  . SPINAL CORD STIMULATOR REMOVAL N/A 01/11/2018   Procedure: LUMBAR SPINAL CORD STIMULATOR REMOVAL;  Surgeon: Eustace Moore, MD;  Location: [facility];  Service: Neurosurgery;  Laterality: N/A;  . TUBAL LIGATION      SOCIAL HISTORY:  Social History   Tobacco Use  . Smoking status: Former Smoker    Packs/day: 0.50    Years: 10.00    Pack years: 5.00    Types: Cigarettes    Last attempt to quit: 07/22/2015    Years since quitting: 3.0  . Smokeless tobacco: Never Used  Substance Use Topics  . Alcohol use: No    Alcohol/week: 0.0 standard drinks  FAMILY HISTORY:  Family History  Problem Relation Age of Onset  . Hypertension Mother   . Valvular heart disease Mother   . CAD Father 75       CABG x 4  . Liver cancer Father 7       passed  . CAD Paternal Aunt        CABG  . Breast cancer Paternal Aunt   . CAD Paternal Aunt        CABG  . Breast cancer Paternal Aunt   . CAD Paternal Aunt        CABG  . Breast cancer Paternal Aunt   . CAD Paternal Aunt        CABG  . Breast cancer Paternal Aunt   . CAD Paternal Aunt        CABG  . Breast cancer Paternal Aunt   . CAD Paternal Uncle        MI s/p CABG  . Stroke Maternal Grandmother   . Stroke Maternal Grandfather   . CAD Maternal Grandfather     DRUG ALLERGIES:  Allergies  Allergen Reactions  .  Pseudoephedrine Hcl Shortness Of Breath and Other (See Comments)    Room starts spinning, can't breath  . Clindamycin/Lincomycin Hives  . Ancef [Cefazolin] Other (See Comments)    Unknown  . Prednisone Other (See Comments)    Chest pain w/ cortisone shots or prednisone pills Elevated blood pressure  . Adhesive [Tape] Rash and Other (See Comments)    Sensitivity to certain band aids and adhesives - burns skin, makes skin raw  . Other Rash and Other (See Comments)    Sensitivity to certain band aids and adhesives - burns skin, makes skin raw  . Sudafed [Pseudoephedrine Hcl] Other (See Comments)    Dizziness     REVIEW OF SYSTEMS:   CONSTITUTIONAL: No fever, fatigue or weakness.  EYES: No blurred or double vision.  EARS, NOSE, AND THROAT: No tinnitus or ear pain.  RESPIRATORY: No cough, shortness of breath, wheezing or hemoptysis.  CARDIOVASCULAR: No chest pain, orthopnea, edema.  GASTROINTESTINAL: No nausea, vomiting, diarrhea or abdominal pain.  GENITOURINARY: No dysuria, hematuria.  ENDOCRINE: No polyuria, nocturia,  HEMATOLOGY: No anemia, easy bruising or bleeding SKIN: No rash or lesion. MUSCULOSKELETAL: No joint pain or arthritis.   NEUROLOGIC: Positive numbness and tingling PSYCHIATRY: No anxiety or depression.   MEDICATIONS AT HOME:  Prior to Admission medications   Medication Sig Start Date End Date Taking? Authorizing Provider  amLODipine (NORVASC) 10 MG tablet Take 1 tablet once daily for blood pressure. Patient taking differently: Take 10 mg by mouth daily.  04/25/18  Yes Pleas Koch, NP  buPROPion (WELLBUTRIN XL) 150 MG 24 hr tablet Take 1 tablet by mouth once daily for anxiety and depression. Patient taking differently: Take 150 mg by mouth daily.  04/25/18  Yes Pleas Koch, NP  clobetasol cream (TEMOVATE) 1.61 % Apply 1 application topically 2 (two) times daily as needed (for lupis rash).   Yes [provider]  cloNIDine (CATAPRES) 0.1 MG  tablet Take 1 tablet by mouth twice daily for blood pressure. Patient taking differently: Take 0.1 mg by mouth 2 (two) times daily.  04/25/18  Yes Pleas Koch, NP  cyclobenzaprine (FLEXERIL) 10 MG tablet Take 1 tablet (10 mg total) by mouth 3 (three) times daily as needed for muscle spasms. 08/01/18  Yes Pleas Koch, NP  escitalopram (LEXAPRO) 20 MG tablet Take 20 mg by mouth  daily.   Yes [provider]  gabapentin (NEURONTIN) 300 MG capsule Take 600 mg by mouth 3 (three) times daily as needed (for pain).  11/02/16  Yes [provider]  Halcinonide 0.1 % OINT Apply 1 application topically 2 (two) times daily as needed (for lupis rash).    Yes [provider]  hydroxychloroquine (PLAQUENIL) 200 MG tablet Take 1 tablet (200 mg total) by mouth 2 (two) times daily. 07/18/18  Yes Ofilia Neas, PA-C  Insulin Glargine (LANTUS) 100 UNIT/ML Solostar Pen Inject 8 Units into the skin every evening. Patient taking differently: Inject 8 Units into the skin daily.  04/25/18  Yes Pleas Koch, NP  Insulin Pen Needle (PEN NEEDLES) 31G X 6 MM MISC Use with insulin as directed. 04/25/18  Yes Pleas Koch, NP  metFORMIN (GLUCOPHAGE-XR) 500 MG 24 hr tablet Take 2 tablets every morning with food for diabetes. Patient taking differently: Take 1,000 mg by mouth daily with breakfast.  04/25/18  Yes Pleas Koch, NP  methotrexate (RHEUMATREX) 2.5 MG tablet Take 8 tablets (20 mg total) by mouth once a week. Caution:Chemotherapy. Protect from light. Patient taking differently: Take 20 mg by mouth every Tuesday. Caution:Chemotherapy. Protect from light. 02/17/18  Yes Deveshwar, Abel Presto, MD  metoprolol tartrate (LOPRESSOR) 100 MG tablet Take 1 tablet by mouth twice daily for blood pressure. Patient taking differently: Take 100 mg by mouth 2 (two) times daily. Take 1 tablet by mouth twice daily for blood pressure. 04/25/18  Yes Pleas Koch, NP  pilocarpine (SALAGEN) 5 MG  tablet Take 5 mg by mouth daily as needed (for dry mouth).    Yes [provider]  rosuvastatin (CRESTOR) 5 MG tablet Take 1 tablet by mouth at bedtime for cholesterol. Patient taking differently: Take 5 mg by mouth at bedtime.  04/25/18  Yes Pleas Koch, NP  tacrolimus (PROTOPIC) 0.1 % ointment Apply 1 application topically 2 (two) times daily as needed (for lupis rash).    Yes [provider]  Triamcinolone Acetonide (TRIAMCINOLONE 0.1 % CREAM : EUCERIN) CREA Apply 1 application topically 2 (two) times daily as needed for rash or irritation.    Yes [provider]  valsartan-hydrochlorothiazide (DIOVAN-HCT) 320-25 MG tablet Take 1 tablet by mouth once daily for blood pressure. Patient taking differently: Take 1 tablet by mouth at bedtime.  04/25/18  Yes Pleas Koch, NP  Vitamin D, Ergocalciferol, (DRISDOL) 50000 units CAPS capsule Take 1 capsule (50,000 Units total) by mouth 2 (two) times a week. Patient taking differently: Take 50,000 Units by mouth every Friday.  07/27/18  Yes Deveshwar, Abel Presto, MD  ondansetron (ZOFRAN) 4 MG tablet Take 1 tablet (4 mg total) by mouth every 8 (eight) hours as needed for nausea or vomiting. Patient not taking: Reported on 08/19/2018 07/18/18   Ofilia Neas, PA-C      PHYSICAL EXAMINATION:   VITAL SIGNS: Blood pressure 134/72, pulse 79, temperature 97.7 F (36.5 C), resp. rate 16, height 5\' 6"  (1.676 m), weight 99.8 kg, SpO2 96 %.  GENERAL:  52 y.o.-year-old patient lying in the bed with no acute distress.  EYES: Pupils equal, round, reactive to light and accommodation. No scleral icterus. Extraocular muscles intact.  HEENT: Head atraumatic, normocephalic. Oropharynx and nasopharynx clear.  NECK:  Supple, no jugular venous distention. No thyroid enlargement, no tenderness.  LUNGS: Normal breath sounds bilaterally, no wheezing, rales,rhonchi or crepitation. No use of accessory muscles of respiration.  CARDIOVASCULAR: S1,  S2 normal. No  murmurs, rubs, or gallops.  ABDOMEN: Soft, nontender, nondistended. Bowel sounds present. No organomegaly or mass.  EXTREMITIES: No pedal edema, cyanosis, or clubbing.  NEUROLOGIC: Cranial nerves II through XII are intact. Muscle strength 5/5 in all extremities. Sensation intact. Gait not checked.  Sensation left upper extremity left lower extremity PSYCHIATRIC: The patient is alert and oriented x 3.  SKIN: No obvious rash, lesion, or ulcer.   LABORATORY PANEL:   CBC Recent Labs  Lab 08/19/18 0522  WBC 6.4  HGB 13.1  HCT 38.0  PLT 369  MCV 79.0*  MCH 27.2  MCHC 34.4  RDW 14.4  LYMPHSABS 1.6  MONOABS 0.3  EOSABS 0.2  BASOSABS 0.2*   ------------------------------------------------------------------------------------------------------------------  Chemistries  Recent Labs  Lab 08/19/18 0522  NA 136  K 3.6  CL 98  CO2 29  GLUCOSE 273*  BUN 25*  CREATININE 1.02*  CALCIUM 9.6  AST 22  ALT 15  ALKPHOS 124  BILITOT 0.7   ------------------------------------------------------------------------------------------------------------------ estimated creatinine clearance is 75.2 mL/min (A) (by C-G formula based on SCr of 1.02 mg/dL (H)). ------------------------------------------------------------------------------------------------------------------ No results for input(s): TSH, T4TOTAL, T3FREE, THYROIDAB in the last 72 hours.  Invalid input(s): FREET3   Coagulation profile Recent Labs  Lab 08/19/18 0522  INR 0.89   ------------------------------------------------------------------------------------------------------------------- No results for input(s): DDIMER in the last 72 hours. -------------------------------------------------------------------------------------------------------------------  Cardiac Enzymes Recent Labs  Lab 08/19/18 0522  TROPONINI <0.03    ------------------------------------------------------------------------------------------------------------------ Invalid input(s): POCBNP  ---------------------------------------------------------------------------------------------------------------  Urinalysis    Component Value Date/Time   COLORURINE YELLOW 02/14/2018 [place] 02/14/2018 1353   LABSPEC 1.027 02/14/2018 1353   PHURINE 5.5 02/14/2018 1353   GLUCOSEU 2+ (A) 02/14/2018 1353   HGBUR NEGATIVE 02/14/2018 1353   BILIRUBINUR NEGATIVE 01/15/2018 1652   BILIRUBINUR negative 09/19/2017 1038   KETONESUR NEGATIVE 02/14/2018 1353   PROTEINUR NEGATIVE 02/14/2018 1353   UROBILINOGEN 0.2 09/19/2017 1038   NITRITE NEGATIVE 02/14/2018 1353   LEUKOCYTESUR NEGATIVE 02/14/2018 1353     RADIOLOGY: Ct Head Wo Contrast  Result Date: 08/19/2018 CLINICAL DATA:  LEFT mouth and LEFT foot numbness. History of diabetes, hypertension, autoimmune disease. EXAM: CT HEAD WITHOUT CONTRAST TECHNIQUE: Contiguous axial images were obtained from the base of the skull through the vertex without intravenous contrast. COMPARISON:  None. FINDINGS: BRAIN: No intraparenchymal hemorrhage, mass effect nor midline shift. The ventricles and sulci are normal. No acute large vascular territory infarcts. No abnormal extra-axial fluid collections. Basal cisterns are patent. VASCULAR: Unremarkable. SKULL/SOFT TISSUES: No skull fracture. No significant soft tissue swelling. ORBITS/SINUSES: The included ocular globes and orbital contents are normal.Trace paranasal sinus mucosal thickening. Mastoid air cells are well aerated. OTHER: None. IMPRESSION: Normal noncontrast CT HEAD. Electronically Signed   By: Elon Alas M.D.   On: 08/19/2018 06:39   Mr Jeri Cos And Wo Contrast  Result Date: 08/19/2018 CLINICAL DATA:  Lupus patient. Acute presentation with tingling sensations beginning 5-6 days ago. EXAM: MRI HEAD WITHOUT AND WITH CONTRAST TECHNIQUE:  Multiplanar, multiecho pulse sequences of the brain and surrounding structures were obtained without and with intravenous contrast. CONTRAST:  42mL MULTIHANCE GADOBENATE DIMEGLUMINE 529 MG/ML IV SOLN COMPARISON:  Head CT same day. FINDINGS: Brain: Diffusion imaging shows a punctate acute infarction in the right lateral thalamus. No other acute finding. Elsewhere, the brain shows a few scattered punctate foci of T2 and FLAIR signal within the hemispheric white matter consistent with mild small vessel ischemic change. No cortical or large vessel territory infarction. No mass lesion, hemorrhage,  hydrocephalus or extra-axial collection. Vascular: Major vessels at the base of the brain show flow. Skull and upper cervical spine: Negative Sinuses/Orbits: Clear/normal Other: None IMPRESSION: Punctate acute infarction in the lateral thalamus on the right. No hemorrhage or mass effect. Mild chronic small-vessel ischemic change of the cerebral hemispheric white matter. Electronically Signed   By: Nelson Chimes M.D.   On: 08/19/2018 12:00    EKG: Orders placed or performed during the hospital encounter of 08/19/18  . ED EKG  . EKG 12-Lead  . EKG 12-Lead  . ED EKG    IMPRESSION AND PLAN: Patient is 52 year old with tingling sensation  1.  Acute CVA Start aspirin Obtain carotid Doppler Echo of the heart Neurology evaluation Lipid panel in the morning PT evaluation  2.  Hypertension continue clonidine I will hold amlodipine  3.  Depression anxiety continue her Wellbutrin Lexapro  4.  Rheumatological disorders continue her Plaquenil  5.  Diabetes type 2 Place her on sliding scale insulin continue Lantus check a hemoglobin A1c  6.  Miscellaneous Lovenox for DVT prophylaxis   All the records are reviewed and case discussed with ED provider. Management plans discussed with the patient, family and they are in agreement.  CODE STATUS: Code Status History    Date Active Date Inactive Code Status Order  ID Comments User Context   05/26/2018 1805 05/30/2018 1621 Full Code [id number]  Eustace Moore, MD Inpatient   01/11/2018 2235 01/16/2018 2117 Full Code [id number]  Eustace Moore, MD Inpatient   12/09/2017 1116 12/13/2017 1551 Full Code [id number]  Eustace Moore, MD Inpatient   03/29/2017 1100 03/31/2017 1824 Full Code [id number]  Traci Sermon, PA-C Inpatient       TOTAL TIME TAKING CARE OF THIS PATIENT: 55 minutes.    Dustin Flock M.D on 08/19/2018 at 12:40 PM  Between 7am to 6pm - Pager - [phone]  After 6pm go to [email or web address] - password [email or web address]  [facility] Office  [phone]  CC: Primary care physician; Pleas Koch, NP

## 2018-08-19 NOTE — ED Notes (Signed)
Pt talking to MRI on the phone.  

## 2018-08-19 NOTE — ED Provider Notes (Signed)
[facility] Emergency Department Provider Note  ____________________________________________   I have reviewed the triage vital signs and the nursing notes. Where available I have reviewed prior notes and, if possible and indicated, outside hospital notes.    HISTORY  Chief Complaint Numbness    HPI Lauren Winters is a 52 y.o. female history of lupus presents today complaining of tingling sensation.  Patient states that she had tingling that started a week ago almost on Monday.  Is been in various different places on the left side of her body since that time.  Sometimes lasting for a few minutes sometimes longer.  Mostly happens when she wakes up.  She had tingling on Monday, on Wednesday, and on Friday, all of these resolved, mostly involving the face and some carbonation of the hand and foot, then last night around 7 when she was at rest she started having tingling in her face, foot and hand.  No focal weakness.  She has baseline weakness she states in her lower legs from prior back surgery.  No change in that.  No incontinence of bowel or bladder no close head injury no headache, patient is anxious about this she states.  However, she denies being more anxious in general.  She denies any recent medication changes.  She does suffer from lupus and is on methotrexate.  She is had no fevers or chills.  No change in vision or hearing, no difficulty walking or talking.  It is now been present for a week off and on and on interruptedly since yesterday night.  Triage CT scan was negative.  Past Medical History:  Diagnosis Date  . Anxiety   . Atypical chest pain    a. ETT 05/2014: normal stress test without evidence of myocardial ischemia or chest pain at peak stress  . Complication of anesthesia    migraine headache  . Depression   . Diabetes mellitus without complication ([place]) metformin   dx 2015  . Dyspnea    on exertion  . Fibromyalgia   . Headache   . Heart  murmur    told back in 2007, but no mention with 2016 cardiac w/u  . Hypertension   . Lupus (systemic lupus erythematosus) ([place])   . Mitral regurgitation    a. 05/2014 echo: EF >55%, nl LV/RV sys fxn, mild MR/AR, no valvular stenosis   . Obesity (BMI 30-39.9)   . Raynaud's phenomenon   . Rheumatoid arthritis ([place])   . Sjogren's disease ([place])   . Spondylolisthesis of lumbar region   . Tobacco abuse    a. ongoing; b. ongoing SOB    Patient Active Problem List   Diagnosis Date Noted  . S/P lumbar spinal fusion 05/26/2018  . High risk medication use 02/03/2018  . Wound infection after surgery 01/11/2018  . S/P lumbar laminectomy 12/09/2017  . Paresthesia 05/11/2017  . HNP (herniated nucleus pulposus), lumbar 03/29/2017  . Chronic right shoulder pain 02/18/2017  . Primary osteoarthritis of both hands 12/22/2016  . Primary osteoarthritis of both knees 12/22/2016  . Spondylosis of lumbar region without myelopathy or radiculopathy 12/22/2016  . Fibromyalgia 12/22/2016  . Preventative health care 04/19/2016  . Chronic back pain 12/17/2015  . Hyperlipidemia 08/29/2015  . Other organ or system involvement in systemic lupus erythematosus ([place]) 08/04/2015  . Rheumatoid factor positive 08/04/2015  . Sjogren's syndrome ([place]) 08/04/2015  . Mitral regurgitation   . Tobacco abuse   . Atypical chest pain   . Obesity (BMI 30-39.9)   .  Anxiety   . Hypertension   . Diabetes mellitus without complication ([place])   . Clinical depression 05/23/2014    Past Surgical History:  Procedure Laterality Date  . ABDOMINAL HYSTERECTOMY    . BACK SURGERY    . CESAREAN SECTION     x3  . LUMBAR LAMINECTOMY    . LUMBAR LAMINECTOMY/DECOMPRESSION MICRODISCECTOMY N/A 03/29/2017   Procedure: Re operative Right Lumbar Three-Four Laminectomy and discectomy with Left Lumbar Three-Four, Bilateral Lumbar Four-Five Laminectomy for decompression;  Surgeon: Kevan Ny Ditty, MD;  Location: [facility];  Service:  Neurosurgery;  Laterality: N/A;  . SPINAL CORD STIMULATOR INSERTION  12/09/2017   Procedure: LUMBAR SPINAL CORD STIMULATOR IMPLANT;  Surgeon: Eustace Moore, MD;  Location: [place];  Service: Neurosurgery;;  . SPINAL CORD STIMULATOR REMOVAL N/A 01/11/2018   Procedure: LUMBAR SPINAL CORD STIMULATOR REMOVAL;  Surgeon: Eustace Moore, MD;  Location: [place];  Service: Neurosurgery;  Laterality: N/A;  . TUBAL LIGATION      Prior to Admission medications   Medication Sig Start Date End Date Taking? Authorizing Provider  amLODipine (NORVASC) 10 MG tablet Take 1 tablet once daily for blood pressure. Patient taking differently: Take 10 mg by mouth daily.  04/25/18   Pleas Koch, NP  buPROPion (WELLBUTRIN XL) 150 MG 24 hr tablet Take 1 tablet by mouth once daily for anxiety and depression. Patient taking differently: Take 150 mg by mouth daily.  04/25/18   Pleas Koch, NP  clobetasol cream (TEMOVATE) 9.38 % Apply 1 application topically 2 (two) times daily as needed (for lupis rash).    [provider]  cloNIDine (CATAPRES) 0.1 MG tablet Take 1 tablet by mouth twice daily for blood pressure. Patient taking differently: Take 0.1 mg by mouth 2 (two) times daily.  04/25/18   Pleas Koch, NP  cyclobenzaprine (FLEXERIL) 10 MG tablet Take 1 tablet (10 mg total) by mouth 3 (three) times daily as needed for muscle spasms. 08/01/18   Pleas Koch, NP  escitalopram (LEXAPRO) 20 MG tablet Take 20 mg by mouth daily.    [provider]  gabapentin (NEURONTIN) 300 MG capsule Take 600 mg by mouth 3 (three) times daily as needed (for pain).  11/02/16   [provider]  Halcinonide 0.1 % OINT Apply 1 application topically 2 (two) times daily as needed (for lupis rash).     [provider]  hydroxychloroquine (PLAQUENIL) 200 MG tablet Take 1 tablet (200 mg total) by mouth 2 (two) times daily. 07/18/18   Ofilia Neas, PA-C  Insulin Glargine (LANTUS) 100 UNIT/ML Solostar  Pen Inject 8 Units into the skin every evening. 04/25/18   Pleas Koch, NP  Insulin Pen Needle (PEN NEEDLES) 31G X 6 MM MISC Use with insulin as directed. 04/25/18   Pleas Koch, NP  metFORMIN (GLUCOPHAGE-XR) 500 MG 24 hr tablet Take 2 tablets every morning with food for diabetes. Patient taking differently: Take 1,000 mg by mouth daily with breakfast.  04/25/18   Pleas Koch, NP  methotrexate (RHEUMATREX) 2.5 MG tablet Take 8 tablets (20 mg total) by mouth once a week. Caution:Chemotherapy. Protect from light. Patient taking differently: Take 20 mg by mouth every Tuesday. Caution:Chemotherapy. Protect from light. 02/17/18   Bo Merino, MD  metoprolol tartrate (LOPRESSOR) 100 MG tablet Take 1 tablet by mouth twice daily for blood pressure. Patient taking differently: Take 100 mg by mouth 2 (two) times daily. Take 1 tablet by mouth twice daily for  blood pressure. 04/25/18   Pleas Koch, NP  ondansetron (ZOFRAN) 4 MG tablet Take 1 tablet (4 mg total) by mouth every 8 (eight) hours as needed for nausea or vomiting. 07/18/18   Ofilia Neas, PA-C  pilocarpine (SALAGEN) 5 MG tablet Take 5 mg by mouth daily as needed (for dry mouth).     [provider]  rosuvastatin (CRESTOR) 5 MG tablet Take 1 tablet by mouth at bedtime for cholesterol. Patient taking differently: Take 5 mg by mouth at bedtime.  04/25/18   Pleas Koch, NP  tacrolimus (PROTOPIC) 0.1 % ointment Apply 1 application topically 2 (two) times daily as needed (for lupis rash).     [provider]  Triamcinolone Acetonide (TRIAMCINOLONE 0.1 % CREAM : EUCERIN) CREA Apply 1 application topically 2 (two) times daily as needed for rash or irritation.     [provider]  valsartan-hydrochlorothiazide (DIOVAN-HCT) 320-25 MG tablet Take 1 tablet by mouth once daily for blood pressure. Patient taking differently: Take 1 tablet by mouth daily.  04/25/18   Pleas Koch, NP  Vitamin D,  Ergocalciferol, (DRISDOL) 50000 units CAPS capsule Take 1 capsule (50,000 Units total) by mouth 2 (two) times a week. 07/27/18   Bo Merino, MD    Allergies Pseudoephedrine hcl; Clindamycin/lincomycin; Ancef [cefazolin]; Prednisone; Adhesive [tape]; Other; and Sudafed [pseudoephedrine hcl]  Family History  Problem Relation Age of Onset  . Hypertension Mother   . Valvular heart disease Mother   . CAD Father 71       CABG x 4  . Liver cancer Father 58       passed  . CAD Paternal Aunt        CABG  . Breast cancer Paternal Aunt   . CAD Paternal Aunt        CABG  . Breast cancer Paternal Aunt   . CAD Paternal Aunt        CABG  . Breast cancer Paternal Aunt   . CAD Paternal Aunt        CABG  . Breast cancer Paternal Aunt   . CAD Paternal Aunt        CABG  . Breast cancer Paternal Aunt   . CAD Paternal Uncle        MI s/p CABG  . Stroke Maternal Grandmother   . Stroke Maternal Grandfather   . CAD Maternal Grandfather     Social History Social History   Tobacco Use  . Smoking status: Former Smoker    Packs/day: 0.50    Years: 10.00    Pack years: 5.00    Types: Cigarettes    Last attempt to quit: 07/22/2015    Years since quitting: 3.0  . Smokeless tobacco: Never Used  Substance Use Topics  . Alcohol use: No    Alcohol/week: 0.0 standard drinks  . Drug use: No    Review of Systems Constitutional: No fever/chills Eyes: No visual changes. ENT: No sore throat. No stiff neck no neck pain Cardiovascular: Denies chest pain. Respiratory: Denies shortness of breath. Gastrointestinal:   no vomiting.  No diarrhea.  No constipation. Genitourinary: Negative for dysuria. Musculoskeletal: Negative lower extremity swelling Skin: Negative for rash. Neurological: Negative for severe headaches, focal weakness    ____________________________________________   PHYSICAL EXAM:  VITAL SIGNS: ED Triage Vitals  Enc Vitals Group     BP 08/19/18 0503 140/87     Pulse  Rate 08/19/18 0503 79     Resp 08/19/18 0503  18     Temp 08/19/18 0503 98.1 F (36.7 C)     Temp Source 08/19/18 0503 Oral     SpO2 08/19/18 0503 98 %     Weight 08/19/18 0505 220 lb (99.8 kg)     Height 08/19/18 0505 5\' 6"  (1.676 m)     Head Circumference --      Peak Flow --      Pain Score 08/19/18 0505 5     Pain Loc --      Pain Edu? --      Excl. in [place]? --     Constitutional: Alert and oriented. Well appearing and in no acute distress. Eyes: Conjunctivae are normal Head: Atraumatic HEENT: No congestion/rhinnorhea. Mucous membranes are moist.  Oropharynx non-erythematous Neck:   Nontender with no meningismus, no masses, no stridor Cardiovascular: Normal rate, regular rhythm. Grossly normal heart sounds.  Good peripheral circulation. Respiratory: Normal respiratory effort.  No retractions. Lungs CTAB. Abdominal: Soft and nontender. No distention. No guarding no rebound Back:  There is no focal tenderness or step off.  there is no midline tenderness there are no lesions noted. there is no CVA tenderness  Musculoskeletal: No lower extremity tenderness, no upper extremity tenderness. No joint effusions, no DVT signs strong distal pulses no edema Neurologic: Cranial nerves II through XII are grossly intact 5 out of 5 strength bilateral upper and lower extremity. Finger to nose within normal limits heel to shin within normal limits, speech is normal with no word finding difficulty or dysarthria, reflexes symmetric, pupils are equally round and reactive to light, there is no pronator drift, sensation is actively diminished on the left upper extremity mostly in the hand but also in the upper arm, left lower extremity mostly in the foot, and left face mostly in the lower face but also to some extent she states in the left upper face, vision is intact to confrontation, gait is deferred, there is no nystagmus,  Skin:  Skin is warm, dry and intact. No rash noted. Psychiatric: Mood and affect  are anxious. Speech and behavior are normal.  ____________________________________________   LABS (all labs ordered are listed, but only abnormal results are displayed)  Labs Reviewed  CBC - Abnormal; Notable for the following components:      Result Value   MCV 79.0 (*)    All other components within normal limits  DIFFERENTIAL - Abnormal; Notable for the following components:   Basophils Absolute 0.2 (*)    All other components within normal limits  COMPREHENSIVE METABOLIC PANEL - Abnormal; Notable for the following components:   Glucose, Bld 273 (*)    BUN 25 (*)    Creatinine, Ser 1.02 (*)    Total Protein 8.7 (*)    All other components within normal limits  PROTIME-INR  APTT  TROPONIN I  CBG MONITORING, ED    Pertinent labs  results that were available during my care of the patient were reviewed by me and considered in my medical decision making (see chart for details). ____________________________________________  EKG  I personally interpreted any EKGs ordered by me or triage Normal sinus rhythm, rate 70 bpm no acute ST elevation or depression, LAD noted, no acute ischemia. ____________________________________________  RADIOLOGY  Pertinent labs & imaging results that were available during my care of the patient were reviewed by me and considered in my medical decision making (see chart for details). If possible, patient and/or family made aware of any abnormal findings.  Ct Head Wo  Contrast  Result Date: 08/19/2018 CLINICAL DATA:  LEFT mouth and LEFT foot numbness. History of diabetes, hypertension, autoimmune disease. EXAM: CT HEAD WITHOUT CONTRAST TECHNIQUE: Contiguous axial images were obtained from the base of the skull through the vertex without intravenous contrast. COMPARISON:  None. FINDINGS: BRAIN: No intraparenchymal hemorrhage, mass effect nor midline shift. The ventricles and sulci are normal. No acute large vascular territory infarcts. No abnormal  extra-axial fluid collections. Basal cisterns are patent. VASCULAR: Unremarkable. SKULL/SOFT TISSUES: No skull fracture. No significant soft tissue swelling. ORBITS/SINUSES: The included ocular globes and orbital contents are normal.Trace paranasal sinus mucosal thickening. Mastoid air cells are well aerated. OTHER: None. IMPRESSION: Normal noncontrast CT HEAD. Electronically Signed   By: Elon Alas M.D.   On: 08/19/2018 06:39   ____________________________________________    PROCEDURES  Procedure(s) performed: None  Procedures  Critical Care performed: None  ____________________________________________   INITIAL IMPRESSION / ASSESSMENT AND PLAN / ED COURSE  Pertinent labs & imaging results that were available during my care of the patient were reviewed by me and considered in my medical decision making (see chart for details).  Here with a tingling sensation off and on for a week in the left side.  Does include the face which makes it less likely be cervical pathology, also left upper externally left lower extremity neurologic exam is normal except for the reported tingling which she states is elicited by light touch.  I discussed with Dr. Irish Elders of neurology.  Do appreciate consult.  He did ask for an MRI with and without contrast.  If that is negative, given symptoms, he recommends outpatient follow-up, if positive, we will admit.    ____________________________________________   FINAL CLINICAL IMPRESSION(S) / ED DIAGNOSES  Final diagnoses:  None      This chart was dictated using voice recognition software.  Despite best efforts to proofread,  errors can occur which can change meaning.      Schuyler Amor, MD 08/19/18 [phone]

## 2018-08-19 NOTE — ED Notes (Signed)
Patient is back from MRI  

## 2018-08-20 ENCOUNTER — Observation Stay: Admit: 2018-08-20 | Payer: BLUE CROSS/BLUE SHIELD

## 2018-08-20 DIAGNOSIS — I639 Cerebral infarction, unspecified: Secondary | ICD-10-CM | POA: Diagnosis not present

## 2018-08-20 LAB — GLUCOSE, CAPILLARY
GLUCOSE-CAPILLARY: 138 mg/dL — AB (ref 70–99)
GLUCOSE-CAPILLARY: 192 mg/dL — AB (ref 70–99)
Glucose-Capillary: 246 mg/dL — ABNORMAL HIGH (ref 70–99)
Glucose-Capillary: 219 mg/dL — ABNORMAL HIGH (ref 70–99)

## 2018-08-20 LAB — LIPID PANEL
CHOL/HDL RATIO: 6.2 ratio
Cholesterol: 162 mg/dL (ref 0–200)
HDL: 26 mg/dL — AB (ref >40)
LDL CALC: 76 mg/dL (ref 0–99)
TRIGLYCERIDES: 302 mg/dL — AB (ref <150)
VLDL: 60 mg/dL — ABNORMAL HIGH (ref 0–40)

## 2018-08-20 LAB — HEMOGLOBIN A1C
HEMOGLOBIN A1C: 8.5 % — AB (ref 4.8–5.6)
Mean Plasma Glucose: 197.25 mg/dL

## 2018-08-20 NOTE — Consult Note (Signed)
Referring Physician:  Dr. Posey Pronto    Chief Complaint: L sided numbness   HPI: Lauren Winters is an 52 y.o. female with hx fibromyalgia, RA, lupus  who is presenting with complaint of numbness in her lip which started Monday then went away.  And then on Wednesday had numbness in the left hand that progressed to involve L leg yesterday.  She was not on anti platelet therapy and was found to have R thalamic infarct   Date last known well: Date: 08/14/2018 Time last known well: Unable to determine tPA Given: No: outside window   Past Medical History:  Diagnosis Date  . Anxiety   . Atypical chest pain    a. ETT 05/2014: normal stress test without evidence of myocardial ischemia or chest pain at peak stress  . Complication of anesthesia    migraine headache  . Depression   . Diabetes mellitus without complication ([place]) metformin   dx 2015  . Dyspnea    on exertion  . Fibromyalgia   . Headache   . Heart murmur    told back in 2007, but no mention with 2016 cardiac w/u  . Hypertension   . Lupus (systemic lupus erythematosus) ([place])   . Mitral regurgitation    a. 05/2014 echo: EF >55%, nl LV/RV sys fxn, mild MR/AR, no valvular stenosis   . Obesity (BMI 30-39.9)   . Raynaud's phenomenon   . Rheumatoid arthritis ([place])   . Sjogren's disease ([place])   . Spondylolisthesis of lumbar region   . Tobacco abuse    a. ongoing; b. ongoing SOB    Past Surgical History:  Procedure Laterality Date  . ABDOMINAL HYSTERECTOMY    . BACK SURGERY    . CESAREAN SECTION     x3  . LUMBAR LAMINECTOMY    . LUMBAR LAMINECTOMY/DECOMPRESSION MICRODISCECTOMY N/A 03/29/2017   Procedure: Re operative Right Lumbar Three-Four Laminectomy and discectomy with Left Lumbar Three-Four, Bilateral Lumbar Four-Five Laminectomy for decompression;  Surgeon: Kevan Ny Ditty, MD;  Location: [facility];  Service: Neurosurgery;  Laterality: N/A;  . SPINAL CORD STIMULATOR INSERTION  12/09/2017   Procedure: LUMBAR SPINAL CORD  STIMULATOR IMPLANT;  Surgeon: Eustace Moore, MD;  Location: [facility];  Service: Neurosurgery;;  . SPINAL CORD STIMULATOR REMOVAL N/A 01/11/2018   Procedure: LUMBAR SPINAL CORD STIMULATOR REMOVAL;  Surgeon: Eustace Moore, MD;  Location: [facility];  Service: Neurosurgery;  Laterality: N/A;  . TUBAL LIGATION      Family History  Problem Relation Age of Onset  . Hypertension Mother   . Valvular heart disease Mother   . CAD Father 97       CABG x 4  . Liver cancer Father 32       passed  . CAD Paternal Aunt        CABG  . Breast cancer Paternal Aunt   . CAD Paternal Aunt        CABG  . Breast cancer Paternal Aunt   . CAD Paternal Aunt        CABG  . Breast cancer Paternal Aunt   . CAD Paternal Aunt        CABG  . Breast cancer Paternal Aunt   . CAD Paternal Aunt        CABG  . Breast cancer Paternal Aunt   . CAD Paternal Uncle        MI s/p CABG  . Stroke Maternal Grandmother   . Stroke Maternal Grandfather   .  CAD Maternal Grandfather    Social History:  reports that she quit smoking about 3 years ago. Her smoking use included cigarettes. She has a 5.00 pack-year smoking history. She has never used smokeless tobacco. She reports that she does not drink alcohol or use drugs.  Allergies:  Allergies  Allergen Reactions  . Pseudoephedrine Hcl Shortness Of Breath and Other (See Comments)    Room starts spinning, can't breath  . Clindamycin/Lincomycin Hives  . Ancef [Cefazolin] Other (See Comments)    Unknown  . Prednisone Other (See Comments)    Chest pain w/ cortisone shots or prednisone pills Elevated blood pressure  . Adhesive [Tape] Rash and Other (See Comments)    Sensitivity to certain band aids and adhesives - burns skin, makes skin raw  . Other Rash and Other (See Comments)    Sensitivity to certain band aids and adhesives - burns skin, makes skin raw  . Sudafed [Pseudoephedrine Hcl] Other (See Comments)    Dizziness     Medications: I have reviewed the patient's  current medications.  ROS: History obtained from the patient  General ROS: negative for - chills, fatigue, fever, night sweats, weight gain or weight loss Psychological ROS: negative for - behavioral disorder, hallucinations, memory difficulties, mood swings or suicidal ideation Ophthalmic ROS: negative for - blurry vision, double vision, eye pain or loss of vision ENT ROS: negative for - epistaxis, nasal discharge, oral lesions, sore throat, tinnitus or vertigo Allergy and Immunology ROS: negative for - hives or itchy/watery eyes Hematological and Lymphatic ROS: negative for - bleeding problems, bruising or swollen lymph nodes Endocrine ROS: negative for - galactorrhea, hair pattern changes, polydipsia/polyuria or temperature intolerance Respiratory ROS: negative for - cough, hemoptysis, shortness of breath or wheezing Cardiovascular ROS: negative for - chest pain, dyspnea on exertion, edema or irregular heartbeat Gastrointestinal ROS: negative for - abdominal pain, diarrhea, hematemesis, nausea/vomiting or stool incontinence Genito-Urinary ROS: negative for - dysuria, hematuria, incontinence or urinary frequency/urgency Musculoskeletal ROS: negative for - joint swelling or muscular weakness Neurological ROS: as noted in HPI Dermatological ROS: negative for rash and skin lesion changes  Physical Examination: Blood pressure (!) 92/54, pulse (!) 54, temperature 97.8 F (36.6 C), temperature source Oral, resp. rate 15, height 5\' 6"  (1.676 m), weight 99.8 kg, SpO2 98 %.    Neurological Examination   Mental Status: Alert, oriented, thought content appropriate.  Speech fluent without evidence of aphasia.  Able to follow 3 step commands without difficulty. Cranial Nerves: II: Discs flat bilaterally; Visual fields grossly normal, pupils equal, round, reactive to light and accommodation III,IV, VI: ptosis not present, extra-ocular motions intact bilaterally V,VII: smile symmetric, facial  light touch sensation normal bilaterally VIII: hearing normal bilaterally IX,X: gag reflex present XI: bilateral shoulder shrug XII: midline tongue extension Motor: Right : Upper extremity   5/5    Left:     Upper extremity   5/5 and 4+/5 distally UE  Lower extremity   5/5     Lower extremity   4+/5 Tone and bulk:normal tone throughout; no atrophy noted Sensory: decreased L side to touch Deep Tendon Reflexes: 1+ and symmetric throughout Plantars: Right: downgoing   Left: downgoing Cerebellar: normal finger-to-nose, normal rapid alternating movements and normal heel-to-shin test Gait: not tested      Laboratory Studies:  Basic Metabolic Panel: Recent Labs  Lab 08/19/18 0522  NA 136  K 3.6  CL 98  CO2 29  GLUCOSE 273*  BUN 25*  CREATININE 1.02*  CALCIUM  9.6    Liver Function Tests: Recent Labs  Lab 08/19/18 0522  AST 22  ALT 15  ALKPHOS 124  BILITOT 0.7  PROT 8.7*  ALBUMIN 4.3   No results for input(s): LIPASE, AMYLASE in the last 168 hours. No results for input(s): AMMONIA in the last 168 hours.  CBC: Recent Labs  Lab 08/19/18 0522  WBC 6.4  NEUTROABS 4.1  HGB 13.1  HCT 38.0  MCV 79.0*  PLT 369    Cardiac Enzymes: Recent Labs  Lab 08/19/18 0522  TROPONINI <0.03    BNP: Invalid input(s): POCBNP  CBG: Recent Labs  Lab 08/19/18 1733 08/19/18 2032 08/20/18 0801  GLUCAP 165* 44* 58*    Microbiology: Results for orders placed or performed during the hospital encounter of 05/17/18  Surgical pcr screen     Status: None   Collection Time: 05/17/18  1:32 PM  Result Value Ref Range Status   MRSA, PCR NEGATIVE NEGATIVE Final   Staphylococcus aureus NEGATIVE NEGATIVE Final    Comment: (NOTE) The Xpert SA Assay (FDA approved for NASAL specimens in patients 53 years of age and older), is one component of a comprehensive surveillance program. It is not intended to diagnose infection nor to guide or monitor treatment. Performed at [facility], [place] [address], [place], [place] [address]     Coagulation Studies: Recent Labs    08/19/18 0522  LABPROT 12.0  INR 0.89    Urinalysis: No results for input(s): COLORURINE, LABSPEC, PHURINE, GLUCOSEU, HGBUR, BILIRUBINUR, KETONESUR, PROTEINUR, UROBILINOGEN, NITRITE, LEUKOCYTESUR in the last 168 hours.  Invalid input(s): APPERANCEUR  Lipid Panel:    Component Value Date/Time   CHOL 162 08/20/2018 0425   TRIG 302 (H) 08/20/2018 0425   HDL 26 (L) 08/20/2018 0425   CHOLHDL 6.2 08/20/2018 0425   VLDL 60 (H) 08/20/2018 0425   LDLCALC 76 08/20/2018 0425    HgbA1C:  Lab Results  Component Value Date   HGBA1C 8.5 (H) 08/20/2018    Urine Drug Screen:  No results found for: LABOPIA, COCAINSCRNUR, LABBENZ, AMPHETMU, THCU, LABBARB  Alcohol Level: No results for input(s): ETH in the last 168 hours.  Other results: EKG: normal EKG, normal sinus rhythm, unchanged from previous tracings.  Imaging: Ct Head Wo Contrast  Result Date: 08/19/2018 CLINICAL DATA:  LEFT mouth and LEFT foot numbness. History of diabetes, hypertension, autoimmune disease. EXAM: CT HEAD WITHOUT CONTRAST TECHNIQUE: Contiguous axial images were obtained from the base of the skull through the vertex without intravenous contrast. COMPARISON:  None. FINDINGS: BRAIN: No intraparenchymal hemorrhage, mass effect nor midline shift. The ventricles and sulci are normal. No acute large vascular territory infarcts. No abnormal extra-axial fluid collections. Basal cisterns are patent. VASCULAR: Unremarkable. SKULL/SOFT TISSUES: No skull fracture. No significant soft tissue swelling. ORBITS/SINUSES: The included ocular globes and orbital contents are normal.Trace paranasal sinus mucosal thickening. Mastoid air cells are well aerated. OTHER: None. IMPRESSION: Normal noncontrast CT HEAD. Electronically Signed   By: Elon Alas M.D.   On: 08/19/2018 06:39   Mr Jeri Cos And Wo Contrast  Result Date: 08/19/2018 CLINICAL  DATA:  Lupus patient. Acute presentation with tingling sensations beginning 5-6 days ago. EXAM: MRI HEAD WITHOUT AND WITH CONTRAST TECHNIQUE: Multiplanar, multiecho pulse sequences of the brain and surrounding structures were obtained without and with intravenous contrast. CONTRAST:  46mL MULTIHANCE GADOBENATE DIMEGLUMINE 529 MG/ML IV SOLN COMPARISON:  Head CT same day. FINDINGS: Brain: Diffusion imaging shows a punctate acute infarction in the right lateral  thalamus. No other acute finding. Elsewhere, the brain shows a few scattered punctate foci of T2 and FLAIR signal within the hemispheric white matter consistent with mild small vessel ischemic change. No cortical or large vessel territory infarction. No mass lesion, hemorrhage, hydrocephalus or extra-axial collection. Vascular: Major vessels at the base of the brain show flow. Skull and upper cervical spine: Negative Sinuses/Orbits: Clear/normal Other: None IMPRESSION: Punctate acute infarction in the lateral thalamus on the right. No hemorrhage or mass effect. Mild chronic small-vessel ischemic change of the cerebral hemispheric white matter. Electronically Signed   By: Nelson Chimes M.D.   On: 08/19/2018 12:00   US Carotid Bilateral (at [facility] And Ap Only)  Result Date: 08/19/2018 CLINICAL DATA:  Stroke EXAM: BILATERAL CAROTID DUPLEX ULTRASOUND TECHNIQUE: Pearline Cables scale imaging, color Doppler and duplex ultrasound were performed of bilateral carotid and vertebral arteries in the neck. COMPARISON:  None. FINDINGS: Criteria: Quantification of carotid stenosis is based on velocity parameters that correlate the residual internal carotid diameter with NASCET-based stenosis levels, using the diameter of the distal internal carotid lumen as the denominator for stenosis measurement. The following velocity measurements were obtained: RIGHT ICA: 79 cm/sec CCA: 66 cm/sec SYSTOLIC ICA/CCA RATIO:  1.2 ECA: 84 cm/sec LEFT ICA: 41 cm/sec CCA: 94 cm/sec SYSTOLIC ICA/CCA RATIO:   0.4 ECA: 65 cm/sec RIGHT CAROTID ARTERY: Minimal calcified plaque in the bulb. Low resistance internal carotid Doppler pattern. RIGHT VERTEBRAL ARTERY:  Antegrade. LEFT CAROTID ARTERY: Minimal plaque in the bulb. Low resistance internal carotid Doppler pattern is preserved. LEFT VERTEBRAL ARTERY:  Antegrade. IMPRESSION: Less than 50% stenosis in the right and left internal carotid arteries. Electronically Signed   By: Marybelle Killings M.D.   On: 08/19/2018 15:23    Assessment: 52 y.o. female 52 y.o. female with hx fibromyalgia, RA, lupus  who is presenting with complaint of numbness in her lip which started Monday then went away.  And then on Wednesday had numbness in the left hand that progressed to involve L leg yesterday.  She was not on anti platelet therapy and was found to have R thalamic infarct    Stroke Risk Factors - hypertension, HLD   - L thalamic stroke in setting of small vessel disease  - ASA and statin - Pt with likely home health PT - likely d/c planning - HbAIC  08/20/2018, 10:58 AM

## 2018-08-20 NOTE — Progress Notes (Addendum)
0215 Pt with some nausea and headache.  Gave pt zofran and tylenol po.  1791 Recheck and pt with continued dull headache, no nausea. Bradycardic at 54 BPM.  On neuro check pt with some nystagmus, difficulty opening left eye.  Pupils equal, round, reactive.  Continues to have numbness to left hand, weakness and some drift to left foot. Paged on call doctor. Dorna Bloom RN Spoke with Dr Marcille Blanco and will continue to monitor. Dorna Bloom RN

## 2018-08-20 NOTE — Progress Notes (Signed)
[facility] [place] NAME: Lauren Winters    MR#:  [id number]  DATE OF BIRTH:  [birth year]  SUBJECTIVE:  CHIEF COMPLAINT:   Chief Complaint  Patient presents with  . Numbness   -Admitted with left hand numbness, feels like her symptoms have progressively worse overnight.  Also complains of heaviness of her left arm and leg today.   REVIEW OF SYSTEMS:  Review of Systems  Constitutional: Negative for chills, fever and malaise/fatigue.  HENT: Negative for congestion, ear discharge, hearing loss and nosebleeds.   Eyes: Negative for blurred vision and double vision.  Respiratory: Negative for cough, shortness of breath and wheezing.   Cardiovascular: Negative for chest pain and palpitations.  Gastrointestinal: Negative for abdominal pain, constipation, diarrhea, nausea and vomiting.  Genitourinary: Negative for dysuria.  Musculoskeletal: Positive for myalgias.  Neurological: Positive for sensory change and headaches. Negative for dizziness, focal weakness, seizures and weakness.  Psychiatric/Behavioral: Negative for depression and substance abuse.    DRUG ALLERGIES:   Allergies  Allergen Reactions  . Pseudoephedrine Hcl Shortness Of Breath and Other (See Comments)    Room starts spinning, can't breath  . Clindamycin/Lincomycin Hives  . Ancef [Cefazolin] Other (See Comments)    Unknown  . Prednisone Other (See Comments)    Chest pain w/ cortisone shots or prednisone pills Elevated blood pressure  . Adhesive [Tape] Rash and Other (See Comments)    Sensitivity to certain band aids and adhesives - burns skin, makes skin raw  . Other Rash and Other (See Comments)    Sensitivity to certain band aids and adhesives - burns skin, makes skin raw  . Sudafed [Pseudoephedrine Hcl] Other (See Comments)    Dizziness     VITALS:  Blood pressure (!) 92/54, pulse (!) 54, temperature 97.8 F (36.6 C), temperature source Oral, resp. rate 16,  height 5\' 6"  (1.676 m), weight 99.8 kg, SpO2 98 %.  PHYSICAL EXAMINATION:  Physical Exam  GENERAL:  52 y.o.-year-old patient lying in the bed with no acute distress.  EYES: Pupils equal, round, reactive to light and accommodation. No scleral icterus. Extraocular muscles intact.  HEENT: Head atraumatic, normocephalic. Oropharynx and nasopharynx clear.  NECK:  Supple, no jugular venous distention. No thyroid enlargement, no tenderness.  LUNGS: Normal breath sounds bilaterally, no wheezing, rales,rhonchi or crepitation. No use of accessory muscles of respiration.  CARDIOVASCULAR: S1, S2 normal. No murmurs, rubs, or gallops.  ABDOMEN: Soft, nontender, nondistended. Bowel sounds present. No organomegaly or mass.  EXTREMITIES: No pedal edema, cyanosis, or clubbing.  NEUROLOGIC: Cranial nerves II through XII are intact. Muscle strength 5/5 in all extremities. Sensation intact but has a feeling of heaviness with some paresthesias of the left hand and leg.. Gait not checked.  PSYCHIATRIC: The patient is alert and oriented x 3.  SKIN: No obvious rash, lesion, or ulcer.    LABORATORY PANEL:   CBC Recent Labs  Lab 08/19/18 0522  WBC 6.4  HGB 13.1  HCT 38.0  PLT 369   ------------------------------------------------------------------------------------------------------------------  Chemistries  Recent Labs  Lab 08/19/18 0522  NA 136  K 3.6  CL 98  CO2 29  GLUCOSE 273*  BUN 25*  CREATININE 1.02*  CALCIUM 9.6  AST 22  ALT 15  ALKPHOS 124  BILITOT 0.7   ------------------------------------------------------------------------------------------------------------------  Cardiac Enzymes Recent Labs  Lab 08/19/18 0522  TROPONINI <0.03   ------------------------------------------------------------------------------------------------------------------  RADIOLOGY:  Ct Head Wo Contrast  Result Date: 08/19/2018 CLINICAL DATA:  LEFT  mouth and LEFT foot numbness. History of diabetes,  hypertension, autoimmune disease. EXAM: CT HEAD WITHOUT CONTRAST TECHNIQUE: Contiguous axial images were obtained from the base of the skull through the vertex without intravenous contrast. COMPARISON:  None. FINDINGS: BRAIN: No intraparenchymal hemorrhage, mass effect nor midline shift. The ventricles and sulci are normal. No acute large vascular territory infarcts. No abnormal extra-axial fluid collections. Basal cisterns are patent. VASCULAR: Unremarkable. SKULL/SOFT TISSUES: No skull fracture. No significant soft tissue swelling. ORBITS/SINUSES: The included ocular globes and orbital contents are normal.Trace paranasal sinus mucosal thickening. Mastoid air cells are well aerated. OTHER: None. IMPRESSION: Normal noncontrast CT HEAD. Electronically Signed   By: Elon Alas M.D.   On: 08/19/2018 06:39   Mr Jeri Cos And Wo Contrast  Result Date: 08/19/2018 CLINICAL DATA:  Lupus patient. Acute presentation with tingling sensations beginning 5-6 days ago. EXAM: MRI HEAD WITHOUT AND WITH CONTRAST TECHNIQUE: Multiplanar, multiecho pulse sequences of the brain and surrounding structures were obtained without and with intravenous contrast. CONTRAST:  40mL MULTIHANCE GADOBENATE DIMEGLUMINE 529 MG/ML IV SOLN COMPARISON:  Head CT same day. FINDINGS: Brain: Diffusion imaging shows a punctate acute infarction in the right lateral thalamus. No other acute finding. Elsewhere, the brain shows a few scattered punctate foci of T2 and FLAIR signal within the hemispheric white matter consistent with mild small vessel ischemic change. No cortical or large vessel territory infarction. No mass lesion, hemorrhage, hydrocephalus or extra-axial collection. Vascular: Major vessels at the base of the brain show flow. Skull and upper cervical spine: Negative Sinuses/Orbits: Clear/normal Other: None IMPRESSION: Punctate acute infarction in the lateral thalamus on the right. No hemorrhage or mass effect. Mild chronic small-vessel  ischemic change of the cerebral hemispheric white matter. Electronically Signed   By: Nelson Chimes M.D.   On: 08/19/2018 12:00   US Carotid Bilateral (at [facility] And [facility] Only)  Result Date: 08/19/2018 CLINICAL DATA:  Stroke EXAM: BILATERAL CAROTID DUPLEX ULTRASOUND TECHNIQUE: Pearline Cables scale imaging, color Doppler and duplex ultrasound were performed of bilateral carotid and vertebral arteries in the neck. COMPARISON:  None. FINDINGS: Criteria: Quantification of carotid stenosis is based on velocity parameters that correlate the residual internal carotid diameter with NASCET-based stenosis levels, using the diameter of the distal internal carotid lumen as the denominator for stenosis measurement. The following velocity measurements were obtained: RIGHT ICA: 79 cm/sec CCA: 66 cm/sec SYSTOLIC ICA/CCA RATIO:  1.2 ECA: 84 cm/sec LEFT ICA: 41 cm/sec CCA: 94 cm/sec SYSTOLIC ICA/CCA RATIO:  0.4 ECA: 65 cm/sec RIGHT CAROTID ARTERY: Minimal calcified plaque in the bulb. Low resistance internal carotid Doppler pattern. RIGHT VERTEBRAL ARTERY:  Antegrade. LEFT CAROTID ARTERY: Minimal plaque in the bulb. Low resistance internal carotid Doppler pattern is preserved. LEFT VERTEBRAL ARTERY:  Antegrade. IMPRESSION: Less than 50% stenosis in the right and left internal carotid arteries. Electronically Signed   By: Marybelle Killings M.D.   On: 08/19/2018 15:23    EKG:   Orders placed or performed during the hospital encounter of 08/19/18  . ED EKG  . EKG 12-Lead  . EKG 12-Lead  . ED EKG    ASSESSMENT AND PLAN:   52 year old female with past medical history significant for systemic lupus erythematosus, diabetes, hypertension, fibromyalgia, Sjogren's syndrome presents to hospital secondary to left hand tingling and numbness.  1.  Acute thalamic infarct-patient comes with left-sided paresthesias and noted to have right lateral thalamic infarct on MRI. -Carotid Dopplers negative for any hemodynamically significant  stenosis -Likely small vessel disease due to multiple  risk factors. -Started on aspirin and statin -Appreciate neurology consult. -Echocardiogram is pending -PT/OT consults.  Due to progression of her symptoms, will monitor her 1 more day and discharge in a.m. if stable.  2.  Hypertension-continue clonidine, irbesartan, hydrochlorothiazide, metoprolol  3.  Diabetes mellitus-on Lantus and sliding scale insulin.  Also on metformin  4.  SLE-stable.  Outpatient follow-up.  On methotrexate and Plaquenil  5.  DVT prophylaxis-on Lovenox  6.  Depression-continue home meds   All the records are reviewed and case discussed with Care Management/Social Workerr. Management plans discussed with the patient, family and they are in agreement.  CODE STATUS: Full code  TOTAL TIME TAKING CARE OF THIS PATIENT: 38 minutes.   POSSIBLE D/C IN 1-2 DAYS, DEPENDING ON CLINICAL CONDITION.   Lauren Winters M.D on 08/20/2018 at 1:35 PM  Between 7am to 6pm - Pager - [phone]  After 6pm go to [email or web address] - Proofreader  [facility]  Office  [phone]  CC: Primary care physician; Pleas Koch, NP

## 2018-08-20 NOTE — Evaluation (Signed)
Physical Therapy Evaluation Patient Details Name: Lauren Winters MRN: [id number] DOB: 1966-10-03 Today's Date: 08/20/2018   History of Present Illness  Pt admitted for complaints of L side numbness intermittently, now consistently. Now diagnosed with R CVA. HIstory includes DM, depression, multiple back Sx, fibromyalgia, RA, and lupus.   Clinical Impression  Pt is a pleasant 52 year old female who was admitted for R CVA. Pt performs bed mobility with supervision, transfers with cga, and ambulation with cga and RW. Pt then became nauseous and limited further session. Pt demonstrates deficits with strength/mobility/sensation/balance. Decreased sensation to L hemibody has appeared to worsen compared to arrival to hospital. While testing eyes, noted nystagmus with Left eye gaze. Pt very motivated to work with PT and reports sufficient assist in home environment. Would benefit from skilled PT to address above deficits and promote optimal return to PLOF. Recommend transition to [facility] upon discharge from acute hospitalization.       Follow Up Recommendations Home health PT;Supervision for mobility/OOB    Equipment Recommendations  None recommended by PT    Recommendations for Other Services       Precautions / Restrictions Precautions Precautions: Fall Restrictions Weight Bearing Restrictions: No      Mobility  Bed Mobility Overal bed mobility: Needs Assistance Bed Mobility: Supine to Sit     Supine to sit: Supervision     General bed mobility comments: safe technique with use of railing for assistance. Once seated at EOB, able to demonstrate upright posture.   Transfers Overall transfer level: Needs assistance Equipment used: Rolling walker (2 wheeled) Transfers: Sit to/from Stand Sit to Stand: Min guard         General transfer comment: cues given for safe hand placement prior to standing. No buckling of L LE noted.  Ambulation/Gait Ambulation/Gait assistance: Min  guard Gait Distance (Feet): 10 Feet Assistive device: Rolling walker (2 wheeled) Gait Pattern/deviations: Step-to pattern     General Gait Details: reports she feels unsteady and became nauseous at end of treatment, limiting further mobility attempts. Refuses to sit in recliner due to previous back surgery. SLow step to gait pattern with heavy reliance on B UEs. Prior to ambulation, performed standing marching and weight shifting activities.  Stairs            Wheelchair Mobility    Modified Rankin (Stroke Patients Only)       Balance Overall balance assessment: Needs assistance Sitting-balance support: Feet supported;No upper extremity supported Sitting balance-Leahy Scale: Good     Standing balance support: Bilateral upper extremity supported Standing balance-Leahy Scale: Fair                               Pertinent Vitals/Pain Pain Assessment: No/denies pain    Home Living Family/patient expects to be discharged to:: Private residence Living Arrangements: Spouse/significant other;Children Available Help at Discharge: Family Type of Home: House Home Access: Stairs to enter Entrance Stairs-Rails: None Entrance Stairs-Number of Steps: 2 Home Layout: One level Home Equipment: Cane - single point;Walker - 4 wheels;Bedside commode Additional Comments: between husband and children, someone can be home to provide 24 hour assist upon d/c    Prior Function Level of Independence: Needs assistance   Gait / Transfers Assistance Needed: typically uses SPC for mobility, however on "bad" days will use rollater           Hand Dominance        Extremity/Trunk Assessment  Upper Extremity Assessment Upper Extremity Assessment: LUE deficits/detail(R UE grossly 4+/5) LUE Deficits / Details: L grip 4/5; elbow flexion/extension 4-/5; shoulder flexion 3+/5 LUE Sensation: decreased light touch(shoulder distally-burning sensation) LUE Coordination: decreased  fine motor;decreased gross motor    Lower Extremity Assessment Lower Extremity Assessment: LLE deficits/detail(R LE grossly 4+/5) LLE Deficits / Details: dorsiflexion 3/5; knee flexion/extension 3+/5 LLE Sensation: decreased light touch(knee distal to foot) LLE Coordination: decreased gross motor       Communication   Communication: Expressive difficulties  Cognition Arousal/Alertness: Awake/alert Behavior During Therapy: WFL for tasks assessed/performed Overall Cognitive Status: Within Functional Limits for tasks assessed                                        General Comments      Exercises Other Exercises Other Exercises: educated on HEP, however unable to perform due to nausea symptoms   Assessment/Plan    PT Assessment Patient needs continued PT services  PT Problem List Decreased strength;Decreased balance;Decreased activity tolerance;Decreased mobility;Impaired sensation       PT Treatment Interventions DME instruction;Gait training;Stair training;Therapeutic exercise;Balance training    PT Goals (Current goals can be found in the Care Plan section)  Acute Rehab PT Goals Patient Stated Goal: to get back to normal PT Goal Formulation: With patient Time For Goal Achievement: 09/03/18 Potential to Achieve Goals: Good    Frequency 7X/week   Barriers to discharge        Co-evaluation               AM-PAC PT "6 Clicks" Daily Activity  Outcome Measure Difficulty turning over in bed (including adjusting bedclothes, sheets and blankets)?: None Difficulty moving from lying on back to sitting on the side of the bed? : None Difficulty sitting down on and standing up from a chair with arms (e.g., wheelchair, bedside commode, etc,.)?: Unable Help needed moving to and from a bed to chair (including a wheelchair)?: A Little Help needed walking in hospital room?: A Little Help needed climbing 3-5 steps with a railing? : A Lot 6 Click Score: 17     End of Session Equipment Utilized During Treatment: Gait belt Activity Tolerance: Patient tolerated treatment well Patient left: in bed;with family/visitor present Nurse Communication: Mobility status PT Visit Diagnosis: Unsteadiness on feet (R26.81);Muscle weakness (generalized) (M62.81);Difficulty in walking, not elsewhere classified (R26.2);Other symptoms and signs involving the nervous system (R29.898);Hemiplegia and hemiparesis Hemiplegia - Right/Left: Left Hemiplegia - dominant/non-dominant: Non-dominant Hemiplegia - caused by: Cerebral infarction    Time: 7124-5809 PT Time Calculation (min) (ACUTE ONLY): 27 min   Charges:   PT Evaluation $PT Eval Low Complexity: 1 Low PT Treatments $Therapeutic Activity: 8-22 mins        Greggory Stallion, PT, DPT [phone]   Lauren Winters 08/20/2018, 10:35 AM

## 2018-08-21 ENCOUNTER — Inpatient Hospital Stay
Admit: 2018-08-21 | Discharge: 2018-08-21 | Disposition: A | Discharge status: Home / Self Care | Payer: BLUE CROSS/BLUE SHIELD | Attending: Internal Medicine | Admitting: Internal Medicine

## 2018-08-21 DIAGNOSIS — E669 Obesity, unspecified: Secondary | ICD-10-CM | POA: Diagnosis present

## 2018-08-21 DIAGNOSIS — M329 Systemic lupus erythematosus, unspecified: Secondary | ICD-10-CM | POA: Diagnosis present

## 2018-08-21 DIAGNOSIS — M069 Rheumatoid arthritis, unspecified: Secondary | ICD-10-CM | POA: Diagnosis present

## 2018-08-21 DIAGNOSIS — Z794 Long term (current) use of insulin: Secondary | ICD-10-CM | POA: Diagnosis not present

## 2018-08-21 DIAGNOSIS — Z8249 Family history of ischemic heart disease and other diseases of the circulatory system: Secondary | ICD-10-CM | POA: Diagnosis not present

## 2018-08-21 DIAGNOSIS — F418 Other specified anxiety disorders: Secondary | ICD-10-CM | POA: Diagnosis present

## 2018-08-21 DIAGNOSIS — Z6835 Body mass index (BMI) 35.0-35.9, adult: Secondary | ICD-10-CM | POA: Diagnosis not present

## 2018-08-21 DIAGNOSIS — E785 Hyperlipidemia, unspecified: Secondary | ICD-10-CM | POA: Diagnosis present

## 2018-08-21 DIAGNOSIS — E119 Type 2 diabetes mellitus without complications: Secondary | ICD-10-CM | POA: Diagnosis present

## 2018-08-21 DIAGNOSIS — Z881 Allergy status to other antibiotic agents status: Secondary | ICD-10-CM | POA: Diagnosis not present

## 2018-08-21 DIAGNOSIS — I6381 Other cerebral infarction due to occlusion or stenosis of small artery: Secondary | ICD-10-CM | POA: Diagnosis present

## 2018-08-21 DIAGNOSIS — M797 Fibromyalgia: Secondary | ICD-10-CM | POA: Diagnosis present

## 2018-08-21 DIAGNOSIS — F419 Anxiety disorder, unspecified: Secondary | ICD-10-CM | POA: Diagnosis present

## 2018-08-21 DIAGNOSIS — Z888 Allergy status to other drugs, medicaments and biological substances status: Secondary | ICD-10-CM | POA: Diagnosis not present

## 2018-08-21 DIAGNOSIS — I340 Nonrheumatic mitral (valve) insufficiency: Secondary | ICD-10-CM | POA: Diagnosis present

## 2018-08-21 DIAGNOSIS — Z823 Family history of stroke: Secondary | ICD-10-CM | POA: Diagnosis not present

## 2018-08-21 DIAGNOSIS — Z91048 Other nonmedicinal substance allergy status: Secondary | ICD-10-CM | POA: Diagnosis not present

## 2018-08-21 DIAGNOSIS — I7300 Raynaud's syndrome without gangrene: Secondary | ICD-10-CM | POA: Diagnosis present

## 2018-08-21 DIAGNOSIS — Z79899 Other long term (current) drug therapy: Secondary | ICD-10-CM | POA: Diagnosis not present

## 2018-08-21 DIAGNOSIS — M3500 Sicca syndrome, unspecified: Secondary | ICD-10-CM | POA: Diagnosis present

## 2018-08-21 DIAGNOSIS — I10 Essential (primary) hypertension: Secondary | ICD-10-CM | POA: Diagnosis present

## 2018-08-21 DIAGNOSIS — R202 Paresthesia of skin: Secondary | ICD-10-CM | POA: Diagnosis present

## 2018-08-21 DIAGNOSIS — Z981 Arthrodesis status: Secondary | ICD-10-CM | POA: Diagnosis not present

## 2018-08-21 DIAGNOSIS — I639 Cerebral infarction, unspecified: Secondary | ICD-10-CM | POA: Diagnosis present

## 2018-08-21 DIAGNOSIS — Z90710 Acquired absence of both cervix and uterus: Secondary | ICD-10-CM | POA: Diagnosis not present

## 2018-08-21 LAB — BASIC METABOLIC PANEL
Anion gap: 9 (ref 5–15)
BUN: 25 mg/dL — AB (ref 6–20)
CALCIUM: 8.8 mg/dL — AB (ref 8.9–10.3)
CHLORIDE: 97 mmol/L — AB (ref 98–111)
CO2: 30 mmol/L (ref 22–32)
Creatinine, Ser: 1.3 mg/dL — ABNORMAL HIGH (ref 0.44–1.00)
GFR calc Af Amer: 53 mL/min — ABNORMAL LOW (ref >60)
GFR calc non Af Amer: 46 mL/min — ABNORMAL LOW (ref >60)
GLUCOSE: 273 mg/dL — AB (ref 70–99)
Potassium: 4 mmol/L (ref 3.5–5.1)
Sodium: 136 mmol/L (ref 135–145)

## 2018-08-21 LAB — ECHOCARDIOGRAM COMPLETE
HEIGHTINCHES: 66 in
WEIGHTICAEL: 3520 [oz_av]

## 2018-08-21 LAB — GLUCOSE, CAPILLARY
GLUCOSE-CAPILLARY: 217 mg/dL — AB (ref 70–99)
Glucose-Capillary: 230 mg/dL — ABNORMAL HIGH (ref 70–99)

## 2018-08-21 MED ORDER — ASPIRIN EC 81 MG PO TBEC: 81.0000 mg | DELAYED_RELEASE_TABLET | Freq: Every day | ORAL | 2 refills | Status: AC

## 2018-08-21 MED ORDER — ROSUVASTATIN CALCIUM 10 MG PO TABS: 10.0000 mg | ORAL_TABLET | Freq: Every day | ORAL | 2 refills | Status: DC

## 2018-08-21 MED ORDER — ENOXAPARIN SODIUM 40 MG/0.4ML ~~LOC~~ SOLN: 40.0000 mg | SUBCUTANEOUS | Status: DC

## 2018-08-21 NOTE — Progress Notes (Signed)
Discharge instructions along with home medications and follow up gone over with patient and husband. Both verbalize that they understood instructions. No prescriptions given to patient. IV and tele removed. Pt being discharged home on room air, no distress noted. Ammie Dalton, RN

## 2018-08-21 NOTE — Evaluation (Signed)
Occupational Therapy Evaluation Patient Details Name: Lauren Winters MRN: [id number] DOB: [birth year] Today's Date: 08/21/2018    History of Present Illness Pt admitted for complaints of L side numbness intermittently, now consistently. Now diagnosed with R CVA. HIstory includes DM, depression, multiple back Sx, fibromyalgia, RA, and lupus.    Clinical Impression   Pt is 52 year old woman who lives at home with her husband and 58 y/o daughter with hx of Lupus and Fibromyalgia and now with new onset R thalamic CVA with L side weakness and numbness and tingling.  Pt is cooperative and concerned about her function moving forward since she enjoys doing metal work and needs both of her hands to function.  Pt had just finished working with PT and was sitting at EOB for beginning of session.  She ambulated to sink to complete grooming and hygiene skills with min assist and cues for balance and then transitioned to recliner to comb hair with assist for braiding.  She presents with intact sensation and proprioception despite numbness and tingling and delay in movement pattern when testing proprioception.  Increased burning sensation and tingling with use of L hand but was using it for B ADL tasks.  Educated in safety when using L hand for ADLs and initiated home exercise program for fine motor re-training and neuromuscular re-ed.  Pt is able to move L UE and hand but has decreased coordination and stamina.   Rec continued OT while in hospital to continue to work on increasing independence in ADLs, balance and functional mobility training, coordination exercises and family ed and training and [facility] OT after discharge. Pt has adequate help at home from her husband and daughter for safe DC home.    Follow Up Recommendations  Home health OT    Equipment Recommendations  Tub/shower seat    Recommendations for Other Services       Precautions / Restrictions Precautions Precautions: Fall Restrictions Weight  Bearing Restrictions: No      Mobility Bed Mobility                  Transfers                      Balance                                           ADL either performed or assessed with clinical judgement   ADL Overall ADL's : Needs assistance/impaired Eating/Feeding: Set up;Minimal assistance Eating/Feeding Details (indicate cue type and reason): to open packages Grooming: Wash/dry hands;Wash/dry face;Oral care;Applying deodorant;Brushing hair;Set up;Independent Grooming Details (indicate cue type and reason): needs more time due to L hand with increased pain and tingling when using it Upper Body Bathing: Set up;Independent   Lower Body Bathing: Minimal assistance;Set up;Supervison/ safety Lower Body Bathing Details (indicate cue type and reason): supervision for balance  Upper Body Dressing : Set up;Minimal assistance Upper Body Dressing Details (indicate cue type and reason): unable to complete buttons  Lower Body Dressing: Set up;Moderate assistance Lower Body Dressing Details (indicate cue type and reason): due to decreased balance and impaired use of LUE and hand Toilet Transfer: Minimal assistance;Set up;Stand-pivot;Regular Toilet;Grab bars Toilet Transfer Details (indicate cue type and reason): requires close supervision and occasional min assist for balance and problem solving while walking and navigating tight spaces  General ADL Comments: Pt has a hx of Lupus and fibromyalgia with weakness overall and needs close supervision and cues for navigating around bed to bathroom.  Back surgery in May and limited trunk flexion to reach feet--consider training in AD as needed. Headache increases with standing and ambulation.     Vision Patient Visual Report: No change from baseline       Perception     Praxis      Pertinent Vitals/Pain Pain Assessment: No/denies pain     Hand Dominance Right   Extremity/Trunk  Assessment Upper Extremity Assessment LUE Deficits / Details: L grip 4/5; elbow flexion/extension 4-/5; shoulder flexion 3+/5; intact proprioception and light touch this session but has numbness and tingling LUE Coordination: decreased fine motor;decreased gross motor   Lower Extremity Assessment Lower Extremity Assessment: Defer to PT evaluation       Communication Communication Communication: No difficulties   Cognition Arousal/Alertness: Awake/alert Behavior During Therapy: WFL for tasks assessed/performed Overall Cognitive Status: Within Functional Limits for tasks assessed                                     General Comments       Exercises     Shoulder Instructions      Home Living Family/patient expects to be discharged to:: Private residence Living Arrangements: Spouse/significant other;Children Available Help at Discharge: Family Type of Home: House Home Access: Stairs to enter Technical brewer of Steps: 2 Entrance Stairs-Rails: None Home Layout: One level     Bathroom Shower/Tub: Occupational psychologist: Standard     Home Equipment: Cane - single point;Walker - 4 wheels;Bedside commode   Additional Comments: between husband and children, someone can be home to provide 24 hour assist upon d/c      Prior Functioning/Environment Level of Independence: Needs assistance  Gait / Transfers Assistance Needed: typically uses SPC for mobility, however on "bad" days will use rollater ADL's / Homemaking Assistance Needed: assist with donning shoes   Comments: RW and cane, primarily used cane        OT Problem List: Decreased strength;Pain;Impaired balance (sitting and/or standing);Decreased range of motion;Decreased coordination;Decreased activity tolerance;Impaired sensation;Impaired UE functional use      OT Treatment/Interventions: Self-care/ADL training;Therapeutic exercise;Patient/family education;Neuromuscular  education;Therapeutic activities;Balance training    OT Goals(Current goals can be found in the care plan section) Acute Rehab OT Goals Patient Stated Goal: to get feeling and function back in my L side OT Goal Formulation: With patient Time For Goal Achievement: 09/04/18 Potential to Achieve Goals: Good ADL Goals Pt Will Perform Upper Body Dressing: with set-up;with min guard assist;sitting Pt Will Perform Lower Body Dressing: with supervision;with set-up;sit to/from stand Pt Will Transfer to Toilet: with set-up;with supervision;ambulating;regular height toilet;stand pivot transfer Pt/caregiver will Perform Home Exercise Program: Left upper extremity;With theraputty;With written HEP provided  OT Frequency: Min 2X/week   Barriers to D/C:            Co-evaluation              AM-PAC PT "6 Clicks" Daily Activity     Outcome Measure Help from another person eating meals?: A Little Help from another person taking care of personal grooming?: None Help from another person toileting, which includes using toliet, bedpan, or urinal?: A Little Help from another person bathing (including washing, rinsing, drying)?: A Little Help from another person to put on and taking  off regular upper body clothing?: A Little Help from another person to put on and taking off regular lower body clothing?: A Lot 6 Click Score: 18   End of Session Equipment Utilized During Treatment: Gait belt  Activity Tolerance: Patient tolerated treatment well Patient left: in chair;Other (comment)(NSG present to give meds and notified that no chair alarm in place and wants to go back to bed and not sit up in chair due to increased back pain)  OT Visit Diagnosis: Unsteadiness on feet (R26.81);Muscle weakness (generalized) (M62.81);Hemiplegia and hemiparesis;Other symptoms and signs involving the nervous system (R29.898) Hemiplegia - Right/Left: Left Hemiplegia - dominant/non-dominant: Non-Dominant Hemiplegia -  caused by: Cerebral infarction                Time: 0930-1010 OT Time Calculation (min): 40 min Charges:  OT General Charges $OT Visit: 1 Visit OT Evaluation $OT Eval Low Complexity: 1 Low OT Treatments $Self Care/Home Management : 8-22 mins $Neuromuscular Re-education: 8-22 mins  Chrys Racer, OTR/L ascom [phone] 08/21/18, 10:27 AM

## 2018-08-21 NOTE — Progress Notes (Signed)
Physical Therapy Treatment Patient Details Name: Lauren Winters MRN: [id number] DOB: 1966/06/27 Today's Date: 08/21/2018    History of Present Illness Pt admitted for complaints of L side numbness intermittently, now consistently. Now diagnosed with R CVA. HIstory includes DM, depression, multiple back Sx, fibromyalgia, RA, and lupus.     PT Comments    Pt is progressing well towards goals today, but still c/o L sided decreased sensation and strength. Pt functional mobility is roughly same as Eval, but she was able to ambulate 81' with CGA/RW/Chair follow/1standing rest break, but pt did c/o headache with ambulation that did improve slightly with rest. PT noted diminished coordination, RAMPs, sensation, strength on L UE/LE compared to right, but roughly same as Eval per pt and note.PT educated pt on signs/symptoms of stroke especially if deficits increase to seek immediate medical attention, and pt verbalized understanding.  Current d/c recommendation remains appropriate and PT will continue to assess pt during hospital stay.  Follow Up Recommendations  Home health PT;Supervision for mobility/OOB     Equipment Recommendations  None recommended by PT    Recommendations for Other Services       Precautions / Restrictions Precautions Precautions: Fall Restrictions Weight Bearing Restrictions: No    Mobility  Bed Mobility Overal bed mobility: Needs Assistance Bed Mobility: Supine to Sit     Supine to sit: Supervision     General bed mobility comments: pt able to roll on L side and get to EOB with no rail and physical assist.  Transfers Overall transfer level: Needs assistance Equipment used: Rolling walker (2 wheeled) Transfers: Sit to/from Stand Sit to Stand: Min guard         General transfer comment: Pt required CGA for unsteadiness, but requires no cueing for appropriate use of RW with standing. Pt did benefit from increased time.  Ambulation/Gait Ambulation/Gait  assistance: Min guard Gait Distance (Feet): 50 Feet Assistive device: Rolling walker (2 wheeled) Gait Pattern/deviations: Step-to pattern     General Gait Details: Pt able to walk 50' with RW/CGA/chair follow/1 standing restbreak.  Pt c/o dizziness with ambilation that does not worsen and has mild imporvement with rest. Pt utilizes step to pattern with L LE leading and feels unsteady to progess to step through pattern. PT noted stepage type gait on L LE.   Stairs             Wheelchair Mobility    Modified Rankin (Stroke Patients Only)       Balance Overall balance assessment: Needs assistance Sitting-balance support: Feet supported;No upper extremity supported Sitting balance-Leahy Scale: Good     Standing balance support: Bilateral upper extremity supported Standing balance-Leahy Scale: Fair                              Cognition Arousal/Alertness: Awake/alert Behavior During Therapy: WFL for tasks assessed/performed Overall Cognitive Status: Within Functional Limits for tasks assessed                                        Exercises Other Exercises Other Exercises: Pt insructed in seated there.ex to include LAQ x10, hip felxion x10 all B LEs. Pt instructed in standing repeated marching x10 B LEs.    General Comments        Pertinent Vitals/Pain Pain Assessment: No/denies pain    Home Living Family/patient expects  to be discharged to:: Private residence Living Arrangements: Spouse/significant other;Children Available Help at Discharge: Family Type of Home: House Home Access: Stairs to enter Entrance Stairs-Rails: None Home Layout: One level [place] - single point;Walker - 4 wheels;Bedside commode Additional Comments: between husband and children, someone can be home to provide 24 hour assist upon d/c    Prior Function Level of Independence: Needs assistance  Gait / Transfers Assistance Needed: typically uses  SPC for mobility, however on "bad" days will use rollater ADL's / Homemaking Assistance Needed: assist with donning shoes Comments: RW and cane, primarily used cane   PT Goals (current goals can now be found in the care plan section) Acute Rehab PT Goals Patient Stated Goal: to get feeling and function back in my L side PT Goal Formulation: With patient Time For Goal Achievement: 09/03/18 Potential to Achieve Goals: Good Progress towards PT goals: Progressing toward goals    Frequency    7X/week      PT Plan Current plan remains appropriate    Co-evaluation              AM-PAC PT "6 Clicks" Daily Activity  Outcome Measure  Difficulty turning over in bed (including adjusting bedclothes, sheets and blankets)?: None Difficulty moving from lying on back to sitting on the side of the bed? : None Difficulty sitting down on and standing up from a chair with arms (e.g., wheelchair, bedside commode, etc,.)?: Unable Help needed moving to and from a bed to chair (including a wheelchair)?: A Little Help needed walking in hospital room?: A Little Help needed climbing 3-5 steps with a railing? : A Little 6 Click Score: 18    End of Session Equipment Utilized During Treatment: Gait belt Activity Tolerance: Patient limited by fatigue;Other (comment)(headache) Patient left: in bed;with call bell/phone within reach;Other (comment)(OT in room) Nurse Communication: Mobility status PT Visit Diagnosis: Unsteadiness on feet (R26.81);Muscle weakness (generalized) (M62.81);Difficulty in walking, not elsewhere classified (R26.2);Other symptoms and signs involving the nervous system (R29.898);Hemiplegia and hemiparesis Hemiplegia - Right/Left: Left Hemiplegia - dominant/non-dominant: Non-dominant Hemiplegia - caused by: Cerebral infarction     Time: 0912-0937 PT Time Calculation (min) (ACUTE ONLY): 25 min  Charges:                        Rosario Adie, SPT    Rosario Adie 08/21/2018, 10:29 AM

## 2018-08-21 NOTE — Progress Notes (Signed)
*  PRELIMINARY RESULTS* Echocardiogram 2D Echocardiogram has been performed.  Sherrie Sport 08/21/2018, 9:14 AM

## 2018-08-21 NOTE — Discharge Summary (Signed)
[facility] NAME: Lauren Winters    MR#:  [id number]  DATE OF BIRTH:  October 17, 1966  DATE OF ADMISSION:  08/19/2018   ADMITTING PHYSICIAN: Dustin Flock, MD  DATE OF DISCHARGE:  08/21/18  PRIMARY CARE PHYSICIAN: Pleas Koch, NP   ADMISSION DIAGNOSIS:   Cerebrovascular accident (CVA), unspecified mechanism ([place]) [I63.9]  DISCHARGE DIAGNOSIS:   Active Problems:   CVA (cerebral vascular accident) ([place])   SECONDARY DIAGNOSIS:   Past Medical History:  Diagnosis Date  . Anxiety   . Atypical chest pain    a. ETT 05/2014: normal stress test without evidence of myocardial ischemia or chest pain at peak stress  . Complication of anesthesia    migraine headache  . Depression   . Diabetes mellitus without complication ([place]) metformin   dx 2015  . Dyspnea    on exertion  . Fibromyalgia   . Headache   . Heart murmur    told back in 2007, but no mention with 2016 cardiac w/u  . Hypertension   . Lupus (systemic lupus erythematosus) ([place])   . Mitral regurgitation    a. 05/2014 echo: EF >55%, nl LV/RV sys fxn, mild MR/AR, no valvular stenosis   . Obesity (BMI 30-39.9)   . Raynaud's phenomenon   . Rheumatoid arthritis ([place])   . Sjogren's disease ([place])   . Spondylolisthesis of lumbar region   . Tobacco abuse    a. ongoing; b. ongoing SOB    HOSPITAL COURSE:   52 year old female with past medical history significant for systemic lupus erythematosus, diabetes, hypertension, fibromyalgia, Sjogren's syndrome presents to hospital secondary to left hand tingling and numbness.  1.  Acute thalamic infarct-patient comes with left-sided paresthesias and noted to have right lateral thalamic infarct on MRI. -Carotid Dopplers negative for any hemodynamically significant stenosis -Likely small vessel disease due to multiple risk factors. -Started on aspirin and increase the dose of statin -Appreciate neurology  consult. -Echocardiogram is done and pending -PT/OT consults- recommended home health.  Patient does have some spastic gait on ambulation.    2.  Hypertension-continue clonidine, norvasc, valsartan, hydrochlorothiazide, metoprolol  3.  Diabetes mellitus-on Lantus and  metformin  4.  SLE-stable.  Outpatient follow-up.  On methotrexate and Plaquenil  5.  Depression-continue home meds also patient has DDD and neck pain  Stable for discharge today   DISCHARGE CONDITIONS:   Guarded  CONSULTS OBTAINED:   Treatment Team:  Leotis Pain, MD  DRUG ALLERGIES:   Allergies  Allergen Reactions  . Pseudoephedrine Hcl Shortness Of Breath and Other (See Comments)    Room starts spinning, can't breath  . Clindamycin/Lincomycin Hives  . Ancef [Cefazolin] Other (See Comments)    Unknown  . Prednisone Other (See Comments)    Chest pain w/ cortisone shots or prednisone pills Elevated blood pressure  . Adhesive [Tape] Rash and Other (See Comments)    Sensitivity to certain band aids and adhesives - burns skin, makes skin raw  . Other Rash and Other (See Comments)    Sensitivity to certain band aids and adhesives - burns skin, makes skin raw  . Sudafed [Pseudoephedrine Hcl] Other (See Comments)    Dizziness    DISCHARGE MEDICATIONS:   Allergies as of 08/21/2018      Reactions   Pseudoephedrine Hcl Shortness Of Breath, Other (See Comments)   Room starts spinning, can't breath   Clindamycin/lincomycin Hives   Ancef [cefazolin] Other (See Comments)   Unknown  Prednisone Other (See Comments)   Chest pain w/ cortisone shots or prednisone pills Elevated blood pressure   Adhesive [tape] Rash, Other (See Comments)   Sensitivity to certain band aids and adhesives - burns skin, makes skin raw   Other Rash, Other (See Comments)   Sensitivity to certain band aids and adhesives - burns skin, makes skin raw   Sudafed [pseudoephedrine Hcl] Other (See Comments)   Dizziness        Medication List    STOP taking these medications   ondansetron 4 MG tablet Commonly known as:  ZOFRAN     TAKE these medications   amLODipine 10 MG tablet Commonly known as:  NORVASC Take 1 tablet once daily for blood pressure. What changed:    how much to take  how to take this  when to take this  additional instructions   aspirin EC 81 MG tablet Take 1 tablet (81 mg total) by mouth daily.   buPROPion 150 MG 24 hr tablet Commonly known as:  WELLBUTRIN XL Take 1 tablet by mouth once daily for anxiety and depression. What changed:    how much to take  how to take this  when to take this  additional instructions   clobetasol cream 0.05 % Commonly known as:  TEMOVATE Apply 1 application topically 2 (two) times daily as needed (for lupis rash).   cloNIDine 0.1 MG tablet Commonly known as:  CATAPRES Take 1 tablet by mouth twice daily for blood pressure. What changed:    how much to take  how to take this  when to take this  additional instructions   cyclobenzaprine 10 MG tablet Commonly known as:  FLEXERIL Take 1 tablet (10 mg total) by mouth 3 (three) times daily as needed for muscle spasms.   escitalopram 20 MG tablet Commonly known as:  LEXAPRO Take 20 mg by mouth daily.   gabapentin 300 MG capsule Commonly known as:  NEURONTIN Take 600 mg by mouth 3 (three) times daily as needed (for pain).   Halcinonide 0.1 % Oint Apply 1 application topically 2 (two) times daily as needed (for lupis rash).   hydroxychloroquine 200 MG tablet Commonly known as:  PLAQUENIL Take 1 tablet (200 mg total) by mouth 2 (two) times daily.   Insulin Glargine 100 UNIT/ML Solostar Pen Commonly known as:  LANTUS Inject 8 Units into the skin every evening. What changed:  when to take this   metFORMIN 500 MG 24 hr tablet Commonly known as:  GLUCOPHAGE-XR Take 2 tablets every morning with food for diabetes. What changed:    how much to take  how to take  this  when to take this  additional instructions   methotrexate 2.5 MG tablet Commonly known as:  RHEUMATREX Take 8 tablets (20 mg total) by mouth once a week. Caution:Chemotherapy. Protect from light. What changed:  when to take this   metoprolol tartrate 100 MG tablet Commonly known as:  LOPRESSOR Take 1 tablet by mouth twice daily for blood pressure. What changed:    how much to take  how to take this  when to take this   Pen Needles 31G X 6 MM Misc Use with insulin as directed.   pilocarpine 5 MG tablet Commonly known as:  SALAGEN Take 5 mg by mouth daily as needed (for dry mouth).   rosuvastatin 10 MG tablet Commonly known as:  CRESTOR Take 1 tablet (10 mg total) by mouth daily at 6 PM. Take 1 tablet by mouth  at bedtime for cholesterol. What changed:    medication strength  how much to take  how to take this  when to take this   tacrolimus 0.1 % ointment Commonly known as:  PROTOPIC Apply 1 application topically 2 (two) times daily as needed (for lupis rash).   triamcinolone 0.1 % cream : eucerin Crea Apply 1 application topically 2 (two) times daily as needed for rash or irritation.   valsartan-hydrochlorothiazide 320-25 MG tablet Commonly known as:  DIOVAN-HCT Take 1 tablet by mouth once daily for blood pressure. What changed:    how much to take  how to take this  when to take this  additional instructions   Vitamin D (Ergocalciferol) 50000 units Caps capsule Commonly known as:  DRISDOL Take 1 capsule (50,000 Units total) by mouth 2 (two) times a week. What changed:  when to take this        DISCHARGE INSTRUCTIONS:   1. PCP f/u in 1-2 weeks 2. Neurology f/u in 2-3 weeks  DIET:   Cardiac diet  ACTIVITY:   Activity as tolerated  OXYGEN:   Home Oxygen: No.  Oxygen Delivery: room air  DISCHARGE LOCATION:   home   If you experience worsening of your admission symptoms, develop shortness of breath, life threatening  emergency, suicidal or homicidal thoughts you must seek medical attention immediately by calling 911 or calling your MD immediately  if symptoms less severe.  You Must read complete instructions/literature along with all the possible adverse reactions/side effects for all the Medicines you take and that have been prescribed to you. Take any new Medicines after you have completely understood and accpet all the possible adverse reactions/side effects.   Please note  You were cared for by a hospitalist during your hospital stay. If you have any questions about your discharge medications or the care you received while you were in the hospital after you are discharged, you can call the unit and asked to speak with the hospitalist on call if the hospitalist that took care of you is not available. Once you are discharged, your primary care physician will handle any further medical issues. Please note that NO REFILLS for any discharge medications will be authorized once you are discharged, as it is imperative that you return to your primary care physician (or establish a relationship with a primary care physician if you do not have one) for your aftercare needs so that they can reassess your need for medications and monitor your lab values.    On the day of Discharge:  VITAL SIGNS:   Blood pressure 106/63, pulse 70, temperature 98.4 F (36.9 C), temperature source Oral, resp. rate 18, height 5\' 6"  (1.676 m), weight 99.8 kg, SpO2 96 %.  PHYSICAL EXAMINATION:   GENERAL:  52 y.o.-year-old patient lying in the bed with no acute distress.  EYES: Pupils equal, round, reactive to light and accommodation. No scleral icterus. Extraocular muscles intact.  HEENT: Head atraumatic, normocephalic. Oropharynx and nasopharynx clear.  NECK:  Supple, no jugular venous distention. No thyroid enlargement, no tenderness.  LUNGS: Normal breath sounds bilaterally, no wheezing, rales,rhonchi or crepitation. No use of  accessory muscles of respiration.  CARDIOVASCULAR: S1, S2 normal. No murmurs, rubs, or gallops.  ABDOMEN: Soft, nontender, nondistended. Bowel sounds present. No organomegaly or mass.  EXTREMITIES: No pedal edema, cyanosis, or clubbing.  NEUROLOGIC: Cranial nerves II through XII are intact. Muscle strength 5/5 in RUE and RLE extremities, some left sided drift noted. Sensation intact but has  a feeling of heaviness with some paresthesias of the left hand and leg.. Gait not checked.  PSYCHIATRIC: The patient is alert and oriented x 3.  SKIN: No obvious rash, lesion, or ulcer.     DATA REVIEW:   CBC Recent Labs  Lab 08/19/18 0522  WBC 6.4  HGB 13.1  HCT 38.0  PLT 369    Chemistries  Recent Labs  Lab 08/19/18 0522 08/21/18 0426  NA 136 136  K 3.6 4.0  CL 98 97*  CO2 29 30  GLUCOSE 273* 273*  BUN 25* 25*  CREATININE 1.02* 1.30*  CALCIUM 9.6 8.8*  AST 22  --   ALT 15  --   ALKPHOS 124  --   BILITOT 0.7  --      Microbiology Results  Results for orders placed or performed during the hospital encounter of 05/17/18  Surgical pcr screen     Status: None   Collection Time: 05/17/18  1:32 PM  Result Value Ref Range Status   MRSA, PCR NEGATIVE NEGATIVE Final   Staphylococcus aureus NEGATIVE NEGATIVE Final    Comment: (NOTE) The Xpert SA Assay (FDA approved for NASAL specimens in patients 57 years of age and older), is one component of a comprehensive surveillance program. It is not intended to diagnose infection nor to guide or monitor treatment. Performed at [facility], [place] [address], [place], [place] [address]     RADIOLOGY:  No results found.   Management plans discussed with the patient, family and they are in agreement.  CODE STATUS:     Code Status Orders  (From admission, onward)         Start     Ordered   08/19/18 1415  Full code  Continuous     08/19/18 1414        Code Status History    Date Active Date Inactive Code Status Order  ID Comments User Context   05/26/2018 1805 05/30/2018 1621 Full Code [id number]  Eustace Moore, MD Inpatient   01/11/2018 2235 01/16/2018 2117 Full Code [id number]  Eustace Moore, MD Inpatient   12/09/2017 1116 12/13/2017 1551 Full Code [id number]  Eustace Moore, MD Inpatient   03/29/2017 1100 03/31/2017 1824 Full Code [id number]  Traci Sermon, PA-C Inpatient      TOTAL TIME TAKING CARE OF THIS PATIENT: 38 minutes.    [facility] St[facility]ur M.D on 08/21/2018 at 12:10 PM  Between 7am to 6pm - Pager - [phone]  After 6pm go to [email or web address] - Proofreader  [facility]  Office  [phone]  CC: Primary care physician; Pleas Koch, NP   Note: This dictation was prepared with Dragon dictation along with smaller phrase technology. Any transcriptional errors that result from this process are unintentional.

## 2018-08-21 NOTE — Care Management Note (Signed)
Case Management Note  Patient Details  Name: Lauren Winters MRN: [id number] Date of Birth: 1966-12-10  Subjective/Objective:   Met with patient and her spouse at bedside to discuss discharge planning. Acute CVA. Will need PT and OT. Offered a list of home health agencies. [facility] prefers [facility]. Referral to [facility] with [facility] for PT and OT. No DME needs.  PCP is Alma Friendly at [facility].              Action/Plan: [facility] for PT/OT  Expected Discharge Date:  08/21/18               Expected Discharge Plan:  [place]  In-House Referral:     Discharge planning Services  CM Consult  Post Acute Care Choice:  Home Health Choice offered to:  Patient  DME Arranged:    DME Agency:     HH Arranged:  PT, OT HH Agency:  [place]  Status of Service:  Completed, signed off  If discussed at [place] of Stay Meetings, dates discussed:    Additional Comments:  Jolly Mango, RN 08/21/2018, 3:20 PM

## 2018-08-21 NOTE — Progress Notes (Signed)
SLP Cancellation Note  Patient Details Name: Lauren Winters MRN: [id number] DOB: 06-20-1966   Cancelled treatment:       Reason Eval/Treat Not Completed: SLP screened, no needs identified, will sign off(chart reviewed; consulted NSG then met w/ pt/husband). Pt denied any difficulty swallowing and is currently on a regular diet; tolerates swallowing pills w/ water per NSG. Pt conversed at conversational level w/out deficits noted; pt and husband denied any speech-language deficits, "no change in her speech". However, pt is c/o biting her tongue on the Left side intermittently while chewing foods; she also feels some decreased sensation in her Left face. It has been improving but she continues to bite her tongue. NO lingual weakness or asymmetry was noted during OM observation. Discussed strategies of using Small bites of foods; less meats and breads at this time to lessen mastication time/effort; more smooth foods/purees in her diet. Also encouraged her to suck on ice or popcicles for soothing the tongue. Instructed pt to f/u w/ PCP upon discharge if this does not improve in next 2-3 days.   No further skilled ST services indicated as pt appears at her baseline for speech and swallowing. Pt agreed. NSG to reconsult if any change in status while admitted.    Orinda Kenner, MS, CCC-SLP Kelin Nixon 08/21/2018, 1:22 PM

## 2018-08-22 ENCOUNTER — Other Ambulatory Visit (INDEPENDENT_AMBULATORY_CARE_PROVIDER_SITE_OTHER): Payer: BLUE CROSS/BLUE SHIELD

## 2018-08-22 ENCOUNTER — Other Ambulatory Visit: Payer: Self-pay | Admitting: Primary Care

## 2018-08-22 DIAGNOSIS — E119 Type 2 diabetes mellitus without complications: Secondary | ICD-10-CM

## 2018-08-22 LAB — POCT GLYCOSYLATED HEMOGLOBIN (HGB A1C): Hemoglobin A1C: 8.9 % — AB (ref 4.0–5.6)

## 2018-08-22 MED ORDER — INSULIN GLARGINE 100 UNIT/ML SOLOSTAR PEN: 12.0000 [IU] | PEN_INJECTOR | Freq: Every evening | SUBCUTANEOUS | 5 refills | Status: DC

## 2018-08-23 ENCOUNTER — Encounter: Payer: Self-pay | Admitting: Primary Care

## 2018-08-23 ENCOUNTER — Ambulatory Visit (INDEPENDENT_AMBULATORY_CARE_PROVIDER_SITE_OTHER): Payer: BLUE CROSS/BLUE SHIELD | Admitting: Primary Care

## 2018-08-23 DIAGNOSIS — I10 Essential (primary) hypertension: Secondary | ICD-10-CM | POA: Diagnosis not present

## 2018-08-23 DIAGNOSIS — E785 Hyperlipidemia, unspecified: Secondary | ICD-10-CM

## 2018-08-23 DIAGNOSIS — I639 Cerebral infarction, unspecified: Secondary | ICD-10-CM | POA: Diagnosis not present

## 2018-08-23 NOTE — Patient Instructions (Addendum)
Please call me if you don't hear from home health by next Monday.  You can increase your gabapentin dose by taking one extra pill once during the day as discussed. Please update me if your symptoms persist.  Follow up with the neurologist as scheduled. Continue aspirin and the increased dose of your cholesterol medication.   Schedule a follow up visit in 6 weeks for diabetes check and cholesterol check. Make sure to come fasting 4 hours prior. Bring your blood sugar logs.  It was a pleasure to see you today!

## 2018-08-23 NOTE — Progress Notes (Signed)
Subjective:    Patient ID: Lauren Winters, female    DOB: 10-30-1966, 52 y.o.   MRN: [id number]  HPI  Lauren Winters is a 52 year old female who presents today for hospital follow up.  She presented to to [facility] ED on 08/242/19 with a chief complaint of left sided numbness to the face, hand, and foot. Symptoms began one week prior with resolve and then return. She underwent CT head in the ED which was negative, therefore neurology was consulted and MRI brain was ordered. She was admitted for further evaluation.  During her hospital stay MRI was consistent for acute right lateral thalamic infarct. She underwent echocardiogram, carotid dopplers, PT/OT evaluation. Carotid dopplers were negative for hemodynamically significant stenosis. She was started on aspirin and her rosuvastatin dose was increased. She was discharged home on 08/21/18 and set up for home health PT/OT and outpatient neurology follow up.  Since her hospital stay she continues to experience left upper and lower extremity weakness and tingling. She is set up for home health for occupational and physical therapy and is awaiting a phone call. She is compliant to the increased dose of her rosuvastatin.   BP Readings from Last 3 Encounters:  08/23/18 108/70  08/21/18 106/63  08/01/18 (!) 148/94     Review of Systems  Eyes: Negative for visual disturbance.  Respiratory: Negative for shortness of breath.   Cardiovascular: Negative for chest pain.  Neurological: Positive for weakness and numbness. Negative for dizziness and headaches.       Past Medical History:  Diagnosis Date  . Anxiety   . Atypical chest pain    a. ETT 05/2014: normal stress test without evidence of myocardial ischemia or chest pain at peak stress  . Complication of anesthesia    migraine headache  . Depression   . Diabetes mellitus without complication ([place]) metformin   dx 2015  . Dyspnea    on exertion  . Fibromyalgia   . Headache   . Heart murmur     told back in 2007, but no mention with 2016 cardiac w/u  . Hypertension   . Lupus (systemic lupus erythematosus) ([place])   . Mitral regurgitation    a. 05/2014 echo: EF >55%, nl LV/RV sys fxn, mild MR/AR, no valvular stenosis   . Obesity (BMI 30-39.9)   . Raynaud's phenomenon   . Rheumatoid arthritis ([place])   . Sjogren's disease ([place])   . Spondylolisthesis of lumbar region   . Tobacco abuse    a. ongoing; b. ongoing SOB     Social History   Socioeconomic History  . Marital status: Married    Spouse name: Not on file  . Number of children: Not on file  . Years of education: Not on file  . Highest education level: Not on file  Occupational History  . Not on file  Social Needs  . Financial resource strain: Not on file  . Food insecurity:    Worry: Not on file    Inability: Not on file  . Transportation needs:    Medical: Not on file    Non-medical: Not on file  Tobacco Use  . Smoking status: Former Smoker    Packs/day: 0.50    Years: 10.00    Pack years: 5.00    Types: Cigarettes    Last attempt to quit: 07/22/2015    Years since quitting: 3.0  . Smokeless tobacco: Never Used  Substance and Sexual Activity  . Alcohol use: No  Alcohol/week: 0.0 standard drinks  . Drug use: No  . Sexual activity: Not on file  Lifestyle  . Physical activity:    Days per week: Not on file    Minutes per session: Not on file  . Stress: Not on file  Relationships  . Social connections:    Talks on phone: Not on file    Gets together: Not on file    Attends religious service: Not on file    Active member of club or organization: Not on file    Attends meetings of clubs or organizations: Not on file    Relationship status: Not on file  . Intimate partner violence:    Fear of current or ex partner: Not on file    Emotionally abused: Not on file    Physically abused: Not on file    Forced sexual activity: Not on file  Other Topics Concern  . Not on file  Social History Narrative     Married.   3 children.   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     Past Surgical History:  Procedure Laterality Date  . ABDOMINAL HYSTERECTOMY    . BACK SURGERY    . CESAREAN SECTION     x3  . LUMBAR LAMINECTOMY    . LUMBAR LAMINECTOMY/DECOMPRESSION MICRODISCECTOMY N/A 03/29/2017   Procedure: Re operative Right Lumbar Three-Four Laminectomy and discectomy with Left Lumbar Three-Four, Bilateral Lumbar Four-Five Laminectomy for decompression;  Surgeon: Kevan Ny Ditty, MD;  Location: [place];  Service: Neurosurgery;  Laterality: N/A;  . SPINAL CORD STIMULATOR INSERTION  12/09/2017   Procedure: LUMBAR SPINAL CORD STIMULATOR IMPLANT;  Surgeon: Eustace Moore, MD;  Location: [place];  Service: Neurosurgery;;  . SPINAL CORD STIMULATOR REMOVAL N/A 01/11/2018   Procedure: LUMBAR SPINAL CORD STIMULATOR REMOVAL;  Surgeon: Eustace Moore, MD;  Location: [place];  Service: Neurosurgery;  Laterality: N/A;  . TUBAL LIGATION      Family History  Problem Relation Age of Onset  . Hypertension Mother   . Valvular heart disease Mother   . CAD Father 74       CABG x 4  . Liver cancer Father 43       passed  . CAD Paternal Aunt        CABG  . Breast cancer Paternal Aunt   . CAD Paternal Aunt        CABG  . Breast cancer Paternal Aunt   . CAD Paternal Aunt        CABG  . Breast cancer Paternal Aunt   . CAD Paternal Aunt        CABG  . Breast cancer Paternal Aunt   . CAD Paternal Aunt        CABG  . Breast cancer Paternal Aunt   . CAD Paternal Uncle        MI s/p CABG  . Stroke Maternal Grandmother   . Stroke Maternal Grandfather   . CAD Maternal Grandfather     Allergies  Allergen Reactions  . Pseudoephedrine Hcl Shortness Of Breath and Other (See Comments)    Room starts spinning, can't breath  . Clindamycin/Lincomycin Hives  . Ancef [Cefazolin] Other (See Comments)    Unknown  . Prednisone Other (See Comments)    Chest pain w/ cortisone  shots or prednisone pills Elevated blood pressure  . Adhesive [Tape] Rash and Other (See Comments)    Sensitivity to certain band aids and adhesives -  burns skin, makes skin raw  . Other Rash and Other (See Comments)    Sensitivity to certain band aids and adhesives - burns skin, makes skin raw  . Sudafed [Pseudoephedrine Hcl] Other (See Comments)    Dizziness     Current Outpatient Medications on File Prior to Visit  Medication Sig Dispense Refill  . amLODipine (NORVASC) 10 MG tablet Take 1 tablet once daily for blood pressure. (Patient taking differently: Take 10 mg by mouth daily. ) 90 tablet 3  . aspirin EC 81 MG tablet Take 1 tablet (81 mg total) by mouth daily. 30 tablet 2  . buPROPion (WELLBUTRIN XL) 150 MG 24 hr tablet Take 1 tablet by mouth once daily for anxiety and depression. (Patient taking differently: Take 150 mg by mouth daily. ) 90 tablet 3  . clobetasol cream (TEMOVATE) 9.56 % Apply 1 application topically 2 (two) times daily as needed (for lupis rash).    . cloNIDine (CATAPRES) 0.1 MG tablet Take 1 tablet by mouth twice daily for blood pressure. (Patient taking differently: Take 0.1 mg by mouth 2 (two) times daily. ) 180 tablet 3  . cyclobenzaprine (FLEXERIL) 10 MG tablet Take 1 tablet (10 mg total) by mouth 3 (three) times daily as needed for muscle spasms. 270 tablet 0  . escitalopram (LEXAPRO) 20 MG tablet Take 20 mg by mouth daily.    Marland Kitchen gabapentin (NEURONTIN) 300 MG capsule Take 600 mg by mouth 3 (three) times daily as needed (for pain).   0  . Halcinonide 0.1 % OINT Apply 1 application topically 2 (two) times daily as needed (for lupis rash).     . hydroxychloroquine (PLAQUENIL) 200 MG tablet Take 1 tablet (200 mg total) by mouth 2 (two) times daily. 60 tablet 0  . Insulin Glargine (LANTUS) 100 UNIT/ML Solostar Pen Inject 12 Units into the skin every evening. 15 mL 5  . Insulin Pen Needle (PEN NEEDLES) 31G X 6 MM MISC Use with insulin as directed. 100 each 5  .  metFORMIN (GLUCOPHAGE-XR) 500 MG 24 hr tablet Take 2 tablets every morning with food for diabetes. (Patient taking differently: Take 1,000 mg by mouth daily with breakfast. ) 180 tablet 3  . methotrexate (RHEUMATREX) 2.5 MG tablet Take 8 tablets (20 mg total) by mouth once a week. Caution:Chemotherapy. Protect from light. (Patient taking differently: Take 20 mg by mouth every Tuesday. Caution:Chemotherapy. Protect from light.) 96 tablet 0  . metoprolol tartrate (LOPRESSOR) 100 MG tablet Take 1 tablet by mouth twice daily for blood pressure. (Patient taking differently: Take 100 mg by mouth 2 (two) times daily. Take 1 tablet by mouth twice daily for blood pressure.) 180 tablet 3  . pilocarpine (SALAGEN) 5 MG tablet Take 5 mg by mouth daily as needed (for dry mouth).     . rosuvastatin (CRESTOR) 10 MG tablet Take 1 tablet (10 mg total) by mouth daily at 6 PM. Take 1 tablet by mouth at bedtime for cholesterol. 30 tablet 2  . tacrolimus (PROTOPIC) 0.1 % ointment Apply 1 application topically 2 (two) times daily as needed (for lupis rash).     . Triamcinolone Acetonide (TRIAMCINOLONE 0.1 % CREAM : EUCERIN) CREA Apply 1 application topically 2 (two) times daily as needed for rash or irritation.     . valsartan-hydrochlorothiazide (DIOVAN-HCT) 320-25 MG tablet Take 1 tablet by mouth once daily for blood pressure. (Patient taking differently: Take 1 tablet by mouth at bedtime. ) 90 tablet 3  . Vitamin D, Ergocalciferol, (DRISDOL) 50000  units CAPS capsule Take 1 capsule (50,000 Units total) by mouth 2 (two) times a week. (Patient taking differently: Take 50,000 Units by mouth every Friday. ) 24 capsule 0   No current facility-administered medications on file prior to visit.     BP 108/70   Pulse 69   Temp 98.2 F (36.8 C) (Oral)   Ht 5\' 6"  (1.676 m)   Wt 222 lb 8 oz (100.9 kg)   SpO2 99%   BMI 35.91 kg/m    Objective:   Physical Exam  Constitutional: She appears well-nourished.  Neck: Neck supple.   Cardiovascular: Normal rate and regular rhythm.  Respiratory: Effort normal and breath sounds normal.  Neurological:  Left upper and lower extremity weakness noted per hand grip strength and ambulation. Speech clear. Decreased sensation to left cheek.   Skin: Skin is warm and dry.  Psychiatric: She has a normal mood and affect.           Assessment & Plan:

## 2018-08-24 ENCOUNTER — Ambulatory Visit: Payer: BLUE CROSS/BLUE SHIELD | Admitting: Psychology

## 2018-08-25 NOTE — Assessment & Plan Note (Addendum)
Right lacunar thalamic infarct on MRI. Residual weakness and numbness noted on exam. Continue Crestor 10 mg and aspirin. She will work with home PT/OT and follow up with neurology as scheduled.  Repeat lipids in 6 weeks.  All hospital notes, labs, imaging reviewed.

## 2018-08-25 NOTE — Assessment & Plan Note (Signed)
Crestor increased to 10 mg during hospital stay, continue same. Repeat lipids in 6 weeks.

## 2018-08-25 NOTE — Assessment & Plan Note (Signed)
Stable in the office today, continue current regimen. 

## 2018-08-31 ENCOUNTER — Ambulatory Visit: Payer: BLUE CROSS/BLUE SHIELD | Admitting: Psychology

## 2018-09-01 ENCOUNTER — Telehealth: Payer: Self-pay | Admitting: Primary Care

## 2018-09-01 DIAGNOSIS — I639 Cerebral infarction, unspecified: Secondary | ICD-10-CM

## 2018-09-01 NOTE — Telephone Encounter (Signed)
Noted, referral placed.  

## 2018-09-01 NOTE — Telephone Encounter (Signed)
Copied from [place] [id number]. Topic: Quick Communication - See Telephone Encounter >> Sep 01, 2018  4:18 PM Vernona Rieger wrote: CRM for notification. See Telephone encounter for: 09/01/18.  Raquel Sarna, physical therapist with [facility] called and said that she went to see her today for her first visit. She thinks that she would benefit more from outpatient therapy for physical and occupational. She would like to know if Alma Friendly could put in a referral for her. Could that be sent to the physical therapy dept at [facility] , fax number [phone]. Raquel Sarna said she will not see her after today. Please call Raquel Sarna and let her know after that is sent [phone]

## 2018-09-04 NOTE — Telephone Encounter (Signed)
Message left for Lauren Winters to return my call.

## 2018-09-04 NOTE — Telephone Encounter (Signed)
Lauren Winters has been inform of the referral for patient.

## 2018-09-05 ENCOUNTER — Other Ambulatory Visit: Payer: Self-pay | Admitting: Primary Care

## 2018-09-05 DIAGNOSIS — I639 Cerebral infarction, unspecified: Secondary | ICD-10-CM

## 2018-09-07 ENCOUNTER — Ambulatory Visit: Payer: BLUE CROSS/BLUE SHIELD | Admitting: Psychology

## 2018-09-12 ENCOUNTER — Other Ambulatory Visit: Payer: Self-pay

## 2018-09-12 ENCOUNTER — Encounter: Payer: Self-pay | Admitting: Occupational Therapy

## 2018-09-12 ENCOUNTER — Ambulatory Visit: Payer: BLUE CROSS/BLUE SHIELD | Attending: Primary Care | Admitting: Occupational Therapy

## 2018-09-12 DIAGNOSIS — M6281 Muscle weakness (generalized): Secondary | ICD-10-CM | POA: Diagnosis present

## 2018-09-12 DIAGNOSIS — R208 Other disturbances of skin sensation: Secondary | ICD-10-CM | POA: Diagnosis present

## 2018-09-12 DIAGNOSIS — R278 Other lack of coordination: Secondary | ICD-10-CM | POA: Diagnosis present

## 2018-09-13 ENCOUNTER — Encounter: Payer: Self-pay | Admitting: Occupational Therapy

## 2018-09-13 NOTE — Therapy (Signed)
[place] MAIN [facility] SERVICES [address] [place], [place], [address] Phone: [phone]   Fax:  [phone]  Occupational Therapy Evaluation  Patient Details  Name: ARLISA LECLERE MRN: [id number] Date of Birth: [birth year] Referring Provider: Carlis Abbott   Encounter Date: 09/12/2018  OT End of Session - 09/13/18 0904    Visit Number  1    Number of Visits  24    Date for OT Re-Evaluation  12/06/18    Authorization Type  visist 1 of 10 for progress report period starting 09/12/2018    OT Start Time  1505    OT Stop Time  1605    OT Time Calculation (min)  60 min    Activity Tolerance  Patient limited by pain    Behavior During Therapy  [facility] for tasks assessed/performed       Past Medical History:  Diagnosis Date  . Anxiety   . Atypical chest pain    a. ETT 05/2014: normal stress test without evidence of myocardial ischemia or chest pain at peak stress  . Complication of anesthesia    migraine headache  . Depression   . Diabetes mellitus without complication ([place]) metformin   dx 2015  . Dyspnea    on exertion  . Fibromyalgia   . Headache   . Heart murmur    told back in 2007, but no mention with 2016 cardiac w/u  . Hypertension   . Lupus (systemic lupus erythematosus) ([place])   . Mitral regurgitation    a. 05/2014 echo: EF >55%, nl LV/RV sys fxn, mild MR/AR, no valvular stenosis   . Obesity (BMI 30-39.9)   . Raynaud's phenomenon   . Rheumatoid arthritis ([place])   . Sjogren's disease ([place])   . Spondylolisthesis of lumbar region   . Tobacco abuse    a. ongoing; b. ongoing SOB    Past Surgical History:  Procedure Laterality Date  . ABDOMINAL HYSTERECTOMY    . BACK SURGERY    . CESAREAN SECTION     x3  . LUMBAR LAMINECTOMY    . LUMBAR LAMINECTOMY/DECOMPRESSION MICRODISCECTOMY N/A 03/29/2017   Procedure: Re operative Right Lumbar Three-Four Laminectomy and discectomy with Left Lumbar Three-Four, Bilateral Lumbar Four-Five Laminectomy  for decompression;  Surgeon: Kevan Ny Ditty, MD;  Location: [place];  Service: Neurosurgery;  Laterality: N/A;  . SPINAL CORD STIMULATOR INSERTION  12/09/2017   Procedure: LUMBAR SPINAL CORD STIMULATOR IMPLANT;  Surgeon: Eustace Moore, MD;  Location: [place];  Service: Neurosurgery;;  . SPINAL CORD STIMULATOR REMOVAL N/A 01/11/2018   Procedure: LUMBAR SPINAL CORD STIMULATOR REMOVAL;  Surgeon: Eustace Moore, MD;  Location: [place];  Service: Neurosurgery;  Laterality: N/A;  . TUBAL LIGATION      There were no vitals filed for this visit.  Subjective Assessment - 09/12/18 1643    Subjective   Pt. was present for the initial eval alone today.    Pertinent History  Pt is a 52 y.o. female that lives with husband and daughter. Pt was admitted to [facility] at the end of Aug presenting with a CVA, a punctate acute infarction in the lateral right [place]. Pt was discharged after 3 days and received home health PT that recommended outpatient therapy. Prior to CVA, pt required increased assistance for ADLs and IADLs due to limitations secondary to complications with back pain. Pt desires to increase independence for daily tasks to promote QoL and decrease caregiver burden    Currently in Pain?  Yes    Pain Score  8    Pt. reports go to 10/10 at times.   Pain Location  Hand   volar surface of the hand from the tips of the digits to the forearm.   Pain Orientation  Left    Pain Descriptors / Indicators  Burning   stinging   Pain Type  Acute pain    Pain Onset  1 to 4 weeks ago   Since the onset of the stroke       [facility] OT Assessment - 09/12/18 1518      Assessment   Medical Diagnosis  CVA    Referring Provider  Carlis Abbott    Onset Date/Surgical Date  08/19/18    Hand Dominance  Right    Next MD Visit  Neurologist  on 09/20/2018    Prior Therapy  No      Precautions   Precautions  Back;None   Noheavy lifting, bending, twisting.     Balance Screen   Has the patient fallen in the past 6 months  Yes     How many times?  3    Has the patient had a decrease in activity level because of a fear of falling?   No    Is the patient reluctant to leave their home because of a fear of falling?   No      Home  Environment   Family/patient expects to be discharged to:  Private residence    Living Arrangements  Spouse/significant other    Available Help at Discharge  Family    Type of [place]   2   Home Layout  Two level    Bathroom Shower/Tub  [place];Door    Tour manager - 2 wheels;Walker - 4 wheels;Bedside commode;Wheelchair - manual;Hand held shower head    Lives With  Spouse;Daughter      Prior Function   Level of Independence  Needs assistance with ADLs    Vocation  On disability    Vocation Requirements  [employer], insurance    Leisure  making jewelry      ADL   Eating/Feeding  Independent   Difficulty cutting food   Grooming  Set up   Difficulaty managing hair into a ponytail, toothpste cap   Upper Body Bathing  Minimal assistance    Lower Body Bathing  Minimal assistance   Increased time to complete   Upper Body Dressing  Increased time   Assist with buttons   Lower Body Dressing  Increased time;Minimal assistance    Banker -  Hygiene  Independent   Difficulty with thorough Patent examiner  Independent      IADL   Shopping  Needs to be accompanied on any shopping trip    [place] light daily tasks but cannot maintain acceptable level of cleanliness    Meal Prep  Able to complete simple cold meal and snack prep    Community Mobility  Relies on family or friends for transportation   [place], and rollator   Medication Management  Is not capable of dispensing or managing own medication    Financial Management  Requires assistance      Written Expression    Dominant Hand  Right      Vision - History   Baseline Vision  --   Pt. reports visual issues from being on Lupus Medications.    Visual History  --   Pt. is being followed by an eye specialist   Patient Visual Report  --   Difficulty seeing computers, reading, dizziness with TV   Additional Comments  Eye discomfort with tracking.      Activity Tolerance   Activity Tolerance  Tolerates 10-20 min activity with multiple rests      Cognition   Overall Cognitive Status  Impaired/Different from baseline    Memory  Impaired    Problem Solving  Impaired      Sensation   Light Touch  Appears Intact    Stereognosis  Impaired Detail   4/5 items assessed (paperclip able to ID with visual cue)   Hot/Cold  Impaired by gross assessment    Proprioception  Appears Intact      Coordination   Right 9 Hole Peg Test  27    Left 9 Hole Peg Test  39      AROM   Overall AROM Comments  AROM: Left shoulder flexion:  120, abduction 74 Right flexion: 143, abduction: 94      Strength   Overall Strength Comments  Left: shoulder flexion 4-/5, absduction 4-/5, elbow flxion, extension 4/5, wrist extension 4-/5, Right UE strength: 4+/5      Hand Function   Right Hand Grip (lbs)  15    Right Hand Lateral Pinch  6 lbs    Right Hand 3 Point Pinch  6 lbs    Left Hand Grip (lbs)  1    Left Hand Lateral Pinch  3 lbs    Left 3 point pinch  2 lbs                           OT Long Term Goals - 09/12/18 1735      OT LONG TERM GOAL #1   Title  Pt will increase L grip strength by 6# in order to independently hike pants    Baseline  L grip strength is 1#; R grip strength is 15#; pt requires min A for hiking pants    Time  12    Period  Weeks    Status  New    Target Date  12/05/18      OT LONG TERM GOAL #2   Title  Pt will improve L lateral pinch strength by 3# in order to cut meat independently    Baseline  L pinch strength is 3#; pt is unable to cut meat    Time  12    Period   Weeks    Status  New    Target Date  12/05/18      OT LONG TERM GOAL #3   Title  Pt will increase L three point pinch by 3# in order to manage zippers independenly    Baseline  L three point pinch is 2#; pt requires increased time to manage zippers    Time  12    Period  Weeks    Status  New    Target Date  12/05/18      OT LONG TERM GOAL #4   Title  Pt will improve L hand [place] by 5 secs of speed to be able to open small containers during grooming & hygiene    Baseline  L hand  [facility] is 39 secs of speed; pt requires increased time to open small containers    Time  12    Period  Weeks    Status  New    Target Date  12/05/18      OT LONG TERM GOAL #5   Title  Pt will be able to demonstrate visual compensatory strategies 100% of the time during ADLs and IADLs    Baseline  Vision is impaired    Time  12    Period  Weeks    Status  New    Target Date  12/05/18      Long Term Additional Goals   Additional Long Term Goals  Yes      OT LONG TERM GOAL #6   Title  Pt will be independently able to demonstrate sensory compensatory strategies for safe ADLs and IADLs    Baseline  Temperature sensation is impaired; pt has sensory changes through hand to forearm    Time  12    Period  Weeks    Status  New    Target Date  12/05/18      OT LONG TERM GOAL #7   Title  Pt will be able to demonstrate lower body dressing with adaptive equipment with modified independence    Baseline  Pt requires increased time and min assistance for lower body dressing; pt has back precautions from recent surgery    Time  12    Period  Weeks    Status  New    Target Date  12/05/18      OT LONG TERM GOAL #8   Title  Pt will demonstrate knowledge of proper use of toilet aids for independent toilet hygiene    Baseline  Pt has difficulty with thorough toilet hygiene and does not currently own any toilet aids    Time  12    Period  Weeks    Status  New    Target Date  12/05/18            Plan - 09/12/18  1646    Clinical Impression Statement  Pt is a 52 y.o. female who has had a CVA with a history of multiple back surguries with the most recent being in May 2019 and a history of Lupus. Pt presents with LUE weakness and decreased [place]. Pt has impairment in sensation at the volar surface of L hand from fingertips to mid forearm. Pt expresses burning sensation in L hand that interferes with her ability to perform and engage in ADL and IADL activity. Pt also presents with difficulty performing LB dressing, toileting hygiene tasks, managing zippers and buttons, and IADL tasks. Pt to benefit from skilled OT services to increase [place], LUE strength, L grip strength, pt education about sensory compensatory strategies, and visual compensatory strategies in order to increase independence and decrease caregiver burden.    Occupational Profile and client history currently impacting functional performance  Pt is a mother and wife who is on disability.     Occupational performance deficits (Please refer to evaluation for details):  ADL's;IADL's;Leisure;Work    Rehab Potential  Good    Current Impairments/barriers affecting progress:  Positive indicators: age, motivated, family support. Negative indicators: multiple comorbidities    OT Frequency  2x / week    OT Duration  12 weeks    OT Treatment/Interventions  Self-care/ADL training;Therapeutic exercise;Visual/perceptual remediation/compensation;Neuromuscular education;Patient/family education;Therapeutic activities;Cognitive remediation/compensation;Manual Therapy;DME and/or AE instruction    Clinical Decision Making  Multiple treatment  options, significant modification of task necessary    Recommended Other Services  PT, ST    Consulted and Agree with Plan of Care  Patient       Patient will benefit from skilled therapeutic intervention in order to improve the following deficits and impairments:  Decreased cognition, Impaired vision/preception, Decreased knowledge  of use of DME, Decreased coordination, Decreased mobility, Impaired sensation, Decreased activity tolerance, Decreased strength, Decreased range of motion, Impaired UE functional use  Visit Diagnosis: Muscle weakness (generalized)  Other lack of coordination  Other disturbances of skin sensation    Problem List Patient Active Problem List   Diagnosis Date Noted  . CVA (cerebral vascular accident) ([place]) 08/19/2018  . S/P lumbar spinal fusion 05/26/2018  . High risk medication use 02/03/2018  . Wound infection after surgery 01/11/2018  . S/P lumbar laminectomy 12/09/2017  . Paresthesia 05/11/2017  . HNP (herniated nucleus pulposus), lumbar 03/29/2017  . Chronic right shoulder pain 02/18/2017  . Primary osteoarthritis of both hands 12/22/2016  . Primary osteoarthritis of both knees 12/22/2016  . Spondylosis of lumbar region without myelopathy or radiculopathy 12/22/2016  . Fibromyalgia 12/22/2016  . Preventative health care 04/19/2016  . Chronic back pain 12/17/2015  . Hyperlipidemia 08/29/2015  . Other organ or system involvement in systemic lupus erythematosus ([place]) 08/04/2015  . Rheumatoid factor positive 08/04/2015  . Sjogren's syndrome ([place]) 08/04/2015  . Mitral regurgitation   . Tobacco abuse   . Atypical chest pain   . Obesity (BMI 30-39.9)   . Anxiety   . Hypertension   . Diabetes mellitus without complication ([place])   . Clinical depression 05/23/2014   Oliver Hum, OTS  This entire session was performed under direct supervision and direction of a licensed therapist/therapist assistant . I have personally read, edited and approve of the note as written.  Harrel Carina, MS, OTR/L 09/13/2018, 9:10 AM  [place] MAIN [facility] SERVICES [address] [place], [place], [address] Phone: [phone]   Fax:  [phone]  Name: ROSY ESTABROOK MRN: [id number] Date of Birth: 05-31-1966

## 2018-09-14 ENCOUNTER — Ambulatory Visit: Payer: BLUE CROSS/BLUE SHIELD | Admitting: Occupational Therapy

## 2018-09-14 ENCOUNTER — Ambulatory Visit: Payer: BLUE CROSS/BLUE SHIELD | Admitting: Psychology

## 2018-09-19 ENCOUNTER — Ambulatory Visit: Payer: BLUE CROSS/BLUE SHIELD | Admitting: Occupational Therapy

## 2018-09-19 IMAGING — CR DG CHEST 2V
2 series · 2 of 2 positions shown · non-contrast
Comparison: Chest radiograph dated 12/06/2017

CLINICAL DATA: 51-year-old female with chest pain.

EXAM:
CHEST  2 VIEW

[chest pa]
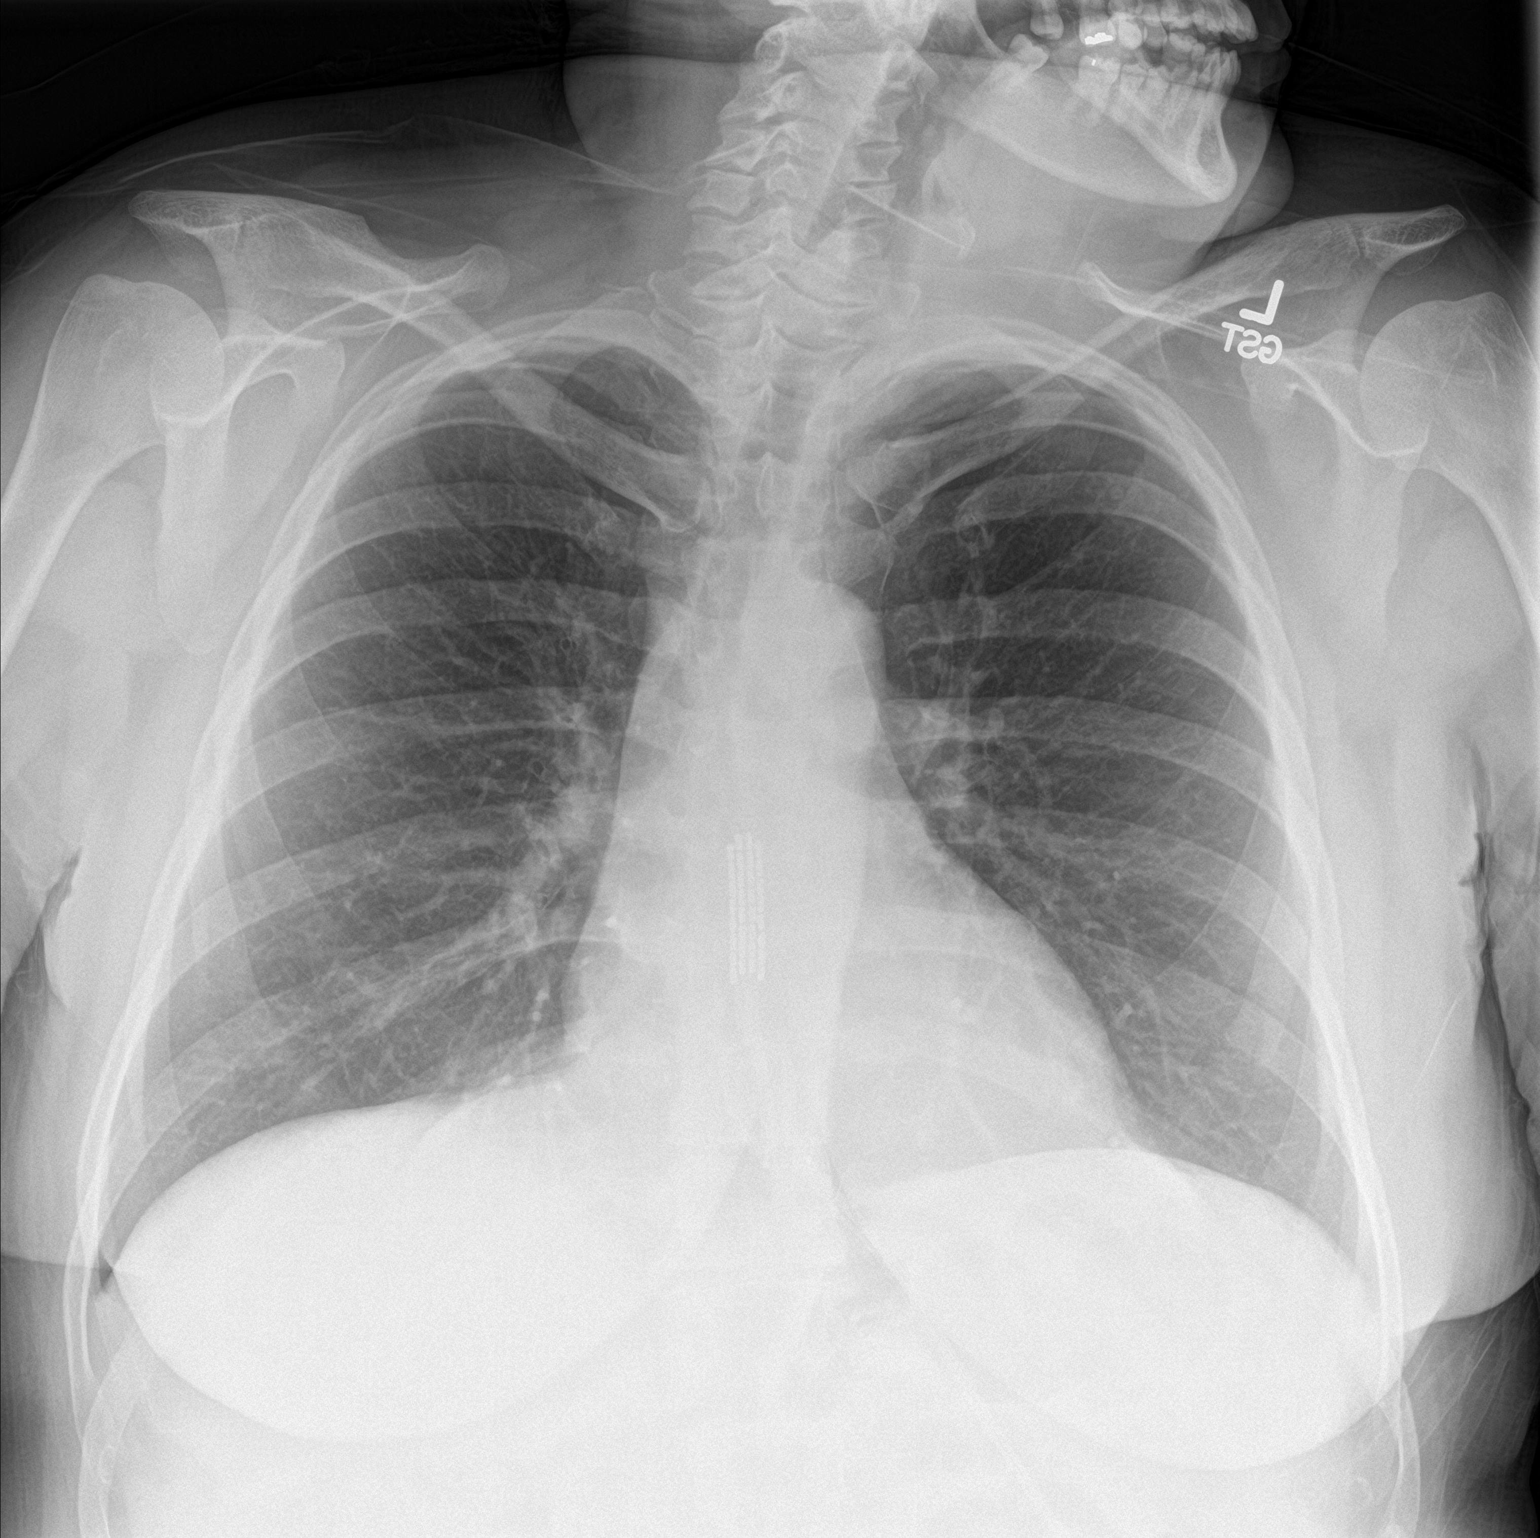

[chest lat]
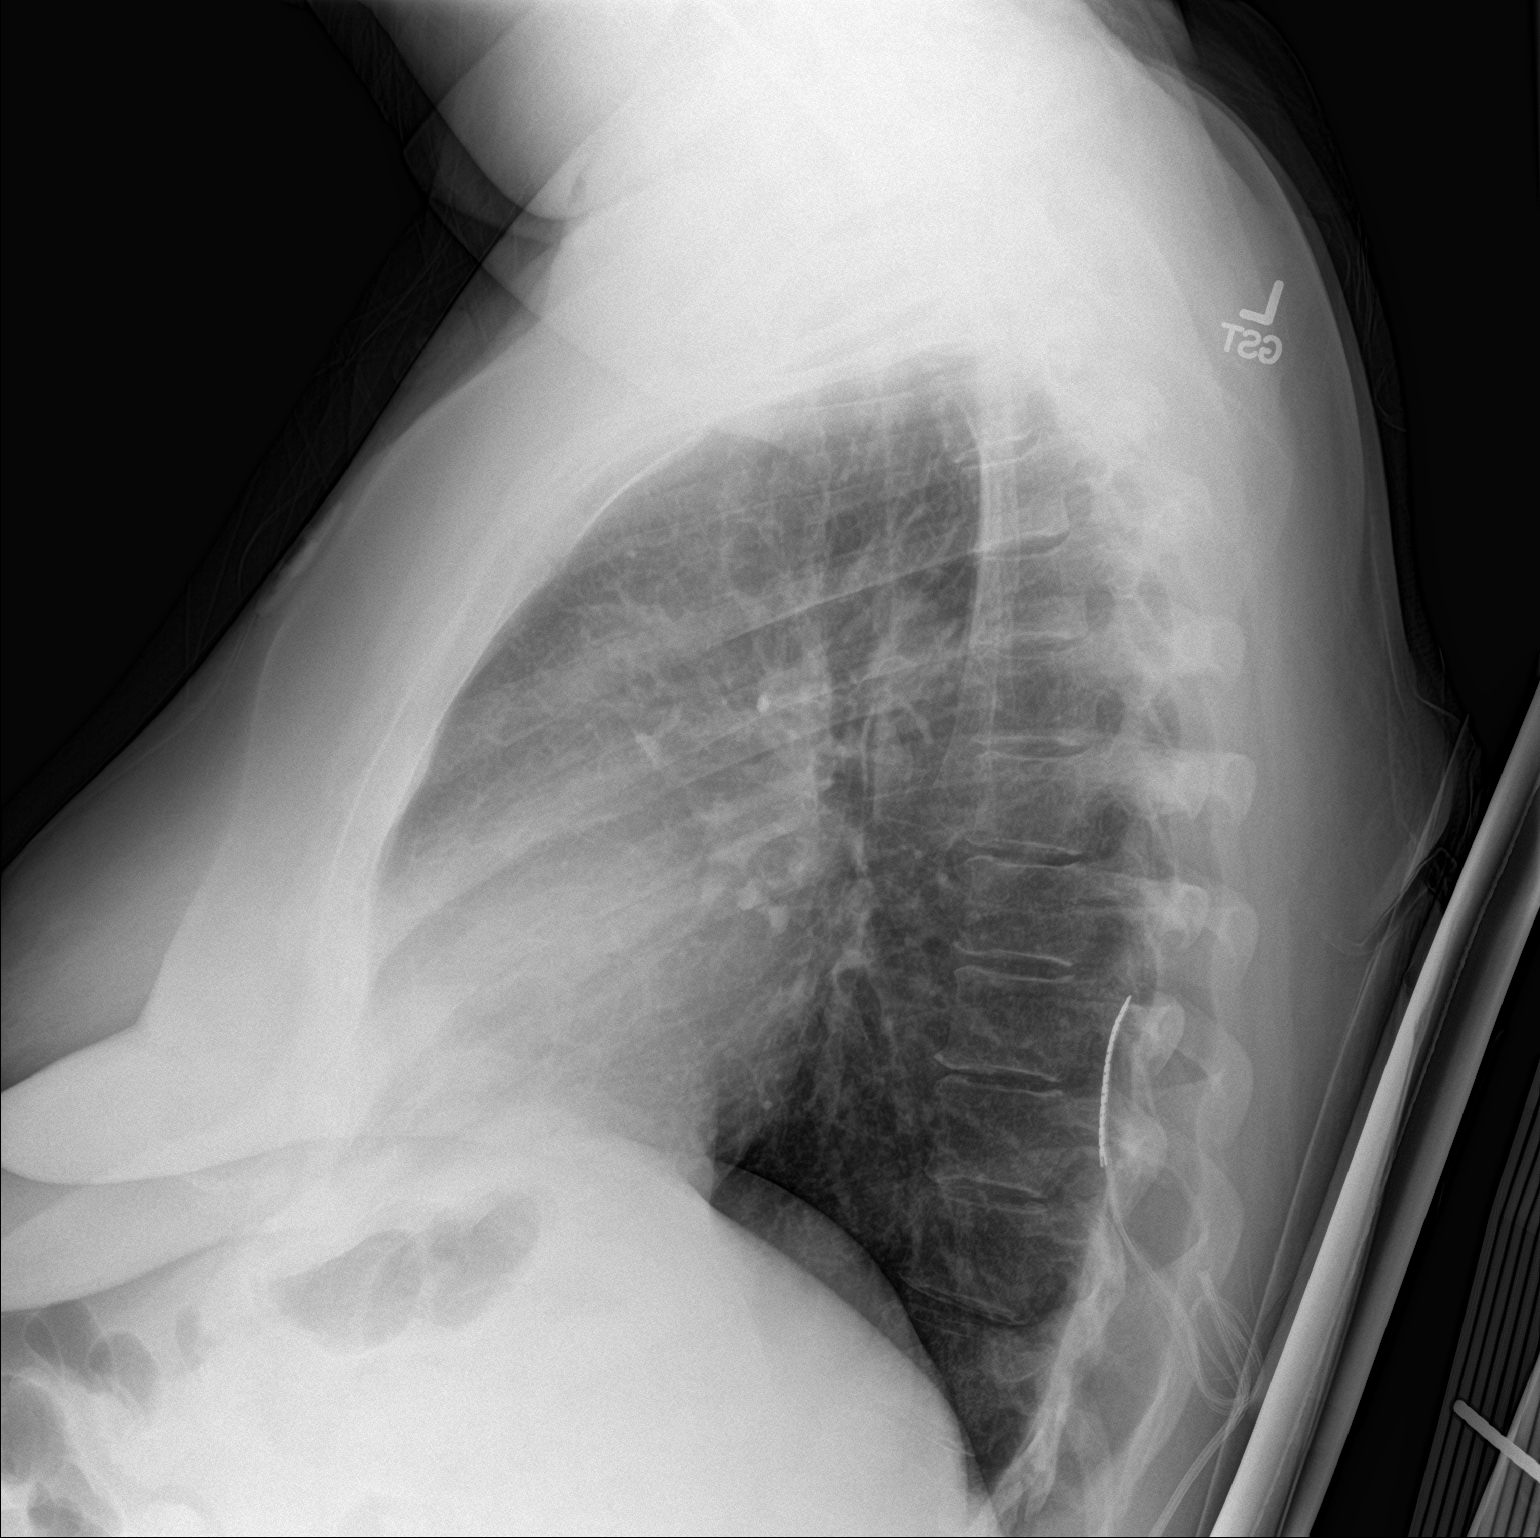

[2 of 2 positions shown; findings below may reference images not displayed]

FINDINGS: The lungs are clear. There is no pleural effusion or pneumothorax.
The cardiac silhouette is within normal limits. Spinal stimulator
noted at the level of midthoracic spine. No acute osseous pathology.
IMPRESSION: No active cardiopulmonary disease.

## 2018-09-20 DIAGNOSIS — I10 Essential (primary) hypertension: Secondary | ICD-10-CM

## 2018-09-20 DIAGNOSIS — N289 Disorder of kidney and ureter, unspecified: Secondary | ICD-10-CM

## 2018-09-21 ENCOUNTER — Other Ambulatory Visit: Payer: Self-pay | Admitting: Primary Care

## 2018-09-21 ENCOUNTER — Ambulatory Visit: Payer: BLUE CROSS/BLUE SHIELD | Admitting: Occupational Therapy

## 2018-09-21 ENCOUNTER — Ambulatory Visit: Payer: BLUE CROSS/BLUE SHIELD | Admitting: Psychology

## 2018-09-21 NOTE — Telephone Encounter (Signed)
Lauren Winters, can you cancel the nephrology referral? Sorry, thanks!

## 2018-09-26 ENCOUNTER — Encounter: Payer: Self-pay | Admitting: Physical Therapy

## 2018-09-26 ENCOUNTER — Encounter: Payer: Self-pay | Admitting: Occupational Therapy

## 2018-09-26 ENCOUNTER — Ambulatory Visit: Payer: BLUE CROSS/BLUE SHIELD | Admitting: Physical Therapy

## 2018-09-26 ENCOUNTER — Ambulatory Visit: Payer: BLUE CROSS/BLUE SHIELD | Attending: Primary Care | Admitting: Occupational Therapy

## 2018-09-26 DIAGNOSIS — M6281 Muscle weakness (generalized): Secondary | ICD-10-CM | POA: Diagnosis present

## 2018-09-26 DIAGNOSIS — R262 Difficulty in walking, not elsewhere classified: Secondary | ICD-10-CM | POA: Insufficient documentation

## 2018-09-26 DIAGNOSIS — R2681 Unsteadiness on feet: Secondary | ICD-10-CM | POA: Diagnosis present

## 2018-09-26 DIAGNOSIS — R278 Other lack of coordination: Secondary | ICD-10-CM | POA: Diagnosis present

## 2018-09-26 NOTE — Patient Instructions (Signed)
Outcome Measure: 6 min walk test: 260 feet, (community ambulator: 1000 feet, age group norm: 1800 feet) 10 meter walk test: 0.35 m/s (high fall risk) 5 times sit<>Stand: 68 sec, >15 sec indicates high fall risk

## 2018-09-26 NOTE — Therapy (Addendum)
[place] MAIN [facility] SERVICES [address] [place], [place], [address] Phone: [phone]   Fax:  [phone]  Occupational Therapy Treatment  Patient Details  Name: Lauren Winters MRN: [id number] Date of Birth: 09-17-1966 No data recorded  Encounter Date: 09/26/2018  OT End of Session - 09/26/18 1644    Visit Number  2    Number of Visits  24    Date for OT Re-Evaluation  12/06/18    Authorization Type  visist 2 of 10 for progress report period starting 09/12/2018    OT Start Time  1600    OT Stop Time  1645    OT Time Calculation (min)  45 min    Activity Tolerance  Patient limited by pain;Patient limited by fatigue    Behavior During Therapy  [facility] for tasks assessed/performed       Past Medical History:  Diagnosis Date  . Anxiety   . Atypical chest pain    a. ETT 05/2014: normal stress test without evidence of myocardial ischemia or chest pain at peak stress  . Complication of anesthesia    migraine headache  . Depression   . Diabetes mellitus without complication ([place]) metformin   dx 2015  . Dyspnea    on exertion  . Fibromyalgia   . Headache   . Heart murmur    told back in 2007, but no mention with 2016 cardiac w/u  . Hypertension   . Lupus (systemic lupus erythematosus) ([place])   . Mitral regurgitation    a. 05/2014 echo: EF >55%, nl LV/RV sys fxn, mild MR/AR, no valvular stenosis   . Obesity (BMI 30-39.9)   . Raynaud's phenomenon   . Rheumatoid arthritis ([place])   . Sjogren's disease ([place])   . Spondylolisthesis of lumbar region   . Tobacco abuse    a. ongoing; b. ongoing SOB    Past Surgical History:  Procedure Laterality Date  . ABDOMINAL HYSTERECTOMY    . BACK SURGERY    . CESAREAN SECTION     x3  . LUMBAR LAMINECTOMY    . LUMBAR LAMINECTOMY/DECOMPRESSION MICRODISCECTOMY N/A 03/29/2017   Procedure: Re operative Right Lumbar Three-Four Laminectomy and discectomy with Left Lumbar Three-Four, Bilateral Lumbar  Four-Five Laminectomy for decompression;  Surgeon: Kevan Ny Ditty, MD;  Location: [place];  Service: Neurosurgery;  Laterality: N/A;  . SPINAL CORD STIMULATOR INSERTION  12/09/2017   Procedure: LUMBAR SPINAL CORD STIMULATOR IMPLANT;  Surgeon: Eustace Moore, MD;  Location: [place];  Service: Neurosurgery;;  . SPINAL CORD STIMULATOR REMOVAL N/A 01/11/2018   Procedure: LUMBAR SPINAL CORD STIMULATOR REMOVAL;  Surgeon: Eustace Moore, MD;  Location: [place];  Service: Neurosurgery;  Laterality: N/A;  . TUBAL LIGATION      There were no vitals filed for this visit.  Subjective Assessment - 09/26/18 1641    Subjective   Pt reports having a Lupus flare over the last week and feeling a little better today.    Pertinent History  Pt is a 52 y.o. female that lives with husband and daughter. Pt was admitted to [facility] at the end of Aug presenting with a CVA, a punctate acute infarction in the lateral right Thalamus. Pt was discharged after 3 days and received home health PT that recommended outpatient therapy. Prior to CVA, pt required increased assistance for ADLs and IADLs due to limitations secondary to complications with back pain. Pt desires to increase independence for daily tasks to promote QoL and decrease  caregiver burden    Currently in Pain?  Yes    Pain Score  7     Pain Location  Hand    Pain Orientation  Left    Pain Descriptors / Indicators  Burning    Pain Type  Neuropathic pain    Pain Onset  More than a month ago    Pain Frequency  Constant    Aggravating Factors   Use of L hand, running water during hand washing, baths, and dishes    Multiple Pain Sites  Yes      OT TREATMENT  Neuromuscular re-education:  Pt placed and removed 1" circular pegs on a pegboard using L hand. Pt reported increased fatigue and pain when repetitively using hands for task. Pt experienced increased tremors as the task progressed and frequently dropped pegs towards the end. Pt reported increased headache when  having to focus and concentrate on a task. Pt. worked on active releasing of [place] style pegs. Pt. has difficulty with actively releasing objects. Pt. Education was provided about home activities for actively releasing objects.  Therapeutic exercise:  Pt placed and removed yellow resistive clips on a dowel using 3 point pinch and lateral pinch patterns with the L hand. Pt demonstrated increased fatigue while using yellow clips. Pt was unable to squeeze clips x3 before placing/removing on dowel. When fatiguing, pt's hand would become tight and would require increased time and effort to voluntarily release clips to place on tabletop.                OT Education - 09/26/18 1644    Education Details  L hand [place], L pinch strengthening    Person(s) Educated  Patient    Methods  Explanation;Demonstration;Verbal cues    Comprehension  Verbalized understanding;Returned demonstration;Verbal cues required          OT Long Term Goals - 09/12/18 1735      OT LONG TERM GOAL #1   Title  Pt will increase L grip strength by 6# in order to independently hike pants    Baseline  L grip strength is 1#; R grip strength is 15#; pt requires min A for hiking pants    Time  12    Period  Weeks    Status  New    Target Date  12/05/18      OT LONG TERM GOAL #2   Title  Pt will improve L lateral pinch strength by 3# in order to cut meat independently    Baseline  L pinch strength is 3#; pt is unable to cut meat    Time  12    Period  Weeks    Status  New    Target Date  12/05/18      OT LONG TERM GOAL #3   Title  Pt will increase L three point pinch by 3# in order to manage zippers independenly    Baseline  L three point pinch is 2#; pt requires increased time to manage zippers    Time  12    Period  Weeks    Status  New    Target Date  12/05/18      OT LONG TERM GOAL #4   Title  Pt will improve L hand [place] by 5 secs of speed to be able to open small containers during grooming &  hygiene    Baseline  L hand [place] is 39 secs of speed; pt requires increased time to open small containers  Time  12    Period  Weeks    Status  New    Target Date  12/05/18      OT LONG TERM GOAL #5   Title  Pt will be able to demonstrate visual compensatory strategies 100% of the time during ADLs and IADLs    Baseline  Vision is impaired    Time  12    Period  Weeks    Status  New    Target Date  12/05/18      Long Term Additional Goals   Additional Long Term Goals  Yes      OT LONG TERM GOAL #6   Title  Pt will be independently able to demonstrate sensory compensatory strategies for safe ADLs and IADLs    Baseline  Temperature sensation is impaired; pt has sensory changes through hand to forearm    Time  12    Period  Weeks    Status  New    Target Date  12/05/18      OT LONG TERM GOAL #7   Title  Pt will be able to demonstrate lower body dressing with adaptive equipment with modified independence    Baseline  Pt requires increased time and min assistance for lower body dressing; pt has back precautions from recent surgery    Time  12    Period  Weeks    Status  New    Target Date  12/05/18      OT LONG TERM GOAL #8   Title  Pt will demonstrate knowledge of proper use of toilet aids for independent toilet hygiene    Baseline  Pt has difficulty with thorough toilet hygiene and does not currently own any toilet aids    Time  12    Period  Weeks    Status  New    Target Date  12/05/18            Plan - 09/26/18 1645    Clinical Impression Statement  Pt continues to work on [facility] skills and pinch strength. Pt reports being able to use L hand during tasks at home such as turning pages in a magazine, squeezing the toothpaste, and grooming hair. Pt demonstrates increased fatigue with L hand use. Pt reports experiencing tension headaches when focusing or concentrating on fine motor tasks.     Occupational Profile and client history currently impacting functional  performance  Pt is a mother and wife who is on disability.     Occupational performance deficits (Please refer to evaluation for details):  ADL's;IADL's;Leisure;Work    Rehab Potential  Good    Current Impairments/barriers affecting progress:  Positive indicators: age, motivated, family support. Negative indicators: multiple comorbidities    OT Frequency  2x / week    OT Duration  12 weeks    OT Treatment/Interventions  Self-care/ADL training;Therapeutic exercise;Visual/perceptual remediation/compensation;Neuromuscular education;Patient/family education;Therapeutic activities;Cognitive remediation/compensation;Manual Therapy;DME and/or AE instruction    Clinical Decision Making  Multiple treatment options, significant modification of task necessary    Recommended Other Services  PT, ST    Consulted and Agree with Plan of Care  Patient       Patient will benefit from skilled therapeutic intervention in order to improve the following deficits and impairments:  Decreased cognition, Impaired vision/preception, Decreased knowledge of use of DME, Decreased coordination, Decreased mobility, Impaired sensation, Decreased activity tolerance, Decreased strength, Decreased range of motion, Impaired UE functional use  Visit Diagnosis: Other lack of coordination  Muscle weakness (  generalized)    Problem List Patient Active Problem List   Diagnosis Date Noted  . CVA (cerebral vascular accident) ([place]) 08/19/2018  . S/P lumbar spinal fusion 05/26/2018  . High risk medication use 02/03/2018  . Wound infection after surgery 01/11/2018  . S/P lumbar laminectomy 12/09/2017  . Paresthesia 05/11/2017  . HNP (herniated nucleus pulposus), lumbar 03/29/2017  . Chronic right shoulder pain 02/18/2017  . Primary osteoarthritis of both hands 12/22/2016  . Primary osteoarthritis of both knees 12/22/2016  . Spondylosis of lumbar region without myelopathy or radiculopathy 12/22/2016  . Fibromyalgia 12/22/2016   . Preventative health care 04/19/2016  . Chronic back pain 12/17/2015  . Hyperlipidemia 08/29/2015  . Other organ or system involvement in systemic lupus erythematosus ([place]) 08/04/2015  . Rheumatoid factor positive 08/04/2015  . Sjogren's syndrome ([place]) 08/04/2015  . Mitral regurgitation   . Tobacco abuse   . Atypical chest pain   . Obesity (BMI 30-39.9)   . Anxiety   . Hypertension   . Diabetes mellitus without complication ([place])   . Clinical depression 05/23/2014    Oliver Hum, OTS 09/26/2018, 5:48 PM   This entire session was performed under direct supervision and direction of a licensed therapist/therapist assistant . I have personally read, edited and approve of the note as written.  Harrel Carina, MS, OTR/L   [place] MAIN [facility] SERVICES [address] [place], [place], [address] Phone: [phone]   Fax:  [phone]  Name: Lauren Winters MRN: [id number] Date of Birth: February 18, 1966

## 2018-09-27 NOTE — Therapy (Signed)
[place] MAIN [facility] SERVICES [address] [place], [place], [address] Phone: [phone]   Fax:  [phone]  Physical Therapy Evaluation/Discharge Summary  Patient Details  Name: Lauren Winters MRN: [id number] Date of Birth: 21-May-1966 Referring Provider (PT): Alma Friendly, NP   Encounter Date: 09/26/2018  PT End of Session - 09/26/18 1643    Visit Number  1    Number of Visits  1    Date for PT Re-Evaluation  09/26/18    Authorization Time Period  30 visits allowed per year combined PT/OT/chiro    PT Start Time  1645    PT Stop Time  1735    PT Time Calculation (min)  50 min    Equipment Utilized During Treatment  Gait belt    Activity Tolerance  Patient limited by pain    Behavior During Therapy  [facility] for tasks assessed/performed       Past Medical History:  Diagnosis Date  . Anxiety   . Atypical chest pain    a. ETT 05/2014: normal stress test without evidence of myocardial ischemia or chest pain at peak stress  . Complication of anesthesia    migraine headache  . Depression   . Diabetes mellitus without complication ([place]) metformin   dx 2015  . Dyspnea    on exertion  . Fibromyalgia   . Headache   . Heart murmur    told back in 2007, but no mention with 2016 cardiac w/u  . Hypertension   . Lupus (systemic lupus erythematosus) ([place])   . Mitral regurgitation    a. 05/2014 echo: EF >55%, nl LV/RV sys fxn, mild MR/AR, no valvular stenosis   . Obesity (BMI 30-39.9)   . Raynaud's phenomenon   . Rheumatoid arthritis ([place])   . Sjogren's disease ([place])   . Spondylolisthesis of lumbar region   . Tobacco abuse    a. ongoing; b. ongoing SOB    Past Surgical History:  Procedure Laterality Date  . ABDOMINAL HYSTERECTOMY    . BACK SURGERY    . CESAREAN SECTION     x3  . LUMBAR LAMINECTOMY    . LUMBAR LAMINECTOMY/DECOMPRESSION MICRODISCECTOMY N/A 03/29/2017   Procedure: Re operative Right Lumbar Three-Four Laminectomy and  discectomy with Left Lumbar Three-Four, Bilateral Lumbar Four-Five Laminectomy for decompression;  Surgeon: Kevan Ny Ditty, MD;  Location: [place];  Service: Neurosurgery;  Laterality: N/A;  . SPINAL CORD STIMULATOR INSERTION  12/09/2017   Procedure: LUMBAR SPINAL CORD STIMULATOR IMPLANT;  Surgeon: Eustace Moore, MD;  Location: [place];  Service: Neurosurgery;;  . SPINAL CORD STIMULATOR REMOVAL N/A 01/11/2018   Procedure: LUMBAR SPINAL CORD STIMULATOR REMOVAL;  Surgeon: Eustace Moore, MD;  Location: [place];  Service: Neurosurgery;  Laterality: N/A;  . TUBAL LIGATION      There were no vitals filed for this visit.   Subjective Assessment - 09/26/18 1642    Subjective  Patient reports recent Lupus flare up;     Pertinent History  52 yo Female reports CVA end of August 2019. MRI shows Punctate acute infarction in the lateral thalamus on the right. She spent 3 days in Acute care and was discharged home with home health PT. She is now being referred for outpatient PT for residual deficits. Patient reports increased left UE weakness with numbness in hand and intermittent burning. She has been evaluated and will be treated by Occupational therapy for this issue. She also reports impaired ambulation, requiring SPC for  household ambulation; No recent falls reported; PMH significant for lupus, diabetes, HTN and HLD; She is being referred to endocrinologist to manage sugar levels; In addition, she is being referred to nephrologist to manage kidney function. Patient also has a history of back pain with multiple back surgeries; She reports prior to stroke she had tingling in feet and some impaired balance; She has used the [facility] for 3 years and uses rollator at times when having bad days; Pt reports 3 falls in last year, all since her stroke; Reports unsteadiness when turning or changing positions; Denies any dizziness; She reports that her falls occur when "my feet don't do what they are supposed to be doing."      Limitations  Standing    How long can you sit comfortably?  20 min in recliner    How long can you stand comfortably?  <10 min    How long can you walk comfortably?  >500 feet, uses SPC    Diagnostic tests  MRI shows Punctate acute infarction in the lateral thalamus on the right    Patient Stated Goals  "improve strength in arm" Patient reports "I don't know if anything else can be fixed."     Currently in Pain?  Yes    Pain Score  7     Pain Location  Back    Pain Orientation  Mid;Lower    Pain Descriptors / Indicators  Throbbing    Pain Type  Chronic pain    Pain Onset  More than a month ago    Pain Frequency  Constant    Aggravating Factors   worse with sitting up straight, standing long time (>10 min)    Pain Relieving Factors  sitting in recliner, pain meds- taking gabapentin/flexeril;     Effect of Pain on Daily Activities  follows up with neurosurgeon; decreased activity tolerance;     Multiple Pain Sites  Yes    Pain Score  5    Pain Location  Head    Pain Orientation  Anterior    Pain Descriptors / Indicators  Aching;Sore    Pain Type  Acute pain    Pain Onset  1 to 4 weeks ago    Pain Frequency  Constant    Aggravating Factors   focus on something/reading    Pain Relieving Factors  closing eyes    Effect of Pain on Daily Activities  decreased tolerance with looking at screens, decreased attention/focus;          [facility] PT Assessment - 09/27/18 0001      Assessment   Medical Diagnosis  CVA    Referring Provider (PT)  Alma Friendly, NP    Onset Date/Surgical Date  08/19/18    Hand Dominance  Right    Next MD Visit  recently saw Neurologist on 9/25 [place])    Prior Therapy  had Acute care PT, home health PT for this condition; denies any other PT prior to CVA onset;       Precautions   Precautions  Fall      Restrictions   Weight Bearing Restrictions  No      Balance Screen   Has the patient fallen in the past 6 months  Yes    How many times?  3    Has the  patient had a decrease in activity level because of a fear of falling?   No    Is the patient reluctant to leave their home because of  a fear of falling?   No      Home Environment   Additional Comments  Lives in single story home, 2 steps to enter with rail on right side; negotiates steps one step at a time, PLOF; bathroom accessible with walk-in shower;       Prior Function   Level of Independence  Needs assistance with ADLs    Vocation  On disability    Vocation Requirements  [employer], insurance    Leisure  Social worker;       Cognition   Memory  Impaired      Sensation   Light Touch  Appears Intact    Additional Comments  denies any numbness/tingling in legs      Coordination   Gross Motor Movements are Fluid and Coordinated  Yes    Fine Motor Movements are Fluid and Coordinated  Not tested    Heel Shin Test  unable due to back pain      Posture/Postural Control   Posture Comments  sits with forward flexed posture; reports increased back pain with erect posture;       AROM   Overall AROM Comments  BLE are [facility]      Strength   Overall Strength Comments  BLE grossly 3+/5 , weakness is symmetrical and likely related to back pain       Transfers   Comments  able to transfer mod I, requiring increased time and effort using 1 rail to push up on chair;       Ambulation/Gait   Gait Comments  ambulates with SPC, decreased step length, decreased weight shift, slight antalgic gait on right, with decreased stance time, decreased foot clearance, slower gait speed, mod I requiring increased time;       6 Minute Walk- Baseline   6 Minute Walk- Baseline  yes    BP (mmHg)  131/77    HR (bpm)  91    02 Sat (%RA)  97 %      6 Minute walk- Post Test   6 Minute Walk Post Test  yes    BP (mmHg)  152/78    HR (bpm)  104    02 Sat (%RA)  100 %    Modified Borg Scale for Dyspnea  4- somewhat severe      6 minute walk test results    Aerobic Endurance Distance Walked   260    Endurance additional comments  with SPC; required 1 sitting rest break;       Standardized Balance Assessment   Five times sit to stand comments   68 sec with 1 HHA pushing up on chair, limited due to increased back pain;    >10 sec indicates high risk for falls   10 Meter Walk  0.36 m/s with SPC, indicates high risk for falls, likely homebound      High Level Balance   High Level Balance Comments  exhibits fair static standing balance and fair(-) dynamic standing balance; most balance limited by severe back pain;                 Objective measurements completed on examination: See above findings.              PT Education - 09/26/18 1711    Education Details  POC/recommendations    Person(s) Educated  Patient    Methods  Explanation    Comprehension  Verbalized understanding  PT Long Term Goals - 09/27/18 0905      PT LONG TERM GOAL #1   Title  Patient will exhibit better safety awareness with gait using SPC when ambulating to reduce fall risk;     Time  1    Period  Days    Status  Achieved    Target Date  09/26/18             Plan - 09/27/18 8786    Clinical Impression Statement  52 yo Female reports increased left UE pain/weakness as a results of right thalamic stroke. Patient has a PMH significant with severe chronic low back pain with multiple spinal surgeries. Her most recent surgery was in early 2019. She is supposed to follow up with neurosurgeon in 3 weeks. Patient's gait/balance limited by severe back pain. Patient does have weakness in BLE which is symmetrical and likely due to prior back pain/decreased mobility PLOF. She does exhibit need for skilled PT intervention. However, patient would not be able to tolerate prolonged standing/walking to work on balance and gait safety; Recommended aquatic therapy to work on balance in an environment that would be less painful. However, patient does not have any assistance that can come  with her to help her get changed. recommended patient follow up with neurosurgeon to determine plan regarding severe back pain. Informed patient if her situation changes and she has help for the pool, would re-assess to see if pool would still be appropriate. Patient agreeable at this time.     History and Personal Factors relevant to plan of care:  lives with husband, has steps to enter single story home, 3 falls in last few weeks, severe chronic back pain which limits standing/walking tolerance; limited caregiver support    Clinical Presentation  Unstable    Clinical Presentation due to:  severe pain limiting mobility with recent fall history leads to unstable clinical presentation;     Clinical Decision Making  High    Rehab Potential  Fair    Clinical Impairments Affecting Rehab Potential  severe chronic back pain, limited caregiver availability to help patient at the pool; impaired balance with recent falls;     PT Frequency  One time visit    PT Treatment/Interventions  Patient/family education    Recommended Other Services  recommended aquatic therapy, however patient unable to have caregiver help her with getting dressed after pool; recommended patient contact PT if she is able to find caregiver to assist with the pool.     Consulted and Agree with Plan of Care  Patient       Patient will benefit from skilled therapeutic intervention in order to improve the following deficits and impairments:  Abnormal gait, Decreased endurance, Hypomobility, Obesity, Pain, Decreased strength, Decreased activity tolerance, Decreased balance, Decreased mobility, Difficulty walking, Decreased range of motion, Improper body mechanics, Postural dysfunction  Visit Diagnosis: Muscle weakness (generalized)  Unsteadiness on feet  Difficulty in walking, not elsewhere classified     Problem List Patient Active Problem List   Diagnosis Date Noted  . CVA (cerebral vascular accident) ([place]) 08/19/2018  . S/P  lumbar spinal fusion 05/26/2018  . High risk medication use 02/03/2018  . Wound infection after surgery 01/11/2018  . S/P lumbar laminectomy 12/09/2017  . Paresthesia 05/11/2017  . HNP (herniated nucleus pulposus), lumbar 03/29/2017  . Chronic right shoulder pain 02/18/2017  . Primary osteoarthritis of both hands 12/22/2016  . Primary osteoarthritis of both knees 12/22/2016  . Spondylosis of lumbar region  without myelopathy or radiculopathy 12/22/2016  . Fibromyalgia 12/22/2016  . Preventative health care 04/19/2016  . Chronic back pain 12/17/2015  . Hyperlipidemia 08/29/2015  . Other organ or system involvement in systemic lupus erythematosus ([place]) 08/04/2015  . Rheumatoid factor positive 08/04/2015  . Sjogren's syndrome ([place]) 08/04/2015  . Mitral regurgitation   . Tobacco abuse   . Atypical chest pain   . Obesity (BMI 30-39.9)   . Anxiety   . Hypertension   . Diabetes mellitus without complication ([place])   . Clinical depression 05/23/2014    Trotter,Margaret PT, DPT 09/27/2018, 9:06 AM  [place] MAIN [facility] SERVICES [address] [place], [place], [address] Phone: [phone]   Fax:  [phone]  Name: Lauren Winters MRN: [id number] Date of Birth: Apr 16, 1966

## 2018-09-28 ENCOUNTER — Ambulatory Visit: Payer: BLUE CROSS/BLUE SHIELD | Admitting: Physical Therapy

## 2018-09-28 ENCOUNTER — Ambulatory Visit: Payer: BLUE CROSS/BLUE SHIELD | Admitting: Psychology

## 2018-09-28 ENCOUNTER — Ambulatory Visit: Payer: BLUE CROSS/BLUE SHIELD | Admitting: Occupational Therapy

## 2018-10-03 ENCOUNTER — Ambulatory Visit: Payer: BLUE CROSS/BLUE SHIELD | Admitting: Occupational Therapy

## 2018-10-03 ENCOUNTER — Encounter: Payer: Self-pay | Admitting: Occupational Therapy

## 2018-10-03 ENCOUNTER — Ambulatory Visit: Payer: Self-pay | Admitting: Physical Therapy

## 2018-10-03 DIAGNOSIS — R278 Other lack of coordination: Secondary | ICD-10-CM | POA: Diagnosis not present

## 2018-10-03 DIAGNOSIS — M6281 Muscle weakness (generalized): Secondary | ICD-10-CM

## 2018-10-03 NOTE — Therapy (Signed)
[place] MAIN [facility] SERVICES [address] [place], [place], [address] Phone: [phone]   Fax:  [phone]  Occupational Therapy Treatment  Patient Details  Name: Lauren Winters MRN: [id number] Date of Birth: 08-26-1966 No data recorded  Encounter Date: 10/03/2018  OT End of Session - 10/03/18 1734    Visit Number  3    Number of Visits  24    Date for OT Re-Evaluation  12/06/18    Authorization Type  visit 3 of 10 for progress report period starting 09/12/2018    OT Start Time  1545    OT Stop Time  1630    OT Time Calculation (min)  45 min    Activity Tolerance  Patient limited by pain;Patient limited by fatigue    Behavior During Therapy  [facility] for tasks assessed/performed       Past Medical History:  Diagnosis Date  . Anxiety   . Atypical chest pain    a. ETT 05/2014: normal stress test without evidence of myocardial ischemia or chest pain at peak stress  . Complication of anesthesia    migraine headache  . Depression   . Diabetes mellitus without complication ([place]) metformin   dx 2015  . Dyspnea    on exertion  . Fibromyalgia   . Headache   . Heart murmur    told back in 2007, but no mention with 2016 cardiac w/u  . Hypertension   . Lupus (systemic lupus erythematosus) ([place])   . Mitral regurgitation    a. 05/2014 echo: EF >55%, nl LV/RV sys fxn, mild MR/AR, no valvular stenosis   . Obesity (BMI 30-39.9)   . Raynaud's phenomenon   . Rheumatoid arthritis ([place])   . Sjogren's disease ([place])   . Spondylolisthesis of lumbar region   . Tobacco abuse    a. ongoing; b. ongoing SOB    Past Surgical History:  Procedure Laterality Date  . ABDOMINAL HYSTERECTOMY    . BACK SURGERY    . CESAREAN SECTION     x3  . LUMBAR LAMINECTOMY    . LUMBAR LAMINECTOMY/DECOMPRESSION MICRODISCECTOMY N/A 03/29/2017   Procedure: Re operative Right Lumbar Three-Four Laminectomy and discectomy with Left Lumbar Three-Four, Bilateral Lumbar  Four-Five Laminectomy for decompression;  Surgeon: Kevan Ny Ditty, MD;  Location: [place];  Service: Neurosurgery;  Laterality: N/A;  . SPINAL CORD STIMULATOR INSERTION  12/09/2017   Procedure: LUMBAR SPINAL CORD STIMULATOR IMPLANT;  Surgeon: Eustace Moore, MD;  Location: [place];  Service: Neurosurgery;;  . SPINAL CORD STIMULATOR REMOVAL N/A 01/11/2018   Procedure: LUMBAR SPINAL CORD STIMULATOR REMOVAL;  Surgeon: Eustace Moore, MD;  Location: [place];  Service: Neurosurgery;  Laterality: N/A;  . TUBAL LIGATION      There were no vitals filed for this visit.  Subjective Assessment - 10/03/18 1733    Subjective   Pt. reports that she still has mouth sores from her Lupus Flare.    Pertinent History  Pt is a 52 y.o. female that lives with husband and daughter. Pt was admitted to [facility] at the end of Aug presenting with a CVA, a punctate acute infarction in the lateral right [place]. Pt was discharged after 3 days and received home health PT that recommended outpatient therapy. Prior to CVA, pt required increased assistance for ADLs and IADLs due to limitations secondary to complications with back pain. Pt desires to increase independence for daily tasks to promote QoL and decrease caregiver burden  Currently in Pain?  Yes      OT TREATMENT    Therapeutic Exercise:  Pt. Performed hand strengthening with yellow theraputty. Pt. required cues for proper technique. Pt. worked on gross grip, lateral pinch, 3pt. pinch, gross digit extension, thumb opposition, and lumbrical ex. Pt. required verbal and tactile cues for proper technique. Pt. Education was provided about a HEP with yellow theraputty. A visual handout was provided. Pt. Performed AROM for forearm supination, and wrist extension. Pt. was able to perform wrist extension 2 reps with 1# dumbell weight.                          OT Education - 10/03/18 1734    Education Details  left hand [place] skills, Left pinch  strengthening    Person(s) Educated  Patient    Methods  Explanation;Demonstration;Verbal cues    Comprehension  Verbalized understanding;Returned demonstration;Verbal cues required          OT Long Term Goals - 09/12/18 1735      OT LONG TERM GOAL #1   Title  Pt will increase L grip strength by 6# in order to independently hike pants    Baseline  L grip strength is 1#; R grip strength is 15#; pt requires min A for hiking pants    Time  12    Period  Weeks    Status  New    Target Date  12/05/18      OT LONG TERM GOAL #2   Title  Pt will improve L lateral pinch strength by 3# in order to cut meat independently    Baseline  L pinch strength is 3#; pt is unable to cut meat    Time  12    Period  Weeks    Status  New    Target Date  12/05/18      OT LONG TERM GOAL #3   Title  Pt will increase L three point pinch by 3# in order to manage zippers independenly    Baseline  L three point pinch is 2#; pt requires increased time to manage zippers    Time  12    Period  Weeks    Status  New    Target Date  12/05/18      OT LONG TERM GOAL #4   Title  Pt will improve L hand [place] by 5 secs of speed to be able to open small containers during grooming & hygiene    Baseline  L hand [place] is 39 secs of speed; pt requires increased time to open small containers    Time  12    Period  Weeks    Status  New    Target Date  12/05/18      OT LONG TERM GOAL #5   Title  Pt will be able to demonstrate visual compensatory strategies 100% of the time during ADLs and IADLs    Baseline  Vision is impaired    Time  12    Period  Weeks    Status  New    Target Date  12/05/18      Long Term Additional Goals   Additional Long Term Goals  Yes      OT LONG TERM GOAL #6   Title  Pt will be independently able to demonstrate sensory compensatory strategies for safe ADLs and IADLs    Baseline  Temperature sensation is impaired; pt has sensory changes  through hand to forearm    Time  12    Period   Weeks    Status  New    Target Date  12/05/18      OT LONG TERM GOAL #7   Title  Pt will be able to demonstrate lower body dressing with adaptive equipment with modified independence    Baseline  Pt requires increased time and min assistance for lower body dressing; pt has back precautions from recent surgery    Time  12    Period  Weeks    Status  New    Target Date  12/05/18      OT LONG TERM GOAL #8   Title  Pt will demonstrate knowledge of proper use of toilet aids for independent toilet hygiene    Baseline  Pt has difficulty with thorough toilet hygiene and does not currently own any toilet aids    Time  12    Period  Weeks    Status  New    Target Date  12/05/18            Plan - 10/03/18 1736    Clinical Impression Statement Pt. reports that she plans to have a nerve conduction test next week to rule out CTS in her LUE. Pt. reports concerns that the pain in her left hand started when she had the stroke. Pt. continues to present with weakness, pain, and limited [facility] skills in her left hand. Pt. continues to work on improving UE functioning in preparation for improved engagemnet in ADL, and IADL tasks.     Occupational Profile and client history currently impacting functional performance  Pt is a mother and wife who is on disability.     Occupational performance deficits (Please refer to evaluation for details):  ADL's;IADL's;Leisure;Work    Rehab Potential  Good    Current Impairments/barriers affecting progress:  Positive indicators: age, motivated, family support. Negative indicators: multiple comorbidities    OT Frequency  2x / week    OT Duration  12 weeks    OT Treatment/Interventions  Self-care/ADL training;Therapeutic exercise;Visual/perceptual remediation/compensation;Neuromuscular education;Patient/family education;Therapeutic activities;Cognitive remediation/compensation;Manual Therapy;DME and/or AE instruction    Clinical Decision Making  Multiple treatment  options, significant modification of task necessary    Consulted and Agree with Plan of Care  Patient       Patient will benefit from skilled therapeutic intervention in order to improve the following deficits and impairments:  Decreased cognition, Impaired vision/preception, Decreased knowledge of use of DME, Decreased coordination, Decreased mobility, Impaired sensation, Decreased activity tolerance, Decreased strength, Decreased range of motion, Impaired UE functional use  Visit Diagnosis: Muscle weakness (generalized)  Other lack of coordination    Problem List Patient Active Problem List   Diagnosis Date Noted  . CVA (cerebral vascular accident) ([place]) 08/19/2018  . S/P lumbar spinal fusion 05/26/2018  . High risk medication use 02/03/2018  . Wound infection after surgery 01/11/2018  . S/P lumbar laminectomy 12/09/2017  . Paresthesia 05/11/2017  . HNP (herniated nucleus pulposus), lumbar 03/29/2017  . Chronic right shoulder pain 02/18/2017  . Primary osteoarthritis of both hands 12/22/2016  . Primary osteoarthritis of both knees 12/22/2016  . Spondylosis of lumbar region without myelopathy or radiculopathy 12/22/2016  . Fibromyalgia 12/22/2016  . Preventative health care 04/19/2016  . Chronic back pain 12/17/2015  . Hyperlipidemia 08/29/2015  . Other organ or system involvement in systemic lupus erythematosus ([place]) 08/04/2015  . Rheumatoid factor positive 08/04/2015  . Sjogren's syndrome ([place]) 08/04/2015  .  Mitral regurgitation   . Tobacco abuse   . Atypical chest pain   . Obesity (BMI 30-39.9)   . Anxiety   . Hypertension   . Diabetes mellitus without complication ([place])   . Clinical depression 05/23/2014    Harrel Carina, MS, OTR/L 10/03/2018, 5:43 PM  [place] MAIN [facility] SERVICES [address] [place], [place], [address] Phone: [phone]   Fax:  [phone]  Name: Lauren Winters MRN: [id number] Date of Birth:  [birth year]

## 2018-10-04 ENCOUNTER — Ambulatory Visit: Payer: Self-pay | Admitting: Primary Care

## 2018-10-04 ENCOUNTER — Ambulatory Visit: Payer: BLUE CROSS/BLUE SHIELD | Admitting: Primary Care

## 2018-10-05 ENCOUNTER — Ambulatory Visit: Payer: Self-pay | Admitting: Physical Therapy

## 2018-10-05 ENCOUNTER — Ambulatory Visit: Payer: BLUE CROSS/BLUE SHIELD | Admitting: Psychology

## 2018-10-05 ENCOUNTER — Ambulatory Visit: Payer: BLUE CROSS/BLUE SHIELD | Admitting: Occupational Therapy

## 2018-10-06 DIAGNOSIS — I10 Essential (primary) hypertension: Secondary | ICD-10-CM | POA: Insufficient documentation

## 2018-10-06 DIAGNOSIS — N183 Chronic kidney disease, stage 3 unspecified: Secondary | ICD-10-CM | POA: Insufficient documentation

## 2018-10-06 DIAGNOSIS — E1129 Type 2 diabetes mellitus with other diabetic kidney complication: Secondary | ICD-10-CM | POA: Insufficient documentation

## 2018-10-09 ENCOUNTER — Ambulatory Visit: Payer: BLUE CROSS/BLUE SHIELD | Admitting: Primary Care

## 2018-10-10 ENCOUNTER — Ambulatory Visit: Payer: Self-pay | Admitting: Physical Therapy

## 2018-10-10 ENCOUNTER — Ambulatory Visit: Payer: BLUE CROSS/BLUE SHIELD | Admitting: Occupational Therapy

## 2018-10-10 ENCOUNTER — Encounter: Payer: Self-pay | Admitting: Occupational Therapy

## 2018-10-10 DIAGNOSIS — R278 Other lack of coordination: Secondary | ICD-10-CM

## 2018-10-10 DIAGNOSIS — M6281 Muscle weakness (generalized): Secondary | ICD-10-CM

## 2018-10-10 NOTE — Therapy (Signed)
[place] MAIN [facility] SERVICES [address] [place], [place], [address] Phone: [phone]   Fax:  [phone]  Occupational Therapy Treatment  Patient Details  Name: Lauren Winters MRN: [id number] Date of Birth: 13-Dec-1966 No data recorded  Encounter Date: 10/10/2018  OT End of Session - 10/10/18 1709    Visit Number  4    Number of Visits  24    Date for OT Re-Evaluation  12/06/18    Authorization Type  visit 4 of 10 for progress report period starting 09/12/2018    OT Start Time  1545    OT Stop Time  1630    OT Time Calculation (min)  45 min    Activity Tolerance  Patient limited by pain;Patient limited by fatigue    Behavior During Therapy  [facility] for tasks assessed/performed       Past Medical History:  Diagnosis Date  . Anxiety   . Atypical chest pain    a. ETT 05/2014: normal stress test without evidence of myocardial ischemia or chest pain at peak stress  . Complication of anesthesia    migraine headache  . Depression   . Diabetes mellitus without complication ([place]) metformin   dx 2015  . Dyspnea    on exertion  . Fibromyalgia   . Headache   . Heart murmur    told back in 2007, but no mention with 2016 cardiac w/u  . Hypertension   . Lupus (systemic lupus erythematosus) ([place])   . Mitral regurgitation    a. 05/2014 echo: EF >55%, nl LV/RV sys fxn, mild MR/AR, no valvular stenosis   . Obesity (BMI 30-39.9)   . Raynaud's phenomenon   . Rheumatoid arthritis ([place])   . Sjogren's disease ([place])   . Spondylolisthesis of lumbar region   . Tobacco abuse    a. ongoing; b. ongoing SOB    Past Surgical History:  Procedure Laterality Date  . ABDOMINAL HYSTERECTOMY    . BACK SURGERY    . CESAREAN SECTION     x3  . LUMBAR LAMINECTOMY    . LUMBAR LAMINECTOMY/DECOMPRESSION MICRODISCECTOMY N/A 03/29/2017   Procedure: Re operative Right Lumbar Three-Four Laminectomy and discectomy with Left Lumbar Three-Four, Bilateral Lumbar  Four-Five Laminectomy for decompression;  Surgeon: Kevan Ny Ditty, MD;  Location: [place];  Service: Neurosurgery;  Laterality: N/A;  . SPINAL CORD STIMULATOR INSERTION  12/09/2017   Procedure: LUMBAR SPINAL CORD STIMULATOR IMPLANT;  Surgeon: Eustace Moore, MD;  Location: [place];  Service: Neurosurgery;;  . SPINAL CORD STIMULATOR REMOVAL N/A 01/11/2018   Procedure: LUMBAR SPINAL CORD STIMULATOR REMOVAL;  Surgeon: Eustace Moore, MD;  Location: [place];  Service: Neurosurgery;  Laterality: N/A;  . TUBAL LIGATION      There were no vitals filed for this visit.  Subjective Assessment - 10/10/18 1704    Subjective   Pt. reports that she found out she has some kidney issues.    Pertinent History  Pt is a 52 y.o. female that lives with husband and daughter. Pt was admitted to [facility] at the end of Aug presenting with a CVA, a punctate acute infarction in the lateral right Th[place]amus. Pt was discharged after 3 days and received home health PT that recommended outpatient therapy. Prior to CVA, pt required increased assistance for ADLs and IADLs due to limitations secondary to complications with back pain. Pt desires to increase independence for daily tasks to promote QoL and decrease caregiver burden  Currently in Pain?  Yes    Pain Score  5     Pain Location  Hand    Pain Orientation  Left    Pain Descriptors / Indicators  Burning    Pain Onset  More than a month ago    Pain Frequency  Constant      OT TREATMENT    Neuro muscular re-education:  Pt. worked on grasping 2" pegs, and placing them in a resistive pegboard with her left hand. Pt. worked on removing the pegs using yellow resistive clips. Pt. Required multiple rest breaks to complete the task. Pt. required cues for the pegboard position. Pt. worked on grasping extra small earring backings, and placing them on a small dowel to simulate donning earrings with bilateral hands.                           OT Education -  10/10/18 1708    Education Details  left hand [place] skills, Left pinch strengthening    Person(s) Educated  Patient    Methods  Explanation;Demonstration;Verbal cues    Comprehension  Verbalized understanding;Returned demonstration;Verbal cues required          OT Long Term Goals - 09/12/18 1735      OT LONG TERM GOAL #1   Title  Pt will increase L grip strength by 6# in order to independently hike pants    Baseline  L grip strength is 1#; R grip strength is 15#; pt requires min A for hiking pants    Time  12    Period  Weeks    Status  New    Target Date  12/05/18      OT LONG TERM GOAL #2   Title  Pt will improve L lateral pinch strength by 3# in order to cut meat independently    Baseline  L pinch strength is 3#; pt is unable to cut meat    Time  12    Period  Weeks    Status  New    Target Date  12/05/18      OT LONG TERM GOAL #3   Title  Pt will increase L three point pinch by 3# in order to manage zippers independenly    Baseline  L three point pinch is 2#; pt requires increased time to manage zippers    Time  12    Period  Weeks    Status  New    Target Date  12/05/18      OT LONG TERM GOAL #4   Title  Pt will improve L hand [place] by 5 secs of speed to be able to open small containers during grooming & hygiene    Baseline  L hand [place] is 39 secs of speed; pt requires increased time to open small containers    Time  12    Period  Weeks    Status  New    Target Date  12/05/18      OT LONG TERM GOAL #5   Title  Pt will be able to demonstrate visual compensatory strategies 100% of the time during ADLs and IADLs    Baseline  Vision is impaired    Time  12    Period  Weeks    Status  New    Target Date  12/05/18      Long Term Additional Goals   Additional Long Term Goals  Yes  OT LONG TERM GOAL #6   Title  Pt will be independently able to demonstrate sensory compensatory strategies for safe ADLs and IADLs    Baseline  Temperature sensation is impaired; pt  has sensory changes through hand to forearm    Time  12    Period  Weeks    Status  New    Target Date  12/05/18      OT LONG TERM GOAL #7   Title  Pt will be able to demonstrate lower body dressing with adaptive equipment with modified independence    Baseline  Pt requires increased time and min assistance for lower body dressing; pt has back precautions from recent surgery    Time  12    Period  Weeks    Status  New    Target Date  12/05/18      OT LONG TERM GOAL #8   Title  Pt will demonstrate knowledge of proper use of toilet aids for independent toilet hygiene    Baseline  Pt has difficulty with thorough toilet hygiene and does not currently own any toilet aids    Time  12    Period  Weeks    Status  New    Target Date  12/05/18            Plan - 10/10/18 1710    Clinical Impression Statement Pt. reports that she needs to be able to come 1x a week from now on to be able to accommmodate medical appointments as she reports finding out that she has kidney disease. Pt. has an appointment for a nerve conducation test tomorrow. Pt. reportes that she has a cut on her left hand, and that the bandaid she is wearing hurts more than the cut,. pt. reports that her hand feels burns/stings like blisters. Pt. continues to work on improivng left hand strength, and [facility] skills. Pt. continues to work on improving overall strength, and [facility] skills in oreder to improve engagement in ADL, and IADL tasks.     Occupational Profile and client history currently impacting functional performance  Pt is a mother and wife who is on disability.     Occupational performance deficits (Please refer to evaluation for details):  ADL's;IADL's;Leisure;Work    Rehab Potential  Good    Current Impairments/barriers affecting progress:  Positive indicators: age, motivated, family support. Negative indicators: multiple comorbidities    OT Frequency  2x / week    OT Duration  12 weeks    OT Treatment/Interventions   Self-care/ADL training;Therapeutic exercise;Visual/perceptual remediation/compensation;Neuromuscular education;Patient/family education;Therapeutic activities;Cognitive remediation/compensation;Manual Therapy;DME and/or AE instruction    Clinical Decision Making  Multiple treatment options, significant modification of task necessary    Recommended Other Services  PT, ST    Consulted and Agree with Plan of Care  Patient       Patient will benefit from skilled therapeutic intervention in order to improve the following deficits and impairments:  Decreased cognition, Impaired vision/preception, Decreased knowledge of use of DME, Decreased coordination, Decreased mobility, Impaired sensation, Decreased activity tolerance, Decreased strength, Decreased range of motion, Impaired UE functional use  Visit Diagnosis: Muscle weakness (generalized)  Other lack of coordination    Problem List Patient Active Problem List   Diagnosis Date Noted  . CVA (cerebral vascular accident) ([place]) 08/19/2018  . S/P lumbar spinal fusion 05/26/2018  . High risk medication use 02/03/2018  . Wound infection after surgery 01/11/2018  . S/P lumbar laminectomy 12/09/2017  . Paresthesia 05/11/2017  .  HNP (herniated nucleus pulposus), lumbar 03/29/2017  . Chronic right shoulder pain 02/18/2017  . Primary osteoarthritis of both hands 12/22/2016  . Primary osteoarthritis of both knees 12/22/2016  . Spondylosis of lumbar region without myelopathy or radiculopathy 12/22/2016  . Fibromyalgia 12/22/2016  . Preventative health care 04/19/2016  . Chronic back pain 12/17/2015  . Hyperlipidemia 08/29/2015  . Other organ or system involvement in systemic lupus erythematosus ([place]) 08/04/2015  . Rheumatoid factor positive 08/04/2015  . Sjogren's syndrome ([place]) 08/04/2015  . Mitral regurgitation   . Tobacco abuse   . Atypical chest pain   . Obesity (BMI 30-39.9)   . Anxiety   . Hypertension   . Diabetes mellitus without  complication ([place])   . Clinical depression 05/23/2014    Harrel Carina, MS, OTR/L 10/10/2018, 5:20 PM  [place] MAIN [facility] SERVICES [address] [place], [place], [address] Phone: [phone]   Fax:  [phone]  Name: GODDESS GEBBIA MRN: [id number] Date of Birth: 1966/11/03

## 2018-10-11 ENCOUNTER — Ambulatory Visit: Payer: BLUE CROSS/BLUE SHIELD | Admitting: Primary Care

## 2018-10-11 DIAGNOSIS — R200 Anesthesia of skin: Secondary | ICD-10-CM | POA: Insufficient documentation

## 2018-10-11 DIAGNOSIS — R29898 Other symptoms and signs involving the musculoskeletal system: Secondary | ICD-10-CM | POA: Insufficient documentation

## 2018-10-12 ENCOUNTER — Ambulatory Visit: Payer: Self-pay | Admitting: Physical Therapy

## 2018-10-12 ENCOUNTER — Encounter: Payer: Self-pay | Admitting: Occupational Therapy

## 2018-10-12 ENCOUNTER — Ambulatory Visit: Payer: BLUE CROSS/BLUE SHIELD | Admitting: Psychology

## 2018-10-17 ENCOUNTER — Ambulatory Visit: Payer: Self-pay

## 2018-10-17 ENCOUNTER — Ambulatory Visit: Payer: BLUE CROSS/BLUE SHIELD | Admitting: Occupational Therapy

## 2018-10-19 ENCOUNTER — Encounter: Payer: Self-pay | Admitting: Occupational Therapy

## 2018-10-19 ENCOUNTER — Ambulatory Visit: Payer: Self-pay

## 2018-10-19 ENCOUNTER — Ambulatory Visit: Payer: BLUE CROSS/BLUE SHIELD | Admitting: Psychology

## 2018-10-24 ENCOUNTER — Ambulatory Visit: Payer: Self-pay

## 2018-10-24 ENCOUNTER — Ambulatory Visit: Payer: BLUE CROSS/BLUE SHIELD | Admitting: Occupational Therapy

## 2018-10-26 ENCOUNTER — Ambulatory Visit: Payer: Self-pay

## 2018-10-26 ENCOUNTER — Ambulatory Visit: Payer: BLUE CROSS/BLUE SHIELD | Admitting: Psychology

## 2018-10-26 ENCOUNTER — Encounter: Payer: Self-pay | Admitting: Occupational Therapy

## 2018-11-01 ENCOUNTER — Ambulatory Visit: Payer: BLUE CROSS/BLUE SHIELD | Attending: Primary Care | Admitting: Occupational Therapy

## 2018-11-01 DIAGNOSIS — M6281 Muscle weakness (generalized): Secondary | ICD-10-CM | POA: Insufficient documentation

## 2018-11-01 DIAGNOSIS — R278 Other lack of coordination: Secondary | ICD-10-CM | POA: Insufficient documentation

## 2018-11-02 ENCOUNTER — Ambulatory Visit: Payer: BLUE CROSS/BLUE SHIELD | Admitting: Psychology

## 2018-11-15 ENCOUNTER — Encounter: Payer: Self-pay | Admitting: Occupational Therapy

## 2018-11-15 ENCOUNTER — Ambulatory Visit: Payer: BLUE CROSS/BLUE SHIELD | Admitting: Occupational Therapy

## 2018-11-15 DIAGNOSIS — M6281 Muscle weakness (generalized): Secondary | ICD-10-CM | POA: Diagnosis present

## 2018-11-15 DIAGNOSIS — R278 Other lack of coordination: Secondary | ICD-10-CM

## 2018-11-15 NOTE — Therapy (Addendum)
[place] MAIN [facility] SERVICES [address] [place], [place], [address] Phone: [phone]   Fax:  [phone]  Occupational Therapy Treatment  Patient Details  Name: Lauren Winters MRN: [id number] Date of Birth: 01-28-1966 No data recorded  Encounter Date: 11/15/2018  OT End of Session - 11/15/18 1704    Visit Number  5    Number of Visits  24    Date for OT Re-Evaluation  12/06/18    Authorization Type  visit 5 of 10 for progress report period starting 09/12/2018    OT Start Time  1645    OT Stop Time  1730    OT Time Calculation (min)  45 min    Activity Tolerance  Patient limited by pain;Patient limited by fatigue    Behavior During Therapy  [facility] for tasks assessed/performed       Past Medical History:  Diagnosis Date  . Anxiety   . Atypical chest pain    a. ETT 05/2014: normal stress test without evidence of myocardial ischemia or chest pain at peak stress  . Complication of anesthesia    migraine headache  . Depression   . Diabetes mellitus without complication ([place]) metformin   dx 2015  . Dyspnea    on exertion  . Fibromyalgia   . Headache   . Heart murmur    told back in 2007, but no mention with 2016 cardiac w/u  . Hypertension   . Lupus (systemic lupus erythematosus) ([place])   . Mitral regurgitation    a. 05/2014 echo: EF >55%, nl LV/RV sys fxn, mild MR/AR, no valvular stenosis   . Obesity (BMI 30-39.9)   . Raynaud's phenomenon   . Rheumatoid arthritis ([place])   . Sjogren's disease ([place])   . Spondylolisthesis of lumbar region   . Tobacco abuse    a. ongoing; b. ongoing SOB    Past Surgical History:  Procedure Laterality Date  . ABDOMINAL HYSTERECTOMY    . BACK SURGERY    . CESAREAN SECTION     x3  . LUMBAR LAMINECTOMY    . LUMBAR LAMINECTOMY/DECOMPRESSION MICRODISCECTOMY N/A 03/29/2017   Procedure: Re operative Right Lumbar Three-Four Laminectomy and discectomy with Left Lumbar Three-Four, Bilateral Lumbar  Four-Five Laminectomy for decompression;  Surgeon: Kevan Ny Ditty, MD;  Location: [place];  Service: Neurosurgery;  Laterality: N/A;  . SPINAL CORD STIMULATOR INSERTION  12/09/2017   Procedure: LUMBAR SPINAL CORD STIMULATOR IMPLANT;  Surgeon: Eustace Moore, MD;  Location: [place];  Service: Neurosurgery;;  . SPINAL CORD STIMULATOR REMOVAL N/A 01/11/2018   Procedure: LUMBAR SPINAL CORD STIMULATOR REMOVAL;  Surgeon: Eustace Moore, MD;  Location: [place];  Service: Neurosurgery;  Laterality: N/A;  . TUBAL LIGATION      There were no vitals filed for this visit.  Subjective Assessment - 11/15/18 1654    Subjective   Pt reports she has been sick with a cold and attending various appointments and as a result has not been able to come to treatment.    Pertinent History  Pt is a 52 y.o. female that lives with husband and daughter. Pt was admitted to [facility] at the end of Aug presenting with a CVA, a punctate acute infarction in the lateral right [place]. Pt was discharged after 3 days and received home health PT that recommended outpatient therapy. Prior to CVA, pt required increased assistance for ADLs and IADLs due to limitations secondary to complications with back pain. Pt desires to increase  independence for daily tasks to promote QoL and decrease caregiver burden    Currently in Pain?  Yes    Pain Score  6     Pain Location  Hand    Pain Orientation  Left    Pain Descriptors / Indicators  Burning    Pain Type  Chronic pain    Pain Onset  More than a month ago    Pain Frequency  Constant    Aggravating Factors   Using fingers for tasks; running water    Pain Relieving Factors  Going to sleep; rest    Multiple Pain Sites  Yes    Pain Score  3    Pain Location  Head    Pain Orientation  Left    Pain Descriptors / Indicators  Aching    Pain Type  Chronic pain    Pain Onset  More than a month ago    Pain Frequency  Constant    Aggravating Factors   Focusing on tasks/reading/watching tv for  prolonged periods    Pain Relieving Factors  OTC medication (Tylenol)      OT TREATMENT  Neuromuscular re-education:   Pt completed left hand [facility] skills warm up exercises including opening/closing hand, spreading/closing digits on tabletop, digit extension from tabletop, digit opposition, and thumb circles for 10 reps each. Pt requires increased time and demonstrates poor motor planning when completing exercises. Pt was educated on various [facility] tasks to complete at home and incorporate into her day. Pt was given a [facility] HEP handout. Pt verbalized understanding.                    OT Education - 11/15/18 1710    Education Details  left hand [place] skills    Person(s) Educated  Patient    Methods  Explanation;Demonstration;Verbal cues    Comprehension  Verbalized understanding;Returned demonstration;Verbal cues required          OT Long Term Goals - 09/12/18 1735      OT LONG TERM GOAL #1   Title  Pt will increase L grip strength by 6# in order to independently hike pants    Baseline  L grip strength is 1#; R grip strength is 15#; pt requires min A for hiking pants    Time  12    Period  Weeks    Status  New    Target Date  12/05/18      OT LONG TERM GOAL #2   Title  Pt will improve L lateral pinch strength by 3# in order to cut meat independently    Baseline  L pinch strength is 3#; pt is unable to cut meat    Time  12    Period  Weeks    Status  New    Target Date  12/05/18      OT LONG TERM GOAL #3   Title  Pt will increase L three point pinch by 3# in order to manage zippers independenly    Baseline  L three point pinch is 2#; pt requires increased time to manage zippers    Time  12    Period  Weeks    Status  New    Target Date  12/05/18      OT LONG TERM GOAL #4   Title  Pt will improve L hand [place] by 5 secs of speed to be able to open small containers during grooming & hygiene    Baseline  L hand [place] is 39 secs of speed; pt requires increased time to  open small containers    Time  12    Period  Weeks    Status  New    Target Date  12/05/18      OT LONG TERM GOAL #5   Title  Pt will be able to demonstrate visual compensatory strategies 100% of the time during ADLs and IADLs    Baseline  Vision is impaired    Time  12    Period  Weeks    Status  New    Target Date  12/05/18      Long Term Additional Goals   Additional Long Term Goals  Yes      OT LONG TERM GOAL #6   Title  Pt will be independently able to demonstrate sensory compensatory strategies for safe ADLs and IADLs    Baseline  Temperature sensation is impaired; pt has sensory changes through hand to forearm    Time  12    Period  Weeks    Status  New    Target Date  12/05/18      OT LONG TERM GOAL #7   Title  Pt will be able to demonstrate lower body dressing with adaptive equipment with modified independence    Baseline  Pt requires increased time and min assistance for lower body dressing; pt has back precautions from recent surgery    Time  12    Period  Weeks    Status  New    Target Date  12/05/18      OT LONG TERM GOAL #8   Title  Pt will demonstrate knowledge of proper use of toilet aids for independent toilet hygiene    Baseline  Pt has difficulty with thorough toilet hygiene and does not currently own any toilet aids    Time  12    Period  Weeks    Status  New    Target Date  12/05/18            Plan - 11/15/18 1709    Clinical Impression Statement  Pt reports that she has been sick and attending multiple appointments and has not been able to attend treatment as a result. Pt reports having a nerve conduction test that confirmed that she does not have carpal tunnel syndrome however she continues to experience pain, burning, and numbness in her left hand. Pt experiences increased pain, burning, and numbness the more she uses her left hand. Pt continues to work on left hand [facility] skills to promote independence during ADLs and IADLs.    Occupational  Profile and client history currently impacting functional performance  Pt is a mother and wife who is on disability.     Occupational performance deficits (Please refer to evaluation for details):  ADL's;IADL's;Leisure;Work    Rehab Potential  Good    Current Impairments/barriers affecting progress:  Positive indicators: age, motivated, family support. Negative indicators: multiple comorbidities    OT Frequency  2x / week    OT Duration  12 weeks    OT Treatment/Interventions  Self-care/ADL training;Therapeutic exercise;Visual/perceptual remediation/compensation;Neuromuscular education;Patient/family education;Therapeutic activities;Cognitive remediation/compensation;Manual Therapy;DME and/or AE instruction    Clinical Decision Making  Multiple treatment options, significant modification of task necessary    Recommended Other Services  PT, ST    Consulted and Agree with Plan of Care  Patient       Patient will benefit from skilled therapeutic intervention in order to improve  the following deficits and impairments:  Decreased cognition, Impaired vision/preception, Decreased knowledge of use of DME, Decreased coordination, Decreased mobility, Impaired sensation, Decreased activity tolerance, Decreased strength, Decreased range of motion, Impaired UE functional use  Visit Diagnosis: Muscle weakness (generalized)  Other lack of coordination    Problem List Patient Active Problem List   Diagnosis Date Noted  . CVA (cerebral vascular accident) ([place]) 08/19/2018  . S/P lumbar spinal fusion 05/26/2018  . High risk medication use 02/03/2018  . Wound infection after surgery 01/11/2018  . S/P lumbar laminectomy 12/09/2017  . Paresthesia 05/11/2017  . HNP (herniated nucleus pulposus), lumbar 03/29/2017  . Chronic right shoulder pain 02/18/2017  . Primary osteoarthritis of both hands 12/22/2016  . Primary osteoarthritis of both knees 12/22/2016  . Spondylosis of lumbar region without myelopathy  or radiculopathy 12/22/2016  . Fibromyalgia 12/22/2016  . Preventative health care 04/19/2016  . Chronic back pain 12/17/2015  . Hyperlipidemia 08/29/2015  . Other organ or system involvement in systemic lupus erythematosus ([place]) 08/04/2015  . Rheumatoid factor positive 08/04/2015  . Sjogren's syndrome ([place]) 08/04/2015  . Mitral regurgitation   . Tobacco abuse   . Atypical chest pain   . Obesity (BMI 30-39.9)   . Anxiety   . Hypertension   . Diabetes mellitus without complication ([place])   . Clinical depression 05/23/2014    Oliver Hum, OTS 11/15/2018, 5:16 PM   This entire session was performed under direct supervision and direction of a licensed therapist/therapist assistant . I have personally read, edited and approve of the note as written.  Harrel Carina, MS, OTR/L   [place] MAIN [facility] SERVICES [address] [place], [place], [address] Phone: [phone]   Fax:  [phone]  Name: Lauren Winters MRN: [id number] Date of Birth: 26-Jul-1966

## 2018-11-22 ENCOUNTER — Ambulatory Visit: Payer: BLUE CROSS/BLUE SHIELD | Admitting: Occupational Therapy

## 2018-11-29 ENCOUNTER — Ambulatory Visit: Payer: BLUE CROSS/BLUE SHIELD | Admitting: Occupational Therapy

## 2018-11-30 ENCOUNTER — Ambulatory Visit: Payer: BLUE CROSS/BLUE SHIELD | Admitting: Psychology

## 2018-12-06 ENCOUNTER — Ambulatory Visit: Payer: BLUE CROSS/BLUE SHIELD | Attending: Primary Care | Admitting: Occupational Therapy

## 2018-12-06 DIAGNOSIS — M79641 Pain in right hand: Secondary | ICD-10-CM | POA: Insufficient documentation

## 2018-12-06 DIAGNOSIS — R519 Headache, unspecified: Secondary | ICD-10-CM | POA: Insufficient documentation

## 2018-12-10 ENCOUNTER — Encounter: Payer: Self-pay | Admitting: Rheumatology

## 2018-12-11 NOTE — Telephone Encounter (Addendum)
Last Visit: 07/18/18 Next Visit: 01/23/19 Labs: 08/19/18 Creat. 1.02 Glucose 273  Okay to refill 30 day supply per Dr. Estanislado Pandy

## 2018-12-13 ENCOUNTER — Ambulatory Visit: Payer: BLUE CROSS/BLUE SHIELD | Admitting: Occupational Therapy

## 2018-12-14 ENCOUNTER — Other Ambulatory Visit: Payer: Self-pay | Admitting: Primary Care

## 2018-12-14 DIAGNOSIS — F419 Anxiety disorder, unspecified: Secondary | ICD-10-CM

## 2018-12-14 MED ORDER — HYDROXYCHLOROQUINE SULFATE 200 MG PO TABS: 200.0000 mg | ORAL_TABLET | Freq: Two times a day (BID) | ORAL | 0 refills | Status: DC

## 2018-12-14 MED ORDER — METHOTREXATE 2.5 MG PO TABS: 20.0000 mg | ORAL_TABLET | ORAL | 0 refills | Status: DC

## 2018-12-15 NOTE — Telephone Encounter (Signed)
Noted.  Refill sent to pharmacy. 

## 2018-12-15 NOTE — Telephone Encounter (Signed)
Last prescribed on 12/02/2017 Last office visit on 08/23/2018

## 2018-12-18 ENCOUNTER — Ambulatory Visit: Payer: BLUE CROSS/BLUE SHIELD | Admitting: Occupational Therapy

## 2018-12-21 ENCOUNTER — Ambulatory Visit: Payer: BLUE CROSS/BLUE SHIELD | Admitting: Psychology

## 2018-12-26 ENCOUNTER — Encounter: Payer: BLUE CROSS/BLUE SHIELD | Admitting: Occupational Therapy

## 2018-12-26 LAB — HM DIABETES EYE EXAM

## 2018-12-28 ENCOUNTER — Ambulatory Visit: Payer: BLUE CROSS/BLUE SHIELD | Admitting: Psychology

## 2019-01-03 ENCOUNTER — Encounter: Payer: BLUE CROSS/BLUE SHIELD | Admitting: Occupational Therapy

## 2019-01-04 ENCOUNTER — Other Ambulatory Visit: Payer: Self-pay | Admitting: Rheumatology

## 2019-01-04 ENCOUNTER — Ambulatory Visit: Payer: BLUE CROSS/BLUE SHIELD | Admitting: Psychology

## 2019-01-04 NOTE — Telephone Encounter (Addendum)
Last Visit 07/18/18 Next visit: 01/23/19 Labs: 08/19/18 Creat. 1.02 Glucose 273  Attempted to contact the patient and unable to leave a message as the phone kept ringing and ringing.   Okay to refill 30 day supply MTX?

## 2019-01-04 NOTE — Telephone Encounter (Signed)
Patient needs to get labs prior to methotrexate refill

## 2019-01-05 ENCOUNTER — Encounter: Payer: Self-pay | Admitting: Primary Care

## 2019-01-10 ENCOUNTER — Encounter: Payer: BLUE CROSS/BLUE SHIELD | Admitting: Occupational Therapy

## 2019-01-10 NOTE — Progress Notes (Deleted)
Office Visit Note  Patient: Lauren Winters             Date of Birth: September 19, 1966           MRN: [id number]             PCP: Pleas Koch, NP Referring: Pleas Koch, NP Visit Date: 01/23/2019 Occupation: @GUAROCC @  Subjective:  No chief complaint on file.  Methotrexate 8 tablets every 7 days and Plaquenil 200 mg twice daily. (Folic acid?)  Last Plaquenil eye exam normal on 12/26/2018.  Most recent CBC/CMP within normal limits except slightly elevated creatinine on 08/19/2018.  She is overdue for labs and CBC/CMP ordered for day will monitor every 3 months.  Patient previously had Pneumovax 23 and Zostavax.  Recommend annual flu and Prevnar 13 as indicated.  History of Present Illness: Lauren Winters is a 53 y.o. female ***   Activities of Daily Living:  Patient reports morning stiffness for *** {minute/hour:19697}.   Patient {ACTIONS;DENIES/REPORTS:21021675::"Denies"} nocturnal pain.  Difficulty dressing/grooming: {ACTIONS;DENIES/REPORTS:21021675::"Denies"} Difficulty climbing stairs: {ACTIONS;DENIES/REPORTS:21021675::"Denies"} Difficulty getting out of chair: {ACTIONS;DENIES/REPORTS:21021675::"Denies"} Difficulty using hands for taps, buttons, cutlery, and/or writing: {ACTIONS;DENIES/REPORTS:21021675::"Denies"}  No Rheumatology ROS completed.   PMFS History:  Patient Active Problem List   Diagnosis Date Noted  . CVA (cerebral vascular accident) ([place]) 08/19/2018  . S/P lumbar spinal fusion 05/26/2018  . High risk medication use 02/03/2018  . Wound infection after surgery 01/11/2018  . S/P lumbar laminectomy 12/09/2017  . Paresthesia 05/11/2017  . HNP (herniated nucleus pulposus), lumbar 03/29/2017  . Chronic right shoulder pain 02/18/2017  . Primary osteoarthritis of both hands 12/22/2016  . Primary osteoarthritis of both knees 12/22/2016  . Spondylosis of lumbar region without myelopathy or radiculopathy 12/22/2016  . Fibromyalgia 12/22/2016  . Preventative  health care 04/19/2016  . Chronic back pain 12/17/2015  . Hyperlipidemia 08/29/2015  . Other organ or system involvement in systemic lupus erythematosus ([place]) 08/04/2015  . Rheumatoid factor positive 08/04/2015  . Sjogren's syndrome ([place]) 08/04/2015  . Mitral regurgitation   . Tobacco abuse   . Atypical chest pain   . Obesity (BMI 30-39.9)   . Anxiety   . Hypertension   . Diabetes mellitus without complication ([place])   . Clinical depression 05/23/2014    Past Medical History:  Diagnosis Date  . Anxiety   . Atypical chest pain    a. ETT 05/2014: normal stress test without evidence of myocardial ischemia or chest pain at peak stress  . Complication of anesthesia    migraine headache  . Depression   . Diabetes mellitus without complication ([place]) metformin   dx 2015  . Dyspnea    on exertion  . Fibromyalgia   . Headache   . Heart murmur    told back in 2007, but no mention with 2016 cardiac w/u  . Hypertension   . Lupus (systemic lupus erythematosus) ([place])   . Mitral regurgitation    a. 05/2014 echo: EF >55%, nl LV/RV sys fxn, mild MR/AR, no valvular stenosis   . Obesity (BMI 30-39.9)   . Raynaud's phenomenon   . Rheumatoid arthritis ([place])   . Sjogren's disease ([place])   . Spondylolisthesis of lumbar region   . Tobacco abuse    a. ongoing; b. ongoing SOB    Family History  Problem Relation Age of Onset  . Hypertension Mother   . Valvular heart disease Mother   . CAD Father 89       CABG x 4  .  Liver cancer Father 51       passed  . CAD Paternal Aunt        CABG  . Breast cancer Paternal Aunt   . CAD Paternal Aunt        CABG  . Breast cancer Paternal Aunt   . CAD Paternal Aunt        CABG  . Breast cancer Paternal Aunt   . CAD Paternal Aunt        CABG  . Breast cancer Paternal Aunt   . CAD Paternal Aunt        CABG  . Breast cancer Paternal Aunt   . CAD Paternal Uncle        MI s/p CABG  . Stroke Maternal Grandmother   . Stroke Maternal Grandfather   .  CAD Maternal Grandfather    Past Surgical History:  Procedure Laterality Date  . ABDOMINAL HYSTERECTOMY    . BACK SURGERY    . CESAREAN SECTION     x3  . LUMBAR LAMINECTOMY    . LUMBAR LAMINECTOMY/DECOMPRESSION MICRODISCECTOMY N/A 03/29/2017   Procedure: Re operative Right Lumbar Three-Four Laminectomy and discectomy with Left Lumbar Three-Four, Bilateral Lumbar Four-Five Laminectomy for decompression;  Surgeon: Kevan Ny Ditty, MD;  Location: [place];  Service: Neurosurgery;  Laterality: N/A;  . SPINAL CORD STIMULATOR INSERTION  12/09/2017   Procedure: LUMBAR SPINAL CORD STIMULATOR IMPLANT;  Surgeon: Eustace Moore, MD;  Location: [place];  Service: Neurosurgery;;  . SPINAL CORD STIMULATOR REMOVAL N/A 01/11/2018   Procedure: LUMBAR SPINAL CORD STIMULATOR REMOVAL;  Surgeon: Eustace Moore, MD;  Location: [place];  Service: Neurosurgery;  Laterality: N/A;  . TUBAL LIGATION     Social History   Social History Narrative   Married.   3 children.   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    Immunization History  Administered Date(s) Administered  . Influenza,inj,Quad PF,6+ Mos 10/19/2016, 10/21/2017  . Pneumococcal Polysaccharide-23 10/27/2014  . Td 04/19/2016  . Zoster 10/27/2014     Objective: Vital Signs: There were no vitals taken for this visit.   Physical Exam   Musculoskeletal Exam: ***  CDAI Exam: CDAI Score: Not documented Patient Global Assessment: Not documented; Provider Global Assessment: Not documented Swollen: Not documented; Tender: Not documented Joint Exam   Not documented   There is currently no information documented on the homunculus. Go to the Rheumatology activity and complete the homunculus joint exam.  Investigation: No additional findings.  Imaging: No results found.  Recent Labs: Lab Results  Component Value Date   WBC 6.4 08/19/2018   HGB 13.1 08/19/2018   PLT 369 08/19/2018   NA 136 08/21/2018   K  4.0 08/21/2018   CL 97 (L) 08/21/2018   CO2 30 08/21/2018   GLUCOSE 273 (H) 08/21/2018   BUN 25 (H) 08/21/2018   CREATININE 1.30 (H) 08/21/2018   BILITOT 0.7 08/19/2018   ALKPHOS 124 08/19/2018   AST 22 08/19/2018   ALT 15 08/19/2018   PROT 8.7 (H) 08/19/2018   ALBUMIN 4.3 08/19/2018   CALCIUM 8.8 (L) 08/21/2018   GFRAA 53 (L) 08/21/2018    Speciality Comments: PLQ Eye Exam: 12/26/18 WNL @ [facility]   Procedures:  No procedures performed Allergies: Pseudoephedrine hcl; Clindamycin/lincomycin; Ancef [cefazolin]; Prednisone; Adhesive [tape]; Other; and Sudafed [pseudoephedrine hcl]   Assessment / Plan:     Visit Diagnoses: Other organ or system involvement in systemic lupus erythematosus ([place])  High risk  medication use - Otrexup 20 mg sq once weekly, PLQ 200 mg BID  Recurrent oral ulcers  Sjogren's syndrome with other organ involvement ([place])  Raynaud's syndrome without gangrene  Primary osteoarthritis of both hands  Primary osteoarthritis of both knees  Fibromyalgia  Vitamin D deficiency  S/P lumbar laminectomy  Tobacco abuse  Essential hypertension  Mitral valve insufficiency, unspecified etiology  Diabetes mellitus without complication ([place])   Orders: No orders of the defined types were placed in this encounter.  No orders of the defined types were placed in this encounter.   Face-to-face time spent with patient was *** minutes. Greater than 50% of time was spent in counseling and coordination of care.  Follow-Up Instructions: No follow-ups on file.   Ofilia Neas, PA-C  Note - This record has been created using Dragon software.  Chart creation errors have been sought, but may not always  have been located. Such creation errors do not reflect on  the standard of medical care.

## 2019-01-11 ENCOUNTER — Ambulatory Visit: Payer: BLUE CROSS/BLUE SHIELD | Admitting: Psychology

## 2019-01-17 ENCOUNTER — Encounter: Payer: BLUE CROSS/BLUE SHIELD | Admitting: Occupational Therapy

## 2019-01-18 ENCOUNTER — Ambulatory Visit: Payer: BLUE CROSS/BLUE SHIELD | Admitting: Psychology

## 2019-01-20 ENCOUNTER — Other Ambulatory Visit: Payer: Self-pay

## 2019-01-20 DIAGNOSIS — F419 Anxiety disorder, unspecified: Secondary | ICD-10-CM

## 2019-01-22 NOTE — Telephone Encounter (Signed)
I received this skype;   Hey! Lauren Winters (MRN: [id number]) put in an appt request via mychart for diabetes and numbness, back/hip pain, stroke follow up. It looks like she hasn't been seen since last year. Does she need to be triaged for this?   I spoke with pt; no new issues just wants diabetes FU, back pain and CVA FU from 07/2018 CVA. See my chart note Gentry Fitz NP agreed to see pt. appt scheduled 01/31/19 at 12 noon per pt request. Has other appts that day also per pt. FYI to Gentry Fitz NP.

## 2019-01-23 ENCOUNTER — Ambulatory Visit: Payer: BLUE CROSS/BLUE SHIELD | Admitting: Physician Assistant

## 2019-01-23 NOTE — Progress Notes (Signed)
Office Visit Note  Patient: Lauren Winters             Date of Birth: April 16, 1966           MRN: [id number]             PCP: Pleas Koch, NP Referring: Pleas Koch, NP Visit Date: 01/31/2019 Occupation: @GUAROCC @  Subjective:  Lower back pain   History of Present Illness: Lauren Winters is a 53 y.o. female with history of systemic lupus erythematosus, osteoarthritis, and Raynaud's.  She takes MTX 8 tablets po once weekly, folic acid 2 mg po daily, and PLQ 200 mg BID.   She has chronic lower back pain.  She has no oral or nasal ulcerations currently but has had nose sores over the past few months.  She continues to have sicca symptoms.  She denies any joint swelling.  She denies any recent rashes.  She had a rash after surgery in May 2019 but no rashes since then. She denies any palpitations or shortness of breath.   She has left sided weakness due to a previous stroke.   Activities of Daily Living:  Patient reports morning stiffness for several hours.   Patient Reports nocturnal pain.  Difficulty dressing/grooming: Reports Difficulty climbing stairs: Reports Difficulty getting out of chair: Reports Difficulty using hands for taps, buttons, cutlery, and/or writing: Reports  Review of Systems  Constitutional: Positive for fatigue.  HENT: Positive for mouth dryness. Negative for mouth sores and nose dryness.   Eyes: Positive for dryness. Negative for pain, itching and visual disturbance.  Respiratory: Negative for cough, hemoptysis, shortness of breath, wheezing and difficulty breathing.   Cardiovascular: Negative for chest pain, palpitations, hypertension and swelling in legs/feet.  Gastrointestinal: Negative for abdominal pain, blood in stool, constipation and diarrhea.  Endocrine: Negative for increased urination.  Genitourinary: Negative for painful urination, nocturia and pelvic pain.  Musculoskeletal: Positive for arthralgias, joint pain and morning stiffness.  Negative for joint swelling, myalgias, muscle weakness, muscle tenderness and myalgias.  Skin: Negative for color change, pallor, rash, hair loss, nodules/bumps, redness, skin tightness, ulcers and sensitivity to sunlight.  Allergic/Immunologic: Negative for susceptible to infections.  Neurological: Positive for dizziness and numbness.  Hematological: Negative for swollen glands.  Psychiatric/Behavioral: Positive for depressed mood and sleep disturbance. The patient is nervous/anxious.     PMFS History:  Patient Active Problem List   Diagnosis Date Noted  . CVA (cerebral vascular accident) ([place]) 08/19/2018  . S/P lumbar spinal fusion 05/26/2018  . High risk medication use 02/03/2018  . Wound infection after surgery 01/11/2018  . S/P lumbar laminectomy 12/09/2017  . Paresthesia 05/11/2017  . HNP (herniated nucleus pulposus), lumbar 03/29/2017  . Chronic right shoulder pain 02/18/2017  . Primary osteoarthritis of both hands 12/22/2016  . Primary osteoarthritis of both knees 12/22/2016  . Spondylosis of lumbar region without myelopathy or radiculopathy 12/22/2016  . Fibromyalgia 12/22/2016  . Preventative health care 04/19/2016  . Chronic back pain 12/17/2015  . Hyperlipidemia 08/29/2015  . Other organ or system involvement in systemic lupus erythematosus ([place]) 08/04/2015  . Rheumatoid factor positive 08/04/2015  . Sjogren's syndrome ([place]) 08/04/2015  . Mitral regurgitation   . Tobacco abuse   . Atypical chest pain   . Obesity (BMI 30-39.9)   . Anxiety   . Hypertension   . Diabetes mellitus without complication ([place])   . Clinical depression 05/23/2014    Past Medical History:  Diagnosis Date  . Anxiety   .  Atypical chest pain    a. ETT 05/2014: normal stress test without evidence of myocardial ischemia or chest pain at peak stress  . Complication of anesthesia    migraine headache  . Depression   . Diabetes mellitus without complication ([place]) metformin   dx 2015  . Dyspnea     on exertion  . Fibromyalgia   . Headache   . Heart murmur    told back in 2007, but no mention with 2016 cardiac w/u  . Hypertension   . Lupus (systemic lupus erythematosus) ([place])   . Mitral regurgitation    a. 05/2014 echo: EF >55%, nl LV/RV sys fxn, mild MR/AR, no valvular stenosis   . Obesity (BMI 30-39.9)   . Raynaud's phenomenon   . Rheumatoid arthritis ([place])   . Sjogren's disease ([place])   . Spondylolisthesis of lumbar region   . Tobacco abuse    a. ongoing; b. ongoing SOB    Family History  Problem Relation Age of Onset  . Hypertension Mother   . Valvular heart disease Mother   . CAD Father 91       CABG x 4  . Liver cancer Father 64       passed  . CAD Paternal Aunt        CABG  . Breast cancer Paternal Aunt   . CAD Paternal Aunt        CABG  . Breast cancer Paternal Aunt   . CAD Paternal Aunt        CABG  . Breast cancer Paternal Aunt   . CAD Paternal Aunt        CABG  . Breast cancer Paternal Aunt   . CAD Paternal Aunt        CABG  . Breast cancer Paternal Aunt   . CAD Paternal Uncle        MI s/p CABG  . Stroke Maternal Grandmother   . Stroke Maternal Grandfather   . CAD Maternal Grandfather    Past Surgical History:  Procedure Laterality Date  . ABDOMINAL HYSTERECTOMY    . BACK SURGERY    . CESAREAN SECTION     x3  . LUMBAR LAMINECTOMY    . LUMBAR LAMINECTOMY/DECOMPRESSION MICRODISCECTOMY N/A 03/29/2017   Procedure: Re operative Right Lumbar Three-Four Laminectomy and discectomy with Left Lumbar Three-Four, Bilateral Lumbar Four-Five Laminectomy for decompression;  Surgeon: Kevan Ny Ditty, MD;  Location: [facility];  Service: Neurosurgery;  Laterality: N/A;  . SPINAL CORD STIMULATOR INSERTION  12/09/2017   Procedure: LUMBAR SPINAL CORD STIMULATOR IMPLANT;  Surgeon: Eustace Moore, MD;  Location: [facility];  Service: Neurosurgery;;  . SPINAL CORD STIMULATOR REMOVAL N/A 01/11/2018   Procedure: LUMBAR SPINAL CORD STIMULATOR REMOVAL;  Surgeon: Eustace Moore, MD;  Location: [facility];  Service: Neurosurgery;  Laterality: N/A;  . TUBAL LIGATION     Social History   Social History Narrative   Married.   3 children.   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    Immunization History  Administered Date(s) Administered  . Influenza,inj,Quad PF,6+ Mos 10/19/2016, 10/21/2017, 01/31/2019  . Pneumococcal Polysaccharide-23 10/27/2014  . Td 04/19/2016  . Zoster 10/27/2014     Objective: Vital Signs: BP (!) 164/74 (BP Location: Left Arm, Patient Position: Sitting, Cuff Size: Large)   Pulse 80   Resp 13   Ht 5\' 6"  (1.676 m)   Wt 221 lb 6.4 oz (100.4 kg)   BMI 35.73 kg/m  Physical Exam Vitals signs and nursing note reviewed.  Constitutional:      Appearance: She is well-developed.  HENT:     Head: Normocephalic and atraumatic.  Eyes:     Conjunctiva/sclera: Conjunctivae normal.  Neck:     Musculoskeletal: Normal range of motion.  Cardiovascular:     Rate and Rhythm: Normal rate and regular rhythm.     Heart sounds: Normal heart sounds.  Pulmonary:     Effort: Pulmonary effort is normal.     Breath sounds: Normal breath sounds.  Abdominal:     General: Bowel sounds are normal.     Palpations: Abdomen is soft.  Lymphadenopathy:     Cervical: No cervical adenopathy.  Skin:    General: Skin is warm and dry.     Capillary Refill: Capillary refill takes less than 2 seconds.  Neurological:     Mental Status: She is alert and oriented to person, place, and time.  Psychiatric:        Behavior: Behavior normal.      Musculoskeletal Exam: Generalized hyperalgesia and positive tender points. C-spine good ROM. thoracic and lumbar spine limited ROM with discomfort.  Midline spinal tenderness in the lumbar region. Shoulder joints, elbow joints, wrist joints, MCPs, PIPs, and DIPs good ROM with no synovitis.  Complete fist formation bilaterally.  Hip joints, knee joints, ankle joints, MTPs, PIPs ,and DIPs good  ROM with no synovitis.  No warmth or effusion of knee joints.  No tenderness or swelling of ankle joints.    CDAI Exam: CDAI Score: Not documented Patient Global Assessment: Not documented; Provider Global Assessment: Not documented Swollen: Not documented; Tender: Not documented Joint Exam   Not documented   There is currently no information documented on the homunculus. Go to the Rheumatology activity and complete the homunculus joint exam.  Investigation: No additional findings.  Imaging: No results found.  Recent Labs: Lab Results  Component Value Date   WBC 6.4 08/19/2018   HGB 13.1 08/19/2018   PLT 369 08/19/2018   NA 136 08/21/2018   K 4.0 08/21/2018   CL 97 (L) 08/21/2018   CO2 30 08/21/2018   GLUCOSE 273 (H) 08/21/2018   BUN 25 (H) 08/21/2018   CREATININE 1.30 (H) 08/21/2018   BILITOT 0.7 08/19/2018   ALKPHOS 124 08/19/2018   AST 22 08/19/2018   ALT 15 08/19/2018   PROT 8.7 (H) 08/19/2018   ALBUMIN 4.3 08/19/2018   CALCIUM 8.8 (L) 08/21/2018   GFRAA 53 (L) 08/21/2018    Speciality Comments: PLQ Eye Exam: 12/26/18 WNL @ [facility]   Procedures:  No procedures performed Allergies: Pseudoephedrine hcl; Clindamycin/lincomycin; Ancef [cefazolin]; Prednisone; Adhesive [tape]; Other; and Sudafed [pseudoephedrine hcl]   Assessment / Plan:     Visit Diagnoses: Other organ or system involvement in systemic lupus erythematosus (HCC) - +La, oral ulcers, malar rash, fatigue, photosensitivity, Raynaud's, Subcutaneous lupus-bx+, hair loss, +RF, sicca symptoms: She has not had any recent lupus flares.  She is clinically doing well on methotrexate tablets by mouth once weekly, folic acid 2 mg by mouth daily, Plaquenil 200 mg 1 tablet by mouth twice daily.  She has no synovitis on exam today.  She has not had any recent rashes.  No discoid lesions or malar rash was noted on exam.  She has not had any photosensitivity or hair loss recently.  She has not had any symptoms of  Raynaud's recently no digital ulcerations or signs of gangrene were noted.  She does have chronic  sicca symptoms.  She takes pilocarpine as needed.  She has no parotid swelling or tenderness on exam today.  She has not had any palpitations or shortness of breath.  Her lungs are clear to auscultation on exam today.  She has no oral or nasal ulcerations today but continues to have recurrent nasal and oral ulcerations.  She continues to have chronic fatigue and insomnia.  No cervical lymphadenopathy was palpated.  She will continue on the current treatment regimen.  Refills of Plaquenil, methotrexate, and folic acid were sent to the pharmacy today.  Autoimmune labs obtained today.  She was advised to notify us if she develops any new or worsening symptoms.  She will follow-up in the office in 5 months.- Plan: CBC with Differential/Platelet, COMPLETE METABOLIC PANEL WITH GFR, Urinalysis, Routine w reflex microscopic, Anti-DNA antibody, double-stranded, C3 and C4, Sedimentation rate, ANA, Rheumatoid factor, VITAMIN D 25 Hydroxy (Vit-D Deficiency, Fractures), Serum protein electrophoresis with reflex  High risk medication use - MTX 8 tabs po once weekly, folic acid 2 mg po daily, PLQ 200 mg 1 tablet BID.  CBC and CMP were drawn today to monitor for drug toxicity.  Plaquenil eye exam was normal on 12/26/2018.- Plan: CBC with Differential/Platelet, COMPLETE METABOLIC PANEL WITH GFR  Recurrent oral ulcers: She has no oral or nasal ulcerations on exam today.  She continues to have recurrent oral nasal ulcerations though.  Sjogren's syndrome with other organ involvement (Grandfather) -  RF+: She takes Pilocarpine 5 mg PRN which helps with sicca symptoms.  She has no parotid swelling or tenderness on exam today.  We will check CBC, CMP, UA, RF, and SPEP today.  She does not need any refills of pilocarpine at this time.  She will continue on Plaquenil 200 mg 1 tablet by mouth twice daily.- Plan: CBC with Differential/Platelet,  COMPLETE METABOLIC PANEL WITH GFR, Urinalysis, Routine w reflex microscopic, Serum protein electrophoresis with reflex  Raynaud's syndrome without gangrene: She has no symptoms of Raynolds at this time.  She is no digital ulcerations or signs of gangrene.  She was encouraged to keep her core body temperature warm and wear gloves.  Primary osteoarthritis of both hands: She has no synovitis on exam.  She has complete fist formation bilaterally.  Joint protection and muscle strengthening were discussed.  Primary osteoarthritis of both knees: No warmth or effusion.  Good range of motion.  She walks with a cane.  Fibromyalgia: She continues have generalized hyperalgesia and positive tender points on exam.  She has generalized muscle aches and muscle tenderness.  She has chronic lower back pain and muscle spasms.  She continues have chronic fatigue and insomnia.  Pain of both hip joints: She is been having increased pain in bilateral hip joints.  She has limited range of motion on exam with discomfort.  She has tenderness over bilateral trochanter bursa.  X-rays of both hips were obtained today.  X-rays were consistent with mild osteoarthritis of the hip joints.  No SI joint changes were noted.  S/P lumbar laminectomy: She has chronic lower back pain.  She is limited range of motion with midline spinal tenderness in the lumbar region.   Vitamin D deficiency -Vitamin D level be checked today.  Plan: VITAMIN D 25 Hydroxy (Vit-D Deficiency, Fractures)  History of chronic kidney disease - She reports she was recently diagnosed with chronic kidney disease.  She is followed by Dr. Smith Mince.   Other medical conditions are listed as follows:  Tobacco abuse  Essential  hypertension  Mitral valve insufficiency, unspecified etiology  Diabetes mellitus without complication ([place])    Orders: Orders Placed This Encounter  Procedures  . CBC with Differential/Platelet  . COMPLETE METABOLIC PANEL WITH GFR  .  Urinalysis, Routine w reflex microscopic  . Anti-DNA antibody, double-stranded  . C3 and C4  . Sedimentation rate  . ANA  . Rheumatoid factor  . VITAMIN D 25 Hydroxy (Vit-D Deficiency, Fractures)  . Serum protein electrophoresis with reflex   Meds ordered this encounter  Medications  . methotrexate (RHEUMATREX) 2.5 MG tablet    Sig: Take 8 tablets (20 mg total) by mouth once a week. Caution:Chemotherapy. Protect from light.    Dispense:  96 tablet    Refill:  0  . hydroxychloroquine (PLAQUENIL) 200 MG tablet    Sig: Take 1 tablet (200 mg total) by mouth 2 (two) times daily.    Dispense:  180 tablet    Refill:  0  . folic acid (FOLVITE) 1 MG tablet    Sig: Take 2 tablets by mouth daily.    Dispense:  180 tablet    Refill:  1    Face-to-face time spent with patient was 30 minutes. Greater than 50% of time was spent in counseling and coordination of care.  Follow-Up Instructions: Return in about 5 months (around 07/01/2019) for Systemic lupus erythematosus, Raynaud's syndrome, Sjogren's syndrome.   Ofilia Neas, PA-C   I examined and evaluated the patient with Hazel Sams PA.  Patient had no synovitis on examination today.  She had discomfort with range of motion of her bilateral hip joints and limited range of motion.  X-ray obtained today was unremarkable.  She continues to have some oral ulcers and fatigue.  Will obtain labs today.  She will continue methotrexate and Plaquenil combination.  The plan of care was discussed as noted above.  Bo Merino, MD  Note - This record has been created using Editor, commissioning.  Chart creation errors have been sought, but may not always  have been located. Such creation errors do not reflect on  the standard of medical care.

## 2019-01-24 ENCOUNTER — Encounter: Payer: Self-pay | Admitting: Occupational Therapy

## 2019-01-25 ENCOUNTER — Ambulatory Visit: Payer: BLUE CROSS/BLUE SHIELD | Admitting: Psychology

## 2019-01-31 ENCOUNTER — Encounter: Payer: Self-pay | Admitting: Primary Care

## 2019-01-31 ENCOUNTER — Ambulatory Visit (INDEPENDENT_AMBULATORY_CARE_PROVIDER_SITE_OTHER): Payer: BLUE CROSS/BLUE SHIELD

## 2019-01-31 ENCOUNTER — Encounter: Payer: Self-pay | Admitting: Physician Assistant

## 2019-01-31 ENCOUNTER — Ambulatory Visit (INDEPENDENT_AMBULATORY_CARE_PROVIDER_SITE_OTHER): Payer: BLUE CROSS/BLUE SHIELD | Admitting: Primary Care

## 2019-01-31 ENCOUNTER — Ambulatory Visit: Payer: BLUE CROSS/BLUE SHIELD | Admitting: Physician Assistant

## 2019-01-31 VITALS — BP 110/72 | HR 84 | Temp 98.2°F | Ht 66.0 in | Wt 219.2 lb

## 2019-01-31 VITALS — BP 164/74 | HR 80 | Resp 13 | Ht 66.0 in | Wt 221.4 lb

## 2019-01-31 DIAGNOSIS — I340 Nonrheumatic mitral (valve) insufficiency: Secondary | ICD-10-CM

## 2019-01-31 DIAGNOSIS — M25551 Pain in right hip: Secondary | ICD-10-CM

## 2019-01-31 DIAGNOSIS — E785 Hyperlipidemia, unspecified: Secondary | ICD-10-CM | POA: Diagnosis not present

## 2019-01-31 DIAGNOSIS — I10 Essential (primary) hypertension: Secondary | ICD-10-CM | POA: Diagnosis not present

## 2019-01-31 DIAGNOSIS — K1379 Other lesions of oral mucosa: Secondary | ICD-10-CM | POA: Diagnosis not present

## 2019-01-31 DIAGNOSIS — G8929 Other chronic pain: Secondary | ICD-10-CM

## 2019-01-31 DIAGNOSIS — M170 Bilateral primary osteoarthritis of knee: Secondary | ICD-10-CM

## 2019-01-31 DIAGNOSIS — M3219 Other organ or system involvement in systemic lupus erythematosus: Secondary | ICD-10-CM | POA: Diagnosis not present

## 2019-01-31 DIAGNOSIS — M3509 Sicca syndrome with other organ involvement: Secondary | ICD-10-CM

## 2019-01-31 DIAGNOSIS — F419 Anxiety disorder, unspecified: Secondary | ICD-10-CM

## 2019-01-31 DIAGNOSIS — M19041 Primary osteoarthritis, right hand: Secondary | ICD-10-CM

## 2019-01-31 DIAGNOSIS — Z23 Encounter for immunization: Secondary | ICD-10-CM | POA: Diagnosis not present

## 2019-01-31 DIAGNOSIS — M25552 Pain in left hip: Secondary | ICD-10-CM | POA: Diagnosis not present

## 2019-01-31 DIAGNOSIS — M797 Fibromyalgia: Secondary | ICD-10-CM

## 2019-01-31 DIAGNOSIS — Z9889 Other specified postprocedural states: Secondary | ICD-10-CM

## 2019-01-31 DIAGNOSIS — Z87448 Personal history of other diseases of urinary system: Secondary | ICD-10-CM

## 2019-01-31 DIAGNOSIS — I639 Cerebral infarction, unspecified: Secondary | ICD-10-CM

## 2019-01-31 DIAGNOSIS — M19042 Primary osteoarthritis, left hand: Secondary | ICD-10-CM

## 2019-01-31 DIAGNOSIS — Z720 Tobacco use: Secondary | ICD-10-CM

## 2019-01-31 DIAGNOSIS — E119 Type 2 diabetes mellitus without complications: Secondary | ICD-10-CM

## 2019-01-31 DIAGNOSIS — Z79899 Other long term (current) drug therapy: Secondary | ICD-10-CM | POA: Diagnosis not present

## 2019-01-31 DIAGNOSIS — E559 Vitamin D deficiency, unspecified: Secondary | ICD-10-CM

## 2019-01-31 DIAGNOSIS — I7300 Raynaud's syndrome without gangrene: Secondary | ICD-10-CM

## 2019-01-31 DIAGNOSIS — F3342 Major depressive disorder, recurrent, in full remission: Secondary | ICD-10-CM

## 2019-01-31 DIAGNOSIS — M545 Low back pain, unspecified: Secondary | ICD-10-CM

## 2019-01-31 LAB — LIPID PANEL
Cholesterol: 178 mg/dL (ref 0–200)
HDL: 43.6 mg/dL (ref >39.00)
LDL Cholesterol: 107 mg/dL — ABNORMAL HIGH (ref 0–99)
NONHDL: 134.87
Total CHOL/HDL Ratio: 4
Triglycerides: 139 mg/dL (ref 0.0–149.0)
VLDL: 27.8 mg/dL (ref 0.0–40.0)

## 2019-01-31 LAB — HEMOGLOBIN A1C: HEMOGLOBIN A1C: 5.2 % (ref 4.6–6.5)

## 2019-01-31 MED ORDER — HYDROXYCHLOROQUINE SULFATE 200 MG PO TABS: 200.0000 mg | ORAL_TABLET | Freq: Two times a day (BID) | ORAL | 0 refills | Status: DC

## 2019-01-31 MED ORDER — METHOTREXATE 2.5 MG PO TABS: 20.0000 mg | ORAL_TABLET | ORAL | 0 refills | Status: DC

## 2019-01-31 MED ORDER — FOLIC ACID 1 MG PO TABS: ORAL_TABLET | ORAL | 1 refills | Status: DC

## 2019-01-31 NOTE — Assessment & Plan Note (Signed)
No new symptoms since stroke last summer. Repeat lipids pending since increased dose of Crestor. Referral placed to neurology for follow-up.

## 2019-01-31 NOTE — Assessment & Plan Note (Signed)
Not checking glucose regularly, what readings we do have are above goal. Strongly advised she check her glucose twice daily, rotating times of checks.  Glucose logs provided today for her to use to record readings.  We will repeat A1c today and adjust treatment accordingly. Continue statin and ARB. Pneumonia vaccination due in November 2020. Foot exam and eye exam up-to-date.  Follow-up in 6 months.

## 2019-01-31 NOTE — Progress Notes (Signed)
Subjective:    Patient ID: Lauren Winters, female    DOB: March 12, 1966, 53 y.o.   MRN: [id number]  HPI  Lauren Winters is a 53 year old female who presents today for follow up.  1) Type 2 Diabetes:  Current medications include: Lantus 12 units HS, Metformin ER 1000 mg daily.  She is checking her blood glucose once weekly and is getting readings of: AM fasting: high 200's  Highest reading: 314 in MD office Lowest reading: high 200's  Last A1C: 8.9 in August 2019, due today Last Eye Exam: Completed in December 2019 Last Foot Exam: Completed in April 2019 Pneumonia Vaccination: Due in November 2020 ACE/ARB: ARB Statin: rosuvastatin   Diet currently consists of:  Breakfast: Cheese toast, protein bar Lunch: Salads, soups Dinner: Chili, grilled chicken, veggies Snacks: Protein bar Desserts: None Beverages: Little water, diet soda, protein shakes  Exercise: She is not exercising   2) CKD: Currently following with nephrology. No longer on clonidine 0.1 mg. Overall stable renal function and with prior labs was back down to CKD stage 2. She is due for follow up next week.   BP Readings from Last 3 Encounters:  01/31/19 110/72  08/23/18 108/70  08/21/18 106/63   3) Essential Hypertension: Currently managed on valsartan-hydrochlorothiazide, metoprolol tartrate 100 mg twice daily, amlodipine 10 mg.  No longer on clonidine 0.1 mg as this was removed by her nephrologist due to spikes in blood pressure.  4) Lupus/Osteoarthritis/Sjogren's/Chronic Back Pain: Managed on hydroxychlorquine 200 mg BID, Methotrexate 2.5 mg once weekly, pilocarpine 5 mg PRN, tacrolimus ointment PRN triamcinolone cream PRN, halcinonide PRN. Currently following with rheumatology, was referred to neurosurgery for chronic back issues.   5) Anxiety and Depression: Currently managed on Lexapro 20 mg and Wellbutrin XL 150 mg daily.  Overall feels well managed except for the last several months.  She has had some mood  swings, hot flashes.  History of hysterectomy, feels like she is going through menopause.  6) CVA: Occurred in Summer 2019. Compliant to increased dose of Crestor 10 mg. Tested negative for carpal tunnel in Fall 2019, has had chronic pain to her left hand and wrist with numbness/tingling. Since her stroke she's had difficulty with vision, decreased vision to the periphery; daily headache; also balance issues feeling unsteady, numbness/burning from fingers to wrist.   She's seen her optometrist. Headaches are located behind her left eye mostly, also sometimes tension/pressure around the entire head. She endorses photophobia and occasional nausea with headaches.  She has not yet followed back up with neurology since her stroke as her current neurologist is now out of network.  Review of Systems  Respiratory: Negative for shortness of breath.   Cardiovascular: Positive for chest pain.  Genitourinary:       Hot flashes  Musculoskeletal: Positive for arthralgias and back pain.  Neurological: Positive for light-headedness, numbness and headaches.  Psychiatric/Behavioral:       See HPI       Past Medical History:  Diagnosis Date  . Anxiety   . Atypical chest pain    a. ETT 05/2014: normal stress test without evidence of myocardial ischemia or chest pain at peak stress  . Complication of anesthesia    migraine headache  . Depression   . Diabetes mellitus without complication ([place]) metformin   dx 2015  . Dyspnea    on exertion  . Fibromyalgia   . Headache   . Heart murmur    told back in 2007, but no  mention with 2016 cardiac w/u  . Hypertension   . Lupus (systemic lupus erythematosus) ([place])   . Mitral regurgitation    a. 05/2014 echo: EF >55%, nl LV/RV sys fxn, mild MR/AR, no valvular stenosis   . Obesity (BMI 30-39.9)   . Raynaud's phenomenon   . Rheumatoid arthritis ([place])   . Sjogren's disease ([place])   . Spondylolisthesis of lumbar region   . Tobacco abuse    a. ongoing; b. ongoing  SOB     Social History   Socioeconomic History  . Marital status: Married    Spouse name: Not on file  . Number of children: Not on file  . Years of education: Not on file  . Highest education level: Not on file  Occupational History  . Not on file  Social Needs  . Financial resource strain: Not on file  . Food insecurity:    Worry: Not on file    Inability: Not on file  . Transportation needs:    Medical: Not on file    Non-medical: Not on file  Tobacco Use  . Smoking status: Former Smoker    Packs/day: 0.50    Years: 10.00    Pack years: 5.00    Types: Cigarettes    Last attempt to quit: 07/22/2015    Years since quitting: 3.5  . Smokeless tobacco: Never Used  Substance and Sexual Activity  . Alcohol use: No    Alcohol/week: 0.0 standard drinks  . Drug use: No  . Sexual activity: Not Currently  Lifestyle  . Physical activity:    Days per week: Not on file    Minutes per session: Not on file  . Stress: Not on file  Relationships  . Social connections:    Talks on phone: Not on file    Gets together: Not on file    Attends religious service: Not on file    Active member of club or organization: Not on file    Attends meetings of clubs or organizations: Not on file    Relationship status: Not on file  . Intimate partner violence:    Fear of current or ex partner: Not on file    Emotionally abused: Not on file    Physically abused: Not on file    Forced sexual activity: Not on file  Other Topics Concern  . Not on file  Social History Narrative   Married.   3 children.   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     Past Surgical History:  Procedure Laterality Date  . ABDOMINAL HYSTERECTOMY    . BACK SURGERY    . CESAREAN SECTION     x3  . LUMBAR LAMINECTOMY    . LUMBAR LAMINECTOMY/DECOMPRESSION MICRODISCECTOMY N/A 03/29/2017   Procedure: Re operative Right Lumbar Three-Four Laminectomy and discectomy with Left Lumbar  Three-Four, Bilateral Lumbar Four-Five Laminectomy for decompression;  Surgeon: Kevan Ny Ditty, MD;  Location: [facility];  Service: Neurosurgery;  Laterality: N/A;  . SPINAL CORD STIMULATOR INSERTION  12/09/2017   Procedure: LUMBAR SPINAL CORD STIMULATOR IMPLANT;  Surgeon: Eustace Moore, MD;  Location: [facility];  Service: Neurosurgery;;  . SPINAL CORD STIMULATOR REMOVAL N/A 01/11/2018   Procedure: LUMBAR SPINAL CORD STIMULATOR REMOVAL;  Surgeon: Eustace Moore, MD;  Location: [facility];  Service: Neurosurgery;  Laterality: N/A;  . TUBAL LIGATION      Family History  Problem Relation Age of Onset  . Hypertension Mother   .  Valvular heart disease Mother   . CAD Father 83       CABG x 4  . Liver cancer Father 41       passed  . CAD Paternal Aunt        CABG  . Breast cancer Paternal Aunt   . CAD Paternal Aunt        CABG  . Breast cancer Paternal Aunt   . CAD Paternal Aunt        CABG  . Breast cancer Paternal Aunt   . CAD Paternal Aunt        CABG  . Breast cancer Paternal Aunt   . CAD Paternal Aunt        CABG  . Breast cancer Paternal Aunt   . CAD Paternal Uncle        MI s/p CABG  . Stroke Maternal Grandmother   . Stroke Maternal Grandfather   . CAD Maternal Grandfather     Allergies  Allergen Reactions  . Pseudoephedrine Hcl Shortness Of Breath and Other (See Comments)    Room starts spinning, can't breath  . Clindamycin/Lincomycin Hives  . Ancef [Cefazolin] Other (See Comments)    Unknown  . Prednisone Other (See Comments)    Chest pain w/ cortisone shots or prednisone pills Elevated blood pressure  . Adhesive [Tape] Rash and Other (See Comments)    Sensitivity to certain band aids and adhesives - burns skin, makes skin raw  . Other Rash and Other (See Comments)    Sensitivity to certain band aids and adhesives - burns skin, makes skin raw  . Sudafed [Pseudoephedrine Hcl] Other (See Comments)    Dizziness     Current Outpatient Medications on File Prior to Visit    Medication Sig Dispense Refill  . amLODipine (NORVASC) 10 MG tablet Take 1 tablet once daily for blood pressure. (Patient taking differently: Take 10 mg by mouth daily. ) 90 tablet 3  . buPROPion (WELLBUTRIN XL) 150 MG 24 hr tablet Take 1 tablet by mouth once daily for anxiety and depression. (Patient taking differently: Take 150 mg by mouth daily. ) 90 tablet 3  . clobetasol cream (TEMOVATE) 1.88 % Apply 1 application topically 2 (two) times daily as needed (for lupis rash).    . cyclobenzaprine (FLEXERIL) 10 MG tablet Take 1 tablet (10 mg total) by mouth 3 (three) times daily as needed for muscle spasms. 270 tablet 0  . escitalopram (LEXAPRO) 20 MG tablet TAKE 1 TABLET BY MOUTH EVERY DAY 90 tablet 0  . gabapentin (NEURONTIN) 300 MG capsule Take 600 mg by mouth 3 (three) times daily as needed (for pain).   0  . Halcinonide 0.1 % OINT Apply 1 application topically 2 (two) times daily as needed (for lupis rash).     . hydroxychloroquine (PLAQUENIL) 200 MG tablet Take 1 tablet (200 mg total) by mouth 2 (two) times daily. 60 tablet 0  . Insulin Glargine (LANTUS) 100 UNIT/ML Solostar Pen Inject 12 Units into the skin every evening. 15 mL 5  . Insulin Pen Needle (PEN NEEDLES) 31G X 6 MM MISC Use with insulin as directed. 100 each 5  . metFORMIN (GLUCOPHAGE-XR) 500 MG 24 hr tablet Take 2 tablets every morning with food for diabetes. (Patient taking differently: Take 1,000 mg by mouth daily with breakfast. ) 180 tablet 3  . methotrexate (RHEUMATREX) 2.5 MG tablet Take 8 tablets (20 mg total) by mouth once a week. Caution:Chemotherapy. Protect from light. 32 tablet 0  .  metoprolol tartrate (LOPRESSOR) 100 MG tablet Take 1 tablet by mouth twice daily for blood pressure. (Patient taking differently: Take 100 mg by mouth 2 (two) times daily. Take 1 tablet by mouth twice daily for blood pressure.) 180 tablet 3  . pilocarpine (SALAGEN) 5 MG tablet Take 5 mg by mouth daily as needed (for dry mouth).     .  rosuvastatin (CRESTOR) 10 MG tablet Take 1 tablet (10 mg total) by mouth daily at 6 PM. Take 1 tablet by mouth at bedtime for cholesterol. 30 tablet 2  . tacrolimus (PROTOPIC) 0.1 % ointment Apply 1 application topically 2 (two) times daily as needed (for lupis rash).     . Triamcinolone Acetonide (TRIAMCINOLONE 0.1 % CREAM : EUCERIN) CREA Apply 1 application topically 2 (two) times daily as needed for rash or irritation.     . valsartan-hydrochlorothiazide (DIOVAN-HCT) 320-25 MG tablet Take 1 tablet by mouth once daily for blood pressure. (Patient taking differently: Take 1 tablet by mouth at bedtime. ) 90 tablet 3  . Vitamin D, Ergocalciferol, (DRISDOL) 50000 units CAPS capsule Take 1 capsule (50,000 Units total) by mouth 2 (two) times a week. (Patient taking differently: Take 50,000 Units by mouth every Friday. ) 24 capsule 0   No current facility-administered medications on file prior to visit.     BP 110/72   Pulse 84   Temp 98.2 F (36.8 C) (Oral)   Ht 5\' 6"  (1.676 m)   Wt 219 lb 4 oz (99.5 kg)   SpO2 98%   BMI 35.39 kg/m    Objective:   Physical Exam  Constitutional: She appears well-nourished.  Neck: Neck supple.  Cardiovascular: Normal rate and regular rhythm.  Respiratory: Effort normal and breath sounds normal.  Skin: Skin is warm and dry.  Psychiatric: She has a normal mood and affect.           Assessment & Plan:

## 2019-01-31 NOTE — Assessment & Plan Note (Signed)
Repeat lipid panel pending since dose increase of Crestor to 10 mg.

## 2019-01-31 NOTE — Assessment & Plan Note (Signed)
Overall stable on current regimen continue same.

## 2019-01-31 NOTE — Patient Instructions (Signed)
Start checking your blood sugars at least twice daily:  Before any meal 2 hours after a meal Bedtime  Send me some glucose readings via my chart in 2 weeks.  Stop by the lab prior to leaving today. I will notify you of your results once received.   You will be contacted regarding your referral to neurology.  Please let us know if you have not been contacted within one week.   Follow up with the kidney doctor as scheduled.  Please schedule a follow up appointment in 6 months for diabetes check.   .It was a pleasure to see you today!

## 2019-01-31 NOTE — Assessment & Plan Note (Signed)
Will be scheduled with neurosurgery for follow-up.

## 2019-01-31 NOTE — Assessment & Plan Note (Signed)
Following with rheumatology.  Continue same regimen.

## 2019-01-31 NOTE — Assessment & Plan Note (Addendum)
Following with rheumatology, continue current regimen. 

## 2019-01-31 NOTE — Assessment & Plan Note (Signed)
Deteriorated over the last 2 months, overall stable. We decided to continue her current regimen and monitor her symptoms. We will readdress in 6 months, she will notify if symptoms progress.

## 2019-01-31 NOTE — Assessment & Plan Note (Signed)
Significant improvement on current regimen. She believes she is on another medication and will send this via my chart. Continue current regimen.

## 2019-02-01 NOTE — Progress Notes (Signed)
CBC WNL. Glucose is very elevated-260. Rest of CMP WNL. UA revealed 3+ glucose.  Please notify patient and forward results to PCP. Sed rate WNL. Complements WNL. Vitamin D is low-15.  Please notify patient and send in vitamin D 50,000 units by mouth twice weekly for 3 months.  We will recheck vitamin D in 3 months.  RF positive.

## 2019-02-02 LAB — CBC WITH DIFFERENTIAL/PLATELET
Absolute Monocytes: 302 cells/uL (ref 200–950)
BASOS ABS: 48 {cells}/uL (ref 0–200)
Basophils Relative: 0.9 %
EOS ABS: 122 {cells}/uL (ref 15–500)
Eosinophils Relative: 2.3 %
HCT: 37.3 % (ref 35.0–45.0)
Hemoglobin: 12.8 g/dL (ref 11.7–15.5)
Lymphs Abs: 1791 cells/uL (ref 850–3900)
MCH: 27.8 pg (ref 27.0–33.0)
MCHC: 34.3 g/dL (ref 32.0–36.0)
MCV: 80.9 fL (ref 80.0–100.0)
MPV: 9.9 fL (ref 7.5–12.5)
Monocytes Relative: 5.7 %
Neutro Abs: 3037 cells/uL (ref 1500–7800)
Neutrophils Relative %: 57.3 %
Platelets: 307 10*3/uL (ref 140–400)
RBC: 4.61 10*6/uL (ref 3.80–5.10)
RDW: 14.4 % (ref 11.0–15.0)
Total Lymphocyte: 33.8 %
WBC: 5.3 10*3/uL (ref 3.8–10.8)

## 2019-02-02 LAB — ANA: Anti Nuclear Antibody(ANA): POSITIVE — AB

## 2019-02-02 LAB — URINALYSIS, ROUTINE W REFLEX MICROSCOPIC
Bilirubin Urine: NEGATIVE
Hgb urine dipstick: NEGATIVE
Ketones, ur: NEGATIVE
Leukocytes, UA: NEGATIVE
Nitrite: NEGATIVE
PROTEIN: NEGATIVE
Specific Gravity, Urine: 1.026 (ref 1.001–1.03)
pH: <=5 (ref 5.0–8.0)

## 2019-02-02 LAB — C3 AND C4
C3 Complement: 165 mg/dL (ref 83–193)
C4 Complement: 24 mg/dL (ref 15–57)

## 2019-02-02 LAB — COMPLETE METABOLIC PANEL WITH GFR
AG Ratio: 1.2 (calc) (ref 1.0–2.5)
ALKALINE PHOSPHATASE (APISO): 106 U/L (ref 37–153)
ALT: 14 U/L (ref 6–29)
AST: 20 U/L (ref 10–35)
Albumin: 4.1 g/dL (ref 3.6–5.1)
BUN: 12 mg/dL (ref 7–25)
CO2: 31 mmol/L (ref 20–32)
Calcium: 9.5 mg/dL (ref 8.6–10.4)
Chloride: 100 mmol/L (ref 98–110)
Creat: 0.89 mg/dL (ref 0.50–1.05)
GFR, Est African American: 85 mL/min/{1.73_m2} (ref >=60)
GFR, Est Non African American: 73 mL/min/{1.73_m2} (ref >=60)
Globulin: 3.5 g/dL (calc) (ref 1.9–3.7)
Glucose, Bld: 260 mg/dL — ABNORMAL HIGH (ref 65–99)
Potassium: 4.1 mmol/L (ref 3.5–5.3)
Sodium: 139 mmol/L (ref 135–146)
Total Bilirubin: 0.6 mg/dL (ref 0.2–1.2)
Total Protein: 7.6 g/dL (ref 6.1–8.1)

## 2019-02-02 LAB — PROTEIN ELECTROPHORESIS, SERUM, WITH REFLEX
Albumin ELP: 4.1 g/dL (ref 3.8–4.8)
Alpha 1: 0.3 g/dL (ref 0.2–0.3)
Alpha 2: 0.8 g/dL (ref 0.5–0.9)
Beta 2: 0.5 g/dL (ref 0.2–0.5)
Beta Globulin: 0.5 g/dL (ref 0.4–0.6)
Gamma Globulin: 1.5 g/dL (ref 0.8–1.7)
Total Protein: 7.6 g/dL (ref 6.1–8.1)

## 2019-02-02 LAB — ANTI-DNA ANTIBODY, DOUBLE-STRANDED: ds DNA Ab: <1 IU/mL

## 2019-02-02 LAB — ANTI-NUCLEAR AB-TITER (ANA TITER): ANA Titer 1: 1:320 {titer} — ABNORMAL HIGH

## 2019-02-02 LAB — VITAMIN D 25 HYDROXY (VIT D DEFICIENCY, FRACTURES): Vit D, 25-Hydroxy: 15 ng/mL — ABNORMAL LOW (ref 30–100)

## 2019-02-02 LAB — RHEUMATOID FACTOR: Rheumatoid fact SerPl-aCnc: 14 IU/mL — ABNORMAL HIGH (ref <14)

## 2019-02-02 LAB — SEDIMENTATION RATE: Sed Rate: 29 mm/h (ref 0–30)

## 2019-02-05 ENCOUNTER — Telehealth: Payer: Self-pay | Admitting: Rheumatology

## 2019-02-05 DIAGNOSIS — E559 Vitamin D deficiency, unspecified: Secondary | ICD-10-CM

## 2019-02-05 MED ORDER — VITAMIN D (ERGOCALCIFEROL) 1.25 MG (50000 UNIT) PO CAPS: 50000.0000 [IU] | ORAL_CAPSULE | ORAL | 0 refills | Status: DC

## 2019-02-05 NOTE — Telephone Encounter (Signed)
Patient called stating she was returning your call.   °

## 2019-02-05 NOTE — Progress Notes (Signed)
ANA positive. Sed rate WNL. DsDNA <1.  Complements WNL.  SPEP normal.

## 2019-02-05 NOTE — Telephone Encounter (Signed)
-----   Message from Ofilia Neas, PA-C sent at 02/05/2019  8:11 AM EST ----- ANA positive. Sed rate WNL. DsDNA <1.  Complements WNL.  SPEP normal.

## 2019-02-12 MED ORDER — ONETOUCH VERIO FLEX SYSTEM W/DEVICE KIT: PACK | 0 refills | Status: AC

## 2019-02-12 MED ORDER — GLUCOSE BLOOD VI STRP: ORAL_STRIP | 2 refills | Status: DC

## 2019-03-01 NOTE — Telephone Encounter (Signed)
Rosaria Ferries or [place], can you look into these referrals? Thanks!

## 2019-03-02 ENCOUNTER — Encounter: Payer: Self-pay | Admitting: Emergency Medicine

## 2019-03-02 ENCOUNTER — Emergency Department: Payer: BLUE CROSS/BLUE SHIELD

## 2019-03-02 ENCOUNTER — Other Ambulatory Visit: Payer: Self-pay

## 2019-03-02 ENCOUNTER — Observation Stay: Payer: BLUE CROSS/BLUE SHIELD

## 2019-03-02 ENCOUNTER — Inpatient Hospital Stay
Admission: EM | Admit: 2019-03-02 | Discharge: 2019-03-06 | DRG: 547 | Disposition: A | Discharge status: Home Health | Payer: BLUE CROSS/BLUE SHIELD | Attending: Internal Medicine | Admitting: Internal Medicine

## 2019-03-02 DIAGNOSIS — R202 Paresthesia of skin: Secondary | ICD-10-CM | POA: Diagnosis present

## 2019-03-02 DIAGNOSIS — G8929 Other chronic pain: Secondary | ICD-10-CM | POA: Diagnosis present

## 2019-03-02 DIAGNOSIS — Z7982 Long term (current) use of aspirin: Secondary | ICD-10-CM

## 2019-03-02 DIAGNOSIS — E119 Type 2 diabetes mellitus without complications: Secondary | ICD-10-CM

## 2019-03-02 DIAGNOSIS — I10 Essential (primary) hypertension: Secondary | ICD-10-CM | POA: Diagnosis present

## 2019-03-02 DIAGNOSIS — Z800 Family history of malignant neoplasm of digestive organs: Secondary | ICD-10-CM

## 2019-03-02 DIAGNOSIS — Z881 Allergy status to other antibiotic agents status: Secondary | ICD-10-CM

## 2019-03-02 DIAGNOSIS — E785 Hyperlipidemia, unspecified: Secondary | ICD-10-CM | POA: Diagnosis present

## 2019-03-02 DIAGNOSIS — I639 Cerebral infarction, unspecified: Secondary | ICD-10-CM

## 2019-03-02 DIAGNOSIS — Z91048 Other nonmedicinal substance allergy status: Secondary | ICD-10-CM

## 2019-03-02 DIAGNOSIS — Z823 Family history of stroke: Secondary | ICD-10-CM

## 2019-03-02 DIAGNOSIS — F419 Anxiety disorder, unspecified: Secondary | ICD-10-CM | POA: Diagnosis present

## 2019-03-02 DIAGNOSIS — M549 Dorsalgia, unspecified: Secondary | ICD-10-CM | POA: Diagnosis present

## 2019-03-02 DIAGNOSIS — IMO0001 Reserved for inherently not codable concepts without codable children: Secondary | ICD-10-CM | POA: Diagnosis present

## 2019-03-02 DIAGNOSIS — I7300 Raynaud's syndrome without gangrene: Secondary | ICD-10-CM | POA: Diagnosis present

## 2019-03-02 DIAGNOSIS — F329 Major depressive disorder, single episode, unspecified: Secondary | ICD-10-CM | POA: Diagnosis present

## 2019-03-02 DIAGNOSIS — M3219 Other organ or system involvement in systemic lupus erythematosus: Principal | ICD-10-CM | POA: Diagnosis present

## 2019-03-02 DIAGNOSIS — Z87891 Personal history of nicotine dependence: Secondary | ICD-10-CM

## 2019-03-02 DIAGNOSIS — M3500 Sicca syndrome, unspecified: Secondary | ICD-10-CM | POA: Diagnosis present

## 2019-03-02 DIAGNOSIS — I340 Nonrheumatic mitral (valve) insufficiency: Secondary | ICD-10-CM | POA: Diagnosis present

## 2019-03-02 DIAGNOSIS — Z803 Family history of malignant neoplasm of breast: Secondary | ICD-10-CM

## 2019-03-02 DIAGNOSIS — M797 Fibromyalgia: Secondary | ICD-10-CM | POA: Diagnosis present

## 2019-03-02 DIAGNOSIS — M069 Rheumatoid arthritis, unspecified: Secondary | ICD-10-CM | POA: Diagnosis present

## 2019-03-02 DIAGNOSIS — R209 Unspecified disturbances of skin sensation: Secondary | ICD-10-CM

## 2019-03-02 DIAGNOSIS — Z8673 Personal history of transient ischemic attack (TIA), and cerebral infarction without residual deficits: Secondary | ICD-10-CM

## 2019-03-02 DIAGNOSIS — Z981 Arthrodesis status: Secondary | ICD-10-CM

## 2019-03-02 DIAGNOSIS — Z888 Allergy status to other drugs, medicaments and biological substances status: Secondary | ICD-10-CM

## 2019-03-02 DIAGNOSIS — E1165 Type 2 diabetes mellitus with hyperglycemia: Secondary | ICD-10-CM | POA: Diagnosis present

## 2019-03-02 DIAGNOSIS — Z8249 Family history of ischemic heart disease and other diseases of the circulatory system: Secondary | ICD-10-CM

## 2019-03-02 DIAGNOSIS — R29810 Facial weakness: Secondary | ICD-10-CM | POA: Diagnosis present

## 2019-03-02 LAB — COMPREHENSIVE METABOLIC PANEL
ALK PHOS: 101 U/L (ref 38–126)
ALT: 17 U/L (ref 0–44)
AST: 26 U/L (ref 15–41)
Albumin: 4.4 g/dL (ref 3.5–5.0)
Anion gap: 10 (ref 5–15)
BUN: 9 mg/dL (ref 6–20)
CO2: 28 mmol/L (ref 22–32)
CREATININE: 0.63 mg/dL (ref 0.44–1.00)
Calcium: 9.7 mg/dL (ref 8.9–10.3)
Chloride: 97 mmol/L — ABNORMAL LOW (ref 98–111)
GFR calc Af Amer: >60 mL/min (ref >60)
GFR calc non Af Amer: >60 mL/min (ref >60)
Glucose, Bld: 230 mg/dL — ABNORMAL HIGH (ref 70–99)
Potassium: 3.5 mmol/L (ref 3.5–5.1)
Sodium: 135 mmol/L (ref 135–145)
Total Bilirubin: 1 mg/dL (ref 0.3–1.2)
Total Protein: 8.9 g/dL — ABNORMAL HIGH (ref 6.5–8.1)

## 2019-03-02 LAB — DIFFERENTIAL
Abs Immature Granulocytes: 0.01 10*3/uL (ref 0.00–0.07)
Basophils Absolute: 0.1 10*3/uL (ref 0.0–0.1)
Basophils Relative: 1 %
Eosinophils Absolute: 0.1 10*3/uL (ref 0.0–0.5)
Eosinophils Relative: 2 %
Immature Granulocytes: 0 %
LYMPHS PCT: 34 %
Lymphs Abs: 2.2 10*3/uL (ref 0.7–4.0)
Monocytes Absolute: 0.4 10*3/uL (ref 0.1–1.0)
Monocytes Relative: 6 %
NEUTROS ABS: 3.7 10*3/uL (ref 1.7–7.7)
Neutrophils Relative %: 57 %

## 2019-03-02 LAB — CBC
HCT: 42.1 % (ref 36.0–46.0)
Hemoglobin: 13.8 g/dL (ref 12.0–15.0)
MCH: 26.8 pg (ref 26.0–34.0)
MCHC: 32.8 g/dL (ref 30.0–36.0)
MCV: 81.9 fL (ref 80.0–100.0)
Platelets: 365 10*3/uL (ref 150–400)
RBC: 5.14 MIL/uL — ABNORMAL HIGH (ref 3.87–5.11)
RDW: 13 % (ref 11.5–15.5)
WBC: 6.5 10*3/uL (ref 4.0–10.5)
nRBC: 0 % (ref 0.0–0.2)

## 2019-03-02 LAB — APTT: aPTT: 27 seconds (ref 24–36)

## 2019-03-02 LAB — PROTIME-INR
INR: 1 (ref 0.8–1.2)
Prothrombin Time: 12.8 seconds (ref 11.4–15.2)

## 2019-03-02 LAB — GLUCOSE, CAPILLARY: Glucose-Capillary: 217 mg/dL — ABNORMAL HIGH (ref 70–99)

## 2019-03-02 MED ORDER — IRBESARTAN 150 MG PO TABS
300.0000 mg | ORAL_TABLET | Freq: Every day | ORAL | Status: DC
  Administered 2019-03-03 – 2019-03-05 (×3): 300 mg via ORAL
  Filled 2019-03-02 (×3): qty 2

## 2019-03-02 MED ORDER — ACETAMINOPHEN 160 MG/5ML PO SOLN
650.0000 mg | ORAL | Status: DC | PRN
  Filled 2019-03-02: qty 20.3

## 2019-03-02 MED ORDER — CYCLOBENZAPRINE HCL 10 MG PO TABS: 5.0000 mg | ORAL_TABLET | Freq: Three times a day (TID) | ORAL | Status: DC | PRN

## 2019-03-02 MED ORDER — ONDANSETRON HCL 4 MG/2ML IJ SOLN
4.0000 mg | Freq: Four times a day (QID) | INTRAMUSCULAR | Status: DC | PRN
  Administered 2019-03-02: 4 mg via INTRAVENOUS
  Filled 2019-03-02: qty 2

## 2019-03-02 MED ORDER — VITAMIN D (ERGOCALCIFEROL) 1.25 MG (50000 UNIT) PO CAPS
50000.0000 [IU] | ORAL_CAPSULE | ORAL | Status: DC
  Administered 2019-03-05: 50000 [IU] via ORAL
  Filled 2019-03-02: qty 1

## 2019-03-02 MED ORDER — ROSUVASTATIN CALCIUM 10 MG PO TABS
10.0000 mg | ORAL_TABLET | Freq: Every day | ORAL | Status: DC
  Administered 2019-03-02 – 2019-03-05 (×4): 10 mg via ORAL
  Filled 2019-03-02 (×4): qty 1

## 2019-03-02 MED ORDER — ASPIRIN 81 MG PO CHEW
324.0000 mg | CHEWABLE_TABLET | Freq: Once | ORAL | Status: AC
  Administered 2019-03-02: 324 mg via ORAL
  Filled 2019-03-02: qty 4

## 2019-03-02 MED ORDER — CLOPIDOGREL BISULFATE 75 MG PO TABS
75.0000 mg | ORAL_TABLET | Freq: Every day | ORAL | Status: DC
  Administered 2019-03-02 – 2019-03-06 (×5): 75 mg via ORAL
  Filled 2019-03-02 (×5): qty 1

## 2019-03-02 MED ORDER — ESCITALOPRAM OXALATE 10 MG PO TABS
20.0000 mg | ORAL_TABLET | Freq: Every day | ORAL | Status: DC
  Administered 2019-03-02 – 2019-03-05 (×4): 20 mg via ORAL
  Filled 2019-03-02 (×5): qty 2

## 2019-03-02 MED ORDER — HYDROXYCHLOROQUINE SULFATE 200 MG PO TABS
200.0000 mg | ORAL_TABLET | Freq: Two times a day (BID) | ORAL | Status: DC
  Administered 2019-03-02 – 2019-03-06 (×8): 200 mg via ORAL
  Filled 2019-03-02 (×9): qty 1

## 2019-03-02 MED ORDER — GABAPENTIN 300 MG PO CAPS
300.0000 mg | ORAL_CAPSULE | Freq: Three times a day (TID) | ORAL | Status: DC | PRN
  Administered 2019-03-03 (×2): 300 mg via ORAL
  Filled 2019-03-02 (×2): qty 1

## 2019-03-02 MED ORDER — METOPROLOL TARTRATE 50 MG PO TABS
100.0000 mg | ORAL_TABLET | Freq: Two times a day (BID) | ORAL | Status: DC
  Administered 2019-03-03 (×2): 100 mg via ORAL
  Filled 2019-03-02 (×2): qty 2

## 2019-03-02 MED ORDER — STROKE: EARLY STAGES OF RECOVERY BOOK
Freq: Once | Status: AC
  Administered 2019-03-02: 19:00:00

## 2019-03-02 MED ORDER — ACETAMINOPHEN 650 MG RE SUPP: 650.0000 mg | RECTAL | Status: DC | PRN

## 2019-03-02 MED ORDER — CLOBETASOL PROPIONATE 0.05 % EX CREA
1.0000 "application " | TOPICAL_CREAM | Freq: Two times a day (BID) | CUTANEOUS | Status: DC | PRN
  Filled 2019-03-02: qty 15

## 2019-03-02 MED ORDER — ENOXAPARIN SODIUM 40 MG/0.4ML ~~LOC~~ SOLN
40.0000 mg | SUBCUTANEOUS | Status: DC
  Administered 2019-03-02 – 2019-03-05 (×4): 40 mg via SUBCUTANEOUS
  Filled 2019-03-02 (×4): qty 0.4

## 2019-03-02 MED ORDER — BUPROPION HCL ER (XL) 150 MG PO TB24
150.0000 mg | ORAL_TABLET | Freq: Every day | ORAL | Status: DC
  Administered 2019-03-02 – 2019-03-05 (×4): 150 mg via ORAL
  Filled 2019-03-02 (×4): qty 1

## 2019-03-02 MED ORDER — SPIRONOLACTONE 25 MG PO TABS
25.0000 mg | ORAL_TABLET | Freq: Every day | ORAL | Status: DC
  Administered 2019-03-03 – 2019-03-06 (×4): 25 mg via ORAL
  Filled 2019-03-02 (×4): qty 1

## 2019-03-02 MED ORDER — FOLIC ACID 1 MG PO TABS
2.0000 mg | ORAL_TABLET | Freq: Every day | ORAL | Status: DC
  Administered 2019-03-03 – 2019-03-06 (×4): 2 mg via ORAL
  Filled 2019-03-02 (×4): qty 2

## 2019-03-02 MED ORDER — INSULIN GLARGINE 100 UNIT/ML ~~LOC~~ SOLN
12.0000 [IU] | Freq: Every evening | SUBCUTANEOUS | Status: DC
  Administered 2019-03-02 – 2019-03-04 (×3): 12 [IU] via SUBCUTANEOUS
  Filled 2019-03-02 (×4): qty 0.12

## 2019-03-02 MED ORDER — SENNOSIDES-DOCUSATE SODIUM 8.6-50 MG PO TABS: 1.0000 | ORAL_TABLET | Freq: Every evening | ORAL | Status: DC | PRN

## 2019-03-02 MED ORDER — METHOTREXATE 2.5 MG PO TABS
20.0000 mg | ORAL_TABLET | ORAL | Status: DC
  Administered 2019-03-02: 20 mg via ORAL
  Filled 2019-03-02: qty 8

## 2019-03-02 MED ORDER — VALSARTAN-HYDROCHLOROTHIAZIDE 320-25 MG PO TABS: 1.0000 | ORAL_TABLET | Freq: Every day | ORAL | Status: DC

## 2019-03-02 MED ORDER — ACETAMINOPHEN 325 MG PO TABS
650.0000 mg | ORAL_TABLET | ORAL | Status: DC | PRN
  Administered 2019-03-03 – 2019-03-06 (×3): 650 mg via ORAL
  Filled 2019-03-02 (×3): qty 2

## 2019-03-02 MED ORDER — HYDROCHLOROTHIAZIDE 25 MG PO TABS
25.0000 mg | ORAL_TABLET | Freq: Every day | ORAL | Status: DC
  Administered 2019-03-03 – 2019-03-05 (×3): 25 mg via ORAL
  Filled 2019-03-02 (×3): qty 1

## 2019-03-02 MED ORDER — AMLODIPINE BESYLATE 10 MG PO TABS
10.0000 mg | ORAL_TABLET | Freq: Every day | ORAL | Status: DC
  Administered 2019-03-03 – 2019-03-06 (×4): 10 mg via ORAL
  Filled 2019-03-02 (×4): qty 1

## 2019-03-02 MED ORDER — TACROLIMUS 0.1 % EX OINT
1.0000 "application " | TOPICAL_OINTMENT | Freq: Two times a day (BID) | CUTANEOUS | Status: DC | PRN
  Filled 2019-03-02: qty 1

## 2019-03-02 NOTE — ED Triage Notes (Signed)
Left side body tingling. Says left face feels tingly and tight.  Has had episodes this week with tingling in left hand.  Last know normal was 1130p last night.  She says she woke at 4 am and noticed the change.  Has left side facil droop.

## 2019-03-02 NOTE — ED Notes (Addendum)
ED TO INPATIENT HANDOFF REPORT  ED Nurse Name and Phone #: Karena Addison [phone]  S Name/Age/Gender Lauren Winters 53 y.o. female Room/Bed: ED30A/ED30A  Code Status   Code Status: Prior  Home/SNF/Other Home Patient oriented to: self, place, time and situation Is this baseline? Yes   Triage Complete: Triage complete  Chief Complaint Stoke Sx   Triage Note Left side body tingling. Says left face feels tingly and tight.  Has had episodes this week with tingling in left hand.  Last know normal was 1130p last night.  She says she woke at 4 am and noticed the change.  Has left side facil droop.   Allergies Allergies  Allergen Reactions  . Pseudoephedrine Hcl Shortness Of Breath and Other (See Comments)    Room starts spinning, can't breath  . Ancef [Cefazolin] Other (See Comments)    Unknown  . Clindamycin/Lincomycin Hives  . Prednisone Other (See Comments)    Chest pain w/ cortisone shots or prednisone pills Elevated blood pressure  . Adhesive [Tape] Rash and Other (See Comments)    Sensitivity to certain band aids and adhesives - burns skin, makes skin raw  . Other Rash and Other (See Comments)    Sensitivity to certain band aids and adhesives - burns skin, makes skin raw  . Sudafed [Pseudoephedrine Hcl] Other (See Comments)    Dizziness     Level of Care/Admitting Diagnosis ED Disposition    ED Disposition Condition [facility] Area: [facility] [100120]  Level of Care: Med-Surg [16]  Diagnosis: Paresthesias [220254]  Admitting Physician: Gorden Harms [[id number]]  Attending Physician: Gorden Harms [[id number]]  PT Class (Do Not Modify): Observation [104]  PT Acc Code (Do Not Modify): Observation [10022]       B Medical/Surgery History Past Medical History:  Diagnosis Date  . Anxiety   . Atypical chest pain    a. ETT 05/2014: normal stress test without evidence of myocardial ischemia or chest pain at peak stress  . Complication  of anesthesia    migraine headache  . Depression   . Diabetes mellitus without complication ([place]) metformin   dx 2015  . Dyspnea    on exertion  . Fibromyalgia   . Headache   . Heart murmur    told back in 2007, but no mention with 2016 cardiac w/u  . Hypertension   . Lupus (systemic lupus erythematosus) ([place])   . Mitral regurgitation    a. 05/2014 echo: EF >55%, nl LV/RV sys fxn, mild MR/AR, no valvular stenosis   . Obesity (BMI 30-39.9)   . Raynaud's phenomenon   . Rheumatoid arthritis ([place])   . Sjogren's disease ([place])   . Spondylolisthesis of lumbar region   . Tobacco abuse    a. ongoing; b. ongoing SOB   Past Surgical History:  Procedure Laterality Date  . ABDOMINAL HYSTERECTOMY    . BACK SURGERY    . CESAREAN SECTION     x3  . LUMBAR LAMINECTOMY    . LUMBAR LAMINECTOMY/DECOMPRESSION MICRODISCECTOMY N/A 03/29/2017   Procedure: Re operative Right Lumbar Three-Four Laminectomy and discectomy with Left Lumbar Three-Four, Bilateral Lumbar Four-Five Laminectomy for decompression;  Surgeon: Kevan Ny Ditty, MD;  Location: [facility];  Service: Neurosurgery;  Laterality: N/A;  . SPINAL CORD STIMULATOR INSERTION  12/09/2017   Procedure: LUMBAR SPINAL CORD STIMULATOR IMPLANT;  Surgeon: Eustace Moore, MD;  Location: [facility];  Service: Neurosurgery;;  . SPINAL CORD STIMULATOR REMOVAL N/A 01/11/2018  Procedure: LUMBAR SPINAL CORD STIMULATOR REMOVAL;  Surgeon: Eustace Moore, MD;  Location: [facility];  Service: Neurosurgery;  Laterality: N/A;  . TUBAL LIGATION       A IV Location/Drains/Wounds Patient Lines/Drains/Airways Status   Active Line/Drains/Airways    Name:   Placement date:   Placement time:   Site:   Days:   Peripheral IV 03/02/19 Left Hand   03/02/19    1550    Hand   less than 1   Incision (Closed) 05/26/18 Back   05/26/18    1243     280          Intake/Output Last 24 hours No intake or output data in the 24 hours ending 03/02/19 1642  Labs/Imaging Results for  orders placed or performed during the hospital encounter of 03/02/19 (from the past 48 hour(s))  Glucose, capillary     Status: Abnormal   Collection Time: 03/02/19  1:56 PM  Result Value Ref Range   Glucose-Capillary 217 (H) 70 - 99 mg/dL  Protime-INR     Status: None   Collection Time: 03/02/19  2:04 PM  Result Value Ref Range   Prothrombin Time 12.8 11.4 - 15.2 seconds   INR 1.0 0.8 - 1.2    Comment: (NOTE) INR goal varies based on device and disease states. Performed at [facility], [address] [place], [place] [address]   APTT     Status: None   Collection Time: 03/02/19  2:04 PM  Result Value Ref Range   aPTT 27 24 - 36 seconds    Comment: Performed at [facility], [address] [place], [place] [address]  CBC     Status: Abnormal   Collection Time: 03/02/19  2:04 PM  Result Value Ref Range   WBC 6.5 4.0 - 10.5 K/uL   RBC 5.14 (H) 3.87 - 5.11 MIL/uL   Hemoglobin 13.8 12.0 - 15.0 g/dL   HCT 42.1 36.0 - 46.0 %   MCV 81.9 80.0 - 100.0 fL   MCH 26.8 26.0 - 34.0 pg   MCHC 32.8 30.0 - 36.0 g/dL   RDW 13.0 11.5 - 15.5 %   Platelets 365 150 - 400 K/uL   nRBC 0.0 0.0 - 0.2 %    Comment: Performed at [facility], [address] [place], [place] [address]  Differential     Status: None   Collection Time: 03/02/19  2:04 PM  Result Value Ref Range   Neutrophils Relative % 57 %   Neutro Abs 3.7 1.7 - 7.7 K/uL   Lymphocytes Relative 34 %   Lymphs Abs 2.2 0.7 - 4.0 K/uL   Monocytes Relative 6 %   Monocytes Absolute 0.4 0.1 - 1.0 K/uL   Eosinophils Relative 2 %   Eosinophils Absolute 0.1 0.0 - 0.5 K/uL   Basophils Relative 1 %   Basophils Absolute 0.1 0.0 - 0.1 K/uL   Immature Granulocytes 0 %   Abs Immature Granulocytes 0.01 0.00 - 0.07 K/uL    Comment: Performed at [facility], [address] [place], [place] [address]  Comprehensive metabolic panel     Status: Abnormal   Collection Time: 03/02/19  2:04 PM  Result Value  Ref Range   Sodium 135 135 - 145 mmol/L   Potassium 3.5 3.5 - 5.1 mmol/L   Chloride 97 (L) 98 - 111 mmol/L   CO2 28 22 - 32 mmol/L   Glucose, Bld 230 (H) 70 - 99 mg/dL  BUN 9 6 - 20 mg/dL   Creatinine, Ser 0.63 0.44 - 1.00 mg/dL   Calcium 9.7 8.9 - 10.3 mg/dL   Total Protein 8.9 (H) 6.5 - 8.1 g/dL   Albumin 4.4 3.5 - 5.0 g/dL   AST 26 15 - 41 U/L   ALT 17 0 - 44 U/L   Alkaline Phosphatase 101 38 - 126 U/L   Total Bilirubin 1.0 0.3 - 1.2 mg/dL   GFR calc non Af Amer >60 >60 mL/min   GFR calc Af Amer >60 >60 mL/min   Anion gap 10 5 - 15    Comment: Performed at [facility], [address], [place], [place] [address]   Ct Head Wo Contrast  Result Date: 03/02/2019 CLINICAL DATA:  Left-sided tingling and left facial droop. EXAM: CT HEAD WITHOUT CONTRAST TECHNIQUE: Contiguous axial images were obtained from the base of the skull through the vertex without intravenous contrast. COMPARISON:  Head CT 08/19/2018 FINDINGS: Brain: There is no mass, hemorrhage or extra-axial collection. The size and configuration of the ventricles and extra-axial CSF spaces are normal. There is hypoattenuation of the white matter, most commonly indicating chronic small vessel disease. Vascular: No abnormal hyperdensity of the major intracranial arteries or dural venous sinuses. No intracranial atherosclerosis. Skull: The visualized skull base, calvarium and extracranial soft tissues are normal. Sinuses/Orbits: No fluid levels or advanced mucosal thickening of the visualized paranasal sinuses. No mastoid or middle ear effusion. The orbits are normal. IMPRESSION: 1. No acute intracranial abnormality. 2. Findings of chronic small vessel disease. Electronically Signed   By: Ulyses Jarred M.D.   On: 03/02/2019 14:47    Pending Labs Unresulted Labs (From admission, onward)    Start     Ordered   03/03/19 0500  Lipid panel  Tomorrow morning,   STAT     03/02/19 1608   Signed and Held  HIV antibody (Routine  Testing)  Once,   R     Signed and Held   Signed and Held  Hemoglobin A1c  Tomorrow morning,   R     Signed and Held   Signed and Held  Lipid panel  Tomorrow morning,   R    Comments:  Fasting    Signed and Held   Signed and Held  CBC  (enoxaparin (LOVENOX)    CrCl >/= 30 ml/min)  Once,   R    Comments:  Baseline for enoxaparin therapy IF NOT ALREADY DRAWN.  Notify MD if PLT < 100 K.    Signed and Held   Signed and Held  Creatinine, serum  (enoxaparin (LOVENOX)    CrCl >/= 30 ml/min)  Once,   R    Comments:  Baseline for enoxaparin therapy IF NOT ALREADY DRAWN.    Signed and Held   Signed and Held  Creatinine, serum  (enoxaparin (LOVENOX)    CrCl >/= 30 ml/min)  Weekly,   R    Comments:  while on enoxaparin therapy    Signed and Held          Vitals/Pain Today's Vitals   03/02/19 1350 03/02/19 1639  BP:  136/68  Pulse:  61  Resp:  18  SpO2:  100%  Weight: 99.3 kg   Height: 5\' 6"  (1.676 m)   PainSc: 8      Isolation Precautions No active isolations  Medications Medications  aspirin chewable tablet 324 mg (324 mg Oral Given 03/02/19 1504)    Mobility walks  Focused Assessments Neuro Assessment Handoff:  Swallow screen pass? Yes    NIH Stroke Scale ( + Modified Stroke Scale Criteria)  Interval: Initial Level of Consciousness (1a.)   : Alert, keenly responsive LOC Questions (1b. )   +: Answers both questions correctly LOC Commands (1c. )   + : Performs both tasks correctly Best Gaze (2. )  +: Normal Visual (3. )  +: No visual loss Facial Palsy (4. )    : Normal symmetrical movements Motor Arm, Left (5a. )   +: No drift Motor Arm, Right (5b. )   +: No drift Motor Leg, Left (6a. )   +: No drift Motor Leg, Right (6b. )   +: No drift Limb Ataxia (7. ): Absent Sensory (8. )   +: Normal, no sensory loss Best Language (9. )   +: No aphasia Dysarthria (10. ): Normal Extinction/Inattention (11.)   +: No Abnormality Modified SS Total  +: 0 Complete NIHSS  TOTAL: 0     Neuro Assessment: Within Defined Limits Neuro Checks:   Initial (03/02/19 1504)  Last Documented NIHSS Modified Score: 0 (03/02/19 1504) Has TPA been given? No If patient is a Neuro Trauma and patient is going to OR before floor call report to [facility] nurse: [phone] or [phone]     R Recommendations: See Admitting Provider Note  Report given to: Altha Harm, RN

## 2019-03-02 NOTE — Progress Notes (Signed)
Family Meeting Note  Advance Directive:yes  Today a meeting took place with the Patient.  Patient is able to participate   The following clinical team members were present during this meeting:MD  The following were discussed:Patient's diagnosis: Paresthesias, left facial droop, history of CVA, Patient's progosis: Unable to determine and Goals for treatment: Full Code  Additional follow-up to be provided: prn  Time spent during discussion:20 minutes  Gorden Harms, MD

## 2019-03-02 NOTE — ED Provider Notes (Signed)
[facility] Emergency Department Provider Note  ____________________________________________  Time seen: Approximately 3:32 PM  I have reviewed the triage vital signs and the nursing notes.   HISTORY  Chief Complaint Numbness    HPI Lauren Winters is a 53 y.o. female with a history of hypertension diabetes and previous thalamic stroke causing left-sided paresthesias who comes the ED complaining of paresthesia in the left lower face, left arm, and left leg.  First noticed symptoms when she woke up this morning, constant without aggravating or alleviating factors since then.  Last known well was 11:30 PM last night.  She reports that at baseline from prior back surgeries and the previous stroke that was in August 2019, she normally has tingling in her bilateral feet and in her left hand.  However, today she has a tingling sensation diffusely in the left arm and leg as well as the left face.  She also feels that her vision is blurry on the left.  Denies any trouble with word finding or speech.  No trauma.  No fevers chills or neck pain.  Takes a baby aspirin daily, but no Plavix or anticoagulants.        Past Medical History:  Diagnosis Date  . Anxiety   . Atypical chest pain    a. ETT 05/2014: normal stress test without evidence of myocardial ischemia or chest pain at peak stress  . Complication of anesthesia    migraine headache  . Depression   . Diabetes mellitus without complication ([place]) metformin   dx 2015  . Dyspnea    on exertion  . Fibromyalgia   . Headache   . Heart murmur    told back in 2007, but no mention with 2016 cardiac w/u  . Hypertension   . Lupus (systemic lupus erythematosus) ([place])   . Mitral regurgitation    a. 05/2014 echo: EF >55%, nl LV/RV sys fxn, mild MR/AR, no valvular stenosis   . Obesity (BMI 30-39.9)   . Raynaud's phenomenon   . Rheumatoid arthritis ([place])   . Sjogren's disease ([place])   . Spondylolisthesis of lumbar  region   . Tobacco abuse    a. ongoing; b. ongoing SOB     Patient Active Problem List   Diagnosis Date Noted  . CVA (cerebral vascular accident) ([place]) 08/19/2018  . S/P lumbar spinal fusion 05/26/2018  . High risk medication use 02/03/2018  . Wound infection after surgery 01/11/2018  . S/P lumbar laminectomy 12/09/2017  . Paresthesia 05/11/2017  . HNP (herniated nucleus pulposus), lumbar 03/29/2017  . Chronic right shoulder pain 02/18/2017  . Primary osteoarthritis of both hands 12/22/2016  . Primary osteoarthritis of both knees 12/22/2016  . Spondylosis of lumbar region without myelopathy or radiculopathy 12/22/2016  . Fibromyalgia 12/22/2016  . Preventative health care 04/19/2016  . Chronic back pain 12/17/2015  . Hyperlipidemia 08/29/2015  . Other organ or system involvement in systemic lupus erythematosus ([place]) 08/04/2015  . Rheumatoid factor positive 08/04/2015  . Sjogren's syndrome ([place]) 08/04/2015  . Mitral regurgitation   . Tobacco abuse   . Atypical chest pain   . Obesity (BMI 30-39.9)   . Anxiety   . Hypertension   . Diabetes mellitus without complication ([place])   . Clinical depression 05/23/2014     Past Surgical History:  Procedure Laterality Date  . ABDOMINAL HYSTERECTOMY    . BACK SURGERY    . CESAREAN SECTION     x3  . LUMBAR LAMINECTOMY    .  LUMBAR LAMINECTOMY/DECOMPRESSION MICRODISCECTOMY N/A 03/29/2017   Procedure: Re operative Right Lumbar Three-Four Laminectomy and discectomy with Left Lumbar Three-Four, Bilateral Lumbar Four-Five Laminectomy for decompression;  Surgeon: Kevan Ny Ditty, MD;  Location: [facility];  Service: Neurosurgery;  Laterality: N/A;  . SPINAL CORD STIMULATOR INSERTION  12/09/2017   Procedure: LUMBAR SPINAL CORD STIMULATOR IMPLANT;  Surgeon: Eustace Moore, MD;  Location: [facility];  Service: Neurosurgery;;  . SPINAL CORD STIMULATOR REMOVAL N/A 01/11/2018   Procedure: LUMBAR SPINAL CORD STIMULATOR REMOVAL;  Surgeon: Eustace Moore,  MD;  Location: [facility];  Service: Neurosurgery;  Laterality: N/A;  . TUBAL LIGATION       Prior to Admission medications   Medication Sig Start Date End Date Taking? Authorizing Provider  amLODipine (NORVASC) 10 MG tablet Take 1 tablet once daily for blood pressure. Patient taking differently: Take 10 mg by mouth daily.  04/25/18   Pleas Koch, NP  Blood Glucose Monitoring Suppl (ONETOUCH VERIO FLEX SYSTEM) w/Device KIT Use as instructed to test blood sugar 3 times daily 02/12/19   Pleas Koch, NP  buPROPion (WELLBUTRIN XL) 150 MG 24 hr tablet Take 1 tablet by mouth once daily for anxiety and depression. Patient taking differently: Take 150 mg by mouth daily.  04/25/18   Pleas Koch, NP  clobetasol cream (TEMOVATE) 1.61 % Apply 1 application topically 2 (two) times daily as needed (for lupis rash).    [provider]  cyclobenzaprine (FLEXERIL) 10 MG tablet Take 1 tablet (10 mg total) by mouth 3 (three) times daily as needed for muscle spasms. 08/01/18   Pleas Koch, NP  escitalopram (LEXAPRO) 20 MG tablet TAKE 1 TABLET BY MOUTH EVERY DAY 12/15/18   Pleas Koch, NP  folic acid (FOLVITE) 1 MG tablet Take 2 tablets by mouth daily. 01/31/19   Ofilia Neas, PA-C  gabapentin (NEURONTIN) 300 MG capsule Take 600 mg by mouth 3 (three) times daily as needed (for pain).  11/02/16   [provider]  glucose blood test strip Use as instructed to blood sugar 3 times daily 02/12/19   Pleas Koch, NP  Halcinonide 0.1 % OINT Apply 1 application topically 2 (two) times daily as needed (for lupis rash).     [provider]  hydroxychloroquine (PLAQUENIL) 200 MG tablet Take 1 tablet (200 mg total) by mouth 2 (two) times daily. 01/31/19   Ofilia Neas, PA-C  Insulin Glargine (LANTUS) 100 UNIT/ML Solostar Pen Inject 12 Units into the skin every evening. 08/22/18   Pleas Koch, NP  Insulin Pen Needle (PEN NEEDLES) 31G X 6 MM MISC Use with insulin as  directed. 04/25/18   Pleas Koch, NP  metFORMIN (GLUCOPHAGE-XR) 500 MG 24 hr tablet Take 2 tablets every morning with food for diabetes. Patient taking differently: Take 1,000 mg by mouth daily with breakfast.  04/25/18   Pleas Koch, NP  methotrexate (RHEUMATREX) 2.5 MG tablet Take 8 tablets (20 mg total) by mouth once a week. Caution:Chemotherapy. Protect from light. 01/31/19   Ofilia Neas, PA-C  metoprolol tartrate (LOPRESSOR) 100 MG tablet Take 1 tablet by mouth twice daily for blood pressure. Patient taking differently: Take 100 mg by mouth 2 (two) times daily. Take 1 tablet by mouth twice daily for blood pressure. 04/25/18   Pleas Koch, NP  pilocarpine (SALAGEN) 5 MG tablet Take 5 mg by mouth daily as needed (for dry mouth).     [provider]  rosuvastatin (  CRESTOR) 10 MG tablet Take 1 tablet (10 mg total) by mouth daily at 6 PM. Take 1 tablet by mouth at bedtime for cholesterol. 08/21/18   Gladstone Lighter, MD  spironolactone (ALDACTONE) 25 MG tablet Take 25 mg by mouth daily.    [provider]  tacrolimus (PROTOPIC) 0.1 % ointment Apply 1 application topically 2 (two) times daily as needed (for lupis rash).     [provider]  Triamcinolone Acetonide (TRIAMCINOLONE 0.1 % CREAM : EUCERIN) CREA Apply 1 application topically 2 (two) times daily as needed for rash or irritation.     [provider]  valsartan-hydrochlorothiazide (DIOVAN-HCT) 320-25 MG tablet Take 1 tablet by mouth once daily for blood pressure. Patient taking differently: Take 1 tablet by mouth at bedtime.  04/25/18   Pleas Koch, NP  Vitamin D, Ergocalciferol, (DRISDOL) 1.25 MG (50000 UT) CAPS capsule Take 1 capsule (50,000 Units total) by mouth 2 (two) times a week. 02/05/19   Bo Merino, MD     Allergies Pseudoephedrine hcl; Clindamycin/lincomycin; Ancef [cefazolin]; Prednisone; Adhesive [tape]; Other; and Sudafed [pseudoephedrine hcl]   Family  History  Problem Relation Age of Onset  . Hypertension Mother   . Valvular heart disease Mother   . CAD Father 3       CABG x 4  . Liver cancer Father 21       passed  . CAD Paternal Aunt        CABG  . Breast cancer Paternal Aunt   . CAD Paternal Aunt        CABG  . Breast cancer Paternal Aunt   . CAD Paternal Aunt        CABG  . Breast cancer Paternal Aunt   . CAD Paternal Aunt        CABG  . Breast cancer Paternal Aunt   . CAD Paternal Aunt        CABG  . Breast cancer Paternal Aunt   . CAD Paternal Uncle        MI s/p CABG  . Stroke Maternal Grandmother   . Stroke Maternal Grandfather   . CAD Maternal Grandfather     Social History Social History   Tobacco Use  . Smoking status: Former Smoker    Packs/day: 0.50    Years: 10.00    Pack years: 5.00    Types: Cigarettes    Last attempt to quit: 07/22/2015    Years since quitting: 3.6  . Smokeless tobacco: Never Used  Substance Use Topics  . Alcohol use: No    Alcohol/week: 0.0 standard drinks  . Drug use: No    Review of Systems  Constitutional:   No fever or chills.  ENT:   No sore throat. No rhinorrhea. Cardiovascular:   No chest pain or syncope. Respiratory:   No dyspnea or cough. Gastrointestinal:   Negative for abdominal pain, vomiting and diarrhea.  Musculoskeletal:   Negative for focal pain or swelling All other systems reviewed and are negative except as documented above in ROS and HPI.  ____________________________________________   PHYSICAL EXAM:  VITAL SIGNS: ED Triage Vitals [03/02/19 1350]  Enc Vitals Group     BP      Pulse      Resp      Temp      Temp src      SpO2      Weight 219 lb (99.3 kg)     Height 5' 6"  (1.676 m)  Head Circumference      Peak Flow      Pain Score 8     Pain Loc      Pain Edu?      Excl. in [place]?     Vital signs reviewed, nursing assessments reviewed.   Constitutional:   Alert and oriented. Non-toxic appearance. Eyes:   Conjunctivae are  normal. EOMI. PERRL. ENT      Head:   Normocephalic and atraumatic.      Nose:   No congestion/rhinnorhea.       Mouth/Throat:   MMM, no pharyngeal erythema. No peritonsillar mass.       Neck:   No meningismus. Full ROM. Hematological/Lymphatic/Immunilogical:   No cervical lymphadenopathy. Cardiovascular:   RRR. Symmetric bilateral radial and DP pulses.  No murmurs. Cap refill less than 2 seconds. Respiratory:   Normal respiratory effort without tachypnea/retractions. Breath sounds are clear and equal bilaterally. No wheezes/rales/rhonchi. Gastrointestinal:   Soft and nontender. Non distended. There is no CVA tenderness.  No rebound, rigidity, or guarding.  Musculoskeletal:   Normal range of motion in all extremities. No joint effusions.  No lower extremity tenderness.  No edema. Neurologic:   Normal speech and language.  Slight asymmetry of left corner of the mouth on smile. Other cranial nerves III through XII intact Right-sided motor function 5/5.  Left-sided motor function diffusely 4-/5. Diffuse left-sided paresthesia. Normal finger-to-nose.  No pronator drift.  Steady gait limited by left leg weakness. NIH stroke scale 4 Skin:    Skin is warm, dry and intact. No rash noted.  No petechiae, purpura, or bullae.  ____________________________________________    LABS (pertinent positives/negatives) (all labs ordered are listed, but only abnormal results are displayed) Labs Reviewed  CBC - Abnormal; Notable for the following components:      Result Value   RBC 5.14 (*)    All other components within normal limits  COMPREHENSIVE METABOLIC PANEL - Abnormal; Notable for the following components:   Chloride 97 (*)    Glucose, Bld 230 (*)    Total Protein 8.9 (*)    All other components within normal limits  GLUCOSE, CAPILLARY - Abnormal; Notable for the following components:   Glucose-Capillary 217 (*)    All other components within normal limits  PROTIME-INR  APTT  DIFFERENTIAL   CBG MONITORING, ED  I-STAT CREATININE, ED   ____________________________________________   EKG  Interpreted by me Normal sinus rhythm rate of 73, normal axis and intervals.  Poor R wave progression.  Normal ST segments.  Isolated T wave inversion in lead III which is nonspecific.  ____________________________________________    RADIOLOGY  Ct Head Wo Contrast  Result Date: 03/02/2019 CLINICAL DATA:  Left-sided tingling and left facial droop. EXAM: CT HEAD WITHOUT CONTRAST TECHNIQUE: Contiguous axial images were obtained from the base of the skull through the vertex without intravenous contrast. COMPARISON:  Head CT 08/19/2018 FINDINGS: Brain: There is no mass, hemorrhage or extra-axial collection. The size and configuration of the ventricles and extra-axial CSF spaces are normal. There is hypoattenuation of the white matter, most commonly indicating chronic small vessel disease. Vascular: No abnormal hyperdensity of the major intracranial arteries or dural venous sinuses. No intracranial atherosclerosis. Skull: The visualized skull base, calvarium and extracranial soft tissues are normal. Sinuses/Orbits: No fluid levels or advanced mucosal thickening of the visualized paranasal sinuses. No mastoid or middle ear effusion. The orbits are normal. IMPRESSION: 1. No acute intracranial abnormality. 2. Findings of chronic small vessel disease. Electronically Signed  By: Ulyses Jarred M.D.   On: 03/02/2019 14:47    ____________________________________________   PROCEDURES Procedures  ____________________________________________  DIFFERENTIAL DIAGNOSIS   Intracranial hemorrhage, acute extreme stroke, exacerbation of prior stroke, electrolyte abnormality  CLINICAL IMPRESSION / ASSESSMENT AND PLAN / ED COURSE  Medications ordered in the ED: Medications  aspirin chewable tablet 324 mg (324 mg Oral Given 03/02/19 1504)    Pertinent labs & imaging results that were available during my care  of the patient were reviewed by me and considered in my medical decision making (see chart for details).    Patient presents with paresthesia and weakness of the left side.  CT scan unremarkable.  Concern for acute ischemic stroke.  Will need to be hospitalized for further stroke work-up.  Full dose aspirin ordered.  Not a candidate for TPA or endovascular intervention.  Case discussed with hospitalist for further evaluation.   ____________________________________________   FINAL CLINICAL IMPRESSION(S) / ED DIAGNOSES    Final diagnoses:  Acute ischemic stroke [facility])     ED Discharge Orders    None      Portions of this note were generated with dragon dictation software. Dictation errors may occur despite best attempts at proofreading.   Carrie Mew, MD 03/02/19 [phone]

## 2019-03-02 NOTE — H&P (Signed)
[facility] NAME: Oakleigh Hesketh    MR#:  [id number]  DATE OF BIRTH:  May 31, 1966  DATE OF ADMISSION:  03/02/2019  PRIMARY CARE PHYSICIAN: Pleas Koch, NP   REQUESTING/REFERRING PHYSICIAN:   CHIEF COMPLAINT:   Chief Complaint  Patient presents with  . Numbness    HISTORY OF PRESENT ILLNESS: Yobana Culliton  is a 53 y.o. female with a known history per below, which includes history of CVA, presenting to the emergency room with left-sided face/hand/leg numbness/tingling, left facial droop, started around 4 AM this morning, in the emergency room work-up was unimpressive, glucose 230, patient evaluated the bedside, significant other present, patient in no apparent distress, resting comfortably in bed, patient is now been admitted for acute left facial droop with left-sided paresthesias involving face/hand.  PAST MEDICAL HISTORY:   Past Medical History:  Diagnosis Date  . Anxiety   . Atypical chest pain    a. ETT 05/2014: normal stress test without evidence of myocardial ischemia or chest pain at peak stress  . Complication of anesthesia    migraine headache  . Depression   . Diabetes mellitus without complication ([place]) metformin   dx 2015  . Dyspnea    on exertion  . Fibromyalgia   . Headache   . Heart murmur    told back in 2007, but no mention with 2016 cardiac w/u  . Hypertension   . Lupus (systemic lupus erythematosus) ([place])   . Mitral regurgitation    a. 05/2014 echo: EF >55%, nl LV/RV sys fxn, mild MR/AR, no valvular stenosis   . Obesity (BMI 30-39.9)   . Raynaud's phenomenon   . Rheumatoid arthritis ([place])   . Sjogren's disease ([place])   . Spondylolisthesis of lumbar region   . Tobacco abuse    a. ongoing; b. ongoing SOB    PAST SURGICAL HISTORY:  Past Surgical History:  Procedure Laterality Date  . ABDOMINAL HYSTERECTOMY    . BACK SURGERY    . CESAREAN SECTION     x3  . LUMBAR LAMINECTOMY    . LUMBAR  LAMINECTOMY/DECOMPRESSION MICRODISCECTOMY N/A 03/29/2017   Procedure: Re operative Right Lumbar Three-Four Laminectomy and discectomy with Left Lumbar Three-Four, Bilateral Lumbar Four-Five Laminectomy for decompression;  Surgeon: Kevan Ny Ditty, MD;  Location: [facility];  Service: Neurosurgery;  Laterality: N/A;  . SPINAL CORD STIMULATOR INSERTION  12/09/2017   Procedure: LUMBAR SPINAL CORD STIMULATOR IMPLANT;  Surgeon: Eustace Moore, MD;  Location: [facility];  Service: Neurosurgery;;  . SPINAL CORD STIMULATOR REMOVAL N/A 01/11/2018   Procedure: LUMBAR SPINAL CORD STIMULATOR REMOVAL;  Surgeon: Eustace Moore, MD;  Location: [facility];  Service: Neurosurgery;  Laterality: N/A;  . TUBAL LIGATION      SOCIAL HISTORY:  Social History   Tobacco Use  . Smoking status: Former Smoker    Packs/day: 0.50    Years: 10.00    Pack years: 5.00    Types: Cigarettes    Last attempt to quit: 07/22/2015    Years since quitting: 3.6  . Smokeless tobacco: Never Used  Substance Use Topics  . Alcohol use: No    Alcohol/week: 0.0 standard drinks    FAMILY HISTORY:  Family History  Problem Relation Age of Onset  . Hypertension Mother   . Valvular heart disease Mother   . CAD Father 13       CABG x 4  . Liver cancer Father 42  passed  . CAD Paternal Aunt        CABG  . Breast cancer Paternal Aunt   . CAD Paternal Aunt        CABG  . Breast cancer Paternal Aunt   . CAD Paternal Aunt        CABG  . Breast cancer Paternal Aunt   . CAD Paternal Aunt        CABG  . Breast cancer Paternal Aunt   . CAD Paternal Aunt        CABG  . Breast cancer Paternal Aunt   . CAD Paternal Uncle        MI s/p CABG  . Stroke Maternal Grandmother   . Stroke Maternal Grandfather   . CAD Maternal Grandfather     DRUG ALLERGIES:  Allergies  Allergen Reactions  . Pseudoephedrine Hcl Shortness Of Breath and Other (See Comments)    Room starts spinning, can't breath  . Clindamycin/Lincomycin Hives  . Ancef  [Cefazolin] Other (See Comments)    Unknown  . Prednisone Other (See Comments)    Chest pain w/ cortisone shots or prednisone pills Elevated blood pressure  . Adhesive [Tape] Rash and Other (See Comments)    Sensitivity to certain band aids and adhesives - burns skin, makes skin raw  . Other Rash and Other (See Comments)    Sensitivity to certain band aids and adhesives - burns skin, makes skin raw  . Sudafed [Pseudoephedrine Hcl] Other (See Comments)    Dizziness     REVIEW OF SYSTEMS:   CONSTITUTIONAL: No fever, fatigue or weakness.  EYES: No blurred or double vision.  EARS, NOSE, AND THROAT: No tinnitus or ear pain.  RESPIRATORY: No cough, shortness of breath, wheezing or hemoptysis.  CARDIOVASCULAR: No chest pain, orthopnea, edema.  GASTROINTESTINAL: No nausea, vomiting, diarrhea or abdominal pain.  GENITOURINARY: No dysuria, hematuria.  ENDOCRINE: No polyuria, nocturia,  HEMATOLOGY: No anemia, easy bruising or bleeding SKIN: No rash or lesion. MUSCULOSKELETAL: No joint pain or arthritis.   NEUROLOGIC: Left facial/hand/arm/leg numbness/tingling, left facial droop   PSYCHIATRY: No anxiety or depression.   MEDICATIONS AT HOME:  Prior to Admission medications   Medication Sig Start Date End Date Taking? Authorizing Provider  amLODipine (NORVASC) 10 MG tablet Take 1 tablet once daily for blood pressure. Patient taking differently: Take 10 mg by mouth daily.  04/25/18   Pleas Koch, NP  Blood Glucose Monitoring Suppl (ONETOUCH VERIO FLEX SYSTEM) w/Device KIT Use as instructed to test blood sugar 3 times daily 02/12/19   Pleas Koch, NP  buPROPion (WELLBUTRIN XL) 150 MG 24 hr tablet Take 1 tablet by mouth once daily for anxiety and depression. Patient taking differently: Take 150 mg by mouth daily.  04/25/18   Pleas Koch, NP  clobetasol cream (TEMOVATE) 6.16 % Apply 1 application topically 2 (two) times daily as needed (for lupis rash).    [provider]  cyclobenzaprine (FLEXERIL) 10 MG tablet Take 1 tablet (10 mg total) by mouth 3 (three) times daily as needed for muscle spasms. 08/01/18   Pleas Koch, NP  escitalopram (LEXAPRO) 20 MG tablet TAKE 1 TABLET BY MOUTH EVERY DAY 12/15/18   Pleas Koch, NP  folic acid (FOLVITE) 1 MG tablet Take 2 tablets by mouth daily. 01/31/19   Ofilia Neas, PA-C  gabapentin (NEURONTIN) 300 MG capsule Take 600 mg by mouth 3 (three) times daily as needed (for pain).  11/02/16   [provider]  glucose blood test strip Use as instructed to blood sugar 3 times daily 02/12/19   Pleas Koch, NP  Halcinonide 0.1 % OINT Apply 1 application topically 2 (two) times daily as needed (for lupis rash).     [provider]  hydroxychloroquine (PLAQUENIL) 200 MG tablet Take 1 tablet (200 mg total) by mouth 2 (two) times daily. 01/31/19   Ofilia Neas, PA-C  Insulin Glargine (LANTUS) 100 UNIT/ML Solostar Pen Inject 12 Units into the skin every evening. 08/22/18   Pleas Koch, NP  Insulin Pen Needle (PEN NEEDLES) 31G X 6 MM MISC Use with insulin as directed. 04/25/18   Pleas Koch, NP  metFORMIN (GLUCOPHAGE-XR) 500 MG 24 hr tablet Take 2 tablets every morning with food for diabetes. Patient taking differently: Take 1,000 mg by mouth daily with breakfast.  04/25/18   Pleas Koch, NP  methotrexate (RHEUMATREX) 2.5 MG tablet Take 8 tablets (20 mg total) by mouth once a week. Caution:Chemotherapy. Protect from light. 01/31/19   Ofilia Neas, PA-C  metoprolol tartrate (LOPRESSOR) 100 MG tablet Take 1 tablet by mouth twice daily for blood pressure. Patient taking differently: Take 100 mg by mouth 2 (two) times daily. Take 1 tablet by mouth twice daily for blood pressure. 04/25/18   Pleas Koch, NP  pilocarpine (SALAGEN) 5 MG tablet Take 5 mg by mouth daily as needed (for dry mouth).     [provider]  rosuvastatin (CRESTOR) 10 MG tablet Take 1 tablet (10 mg total) by  mouth daily at 6 PM. Take 1 tablet by mouth at bedtime for cholesterol. 08/21/18   Gladstone Lighter, MD  spironolactone (ALDACTONE) 25 MG tablet Take 25 mg by mouth daily.    [provider]  tacrolimus (PROTOPIC) 0.1 % ointment Apply 1 application topically 2 (two) times daily as needed (for lupis rash).     [provider]  Triamcinolone Acetonide (TRIAMCINOLONE 0.1 % CREAM : EUCERIN) CREA Apply 1 application topically 2 (two) times daily as needed for rash or irritation.     [provider]  valsartan-hydrochlorothiazide (DIOVAN-HCT) 320-25 MG tablet Take 1 tablet by mouth once daily for blood pressure. Patient taking differently: Take 1 tablet by mouth at bedtime.  04/25/18   Pleas Koch, NP  Vitamin D, Ergocalciferol, (DRISDOL) 1.25 MG (50000 UT) CAPS capsule Take 1 capsule (50,000 Units total) by mouth 2 (two) times a week. 02/05/19   Bo Merino, MD      PHYSICAL EXAMINATION:   VITAL SIGNS: Height 5' 6"  (1.676 m), weight 99.3 kg.  GENERAL:  53 y.o.-year-old patient lying in the bed with no acute distress.  EYES: Pupils equal, round, reactive to light and accommodation. No scleral icterus. Extraocular muscles intact.  HEENT: Head atraumatic, normocephalic. Oropharynx and nasopharynx clear.  NECK:  Supple, no jugular venous distention. No thyroid enlargement, no tenderness.  LUNGS: Normal breath sounds bilaterally, no wheezing, rales,rhonchi or crepitation. No use of accessory muscles of respiration.  CARDIOVASCULAR: S1, S2 normal. No murmurs, rubs, or gallops.  ABDOMEN: Soft, nontender, nondistended. Bowel sounds present. No organomegaly or mass.  EXTREMITIES: No pedal edema, cyanosis, or clubbing.  NEUROLOGIC: Cranial nerves II through XII are intact. Muscle strength 5/5 in all extremities. Sensation intact. Gait not checked.  PSYCHIATRIC: The patient is alert and oriented x 3.  SKIN: No obvious rash, lesion, or ulcer.   LABORATORY PANEL:    CBC Recent Labs  Lab 03/02/19 1404  WBC 6.5  HGB 13.8  HCT 42.1  PLT 365  MCV 81.9  MCH 26.8  MCHC 32.8  RDW 13.0  LYMPHSABS 2.2  MONOABS 0.4  EOSABS 0.1  BASOSABS 0.1   ------------------------------------------------------------------------------------------------------------------  Chemistries  Recent Labs  Lab 03/02/19 1404  NA 135  K 3.5  CL 97*  CO2 28  GLUCOSE 230*  BUN 9  CREATININE 0.63  CALCIUM 9.7  AST 26  ALT 17  ALKPHOS 101  BILITOT 1.0   ------------------------------------------------------------------------------------------------------------------ estimated creatinine clearance is 95.6 mL/min (by C-G formula based on SCr of 0.63 mg/dL). ------------------------------------------------------------------------------------------------------------------ No results for input(s): TSH, T4TOTAL, T3FREE, THYROIDAB in the last 72 hours.  Invalid input(s): FREET3   Coagulation profile Recent Labs  Lab 03/02/19 1404  INR 1.0   ------------------------------------------------------------------------------------------------------------------- No results for input(s): DDIMER in the last 72 hours. -------------------------------------------------------------------------------------------------------------------  Cardiac Enzymes No results for input(s): CKMB, TROPONINI, MYOGLOBIN in the last 168 hours.  Invalid input(s): CK ------------------------------------------------------------------------------------------------------------------ Invalid input(s): POCBNP  ---------------------------------------------------------------------------------------------------------------  Urinalysis    Component Value Date/Time   COLORURINE YELLOW 01/31/2019 [place] 01/31/2019 1512   LABSPEC 1.026 01/31/2019 1512   PHURINE < OR = 5.0 01/31/2019 1512   GLUCOSEU 3+ (A) 01/31/2019 1512   HGBUR NEGATIVE 01/31/2019 1512   BILIRUBINUR NEGATIVE  01/15/2018 1652   BILIRUBINUR negative 09/19/2017 1038   KETONESUR NEGATIVE 01/31/2019 1512   PROTEINUR NEGATIVE 01/31/2019 1512   UROBILINOGEN 0.2 09/19/2017 1038   NITRITE NEGATIVE 01/31/2019 1512   LEUKOCYTESUR NEGATIVE 01/31/2019 1512     RADIOLOGY: Ct Head Wo Contrast  Result Date: 03/02/2019 CLINICAL DATA:  Left-sided tingling and left facial droop. EXAM: CT HEAD WITHOUT CONTRAST TECHNIQUE: Contiguous axial images were obtained from the base of the skull through the vertex without intravenous contrast. COMPARISON:  Head CT 08/19/2018 FINDINGS: Brain: There is no mass, hemorrhage or extra-axial collection. The size and configuration of the ventricles and extra-axial CSF spaces are normal. There is hypoattenuation of the white matter, most commonly indicating chronic small vessel disease. Vascular: No abnormal hyperdensity of the major intracranial arteries or dural venous sinuses. No intracranial atherosclerosis. Skull: The visualized skull base, calvarium and extracranial soft tissues are normal. Sinuses/Orbits: No fluid levels or advanced mucosal thickening of the visualized paranasal sinuses. No mastoid or middle ear effusion. The orbits are normal. IMPRESSION: 1. No acute intracranial abnormality. 2. Findings of chronic small vessel disease. Electronically Signed   By: Ulyses Jarred M.D.   On: 03/02/2019 14:47    EKG: Orders placed or performed during the hospital encounter of 03/02/19  . ED EKG  . ED EKG  . EKG 12-Lead  . EKG 12-Lead    IMPRESSION AND PLAN: *Acute left-sided paresthesias with facial droop Remains persistent since 4 AM this morning, in similar distribution to previous stroke Admit to observation unit on our CVA/TIA protocol, discontinue aspirin, start Plavix, continue statin therapy, check lipids in the morning, MRI of the brain for further evaluation, echocardiogram, carotid Dopplers, neurochecks per routine, continue close medical monitoring  *Chronic tobacco  smoker abuse/dependency Nicotine patch cessation counseling ordered  *Chronic benign essential hypertension Stable Continue home regiment  *Acute hyperglycemia with chronic diabetes mellitus type 2 Hold metformin while in house, sliding scale insulin with Accu-Cheks per routine, continue Lantus, statin therapy  All the records are reviewed and case discussed with ED provider. Management plans discussed with the patient, family and they are in agreement.  CODE STATUS:full Code Status History    Date Active Date Inactive Code Status  Order ID Comments User Context   08/19/2018 1415 08/21/2018 1931 Full Code [id number]  Dustin Flock, MD Inpatient   05/26/2018 1805 05/30/2018 1621 Full Code [id number]  Eustace Moore, MD Inpatient   01/11/2018 2235 01/16/2018 2117 Full Code [id number]  Eustace Moore, MD Inpatient   12/09/2017 1116 12/13/2017 1551 Full Code [id number]  Eustace Moore, MD Inpatient   03/29/2017 1100 03/31/2017 1824 Full Code [id number]  Traci Sermon, PA-C Inpatient       TOTAL TIME TAKING CARE OF THIS PATIENT: 45 minutes.    Avel Peace Vegas Fritze M.D on 03/02/2019   Between 7am to 6pm - Pager - [phone]  After 6pm go to [email or web address] - password [email or web address] [facility]  Office  [phone]  CC: Primary care physician; Pleas Koch, NP   Note: This dictation was prepared with Dragon dictation along with smaller phrase technology. Any transcriptional errors that result from this process are unintentional.

## 2019-03-03 ENCOUNTER — Observation Stay
Admit: 2019-03-03 | Discharge: 2019-03-03 | Disposition: A | Discharge status: Home / Self Care | Payer: BLUE CROSS/BLUE SHIELD | Attending: Family Medicine | Admitting: Family Medicine

## 2019-03-03 ENCOUNTER — Observation Stay: Payer: BLUE CROSS/BLUE SHIELD

## 2019-03-03 DIAGNOSIS — R0789 Other chest pain: Secondary | ICD-10-CM

## 2019-03-03 LAB — LIPID PANEL
CHOL/HDL RATIO: 5 ratio
Cholesterol: 170 mg/dL (ref 0–200)
HDL: 34 mg/dL — AB (ref >40)
LDL Cholesterol: 91 mg/dL (ref 0–99)
Triglycerides: 223 mg/dL — ABNORMAL HIGH (ref <150)
VLDL: 45 mg/dL — ABNORMAL HIGH (ref 0–40)

## 2019-03-03 LAB — ECHOCARDIOGRAM COMPLETE
Height: 66 in
Weight: 3504 oz

## 2019-03-03 LAB — HEMOGLOBIN A1C
Hgb A1c MFr Bld: 9.5 % — ABNORMAL HIGH (ref 4.8–5.6)
Mean Plasma Glucose: 225.95 mg/dL

## 2019-03-03 MED ORDER — PERFLUTREN LIPID MICROSPHERE
1.0000 mL | INTRAVENOUS | Status: AC | PRN
  Administered 2019-03-03: 2 mL via INTRAVENOUS
  Filled 2019-03-03: qty 10

## 2019-03-03 NOTE — Evaluation (Signed)
Occupational Therapy Evaluation Patient Details Name: Lauren Winters MRN: [id number] DOB: 05-03-1966 Today's Date: 03/03/2019    History of Present Illness Pt is a 53 year old female admitted with report of L side weakness and parasthesia.  PMH includes stroke affecting R side, Lupus, RA and Sjogren's.   Clinical Impression   Pt seen for OT evaluation this date. Prior to hospital admission, pt was modified independent in functional mobility using SPC, mod indep for ADL (slip on shoes, leans on shower wall for bathing), and endorses 4-5 falls over past 12 months. Pt lives with her daughter in a 1 story home with  steps to enter with bilat hand rails. Currently pt demonstrates impairments in L sided strength/coordination/sensation deficits and decreased balance impairing her ability to perform mobility and ADL tasks at baseline modified independence. Currently requires CGA to Min A for LB ADL tasks. Pt endorses difficulty with chronic back pain and bilat hip pain as well. Pt denies visual and cognitive  deficits. Pt instructed in [facility] handout for L hand to improve her ability to independently self feed and groom among other ADL and IADL tasks including holding a book to read which she enjoys. Pt verbalizes understanding but would benefit from additional skilled OT services to maximize recall and carryover of learned strategies as well as to maximize return to PLOF/independence, minimize falls risk, and minimize caregiver burden. Recommend HHOT services upon discharge.    Follow Up Recommendations  Home health OT;Supervision - Intermittent    Equipment Recommendations  3 in 1 bedside commode    Recommendations for Other Services       Precautions / Restrictions Precautions Precautions: Fall Restrictions Weight Bearing Restrictions: No      Mobility Bed Mobility Overal bed mobility: Modified Independent             General bed mobility comments: increased time  Transfers Overall  transfer level: Needs assistance Equipment used: Rolling walker (2 wheeled) Transfers: Sit to/from Stand Sit to Stand: Supervision         General transfer comment: Pt able to stand with use of UE's; good use of RW.    Balance Overall balance assessment: Needs assistance Sitting-balance support: Bilateral upper extremity supported Sitting balance-Leahy Scale: Good     Standing balance support: Bilateral upper extremity supported Standing balance-Leahy Scale: Fair Standing balance comment: Requires RW for longer distance walking and STS.  Able to stand in quiet stance briefly without UE support.                           ADL either performed or assessed with clinical judgement   ADL Overall ADL's : Needs assistance/impaired Eating/Feeding: Set up;Sitting Eating/Feeding Details (indicate cue type and reason): increased difficulty when incorporating LUE Grooming: Set up;Sitting Grooming Details (indicate cue type and reason): increased difficulty when incorporating LUE Upper Body Bathing: Sitting;Set up;Supervision/ safety   Lower Body Bathing: Sit to/from stand;Minimal assistance   Upper Body Dressing : Sitting;Set up;Supervision/safety   Lower Body Dressing: Sit to/from stand;Minimal assistance   Toilet Transfer: BSC;Ambulation;RW;Min guard                   Vision Baseline Vision/History: Wears glasses Wears Glasses: At all times Patient Visual Report: No change from baseline Vision Assessment?: No apparent visual deficits     Perception     Praxis      Pertinent Vitals/Pain Pain Assessment: 0-10 Pain Score: 8  Faces Pain Scale:  Hurts little more Pain Location: low back, B hips (R worse than L) Pain Descriptors / Indicators: Aching Pain Intervention(s): Limited activity within patient's tolerance;Monitored during session;RN gave pain meds during session;Patient requesting pain meds-RN notified     Hand Dominance Right   Extremity/Trunk  Assessment Upper Extremity Assessment Upper Extremity Assessment: LUE deficits/detail(RUE at least 4/5) LUE Deficits / Details: LUE 4-/5, impaired sensation (n/t), impaired thumb opposition LUE Sensation: decreased light touch LUE Coordination: decreased fine motor;decreased gross motor   Lower Extremity Assessment Lower Extremity Assessment: Defer to PT evaluation;Overall WFL for tasks assessed;LLE deficits/detail(Bilateral hip flexion 2/5 limited by pain) LLE Deficits / Details: L ankle, knee: 4-/5   Cervical / Trunk Assessment Cervical / Trunk Assessment: Kyphotic   Communication Communication Communication: No difficulties   Cognition Arousal/Alertness: Awake/alert Behavior During Therapy: WFL for tasks assessed/performed Overall Cognitive Status: Within Functional Limits for tasks assessed                                     General Comments  Coordination: UE: finger to nose: L: 8 sec, R: 6 sec    Exercises Other Exercises Other Exercises: pt instructed in [facility] ex for LUE with visual demo, verbal instruction, and handout provided    Shoulder Instructions      Home Living Family/patient expects to be discharged to:: Private residence Living Arrangements: Spouse/significant other;Children Available Help at Discharge: Family;Available PRN/intermittently Type of Home: House Home Access: Stairs to enter [employer] of Steps: 3 Entrance Stairs-Rails: Can reach both Home Layout: One level     Bathroom Shower/Tub: Occupational psychologist: Handicapped height     Home Equipment: Cane - single point;Walker - 4 wheels          Prior Functioning/Environment Level of Independence: Needs assistance  Gait / Transfers Assistance Needed: Uses SPC for most mobility and uses 4WW when back is bothering her ADL's / Homemaking Assistance Needed: Wears slide on shoes instead of shoes with laces, able to bathe herself; leans on shower wall to take a  shower            OT Problem List: Decreased strength;Decreased coordination;Decreased knowledge of use of DME or AE;Pain;Impaired sensation;Impaired balance (sitting and/or standing);Impaired UE functional use;Decreased range of motion      OT Treatment/Interventions: Self-care/ADL training;Balance training;Therapeutic exercise;Therapeutic activities;Neuromuscular education;DME and/or AE instruction;Patient/family education    OT Goals(Current goals can be found in the care plan section) Acute Rehab OT Goals Patient Stated Goal: To return to OP rehab once she regains her strength and address back and hip pain. OT Goal Formulation: With patient Time For Goal Achievement: 03/17/19 Potential to Achieve Goals: Good ADL Goals Pt Will Perform Eating: with modified independence;sitting Pt Will Perform Lower Body Dressing: with supervision;sit to/from stand Pt Will Transfer to Toilet: with supervision;ambulating(LRAD for amb, comfort height toilet) Pt/caregiver will Perform Home Exercise Program: Left upper extremity;Increased strength;With written HEP provided;Independently(for LUE FMC/strengthening)  OT Frequency: Min 2X/week   Barriers to D/C:            Co-evaluation              AM-PAC OT "6 Clicks" Daily Activity     Outcome Measure Help from another person eating meals?: A Little Help from another person taking care of personal grooming?: A Little Help from another person toileting, which includes using toliet, bedpan, or urinal?: A Little Help from  another person bathing (including washing, rinsing, drying)?: A Little Help from another person to put on and taking off regular upper body clothing?: A Little Help from another person to put on and taking off regular lower body clothing?: A Little 6 Click Score: 18   End of Session    Activity Tolerance: Patient tolerated treatment well Patient left: in bed;with call bell/phone within reach;with bed alarm set;with  nursing/sitter in room  OT Visit Diagnosis: Other abnormalities of gait and mobility (R26.89);Repeated falls (R29.6);Hemiplegia and hemiparesis Hemiplegia - Right/Left: Left Hemiplegia - dominant/non-dominant: Non-Dominant Hemiplegia - caused by: Unspecified                Time: 2482-5003 OT Time Calculation (min): 17 min Charges:  OT General Charges $OT Visit: 1 Visit OT Evaluation $OT Eval Low Complexity: 1 Low OT Treatments $Neuromuscular Re-education: 8-22 mins  Jeni Salles, MPH, MS, OTR/L ascom [phone] 03/03/19, 10:41 AM

## 2019-03-03 NOTE — Progress Notes (Signed)
SLP Cancellation Note  Patient Details Name: KALIOPE QUINONEZ MRN: [id number] DOB: Feb 05, 1966   Cancelled treatment:       Reason Eval/Treat Not Completed: SLP screened, no needs identified, will sign off; Patient A&Ox3, Reliable historian of personal/medical information. Able to respond appropriately to wh- and y/n questions at the sentence/conversation level without noted difficulty. Reported no significant change in memory and/or disorientation. No reported swallowing difficulty this AM with breakfast. Recent CT/MRI showed no acute abnormalities. D/t previously stated factors, skilled ST services not indicated at this time.   Loni Beckwith 03/03/2019, 10:01 AM Loni Beckwith, M.S. [place] Speech-Language Pathologist

## 2019-03-03 NOTE — Progress Notes (Signed)
[facility] [place] NAME: Lauren Winters    MR#:  [id number]  DATE OF BIRTH:  1966-12-25  SUBJECTIVE:  CHIEF COMPLAINT:   Chief Complaint  Patient presents with  . Numbness     Patient initially admitted with left-sided numbness and facial droop.  This morning was complaining of headaches.  Physical therapist at bedside working with patient this morning. REVIEW OF SYSTEMS:  Review of Systems  Constitutional: Negative for chills and fever.  HENT: Negative for hearing loss and tinnitus.   Eyes: Negative for blurred vision, double vision and photophobia.  Respiratory: Negative for cough and shortness of breath.   Cardiovascular: Negative for chest pain and palpitations.  Gastrointestinal: Negative for abdominal pain, heartburn, nausea and vomiting.  Genitourinary: Negative for dysuria and urgency.  Musculoskeletal: Negative for myalgias and neck pain.  Skin: Negative for itching and rash.  Neurological: Positive for headaches. Negative for dizziness and speech change.  Psychiatric/Behavioral: Negative for depression and substance abuse.    DRUG ALLERGIES:   Allergies  Allergen Reactions  . Pseudoephedrine Hcl Shortness Of Breath and Other (See Comments)    Room starts spinning, can't breath  . Ancef [Cefazolin] Other (See Comments)    Unknown  . Clindamycin/Lincomycin Hives  . Prednisone Other (See Comments)    Chest pain w/ cortisone shots or prednisone pills Elevated blood pressure  . Adhesive [Tape] Rash and Other (See Comments)    Sensitivity to certain band aids and adhesives - burns skin, makes skin raw  . Other Rash and Other (See Comments)    Sensitivity to certain band aids and adhesives - burns skin, makes skin raw  . Sudafed [Pseudoephedrine Hcl] Other (See Comments)    Dizziness    VITALS:  Blood pressure 115/62, pulse 73, temperature 98.2 F (36.8 C), temperature source Oral, resp. rate 20, height 5\' 6"  (1.676  m), weight 99.3 kg, SpO2 96 %. PHYSICAL EXAMINATION:  Physical Exam  GENERAL:  53 y.o.-year-old patient lying in the bed with no acute distress.  EYES: Pupils equal, round, reactive to light and accommodation. No scleral icterus. Extraocular muscles intact.  HEENT: Head atraumatic, normocephalic. Oropharynx and nasopharynx clear.  NECK:  Supple, no jugular venous distention. No thyroid enlargement, no tenderness.  LUNGS: Normal breath sounds bilaterally, no wheezing, rales,rhonchi or crepitation. No use of accessory muscles of respiration.  CARDIOVASCULAR: S1, S2 normal. No murmurs, rubs, or gallops.  ABDOMEN: Soft, nontender, nondistended. Bowel sounds present. No organomegaly or mass.  EXTREMITIES: No pedal edema, cyanosis, or clubbing.  NEUROLOGIC: Cranial nerves II through XII are intact. Muscle strength 4/5 in all extremities. Sensation intact. Gait not checked.  PSYCHIATRIC: The patient is alert and oriented x 3.  SKIN: No obvious rash, lesion, or ulcer.  LABORATORY PANEL:  Female CBC Recent Labs  Lab 03/02/19 1404  WBC 6.5  HGB 13.8  HCT 42.1  PLT 365   ------------------------------------------------------------------------------------------------------------------ Chemistries  Recent Labs  Lab 03/02/19 1404  NA 135  K 3.5  CL 97*  CO2 28  GLUCOSE 230*  BUN 9  CREATININE 0.63  CALCIUM 9.7  AST 26  ALT 17  ALKPHOS 101  BILITOT 1.0   RADIOLOGY:  Dg Chest 2 View  Result Date: 03/02/2019 CLINICAL DATA:  Stroke. Some chest pain. Ex-smoker. EXAM: CHEST - 2 VIEW COMPARISON:  01/11/2018. FINDINGS: Normal sized heart. Clear lungs with normal vascularity. Minimal peribronchial thickening. Minimal thoracic spine degenerative changes. IMPRESSION: Stable minimal bronchitic changes.  No  acute abnormality. Electronically Signed   By: Claudie Revering M.D.   On: 03/02/2019 19:09   Ct Head Wo Contrast  Result Date: 03/02/2019 CLINICAL DATA:  Left-sided tingling and left facial droop.  EXAM: CT HEAD WITHOUT CONTRAST TECHNIQUE: Contiguous axial images were obtained from the base of the skull through the vertex without intravenous contrast. COMPARISON:  Head CT 08/19/2018 FINDINGS: Brain: There is no mass, hemorrhage or extra-axial collection. The size and configuration of the ventricles and extra-axial CSF spaces are normal. There is hypoattenuation of the white matter, most commonly indicating chronic small vessel disease. Vascular: No abnormal hyperdensity of the major intracranial arteries or dural venous sinuses. No intracranial atherosclerosis. Skull: The visualized skull base, calvarium and extracranial soft tissues are normal. Sinuses/Orbits: No fluid levels or advanced mucosal thickening of the visualized paranasal sinuses. No mastoid or middle ear effusion. The orbits are normal. IMPRESSION: 1. No acute intracranial abnormality. 2. Findings of chronic small vessel disease. Electronically Signed   By: Ulyses Jarred M.D.   On: 03/02/2019 14:47   Mr Brain Wo Contrast  Result Date: 03/02/2019 CLINICAL DATA:  LEFT sided weakness beginning at 4 a.m. History of diabetes, hypertension, lupus. EXAM: MRI HEAD WITHOUT CONTRAST MRA HEAD WITHOUT CONTRAST TECHNIQUE: Multiplanar, multiecho pulse sequences of the brain and surrounding structures were obtained without intravenous contrast. Angiographic images of the head were obtained using MRA technique without contrast. COMPARISON:  None. FINDINGS: MRI HEAD FINDINGS INTRACRANIAL CONTENTS: No reduced diffusion to suggest acute ischemia or hyperacute demyelination. No susceptibility artifact to suggest hemorrhage. No parenchymal brain volume loss for age. No hydrocephalus. Scattered subcentimeter supratentorial white matter FLAIR T2 hyperintensities, greater than expected for age, a few of which are along the periventricular margin; however no T1 hypointensity associated with black holes of demyelination. No suspicious parenchymal signal, masses, mass  effect. No abnormal extra-axial fluid collections. No extra-axial masses. VASCULAR: Normal major intracranial vascular flow voids present at skull base. SKULL AND UPPER CERVICAL SPINE: No abnormal sellar expansion. No suspicious calvarial bone marrow signal. Craniocervical junction maintained. SINUSES/ORBITS: The mastoid air-cells and included paranasal sinuses are well-aerated.The included ocular globes and orbital contents are non-suspicious. OTHER: None. MRA HEAD FINDINGS ANTERIOR CIRCULATION: Normal flow related enhancement of the included cervical, petrous, cavernous and supraclinoid internal carotid arteries. Patent anterior communicating artery. Patent anterior and middle cerebral arteries. No large vessel occlusion, flow limiting stenosis, or aneurysm. POSTERIOR CIRCULATION: LEFT vertebral artery is dominant, RIGHT vertebral artery terminates in the posterior inferior cerebellar artery. Vertebrobasilar arteries are patent, with normal flow related enhancement of the main branch vessels. Flow-limiting stenosis versus focally occluded LEFT P2 segment with thready reconstitution/collateralization. Bilateral posterior communicating arteries present. No large vessel occlusion, or aneurysm. ANATOMIC VARIANTS: None. Source data and MIP images were reviewed. IMPRESSION: MRI HEAD: 1. No acute intracranial process. 2. Mild white matter changes most commonly seen with chronic small vessel ischemic disease. MRA HEAD: 1. LEFT P2 flow-limiting stenosis versus focal occlusion with intermediate collaterals. Electronically Signed   By: Elon Alas M.D.   On: 03/02/2019 23:16   US Carotid Bilateral (at [facility] Only)  Result Date: 03/03/2019 CLINICAL DATA:  CVA. EXAM: BILATERAL CAROTID DUPLEX ULTRASOUND TECHNIQUE: Pearline Cables scale imaging, color Doppler and duplex ultrasound were performed of bilateral carotid and vertebral arteries in the neck. COMPARISON:  08/19/2018 FINDINGS: Criteria: Quantification of carotid  stenosis is based on velocity parameters that correlate the residual internal carotid diameter with NASCET-based stenosis levels, using the diameter of the distal internal carotid  lumen as the denominator for stenosis measurement. The following velocity measurements were obtained: RIGHT ICA: 105 cm/sec CCA: 84 cm/sec SYSTOLIC ICA/CCA RATIO:  1.2 ECA: 112 cm/sec LEFT ICA: 119 cm/sec CCA: 924 cm/sec SYSTOLIC ICA/CCA RATIO:  1.1 ECA: 98 cm/sec RIGHT CAROTID ARTERY: Echogenic plaque at the right carotid bulb without significant stenosis. External carotid artery is patent with normal waveform. Normal waveforms and velocities in the internal carotid artery. RIGHT VERTEBRAL ARTERY: Antegrade flow and normal waveform in the right vertebral artery. LEFT CAROTID ARTERY: Minimal echogenic plaque at the left carotid bulb without significant stenosis. External carotid artery is patent with normal waveform. Minimal plaque in the proximal internal carotid artery. Normal waveforms and velocities in the internal carotid artery. LEFT VERTEBRAL ARTERY: Antegrade flow and normal waveform in the left vertebral artery. 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 Estimated degree of stenosis in the internal carotid arteries is less than 50% bilaterally. Patent vertebral arteries with antegrade flow. Electronically Signed   By: Markus Daft M.D.   On: 03/03/2019 09:40   Mr Jodene Nam Head/brain QA Cm  Result Date: 03/02/2019 CLINICAL DATA:  LEFT sided weakness beginning at 4 a.m. History of diabetes, hypertension, lupus. EXAM: MRI HEAD WITHOUT CONTRAST MRA HEAD WITHOUT CONTRAST TECHNIQUE: Multiplanar, multiecho pulse sequences of the brain and surrounding structures were obtained without intravenous contrast. Angiographic images of the head were obtained using MRA technique without contrast. COMPARISON:  None. FINDINGS: MRI HEAD FINDINGS INTRACRANIAL CONTENTS: No reduced diffusion  to suggest acute ischemia or hyperacute demyelination. No susceptibility artifact to suggest hemorrhage. No parenchymal brain volume loss for age. No hydrocephalus. Scattered subcentimeter supratentorial white matter FLAIR T2 hyperintensities, greater than expected for age, a few of which are along the periventricular margin; however no T1 hypointensity associated with black holes of demyelination. No suspicious parenchymal signal, masses, mass effect. No abnormal extra-axial fluid collections. No extra-axial masses. VASCULAR: Normal major intracranial vascular flow voids present at skull base. SKULL AND UPPER CERVICAL SPINE: No abnormal sellar expansion. No suspicious calvarial bone marrow signal. Craniocervical junction maintained. SINUSES/ORBITS: The mastoid air-cells and included paranasal sinuses are well-aerated.The included ocular globes and orbital contents are non-suspicious. OTHER: None. MRA HEAD FINDINGS ANTERIOR CIRCULATION: Normal flow related enhancement of the included cervical, petrous, cavernous and supraclinoid internal carotid arteries. Patent anterior communicating artery. Patent anterior and middle cerebral arteries. No large vessel occlusion, flow limiting stenosis, or aneurysm. POSTERIOR CIRCULATION: LEFT vertebral artery is dominant, RIGHT vertebral artery terminates in the posterior inferior cerebellar artery. Vertebrobasilar arteries are patent, with normal flow related enhancement of the main branch vessels. Flow-limiting stenosis versus focally occluded LEFT P2 segment with thready reconstitution/collateralization. Bilateral posterior communicating arteries present. No large vessel occlusion, or aneurysm. ANATOMIC VARIANTS: None. Source data and MIP images were reviewed. IMPRESSION: MRI HEAD: 1. No acute intracranial process. 2. Mild white matter changes most commonly seen with chronic small vessel ischemic disease. MRA HEAD: 1. LEFT P2 flow-limiting stenosis versus focal occlusion with  intermediate collaterals. Electronically Signed   By: Elon Alas M.D.   On: 03/02/2019 23:16   ASSESSMENT AND PLAN:   1. Acute left-sided paresthesias with facial droop Patient had MRI of the brain done already with no acute CVA.  MRA head done reveale LEFT P2 flow-limiting stenosis versus focal occlusion with intermediate collaterals. Symptoms said to be in similar distribution to previous stroke. Already initiated CVA/TIA protocol. Seen by occupational therapist.  Home health with OT recommended.  Seen by speech therapist.  No needs identified.  Seen by physical therapist.  Home health with physical therapy with intermittent supervision recommended.  Patient has chronic back pains and uses rolling walker at baseline.  Continue using the same on discharge. Aspirin was discontinued since patient had symptoms while on aspirin.  Patient currently on Plavix and statins.  Lipid panel done with LDL of 91. Neurologist already consulted. Follow-up on neurology evaluation and recommendations  2. Chronic tobacco smoker abuse/dependency Nicotine patch cessation counseling ordered  3. Chronic benign essential hypertension Stable Continue home regiment  4. Acute hyperglycemia with chronic diabetes mellitus type 2 Hold metformin while in house, sliding scale insulin with Accu-Cheks per routine, continue Lantus, statin therapy.  Recent glycosylated hemoglobin level of 5.2.  DVT prophylaxis; Lovenox  All the records are reviewed and case discussed with Care Management/Social Worker. Management plans discussed with the patient, family and they are in agreement.  CODE STATUS: Full Code  TOTAL TIME TAKING CARE OF THIS PATIENT: 27 minutes.   More than 50% of the time was spent in counseling/coordination of care: YES  POSSIBLE D/C IN 1 DAY, DEPENDING ON CLINICAL CONDITION.   Eugenia Eldredge M.D on 03/03/2019 at 11:54 AM  Between 7am to 6pm - Pager - [phone]  After 6pm go to [email or web address] -  Proofreader  [facility]  Office  [phone]  CC: Primary care physician; Pleas Koch, NP  Note: This dictation was prepared with Dragon dictation along with smaller phrase technology. Any transcriptional errors that result from this process are unintentional.

## 2019-03-03 NOTE — Progress Notes (Signed)
*  PRELIMINARY RESULTS* Echocardiogram 2D Echocardiogram has been performed. Definity IV Contrast used on this study.  Lauren Winters 03/03/2019, 9:22 AM

## 2019-03-03 NOTE — Progress Notes (Addendum)
Stroke workup done with carotid US and ECHO done. Reports paresthesias on left face, arm, and leg more than usual left hand grip/left foot slightly weaker than right. . Gabapentin and tylenol given for chronic back pain. Up to BR and chair with walker and tolerated well. NIH=1.

## 2019-03-03 NOTE — Plan of Care (Signed)
  Problem: Education: Goal: Knowledge of disease or condition will improve Outcome: Progressing Goal: Knowledge of secondary prevention will improve Outcome: Progressing Goal: Knowledge of patient specific risk factors addressed and post discharge goals established will improve Outcome: Progressing   Problem: Coping: Goal: Will verbalize positive feelings about self Outcome: Progressing   Problem: Self-Care: Goal: Ability to participate in self-care as condition permits will improve Outcome: Progressing   Problem: Ischemic Stroke/TIA Tissue Perfusion: Goal: Complications of ischemic stroke/TIA will be minimized Outcome: Progressing

## 2019-03-03 NOTE — Evaluation (Signed)
Physical Therapy Evaluation Patient Details Name: Lauren Winters MRN: [id number] DOB: 1966/05/04 Today's Date: 03/03/2019   History of Present Illness  Pt is a 53 year old female admitted with report of L side weakness and parasthesia.  PMH includes stroke affecting R side, Lupus, RA and Sjogren's.  Clinical Impression  Pt is a 53 year old female who lives in a single story home with her husband.  She has chronic back and hip pain and uses a SPC for most mobility, though she sometimes uses a 4WW when pain is having a greater effect on mobility.  Pt reported L side N/T and usual low back/hip pain at initiation of evaluation.  She was able to perform bed mobility mod I and sit at EOB without difficulty.  Pt presented with strength deficits of L side as compared to R and reported N/T of L side as well.  She was able to perform STS with use of UE's and RW.  PT provided CGA as pt walked in room with RW with antalgic gait and reporting increasing HA as she stood.  UE coordination testing revealed mild tremor but no significant asymmetry.  Pt completed 50 ft and requested to return to her chair.  Pt will continue to benefit from skilled PT with focus on strength, pain management and safe functional mobility.  Pt will benefit from [facility] PT following discharge.  Follow Up Recommendations Home health PT;Supervision - Intermittent    Equipment Recommendations  3in1 (PT)    Recommendations for Other Services       Precautions / Restrictions Precautions Precautions: Fall Restrictions Weight Bearing Restrictions: No      Mobility  Bed Mobility Overal bed mobility: Modified Independent             General bed mobility comments: increased time  Transfers Overall transfer level: Needs assistance Equipment used: Rolling walker (2 wheeled) Transfers: Sit to/from Stand Sit to Stand: Supervision         General transfer comment: Pt able to stand with use of UE's; good use of  RW.  Ambulation/Gait Ambulation/Gait assistance: Min guard Gait Distance (Feet): 50 Feet Assistive device: Rolling walker (2 wheeled)     Gait velocity interpretation: <1.8 ft/sec, indicate of risk for recurrent falls General Gait Details: Slow antalgic gait with mod use of RW.  Pt unable to walk farther due to hip pain and report of increasing HA.  Stairs            Wheelchair Mobility    Modified Rankin (Stroke Patients Only)       Balance Overall balance assessment: Needs assistance Sitting-balance support: Bilateral upper extremity supported Sitting balance-Leahy Scale: Good     Standing balance support: Bilateral upper extremity supported Standing balance-Leahy Scale: Fair Standing balance comment: Requires RW for longer distance walking and STS.  Able to stand in quiet stance briefly without UE support.                             Pertinent Vitals/Pain Pain Assessment: Faces Faces Pain Scale: Hurts little more Pain Location: Low back and R hip, HA Pain Intervention(s): Limited activity within patient's tolerance;Monitored during session    [place] expects to be discharged to:: Private residence Living Arrangements: Spouse/significant other;Children Available Help at Discharge: Family;Available PRN/intermittently Type of Home: House Home Access: Stairs to enter Entrance Stairs-Rails: Can reach both Entrance Stairs-Number of Steps: 3 Home Layout: One level Home Equipment:  Cane - single point;Walker - 4 wheels      Prior Function Level of Independence: Needs assistance   Gait / Transfers Assistance Needed: Uses SPC for most mobility and uses 4WW when back is bothering her  ADL's / Homemaking Assistance Needed: Wears slide on shoes instead of shoes with laces, able to bathe herself; leans on shower wall to take a shower        Hand Dominance        Extremity/Trunk Assessment   Upper Extremity Assessment Upper  Extremity Assessment: Overall WFL for tasks assessed;LUE deficits/detail(R elbow/shoulder 4-/5) LUE Deficits / Details: L elbow/shoulder: 3+/5 LUE Sensation: decreased light touch    Lower Extremity Assessment Lower Extremity Assessment: Overall WFL for tasks assessed;LLE deficits/detail(Bilateral hip flexion 2/5 limited by pain.) LLE Deficits / Details: L ankle, knee: 4-/5    Cervical / Trunk Assessment Cervical / Trunk Assessment: Kyphotic  Communication   Communication: No difficulties  Cognition Arousal/Alertness: Awake/alert Behavior During Therapy: WFL for tasks assessed/performed Overall Cognitive Status: Within Functional Limits for tasks assessed                                        General Comments      Exercises     Assessment/Plan    PT Assessment Patient needs continued PT services  PT Problem List Decreased strength;Decreased mobility;Decreased activity tolerance;Decreased balance;Impaired sensation;Pain       PT Treatment Interventions DME instruction;Therapeutic activities;Gait training;Therapeutic exercise;Stair training;Balance training;Functional mobility training;Neuromuscular re-education;Patient/family education    PT Goals (Current goals can be found in the Care Plan section)  Acute Rehab PT Goals Patient Stated Goal: To return to OP rehab once she regains her strength and address back and hip pain. PT Goal Formulation: With patient Time For Goal Achievement: 03/17/19 Potential to Achieve Goals: Good    Frequency Min 2X/week   Barriers to discharge        Co-evaluation               AM-PAC PT "6 Clicks" Mobility  Outcome Measure Help needed turning from your back to your side while in a flat bed without using bedrails?: None Help needed moving from lying on your back to sitting on the side of a flat bed without using bedrails?: A Little Help needed moving to and from a bed to a chair (including a wheelchair)?: A  Little Help needed standing up from a chair using your arms (e.g., wheelchair or bedside chair)?: A Little Help needed to walk in hospital room?: A Little Help needed climbing 3-5 steps with a railing? : A Lot 6 Click Score: 18    End of Session Equipment Utilized During Treatment: Gait belt Activity Tolerance: Patient limited by pain Patient left: in chair;with chair alarm set;with call bell/phone within reach Nurse Communication: Mobility status PT Visit Diagnosis: Unsteadiness on feet (R26.81);Muscle weakness (generalized) (M62.81);Hemiplegia and hemiparesis Hemiplegia - Right/Left: Left Hemiplegia - caused by: Unspecified    Time: 2482-5003 PT Time Calculation (min) (ACUTE ONLY): 22 min   Charges:   PT Evaluation $PT Eval Moderate Complexity: 1 Mod          Roxanne Gates, PT, DPT   Roxanne Gates 03/03/2019, 9:42 AM

## 2019-03-04 ENCOUNTER — Observation Stay: Payer: BLUE CROSS/BLUE SHIELD

## 2019-03-04 DIAGNOSIS — E1165 Type 2 diabetes mellitus with hyperglycemia: Secondary | ICD-10-CM | POA: Diagnosis present

## 2019-03-04 DIAGNOSIS — G8929 Other chronic pain: Secondary | ICD-10-CM | POA: Diagnosis present

## 2019-03-04 DIAGNOSIS — Z7982 Long term (current) use of aspirin: Secondary | ICD-10-CM | POA: Diagnosis not present

## 2019-03-04 DIAGNOSIS — I7300 Raynaud's syndrome without gangrene: Secondary | ICD-10-CM | POA: Diagnosis present

## 2019-03-04 DIAGNOSIS — R29810 Facial weakness: Secondary | ICD-10-CM | POA: Diagnosis present

## 2019-03-04 DIAGNOSIS — Z8673 Personal history of transient ischemic attack (TIA), and cerebral infarction without residual deficits: Secondary | ICD-10-CM | POA: Diagnosis not present

## 2019-03-04 DIAGNOSIS — E785 Hyperlipidemia, unspecified: Secondary | ICD-10-CM | POA: Diagnosis present

## 2019-03-04 DIAGNOSIS — I340 Nonrheumatic mitral (valve) insufficiency: Secondary | ICD-10-CM | POA: Diagnosis present

## 2019-03-04 DIAGNOSIS — R209 Unspecified disturbances of skin sensation: Secondary | ICD-10-CM

## 2019-03-04 DIAGNOSIS — F419 Anxiety disorder, unspecified: Secondary | ICD-10-CM | POA: Diagnosis present

## 2019-03-04 DIAGNOSIS — F329 Major depressive disorder, single episode, unspecified: Secondary | ICD-10-CM | POA: Diagnosis present

## 2019-03-04 DIAGNOSIS — Z8249 Family history of ischemic heart disease and other diseases of the circulatory system: Secondary | ICD-10-CM | POA: Diagnosis not present

## 2019-03-04 DIAGNOSIS — I639 Cerebral infarction, unspecified: Secondary | ICD-10-CM | POA: Diagnosis present

## 2019-03-04 DIAGNOSIS — M069 Rheumatoid arthritis, unspecified: Secondary | ICD-10-CM | POA: Diagnosis present

## 2019-03-04 DIAGNOSIS — Z803 Family history of malignant neoplasm of breast: Secondary | ICD-10-CM | POA: Diagnosis not present

## 2019-03-04 DIAGNOSIS — M797 Fibromyalgia: Secondary | ICD-10-CM | POA: Diagnosis present

## 2019-03-04 DIAGNOSIS — R202 Paresthesia of skin: Secondary | ICD-10-CM | POA: Diagnosis not present

## 2019-03-04 DIAGNOSIS — M3500 Sicca syndrome, unspecified: Secondary | ICD-10-CM | POA: Diagnosis present

## 2019-03-04 DIAGNOSIS — I10 Essential (primary) hypertension: Secondary | ICD-10-CM | POA: Diagnosis present

## 2019-03-04 DIAGNOSIS — Z981 Arthrodesis status: Secondary | ICD-10-CM | POA: Diagnosis not present

## 2019-03-04 DIAGNOSIS — Z888 Allergy status to other drugs, medicaments and biological substances status: Secondary | ICD-10-CM | POA: Diagnosis not present

## 2019-03-04 DIAGNOSIS — IMO0001 Reserved for inherently not codable concepts without codable children: Secondary | ICD-10-CM | POA: Diagnosis present

## 2019-03-04 DIAGNOSIS — Z87891 Personal history of nicotine dependence: Secondary | ICD-10-CM | POA: Diagnosis not present

## 2019-03-04 DIAGNOSIS — M3219 Other organ or system involvement in systemic lupus erythematosus: Secondary | ICD-10-CM | POA: Diagnosis present

## 2019-03-04 DIAGNOSIS — Z823 Family history of stroke: Secondary | ICD-10-CM | POA: Diagnosis not present

## 2019-03-04 DIAGNOSIS — Z91048 Other nonmedicinal substance allergy status: Secondary | ICD-10-CM | POA: Diagnosis not present

## 2019-03-04 DIAGNOSIS — Z881 Allergy status to other antibiotic agents status: Secondary | ICD-10-CM | POA: Diagnosis not present

## 2019-03-04 DIAGNOSIS — Z800 Family history of malignant neoplasm of digestive organs: Secondary | ICD-10-CM | POA: Diagnosis not present

## 2019-03-04 LAB — BASIC METABOLIC PANEL
ANION GAP: 7 (ref 5–15)
BUN: 16 mg/dL (ref 6–20)
CALCIUM: 9.3 mg/dL (ref 8.9–10.3)
CO2: 30 mmol/L (ref 22–32)
Chloride: 99 mmol/L (ref 98–111)
Creatinine, Ser: 0.91 mg/dL (ref 0.44–1.00)
GFR calc non Af Amer: >60 mL/min (ref >60)
Glucose, Bld: 207 mg/dL — ABNORMAL HIGH (ref 70–99)
Potassium: 3.8 mmol/L (ref 3.5–5.1)
SODIUM: 136 mmol/L (ref 135–145)

## 2019-03-04 LAB — MAGNESIUM: Magnesium: 2 mg/dL (ref 1.7–2.4)

## 2019-03-04 MED ORDER — GADOBUTROL 1 MMOL/ML IV SOLN
9.0000 mL | Freq: Once | INTRAVENOUS | Status: AC | PRN
  Administered 2019-03-04: 9 mL via INTRAVENOUS

## 2019-03-04 MED ORDER — SODIUM CHLORIDE 0.9% FLUSH
3.0000 mL | Freq: Two times a day (BID) | INTRAVENOUS | Status: DC
  Administered 2019-03-04 – 2019-03-06 (×5): 3 mL via INTRAVENOUS

## 2019-03-04 NOTE — Consult Note (Signed)
Reason for Consult: Numbness Referring Physician: Dr. Talbert Forest is an 53 y.o. female.  HPI: Patient admitted 2 days ago for left face, arm, and leg numbness and tingling.  The leg has normalized, but the symptoms in the face and arm continue.  MRI Brain did not show an acute infarct, but she does have moderately sized white matter lesions in the left frontal, right and left parietal, and right periventricular areas.  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  CDUS is normal.  TTE is normal.  LDL is 91, TG is 223.  She has DM with A1c 9.5%.   Her BP is very well controlled.  Non-smoker.    She was diagnosed with SLE 16 years ago after developing Malar rash, aphthous ulcers in the mouth, severe arthritis.  She had positive serologies.    She has had a lot of lumbar spine issues and is s/p 2 level fusion and instrumentation.  It is MRI compatible.    Past Medical History:  Diagnosis Date  . Anxiety   . Atypical chest pain    a. ETT 05/2014: normal stress test without evidence of myocardial ischemia or chest pain at peak stress  . Complication of anesthesia    migraine headache  . Depression   . Diabetes mellitus without complication ([place]) metformin   dx 2015  . Dyspnea    on exertion  . Fibromyalgia   . Headache   . Heart murmur    told back in 2007, but no mention with 2016 cardiac w/u  . Hypertension   . Lupus (systemic lupus erythematosus) ([place])   . Mitral regurgitation    a. 05/2014 echo: EF >55%, nl LV/RV sys fxn, mild MR/AR, no valvular stenosis   . Obesity (BMI 30-39.9)   . Raynaud's phenomenon   . Rheumatoid arthritis ([place])   . Sjogren's disease ([place])   . Spondylolisthesis of lumbar region   . Tobacco abuse    a. ongoing; b. ongoing SOB    Past Surgical History:  Procedure Laterality Date  . ABDOMINAL HYSTERECTOMY    . BACK SURGERY    . CESAREAN SECTION     x3  . LUMBAR LAMINECTOMY    . LUMBAR LAMINECTOMY/DECOMPRESSION MICRODISCECTOMY N/A 03/29/2017    Procedure: Re operative Right Lumbar Three-Four Laminectomy and discectomy with Left Lumbar Three-Four, Bilateral Lumbar Four-Five Laminectomy for decompression;  Surgeon: Kevan Ny Ditty, MD;  Location: [facility];  Service: Neurosurgery;  Laterality: N/A;  . SPINAL CORD STIMULATOR INSERTION  12/09/2017   Procedure: LUMBAR SPINAL CORD STIMULATOR IMPLANT;  Surgeon: Eustace Moore, MD;  Location: [facility];  Service: Neurosurgery;;  . SPINAL CORD STIMULATOR REMOVAL N/A 01/11/2018   Procedure: LUMBAR SPINAL CORD STIMULATOR REMOVAL;  Surgeon: Eustace Moore, MD;  Location: [facility];  Service: Neurosurgery;  Laterality: N/A;  . TUBAL LIGATION      Family History  Problem Relation Age of Onset  . Hypertension Mother   . Valvular heart disease Mother   . CAD Father 40       CABG x 4  . Liver cancer Father 70       passed  . CAD Paternal Aunt        CABG  . Breast cancer Paternal Aunt   . CAD Paternal Aunt        CABG  . Breast cancer Paternal Aunt   . CAD Paternal Aunt        CABG  . Breast cancer  Paternal Aunt   . CAD Paternal Aunt        CABG  . Breast cancer Paternal Aunt   . CAD Paternal Aunt        CABG  . Breast cancer Paternal Aunt   . CAD Paternal Uncle        MI s/p CABG  . Stroke Maternal Grandmother   . Stroke Maternal Grandfather   . CAD Maternal Grandfather     Social History:  reports that she quit smoking about 3 years ago. Her smoking use included cigarettes. She has a 5.00 pack-year smoking history. She has never used smokeless tobacco. She reports that she does not drink alcohol or use drugs.  Allergies:  Allergies  Allergen Reactions  . Pseudoephedrine Hcl Shortness Of Breath and Other (See Comments)    Room starts spinning, can't breath  . Ancef [Cefazolin] Other (See Comments)    Unknown  . Clindamycin/Lincomycin Hives  . Prednisone Other (See Comments)    Chest pain w/ cortisone shots or prednisone pills Elevated blood pressure  . Adhesive [Tape] Rash and  Other (See Comments)    Sensitivity to certain band aids and adhesives - burns skin, makes skin raw  . Other Rash and Other (See Comments)    Sensitivity to certain band aids and adhesives - burns skin, makes skin raw  . Sudafed [Pseudoephedrine Hcl] Other (See Comments)    Dizziness     Prior to Admission medications   Medication Sig Start Date End Date Taking? Authorizing Provider  amLODipine (NORVASC) 10 MG tablet Take 1 tablet once daily for blood pressure. Patient taking differently: Take 10 mg by mouth daily.  04/25/18  Yes Pleas Koch, NP  aspirin EC 81 MG tablet Take 81 mg by mouth daily.   Yes [provider]  buPROPion (WELLBUTRIN XL) 150 MG 24 hr tablet Take 1 tablet by mouth once daily for anxiety and depression. Patient taking differently: Take 150 mg by mouth daily.  04/25/18  Yes Pleas Koch, NP  clobetasol cream (TEMOVATE) 3.81 % Apply 1 application topically 2 (two) times daily as needed (for lupis rash).   Yes [provider]  escitalopram (LEXAPRO) 20 MG tablet TAKE 1 TABLET BY MOUTH EVERY DAY 12/15/18  Yes Pleas Koch, NP  folic acid (FOLVITE) 1 MG tablet Take 2 tablets by mouth daily. 01/31/19  Yes Ofilia Neas, PA-C  gabapentin (NEURONTIN) 300 MG capsule Take 600 mg by mouth 3 (three) times daily as needed (for pain).  11/02/16  Yes [provider]  hydroxychloroquine (PLAQUENIL) 200 MG tablet Take 1 tablet (200 mg total) by mouth 2 (two) times daily. 01/31/19  Yes Ofilia Neas, PA-C  Insulin Glargine (LANTUS) 100 UNIT/ML Solostar Pen Inject 12 Units into the skin every evening. 08/22/18  Yes Pleas Koch, NP  metFORMIN (GLUCOPHAGE-XR) 500 MG 24 hr tablet Take 2 tablets every morning with food for diabetes. Patient taking differently: Take 1,000 mg by mouth daily with breakfast.  04/25/18  Yes Pleas Koch, NP  methotrexate (RHEUMATREX) 2.5 MG tablet Take 8 tablets (20 mg total) by mouth once a week.  Caution:Chemotherapy. Protect from light. 01/31/19  Yes Ofilia Neas, PA-C  metoprolol tartrate (LOPRESSOR) 100 MG tablet Take 1 tablet by mouth twice daily for blood pressure. Patient taking differently: Take 100 mg by mouth 2 (two) times daily. Take 1 tablet by mouth twice daily for blood pressure. 04/25/18  Yes Pleas Koch, NP  rosuvastatin (  CRESTOR) 10 MG tablet Take 1 tablet (10 mg total) by mouth daily at 6 PM. Take 1 tablet by mouth at bedtime for cholesterol. 08/21/18  Yes Gladstone Lighter, MD  spironolactone (ALDACTONE) 25 MG tablet Take 25 mg by mouth daily.   Yes [provider]  valsartan-hydrochlorothiazide (DIOVAN-HCT) 320-25 MG tablet Take 1 tablet by mouth once daily for blood pressure. Patient taking differently: Take 1 tablet by mouth at bedtime.  04/25/18  Yes Pleas Koch, NP  Vitamin D, Ergocalciferol, (DRISDOL) 1.25 MG (50000 UT) CAPS capsule Take 1 capsule (50,000 Units total) by mouth 2 (two) times a week. 02/05/19  Yes Deveshwar, Abel Presto, MD  Blood Glucose Monitoring Suppl (ONETOUCH VERIO FLEX SYSTEM) w/Device KIT Use as instructed to test blood sugar 3 times daily 02/12/19   Pleas Koch, NP  cyclobenzaprine (FLEXERIL) 10 MG tablet Take 1 tablet (10 mg total) by mouth 3 (three) times daily as needed for muscle spasms. Patient not taking: Reported on 03/02/2019 08/01/18   Pleas Koch, NP  glucose blood test strip Use as instructed to blood sugar 3 times daily 02/12/19   Pleas Koch, NP  Halcinonide 0.1 % OINT Apply 1 application topically 2 (two) times daily as needed (for lupis rash).     [provider]  Insulin Pen Needle (PEN NEEDLES) 31G X 6 MM MISC Use with insulin as directed. 04/25/18   Pleas Koch, NP  pilocarpine (SALAGEN) 5 MG tablet Take 5 mg by mouth daily as needed (for dry mouth).     [provider]  tacrolimus (PROTOPIC) 0.1 % ointment Apply 1 application topically 2 (two) times daily as needed (for  lupis rash).     [provider]  Triamcinolone Acetonide (TRIAMCINOLONE 0.1 % CREAM : EUCERIN) CREA Apply 1 application topically 2 (two) times daily as needed for rash or irritation.     [provider]    Medications:  Scheduled: . amLODipine  10 mg Oral Daily  . buPROPion  150 mg Oral Daily  . clopidogrel  75 mg Oral Daily  . enoxaparin (LOVENOX) injection  40 mg Subcutaneous Q24H  . escitalopram  20 mg Oral Daily  . folic acid  2 mg Oral Daily  . irbesartan  300 mg Oral QHS   And  . hydrochlorothiazide  25 mg Oral QHS  . hydroxychloroquine  200 mg Oral BID  . insulin glargine  12 Units Subcutaneous QPM  . methotrexate  20 mg Oral Once per day on Fri  . rosuvastatin  10 mg Oral q1800  . sodium chloride flush  3 mL Intravenous Q12H  . spironolactone  25 mg Oral Daily  . [START ON 03/05/2019] Vitamin D (Ergocalciferol)  50,000 Units Oral Once per day on Mon Thu    Results for orders placed or performed during the hospital encounter of 03/02/19 (from the past 48 hour(s))  Glucose, capillary     Status: Abnormal   Collection Time: 03/02/19  1:56 PM  Result Value Ref Range   Glucose-Capillary 217 (H) 70 - 99 mg/dL  Protime-INR     Status: None   Collection Time: 03/02/19  2:04 PM  Result Value Ref Range   Prothrombin Time 12.8 11.4 - 15.2 seconds   INR 1.0 0.8 - 1.2    Comment: (NOTE) INR goal varies based on device and disease states. Performed at [facility], [address] [place], [place] [address]   APTT     Status: None  Collection Time: 03/02/19  2:04 PM  Result Value Ref Range   aPTT 27 24 - 36 seconds    Comment: Performed at [facility], [address] [place], [place] [address]  CBC     Status: Abnormal   Collection Time: 03/02/19  2:04 PM  Result Value Ref Range   WBC 6.5 4.0 - 10.5 K/uL   RBC 5.14 (H) 3.87 - 5.11 MIL/uL   Hemoglobin 13.8 12.0 - 15.0 g/dL   HCT 42.1 36.0 - 46.0 %   MCV 81.9 80.0 - 100.0 fL   MCH  26.8 26.0 - 34.0 pg   MCHC 32.8 30.0 - 36.0 g/dL   RDW 13.0 11.5 - 15.5 %   Platelets 365 150 - 400 K/uL   nRBC 0.0 0.0 - 0.2 %    Comment: Performed at [facility], [address] [place], [place] [address]  Differential     Status: None   Collection Time: 03/02/19  2:04 PM  Result Value Ref Range   Neutrophils Relative % 57 %   Neutro Abs 3.7 1.7 - 7.7 K/uL   Lymphocytes Relative 34 %   Lymphs Abs 2.2 0.7 - 4.0 K/uL   Monocytes Relative 6 %   Monocytes Absolute 0.4 0.1 - 1.0 K/uL   Eosinophils Relative 2 %   Eosinophils Absolute 0.1 0.0 - 0.5 K/uL   Basophils Relative 1 %   Basophils Absolute 0.1 0.0 - 0.1 K/uL   Immature Granulocytes 0 %   Abs Immature Granulocytes 0.01 0.00 - 0.07 K/uL    Comment: Performed at [facility], [address] [place], [place] [address]  Comprehensive metabolic panel     Status: Abnormal   Collection Time: 03/02/19  2:04 PM  Result Value Ref Range   Sodium 135 135 - 145 mmol/L   Potassium 3.5 3.5 - 5.1 mmol/L   Chloride 97 (L) 98 - 111 mmol/L   CO2 28 22 - 32 mmol/L   Glucose, Bld 230 (H) 70 - 99 mg/dL   BUN 9 6 - 20 mg/dL   Creatinine, Ser 0.63 0.44 - 1.00 mg/dL   Calcium 9.7 8.9 - 10.3 mg/dL   Total Protein 8.9 (H) 6.5 - 8.1 g/dL   Albumin 4.4 3.5 - 5.0 g/dL   AST 26 15 - 41 U/L   ALT 17 0 - 44 U/L   Alkaline Phosphatase 101 38 - 126 U/L   Total Bilirubin 1.0 0.3 - 1.2 mg/dL   GFR calc non Af Amer >60 >60 mL/min   GFR calc Af Amer >60 >60 mL/min   Anion gap 10 5 - 15    Comment: Performed at [facility], [address] [place], [place] [address]  Lipid panel     Status: Abnormal   Collection Time: 03/03/19  5:10 AM  Result Value Ref Range   Cholesterol 170 0 - 200 mg/dL   Triglycerides 223 (H) <150 mg/dL   HDL 34 (L) >40 mg/dL   Total CHOL/HDL Ratio 5.0 RATIO   VLDL 45 (H) 0 - 40 mg/dL   LDL Cholesterol 91 0 - 99 mg/dL    Comment:        Total Cholesterol/HDL:CHD Risk Coronary Heart Disease  Risk Table                     Men   Women  1/2 Average Risk   3.4   3.3  Average Risk  5.0   4.4  2 X Average Risk   9.6   7.1  3 X Average Risk  23.4   11.0        Use the calculated Patient Ratio above and the CHD Risk Table to determine the patient's CHD Risk.        ATP III CLASSIFICATION (LDL):  <100     mg/dL   Optimal  100-129  mg/dL   Near or Above                    Optimal  130-159  mg/dL   Borderline  160-189  mg/dL   High  >190     mg/dL   Very High Performed at [facility], [address] [place], [place] [address]   Hemoglobin A1c     Status: Abnormal   Collection Time: 03/03/19  5:10 AM  Result Value Ref Range   Hgb A1c MFr Bld 9.5 (H) 4.8 - 5.6 %    Comment: (NOTE) Pre diabetes:          5.7%-6.4% Diabetes:              >6.4% Glycemic control for   <7.0% adults with diabetes    Mean Plasma Glucose 225.95 mg/dL    Comment: Performed at [facility] [address], [place], [place] [address]  Basic metabolic panel     Status: Abnormal   Collection Time: 03/04/19  5:56 AM  Result Value Ref Range   Sodium 136 135 - 145 mmol/L   Potassium 3.8 3.5 - 5.1 mmol/L   Chloride 99 98 - 111 mmol/L   CO2 30 22 - 32 mmol/L   Glucose, Bld 207 (H) 70 - 99 mg/dL   BUN 16 6 - 20 mg/dL   Creatinine, Ser 0.91 0.44 - 1.00 mg/dL   Calcium 9.3 8.9 - 10.3 mg/dL   GFR calc non Af Amer >60 >60 mL/min   GFR calc Af Amer >60 >60 mL/min   Anion gap 7 5 - 15    Comment: Performed at [facility], [address], [place], [place] [address]  Magnesium     Status: None   Collection Time: 03/04/19  5:56 AM  Result Value Ref Range   Magnesium 2.0 1.7 - 2.4 mg/dL    Comment: Performed at [facility], [address] [place], [place] [address]    Dg Chest 2 View  Result Date: 03/02/2019 CLINICAL DATA:  Stroke. Some chest pain. Ex-smoker. EXAM: CHEST - 2 VIEW COMPARISON:  01/11/2018. FINDINGS: Normal sized heart. Clear lungs with normal  vascularity. Minimal peribronchial thickening. Minimal thoracic spine degenerative changes. IMPRESSION: Stable minimal bronchitic changes.  No acute abnormality. Electronically Signed   By: Claudie Revering M.D.   On: 03/02/2019 19:09   Ct Head Wo Contrast  Result Date: 03/02/2019 CLINICAL DATA:  Left-sided tingling and left facial droop. EXAM: CT HEAD WITHOUT CONTRAST TECHNIQUE: Contiguous axial images were obtained from the base of the skull through the vertex without intravenous contrast. COMPARISON:  Head CT 08/19/2018 FINDINGS: Brain: There is no mass, hemorrhage or extra-axial collection. The size and configuration of the ventricles and extra-axial CSF spaces are normal. There is hypoattenuation of the white matter, most commonly indicating chronic small vessel disease. Vascular: No abnormal hyperdensity of the major intracranial arteries or dural venous sinuses. No intracranial atherosclerosis. Skull: The visualized skull base, calvarium and extracranial soft tissues are normal. Sinuses/Orbits: No fluid levels or  advanced mucosal thickening of the visualized paranasal sinuses. No mastoid or middle ear effusion. The orbits are normal. IMPRESSION: 1. No acute intracranial abnormality. 2. Findings of chronic small vessel disease. Electronically Signed   By: Ulyses Jarred M.D.   On: 03/02/2019 14:47   Mr Brain Wo Contrast  Result Date: 03/02/2019 CLINICAL DATA:  LEFT sided weakness beginning at 4 a.m. History of diabetes, hypertension, lupus. EXAM: MRI HEAD WITHOUT CONTRAST MRA HEAD WITHOUT CONTRAST TECHNIQUE: Multiplanar, multiecho pulse sequences of the brain and surrounding structures were obtained without intravenous contrast. Angiographic images of the head were obtained using MRA technique without contrast. COMPARISON:  None. FINDINGS: MRI HEAD FINDINGS INTRACRANIAL CONTENTS: No reduced diffusion to suggest acute ischemia or hyperacute demyelination. No susceptibility artifact to suggest hemorrhage. No  parenchymal brain volume loss for age. No hydrocephalus. Scattered subcentimeter supratentorial white matter FLAIR T2 hyperintensities, greater than expected for age, a few of which are along the periventricular margin; however no T1 hypointensity associated with black holes of demyelination. No suspicious parenchymal signal, masses, mass effect. No abnormal extra-axial fluid collections. No extra-axial masses. VASCULAR: Normal major intracranial vascular flow voids present at skull base. SKULL AND UPPER CERVICAL SPINE: No abnormal sellar expansion. No suspicious calvarial bone marrow signal. Craniocervical junction maintained. SINUSES/ORBITS: The mastoid air-cells and included paranasal sinuses are well-aerated.The included ocular globes and orbital contents are non-suspicious. OTHER: None. MRA HEAD FINDINGS ANTERIOR CIRCULATION: Normal flow related enhancement of the included cervical, petrous, cavernous and supraclinoid internal carotid arteries. Patent anterior communicating artery. Patent anterior and middle cerebral arteries. No large vessel occlusion, flow limiting stenosis, or aneurysm. POSTERIOR CIRCULATION: LEFT vertebral artery is dominant, RIGHT vertebral artery terminates in the posterior inferior cerebellar artery. Vertebrobasilar arteries are patent, with normal flow related enhancement of the main branch vessels. Flow-limiting stenosis versus focally occluded LEFT P2 segment with thready reconstitution/collateralization. Bilateral posterior communicating arteries present. No large vessel occlusion, or aneurysm. ANATOMIC VARIANTS: None. Source data and MIP images were reviewed. IMPRESSION: MRI HEAD: 1. No acute intracranial process. 2. Mild white matter changes most commonly seen with chronic small vessel ischemic disease. MRA HEAD: 1. LEFT P2 flow-limiting stenosis versus focal occlusion with intermediate collaterals. Electronically Signed   By: Elon Alas M.D.   On: 03/02/2019 23:16   US  Carotid Bilateral (at [facility] Only)  Result Date: 03/03/2019 CLINICAL DATA:  CVA. EXAM: BILATERAL CAROTID DUPLEX ULTRASOUND TECHNIQUE: Pearline Cables scale imaging, color Doppler and duplex ultrasound were performed of bilateral carotid and vertebral arteries in the neck. COMPARISON:  08/19/2018 FINDINGS: Criteria: Quantification of carotid stenosis is based on velocity parameters that correlate the residual internal carotid diameter with NASCET-based stenosis levels, using the diameter of the distal internal carotid lumen as the denominator for stenosis measurement. The following velocity measurements were obtained: RIGHT ICA: 105 cm/sec CCA: 84 cm/sec SYSTOLIC ICA/CCA RATIO:  1.2 ECA: 112 cm/sec LEFT ICA: 119 cm/sec CCA: 681 cm/sec SYSTOLIC ICA/CCA RATIO:  1.1 ECA: 98 cm/sec RIGHT CAROTID ARTERY: Echogenic plaque at the right carotid bulb without significant stenosis. External carotid artery is patent with normal waveform. Normal waveforms and velocities in the internal carotid artery. RIGHT VERTEBRAL ARTERY: Antegrade flow and normal waveform in the right vertebral artery. LEFT CAROTID ARTERY: Minimal echogenic plaque at the left carotid bulb without significant stenosis. External carotid artery is patent with normal waveform. Minimal plaque in the proximal internal carotid artery. Normal waveforms and velocities in the internal carotid artery. LEFT VERTEBRAL ARTERY: Antegrade flow and normal waveform in  the left vertebral artery. 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 Estimated degree of stenosis in the internal carotid arteries is less than 50% bilaterally. Patent vertebral arteries with antegrade flow. Electronically Signed   By: Markus Daft M.D.   On: 03/03/2019 09:40   Mr Jodene Nam Head/brain GY Cm  Result Date: 03/02/2019 CLINICAL DATA:  LEFT sided weakness beginning at 4 a.m. History of diabetes, hypertension, lupus. EXAM: MRI HEAD WITHOUT  CONTRAST MRA HEAD WITHOUT CONTRAST TECHNIQUE: Multiplanar, multiecho pulse sequences of the brain and surrounding structures were obtained without intravenous contrast. Angiographic images of the head were obtained using MRA technique without contrast. COMPARISON:  None. FINDINGS: MRI HEAD FINDINGS INTRACRANIAL CONTENTS: No reduced diffusion to suggest acute ischemia or hyperacute demyelination. No susceptibility artifact to suggest hemorrhage. No parenchymal brain volume loss for age. No hydrocephalus. Scattered subcentimeter supratentorial white matter FLAIR T2 hyperintensities, greater than expected for age, a few of which are along the periventricular margin; however no T1 hypointensity associated with black holes of demyelination. No suspicious parenchymal signal, masses, mass effect. No abnormal extra-axial fluid collections. No extra-axial masses. VASCULAR: Normal major intracranial vascular flow voids present at skull base. SKULL AND UPPER CERVICAL SPINE: No abnormal sellar expansion. No suspicious calvarial bone marrow signal. Craniocervical junction maintained. SINUSES/ORBITS: The mastoid air-cells and included paranasal sinuses are well-aerated.The included ocular globes and orbital contents are non-suspicious. OTHER: None. MRA HEAD FINDINGS ANTERIOR CIRCULATION: Normal flow related enhancement of the included cervical, petrous, cavernous and supraclinoid internal carotid arteries. Patent anterior communicating artery. Patent anterior and middle cerebral arteries. No large vessel occlusion, flow limiting stenosis, or aneurysm. POSTERIOR CIRCULATION: LEFT vertebral artery is dominant, RIGHT vertebral artery terminates in the posterior inferior cerebellar artery. Vertebrobasilar arteries are patent, with normal flow related enhancement of the main branch vessels. Flow-limiting stenosis versus focally occluded LEFT P2 segment with thready reconstitution/collateralization. Bilateral posterior communicating  arteries present. No large vessel occlusion, or aneurysm. ANATOMIC VARIANTS: None. Source data and MIP images were reviewed. IMPRESSION: MRI HEAD: 1. No acute intracranial process. 2. Mild white matter changes most commonly seen with chronic small vessel ischemic disease. MRA HEAD: 1. LEFT P2 flow-limiting stenosis versus focal occlusion with intermediate collaterals. Electronically Signed   By: Elon Alas M.D.   On: 03/02/2019 23:16    ROS Blood pressure (!) 101/52, pulse (!) 51, temperature 98.4 F (36.9 C), temperature source Oral, resp. rate 20, height _0  (1.676 m), weight 99.3 kg, SpO2 95 %. Neurologic Examination:   Awake, alert, fully oriented. Language-fluent.  Comprehension, naming, repetition- intact. Face symmetric. Tongue midline.  EOMI.  PERL. Strength 5/5 BUE and BLE with some weakness in LLE due to prior lumbar issues.  No pronator drift. Sensory- reduced touch and pinprick in the left face and arm.   Coord - intact bilaterally to FTN. Gait- deferred. No babinski.  No hoffman's.  Assessment/Plan:  This patient is continuing to have left sided sensory disturbance beyond the time frame of what is expected for an TIA.  No acute stroke identified on imaging.  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  I will check antibody to Ribosomal P which is often seen in CNS lupus.  I will check other lupus serologies as well.  The lesions visible in the brain may be small vessel ischemic disease related as radiologist asserts, however, they are somewhat larger  in size than expected for that.  They can be related to previous CNS lupus cerebritis lesions.  They do resemble possible demyelinating lesions too, although less likely that she would have both MS and SLE.   I will do an MRI Cervical and Thoracic spine to see if any other lesions present which may indicate demyelination, although we have  to keep in mind that SLE can also affect the spine.  If lesions are present in the spine, then I would recommend a spinal tap to assess for CSF Oligoclonal bands and IgG Index.    Rogue Jury, MS, MD 03/04/2019, 11:57 AM

## 2019-03-04 NOTE — Progress Notes (Signed)
[facility] NAME: Lauren Winters    MR#:  [id number]  DATE OF BIRTH:  November 05, 1966  SUBJECTIVE:  CHIEF COMPLAINT:   Chief Complaint  Patient presents with  . Numbness     Patient initially admitted with left-sided numbness and facial droop.  Had an episode of headaches this morning which is resolved at the time of my evaluation this morning.   Awaiting neurology evaluation. REVIEW OF SYSTEMS:  Review of Systems  Constitutional: Negative for chills and fever.  HENT: Negative for hearing loss and tinnitus.   Eyes: Negative for blurred vision, double vision and photophobia.  Respiratory: Negative for cough and shortness of breath.   Cardiovascular: Negative for chest pain and palpitations.  Gastrointestinal: Negative for abdominal pain, heartburn, nausea and vomiting.  Genitourinary: Negative for dysuria and urgency.  Musculoskeletal: Negative for myalgias and neck pain.  Skin: Negative for itching and rash.  Neurological: Positive for headaches. Negative for dizziness and speech change.  Psychiatric/Behavioral: Negative for depression and substance abuse.    DRUG ALLERGIES:   Allergies  Allergen Reactions  . Pseudoephedrine Hcl Shortness Of Breath and Other (See Comments)    Room starts spinning, can't breath  . Ancef [Cefazolin] Other (See Comments)    Unknown  . Clindamycin/Lincomycin Hives  . Prednisone Other (See Comments)    Chest pain w/ cortisone shots or prednisone pills Elevated blood pressure  . Adhesive [Tape] Rash and Other (See Comments)    Sensitivity to certain band aids and adhesives - burns skin, makes skin raw  . Other Rash and Other (See Comments)    Sensitivity to certain band aids and adhesives - burns skin, makes skin raw  . Sudafed [Pseudoephedrine Hcl] Other (See Comments)    Dizziness    VITALS:  Blood pressure (!) 101/52, pulse (!) 51, temperature 98.4 F (36.9 C), temperature source Oral,  resp. rate 20, height 5\' 6"  (1.676 m), weight 99.3 kg, SpO2 95 %. PHYSICAL EXAMINATION:  Physical Exam  GENERAL:  53 y.o.-year-old patient lying in the bed with no acute distress.  EYES: Pupils equal, round, reactive to light and accommodation. No scleral icterus. Extraocular muscles intact.  HEENT: Head atraumatic, normocephalic. Oropharynx and nasopharynx clear.  NECK:  Supple, no jugular venous distention. No thyroid enlargement, no tenderness.  LUNGS: Normal breath sounds bilaterally, no wheezing, rales,rhonchi or crepitation. No use of accessory muscles of respiration.  CARDIOVASCULAR: S1, S2 normal. No murmurs, rubs, or gallops.  ABDOMEN: Soft, nontender, nondistended. Bowel sounds present. No organomegaly or mass.  EXTREMITIES: No pedal edema, cyanosis, or clubbing.  NEUROLOGIC: Cranial nerves II through XII are intact. Muscle strength 4/5 in all extremities. Sensation intact. Gait not checked.  PSYCHIATRIC: The patient is alert and oriented x 3.  SKIN: No obvious rash, lesion, or ulcer.  LABORATORY PANEL:  Female CBC Recent Labs  Lab 03/02/19 1404  WBC 6.5  HGB 13.8  HCT 42.1  PLT 365   ------------------------------------------------------------------------------------------------------------------ Chemistries  Recent Labs  Lab 03/02/19 1404 03/04/19 0556  NA 135 136  K 3.5 3.8  CL 97* 99  CO2 28 30  GLUCOSE 230* 207*  BUN 9 16  CREATININE 0.63 0.91  CALCIUM 9.7 9.3  MG  --  2.0  AST 26  --   ALT 17  --   ALKPHOS 101  --   BILITOT 1.0  --    RADIOLOGY:  No results found. ASSESSMENT AND PLAN:   1. Acute left-sided  paresthesias with facial droop Patient had MRI of the brain done already with no acute CVA.  MRA head done reveale LEFT P2 flow-limiting stenosis versus focal occlusion with intermediate collaterals. Symptoms said to be in similar distribution to previous stroke. Already initiated CVA/TIA protocol.  2D echocardiogram done with normal ejection  fraction of 60 to 65%.  Cavity size was normal.  Carotid Doppler ultrasound done with less than 50% stenosis bilaterally.  Vertebral arteries patent with antegrade flow. Seen by occupational therapist.  Home health with OT recommended.  Seen by speech therapist.  No needs identified.  Seen by physical therapist.  Home health with physical therapy with intermittent supervision recommended.  Patient has chronic back pains and uses rolling walker at baseline.  Continue using the same on discharge. Aspirin was discontinued since patient had symptoms while on aspirin.  Patient currently on Plavix and statins.  Lipid panel done with LDL of 91. I discussed case with neurologist on call today Dr. Orlena Sheldon who will see patient in consultation.  2. Chronic tobacco smoker abuse/dependency Nicotine patch cessation counseling ordered  3. Chronic benign essential hypertension Stable Continue home regiment  4. Acute hyperglycemia with chronic diabetes mellitus type 2 Hold metformin while in house, sliding scale insulin with Accu-Cheks per routine, continue Lantus, statin therapy.  Recent glycosylated hemoglobin level of 5.2.  DVT prophylaxis; Lovenox  Disposition; anticipate discharge home with home health with physical therapy in a.m. if no further work-up recommended by neurologist.  All the records are reviewed and case discussed with Care Management/Social Worker. Management plans discussed with the patient, family and they are in agreement.  CODE STATUS: Full Code  TOTAL TIME TAKING CARE OF THIS PATIENT: 25 minutes.   More than 50% of the time was spent in counseling/coordination of care: YES  POSSIBLE D/C IN 1 DAY, DEPENDING ON CLINICAL CONDITION.   Skiler Tye M.D on 03/04/2019 at 11:10 AM  Between 7am to 6pm - Pager - [phone]  After 6pm go to [email or web address] - Proofreader  [facility]  Office  [phone]  CC: Primary care physician; Pleas Koch, NP  Note: This dictation was prepared with Dragon dictation along with smaller phrase technology. Any transcriptional errors that result from this process are unintentional.

## 2019-03-04 NOTE — Progress Notes (Signed)
I reviewed MRI Cervical and Thoracic spine.  There is no evidence of intrinsic spinal cord lesion to suggest demyelination.  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    Ultimately, treating the underlying SLE more effectively would reduce CNS Lupus activity.  Consideration can be given to using IV Solumedrol 1000 mg qd for 3 days as a way to suppress presumed CNS Lupus inflammatory activity if she is not better by tomorrow.     Rogue Jury, MS, MD

## 2019-03-04 NOTE — Progress Notes (Addendum)
Pt reports transient sharp pains in right top of head and left face which resolved. Reports parathesias in left leg less with continued left face and arm the same. Chronic left sided weakness. Neurology consult done with stroke ruled out with orders updated. MRI being done. Husband in to see pt. Noted bilateral pupil overall size large, but reactive with no changes x 2 days.

## 2019-03-05 DIAGNOSIS — R209 Unspecified disturbances of skin sensation: Secondary | ICD-10-CM

## 2019-03-05 LAB — TSH: TSH: 1.248 u[IU]/mL (ref 0.350–4.500)

## 2019-03-05 LAB — GLUCOSE, CAPILLARY
GLUCOSE-CAPILLARY: 162 mg/dL — AB (ref 70–99)
Glucose-Capillary: 238 mg/dL — ABNORMAL HIGH (ref 70–99)
Glucose-Capillary: 150 mg/dL — ABNORMAL HIGH (ref 70–99)

## 2019-03-05 LAB — VITAMIN B12: Vitamin B-12: 196 pg/mL (ref 180–914)

## 2019-03-05 MED ORDER — GABAPENTIN 300 MG PO CAPS
600.0000 mg | ORAL_CAPSULE | Freq: Two times a day (BID) | ORAL | Status: DC
  Administered 2019-03-05 – 2019-03-06 (×2): 600 mg via ORAL
  Filled 2019-03-05 (×2): qty 2

## 2019-03-05 MED ORDER — INSULIN GLARGINE 100 UNIT/ML ~~LOC~~ SOLN
16.0000 [IU] | Freq: Every evening | SUBCUTANEOUS | Status: DC
  Administered 2019-03-05: 16 [IU] via SUBCUTANEOUS
  Filled 2019-03-05 (×2): qty 0.16

## 2019-03-05 MED ORDER — INSULIN ASPART 100 UNIT/ML ~~LOC~~ SOLN: 0.0000 [IU] | Freq: Every day | SUBCUTANEOUS | Status: DC

## 2019-03-05 MED ORDER — INSULIN ASPART 100 UNIT/ML ~~LOC~~ SOLN
0.0000 [IU] | Freq: Three times a day (TID) | SUBCUTANEOUS | Status: DC
  Administered 2019-03-05: 2 [IU] via SUBCUTANEOUS
  Administered 2019-03-05: 13:00:00 3 [IU] via SUBCUTANEOUS
  Administered 2019-03-06: 08:00:00 2 [IU] via SUBCUTANEOUS
  Administered 2019-03-06: 12:00:00 7 [IU] via SUBCUTANEOUS
  Filled 2019-03-05 (×4): qty 1

## 2019-03-05 NOTE — Progress Notes (Signed)
Pt stated she has had a fall with in the last month. D/t reported "back problems and description of sensation in left arm as painful and prickly I asked pt if MD was aware that fall had occurred. Pt reported that MD is aware that a fall as occurred within the last month.  Pt expressed some confusion around treatment plan and decreasing of gabapentin dosage. Reinforced treatment plan discussed in L. Reynolds, MD note; to include steroids not being given d/t reported allergy, plasmapheresis will not be given at this time and that gabapentin adjustment is to observe for any changes in symptoms.

## 2019-03-05 NOTE — Progress Notes (Addendum)
Inpatient Diabetes Program Recommendations  AACE/ADA: New Consensus Statement on Inpatient Glycemic Control  Target Ranges:  Prepandial:   less than 140 mg/dL      Peak postprandial:   less than 180 mg/dL (1-2 hours)      Critically ill patients:  140 - 180 mg/dL   Results for KAJA, JACKOWSKI (MRN [id number]) as of 03/05/2019 09:27  Ref. Range 03/04/2019 05:56  Glucose Latest Ref Range: 70 - 99 mg/dL 207 (H)   Results for DACOTA, DEVALL (MRN [id number]) as of 03/05/2019 09:27  Ref. Range 01/31/2019 13:12 03/03/2019 05:10  Hemoglobin A1C Latest Ref Range: 4.8 - 5.6 % 5.2 9.5 (H)   Review of Glycemic Control  Diabetes history: DM2 Outpatient Diabetes medications: Lantus 12 units QPM, Metformin XR 1000 mg QAM Current orders for Inpatient glycemic control: Lantus 12 units QPM  Inpatient Diabetes Program Recommendations:   Correction (SSI): Please consider ordering CBGs with Novolog 0-9 units TID with meals and Novolog 0-5 units QHS.  HgbA1C: A1C 9.5% on 03/03/19 indicating an average glucose of 226 mg/dl over the past 2-3 months. Patient reports that her glucose is consistently over 200 mg/dl at home.  Anticipate patient will need adjustments with outpatient DM medications to improve DM control.  Addendum 03/05/19-Spoke with patient about diabetes and home regimen for diabetes control. Patient reports that she is followed by PCP for diabetes management and currently she takes Lantus 12 units QPM, Metformin XR 1000 mg QAM as an outpatient for diabetes control. Patient reports that she is taking DM medications as prescribed. Patient states that she checks glucose 2-3 times per day and that it is usually in the 200's mg/dl in the mornings and throughout the day.  Discussed A1C results (9.5% on 03/03/19) and explained that current A1C indicates an average glucose of 226 mg/dl over the past 2-3 months. Noted prior A1C of 5.2% on 01/31/19 and patient states that she feels that prior A1C was not accurate because  her A1C has never been that low and her glucose is staying in the 200's mg/dl.  Explained that current A1C correlates more with noted glucose as an inpatient and reported glucose trends as an outpatient.  Discussed glucose and A1C goals. Discussed importance of checking CBGs and maintaining good CBG control to prevent long-term and short-term complications. Explained how hyperglycemia leads to damage within blood vessels which lead to the common complications seen with uncontrolled diabetes. Stressed to the patient the importance of improving glycemic control to prevent further complications from uncontrolled diabetes. Discussed impact of nutrition, exercise, stress, sickness, and medications on diabetes control. Anticipate patient will need additional adjustments with DM medications to improve DM control. Patient states that she is taking Metformin XR but notes that she has 3 episodes of diarrhea per day and abdominal cramping; therefore she does not want to increase Metformin dose. Patient is open to having Lantus dose increased and adding additional oral DM medications if needed to improve DM control.   Encouraged patient to check  glucose 3-4 times per day (before meals and at bedtime) and to follow up with PCP regarding DM control. Patient verbalized understanding of information discussed and she states that she has no further questions at this time related to diabetes.  Thanks, Barnie Alderman, RN, MSN, CDE Diabetes Coordinator Inpatient Diabetes Program [phone] (Team Pager from 8am to 5pm)

## 2019-03-05 NOTE — Progress Notes (Signed)
Subjective: Patient reports that he left arm numbness is worsening today.  Work up to date has been unremarkable.    Objective: Current vital signs: BP 128/66 (BP Location: Left Arm)   Pulse 66   Temp 98 F (36.7 C) (Oral)   Resp 17   Ht 5\' 6"  (1.676 m)   Wt 99.3 kg   SpO2 97%   BMI 35.35 kg/m  Vital signs in last 24 hours: Temp:  [98 F (36.7 C)-98.3 F (36.8 C)] 98 F (36.7 C) (03/09 1011) Pulse Rate:  [58-66] 66 (03/09 1011) Resp:  [17-20] 17 (03/09 1011) BP: (106-128)/(44-66) 128/66 (03/09 1011) SpO2:  [95 %-97 %] 97 % (03/09 1011)  Intake/Output from previous day: 03/08 0701 - 03/09 0700 In: 480 [P.O.:480] Out: -  Intake/Output this shift: Total I/O In: 480 [P.O.:480] Out: -  Nutritional status:  Diet Order            Diet Heart Room service appropriate? Yes; Fluid consistency: Thin  Diet effective now              Neurologic Exam: Mental Status: Alert, oriented, thought content appropriate.  Speech fluent without evidence of aphasia.  Able to follow 3 step commands without difficulty. Cranial Nerves: II: Discs flat bilaterally; Visual fields grossly normal, pupils equal, round, reactive to light and accommodation III,IV, VI: ptosis not present, extra-ocular motions intact bilaterally V,VII: smile symmetric, facial light touch sensation normal bilaterally VIII: hearing normal bilaterally IX,X: gag reflex present XI: bilateral shoulder shrug XII: midline tongue extension Motor: Able to lift all extremities against gravity.   Sensory: Reduced pinprick in the feet and left hand   Lab Results: Basic Metabolic Panel: Recent Labs  Lab 03/02/19 1404 03/04/19 0556  NA 135 136  K 3.5 3.8  CL 97* 99  CO2 28 30  GLUCOSE 230* 207*  BUN 9 16  CREATININE 0.63 0.91  CALCIUM 9.7 9.3  MG  --  2.0    Liver Function Tests: Recent Labs  Lab 03/02/19 1404  AST 26  ALT 17  ALKPHOS 101  BILITOT 1.0  PROT 8.9*  ALBUMIN 4.4   No results for input(s):  LIPASE, AMYLASE in the last 168 hours. No results for input(s): AMMONIA in the last 168 hours.  CBC: Recent Labs  Lab 03/02/19 1404  WBC 6.5  NEUTROABS 3.7  HGB 13.8  HCT 42.1  MCV 81.9  PLT 365    Cardiac Enzymes: No results for input(s): CKTOTAL, CKMB, CKMBINDEX, TROPONINI in the last 168 hours.  Lipid Panel: Recent Labs  Lab 03/03/19 0510  CHOL 170  TRIG 223*  HDL 34*  CHOLHDL 5.0  VLDL 45*  LDLCALC 91    CBG: Recent Labs  Lab 03/02/19 1356 03/05/19 1152  GLUCAP 217* 238*    Microbiology: Results for orders placed or performed during the hospital encounter of 05/17/18  Surgical pcr screen     Status: None   Collection Time: 05/17/18  1:32 PM  Result Value Ref Range Status   MRSA, PCR NEGATIVE NEGATIVE Final   Staphylococcus aureus NEGATIVE NEGATIVE Final    Comment: (NOTE) The Xpert SA Assay (FDA approved for NASAL specimens in patients 17 years of age and older), is one component of a comprehensive surveillance program. It is not intended to diagnose infection nor to guide or monitor treatment. Performed at [facility], [place] [address], [place], [place] [address]     Coagulation Studies: Recent Labs    03/02/19 1404  LABPROT 12.8  INR 1.0    Imaging: Mr Cervical Spine W Wo Contrast  Result Date: 03/04/2019 CLINICAL DATA:  Persistent left-sided weakness and sensory loss. History of lupus. Evaluate for cord demyelinating lesions. EXAM: MRI CERVICAL AND THORACIC SPINE WITH AND WITHOUT CONTRAST TECHNIQUE: Multiplanar and multiecho pulse sequences of the cervical spine, to include the craniocervical junction and cervicothoracic junction, and thoracic spine, were obtained with and without intravenous contrast. CONTRAST:  9 mL Gadavist intravenous contrast. COMPARISON:  MRI thoracic spine dated October 26, 2017. MRI cervical spine dated March 15, 2017. FINDINGS: MRI CERVICAL SPINE FINDINGS Alignment: Straightening of the normal cervical lordosis.  Vertebrae: No fracture, evidence of discitis, or bone lesion. Cord: Normal signal and morphology.  No intradural enhancement. Posterior Fossa, vertebral arteries, paraspinal tissues: Negative. Disc levels: C2-C3:  Negative. C3-C4: Negative disc. Mild bilateral uncovertebral hypertrophy. No stenosis. C4-C5:  Minimal disc bulging.  No stenosis. C5-C6: Unchanged diffuse disc bulging and mild bilateral uncovertebral hypertrophy resulting in mild-to-moderate spinal canal stenosis and mild to moderate bilateral neuroforaminal stenosis. C6-C7: Unchanged shallow left paracentral disc protrusion. No stenosis. C7-T1:  Negative. MRI THORACIC SPINE FINDINGS Alignment:  Physiologic. Vertebrae: No fracture, evidence of discitis, or bone lesion. Cord:  Normal signal and morphology.  No intradural enhancement. Paraspinal and other soft tissues: Negative. Disc levels: No significant disc bulge or herniation. No spinal canal or neuroforaminal stenosis. IMPRESSION: 1. Normal cervicothoracic spinal cord. No evidence of demyelinating disease. 2. Unchanged cervical spondylosis with mild-to-moderate stenosis at C5-C6. Electronically Signed   By: Titus Dubin M.D.   On: 03/04/2019 17:26   Mr Thoracic Spine W Wo Contrast  Result Date: 03/04/2019 CLINICAL DATA:  Persistent left-sided weakness and sensory loss. History of lupus. Evaluate for cord demyelinating lesions. EXAM: MRI CERVICAL AND THORACIC SPINE WITH AND WITHOUT CONTRAST TECHNIQUE: Multiplanar and multiecho pulse sequences of the cervical spine, to include the craniocervical junction and cervicothoracic junction, and thoracic spine, were obtained with and without intravenous contrast. CONTRAST:  9 mL Gadavist intravenous contrast. COMPARISON:  MRI thoracic spine dated October 26, 2017. MRI cervical spine dated March 15, 2017. FINDINGS: MRI CERVICAL SPINE FINDINGS Alignment: Straightening of the normal cervical lordosis. Vertebrae: No fracture, evidence of discitis, or bone  lesion. Cord: Normal signal and morphology.  No intradural enhancement. Posterior Fossa, vertebral arteries, paraspinal tissues: Negative. Disc levels: C2-C3:  Negative. C3-C4: Negative disc. Mild bilateral uncovertebral hypertrophy. No stenosis. C4-C5:  Minimal disc bulging.  No stenosis. C5-C6: Unchanged diffuse disc bulging and mild bilateral uncovertebral hypertrophy resulting in mild-to-moderate spinal canal stenosis and mild to moderate bilateral neuroforaminal stenosis. C6-C7: Unchanged shallow left paracentral disc protrusion. No stenosis. C7-T1:  Negative. MRI THORACIC SPINE FINDINGS Alignment:  Physiologic. Vertebrae: No fracture, evidence of discitis, or bone lesion. Cord:  Normal signal and morphology.  No intradural enhancement. Paraspinal and other soft tissues: Negative. Disc levels: No significant disc bulge or herniation. No spinal canal or neuroforaminal stenosis. IMPRESSION: 1. Normal cervicothoracic spinal cord. No evidence of demyelinating disease. 2. Unchanged cervical spondylosis with mild-to-moderate stenosis at C5-C6. Electronically Signed   By: Titus Dubin M.D.   On: 03/04/2019 17:26    Medications:  I have reviewed the patient's current medications. Scheduled: . amLODipine  10 mg Oral Daily  . buPROPion  150 mg Oral Daily  . clopidogrel  75 mg Oral Daily  . enoxaparin (LOVENOX) injection  40 mg Subcutaneous Q24H  . escitalopram  20 mg Oral Daily  . folic acid  2 mg Oral Daily  .  irbesartan  300 mg Oral QHS   And  . hydrochlorothiazide  25 mg Oral QHS  . hydroxychloroquine  200 mg Oral BID  . insulin aspart  0-5 Units Subcutaneous QHS  . insulin aspart  0-9 Units Subcutaneous TID WC  . insulin glargine  12 Units Subcutaneous QPM  . methotrexate  20 mg Oral Once per day on Fri  . rosuvastatin  10 mg Oral q1800  . sodium chloride flush  3 mL Intravenous Q12H  . spironolactone  25 mg Oral Daily  . Vitamin D (Ergocalciferol)  50,000 Units Oral Once per day on Mon Thu     Assessment/Plan: Although paresthesias have improved from initial presentation they are worsening somewhat today.  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  MRI of the brain, cervical and thoracic spines show no acute lesions.  Much of lab work is pending.  There was some thought to patient starting high dose steroids but patient reports she has had reactions to immunosuppressants in the past, particularly steroids.  Although plasmaphoresis remains an option would not like to employ that unless a true diagnosis for the paresthesias is known.  Currently on Neurontin.  May benefit from its adjustment for symptom control.    Recommendations: 1. TSH, B1, heavy metal screen, B12 2.  Change Neurontin to 600mg  BID.  If symptoms improve and patient tolerates would discharge patient.  Patient in the process of being scheduled for neurology evaluation as an outpatient at [facility].       LOS: 1 day   Alexis Goodell, MD Neurology [phone] 03/05/2019  2:06 PM

## 2019-03-05 NOTE — Progress Notes (Signed)
[facility] [place] NAME: Lauren Winters    MR#:  [id number]  DATE OF BIRTH:  Dec 01, 1966  SUBJECTIVE:  CHIEF COMPLAINT:   Chief Complaint  Patient presents with  . Numbness     Patient initially admitted with left-sided numbness and facial droop.  Intermittent episodes of headaches which appear resolved.   This morning patient reported improvement in left facial numbness.  Still having some numbness in the left upper extremity.  Was initially asking about discharge this morning but later agreeable to staying for neurologist to reevaluate patient later today.    REVIEW OF SYSTEMS:  Review of Systems  Constitutional: Negative for chills and fever.  HENT: Negative for hearing loss and tinnitus.   Eyes: Negative for blurred vision, double vision and photophobia.  Respiratory: Negative for cough and shortness of breath.   Cardiovascular: Negative for chest pain and palpitations.  Gastrointestinal: Negative for abdominal pain, heartburn, nausea and vomiting.  Genitourinary: Negative for dysuria and urgency.  Musculoskeletal: Negative for myalgias and neck pain.  Skin: Negative for itching and rash.  Neurological: Positive for headaches. Negative for dizziness and speech change.  Psychiatric/Behavioral: Negative for depression and substance abuse.    DRUG ALLERGIES:   Allergies  Allergen Reactions  . Pseudoephedrine Hcl Shortness Of Breath and Other (See Comments)    Room starts spinning, can't breath  . Ancef [Cefazolin] Other (See Comments)    Unknown  . Clindamycin/Lincomycin Hives  . Prednisone Other (See Comments)    Chest pain w/ cortisone shots or prednisone pills Elevated blood pressure  . Adhesive [Tape] Rash and Other (See Comments)    Sensitivity to certain band aids and adhesives - burns skin, makes skin raw  . Other Rash and Other (See Comments)    Sensitivity to certain band aids and adhesives - burns skin, makes skin  raw  . Sudafed [Pseudoephedrine Hcl] Other (See Comments)    Dizziness    VITALS:  Blood pressure 128/66, pulse 66, temperature 98 F (36.7 C), temperature source Oral, resp. rate 17, height 5\' 6"  (1.676 m), weight 99.3 kg, SpO2 97 %. PHYSICAL EXAMINATION:  Physical Exam  GENERAL:  53 y.o.-year-old patient lying in the bed with no acute distress.  EYES: Pupils equal, round, reactive to light and accommodation. No scleral icterus. Extraocular muscles intact.  HEENT: Head atraumatic, normocephalic. Oropharynx and nasopharynx clear.  NECK:  Supple, no jugular venous distention. No thyroid enlargement, no tenderness.  LUNGS: Normal breath sounds bilaterally, no wheezing, rales,rhonchi or crepitation. No use of accessory muscles of respiration.  CARDIOVASCULAR: S1, S2 normal. No murmurs, rubs, or gallops.  ABDOMEN: Soft, nontender, nondistended. Bowel sounds present. No organomegaly or mass.  EXTREMITIES: No pedal edema, cyanosis, or clubbing.  NEUROLOGIC: Cranial nerves II through XII are intact. Muscle strength 4/5 in all extremities. Sensation intact. Gait not checked.  PSYCHIATRIC: The patient is alert and oriented x 3.  SKIN: No obvious rash, lesion, or ulcer.  LABORATORY PANEL:  Female CBC Recent Labs  Lab 03/02/19 1404  WBC 6.5  HGB 13.8  HCT 42.1  PLT 365   ------------------------------------------------------------------------------------------------------------------ Chemistries  Recent Labs  Lab 03/02/19 1404 03/04/19 0556  NA 135 136  K 3.5 3.8  CL 97* 99  CO2 28 30  GLUCOSE 230* 207*  BUN 9 16  CREATININE 0.63 0.91  CALCIUM 9.7 9.3  MG  --  2.0  AST 26  --   ALT 17  --  ALKPHOS 101  --   BILITOT 1.0  --    RADIOLOGY:  Mr Cervical Spine W Wo Contrast  Result Date: 03/04/2019 CLINICAL DATA:  Persistent left-sided weakness and sensory loss. History of lupus. Evaluate for cord demyelinating lesions. EXAM: MRI CERVICAL AND THORACIC SPINE WITH AND WITHOUT  CONTRAST TECHNIQUE: Multiplanar and multiecho pulse sequences of the cervical spine, to include the craniocervical junction and cervicothoracic junction, and thoracic spine, were obtained with and without intravenous contrast. CONTRAST:  9 mL Gadavist intravenous contrast. COMPARISON:  MRI thoracic spine dated October 26, 2017. MRI cervical spine dated March 15, 2017. FINDINGS: MRI CERVICAL SPINE FINDINGS Alignment: Straightening of the normal cervical lordosis. Vertebrae: No fracture, evidence of discitis, or bone lesion. Cord: Normal signal and morphology.  No intradural enhancement. Posterior Fossa, vertebral arteries, paraspinal tissues: Negative. Disc levels: C2-C3:  Negative. C3-C4: Negative disc. Mild bilateral uncovertebral hypertrophy. No stenosis. C4-C5:  Minimal disc bulging.  No stenosis. C5-C6: Unchanged diffuse disc bulging and mild bilateral uncovertebral hypertrophy resulting in mild-to-moderate spinal canal stenosis and mild to moderate bilateral neuroforaminal stenosis. C6-C7: Unchanged shallow left paracentral disc protrusion. No stenosis. C7-T1:  Negative. MRI THORACIC SPINE FINDINGS Alignment:  Physiologic. Vertebrae: No fracture, evidence of discitis, or bone lesion. Cord:  Normal signal and morphology.  No intradural enhancement. Paraspinal and other soft tissues: Negative. Disc levels: No significant disc bulge or herniation. No spinal canal or neuroforaminal stenosis. IMPRESSION: 1. Normal cervicothoracic spinal cord. No evidence of demyelinating disease. 2. Unchanged cervical spondylosis with mild-to-moderate stenosis at C5-C6. Electronically Signed   By: Titus Dubin M.D.   On: 03/04/2019 17:26   Mr Thoracic Spine W Wo Contrast  Result Date: 03/04/2019 CLINICAL DATA:  Persistent left-sided weakness and sensory loss. History of lupus. Evaluate for cord demyelinating lesions. EXAM: MRI CERVICAL AND THORACIC SPINE WITH AND WITHOUT CONTRAST TECHNIQUE: Multiplanar and multiecho pulse  sequences of the cervical spine, to include the craniocervical junction and cervicothoracic junction, and thoracic spine, were obtained with and without intravenous contrast. CONTRAST:  9 mL Gadavist intravenous contrast. COMPARISON:  MRI thoracic spine dated October 26, 2017. MRI cervical spine dated March 15, 2017. FINDINGS: MRI CERVICAL SPINE FINDINGS Alignment: Straightening of the normal cervical lordosis. Vertebrae: No fracture, evidence of discitis, or bone lesion. Cord: Normal signal and morphology.  No intradural enhancement. Posterior Fossa, vertebral arteries, paraspinal tissues: Negative. Disc levels: C2-C3:  Negative. C3-C4: Negative disc. Mild bilateral uncovertebral hypertrophy. No stenosis. C4-C5:  Minimal disc bulging.  No stenosis. C5-C6: Unchanged diffuse disc bulging and mild bilateral uncovertebral hypertrophy resulting in mild-to-moderate spinal canal stenosis and mild to moderate bilateral neuroforaminal stenosis. C6-C7: Unchanged shallow left paracentral disc protrusion. No stenosis. C7-T1:  Negative. MRI THORACIC SPINE FINDINGS Alignment:  Physiologic. Vertebrae: No fracture, evidence of discitis, or bone lesion. Cord:  Normal signal and morphology.  No intradural enhancement. Paraspinal and other soft tissues: Negative. Disc levels: No significant disc bulge or herniation. No spinal canal or neuroforaminal stenosis. IMPRESSION: 1. Normal cervicothoracic spinal cord. No evidence of demyelinating disease. 2. Unchanged cervical spondylosis with mild-to-moderate stenosis at C5-C6. Electronically Signed   By: Titus Dubin M.D.   On: 03/04/2019 17:26   ASSESSMENT AND PLAN:   1. Acute left-sided paresthesias with facial droop Patient had MRI of the brain done already with no acute CVA.  MRA head done reveale LEFT P2 flow-limiting stenosis versus focal occlusion with intermediate collaterals. Symptoms said to be in similar distribution to previous stroke. Already initiated CVA/TIA  protocol.  2D echocardiogram done with normal ejection fraction of 60 to 65%.  Cavity size was normal.  Carotid Doppler ultrasound done with less than 50% stenosis bilaterally.  Vertebral arteries patent with antegrade flow. Seen by occupational therapist.  Home health with OT recommended.  Seen by speech therapist.  No needs identified.  Seen by physical therapist.  Home health with physical therapy with intermittent supervision recommended.  Patient has chronic back pains and uses rolling walker at baseline.  Continue using the same on discharge. Aspirin was discontinued since patient had symptoms while on aspirin.  Patient currently on Plavix and statins.  Lipid panel done with LDL of 91. Patient reevaluated by neurologist on call yesterday Dr. Orlena Sheldon requested for MRI of the thoracic and cervical spine with no evidence of demyelinating disease.  Patient reported having prior history of SLE diagnosed in 2014 for which she is currently on methotrexate and Plaquenil.  Neurologist from yesterday recommended considering high-dose IV Solu-Medrol to suppress presumed CNS lupus inflammatory activity if patient is not better by today.  Patient reports being allergic to all forms of steroids. I discussed case with neurologist on call today Dr. Doy Mince who evaluated patient today.  We will follow-up on her recommendations.  2. Chronic tobacco smoker abuse/dependency Nicotine patch cessation counseling ordered  3. Chronic benign essential hypertension Stable Continue home regiment  4. Acute hyperglycemia with chronic diabetes mellitus type 2 Hold metformin while in house, sliding scale insulin with Accu-Cheks per routine, continue Lantus, statin therapy.  Recent glycosylated hemoglobin level of 5.2.  DVT prophylaxis; Lovenox  Disposition; anticipate discharge home with home health with physical therapy in a.m. if no further work-up recommended by neurologist.  All the records are reviewed and case  discussed with Care Management/Social Worker. Management plans discussed with the patient, family and they are in agreement.  CODE STATUS: Full Code  TOTAL TIME TAKING CARE OF THIS PATIENT: 27 minutes.   More than 50% of the time was spent in counseling/coordination of care: YES  POSSIBLE D/C IN 1 DAY, DEPENDING ON CLINICAL CONDITION.   Ryanne Morand M.D on 03/05/2019 at 1:54 PM  Between 7am to 6pm - Pager - [phone]  After 6pm go to [email or web address] - Proofreader  [facility]  Office  [phone]  CC: Primary care physician; Pleas Koch, NP  Note: This dictation was prepared with Dragon dictation along with smaller phrase technology. Any transcriptional errors that result from this process are unintentional.

## 2019-03-05 NOTE — Care Management Note (Signed)
Case Management Note  Patient Details  Name: Lauren Winters MRN: [id number] Date of Birth: 1966/12/27  Subjective/Objective:                  Met with the patient to discuss [place] and Needs Patient lives at home with Husband and daughter Patient has a BSC and RW at home No DME needs Patient was provided the [facility] List per CMS.gove and a copy placed on the chart She has used [facility] in the past and wants to again, Notified Corene Cornea with [facility] requesting to accept the patient via secure Email Patient has transportation  Pharmacy is [facility] in [place], PCP is Dr. Carlis Abbott  Action/Plan: [facility] list provided to the patient per CMS.gov a copy placed on the chart. [facility] chosen, Notified jason with [facility]    Expected Discharge Date:  03/05/19               Expected Discharge Plan:     In-House Referral:     Discharge planning Services  CM Consult  Post Acute Care Choice:  Home Health Choice offered to:  Patient  DME Arranged:    DME Agency:     HH Arranged:  PT HH Agency:  [facility]  Status of Service:  In process, will continue to follow  If discussed at Long Length of Stay Meetings, dates discussed:    Additional Comments:  Su Hilt, RN 03/05/2019, 11:34 AM

## 2019-03-05 NOTE — Progress Notes (Signed)
Occupational Therapy Treatment Patient Details Name: GRACY EHLY MRN: [id number] DOB: 11/24/1966 Today's Date: 03/05/2019    History of present illness Pt is a 53 year old female admitted with report of L side weakness and parasthesia.  PMH includes stroke affecting R side, Lupus, RA and Sjogren's.   OT comments  Pt seen for OT treatment on this date. Upon arrival to room pt awake/alert. Pt seated upright in bed with husband at bedside. Pt agreeable to tx. Pt A&O x 4 reporting 7/10 pain in lower back. Message regarding pt pain level left for RN with unit clerk after session. Pt provided with reinforcement on prior education in LUE [facility] activities as well as educated on proper positioning of the LUE during functional activities in order to maintain safety and skin integrity of the affected side. Pt return demonstrated LUE exercises during session including sequential opposition, digit composite abduction, and digit composite adduction with palm pressed on tray table. Pt encouraged to utilize LUE during functional activities. Pt and provider discussed strategies for mgmt of LUE during ADL tasks. Pt making good progress toward goals and continues to benefit from skilled OT services to maximize return to PLOF and minimize risk of future falls, injury, caregiver burden, and readmission. Will continue to follow POC as written. Discharge recommendation remains appropriate.    Follow Up Recommendations  Home health OT;Supervision - Intermittent    Equipment Recommendations  3 in 1 bedside commode    Recommendations for Other Services      Precautions / Restrictions Precautions Precautions: Fall Restrictions Weight Bearing Restrictions: No       Mobility Bed Mobility Overal bed mobility: Modified Independent             General bed mobility comments: increased time  Transfers Overall transfer level: Needs assistance                    Balance Overall balance assessment:  Needs assistance Sitting-balance support: Single extremity supported;Feet supported Sitting balance-Leahy Scale: Good Sitting balance - Comments: Pt steady reaching within BOS while seated EOB.                                    ADL either performed or assessed with clinical judgement   ADL Overall ADL's : Needs assistance/impaired                                             Vision Baseline Vision/History: Wears glasses Wears Glasses: At all times Patient Visual Report: No change from baseline Vision Assessment?: No apparent visual deficits   Perception     Praxis      Cognition Arousal/Alertness: Awake/alert Behavior During Therapy: WFL for tasks assessed/performed Overall Cognitive Status: Within Functional Limits for tasks assessed                                          Exercises Hand Exercises Digit Composite Abduction: Left;AROM;10 reps Digit Composite Adduction: 10 reps;AROM;Left;Seated Digit Lifts: Left;10 reps;AROM;Seated Opposition: Left;Seated;10 reps;AROM Other Exercises Other Exercises: pt provided with reinforcement education in [facility] ex for LUE with visual demo, verbal instruction, and handout provided.  Other Exercises: Pt educated on importance of positioning  of LUE during functional activities (e.g. cooking) in order to maximize safety and decrease risk of injury or skin break down d/t sensation loss.    Shoulder Instructions       General Comments      Pertinent Vitals/ Pain       Pain Score: 7  Pain Location: low back Pain Descriptors / Indicators: Aching;Sore Pain Intervention(s): Limited activity within patient's tolerance;Monitored during session;Patient requesting pain meds-RN notified;Repositioned  Home Living                                          Prior Functioning/Environment              Frequency  Min 2X/week        Progress Toward Goals  OT  Goals(current goals can now be found in the care plan section)  Progress towards OT goals: Progressing toward goals  Acute Rehab OT Goals Patient Stated Goal: To return to OP rehab once she regains her strength and address back and hip pain. OT Goal Formulation: With patient Time For Goal Achievement: 03/17/19 Potential to Achieve Goals: Good  Plan Discharge plan remains appropriate;Frequency remains appropriate    Co-evaluation                 AM-PAC OT "6 Clicks" Daily Activity     Outcome Measure   Help from another person eating meals?: A Little Help from another person taking care of personal grooming?: A Little Help from another person toileting, which includes using toliet, bedpan, or urinal?: A Little Help from another person bathing (including washing, rinsing, drying)?: A Little Help from another person to put on and taking off regular upper body clothing?: A Little Help from another person to put on and taking off regular lower body clothing?: A Little 6 Click Score: 18    End of Session    OT Visit Diagnosis: Other abnormalities of gait and mobility (R26.89);Repeated falls (R29.6);Hemiplegia and hemiparesis Hemiplegia - Right/Left: Left Hemiplegia - dominant/non-dominant: Non-Dominant Hemiplegia - caused by: Unspecified   Activity Tolerance Patient tolerated treatment well   Patient Left in bed;with call bell/phone within reach;with bed alarm set;with family/visitor present   Nurse Communication Patient requests pain meds        Time: 1445-1500 OT Time Calculation (min): 15 min  Charges: OT General Charges $OT Visit: 1 Visit OT Treatments $Therapeutic Exercise: 8-22 mins  Shara Blazing, M.S., OTR/L Ascom: [phone] 03/05/19, 3:22 PM

## 2019-03-06 DIAGNOSIS — R202 Paresthesia of skin: Secondary | ICD-10-CM

## 2019-03-06 LAB — CBC
HCT: 37.6 % (ref 36.0–46.0)
Hemoglobin: 12.2 g/dL (ref 12.0–15.0)
MCH: 26.9 pg (ref 26.0–34.0)
MCHC: 32.4 g/dL (ref 30.0–36.0)
MCV: 82.8 fL (ref 80.0–100.0)
PLATELETS: 345 10*3/uL (ref 150–400)
RBC: 4.54 MIL/uL (ref 3.87–5.11)
RDW: 13 % (ref 11.5–15.5)
WBC: 8 10*3/uL (ref 4.0–10.5)
nRBC: 0 % (ref 0.0–0.2)

## 2019-03-06 LAB — BASIC METABOLIC PANEL
Anion gap: 10 (ref 5–15)
BUN: 18 mg/dL (ref 6–20)
CHLORIDE: 98 mmol/L (ref 98–111)
CO2: 29 mmol/L (ref 22–32)
CREATININE: 0.95 mg/dL (ref 0.44–1.00)
Calcium: 8.9 mg/dL (ref 8.9–10.3)
GFR calc Af Amer: >60 mL/min (ref >60)
GFR calc non Af Amer: >60 mL/min (ref >60)
Glucose, Bld: 164 mg/dL — ABNORMAL HIGH (ref 70–99)
Potassium: 3.2 mmol/L — ABNORMAL LOW (ref 3.5–5.1)
SODIUM: 137 mmol/L (ref 135–145)

## 2019-03-06 LAB — GLUCOSE, CAPILLARY
GLUCOSE-CAPILLARY: 186 mg/dL — AB (ref 70–99)
GLUCOSE-CAPILLARY: 328 mg/dL — AB (ref 70–99)

## 2019-03-06 LAB — HIV ANTIBODY (ROUTINE TESTING W REFLEX): HIV Screen 4th Generation wRfx: NONREACTIVE

## 2019-03-06 LAB — HEAVY METALS, BLOOD
Arsenic: 5 ug/L (ref 2–23)
Lead: NOT DETECTED ug/dL (ref 0–4)
Mercury: NOT DETECTED ug/L (ref 0.0–14.9)

## 2019-03-06 LAB — MISC LABCORP TEST (SEND OUT)
LabCorp test name: 164863
Labcorp test code: 164863

## 2019-03-06 LAB — MAGNESIUM: MAGNESIUM: 1.8 mg/dL (ref 1.7–2.4)

## 2019-03-06 MED ORDER — CLOPIDOGREL BISULFATE 75 MG PO TABS: 75.0000 mg | ORAL_TABLET | Freq: Every day | ORAL | 0 refills | Status: DC

## 2019-03-06 MED ORDER — INSULIN GLARGINE 100 UNIT/ML SOLOSTAR PEN: 16.0000 [IU] | PEN_INJECTOR | Freq: Every evening | SUBCUTANEOUS | 0 refills | Status: DC

## 2019-03-06 MED ORDER — GABAPENTIN 300 MG PO CAPS: 600.0000 mg | ORAL_CAPSULE | Freq: Three times a day (TID) | ORAL | 0 refills | Status: DC

## 2019-03-06 MED ORDER — POTASSIUM CHLORIDE CRYS ER 20 MEQ PO TBCR
40.0000 meq | EXTENDED_RELEASE_TABLET | Freq: Once | ORAL | Status: AC
  Administered 2019-03-06: 10:00:00 40 meq via ORAL
  Filled 2019-03-06: qty 4

## 2019-03-06 NOTE — Progress Notes (Addendum)
Subjective: Patient report some improvement in her lower extremities with increase in Gabapentin. Still feel as if left ear "is on fire".  Numbness remains in the right hand and is moving across her face.    Objective: Current vital signs: BP (!) 115/58 (BP Location: Left Arm)   Pulse (!) 59   Temp 97.8 F (36.6 C) (Oral)   Resp 18   Ht 5' 6"  (1.676 m)   Wt 99.3 kg   SpO2 96%   BMI 35.35 kg/m  Vital signs in last 24 hours: Temp:  [97.8 F (36.6 C)-98.4 F (36.9 C)] 97.8 F (36.6 C) (03/10 0435) Pulse Rate:  [59-66] 59 (03/10 0435) Resp:  [17-18] 18 (03/10 0435) BP: (112-139)/(58-64) 115/58 (03/10 0435) SpO2:  [96 %-97 %] 96 % (03/10 0435)  Intake/Output from previous day: 03/09 0701 - 03/10 0700 In: 483 [P.O.:480; I.V.:3] Out: -  Intake/Output this shift: Total I/O In: 240 [P.O.:240] Out: -  Nutritional status:  Diet Order            Diet Heart Room service appropriate? Yes; Fluid consistency: Thin  Diet effective now             Neurologic Exam: Mental Status: Alert, oriented, thought content appropriate.  Speech fluent without evidence of aphasia.  Able to follow 3 step commands without difficulty. Cranial Nerves: II: Discs flat bilaterally; Visual fields grossly normal, pupils equal, round, reactive to light and accommodation III,IV, VI: ptosis not present, extra-ocular motions intact bilaterally V,VII: smile symmetric, facial light touch sensation normal bilaterally VIII: hearing normal bilaterally IX,X: gag reflex present XI: bilateral shoulder shrug XII: midline tongue extension Motor: Able to lift all extremities against gravity.   Sensory: Reduced pinprick in the feet and left hand  Lab Results: Basic Metabolic Panel: Recent Labs  Lab 03/02/19 1404 03/04/19 0556 03/06/19 0311  NA 135 136 137  K 3.5 3.8 3.2*  CL 97* 99 98  CO2 28 30 29   GLUCOSE 230* 207* 164*  BUN 9 16 18   CREATININE 0.63 0.91 0.95  CALCIUM 9.7 9.3 8.9  MG  --  2.0 1.8     Liver Function Tests: Recent Labs  Lab 03/02/19 1404  AST 26  ALT 17  ALKPHOS 101  BILITOT 1.0  PROT 8.9*  ALBUMIN 4.4   No results for input(s): LIPASE, AMYLASE in the last 168 hours. No results for input(s): AMMONIA in the last 168 hours.  CBC: Recent Labs  Lab 03/02/19 1404 03/06/19 0311  WBC 6.5 8.0  NEUTROABS 3.7  --   HGB 13.8 12.2  HCT 42.1 37.6  MCV 81.9 82.8  PLT 365 345    Cardiac Enzymes: No results for input(s): CKTOTAL, CKMB, CKMBINDEX, TROPONINI in the last 168 hours.  Lipid Panel: Recent Labs  Lab 03/03/19 0510  CHOL 170  TRIG 223*  HDL 34*  CHOLHDL 5.0  VLDL 45*  LDLCALC 91    CBG: Recent Labs  Lab 03/02/19 1356 03/05/19 1152 03/05/19 1712 03/05/19 2101 03/06/19 0742  GLUCAP 217* 238* 162* 150* 186*    Microbiology: Results for orders placed or performed during the hospital encounter of 05/17/18  Surgical pcr screen     Status: None   Collection Time: 05/17/18  1:32 PM  Result Value Ref Range Status   MRSA, PCR NEGATIVE NEGATIVE Final   Staphylococcus aureus NEGATIVE NEGATIVE Final    Comment: (NOTE) The Xpert SA Assay (FDA approved for NASAL specimens in patients 19 years of age and older),  is one component of a comprehensive surveillance program. It is not intended to diagnose infection nor to guide or monitor treatment. Performed at [facility], [place] [address], [place], [place] [address]     Coagulation Studies: No results for input(s): LABPROT, INR in the last 72 hours.  Imaging: Mr Cervical Spine W Wo Contrast  Result Date: 03/04/2019 CLINICAL DATA:  Persistent left-sided weakness and sensory loss. History of lupus. Evaluate for cord demyelinating lesions. EXAM: MRI CERVICAL AND THORACIC SPINE WITH AND WITHOUT CONTRAST TECHNIQUE: Multiplanar and multiecho pulse sequences of the cervical spine, to include the craniocervical junction and cervicothoracic junction, and thoracic spine, were obtained with and  without intravenous contrast. CONTRAST:  9 mL Gadavist intravenous contrast. COMPARISON:  MRI thoracic spine dated October 26, 2017. MRI cervical spine dated March 15, 2017. FINDINGS: MRI CERVICAL SPINE FINDINGS Alignment: Straightening of the normal cervical lordosis. Vertebrae: No fracture, evidence of discitis, or bone lesion. Cord: Normal signal and morphology.  No intradural enhancement. Posterior Fossa, vertebral arteries, paraspinal tissues: Negative. Disc levels: C2-C3:  Negative. C3-C4: Negative disc. Mild bilateral uncovertebral hypertrophy. No stenosis. C4-C5:  Minimal disc bulging.  No stenosis. C5-C6: Unchanged diffuse disc bulging and mild bilateral uncovertebral hypertrophy resulting in mild-to-moderate spinal canal stenosis and mild to moderate bilateral neuroforaminal stenosis. C6-C7: Unchanged shallow left paracentral disc protrusion. No stenosis. C7-T1:  Negative. MRI THORACIC SPINE FINDINGS Alignment:  Physiologic. Vertebrae: No fracture, evidence of discitis, or bone lesion. Cord:  Normal signal and morphology.  No intradural enhancement. Paraspinal and other soft tissues: Negative. Disc levels: No significant disc bulge or herniation. No spinal canal or neuroforaminal stenosis. IMPRESSION: 1. Normal cervicothoracic spinal cord. No evidence of demyelinating disease. 2. Unchanged cervical spondylosis with mild-to-moderate stenosis at C5-C6. Electronically Signed   By: Titus Dubin M.D.   On: 03/04/2019 17:26   Mr Thoracic Spine W Wo Contrast  Result Date: 03/04/2019 CLINICAL DATA:  Persistent left-sided weakness and sensory loss. History of lupus. Evaluate for cord demyelinating lesions. EXAM: MRI CERVICAL AND THORACIC SPINE WITH AND WITHOUT CONTRAST TECHNIQUE: Multiplanar and multiecho pulse sequences of the cervical spine, to include the craniocervical junction and cervicothoracic junction, and thoracic spine, were obtained with and without intravenous contrast. CONTRAST:  9 mL Gadavist  intravenous contrast. COMPARISON:  MRI thoracic spine dated October 26, 2017. MRI cervical spine dated March 15, 2017. FINDINGS: MRI CERVICAL SPINE FINDINGS Alignment: Straightening of the normal cervical lordosis. Vertebrae: No fracture, evidence of discitis, or bone lesion. Cord: Normal signal and morphology.  No intradural enhancement. Posterior Fossa, vertebral arteries, paraspinal tissues: Negative. Disc levels: C2-C3:  Negative. C3-C4: Negative disc. Mild bilateral uncovertebral hypertrophy. No stenosis. C4-C5:  Minimal disc bulging.  No stenosis. C5-C6: Unchanged diffuse disc bulging and mild bilateral uncovertebral hypertrophy resulting in mild-to-moderate spinal canal stenosis and mild to moderate bilateral neuroforaminal stenosis. C6-C7: Unchanged shallow left paracentral disc protrusion. No stenosis. C7-T1:  Negative. MRI THORACIC SPINE FINDINGS Alignment:  Physiologic. Vertebrae: No fracture, evidence of discitis, or bone lesion. Cord:  Normal signal and morphology.  No intradural enhancement. Paraspinal and other soft tissues: Negative. Disc levels: No significant disc bulge or herniation. No spinal canal or neuroforaminal stenosis. IMPRESSION: 1. Normal cervicothoracic spinal cord. No evidence of demyelinating disease. 2. Unchanged cervical spondylosis with mild-to-moderate stenosis at C5-C6. Electronically Signed   By: Titus Dubin M.D.   On: 03/04/2019 17:26    Medications:  I have reviewed the patient's current medications. Prior to Admission:  Medications Prior to  Admission  Medication Sig Dispense Refill Last Dose  . amLODipine (NORVASC) 10 MG tablet Take 1 tablet once daily for blood pressure. (Patient taking differently: Take 10 mg by mouth daily. ) 90 tablet 3 03/02/2019 at 0600  . aspirin EC 81 MG tablet Take 81 mg by mouth daily.   03/01/2019 at 2100  . buPROPion (WELLBUTRIN XL) 150 MG 24 hr tablet Take 1 tablet by mouth once daily for anxiety and depression. (Patient taking  differently: Take 150 mg by mouth daily. ) 90 tablet 3 03/01/2019 at 2100  . clobetasol cream (TEMOVATE) 2.37 % Apply 1 application topically 2 (two) times daily as needed (for lupis rash).   prn at prn  . escitalopram (LEXAPRO) 20 MG tablet TAKE 1 TABLET BY MOUTH EVERY DAY 90 tablet 0 03/01/2019 at 2100  . folic acid (FOLVITE) 1 MG tablet Take 2 tablets by mouth daily. 180 tablet 1 03/01/2019 at 0600  . gabapentin (NEURONTIN) 300 MG capsule Take 600 mg by mouth 3 (three) times daily as needed (for pain).   0 03/02/2019 at 0600  . hydroxychloroquine (PLAQUENIL) 200 MG tablet Take 1 tablet (200 mg total) by mouth 2 (two) times daily. 180 tablet 0 03/01/2019 at 0600  . Insulin Glargine (LANTUS) 100 UNIT/ML Solostar Pen Inject 12 Units into the skin every evening. 15 mL 5 03/01/2019 at 2000  . metFORMIN (GLUCOPHAGE-XR) 500 MG 24 hr tablet Take 2 tablets every morning with food for diabetes. (Patient taking differently: Take 1,000 mg by mouth daily with breakfast. ) 180 tablet 3 03/02/2019 at 0600  . methotrexate (RHEUMATREX) 2.5 MG tablet Take 8 tablets (20 mg total) by mouth once a week. Caution:Chemotherapy. Protect from light. 96 tablet 0 Past Week at Unknown time  . metoprolol tartrate (LOPRESSOR) 100 MG tablet Take 1 tablet by mouth twice daily for blood pressure. (Patient taking differently: Take 100 mg by mouth 2 (two) times daily. Take 1 tablet by mouth twice daily for blood pressure.) 180 tablet 3 03/02/2019 at Unknown time  . rosuvastatin (CRESTOR) 10 MG tablet Take 1 tablet (10 mg total) by mouth daily at 6 PM. Take 1 tablet by mouth at bedtime for cholesterol. 30 tablet 2 03/01/2019 at 1800  . spironolactone (ALDACTONE) 25 MG tablet Take 25 mg by mouth daily.   03/02/2019 at 0600  . valsartan-hydrochlorothiazide (DIOVAN-HCT) 320-25 MG tablet Take 1 tablet by mouth once daily for blood pressure. (Patient taking differently: Take 1 tablet by mouth at bedtime. ) 90 tablet 3 03/01/2019 at 2200  . Vitamin D,  Ergocalciferol, (DRISDOL) 1.25 MG (50000 UT) CAPS capsule Take 1 capsule (50,000 Units total) by mouth 2 (two) times a week. 24 capsule 0 03/02/2019 at 0600  . Blood Glucose Monitoring Suppl (ONETOUCH VERIO FLEX SYSTEM) w/Device KIT Use as instructed to test blood sugar 3 times daily 1 kit 0   . cyclobenzaprine (FLEXERIL) 10 MG tablet Take 1 tablet (10 mg total) by mouth 3 (three) times daily as needed for muscle spasms. (Patient not taking: Reported on 03/02/2019) 270 tablet 0 Completed Course at Unknown time  . glucose blood test strip Use as instructed to blood sugar 3 times daily 300 each 2   . Halcinonide 0.1 % OINT Apply 1 application topically 2 (two) times daily as needed (for lupis rash).    prn at 0600  . Insulin Pen Needle (PEN NEEDLES) 31G X 6 MM MISC Use with insulin as directed. 100 each 5 Taking  . pilocarpine (SALAGEN) 5 MG  tablet Take 5 mg by mouth daily as needed (for dry mouth).    prn at prn  . tacrolimus (PROTOPIC) 0.1 % ointment Apply 1 application topically 2 (two) times daily as needed (for lupis rash).    prn at prn  . Triamcinolone Acetonide (TRIAMCINOLONE 0.1 % CREAM : EUCERIN) CREA Apply 1 application topically 2 (two) times daily as needed for rash or irritation.    prn at prn   Scheduled: . amLODipine  10 mg Oral Daily  . buPROPion  150 mg Oral Daily  . clopidogrel  75 mg Oral Daily  . enoxaparin (LOVENOX) injection  40 mg Subcutaneous Q24H  . escitalopram  20 mg Oral Daily  . folic acid  2 mg Oral Daily  . gabapentin  600 mg Oral BID  . irbesartan  300 mg Oral QHS   And  . hydrochlorothiazide  25 mg Oral QHS  . hydroxychloroquine  200 mg Oral BID  . insulin aspart  0-5 Units Subcutaneous QHS  . insulin aspart  0-9 Units Subcutaneous TID WC  . insulin glargine  16 Units Subcutaneous QPM  . methotrexate  20 mg Oral Once per day on Fri  . rosuvastatin  10 mg Oral q1800  . sodium chloride flush  3 mL Intravenous Q12H  . spironolactone  25 mg Oral Daily  . Vitamin D  (Ergocalciferol)  50,000 Units Oral Once per day on Mon Thu   Patient seen and examined.  Clinical course and management discussed.  Necessary edits performed.  I agree with the above.  Assessment and plan of care developed and discussed below.    Assessment: 53 y.o. female with pertinent hx of Lupus, Raynaud's, RA, Sjogren's disease and Spondylolisthesis of lumbar region presenting with  left face, arm, and leg numbness and tingling. 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 MRI Brain did not show an acute infarct, but she does have moderately sized white matter lesions in the left frontal, right and left parietal, and right periventricular areas.  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 MRI Cervical and Thoracic spine shows no  evidence of intrinsic spinal cord lesion to suggest demyelination CDUS is normal.  TTE is normal.  LDL is 91, TG is 223, B12 196, TSH 1.248.  Patient reported worsening of symptoms yesterday but now much improved with increase in her Gabapentin.  Recommendations: 1. B1, heavy metal screen pending 2. Change Neurontin to 66m TID. Patient to follow up with outpatient Neurology for further evaluation and management.  To continue follow up sooner than currently scheduled with the outpatient physician that manages her lupus.   This patient was staffed with Dr. LMagda Paganini RDoy Mincewho personally evaluated patient, reviewed documentation and agreed with assessment and plan of care as above.  ERufina Falco DNP, FNP-BC Board certified Nurse Practitioner Neurology Department    LOS: 2 days   03/06/2019  10:43 AM  LAlexis Goodell MD Neurology [phone] 03/06/2019  11:11 AM

## 2019-03-06 NOTE — Discharge Summary (Signed)
[facility] NAME: Lauren Winters    MR#:  [id number]  DATE OF BIRTH:  [birth year]  DATE OF ADMISSION:  03/02/2019   ADMITTING PHYSICIAN: Lauren Winters  DATE OF DISCHARGE: 03/06/2019  PRIMARY CARE PHYSICIAN: Lauren Koch, NP   ADMISSION DIAGNOSIS:  Acute ischemic stroke ([place]) [I63.9] DISCHARGE DIAGNOSIS:  Active Problems:   Paresthesias   Paresthesias/numbness  SECONDARY DIAGNOSIS:   Past Medical History:  Diagnosis Date  . Anxiety   . Atypical chest pain    a. ETT 05/2014: normal stress test without evidence of myocardial ischemia or chest pain at peak stress  . Complication of anesthesia    migraine headache  . Depression   . Diabetes mellitus without complication ([place]) metformin   dx 2015  . Dyspnea    on exertion  . Fibromyalgia   . Headache   . Heart murmur    told back in 2007, but no mention with 2016 cardiac w/u  . Hypertension   . Lupus (systemic lupus erythematosus) ([place])   . Mitral regurgitation    a. 05/2014 echo: EF >55%, nl LV/RV sys fxn, mild MR/AR, no valvular stenosis   . Obesity (BMI 30-39.9)   . Raynaud's phenomenon   . Rheumatoid arthritis ([place])   . Sjogren's disease ([place])   . Spondylolisthesis of lumbar region   . Tobacco abuse    a. ongoing; b. ongoing SOB   HOSPITAL COURSE:  Chief complaint; numbness  HISTORY OF PRESENT ILLNESS:  Lauren Winters with a known history per below, which includes history of CVA, who presented to the emergency room with complaints of left-sided face/hand/leg numbness/tingling, left facial droop, started around 4 AM.  In the emergency room work-up was unimpressive, glucose 230, patient evaluated the bedside, significant other present, patient in no apparent distress, resting comfortably in bed, patient was admitted for acute left facial droop with left-sided paresthesias involving face/hand.  Please refer to the H&P for further  details   Hospital course; 1. Acute left-sided paresthesias with facial droop Patient had MRI of the brain done already with no acute CVA.  MRA head done reveale LEFT P2 flow-limiting stenosis versus focal occlusion with intermediate collaterals. Symptoms said to be in similar distribution to previous stroke. Already initiated CVA/TIA protocol.  2D echocardiogram done with normal ejection fraction of 60 to 65%.  Cavity size was normal.  Carotid Doppler ultrasound done with less than 50% stenosis bilaterally.  Vertebral arteries patent with antegrade flow.Seen by occupational therapist.  Home health with OT recommended.  Seen by speech therapist.  No needs identified.  Seen by physical therapist.  Home health with physical therapy with intermittent supervision recommended.  Patient has chronic back pains and uses rolling walker at baseline.  Continue using the same on discharge. Aspirin was discontinued since patient had symptoms while on aspirin.  Patient currently on Plavix and statins.  Lipid panel done with LDL of 91. Patient reevaluated by neurologist recently; Dr. Orlena Sheldon requested for MRI of the thoracic and cervical spine with no evidence of demyelinating disease.  Patient reported having prior history of SLE diagnosed in 2014 for which she is currently on methotrexate and Plaquenil.  Patient reevaluated by neurologist Dr. Doy Mince.  Vitamin B 1 level and heavy metal screen bending.  Recommended changing Neurontin to 600 mg p.o. 3 times daily with outpatient follow-up with [facility] neurology which patient is already setting up.  Patient  reports being allergic to steroids/not able to place on trial of steroids.  Patient clinically and hemodynamically stable.  Being discharged home with home health with physical therapy and Occupational Therapy.  2. Chronic tobacco smoker abuse/dependency Nicotine patch cessation counseling ordered  3. Chronic benign essential hypertension Stable Continue home  regiment  4. Acute hyperglycemia with chronic diabetes mellitus type 2 Resumed home regimen.  Outpatient monitoring of blood sugars and adjustment of diabetic regimen as needed.  Recent glycosylated hemoglobin level of 5.2.  DISCHARGE CONDITIONS:  Stable CONSULTS OBTAINED:  Treatment Team:  Rogue Jury, Winters Catarina Hartshorn, Winters DRUG ALLERGIES:   Allergies  Allergen Reactions  . Pseudoephedrine Hcl Shortness Of Breath and Other (See Comments)    Room starts spinning, can't breath  . Ancef [Cefazolin] Other (See Comments)    Unknown  . Clindamycin/Lincomycin Hives  . Prednisone Other (See Comments)    Chest pain w/ cortisone shots or prednisone pills Elevated blood pressure  . Adhesive [Tape] Rash and Other (See Comments)    Sensitivity to certain band aids and adhesives - burns skin, makes skin raw  . Other Rash and Other (See Comments)    Sensitivity to certain band aids and adhesives - burns skin, makes skin raw  . Sudafed [Pseudoephedrine Hcl] Other (See Comments)    Dizziness    DISCHARGE MEDICATIONS:   Allergies as of 03/06/2019      Reactions   Pseudoephedrine Hcl Shortness Of Breath, Other (See Comments)   Room starts spinning, can't breath   Ancef [cefazolin] Other (See Comments)   Unknown   Clindamycin/lincomycin Hives   Prednisone Other (See Comments)   Chest pain w/ cortisone shots or prednisone pills Elevated blood pressure   Adhesive [tape] Rash, Other (See Comments)   Sensitivity to certain band aids and adhesives - burns skin, makes skin raw   Other Rash, Other (See Comments)   Sensitivity to certain band aids and adhesives - burns skin, makes skin raw   Sudafed [pseudoephedrine Hcl] Other (See Comments)   Dizziness       Medication List    STOP taking these medications   aspirin EC 81 MG tablet   metoprolol tartrate 100 MG tablet Commonly known as:  LOPRESSOR     TAKE these medications   amLODipine 10 MG tablet Commonly known as:   NORVASC Take 1 tablet once daily for blood pressure. What changed:    how much to take  how to take this  when to take this  additional instructions   buPROPion 150 MG 24 hr tablet Commonly known as:  WELLBUTRIN XL Take 1 tablet by mouth once daily for anxiety and depression. What changed:    how much to take  how to take this  when to take this  additional instructions   clobetasol cream 0.05 % Commonly known as:  TEMOVATE Apply 1 application topically 2 (two) times daily as needed (for lupis rash).   clopidogrel 75 MG tablet Commonly known as:  PLAVIX Take 1 tablet (75 mg total) by mouth daily. Start taking on:  March 07, 2019   cyclobenzaprine 10 MG tablet Commonly known as:  FLEXERIL Take 1 tablet (10 mg total) by mouth 3 (three) times daily as needed for muscle spasms.   escitalopram 20 MG tablet Commonly known as:  LEXAPRO TAKE 1 TABLET BY MOUTH EVERY DAY   folic acid 1 MG tablet Commonly known as:  FOLVITE Take 2 tablets by mouth daily.   gabapentin 300 MG capsule  Commonly known as:  NEURONTIN Take 2 capsules (600 mg total) by mouth 3 (three) times daily for 30 days. What changed:    when to take this  reasons to take this   glucose blood test strip Use as instructed to blood sugar 3 times daily   Halcinonide 0.1 % Oint Apply 1 application topically 2 (two) times daily as needed (for lupis rash).   hydroxychloroquine 200 MG tablet Commonly known as:  PLAQUENIL Take 1 tablet (200 mg total) by mouth 2 (two) times daily.   Insulin Glargine 100 UNIT/ML Solostar Pen Commonly known as:  LANTUS Inject 16 Units into the skin every evening. What changed:  how much to take   metFORMIN 500 MG 24 hr tablet Commonly known as:  GLUCOPHAGE-XR Take 2 tablets every morning with food for diabetes. What changed:    how much to take  how to take this  when to take this  additional instructions   methotrexate 2.5 MG tablet Commonly known as:   RHEUMATREX Take 8 tablets (20 mg total) by mouth once a week. Caution:Chemotherapy. Protect from light.   OneTouch Verio Flex System w/Device Kit Use as instructed to test blood sugar 3 times daily   Pen Needles 31G X 6 MM Misc Use with insulin as directed.   pilocarpine 5 MG tablet Commonly known as:  SALAGEN Take 5 mg by mouth daily as needed (for dry mouth).   rosuvastatin 10 MG tablet Commonly known as:  Crestor Take 1 tablet (10 mg total) by mouth daily at 6 PM. Take 1 tablet by mouth at bedtime for cholesterol.   spironolactone 25 MG tablet Commonly known as:  ALDACTONE Take 25 mg by mouth daily.   tacrolimus 0.1 % ointment Commonly known as:  PROTOPIC Apply 1 application topically 2 (two) times daily as needed (for lupis rash).   triamcinolone 0.1 % cream : eucerin Crea Apply 1 application topically 2 (two) times daily as needed for rash or irritation.   valsartan-hydrochlorothiazide 320-25 MG tablet Commonly known as:  DIOVAN-HCT Take 1 tablet by mouth once daily for blood pressure. What changed:    how much to take  how to take this  when to take this  additional instructions   Vitamin D (Ergocalciferol) 1.25 MG (50000 UT) Caps capsule Commonly known as:  DRISDOL Take 1 capsule (50,000 Units total) by mouth 2 (two) times a week.        DISCHARGE INSTRUCTIONS:   DIET:  Diabetic diet DISCHARGE CONDITION:  Stable ACTIVITY:  Activity as tolerated OXYGEN:  Home Oxygen: No.  Oxygen Delivery: room air DISCHARGE LOCATION:  home   If you experience worsening of your admission symptoms, develop shortness of breath, life threatening emergency, suicidal or homicidal thoughts you must seek medical attention immediately by calling 911 or calling your [facility] immediately  if symptoms less severe.  You Must read complete instructions/literature along with all the possible adverse reactions/side effects for all the Medicines you take and that have been prescribed  to you. Take any new Medicines after you have completely understood and accpet all the possible adverse reactions/side effects.   Please note  You were cared for by a hospitalist during your hospital stay. If you have any questions about your discharge medications or the care you received while you were in the hospital after you are discharged, you can call the unit and asked to speak with the hospitalist on call if the hospitalist that took care of you is not available.  Once you are discharged, your primary care physician will handle any further medical issues. Please note that NO REFILLS for any discharge medications will be authorized once you are discharged, as it is imperative that you return to your primary care physician (or establish a relationship with a primary care physician if you do not have one) for your aftercare needs so that they can reassess your need for medications and monitor your lab values.    On the day of Discharge:  VITAL SIGNS:  Blood pressure (!) 115/58, pulse (!) 59, temperature 97.8 F (36.6 C), temperature source Oral, resp. rate 18, height _0  (1.676 m), weight 99.3 kg, SpO2 96 %. PHYSICAL EXAMINATION:  GENERAL:  53 y.o.-year-old patient lying in the bed with no acute distress.  EYES: Pupils equal, round, reactive to light and accommodation. No scleral icterus. Extraocular muscles intact.  HEENT: Head atraumatic, normocephalic. Oropharynx and nasopharynx clear.  NECK:  Supple, no jugular venous distention. No thyroid enlargement, no tenderness.  LUNGS: Normal breath sounds bilaterally, no wheezing, rales,rhonchi or crepitation. No use of accessory muscles of respiration.  CARDIOVASCULAR: S1, S2 normal. No murmurs, rubs, or gallops.  ABDOMEN: Soft, non-tender, non-distended. Bowel sounds present. No organomegaly or mass.  EXTREMITIES: No pedal edema, cyanosis, or clubbing.  NEUROLOGIC: Cranial nerves II through XII are intact. Muscle strength 5/5 in all  extremities. Sensation intact. Gait not checked.  PSYCHIATRIC: The patient is alert and oriented x 3.  SKIN: No obvious rash, lesion, or ulcer.  DATA REVIEW:   CBC Recent Labs  Lab 03/06/19 0311  WBC 8.0  HGB 12.2  HCT 37.6  PLT 345    Chemistries  Recent Labs  Lab 03/02/19 1404  03/06/19 0311  NA 135   < > 137  K 3.5   < > 3.2*  CL 97*   < > 98  CO2 28   < > 29  GLUCOSE 230*   < > 164*  BUN 9   < > 18  CREATININE 0.63   < > 0.95  CALCIUM 9.7   < > 8.9  MG  --    < > 1.8  AST 26  --   --   ALT 17  --   --   ALKPHOS 101  --   --   BILITOT 1.0  --   --    < > = values in this interval not displayed.     Microbiology Results  Results for orders placed or performed during the hospital encounter of 05/17/18  Surgical pcr screen     Status: None   Collection Time: 05/17/18  1:32 PM  Result Value Ref Range Status   MRSA, PCR NEGATIVE NEGATIVE Final   Staphylococcus aureus NEGATIVE NEGATIVE Final    Comment: (NOTE) The Xpert SA Assay (FDA approved for NASAL specimens in patients 5 years of age and older), is one component of a comprehensive surveillance program. It is not intended to diagnose infection nor to guide or monitor treatment. Performed at [facility], [place] [address] [place], [place] [address]     RADIOLOGY:  No results found.   Management plans discussed with the patient, family and they are in agreement.  CODE STATUS: Full Code   TOTAL TIME TAKING CARE OF THIS PATIENT: 36 minutes.    Darell Saputo M.D on 03/06/2019 at 11:04 AM  Between 7am to 6pm - Pager - [phone]  After 6pm go to [email or web address] - [place]  [facility] [place] [facility]  Office  [phone]  CC: Primary care physician; Lauren Koch, NP   Note: This dictation was prepared with Dragon dictation along with smaller phrase technology. Any transcriptional errors that result from this process are unintentional.

## 2019-03-06 NOTE — Progress Notes (Signed)
Patient discharged home per MD order. Prescriptions given to patient. All discharge instructions given and all questions answered. 

## 2019-03-06 NOTE — Progress Notes (Signed)
Office Visit Note  Patient: Lauren Winters             Date of Birth: 01-04-1966           MRN: [id number]             PCP: Pleas Koch, NP Referring: Pleas Koch, NP Visit Date: 03/07/2019 Occupation: @GUAROCC @  Subjective:  Paresthesias   History of Present Illness: Lauren Winters is a 53 y.o. female with history of SLE, fibromyalgia, and Sjogren's. She reports she was discharged from the hospital yesterday for left sided paresthesia and facial droop.  She will be following up with a neurologist outpatient on 03/23/19.  She denies any signs or symptoms of a lupus flare.  She continues to have chronic sicca symptoms.  She denies any sores in mouth or nose.  She denies any recent rashes. She continues to have chronic fatigue and insomnia. She continues to have chronic lower back pain. She walks with a cane. She denies any joint swelling.    Activities of Daily Living:  Patient reports morning stiffness for 5 hour.   Patient Denies nocturnal pain.  Difficulty dressing/grooming: Reports Difficulty climbing stairs: Reports Difficulty getting out of chair: Reports Difficulty using hands for taps, buttons, cutlery, and/or writing: Reports  Review of Systems  Constitutional: Positive for fatigue.  HENT: Positive for mouth dryness. Negative for mouth sores and nose dryness.   Eyes: Positive for dryness. Negative for pain and visual disturbance.  Respiratory: Negative for cough, hemoptysis, shortness of breath and difficulty breathing.   Cardiovascular: Negative for chest pain, palpitations, hypertension and swelling in legs/feet.  Gastrointestinal: Negative for blood in stool, constipation and diarrhea.  Endocrine: Negative for increased urination.  Genitourinary: Negative for painful urination.  Musculoskeletal: Positive for gait problem and morning stiffness. Negative for arthralgias, joint pain, joint swelling, myalgias, muscle weakness, muscle tenderness and  myalgias.  Skin: Negative for color change, pallor, rash, hair loss, nodules/bumps, skin tightness, ulcers and sensitivity to sunlight.  Allergic/Immunologic: Negative for susceptible to infections.  Neurological: Negative for dizziness, numbness, headaches and weakness.  Hematological: Negative for bruising/bleeding tendency and swollen glands.  Psychiatric/Behavioral: Positive for sleep disturbance. Negative for depressed mood. The patient is not nervous/anxious.     PMFS History:  Patient Active Problem List   Diagnosis Date Noted  . Paresthesias/numbness 03/04/2019  . Paresthesias 03/02/2019  . CVA (cerebral vascular accident) ([place]) 08/19/2018  . S/P lumbar spinal fusion 05/26/2018  . High risk medication use 02/03/2018  . Wound infection after surgery 01/11/2018  . S/P lumbar laminectomy 12/09/2017  . Paresthesia 05/11/2017  . HNP (herniated nucleus pulposus), lumbar 03/29/2017  . Chronic right shoulder pain 02/18/2017  . Primary osteoarthritis of both hands 12/22/2016  . Primary osteoarthritis of both knees 12/22/2016  . Spondylosis of lumbar region without myelopathy or radiculopathy 12/22/2016  . Fibromyalgia 12/22/2016  . Preventative health care 04/19/2016  . Chronic back pain 12/17/2015  . Hyperlipidemia 08/29/2015  . Other organ or system involvement in systemic lupus erythematosus ([place]) 08/04/2015  . Rheumatoid factor positive 08/04/2015  . Sjogren's syndrome ([place]) 08/04/2015  . Mitral regurgitation   . Tobacco abuse   . Atypical chest pain   . Obesity (BMI 30-39.9)   . Anxiety   . Hypertension   . Diabetes mellitus without complication ([place])   . Clinical depression 05/23/2014    Past Medical History:  Diagnosis Date  . Anxiety   . Atypical chest pain    a.  ETT 05/2014: normal stress test without evidence of myocardial ischemia or chest pain at peak stress  . Complication of anesthesia    migraine headache  . Depression   . Diabetes mellitus without  complication ([place]) metformin   dx 2015  . Dyspnea    on exertion  . Fibromyalgia   . Headache   . Heart murmur    told back in 2007, but no mention with 2016 cardiac w/u  . Hypertension   . Lupus (systemic lupus erythematosus) ([place])   . Mitral regurgitation    a. 05/2014 echo: EF >55%, nl LV/RV sys fxn, mild MR/AR, no valvular stenosis   . Obesity (BMI 30-39.9)   . Raynaud's phenomenon   . Rheumatoid arthritis ([place])   . Sjogren's disease ([place])   . Spondylolisthesis of lumbar region   . Tobacco abuse    a. ongoing; b. ongoing SOB    Family History  Problem Relation Age of Onset  . Hypertension Mother   . Valvular heart disease Mother   . CAD Father 55       CABG x 4  . Liver cancer Father 59       passed  . CAD Paternal Aunt        CABG  . Breast cancer Paternal Aunt   . CAD Paternal Aunt        CABG  . Breast cancer Paternal Aunt   . CAD Paternal Aunt        CABG  . Breast cancer Paternal Aunt   . CAD Paternal Aunt        CABG  . Breast cancer Paternal Aunt   . CAD Paternal Aunt        CABG  . Breast cancer Paternal Aunt   . CAD Paternal Uncle        MI s/p CABG  . Stroke Maternal Grandmother   . Stroke Maternal Grandfather   . CAD Maternal Grandfather    Past Surgical History:  Procedure Laterality Date  . ABDOMINAL HYSTERECTOMY    . BACK SURGERY    . CESAREAN SECTION     x3  . LUMBAR LAMINECTOMY    . LUMBAR LAMINECTOMY/DECOMPRESSION MICRODISCECTOMY N/A 03/29/2017   Procedure: Re operative Right Lumbar Three-Four Laminectomy and discectomy with Left Lumbar Three-Four, Bilateral Lumbar Four-Five Laminectomy for decompression;  Surgeon: Kevan Ny Ditty, MD;  Location: [facility];  Service: Neurosurgery;  Laterality: N/A;  . SPINAL CORD STIMULATOR INSERTION  12/09/2017   Procedure: LUMBAR SPINAL CORD STIMULATOR IMPLANT;  Surgeon: Eustace Moore, MD;  Location: [facility];  Service: Neurosurgery;;  . SPINAL CORD STIMULATOR REMOVAL N/A 01/11/2018   Procedure: LUMBAR  SPINAL CORD STIMULATOR REMOVAL;  Surgeon: Eustace Moore, MD;  Location: [facility];  Service: Neurosurgery;  Laterality: N/A;  . TUBAL LIGATION     Social History   Social History Narrative   Married.   3 children.   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    Immunization History  Administered Date(s) Administered  . Influenza,inj,Quad PF,6+ Mos 10/19/2016, 10/21/2017, 01/31/2019  . Pneumococcal Polysaccharide-23 10/27/2014  . Td 04/19/2016  . Zoster 10/27/2014     Objective: Vital Signs: BP 128/67 (BP Location: Left Arm, Patient Position: Sitting, Cuff Size: Small)   Pulse 84   Ht 5\' 6"  (1.676 m)   Wt 214 lb (97.1 kg)   BMI 34.54 kg/m    Physical Exam Vitals signs and nursing note reviewed.  Constitutional:  Appearance: She is well-developed.  HENT:     Head: Normocephalic and atraumatic.  Eyes:     Conjunctiva/sclera: Conjunctivae normal.  Neck:     Musculoskeletal: Normal range of motion.  Cardiovascular:     Rate and Rhythm: Normal rate and regular rhythm.     Heart sounds: Normal heart sounds.  Pulmonary:     Effort: Pulmonary effort is normal.     Breath sounds: Normal breath sounds.  Abdominal:     General: Bowel sounds are normal.     Palpations: Abdomen is soft.  Lymphadenopathy:     Cervical: No cervical adenopathy.  Skin:    General: Skin is warm and dry.     Capillary Refill: Capillary refill takes less than 2 seconds.  Neurological:     Mental Status: She is alert and oriented to person, place, and time.  Psychiatric:        Behavior: Behavior normal.      Musculoskeletal Exam: Generalized hyperalgesia and positive tender points.  C-spine thoracic and lumbar spine discomfort full range of motion.  Shoulder joints elbow joints wrist joint MCPs PIPs with good range of motion with no synovitis.  Hip joints knee joints ankles MTPs PIPs been good range of motion with no synovitis.  CDAI Exam: CDAI Score: Not  documented Patient Global Assessment: Not documented; Provider Global Assessment: Not documented Swollen: Not documented; Tender: Not documented Joint Exam   Not documented   There is currently no information documented on the homunculus. Go to the Rheumatology activity and complete the homunculus joint exam.  Investigation: No additional findings.  Imaging: Dg Chest 2 View  Result Date: 03/02/2019 CLINICAL DATA:  Stroke. Some chest pain. Ex-smoker. EXAM: CHEST - 2 VIEW COMPARISON:  01/11/2018. FINDINGS: Normal sized heart. Clear lungs with normal vascularity. Minimal peribronchial thickening. Minimal thoracic spine degenerative changes. IMPRESSION: Stable minimal bronchitic changes.  No acute abnormality. Electronically Signed   By: Claudie Revering M.D.   On: 03/02/2019 19:09   Ct Head Wo Contrast  Result Date: 03/02/2019 CLINICAL DATA:  Left-sided tingling and left facial droop. EXAM: CT HEAD WITHOUT CONTRAST TECHNIQUE: Contiguous axial images were obtained from the base of the skull through the vertex without intravenous contrast. COMPARISON:  Head CT 08/19/2018 FINDINGS: Brain: There is no mass, hemorrhage or extra-axial collection. The size and configuration of the ventricles and extra-axial CSF spaces are normal. There is hypoattenuation of the white matter, most commonly indicating chronic small vessel disease. Vascular: No abnormal hyperdensity of the major intracranial arteries or dural venous sinuses. No intracranial atherosclerosis. Skull: The visualized skull base, calvarium and extracranial soft tissues are normal. Sinuses/Orbits: No fluid levels or advanced mucosal thickening of the visualized paranasal sinuses. No mastoid or middle ear effusion. The orbits are normal. IMPRESSION: 1. No acute intracranial abnormality. 2. Findings of chronic small vessel disease. Electronically Signed   By: Ulyses Jarred M.D.   On: 03/02/2019 14:47   Mr Brain Wo Contrast  Result Date:  03/02/2019 CLINICAL DATA:  LEFT sided weakness beginning at 4 a.m. History of diabetes, hypertension, lupus. EXAM: MRI HEAD WITHOUT CONTRAST MRA HEAD WITHOUT CONTRAST TECHNIQUE: Multiplanar, multiecho pulse sequences of the brain and surrounding structures were obtained without intravenous contrast. Angiographic images of the head were obtained using MRA technique without contrast. COMPARISON:  None. FINDINGS: MRI HEAD FINDINGS INTRACRANIAL CONTENTS: No reduced diffusion to suggest acute ischemia or hyperacute demyelination. No susceptibility artifact to suggest hemorrhage. No parenchymal brain volume loss for age. No hydrocephalus. Scattered  subcentimeter supratentorial white matter FLAIR T2 hyperintensities, greater than expected for age, a few of which are along the periventricular margin; however no T1 hypointensity associated with black holes of demyelination. No suspicious parenchymal signal, masses, mass effect. No abnormal extra-axial fluid collections. No extra-axial masses. VASCULAR: Normal major intracranial vascular flow voids present at skull base. SKULL AND UPPER CERVICAL SPINE: No abnormal sellar expansion. No suspicious calvarial bone marrow signal. Craniocervical junction maintained. SINUSES/ORBITS: The mastoid air-cells and included paranasal sinuses are well-aerated.The included ocular globes and orbital contents are non-suspicious. OTHER: None. MRA HEAD FINDINGS ANTERIOR CIRCULATION: Normal flow related enhancement of the included cervical, petrous, cavernous and supraclinoid internal carotid arteries. Patent anterior communicating artery. Patent anterior and middle cerebral arteries. No large vessel occlusion, flow limiting stenosis, or aneurysm. POSTERIOR CIRCULATION: LEFT vertebral artery is dominant, RIGHT vertebral artery terminates in the posterior inferior cerebellar artery. Vertebrobasilar arteries are patent, with normal flow related enhancement of the main branch vessels. Flow-limiting  stenosis versus focally occluded LEFT P2 segment with thready reconstitution/collateralization. Bilateral posterior communicating arteries present. No large vessel occlusion, or aneurysm. ANATOMIC VARIANTS: None. Source data and MIP images were reviewed. IMPRESSION: MRI HEAD: 1. No acute intracranial process. 2. Mild white matter changes most commonly seen with chronic small vessel ischemic disease. MRA HEAD: 1. LEFT P2 flow-limiting stenosis versus focal occlusion with intermediate collaterals. Electronically Signed   By: Elon Alas M.D.   On: 03/02/2019 23:16   Mr Cervical Spine W Wo Contrast  Result Date: 03/04/2019 CLINICAL DATA:  Persistent left-sided weakness and sensory loss. History of lupus. Evaluate for cord demyelinating lesions. EXAM: MRI CERVICAL AND THORACIC SPINE WITH AND WITHOUT CONTRAST TECHNIQUE: Multiplanar and multiecho pulse sequences of the cervical spine, to include the craniocervical junction and cervicothoracic junction, and thoracic spine, were obtained with and without intravenous contrast. CONTRAST:  9 mL Gadavist intravenous contrast. COMPARISON:  MRI thoracic spine dated October 26, 2017. MRI cervical spine dated March 15, 2017. FINDINGS: MRI CERVICAL SPINE FINDINGS Alignment: Straightening of the normal cervical lordosis. Vertebrae: No fracture, evidence of discitis, or bone lesion. Cord: Normal signal and morphology.  No intradural enhancement. Posterior Fossa, vertebral arteries, paraspinal tissues: Negative. Disc levels: C2-C3:  Negative. C3-C4: Negative disc. Mild bilateral uncovertebral hypertrophy. No stenosis. C4-C5:  Minimal disc bulging.  No stenosis. C5-C6: Unchanged diffuse disc bulging and mild bilateral uncovertebral hypertrophy resulting in mild-to-moderate spinal canal stenosis and mild to moderate bilateral neuroforaminal stenosis. C6-C7: Unchanged shallow left paracentral disc protrusion. No stenosis. C7-T1:  Negative. MRI THORACIC SPINE FINDINGS Alignment:   Physiologic. Vertebrae: No fracture, evidence of discitis, or bone lesion. Cord:  Normal signal and morphology.  No intradural enhancement. Paraspinal and other soft tissues: Negative. Disc levels: No significant disc bulge or herniation. No spinal canal or neuroforaminal stenosis. IMPRESSION: 1. Normal cervicothoracic spinal cord. No evidence of demyelinating disease. 2. Unchanged cervical spondylosis with mild-to-moderate stenosis at C5-C6. Electronically Signed   By: Titus Dubin M.D.   On: 03/04/2019 17:26   Mr Thoracic Spine W Wo Contrast  Result Date: 03/04/2019 CLINICAL DATA:  Persistent left-sided weakness and sensory loss. History of lupus. Evaluate for cord demyelinating lesions. EXAM: MRI CERVICAL AND THORACIC SPINE WITH AND WITHOUT CONTRAST TECHNIQUE: Multiplanar and multiecho pulse sequences of the cervical spine, to include the craniocervical junction and cervicothoracic junction, and thoracic spine, were obtained with and without intravenous contrast. CONTRAST:  9 mL Gadavist intravenous contrast. COMPARISON:  MRI thoracic spine dated October 26, 2017. MRI cervical spine dated March 15, 2017. FINDINGS: MRI CERVICAL SPINE FINDINGS Alignment: Straightening of the normal cervical lordosis. Vertebrae: No fracture, evidence of discitis, or bone lesion. Cord: Normal signal and morphology.  No intradural enhancement. Posterior Fossa, vertebral arteries, paraspinal tissues: Negative. Disc levels: C2-C3:  Negative. C3-C4: Negative disc. Mild bilateral uncovertebral hypertrophy. No stenosis. C4-C5:  Minimal disc bulging.  No stenosis. C5-C6: Unchanged diffuse disc bulging and mild bilateral uncovertebral hypertrophy resulting in mild-to-moderate spinal canal stenosis and mild to moderate bilateral neuroforaminal stenosis. C6-C7: Unchanged shallow left paracentral disc protrusion. No stenosis. C7-T1:  Negative. MRI THORACIC SPINE FINDINGS Alignment:  Physiologic. Vertebrae: No fracture, evidence of  discitis, or bone lesion. Cord:  Normal signal and morphology.  No intradural enhancement. Paraspinal and other soft tissues: Negative. Disc levels: No significant disc bulge or herniation. No spinal canal or neuroforaminal stenosis. IMPRESSION: 1. Normal cervicothoracic spinal cord. No evidence of demyelinating disease. 2. Unchanged cervical spondylosis with mild-to-moderate stenosis at C5-C6. Electronically Signed   By: Titus Dubin M.D.   On: 03/04/2019 17:26   US Carotid Bilateral (at [facility] Only)  Result Date: 03/03/2019 CLINICAL DATA:  CVA. EXAM: BILATERAL CAROTID DUPLEX ULTRASOUND TECHNIQUE: Pearline Cables scale imaging, color Doppler and duplex ultrasound were performed of bilateral carotid and vertebral arteries in the neck. COMPARISON:  08/19/2018 FINDINGS: Criteria: Quantification of carotid stenosis is based on velocity parameters that correlate the residual internal carotid diameter with NASCET-based stenosis levels, using the diameter of the distal internal carotid lumen as the denominator for stenosis measurement. The following velocity measurements were obtained: RIGHT ICA: 105 cm/sec CCA: 84 cm/sec SYSTOLIC ICA/CCA RATIO:  1.2 ECA: 112 cm/sec LEFT ICA: 119 cm/sec CCA: 628 cm/sec SYSTOLIC ICA/CCA RATIO:  1.1 ECA: 98 cm/sec RIGHT CAROTID ARTERY: Echogenic plaque at the right carotid bulb without significant stenosis. External carotid artery is patent with normal waveform. Normal waveforms and velocities in the internal carotid artery. RIGHT VERTEBRAL ARTERY: Antegrade flow and normal waveform in the right vertebral artery. LEFT CAROTID ARTERY: Minimal echogenic plaque at the left carotid bulb without significant stenosis. External carotid artery is patent with normal waveform. Minimal plaque in the proximal internal carotid artery. Normal waveforms and velocities in the internal carotid artery. LEFT VERTEBRAL ARTERY: Antegrade flow and normal waveform in the left vertebral artery. IMPRESSION: Mild  atherosclerotic disease in the carotid arteries, largest focus is at the right carotid bulb. Estimated degree of stenosis in the internal carotid arteries is less than 50% bilaterally. Patent vertebral arteries with antegrade flow. Electronically Signed   By: Markus Daft M.D.   On: 03/03/2019 09:40   Mr Jodene Nam Head/brain ZM Cm  Result Date: 03/02/2019 CLINICAL DATA:  LEFT sided weakness beginning at 4 a.m. History of diabetes, hypertension, lupus. EXAM: MRI HEAD WITHOUT CONTRAST MRA HEAD WITHOUT CONTRAST TECHNIQUE: Multiplanar, multiecho pulse sequences of the brain and surrounding structures were obtained without intravenous contrast. Angiographic images of the head were obtained using MRA technique without contrast. COMPARISON:  None. FINDINGS: MRI HEAD FINDINGS INTRACRANIAL CONTENTS: No reduced diffusion to suggest acute ischemia or hyperacute demyelination. No susceptibility artifact to suggest hemorrhage. No parenchymal brain volume loss for age. No hydrocephalus. Scattered subcentimeter supratentorial white matter FLAIR T2 hyperintensities, greater than expected for age, a few of which are along the periventricular margin; however no T1 hypointensity associated with black holes of demyelination. No suspicious parenchymal signal, masses, mass effect. No abnormal extra-axial fluid collections. No extra-axial masses. VASCULAR: Normal major intracranial vascular flow voids present at skull base. SKULL AND UPPER  CERVICAL SPINE: No abnormal sellar expansion. No suspicious calvarial bone marrow signal. Craniocervical junction maintained. SINUSES/ORBITS: The mastoid air-cells and included paranasal sinuses are well-aerated.The included ocular globes and orbital contents are non-suspicious. OTHER: None. MRA HEAD FINDINGS ANTERIOR CIRCULATION: Normal flow related enhancement of the included cervical, petrous, cavernous and supraclinoid internal carotid arteries. Patent anterior communicating artery. Patent anterior and  middle cerebral arteries. No large vessel occlusion, flow limiting stenosis, or aneurysm. POSTERIOR CIRCULATION: LEFT vertebral artery is dominant, RIGHT vertebral artery terminates in the posterior inferior cerebellar artery. Vertebrobasilar arteries are patent, with normal flow related enhancement of the main branch vessels. Flow-limiting stenosis versus focally occluded LEFT P2 segment with thready reconstitution/collateralization. Bilateral posterior communicating arteries present. No large vessel occlusion, or aneurysm. ANATOMIC VARIANTS: None. Source data and MIP images were reviewed. IMPRESSION: MRI HEAD: 1. No acute intracranial process. 2. Mild white matter changes most commonly seen with chronic small vessel ischemic disease. MRA HEAD: 1. LEFT P2 flow-limiting stenosis versus focal occlusion with intermediate collaterals. Electronically Signed   By: Elon Alas M.D.   On: 03/02/2019 23:16    Recent Labs: Lab Results  Component Value Date   WBC 8.0 03/06/2019   HGB 12.2 03/06/2019   PLT 345 03/06/2019   NA 137 03/06/2019   K 3.2 (L) 03/06/2019   CL 98 03/06/2019   CO2 29 03/06/2019   GLUCOSE 164 (H) 03/06/2019   BUN 18 03/06/2019   CREATININE 0.95 03/06/2019   BILITOT 1.0 03/02/2019   ALKPHOS 101 03/02/2019   AST 26 03/02/2019   ALT 17 03/02/2019   PROT 8.9 (H) 03/02/2019   ALBUMIN 4.4 03/02/2019   CALCIUM 8.9 03/06/2019   GFRAA >60 03/06/2019    Speciality Comments: PLQ Eye Exam: 01/16/2019 WNL @ [facility]  Procedures:  No procedures performed Allergies: Pseudoephedrine hcl; Ancef [cefazolin]; Clindamycin/lincomycin; Prednisone; Adhesive [tape]; Other; and Sudafed [pseudoephedrine hcl]   Assessment / Plan:     Visit Diagnoses: Other organ or system involvement in systemic lupus erythematosus (HCC) - +La, oral ulcers, malar rash, fatigue, photosensitivity, Raynaud's, Subcutaneous lupus-bx+, hair loss, +RF, sicca symptoms.  Patient's lupus has been under very well  control.  Her most recent labs did not show active disease.  Paresthesias-patient complains of new onset paresthesias affecting left side of her body.  She also has paresthesias in bilateral feet.  She was evaluated in the hospital by neurologist.  On review of the MRI of her C-spine she had some ischemic changes.  She also had carotid scan with 50% narrowing.  She has appointment coming up with the neurologist.   High risk medication use - MTX 8 tabs po once weekly, folic acid 2 mg po daily, PLQ 200 mg 1 tablet BID.  Her labs have been stable.  Recurrent oral ulcers-patient had no recent oral ulcers.  Sjogren's syndrome with other organ involvement ([place]) - RF+: She takes Pilocarpine 5 mg PRN which helps with sicca symptoms.    Raynaud's syndrome without gangrene-no recent activity.  Primary osteoarthritis of both hands-she had mild osteoarthritic changes which is not causing much discomfort.  Primary osteoarthritis of both knees-she has chronic pain in her knee joints.  Fibromyalgia-she has generalized pain positive tender points and hyperalgesia due to fibromyalgia.  S/P lumbar laminectomy-she continues to have severe lower back pain.  Medical problems are listed as follows:  Diabetes mellitus without complication ([place])  Essential hypertension  Mitral valve insufficiency, unspecified etiology  Tobacco abuse  History of chronic kidney disease - She is  followed by Dr. Smith Mince.   Vitamin D deficiency   Orders: No orders of the defined types were placed in this encounter.  No orders of the defined types were placed in this encounter.     Follow-Up Instructions: Return for Systemic lupus erythematosus, Fibromyalgia, Sjogren's syndrome.   Bo Merino, MD  Note - This record has been created using Editor, commissioning.  Chart creation errors have been sought, but may not always  have been located. Such creation errors do not reflect on  the standard of medical care.

## 2019-03-07 ENCOUNTER — Ambulatory Visit: Payer: BLUE CROSS/BLUE SHIELD | Admitting: Rheumatology

## 2019-03-07 ENCOUNTER — Encounter: Payer: Self-pay | Admitting: Rheumatology

## 2019-03-07 ENCOUNTER — Other Ambulatory Visit: Payer: Self-pay

## 2019-03-07 ENCOUNTER — Telehealth: Payer: Self-pay

## 2019-03-07 VITALS — BP 128/67 | HR 84 | Ht 66.0 in | Wt 214.0 lb

## 2019-03-07 DIAGNOSIS — M3219 Other organ or system involvement in systemic lupus erythematosus: Secondary | ICD-10-CM | POA: Diagnosis not present

## 2019-03-07 DIAGNOSIS — Z720 Tobacco use: Secondary | ICD-10-CM

## 2019-03-07 DIAGNOSIS — R202 Paresthesia of skin: Secondary | ICD-10-CM

## 2019-03-07 DIAGNOSIS — M19041 Primary osteoarthritis, right hand: Secondary | ICD-10-CM

## 2019-03-07 DIAGNOSIS — Z9889 Other specified postprocedural states: Secondary | ICD-10-CM

## 2019-03-07 DIAGNOSIS — M3509 Sicca syndrome with other organ involvement: Secondary | ICD-10-CM | POA: Diagnosis not present

## 2019-03-07 DIAGNOSIS — K1379 Other lesions of oral mucosa: Secondary | ICD-10-CM | POA: Diagnosis not present

## 2019-03-07 DIAGNOSIS — I10 Essential (primary) hypertension: Secondary | ICD-10-CM

## 2019-03-07 DIAGNOSIS — I7300 Raynaud's syndrome without gangrene: Secondary | ICD-10-CM

## 2019-03-07 DIAGNOSIS — Z87448 Personal history of other diseases of urinary system: Secondary | ICD-10-CM

## 2019-03-07 DIAGNOSIS — M19042 Primary osteoarthritis, left hand: Secondary | ICD-10-CM

## 2019-03-07 DIAGNOSIS — Z79899 Other long term (current) drug therapy: Secondary | ICD-10-CM | POA: Diagnosis not present

## 2019-03-07 DIAGNOSIS — M797 Fibromyalgia: Secondary | ICD-10-CM

## 2019-03-07 DIAGNOSIS — E119 Type 2 diabetes mellitus without complications: Secondary | ICD-10-CM

## 2019-03-07 DIAGNOSIS — I340 Nonrheumatic mitral (valve) insufficiency: Secondary | ICD-10-CM

## 2019-03-07 DIAGNOSIS — E559 Vitamin D deficiency, unspecified: Secondary | ICD-10-CM

## 2019-03-07 DIAGNOSIS — M170 Bilateral primary osteoarthritis of knee: Secondary | ICD-10-CM

## 2019-03-07 LAB — VITAMIN B1: Vitamin B1 (Thiamine): 89.8 nmol/L (ref 66.5–200.0)

## 2019-03-07 NOTE — Telephone Encounter (Signed)
Just an FYI,  Patient not eligible for TCM call due to insurance. Patient does have hospital follow up appointment on 03/12/2019.Thank you.

## 2019-03-07 NOTE — Telephone Encounter (Signed)
Noted  

## 2019-03-11 NOTE — Telephone Encounter (Signed)
Lauren Winters, we will be canceling her appointment for 03/12/19

## 2019-03-12 ENCOUNTER — Inpatient Hospital Stay: Payer: Self-pay | Admitting: Primary Care

## 2019-03-12 NOTE — Telephone Encounter (Signed)
Canceled.

## 2019-06-03 ENCOUNTER — Other Ambulatory Visit: Payer: Self-pay | Admitting: Primary Care

## 2019-06-03 DIAGNOSIS — F419 Anxiety disorder, unspecified: Secondary | ICD-10-CM

## 2019-06-22 NOTE — Progress Notes (Deleted)
Office Visit Note  Patient: Lauren Winters             Date of Birth: 1966-12-17           MRN: [id number]             PCP: Pleas Koch, NP Referring: Pleas Koch, NP Visit Date: 07/04/2019 Occupation: @GUAROCC @  Subjective:  No chief complaint on file.   Methotrexate 2.5 mg 8 tablets every 7 days, folic acid 1 mg 2 tablets daily, and Plaquenil 200 mg 1 tablet twice daily.  Last Plaquenil eye exam normal on 01/16/2019.  Most recent CBC/BMP within normal limits except for low potassium and elevated glucose on 03/06/2019.  Most recent CMP showed normal liver enzymes on 03/02/2019.  Due for CBC/CMP today and will monitor every 3 months.  Standing orders placed.  She received the flu vaccine in February and previously Pneumovax 23 and Zostavax.  Recommend Prevnar 13 and Shingrix vaccines as indicated for immunosuppressant therapy.  History of Present Illness: Lauren Winters is a 52 y.o. female ***   Activities of Daily Living:  Patient reports morning stiffness for *** {minute/hour:19697}.   Patient {ACTIONS;DENIES/REPORTS:21021675::"Denies"} nocturnal pain.  Difficulty dressing/grooming: {ACTIONS;DENIES/REPORTS:21021675::"Denies"} Difficulty climbing stairs: {ACTIONS;DENIES/REPORTS:21021675::"Denies"} Difficulty getting out of chair: {ACTIONS;DENIES/REPORTS:21021675::"Denies"} Difficulty using hands for taps, buttons, cutlery, and/or writing: {ACTIONS;DENIES/REPORTS:21021675::"Denies"}  No Rheumatology ROS completed.   PMFS History:  Patient Active Problem List   Diagnosis Date Noted  . Paresthesias/numbness 03/04/2019  . Paresthesias 03/02/2019  . CVA (cerebral vascular accident) ([place]) 08/19/2018  . S/P lumbar spinal fusion 05/26/2018  . High risk medication use 02/03/2018  . Wound infection after surgery 01/11/2018  . S/P lumbar laminectomy 12/09/2017  . Paresthesia 05/11/2017  . HNP (herniated nucleus pulposus), lumbar 03/29/2017  . Chronic right shoulder pain  02/18/2017  . Primary osteoarthritis of both hands 12/22/2016  . Primary osteoarthritis of both knees 12/22/2016  . Spondylosis of lumbar region without myelopathy or radiculopathy 12/22/2016  . Fibromyalgia 12/22/2016  . Preventative health care 04/19/2016  . Chronic back pain 12/17/2015  . Hyperlipidemia 08/29/2015  . Other organ or system involvement in systemic lupus erythematosus ([place]) 08/04/2015  . Rheumatoid factor positive 08/04/2015  . Sjogren's syndrome ([place]) 08/04/2015  . Mitral regurgitation   . Tobacco abuse   . Atypical chest pain   . Obesity (BMI 30-39.9)   . Anxiety   . Hypertension   . Diabetes mellitus without complication ([place])   . Clinical depression 05/23/2014    Past Medical History:  Diagnosis Date  . Anxiety   . Atypical chest pain    a. ETT 05/2014: normal stress test without evidence of myocardial ischemia or chest pain at peak stress  . Complication of anesthesia    migraine headache  . Depression   . Diabetes mellitus without complication ([place]) metformin   dx 2015  . Dyspnea    on exertion  . Fibromyalgia   . Headache   . Heart murmur    told back in 2007, but no mention with 2016 cardiac w/u  . Hypertension   . Lupus (systemic lupus erythematosus) ([place])   . Mitral regurgitation    a. 05/2014 echo: EF >55%, nl LV/RV sys fxn, mild MR/AR, no valvular stenosis   . Obesity (BMI 30-39.9)   . Raynaud's phenomenon   . Rheumatoid arthritis ([place])   . Sjogren's disease ([place])   . Spondylolisthesis of lumbar region   . Tobacco abuse    a. ongoing; b. ongoing  SOB    Family History  Problem Relation Age of Onset  . Hypertension Mother   . Valvular heart disease Mother   . CAD Father 43       CABG x 4  . Liver cancer Father 40       passed  . CAD Paternal Aunt        CABG  . Breast cancer Paternal Aunt   . CAD Paternal Aunt        CABG  . Breast cancer Paternal Aunt   . CAD Paternal Aunt        CABG  . Breast cancer Paternal Aunt   . CAD  Paternal Aunt        CABG  . Breast cancer Paternal Aunt   . CAD Paternal Aunt        CABG  . Breast cancer Paternal Aunt   . CAD Paternal Uncle        MI s/p CABG  . Stroke Maternal Grandmother   . Stroke Maternal Grandfather   . CAD Maternal Grandfather    Past Surgical History:  Procedure Laterality Date  . ABDOMINAL HYSTERECTOMY    . BACK SURGERY    . CESAREAN SECTION     x3  . LUMBAR LAMINECTOMY    . LUMBAR LAMINECTOMY/DECOMPRESSION MICRODISCECTOMY N/A 03/29/2017   Procedure: Re operative Right Lumbar Three-Four Laminectomy and discectomy with Left Lumbar Three-Four, Bilateral Lumbar Four-Five Laminectomy for decompression;  Surgeon: Kevan Ny Ditty, MD;  Location: [place];  Service: Neurosurgery;  Laterality: N/A;  . SPINAL CORD STIMULATOR INSERTION  12/09/2017   Procedure: LUMBAR SPINAL CORD STIMULATOR IMPLANT;  Surgeon: Eustace Moore, MD;  Location: [place];  Service: Neurosurgery;;  . SPINAL CORD STIMULATOR REMOVAL N/A 01/11/2018   Procedure: LUMBAR SPINAL CORD STIMULATOR REMOVAL;  Surgeon: Eustace Moore, MD;  Location: [place];  Service: Neurosurgery;  Laterality: N/A;  . TUBAL LIGATION     Social History   Social History Narrative   Married.   3 children.   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    Immunization History  Administered Date(s) Administered  . Influenza,inj,Quad PF,6+ Mos 10/19/2016, 10/21/2017, 01/31/2019  . Pneumococcal Polysaccharide-23 10/27/2014  . Td 04/19/2016  . Zoster 10/27/2014     Objective: Vital Signs: There were no vitals taken for this visit.   Physical Exam   Musculoskeletal Exam: ***  CDAI Exam: CDAI Score: - Patient Global: -; Provider Global: - Swollen: -; Tender: - Joint Exam   No joint exam has been documented for this visit   There is currently no information documented on the homunculus. Go to the Rheumatology activity and complete the homunculus joint exam.  Investigation: No  additional findings.  Imaging: No results found.  Recent Labs: Lab Results  Component Value Date   WBC 8.0 03/06/2019   HGB 12.2 03/06/2019   PLT 345 03/06/2019   NA 137 03/06/2019   K 3.2 (L) 03/06/2019   CL 98 03/06/2019   CO2 29 03/06/2019   GLUCOSE 164 (H) 03/06/2019   BUN 18 03/06/2019   CREATININE 0.95 03/06/2019   BILITOT 1.0 03/02/2019   ALKPHOS 101 03/02/2019   AST 26 03/02/2019   ALT 17 03/02/2019   PROT 8.9 (H) 03/02/2019   ALBUMIN 4.4 03/02/2019   CALCIUM 8.9 03/06/2019   GFRAA >60 03/06/2019    Speciality Comments: PLQ Eye Exam: 01/16/2019 WNL @ [facility]  Procedures:  No procedures performed Allergies: Pseudoephedrine  hcl, Ancef [cefazolin], Clindamycin/lincomycin, Prednisone, Adhesive [tape], Other, and Sudafed [pseudoephedrine hcl]   Assessment / Plan:     Visit Diagnoses: No diagnosis found.   Orders: No orders of the defined types were placed in this encounter.  No orders of the defined types were placed in this encounter.   Face-to-face time spent with patient was *** minutes. Greater than 50% of time was spent in counseling and coordination of care.  Follow-Up Instructions: No follow-ups on file.   Earnestine Mealing, CMA  Note - This record has been created using Editor, commissioning.  Chart creation errors have been sought, but may not always  have been located. Such creation errors do not reflect on  the standard of medical care.

## 2019-06-25 ENCOUNTER — Encounter: Payer: Self-pay | Admitting: Primary Care

## 2019-06-25 ENCOUNTER — Ambulatory Visit: Payer: BLUE CROSS/BLUE SHIELD | Admitting: Primary Care

## 2019-06-25 ENCOUNTER — Other Ambulatory Visit: Payer: Self-pay

## 2019-06-25 ENCOUNTER — Ambulatory Visit (INDEPENDENT_AMBULATORY_CARE_PROVIDER_SITE_OTHER): Payer: BLUE CROSS/BLUE SHIELD | Admitting: Primary Care

## 2019-06-25 VITALS — BP 124/80 | HR 75 | Temp 98.0°F | Ht 66.0 in | Wt 216.8 lb

## 2019-06-25 DIAGNOSIS — E119 Type 2 diabetes mellitus without complications: Secondary | ICD-10-CM

## 2019-06-25 DIAGNOSIS — Z23 Encounter for immunization: Secondary | ICD-10-CM | POA: Diagnosis not present

## 2019-06-25 LAB — POCT GLYCOSYLATED HEMOGLOBIN (HGB A1C): Hemoglobin A1C: 9.8 % — AB (ref 4.0–5.6)

## 2019-06-25 MED ORDER — INSULIN GLARGINE 100 UNIT/ML SOLOSTAR PEN: 22.0000 [IU] | PEN_INJECTOR | Freq: Every day | SUBCUTANEOUS | 3 refills | Status: DC

## 2019-06-25 MED ORDER — METFORMIN HCL ER 500 MG PO TB24: ORAL_TABLET | ORAL | 3 refills | Status: DC

## 2019-06-25 NOTE — Patient Instructions (Signed)
We've reduced your metformin to 500 mg once daily. Please update me if you continue to experience diarrhea.  We've increased your Lantus insulin to 22 units.  Continue to monitor your blood sugars and notify me if you continue to see readings at or above 200 after a few weeks.  Please schedule a follow up appointment in 3 months for diabetes check.  It was a pleasure to see you today!

## 2019-06-25 NOTE — Addendum Note (Signed)
Addended by: Jacqualin Combes on: 06/25/2019 12:39 PM   Modules accepted: Orders

## 2019-06-25 NOTE — Progress Notes (Signed)
Subjective:    Patient ID: Lauren Winters, female    DOB: 1966/02/20, 53 y.o.   MRN: [id number]  HPI  Lauren Winters is a 53 year old female who presents today for follow up of diabetes and a chief complaint of hyperglycemia.  Current medications include: Metformin ER 1000 mg daily, Lantus 16 units HS. She does experience frequent diarrhea on metformin that is distrustful.   She is checking her blood glucose 2-3 times daily and is getting readings of:  AM fasting: low 200's-mid 300's 2 hours after a meal: mid-high 200's  Glucose readings have been about mid 100's to low 200's prior to these readings. Over the last one month her readings have increased. Nothing in her routine has changed over the last one month.  Last A1C: 9.5 in March 2020 Last Eye Exam: Due in December 2020 Last Foot Exam: Due today Pneumonia Vaccination: Completed in November 2015 ACE/ARB: Valsartan Statin: rosuvastatin   Diet currently consists of:  Breakfast: Cheese toast, cereal  Lunch: Salad Dinner: Grilled protein, vegetables  Snacks: Peanut butter crackers Desserts: None Beverages: Diet soda, water, crystal light   Exercise: She is walking outdoors daily  BP Readings from Last 3 Encounters:  06/25/19 124/80  03/07/19 128/67  03/06/19 (!) 115/58      Review of Systems  Eyes: Negative for visual disturbance.  Respiratory: Negative for shortness of breath.   Cardiovascular: Negative for chest pain.  Gastrointestinal: Positive for diarrhea.  Neurological:       Chronic numbness to plantar feet and toes       Past Medical History:  Diagnosis Date  . Anxiety   . Atypical chest pain    a. ETT 05/2014: normal stress test without evidence of myocardial ischemia or chest pain at peak stress  . Complication of anesthesia    migraine headache  . Depression   . Diabetes mellitus without complication ([place]) metformin   dx 2015  . Dyspnea    on exertion  . Fibromyalgia   . Headache   . Heart  murmur    told back in 2007, but no mention with 2016 cardiac w/u  . Hypertension   . Lupus (systemic lupus erythematosus) ([place])   . Mitral regurgitation    a. 05/2014 echo: EF >55%, nl LV/RV sys fxn, mild MR/AR, no valvular stenosis   . Obesity (BMI 30-39.9)   . Raynaud's phenomenon   . Rheumatoid arthritis ([place])   . Sjogren's disease ([place])   . Spondylolisthesis of lumbar region   . Tobacco abuse    a. ongoing; b. ongoing SOB     Social History   Socioeconomic History  . Marital status: Married    Spouse name: Not on file  . Number of children: Not on file  . Years of education: Not on file  . Highest education level: Not on file  Occupational History  . Not on file  Social Needs  . Financial resource strain: Not on file  . Food insecurity    Worry: Not on file    Inability: Not on file  . Transportation needs    Medical: Not on file    Non-medical: Not on file  Tobacco Use  . Smoking status: Former Smoker    Packs/day: 0.50    Years: 10.00    Pack years: 5.00    Types: Cigarettes    Quit date: 07/22/2015    Years since quitting: 3.9  . Smokeless tobacco: Never Used  Substance and Sexual  Activity  . Alcohol use: No    Alcohol/week: 0.0 standard drinks  . Drug use: No  . Sexual activity: Not Currently  Lifestyle  . Physical activity    Days per week: Not on file    Minutes per session: Not on file  . Stress: Not on file  Relationships  . Social Herbalist on phone: Not on file    Gets together: Not on file    Attends religious service: Not on file    Active member of club or organization: Not on file    Attends meetings of clubs or organizations: Not on file    Relationship status: Not on file  . Intimate partner violence    Fear of current or ex partner: Not on file    Emotionally abused: Not on file    Physically abused: Not on file    Forced sexual activity: Not on file  Other Topics Concern  . Not on file  Social History Narrative    Married.   3 children.   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     Past Surgical History:  Procedure Laterality Date  . ABDOMINAL HYSTERECTOMY    . BACK SURGERY    . CESAREAN SECTION     x3  . LUMBAR LAMINECTOMY    . LUMBAR LAMINECTOMY/DECOMPRESSION MICRODISCECTOMY N/A 03/29/2017   Procedure: Re operative Right Lumbar Three-Four Laminectomy and discectomy with Left Lumbar Three-Four, Bilateral Lumbar Four-Five Laminectomy for decompression;  Surgeon: Kevan Ny Ditty, MD;  Location: [place];  Service: Neurosurgery;  Laterality: N/A;  . SPINAL CORD STIMULATOR INSERTION  12/09/2017   Procedure: LUMBAR SPINAL CORD STIMULATOR IMPLANT;  Surgeon: Eustace Moore, MD;  Location: [place];  Service: Neurosurgery;;  . SPINAL CORD STIMULATOR REMOVAL N/A 01/11/2018   Procedure: LUMBAR SPINAL CORD STIMULATOR REMOVAL;  Surgeon: Eustace Moore, MD;  Location: [place];  Service: Neurosurgery;  Laterality: N/A;  . TUBAL LIGATION      Family History  Problem Relation Age of Onset  . Hypertension Mother   . Valvular heart disease Mother   . CAD Father 42       CABG x 4  . Liver cancer Father 52       passed  . CAD Paternal Aunt        CABG  . Breast cancer Paternal Aunt   . CAD Paternal Aunt        CABG  . Breast cancer Paternal Aunt   . CAD Paternal Aunt        CABG  . Breast cancer Paternal Aunt   . CAD Paternal Aunt        CABG  . Breast cancer Paternal Aunt   . CAD Paternal Aunt        CABG  . Breast cancer Paternal Aunt   . CAD Paternal Uncle        MI s/p CABG  . Stroke Maternal Grandmother   . Stroke Maternal Grandfather   . CAD Maternal Grandfather     Allergies  Allergen Reactions  . Pseudoephedrine Hcl Shortness Of Breath and Other (See Comments)    Room starts spinning, can't breath  . Ancef [Cefazolin] Other (See Comments)    Unknown  . Clindamycin/Lincomycin Hives  . Prednisone Other (See Comments)    Chest pain w/ cortisone shots  or prednisone pills Elevated blood pressure  . Adhesive [Tape] Rash and Other (See Comments)  Sensitivity to certain band aids and adhesives - burns skin, makes skin raw  . Other Rash and Other (See Comments)    Sensitivity to certain band aids and adhesives - burns skin, makes skin raw  . Sudafed [Pseudoephedrine Hcl] Other (See Comments)    Dizziness     Current Outpatient Medications on File Prior to Visit  Medication Sig Dispense Refill  . amLODipine (NORVASC) 10 MG tablet Take 1 tablet once daily for blood pressure. (Patient taking differently: Take 10 mg by mouth daily. ) 90 tablet 3  . Blood Glucose Monitoring Suppl (ONETOUCH VERIO FLEX SYSTEM) w/Device KIT Use as instructed to test blood sugar 3 times daily 1 kit 0  . buPROPion (WELLBUTRIN XL) 150 MG 24 hr tablet Take 1 tablet by mouth once daily for anxiety and depression. (Patient taking differently: Take 150 mg by mouth daily. ) 90 tablet 3  . clobetasol cream (TEMOVATE) 3.47 % Apply 1 application topically 2 (two) times daily as needed (for lupis rash).    . clopidogrel (PLAVIX) 75 MG tablet Take 1 tablet (75 mg total) by mouth daily. 30 tablet 0  . cyclobenzaprine (FLEXERIL) 10 MG tablet Take 1 tablet (10 mg total) by mouth 3 (three) times daily as needed for muscle spasms. 270 tablet 0  . escitalopram (LEXAPRO) 20 MG tablet TAKE 1 TABLET BY MOUTH EVERY DAY 90 tablet 0  . folic acid (FOLVITE) 1 MG tablet Take 2 tablets by mouth daily. 180 tablet 1  . glucose blood test strip Use as instructed to blood sugar 3 times daily 300 each 2  . Halcinonide 0.1 % OINT Apply 1 application topically 2 (two) times daily as needed (for lupis rash).     . hydroxychloroquine (PLAQUENIL) 200 MG tablet Take 1 tablet (200 mg total) by mouth 2 (two) times daily. 180 tablet 0  . Insulin Glargine (LANTUS) 100 UNIT/ML Solostar Pen Inject 16 Units into the skin every evening. 15 mL 0  . Insulin Pen Needle (PEN NEEDLES) 31G X 6 MM MISC Use with insulin  as directed. 100 each 5  . metFORMIN (GLUCOPHAGE-XR) 500 MG 24 hr tablet Take 2 tablets every morning with food for diabetes. (Patient taking differently: Take 1,000 mg by mouth daily with breakfast. ) 180 tablet 3  . methotrexate (RHEUMATREX) 2.5 MG tablet Take 8 tablets (20 mg total) by mouth once a week. Caution:Chemotherapy. Protect from light. 96 tablet 0  . pilocarpine (SALAGEN) 5 MG tablet Take 5 mg by mouth daily as needed (for dry mouth).     . rosuvastatin (CRESTOR) 10 MG tablet Take 1 tablet (10 mg total) by mouth daily at 6 PM. Take 1 tablet by mouth at bedtime for cholesterol. 30 tablet 2  . spironolactone (ALDACTONE) 25 MG tablet Take 25 mg by mouth daily.    . tacrolimus (PROTOPIC) 0.1 % ointment Apply 1 application topically 2 (two) times daily as needed (for lupis rash).     . Triamcinolone Acetonide (TRIAMCINOLONE 0.1 % CREAM : EUCERIN) CREA Apply 1 application topically 2 (two) times daily as needed for rash or irritation.     . valsartan-hydrochlorothiazide (DIOVAN-HCT) 320-25 MG tablet Take 1 tablet by mouth once daily for blood pressure. (Patient taking differently: Take 1 tablet by mouth at bedtime. ) 90 tablet 3  . Vitamin D, Ergocalciferol, (DRISDOL) 1.25 MG (50000 UT) CAPS capsule Take 1 capsule (50,000 Units total) by mouth 2 (two) times a week. 24 capsule 0  . gabapentin (NEURONTIN) 300 MG capsule  Take 2 capsules (600 mg total) by mouth 3 (three) times daily for 30 days. 180 capsule 0   No current facility-administered medications on file prior to visit.     BP 124/80   Pulse 75   Temp 98 F (36.7 C) (Oral)   Ht _0  (1.676 m)   Wt 216 lb 12 oz (98.3 kg)   SpO2 98%   BMI 34.98 kg/m    Objective:   Physical Exam  Constitutional: She appears well-nourished.  Neck: Neck supple.  Cardiovascular: Normal rate and regular rhythm.  Respiratory: Effort normal and breath sounds normal.  Skin: Skin is warm and dry.  Psychiatric: She has a normal mood and affect.            Assessment & Plan:

## 2019-06-25 NOTE — Assessment & Plan Note (Addendum)
Hyperglycemia over the last one month without changes in diet or medications. No increased stress.  A1C today of 9.8 which is an increase from 9.5 in March 2020.  Managed on ARB and statin. Foot exam today. Pneumovax due and provided.  Increase Lantus to 22 unitsHS. Reduce metformin to 500 mg daily due to diarrhea. She will update if diarrhea persists. Continue to monitor glucose readings. Follow up in 3 months.

## 2019-07-03 NOTE — Progress Notes (Signed)
Office Visit Note  Patient: Lauren Winters             Date of Birth: 11-18-1966           MRN: [id number]             PCP: Pleas Koch, NP Referring: Pleas Koch, NP Visit Date: 07/05/2019 Occupation: @GUAROCC @  Subjective:  Increased fatigue.   History of Present Illness: Lauren Winters is a 53 y.o. female with history of systemic lupus, osteoarthritis, fibromyalgia and degenerative disc disease.  She states she continues to have fatigue.  She frequently has sores in her nose and mouth.  She also has sicca symptoms.  She has been taking her medications on a regular basis.  She denies any joint swelling.  She states that she has been having a lot of lower back discomfort.  She also has been having pain in bilateral hips which she describes over the trochanteric bursa.  She has nocturnal pain when she sleeps on her sides.  Activities of Daily Living:  Patient reports morning stiffness for several hours.   Patient Reports nocturnal pain.  Difficulty dressing/grooming: Reports Difficulty climbing stairs: Reports Difficulty getting out of chair: Reports Difficulty using hands for taps, buttons, cutlery, and/or writing: Reports  Review of Systems  Constitutional: Positive for fatigue. Negative for night sweats, weight gain and weight loss.  HENT: Positive for mouth sores, mouth dryness and nose dryness. Negative for trouble swallowing and trouble swallowing.   Eyes: Positive for dryness. Negative for pain, redness, itching and visual disturbance.  Respiratory: Negative for cough, shortness of breath, wheezing and difficulty breathing.   Cardiovascular: Negative for chest pain, palpitations, hypertension, irregular heartbeat and swelling in legs/feet.  Gastrointestinal: Negative for abdominal pain, blood in stool, constipation and diarrhea.  Endocrine: Negative for increased urination.  Genitourinary: Negative for difficulty urinating, painful urination, pelvic pain and  vaginal dryness.  Musculoskeletal: Positive for arthralgias, joint pain and morning stiffness. Negative for joint swelling, myalgias, muscle weakness, muscle tenderness and myalgias.  Skin: Negative for color change, rash, hair loss, redness, skin tightness, ulcers and sensitivity to sunlight.  Allergic/Immunologic: Negative for susceptible to infections.  Neurological: Positive for weakness. Negative for dizziness, light-headedness, memory loss and night sweats.  Hematological: Negative for swollen glands.  Psychiatric/Behavioral: Negative for depressed mood, confusion and sleep disturbance. The patient is not nervous/anxious.     PMFS History:  Patient Active Problem List   Diagnosis Date Noted  . Paresthesias/numbness 03/04/2019  . Paresthesias 03/02/2019  . CVA (cerebral vascular accident) ([place]) 08/19/2018  . S/P lumbar spinal fusion 05/26/2018  . High risk medication use 02/03/2018  . Wound infection after surgery 01/11/2018  . S/P lumbar laminectomy 12/09/2017  . Paresthesia 05/11/2017  . HNP (herniated nucleus pulposus), lumbar 03/29/2017  . Chronic right shoulder pain 02/18/2017  . Primary osteoarthritis of both hands 12/22/2016  . Primary osteoarthritis of both knees 12/22/2016  . Spondylosis of lumbar region without myelopathy or radiculopathy 12/22/2016  . Fibromyalgia 12/22/2016  . Preventative health care 04/19/2016  . Chronic back pain 12/17/2015  . Hyperlipidemia 08/29/2015  . Other organ or system involvement in systemic lupus erythematosus ([place]) 08/04/2015  . Rheumatoid factor positive 08/04/2015  . Sjogren's syndrome ([place]) 08/04/2015  . Mitral regurgitation   . Tobacco abuse   . Atypical chest pain   . Obesity (BMI 30-39.9)   . Anxiety   . Hypertension   . Diabetes mellitus without complication ([place])   . Clinical  depression 05/23/2014    Past Medical History:  Diagnosis Date  . Anxiety   . Atypical chest pain    a. ETT 05/2014: normal stress test without  evidence of myocardial ischemia or chest pain at peak stress  . Complication of anesthesia    migraine headache  . Depression   . Diabetes mellitus without complication ([place]) metformin   dx 2015  . Dyspnea    on exertion  . Fibromyalgia   . Headache   . Heart murmur    told back in 2007, but no mention with 2016 cardiac w/u  . Hypertension   . Lupus (systemic lupus erythematosus) ([place])   . Mitral regurgitation    a. 05/2014 echo: EF >55%, nl LV/RV sys fxn, mild MR/AR, no valvular stenosis   . Obesity (BMI 30-39.9)   . Raynaud's phenomenon   . Rheumatoid arthritis ([place])   . Sjogren's disease ([place])   . Spondylolisthesis of lumbar region   . Tobacco abuse    a. ongoing; b. ongoing SOB    Family History  Problem Relation Age of Onset  . Hypertension Mother   . Valvular heart disease Mother   . CAD Father 69       CABG x 4  . Liver cancer Father 34       passed  . CAD Paternal Aunt        CABG  . Breast cancer Paternal Aunt   . CAD Paternal Aunt        CABG  . Breast cancer Paternal Aunt   . CAD Paternal Aunt        CABG  . Breast cancer Paternal Aunt   . CAD Paternal Aunt        CABG  . Breast cancer Paternal Aunt   . CAD Paternal Aunt        CABG  . Breast cancer Paternal Aunt   . CAD Paternal Uncle        MI s/p CABG  . Stroke Maternal Grandmother   . Stroke Maternal Grandfather   . CAD Maternal Grandfather    Past Surgical History:  Procedure Laterality Date  . ABDOMINAL HYSTERECTOMY    . BACK SURGERY    . CESAREAN SECTION     x3  . LUMBAR LAMINECTOMY    . LUMBAR LAMINECTOMY/DECOMPRESSION MICRODISCECTOMY N/A 03/29/2017   Procedure: Re operative Right Lumbar Three-Four Laminectomy and discectomy with Left Lumbar Three-Four, Bilateral Lumbar Four-Five Laminectomy for decompression;  Surgeon: Kevan Ny Ditty, MD;  Location: [facility];  Service: Neurosurgery;  Laterality: N/A;  . SPINAL CORD STIMULATOR INSERTION  12/09/2017   Procedure: LUMBAR SPINAL CORD  STIMULATOR IMPLANT;  Surgeon: Eustace Moore, MD;  Location: [facility];  Service: Neurosurgery;;  . SPINAL CORD STIMULATOR REMOVAL N/A 01/11/2018   Procedure: LUMBAR SPINAL CORD STIMULATOR REMOVAL;  Surgeon: Eustace Moore, MD;  Location: [facility];  Service: Neurosurgery;  Laterality: N/A;  . TUBAL LIGATION     Social History   Social History Narrative   Married.   3 children.   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    Immunization History  Administered Date(s) Administered  . Influenza,inj,Quad PF,6+ Mos 10/19/2016, 10/21/2017, 01/31/2019  . Pneumococcal Polysaccharide-23 10/27/2014, 06/25/2019  . Td 04/19/2016  . Zoster 10/27/2014     Objective: Vital Signs: BP (!) 157/89 (BP Location: Left Arm, Patient Position: Sitting, Cuff Size: Normal)   Pulse 69   Resp 14   Ht 5'  6" (1.676 m)   Wt 222 lb (100.7 kg)   BMI 35.83 kg/m    Physical Exam Vitals signs and nursing note reviewed.  Constitutional:      Appearance: She is well-developed.  HENT:     Head: Normocephalic and atraumatic.  Eyes:     Conjunctiva/sclera: Conjunctivae normal.  Neck:     Musculoskeletal: Normal range of motion.  Cardiovascular:     Rate and Rhythm: Normal rate and regular rhythm.     Heart sounds: Normal heart sounds.  Pulmonary:     Effort: Pulmonary effort is normal.     Breath sounds: Normal breath sounds.  Abdominal:     General: Bowel sounds are normal.     Palpations: Abdomen is soft.  Lymphadenopathy:     Cervical: No cervical adenopathy.  Skin:    General: Skin is warm and dry.     Capillary Refill: Capillary refill takes less than 2 seconds.  Neurological:     Mental Status: She is alert and oriented to person, place, and time.  Psychiatric:        Behavior: Behavior normal.      Musculoskeletal Exam: Patient had good range of motion of her cervical spine.  She has very limited painful range of motion of her lumbar spine.  Shoulder joints, elbow  joints, wrist joints with good range of motion with no synovitis.  She has DIP and PIP thickening with no synovitis.  Hip joints were difficult to assess as she had lot of lower back discomfort.  She had tenderness on palpation of bilateral trochanteric bursa.  Knee joints with good range of motion without any warmth swelling or effusion. CDAI Exam: CDAI Score: - Patient Global: -; Provider Global: - Swollen: -; Tender: - Joint Exam   No joint exam has been documented for this visit   There is currently no information documented on the homunculus. Go to the Rheumatology activity and complete the homunculus joint exam.  Investigation: No additional findings.  Imaging: No results found.  Recent Labs: Lab Results  Component Value Date   WBC 8.0 03/06/2019   HGB 12.2 03/06/2019   PLT 345 03/06/2019   NA 137 03/06/2019   K 3.2 (L) 03/06/2019   CL 98 03/06/2019   CO2 29 03/06/2019   GLUCOSE 164 (H) 03/06/2019   BUN 18 03/06/2019   CREATININE 0.95 03/06/2019   BILITOT 1.0 03/02/2019   ALKPHOS 101 03/02/2019   AST 26 03/02/2019   ALT 17 03/02/2019   PROT 8.9 (H) 03/02/2019   ALBUMIN 4.4 03/02/2019   CALCIUM 8.9 03/06/2019   GFRAA >60 03/06/2019    Speciality Comments: PLQ Eye Exam: 01/16/2019 WNL @ [facility]  Procedures:  No procedures performed Allergies: Pseudoephedrine hcl, Ancef [cefazolin], Clindamycin/lincomycin, Prednisone, Adhesive [tape], Other, and Sudafed [pseudoephedrine hcl]   Assessment / Plan:     Visit Diagnoses: Other organ or system involvement in systemic lupus erythematosus (HCC) - +La, oral ulcers, malar rash, fatigue, photosensitivity, Raynaud's, Subcutaneous lupus-bx+, hair loss, +RF, sicca symptoms -patient continues to have symptoms of oral and nasal ulcers.  She also gives history of rainouts phenomenon and arthralgias.  No synovitis was noted on examination today.  I will obtain additional labs today.  High risk medication use - MTX 8 tabs po  once weekly, folic acid 2 mg po daily, PLQ 200 mg 1 tablet BID.  I will obtain labs today and then every 3 months to monitor for drug toxicity.  We will check  labs today and then every 3 months to monitor for drug toxicity.  Prescription refills for methotrexate, Plaquenil and folic acid were given.  Recurrent oral ulcers   Sjogren's syndrome with other organ involvement ([place]) - Plan: Over-the-counter products were discussed.  Raynaud's syndrome without gangrene - Plan: Keeping core temperature warm discussed.  Trochanteric bursitis, left hip -she has bilateral trochanteric bursitis more prominent on the left side.  Have given her a handout on IT band exercises.  I would avoid cortisone injection as she is diabetic.  I also offered physical therapy which she declined.  Primary osteoarthritis of both hands - Plan: Joint protection was discussed.  Primary osteoarthritis of both knees -she had no warmth or swelling.  Weight loss will be helpful.  Although it is been difficult for her to exercise due to lower back pain.  Fibromyalgia -she continues to have some generalized pain and discomfort.  S/P lumbar laminectomy -she has chronic pain.  Diabetes mellitus without complication ([place])   Essential hypertension - Plan: Her blood pressure was elevated today.  Have advised her to monitor blood pressure and follow-up with her PCP.  Former smoker  Vitamin D deficiency - Plan: I will check her vitamin D level again today.  Orders: Orders Placed This Encounter  Procedures  . CBC with Differential/Platelet  . COMPLETE METABOLIC PANEL WITH GFR  . Urinalysis, Routine w reflex microscopic  . ANA  . Anti-DNA antibody, double-stranded  . C3 and C4  . Sedimentation rate  . VITAMIN D 25 Hydroxy (Vit-D Deficiency, Fractures)   Meds ordered this encounter  Medications  . folic acid (FOLVITE) 1 MG tablet    Sig: Take 2 tablets by mouth daily.    Dispense:  180 tablet    Refill:  1  .  hydroxychloroquine (PLAQUENIL) 200 MG tablet    Sig: Take 1 tablet (200 mg total) by mouth 2 (two) times daily.    Dispense:  180 tablet    Refill:  0  . methotrexate (RHEUMATREX) 2.5 MG tablet    Sig: Take 8 tablets (20 mg total) by mouth once a week. Caution:Chemotherapy. Protect from light.    Dispense:  96 tablet    Refill:  0    Face-to-face time spent with patient was 30 minutes. Greater than 50% of time was spent in counseling and coordination of care.  Follow-Up Instructions: Return in about 5 months (around 12/05/2019) for Systemic lupus.   Bo Merino, MD  Note - This record has been created using Editor, commissioning.  Chart creation errors have been sought, but may not always  have been located. Such creation errors do not reflect on  the standard of medical care.

## 2019-07-04 ENCOUNTER — Ambulatory Visit: Payer: Self-pay | Admitting: Physician Assistant

## 2019-07-05 ENCOUNTER — Ambulatory Visit: Payer: BLUE CROSS/BLUE SHIELD | Admitting: Rheumatology

## 2019-07-05 ENCOUNTER — Other Ambulatory Visit: Payer: Self-pay

## 2019-07-05 ENCOUNTER — Encounter: Payer: Self-pay | Admitting: Physician Assistant

## 2019-07-05 VITALS — BP 157/89 | HR 69 | Resp 14 | Ht 66.0 in | Wt 222.0 lb

## 2019-07-05 DIAGNOSIS — E559 Vitamin D deficiency, unspecified: Secondary | ICD-10-CM

## 2019-07-05 DIAGNOSIS — M3219 Other organ or system involvement in systemic lupus erythematosus: Secondary | ICD-10-CM | POA: Diagnosis not present

## 2019-07-05 DIAGNOSIS — Z9889 Other specified postprocedural states: Secondary | ICD-10-CM

## 2019-07-05 DIAGNOSIS — M797 Fibromyalgia: Secondary | ICD-10-CM

## 2019-07-05 DIAGNOSIS — Z87891 Personal history of nicotine dependence: Secondary | ICD-10-CM

## 2019-07-05 DIAGNOSIS — I10 Essential (primary) hypertension: Secondary | ICD-10-CM

## 2019-07-05 DIAGNOSIS — K1379 Other lesions of oral mucosa: Secondary | ICD-10-CM | POA: Diagnosis not present

## 2019-07-05 DIAGNOSIS — Z79899 Other long term (current) drug therapy: Secondary | ICD-10-CM

## 2019-07-05 DIAGNOSIS — M3509 Sicca syndrome with other organ involvement: Secondary | ICD-10-CM

## 2019-07-05 DIAGNOSIS — M19041 Primary osteoarthritis, right hand: Secondary | ICD-10-CM

## 2019-07-05 DIAGNOSIS — M170 Bilateral primary osteoarthritis of knee: Secondary | ICD-10-CM

## 2019-07-05 DIAGNOSIS — I7300 Raynaud's syndrome without gangrene: Secondary | ICD-10-CM

## 2019-07-05 DIAGNOSIS — E119 Type 2 diabetes mellitus without complications: Secondary | ICD-10-CM

## 2019-07-05 DIAGNOSIS — M7062 Trochanteric bursitis, left hip: Secondary | ICD-10-CM

## 2019-07-05 DIAGNOSIS — M19042 Primary osteoarthritis, left hand: Secondary | ICD-10-CM

## 2019-07-05 MED ORDER — FOLIC ACID 1 MG PO TABS: ORAL_TABLET | ORAL | 1 refills | Status: DC

## 2019-07-05 MED ORDER — HYDROXYCHLOROQUINE SULFATE 200 MG PO TABS: 200.0000 mg | ORAL_TABLET | Freq: Two times a day (BID) | ORAL | 0 refills | Status: DC

## 2019-07-05 MED ORDER — METHOTREXATE 2.5 MG PO TABS: 20.0000 mg | ORAL_TABLET | ORAL | 0 refills | Status: DC

## 2019-07-05 NOTE — Patient Instructions (Addendum)
Iliotibial Band Syndrome Rehab Ask your health care provider which exercises are safe for you. Do exercises exactly as told by your health care provider and adjust them as directed. It is normal to feel mild stretching, pulling, tightness, or discomfort as you do these exercises. Stop right away if you feel sudden pain or your pain gets significantly worse. Do not begin these exercises until told by your health care provider. Stretching and range-of-motion exercises These exercises warm up your muscles and joints and improve the movement and flexibility of your hip and pelvis. Quadriceps stretch, prone  1. Lie on your abdomen on a firm surface, such as a bed or padded floor (prone position). 2. Bend your left / right knee and reach back to hold your ankle or pant leg. If you cannot reach your ankle or pant leg, loop a belt around your foot and grab the belt instead. 3. Gently pull your heel toward your buttocks. Your knee should not slide out to the side. You should feel a stretch in the front of your thigh and knee (quadriceps). 4. Hold this position for __________ seconds. Repeat __________ times. Complete this exercise __________ times a day. Iliotibial band stretch An iliotibial band is a strong band of muscle tissue that runs from the outer side of your hip to the outer side of your thigh and knee. 1. Lie on your side with your left / right leg in the top position. 2. Bend both of your knees and grab your left / right ankle. Stretch out your bottom arm to help you balance. 3. Slowly bring your top knee back so your thigh goes behind your trunk. 4. Slowly lower your top leg toward the floor until you feel a gentle stretch on the outside of your left / right hip and thigh. If you do not feel a stretch and your knee will not fall farther, place the heel of your other foot on top of your knee and pull your knee down toward the floor with your foot. 5. Hold this position for __________ seconds.  Repeat __________ times. Complete this exercise __________ times a day. Strengthening exercises These exercises build strength and endurance in your hip and pelvis. Endurance is the ability to use your muscles for a long time, even after they get tired. Straight leg raises, side-lying This exercise strengthens the muscles that rotate the leg at the hip and move it away from your body (hip abductors). 1. Lie on your side with your left / right leg in the top position. Lie so your head, shoulder, hip, and knee line up. You may bend your bottom knee to help you balance. 2. Roll your hips slightly forward so your hips are stacked directly over each other and your left / right knee is facing forward. 3. Tense the muscles in your outer thigh and lift your top leg 4-6 inches (10-15 cm). 4. Hold this position for __________ seconds. 5. Slowly return to the starting position. Let your muscles relax completely before doing another repetition. Repeat __________ times. Complete this exercise __________ times a day. Leg raises, prone This exercise strengthens the muscles that move the hips (hip extensors). 1. Lie on your abdomen on your bed or a firm surface. You can put a pillow under your hips if that is more comfortable for your lower back. 2. Bend your left / right knee so your foot is straight up in the air. 3. Squeeze your buttocks muscles and lift your left / right thigh   off the bed. Do not let your back arch. 4. Tense your thigh muscle as hard as you can without increasing any knee pain. 5. Hold this position for __________ seconds. 6. Slowly lower your leg to the starting position and allow it to relax completely. Repeat __________ times. Complete this exercise __________ times a day. Hip hike 1. Stand sideways on a bottom step. Stand on your left / right leg with your other foot unsupported next to the step. You can hold on to the railing or wall for balance if needed. 2. Keep your knees straight  and your torso square. Then lift your left / right hip up toward the ceiling. 3. Slowly let your left / right hip lower toward the floor, past the starting position. Your foot should get closer to the floor. Do not lean or bend your knees. Repeat __________ times. Complete this exercise __________ times a day. This information is not intended to replace advice given to you by your health care provider. Make sure you discuss any questions you have with your health care provider. Document Released: 12/13/2005 Document Revised: 04/05/2019 Document Reviewed: 10/04/2018 Elsevier Patient Education  2020 [place] We placed an order today for your standing lab work.    Please come back and get your standing labs in October and every 3 months  We have open lab daily Monday through Thursday from 8:30-12:30 PM and 1:30-4:30 PM and Friday from 8:30-12:30 PM and 1:30 -4:00 PM at the office of Dr. Bo Merino.   You may experience shorter wait times on Monday and Friday afternoons. The office is located at [address], [place], [place], [place] [address] No appointment is necessary.   Labs are drawn by [facility].  You may receive a bill from [facility] for your lab work.  If you wish to have your labs drawn at another location, please call the office 24 hours in advance to send orders.  If you have any questions regarding directions or hours of operation,  please call [phone].   Just as a reminder please drink plenty of water prior to coming for your lab work. Thanks!

## 2019-07-07 ENCOUNTER — Other Ambulatory Visit: Payer: Self-pay

## 2019-07-07 DIAGNOSIS — M545 Low back pain, unspecified: Secondary | ICD-10-CM

## 2019-07-07 DIAGNOSIS — G8929 Other chronic pain: Secondary | ICD-10-CM

## 2019-07-07 LAB — URINALYSIS, ROUTINE W REFLEX MICROSCOPIC
Bacteria, UA: NONE SEEN /HPF
Bilirubin Urine: NEGATIVE
Hgb urine dipstick: NEGATIVE
Hyaline Cast: NONE SEEN /LPF
Ketones, ur: NEGATIVE
Leukocytes,Ua: NEGATIVE
Nitrite: NEGATIVE
RBC / HPF: NONE SEEN /HPF (ref 0–2)
Specific Gravity, Urine: 1.023 (ref 1.001–1.03)
pH: <=5 (ref 5.0–8.0)

## 2019-07-07 LAB — COMPLETE METABOLIC PANEL WITH GFR
AG Ratio: 1.2 (calc) (ref 1.0–2.5)
ALT: 11 U/L (ref 6–29)
AST: 15 U/L (ref 10–35)
Albumin: 3.9 g/dL (ref 3.6–5.1)
Alkaline phosphatase (APISO): 94 U/L (ref 37–153)
BUN: 10 mg/dL (ref 7–25)
CO2: 28 mmol/L (ref 20–32)
Calcium: 9.4 mg/dL (ref 8.6–10.4)
Chloride: 104 mmol/L (ref 98–110)
Creat: 0.83 mg/dL (ref 0.50–1.05)
GFR, Est African American: 92 mL/min/{1.73_m2} (ref >=60)
GFR, Est Non African American: 79 mL/min/{1.73_m2} (ref >=60)
Globulin: 3.3 g/dL (calc) (ref 1.9–3.7)
Glucose, Bld: 233 mg/dL — ABNORMAL HIGH (ref 65–99)
Potassium: 4.3 mmol/L (ref 3.5–5.3)
Sodium: 139 mmol/L (ref 135–146)
Total Bilirubin: 0.4 mg/dL (ref 0.2–1.2)
Total Protein: 7.2 g/dL (ref 6.1–8.1)

## 2019-07-07 LAB — CBC WITH DIFFERENTIAL/PLATELET
Absolute Monocytes: 365 cells/uL (ref 200–950)
Basophils Absolute: 58 cells/uL (ref 0–200)
Basophils Relative: 0.9 %
Eosinophils Absolute: 160 cells/uL (ref 15–500)
Eosinophils Relative: 2.5 %
HCT: 39.7 % (ref 35.0–45.0)
Hemoglobin: 13.1 g/dL (ref 11.7–15.5)
Lymphs Abs: 1722 cells/uL (ref 850–3900)
MCH: 27.5 pg (ref 27.0–33.0)
MCHC: 33 g/dL (ref 32.0–36.0)
MCV: 83.2 fL (ref 80.0–100.0)
MPV: 9.5 fL (ref 7.5–12.5)
Monocytes Relative: 5.7 %
Neutro Abs: 4096 cells/uL (ref 1500–7800)
Neutrophils Relative %: 64 %
Platelets: 302 10*3/uL (ref 140–400)
RBC: 4.77 10*6/uL (ref 3.80–5.10)
RDW: 13.9 % (ref 11.0–15.0)
Total Lymphocyte: 26.9 %
WBC: 6.4 10*3/uL (ref 3.8–10.8)

## 2019-07-07 LAB — ANTI-DNA ANTIBODY, DOUBLE-STRANDED: ds DNA Ab: <1 IU/mL

## 2019-07-07 LAB — C3 AND C4
C3 Complement: 149 mg/dL (ref 83–193)
C4 Complement: 20 mg/dL (ref 15–57)

## 2019-07-07 LAB — ANTI-NUCLEAR AB-TITER (ANA TITER): ANA Titer 1: 1:80 {titer} — ABNORMAL HIGH

## 2019-07-07 LAB — SEDIMENTATION RATE: Sed Rate: 19 mm/h (ref 0–30)

## 2019-07-07 LAB — VITAMIN D 25 HYDROXY (VIT D DEFICIENCY, FRACTURES): Vit D, 25-Hydroxy: 16 ng/mL — ABNORMAL LOW (ref 30–100)

## 2019-07-07 LAB — ANA: Anti Nuclear Antibody (ANA): POSITIVE — AB

## 2019-07-09 ENCOUNTER — Telehealth: Payer: Self-pay | Admitting: *Deleted

## 2019-07-09 ENCOUNTER — Other Ambulatory Visit: Payer: Self-pay | Admitting: Primary Care

## 2019-07-09 DIAGNOSIS — I10 Essential (primary) hypertension: Secondary | ICD-10-CM

## 2019-07-09 DIAGNOSIS — E559 Vitamin D deficiency, unspecified: Secondary | ICD-10-CM

## 2019-07-09 DIAGNOSIS — M3219 Other organ or system involvement in systemic lupus erythematosus: Secondary | ICD-10-CM

## 2019-07-09 MED ORDER — VITAMIN D (ERGOCALCIFEROL) 1.25 MG (50000 UNIT) PO CAPS: 50000.0000 [IU] | ORAL_CAPSULE | ORAL | 0 refills | Status: DC

## 2019-07-09 MED ORDER — CYCLOBENZAPRINE HCL 10 MG PO TABS: 10.0000 mg | ORAL_TABLET | Freq: Three times a day (TID) | ORAL | 0 refills | Status: DC | PRN

## 2019-07-09 NOTE — Telephone Encounter (Signed)
-----   Message from Ofilia Neas, PA-C sent at 07/06/2019 12:20 PM EDT ----- Vitamin D is low.  Please notify patient and send in a prescription for vitamin D 50,000 units by mouth twice weekly for 3 months.  Recheck vitamin D in 3 months.  CBC WNL.  Sed rate WNL.  Complements WNL. Glucose is very elevated-233. Rest of CMP WNL. UA revealed 1+ glucose and trace protein.  Please order protein creatinine ratio.

## 2019-07-31 ENCOUNTER — Telehealth: Payer: Self-pay | Admitting: *Deleted

## 2019-07-31 NOTE — Telephone Encounter (Signed)
Zoe with [employer]?  [employer] left a voicemail wanting to know if Allie Bossier NP got the questionnaire that they faxed  that needs to be completed for the patient? Please call back and advise when calling back the reference # [id number]

## 2019-08-01 ENCOUNTER — Ambulatory Visit: Payer: Self-pay | Admitting: Primary Care

## 2019-08-01 NOTE — Telephone Encounter (Signed)
So far I have not seen this.

## 2019-08-01 NOTE — Telephone Encounter (Signed)
I have not come across this for patient.

## 2019-08-07 NOTE — Telephone Encounter (Signed)
Message left for patient to return my call.  

## 2019-08-07 NOTE — Telephone Encounter (Signed)
Ann with [facility] request update on status of questionnaire that was sent for completion. Ref #[id number].

## 2019-08-08 NOTE — Telephone Encounter (Signed)
Spoken to patient on 08/07/2019 and she is having [employer] to refax the questionnaire.

## 2019-08-09 NOTE — Telephone Encounter (Signed)
Received the questionnaire and placed in [employer].

## 2019-08-12 ENCOUNTER — Other Ambulatory Visit: Payer: Self-pay | Admitting: Primary Care

## 2019-08-12 DIAGNOSIS — M19041 Primary osteoarthritis, right hand: Secondary | ICD-10-CM

## 2019-08-12 NOTE — Telephone Encounter (Signed)
Completed and will place in Chan's inbox for faxing.

## 2019-08-13 NOTE — Telephone Encounter (Signed)
Lauren Winters, can you provide her with a copy? This is the form I handed you this morning.

## 2019-08-27 NOTE — Telephone Encounter (Signed)
Waiting for paperwork still from Dr Diona Browner to sign and give back.

## 2019-08-28 ENCOUNTER — Other Ambulatory Visit: Payer: Self-pay | Admitting: Primary Care

## 2019-08-28 ENCOUNTER — Encounter: Payer: Self-pay | Admitting: Rheumatology

## 2019-08-28 DIAGNOSIS — I10 Essential (primary) hypertension: Secondary | ICD-10-CM

## 2019-08-28 NOTE — Telephone Encounter (Signed)
Lauren Winters, can you handle this?

## 2019-09-05 NOTE — Telephone Encounter (Signed)
Fax as requested and placed a copy in the mail for patient.

## 2019-09-09 DIAGNOSIS — G8929 Other chronic pain: Secondary | ICD-10-CM

## 2019-09-25 ENCOUNTER — Ambulatory Visit: Payer: BLUE CROSS/BLUE SHIELD | Admitting: Primary Care

## 2019-09-25 NOTE — Telephone Encounter (Signed)
Terri, see my message regarding the requested labs from this patient's neurologist. Is this something that our lab even tests?

## 2019-09-26 NOTE — Telephone Encounter (Signed)
Place the fax in [facility]

## 2019-10-05 ENCOUNTER — Other Ambulatory Visit: Payer: Self-pay | Admitting: Primary Care

## 2019-10-05 DIAGNOSIS — F419 Anxiety disorder, unspecified: Secondary | ICD-10-CM

## 2019-10-05 DIAGNOSIS — F329 Major depressive disorder, single episode, unspecified: Secondary | ICD-10-CM

## 2019-10-05 DIAGNOSIS — F32A Depression, unspecified: Secondary | ICD-10-CM

## 2019-10-09 ENCOUNTER — Ambulatory Visit: Payer: BLUE CROSS/BLUE SHIELD | Admitting: Primary Care

## 2019-10-15 DIAGNOSIS — I499 Cardiac arrhythmia, unspecified: Secondary | ICD-10-CM

## 2019-10-19 ENCOUNTER — Other Ambulatory Visit: Payer: Self-pay | Admitting: Rheumatology

## 2019-10-19 DIAGNOSIS — M160 Bilateral primary osteoarthritis of hip: Secondary | ICD-10-CM | POA: Insufficient documentation

## 2019-10-20 ENCOUNTER — Encounter: Payer: Self-pay | Admitting: Rheumatology

## 2019-10-22 MED ORDER — HYDROXYCHLOROQUINE SULFATE 200 MG PO TABS: 200.0000 mg | ORAL_TABLET | Freq: Two times a day (BID) | ORAL | 0 refills | Status: DC

## 2019-10-22 MED ORDER — METHOTREXATE 2.5 MG PO TABS: 20.0000 mg | ORAL_TABLET | ORAL | 0 refills | Status: DC

## 2019-10-22 NOTE — Telephone Encounter (Signed)
CBC/CMP on 10/19/19 in [facility]

## 2019-10-22 NOTE — Telephone Encounter (Signed)
Last Visit: 07/05/19 Next visit: 12/13/19 Labs: 07/05/19 CBC WNL. Glucose is very elevated-233. Rest of CMP WNL PLQ Eye Exam: 01/16/2019 WNL   Okay to refill per Dr. Estanislado Pandy

## 2019-10-22 NOTE — Telephone Encounter (Signed)
Please review if they have CBC and CMP on her from [facility]

## 2019-10-28 HISTORY — PX: BONE MARROW BIOPSY: SHX199

## 2019-11-13 DIAGNOSIS — I639 Cerebral infarction, unspecified: Secondary | ICD-10-CM

## 2019-11-13 MED ORDER — CLOPIDOGREL BISULFATE 75 MG PO TABS: 75.0000 mg | ORAL_TABLET | Freq: Every day | ORAL | 0 refills | Status: DC

## 2019-12-13 ENCOUNTER — Ambulatory Visit: Payer: BLUE CROSS/BLUE SHIELD | Admitting: Rheumatology

## 2020-01-10 ENCOUNTER — Other Ambulatory Visit: Payer: Self-pay | Admitting: Primary Care

## 2020-01-10 DIAGNOSIS — F419 Anxiety disorder, unspecified: Secondary | ICD-10-CM

## 2020-01-12 ENCOUNTER — Other Ambulatory Visit: Payer: Self-pay | Admitting: Rheumatology

## 2020-01-14 NOTE — Telephone Encounter (Signed)
Last Visit: 07/05/19 Next visit: 01/23/20 Labs: CBC/CMP on 10/19/19   Okay to refill per Dr. Estanislado Pandy

## 2020-01-18 ENCOUNTER — Other Ambulatory Visit: Payer: Self-pay | Admitting: Rheumatology

## 2020-01-18 NOTE — Telephone Encounter (Addendum)
Last Visit: 07/05/19 Next visit: 01/23/20 Labs: 07/05/19 CBC WNL. Glucose is very elevated-233. Rest of CMP WNL. PLQ Eye Exam: 01/16/2019 WNL.  Okay to refill per Dr. Estanislado Pandy

## 2020-01-23 ENCOUNTER — Ambulatory Visit: Payer: BLUE CROSS/BLUE SHIELD | Admitting: Rheumatology

## 2020-01-23 NOTE — Progress Notes (Deleted)
Office Visit Note  Patient: Lauren Winters             Date of Birth: 19-Mar-1966           MRN: [id number]             PCP: Pleas Koch, NP Referring: Pleas Koch, NP Visit Date: 01/23/2020 Occupation: @GUAROCC @  Subjective:  No chief complaint on file.   History of Present Illness: Lauren Winters is a 54 y.o. female ***   Activities of Daily Living:  Patient reports morning stiffness for *** {minute/hour:19697}.   Patient {ACTIONS;DENIES/REPORTS:21021675::"Denies"} nocturnal pain.  Difficulty dressing/grooming: {ACTIONS;DENIES/REPORTS:21021675::"Denies"} Difficulty climbing stairs: {ACTIONS;DENIES/REPORTS:21021675::"Denies"} Difficulty getting out of chair: {ACTIONS;DENIES/REPORTS:21021675::"Denies"} Difficulty using hands for taps, buttons, cutlery, and/or writing: {ACTIONS;DENIES/REPORTS:21021675::"Denies"}  No Rheumatology ROS completed.   PMFS History:  Patient Active Problem List   Diagnosis Date Noted  . Paresthesias/numbness 03/04/2019  . Paresthesias 03/02/2019  . CVA (cerebral vascular accident) ([place]) 08/19/2018  . S/P lumbar spinal fusion 05/26/2018  . High risk medication use 02/03/2018  . Wound infection after surgery 01/11/2018  . S/P lumbar laminectomy 12/09/2017  . Paresthesia 05/11/2017  . HNP (herniated nucleus pulposus), lumbar 03/29/2017  . Chronic right shoulder pain 02/18/2017  . Primary osteoarthritis of both hands 12/22/2016  . Primary osteoarthritis of both knees 12/22/2016  . Spondylosis of lumbar region without myelopathy or radiculopathy 12/22/2016  . Fibromyalgia 12/22/2016  . Preventative health care 04/19/2016  . Chronic back pain 12/17/2015  . Hyperlipidemia 08/29/2015  . Other organ or system involvement in systemic lupus erythematosus ([place]) 08/04/2015  . Rheumatoid factor positive 08/04/2015  . Sjogren's syndrome ([place]) 08/04/2015  . Mitral regurgitation   . Tobacco abuse   . Atypical chest pain   . Obesity (BMI  30-39.9)   . Anxiety   . Hypertension   . Diabetes mellitus without complication ([place])   . Clinical depression 05/23/2014    Past Medical History:  Diagnosis Date  . Anxiety   . Atypical chest pain    a. ETT 05/2014: normal stress test without evidence of myocardial ischemia or chest pain at peak stress  . Complication of anesthesia    migraine headache  . Depression   . Diabetes mellitus without complication ([place]) metformin   dx 2015  . Dyspnea    on exertion  . Fibromyalgia   . Headache   . Heart murmur    told back in 2007, but no mention with 2016 cardiac w/u  . Hypertension   . Lupus (systemic lupus erythematosus) ([place])   . Mitral regurgitation    a. 05/2014 echo: EF >55%, nl LV/RV sys fxn, mild MR/AR, no valvular stenosis   . Obesity (BMI 30-39.9)   . Raynaud's phenomenon   . Rheumatoid arthritis ([place])   . Sjogren's disease ([place])   . Spondylolisthesis of lumbar region   . Tobacco abuse    a. ongoing; b. ongoing SOB    Family History  Problem Relation Age of Onset  . Hypertension Mother   . Valvular heart disease Mother   . CAD Father 104       CABG x 4  . Liver cancer Father 59       passed  . CAD Paternal Aunt        CABG  . Breast cancer Paternal Aunt   . CAD Paternal Aunt        CABG  . Breast cancer Paternal Aunt   . CAD Paternal 62  CABG  . Breast cancer Paternal Aunt   . CAD Paternal Aunt        CABG  . Breast cancer Paternal Aunt   . CAD Paternal Aunt        CABG  . Breast cancer Paternal Aunt   . CAD Paternal Uncle        MI s/p CABG  . Stroke Maternal Grandmother   . Stroke Maternal Grandfather   . CAD Maternal Grandfather    Past Surgical History:  Procedure Laterality Date  . ABDOMINAL HYSTERECTOMY    . BACK SURGERY    . CESAREAN SECTION     x3  . LUMBAR LAMINECTOMY    . LUMBAR LAMINECTOMY/DECOMPRESSION MICRODISCECTOMY N/A 03/29/2017   Procedure: Re operative Right Lumbar Three-Four Laminectomy and discectomy with Left Lumbar  Three-Four, Bilateral Lumbar Four-Five Laminectomy for decompression;  Surgeon: Kevan Ny Ditty, MD;  Location: [facility];  Service: Neurosurgery;  Laterality: N/A;  . SPINAL CORD STIMULATOR INSERTION  12/09/2017   Procedure: LUMBAR SPINAL CORD STIMULATOR IMPLANT;  Surgeon: Eustace Moore, MD;  Location: [facility];  Service: Neurosurgery;;  . SPINAL CORD STIMULATOR REMOVAL N/A 01/11/2018   Procedure: LUMBAR SPINAL CORD STIMULATOR REMOVAL;  Surgeon: Eustace Moore, MD;  Location: [facility];  Service: Neurosurgery;  Laterality: N/A;  . TUBAL LIGATION     Social History   Social History Narrative   Married.   3 children.   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    Immunization History  Administered Date(s) Administered  . Influenza,inj,Quad PF,6+ Mos 10/19/2016, 10/21/2017, 01/31/2019  . Pneumococcal Polysaccharide-23 10/27/2014, 06/25/2019  . Td 04/19/2016  . Zoster 10/27/2014     Objective: Vital Signs: There were no vitals taken for this visit.   Physical Exam   Musculoskeletal Exam: ***  CDAI Exam: CDAI Score: -- Patient Global: --; Provider Global: -- Swollen: --; Tender: -- Joint Exam 01/23/2020   No joint exam has been documented for this visit   There is currently no information documented on the homunculus. Go to the Rheumatology activity and complete the homunculus joint exam.  Investigation: No additional findings.  Imaging: No results found.  Recent Labs: Lab Results  Component Value Date   WBC 6.4 07/05/2019   HGB 13.1 07/05/2019   PLT 302 07/05/2019   NA 139 07/05/2019   K 4.3 07/05/2019   CL 104 07/05/2019   CO2 28 07/05/2019   GLUCOSE 233 (H) 07/05/2019   BUN 10 07/05/2019   CREATININE 0.83 07/05/2019   BILITOT 0.4 07/05/2019   ALKPHOS 101 03/02/2019   AST 15 07/05/2019   ALT 11 07/05/2019   PROT 7.2 07/05/2019   ALBUMIN 4.4 03/02/2019   CALCIUM 9.4 07/05/2019   GFRAA 92 07/05/2019    Speciality Comments:  PLQ Eye Exam: 01/16/2019 WNL @ [facility]  Procedures:  No procedures performed Allergies: Pseudoephedrine hcl, Ancef [cefazolin], Clindamycin/lincomycin, Prednisone, Adhesive [tape], Other, and Sudafed [pseudoephedrine hcl]   Assessment / Plan:     Visit Diagnoses: No diagnosis found.  Orders: No orders of the defined types were placed in this encounter.  No orders of the defined types were placed in this encounter.   Face-to-face time spent with patient was *** minutes. Greater than 50% of time was spent in counseling and coordination of care.  Follow-Up Instructions: No follow-ups on file.   Ofilia Neas, PA-C  Note - This record has been created using Dragon software.  Chart creation errors have  been sought, but may not always  have been located. Such creation errors do not reflect on  the standard of medical care.

## 2020-01-23 NOTE — Progress Notes (Signed)
Office Visit Note  Patient: Lauren Winters             Date of Birth: 1966-08-16           MRN: [id number]             PCP: Pleas Koch, NP Referring: Pleas Koch, NP Visit Date: 01/28/2020 Occupation: _0 @  Subjective:  Pain in joints.   History of Present Illness: KESLEE HARRINGTON is a 54 y.o. female with history of systemic lupus, osteoarthritis, fibromyalgia and Sjogren's.  She continues to have some problems with dry mouth and dry eyes.  She states she continues to have recurrent oral ulcers.  She has been experiencing discomfort in her left trochanteric bursa.  She had bilateral knee joint viscosupplementation junctions in 2018 and the discomfort is coming back.  She would like to have repeat Visco supplement injections.  She gives history of occasional swelling in her right second MCP joint.  None of the other joints are swollen.  Activities of Daily Living:  Patient reports morning stiffness for several hours.   Patient Reports nocturnal pain.  Difficulty dressing/grooming: Reports Difficulty climbing stairs: Reports Difficulty getting out of chair: Reports Difficulty using hands for taps, buttons, cutlery, and/or writing: Reports  Review of Systems  Constitutional: Positive for fatigue. Negative for night sweats, weight gain and weight loss.  HENT: Positive for mouth sores and mouth dryness. Negative for trouble swallowing, trouble swallowing and nose dryness.        Nose sores  Eyes: Positive for dryness. Negative for pain, redness and visual disturbance.  Respiratory: Negative for cough, shortness of breath and difficulty breathing.   Cardiovascular: Negative for chest pain, palpitations, hypertension, irregular heartbeat and swelling in legs/feet.  Gastrointestinal: Negative for blood in stool, constipation and diarrhea.  Endocrine: Negative for increased urination.  Genitourinary: Positive for difficulty urinating. Negative for painful urination  and vaginal dryness.  Musculoskeletal: Positive for arthralgias, joint pain, joint swelling and morning stiffness. Negative for myalgias, muscle weakness, muscle tenderness and myalgias.  Skin: Positive for hair loss. Negative for color change, rash, skin tightness, ulcers and sensitivity to sunlight.  Allergic/Immunologic: Negative for susceptible to infections.  Neurological: Positive for headaches. Negative for dizziness, numbness, memory loss, night sweats and weakness.  Hematological: Negative for bruising/bleeding tendency and swollen glands.  Psychiatric/Behavioral: Positive for sleep disturbance. Negative for depressed mood and confusion. The patient is not nervous/anxious.     PMFS History:  Patient Active Problem List   Diagnosis Date Noted  . Paresthesias/numbness 03/04/2019  . Paresthesias 03/02/2019  . CVA (cerebral vascular accident) ([place]) 08/19/2018  . S/P lumbar spinal fusion 05/26/2018  . High risk medication use 02/03/2018  . Wound infection after surgery 01/11/2018  . S/P lumbar laminectomy 12/09/2017  . Paresthesia 05/11/2017  . HNP (herniated nucleus pulposus), lumbar 03/29/2017  . Chronic right shoulder pain 02/18/2017  . Primary osteoarthritis of both hands 12/22/2016  . Primary osteoarthritis of both knees 12/22/2016  . Spondylosis of lumbar region without myelopathy or radiculopathy 12/22/2016  . Fibromyalgia 12/22/2016  . Preventative health care 04/19/2016  . Chronic back pain 12/17/2015  . Hyperlipidemia 08/29/2015  . Other organ or system involvement in systemic lupus erythematosus ([place]) 08/04/2015  . Rheumatoid factor positive 08/04/2015  . Sjogren's syndrome ([place]) 08/04/2015  . Mitral regurgitation   . Tobacco abuse   . Atypical chest pain   . Obesity (BMI 30-39.9)   . Anxiety   . Hypertension   . Diabetes  mellitus without complication ([place])   . Clinical depression 05/23/2014    Past Medical History:  Diagnosis Date  . Anxiety   . Atypical  chest pain    a. ETT 05/2014: normal stress test without evidence of myocardial ischemia or chest pain at peak stress  . Complication of anesthesia    migraine headache  . Depression   . Diabetes mellitus without complication ([place]) metformin   dx 2015  . Dyspnea    on exertion  . Fibromyalgia   . Headache   . Heart murmur    told back in 2007, but no mention with 2016 cardiac w/u  . Hypertension   . Lupus (systemic lupus erythematosus) ([place])   . Mitral regurgitation    a. 05/2014 echo: EF >55%, nl LV/RV sys fxn, mild MR/AR, no valvular stenosis   . Obesity (BMI 30-39.9)   . Raynaud's phenomenon   . Rheumatoid arthritis ([place])   . Sjogren's disease ([place])   . Spondylolisthesis of lumbar region   . Tobacco abuse    a. ongoing; b. ongoing SOB    Family History  Problem Relation Age of Onset  . Hypertension Mother   . Valvular heart disease Mother   . CAD Father 44       CABG x 4  . Liver cancer Father 68       passed  . CAD Paternal Aunt        CABG  . Breast cancer Paternal Aunt   . CAD Paternal Aunt        CABG  . Breast cancer Paternal Aunt   . CAD Paternal Aunt        CABG  . Breast cancer Paternal Aunt   . CAD Paternal Aunt        CABG  . Breast cancer Paternal Aunt   . CAD Paternal Aunt        CABG  . Breast cancer Paternal Aunt   . CAD Paternal Uncle        MI s/p CABG  . Stroke Maternal Grandmother   . Stroke Maternal Grandfather   . CAD Maternal Grandfather    Past Surgical History:  Procedure Laterality Date  . ABDOMINAL HYSTERECTOMY    . BACK SURGERY    . BONE MARROW BIOPSY  10/2019  . CESAREAN SECTION     x3  . LUMBAR LAMINECTOMY    . LUMBAR LAMINECTOMY/DECOMPRESSION MICRODISCECTOMY N/A 03/29/2017   Procedure: Re operative Right Lumbar Three-Four Laminectomy and discectomy with Left Lumbar Three-Four, Bilateral Lumbar Four-Five Laminectomy for decompression;  Surgeon: Kevan Ny Ditty, MD;  Location: [facility];  Service: Neurosurgery;  Laterality:  N/A;  . SPINAL CORD STIMULATOR INSERTION  12/09/2017   Procedure: LUMBAR SPINAL CORD STIMULATOR IMPLANT;  Surgeon: Eustace Moore, MD;  Location: [facility];  Service: Neurosurgery;;  . SPINAL CORD STIMULATOR REMOVAL N/A 01/11/2018   Procedure: LUMBAR SPINAL CORD STIMULATOR REMOVAL;  Surgeon: Eustace Moore, MD;  Location: [facility];  Service: Neurosurgery;  Laterality: N/A;  . TUBAL LIGATION     Social History   Social History Narrative   Married.   3 children.   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    Immunization History  Administered Date(s) Administered  . Influenza,inj,Quad PF,6+ Mos 10/19/2016, 10/21/2017, 01/31/2019  . Pneumococcal Polysaccharide-23 10/27/2014, 06/25/2019  . Td 04/19/2016  . Zoster 10/27/2014     Objective: Vital Signs: BP (!) 154/93 (BP Location: Left Arm, Patient Position: Sitting,  Cuff Size: Normal)   Pulse (!) 101   Resp 14   Ht _0  (1.676 m)   Wt 215 lb 9.6 oz (97.8 kg)   BMI 34.80 kg/m    Physical Exam Vitals and nursing note reviewed.  Constitutional:      Appearance: She is well-developed.  HENT:     Head: Normocephalic and atraumatic.  Eyes:     Conjunctiva/sclera: Conjunctivae normal.  Cardiovascular:     Rate and Rhythm: Normal rate and regular rhythm.     Heart sounds: Normal heart sounds.  Pulmonary:     Effort: Pulmonary effort is normal.     Breath sounds: Normal breath sounds.  Abdominal:     General: Bowel sounds are normal.     Palpations: Abdomen is soft.  Musculoskeletal:     Cervical back: Normal range of motion.  Lymphadenopathy:     Cervical: No cervical adenopathy.  Skin:    General: Skin is warm and dry.     Capillary Refill: Capillary refill takes less than 2 seconds.  Neurological:     Mental Status: She is alert and oriented to person, place, and time.  Psychiatric:        Behavior: Behavior normal.      Musculoskeletal Exam: C-spine was in good range of motion.  She has  discomfort with raising arms more than 90 degrees bilaterally.  Elbow joints wrist joint MCPs PIPs DIPs with good range of motion with no synovitis.  She has limited range of motion of bilateral hip joints.  Knee joints are in good range of motion.  Ankle joints and MTPs with good range of motion with no synovitis.  She has generalized hyperalgesia and positive tender points.  CDAI Exam: CDAI Score: -- Patient Global: --; Provider Global: -- Swollen: --; Tender: -- Joint Exam 01/28/2020   No joint exam has been documented for this visit   There is currently no information documented on the homunculus. Go to the Rheumatology activity and complete the homunculus joint exam.  Investigation: No additional findings.  Imaging: No results found.  Recent Labs: Lab Results  Component Value Date   WBC 6.4 07/05/2019   HGB 13.1 07/05/2019   PLT 302 07/05/2019   NA 139 07/05/2019   K 4.3 07/05/2019   CL 104 07/05/2019   CO2 28 07/05/2019   GLUCOSE 233 (H) 07/05/2019   BUN 10 07/05/2019   CREATININE 0.83 07/05/2019   BILITOT 0.4 07/05/2019   ALKPHOS 101 03/02/2019   AST 15 07/05/2019   ALT 11 07/05/2019   PROT 7.2 07/05/2019   ALBUMIN 4.4 03/02/2019   CALCIUM 9.4 07/05/2019   GFRAA 92 07/05/2019    Speciality Comments: PLQ Eye Exam: 01/16/2019 WNL @ [facility]  Procedures:  No procedures performed Allergies: Pseudoephedrine hcl, Ancef [cefazolin], Clindamycin/lincomycin, Prednisone, Adhesive [tape], Other, and Sudafed [pseudoephedrine hcl]   Assessment / Plan:     Visit Diagnoses: Other organ or system involvement in systemic lupus erythematosus ([place]) - +La, oral ulcers, malar rash, fatigue, photosensitivity, Raynaud's, Subcutaneous lupus-bx+, hair loss, +RF, sicca symptoms  -patient states that she still has infrequent oral ulcers.  She also gives history of dry mouth and dry eyes.  She gives history of intermittent swelling in her right second MCP joint.  No synovitis was noted  on examination.  No oral ulcers noted.  I will obtain lab work.  Plan: Urinalysis, Routine w reflex microscopic, Anti-DNA antibody, double-stranded, C3 and C4, Sedimentation rate, ANA  High risk  medication use - MTX 8 tabs po once weekly, folic acid 2 mg po daily, PLQ 200 mg 1 tablet BID. -Her labs are past due.  She will come in tomorrow for the blood work.  She is supposed to get labs every 3 months while she is on methotrexate.  The risk of toxicity and monitoring during the labs was emphasized.  Plan: CBC with Differential/Platelet, COMPLETE METABOLIC PANEL WITH GFR  Sjogren's syndrome with other organ involvement (HCC)-over-the-counter products were discussed.  Recurrent oral ulcers-she continues to have recurrent oral ulcers.  Raynaud's syndrome without gangrene-warm clothing was discussed.  Trochanteric bursitis, left hip -chest exercises were discussed.  Primary osteoarthritis of both hands-joint protection was discussed.  Primary osteoarthritis of both knees -she has had good response to Visco supplement injections in the past.  She states her knee joints are hurting again.  She would like to schedule repeat Visco supplement injections.  Plan: XR KNEE 3 VIEW RIGHT, XR KNEE 3 VIEW LEFT.  The x-ray findings were consistent with bilateral moderate osteoarthritis and moderate chondromalacia patella.  We will schedule viscosupplementation junctions after prior authorization.  S/P lumbar laminectomy-chronic pain.  Vitamin D deficiency -patient has not been taking vitamin D.  She probably will need another loading dose and will need maintenance therapy.  Plan: VITAMIN D 25 Hydroxy (Vit-D Deficiency, Fractures)  Fibromyalgia-she continues to have some generalized pain and discomfort.  Other medical problems are listed as follows:  Diabetes mellitus without complication ([place])  Essential hypertension  Mitral valve insufficiency, unspecified etiology  History of chronic kidney  disease  Former smoker  Orders: Orders Placed This Encounter  Procedures  . XR KNEE 3 VIEW RIGHT  . XR KNEE 3 VIEW LEFT  . CBC with Differential/Platelet  . COMPLETE METABOLIC PANEL WITH GFR  . Urinalysis, Routine w reflex microscopic  . Anti-DNA antibody, double-stranded  . C3 and C4  . Sedimentation rate  . VITAMIN D 25 Hydroxy (Vit-D Deficiency, Fractures)  . ANA   No orders of the defined types were placed in this encounter.   Face-to-face time spent with patient was 30 minutes. Greater than 50% of time was spent in counseling and coordination of care.  Follow-Up Instructions: Return for Systemic lupus, Sjogren's, Osteoarthritis.   Bo Merino, MD  Note - This record has been created using Editor, commissioning.  Chart creation errors have been sought, but may not always  have been located. Such creation errors do not reflect on  the standard of medical care.

## 2020-01-28 ENCOUNTER — Ambulatory Visit: Payer: Self-pay

## 2020-01-28 ENCOUNTER — Encounter: Payer: Self-pay | Admitting: Rheumatology

## 2020-01-28 ENCOUNTER — Ambulatory Visit (INDEPENDENT_AMBULATORY_CARE_PROVIDER_SITE_OTHER): Payer: Medicare Other | Admitting: Rheumatology

## 2020-01-28 ENCOUNTER — Other Ambulatory Visit: Payer: Self-pay

## 2020-01-28 ENCOUNTER — Ambulatory Visit (INDEPENDENT_AMBULATORY_CARE_PROVIDER_SITE_OTHER): Payer: Medicare Other

## 2020-01-28 ENCOUNTER — Telehealth: Payer: Self-pay | Admitting: *Deleted

## 2020-01-28 VITALS — BP 154/93 | HR 101 | Resp 14 | Ht 66.0 in | Wt 215.6 lb

## 2020-01-28 DIAGNOSIS — M19041 Primary osteoarthritis, right hand: Secondary | ICD-10-CM

## 2020-01-28 DIAGNOSIS — M7062 Trochanteric bursitis, left hip: Secondary | ICD-10-CM

## 2020-01-28 DIAGNOSIS — K1379 Other lesions of oral mucosa: Secondary | ICD-10-CM

## 2020-01-28 DIAGNOSIS — M170 Bilateral primary osteoarthritis of knee: Secondary | ICD-10-CM

## 2020-01-28 DIAGNOSIS — M3219 Other organ or system involvement in systemic lupus erythematosus: Secondary | ICD-10-CM | POA: Diagnosis not present

## 2020-01-28 DIAGNOSIS — Z87891 Personal history of nicotine dependence: Secondary | ICD-10-CM

## 2020-01-28 DIAGNOSIS — I10 Essential (primary) hypertension: Secondary | ICD-10-CM

## 2020-01-28 DIAGNOSIS — M3509 Sicca syndrome with other organ involvement: Secondary | ICD-10-CM

## 2020-01-28 DIAGNOSIS — I340 Nonrheumatic mitral (valve) insufficiency: Secondary | ICD-10-CM

## 2020-01-28 DIAGNOSIS — I7300 Raynaud's syndrome without gangrene: Secondary | ICD-10-CM

## 2020-01-28 DIAGNOSIS — Z79899 Other long term (current) drug therapy: Secondary | ICD-10-CM | POA: Diagnosis not present

## 2020-01-28 DIAGNOSIS — M797 Fibromyalgia: Secondary | ICD-10-CM

## 2020-01-28 DIAGNOSIS — M19042 Primary osteoarthritis, left hand: Secondary | ICD-10-CM

## 2020-01-28 DIAGNOSIS — E559 Vitamin D deficiency, unspecified: Secondary | ICD-10-CM

## 2020-01-28 DIAGNOSIS — E119 Type 2 diabetes mellitus without complications: Secondary | ICD-10-CM

## 2020-01-28 DIAGNOSIS — Z9889 Other specified postprocedural states: Secondary | ICD-10-CM

## 2020-01-28 DIAGNOSIS — Z87448 Personal history of other diseases of urinary system: Secondary | ICD-10-CM

## 2020-01-28 NOTE — Patient Instructions (Signed)
Standing Labs We placed an order today for your standing lab work.    Please come back and get your standing labs tomorrow and then every 3 months We have open lab daily Monday through Thursday from 8:30-12:30 PM and 1:30-4:30 PM and Friday from 8:30-12:30 PM and 1:30-4:00 PM at the office of Dr. Bo Merino.   You may experience shorter wait times on Monday and Friday afternoons. The office is located at [address] [place], [place], [place] [address] No appointment is necessary.   Labs are drawn by [facility].  You may receive a bill from [facility] for your lab work.  If you wish to have your labs drawn at another location, please call the office 24 hours in advance to send orders.  If you have any questions regarding directions or hours of operation,  please call [phone].   Just as a reminder please drink plenty of water prior to coming for your lab work. Thanks!

## 2020-01-28 NOTE — Telephone Encounter (Signed)
Please apply for bilateral visco. Thank you. °

## 2020-01-29 NOTE — Telephone Encounter (Signed)
Please schedule for Visco knee injections.  Call, and schedule Synvisc One Bilateral Knees. Insurance to cover 80%, patient responsible for 20%. No PA required. No Copay. Please include patient to get Bil knee x-rays at this appointment.

## 2020-01-31 ENCOUNTER — Other Ambulatory Visit: Payer: Self-pay

## 2020-01-31 ENCOUNTER — Other Ambulatory Visit: Payer: Self-pay | Admitting: Pharmacist

## 2020-01-31 DIAGNOSIS — E559 Vitamin D deficiency, unspecified: Secondary | ICD-10-CM

## 2020-01-31 DIAGNOSIS — M3219 Other organ or system involvement in systemic lupus erythematosus: Secondary | ICD-10-CM

## 2020-01-31 DIAGNOSIS — M3509 Sicca syndrome with other organ involvement: Secondary | ICD-10-CM

## 2020-01-31 NOTE — Progress Notes (Signed)
Patient presented for labs.  Prior orders no longer active.  Replaced orders.   Mariella Saa, PharmD, [place], [place] Clinical Specialty Pharmacist [phone]  01/31/2020 1:44 PM

## 2020-02-04 ENCOUNTER — Telehealth: Payer: Self-pay | Admitting: Rheumatology

## 2020-02-04 NOTE — Progress Notes (Signed)
I received a phone call on Saturday from the answering service regarding elevated glucose of 464.  I tried calling patient but did not get a response from her.  Please try to reach patient to discuss her glucose levels.  All the results are not back yet.  We will contact her once all the results are available.

## 2020-02-04 NOTE — Telephone Encounter (Signed)
Patient left a voicemail stating she was returning a call to the office.  Patient states she will contact her PCP today regarding her Glucose levels and see if she wants to increase her insulin.  Patient states she also noticed that her SED rate and RBC was off and is asking if this is something she should be concerned about.

## 2020-02-04 NOTE — Telephone Encounter (Signed)
RBC is not a concern. ESR is mildly elevated. Other labs do not indicate a lupus flare. We will monitor for now.

## 2020-02-05 LAB — COMPLETE METABOLIC PANEL WITH GFR
AG Ratio: 1.1 (calc) (ref 1.0–2.5)
ALT: 13 U/L (ref 6–29)
AST: 16 U/L (ref 10–35)
Albumin: 4.3 g/dL (ref 3.6–5.1)
Alkaline phosphatase (APISO): 116 U/L (ref 37–153)
BUN: 13 mg/dL (ref 7–25)
CO2: 28 mmol/L (ref 20–32)
Calcium: 9.5 mg/dL (ref 8.6–10.4)
Chloride: 92 mmol/L — ABNORMAL LOW (ref 98–110)
Creat: 0.89 mg/dL (ref 0.50–1.05)
GFR, Est African American: 85 mL/min/{1.73_m2} (ref >=60)
GFR, Est Non African American: 73 mL/min/{1.73_m2} (ref >=60)
Globulin: 3.8 g/dL (calc) — ABNORMAL HIGH (ref 1.9–3.7)
Glucose, Bld: 464 mg/dL — ABNORMAL HIGH (ref 65–99)
Potassium: 4.2 mmol/L (ref 3.5–5.3)
Sodium: 131 mmol/L — ABNORMAL LOW (ref 135–146)
Total Bilirubin: 0.6 mg/dL (ref 0.2–1.2)
Total Protein: 8.1 g/dL (ref 6.1–8.1)

## 2020-02-05 LAB — CBC WITH DIFFERENTIAL/PLATELET
Absolute Monocytes: 453 cells/uL (ref 200–950)
Basophils Absolute: 80 cells/uL (ref 0–200)
Basophils Relative: 1.1 %
Eosinophils Absolute: 117 cells/uL (ref 15–500)
Eosinophils Relative: 1.6 %
HCT: 45 % (ref 35.0–45.0)
Hemoglobin: 14.9 g/dL (ref 11.7–15.5)
Lymphs Abs: 2205 cells/uL (ref 850–3900)
MCH: 28.1 pg (ref 27.0–33.0)
MCHC: 33.1 g/dL (ref 32.0–36.0)
MCV: 84.9 fL (ref 80.0–100.0)
MPV: 9.8 fL (ref 7.5–12.5)
Monocytes Relative: 6.2 %
Neutro Abs: 4446 cells/uL (ref 1500–7800)
Neutrophils Relative %: 60.9 %
Platelets: 423 10*3/uL — ABNORMAL HIGH (ref 140–400)
RBC: 5.3 10*6/uL — ABNORMAL HIGH (ref 3.80–5.10)
RDW: 12.2 % (ref 11.0–15.0)
Total Lymphocyte: 30.2 %
WBC: 7.3 10*3/uL (ref 3.8–10.8)

## 2020-02-05 LAB — URINALYSIS, ROUTINE W REFLEX MICROSCOPIC
Bilirubin Urine: NEGATIVE
Hgb urine dipstick: NEGATIVE
Ketones, ur: NEGATIVE
Leukocytes,Ua: NEGATIVE
Nitrite: NEGATIVE
Protein, ur: NEGATIVE
Specific Gravity, Urine: 1.033 (ref 1.001–1.03)
pH: <=5 (ref 5.0–8.0)

## 2020-02-05 LAB — PROTEIN / CREATININE RATIO, URINE
Creatinine, Urine: 44 mg/dL (ref 20–275)
Total Protein, Urine: <4 mg/dL — ABNORMAL LOW (ref 5–24)

## 2020-02-05 LAB — C3 AND C4
C3 Complement: 182 mg/dL (ref 83–193)
C4 Complement: 26 mg/dL (ref 15–57)

## 2020-02-05 LAB — VITAMIN D 1,25 DIHYDROXY
Vitamin D 1, 25 (OH)2 Total: 44 pg/mL (ref 18–72)
Vitamin D2 1, 25 (OH)2: <8 pg/mL
Vitamin D3 1, 25 (OH)2: 44 pg/mL

## 2020-02-05 LAB — ANTI-DNA ANTIBODY, DOUBLE-STRANDED: ds DNA Ab: 1 IU/mL

## 2020-02-05 LAB — ANTI-NUCLEAR AB-TITER (ANA TITER): ANA Titer 1: 1:80 {titer} — ABNORMAL HIGH

## 2020-02-05 LAB — SEDIMENTATION RATE: Sed Rate: 34 mm/h — ABNORMAL HIGH (ref 0–30)

## 2020-02-05 LAB — ANA: Anti Nuclear Antibody (ANA): POSITIVE — AB

## 2020-02-05 NOTE — Telephone Encounter (Signed)
Attempted to contact the patient and left message for patient to call the office.  

## 2020-02-05 NOTE — Telephone Encounter (Signed)
Patient advised RBC is not a concern. ESR is mildly elevated. Other labs do not indicate a lupus flare. We will monitor for now.

## 2020-03-03 ENCOUNTER — Other Ambulatory Visit: Payer: Self-pay

## 2020-03-03 ENCOUNTER — Ambulatory Visit (INDEPENDENT_AMBULATORY_CARE_PROVIDER_SITE_OTHER): Payer: Medicare Other | Admitting: Physician Assistant

## 2020-03-03 DIAGNOSIS — M170 Bilateral primary osteoarthritis of knee: Secondary | ICD-10-CM | POA: Diagnosis not present

## 2020-03-03 DIAGNOSIS — G8929 Other chronic pain: Secondary | ICD-10-CM

## 2020-03-03 DIAGNOSIS — M25561 Pain in right knee: Secondary | ICD-10-CM

## 2020-03-03 DIAGNOSIS — M25562 Pain in left knee: Secondary | ICD-10-CM

## 2020-03-03 MED ORDER — LIDOCAINE HCL 1 % IJ SOLN
1.5000 mL | INTRAMUSCULAR | Status: AC | PRN
  Administered 2020-03-03: 1.5 mL

## 2020-03-03 MED ORDER — HYLAN G-F 20 48 MG/6ML IX SOSY
48.0000 mg | PREFILLED_SYRINGE | INTRA_ARTICULAR | Status: AC | PRN
  Administered 2020-03-03: 48 mg via INTRA_ARTICULAR

## 2020-03-03 NOTE — Progress Notes (Signed)
   Procedure Note  Patient: Lauren Winters             Date of Birth: November 17, 1966           MRN: [id number]             Visit Date: 03/03/2020  Procedures: Visit Diagnoses:  1. Primary osteoarthritis of both knees   2. Chronic pain of both knees     synvisc ONE, bilateral knees, B/B Large Joint Inj: bilateral knee on 03/03/2020 11:54 AM Indications: pain Details: 22 G 1.5 in needle, medial approach  Arthrogram: No  Medications (Right): 1.5 mL lidocaine 1 %; 48 mg Hylan 48 MG/6ML Aspirate (Right): 0 mL Medications (Left): 1.5 mL lidocaine 1 %; 48 mg Hylan 48 MG/6ML Aspirate (Left): 0 mL Outcome: tolerated well, no immediate complications Procedure, treatment alternatives, risks and benefits explained, specific risks discussed. Consent was given by the patient. Immediately prior to procedure a time out was called to verify the correct patient, procedure, equipment, support staff and site/side marked as required. Patient was prepped and draped in the usual sterile fashion.     Patient tolerated the procedure well.  Aftercare was discussed.   Hazel Sams, PA-C

## 2020-04-11 DIAGNOSIS — M329 Systemic lupus erythematosus, unspecified: Secondary | ICD-10-CM | POA: Diagnosis not present

## 2020-04-11 DIAGNOSIS — E1129 Type 2 diabetes mellitus with other diabetic kidney complication: Secondary | ICD-10-CM | POA: Diagnosis not present

## 2020-04-11 DIAGNOSIS — N182 Chronic kidney disease, stage 2 (mild): Secondary | ICD-10-CM | POA: Diagnosis not present

## 2020-04-11 DIAGNOSIS — R809 Proteinuria, unspecified: Secondary | ICD-10-CM | POA: Diagnosis not present

## 2020-04-11 DIAGNOSIS — I10 Essential (primary) hypertension: Secondary | ICD-10-CM | POA: Diagnosis not present

## 2020-04-11 DIAGNOSIS — D472 Monoclonal gammopathy: Secondary | ICD-10-CM | POA: Diagnosis not present

## 2020-04-17 ENCOUNTER — Other Ambulatory Visit: Payer: Self-pay

## 2020-04-17 ENCOUNTER — Other Ambulatory Visit: Payer: Self-pay | Admitting: Primary Care

## 2020-04-17 ENCOUNTER — Other Ambulatory Visit: Payer: Self-pay | Admitting: Rheumatology

## 2020-04-17 DIAGNOSIS — E119 Type 2 diabetes mellitus without complications: Secondary | ICD-10-CM

## 2020-04-17 DIAGNOSIS — G8929 Other chronic pain: Secondary | ICD-10-CM

## 2020-04-17 DIAGNOSIS — F419 Anxiety disorder, unspecified: Secondary | ICD-10-CM

## 2020-04-17 DIAGNOSIS — I639 Cerebral infarction, unspecified: Secondary | ICD-10-CM

## 2020-04-17 DIAGNOSIS — M545 Low back pain, unspecified: Secondary | ICD-10-CM

## 2020-04-17 MED ORDER — CLOPIDOGREL BISULFATE 75 MG PO TABS: 75.0000 mg | ORAL_TABLET | Freq: Every day | ORAL | 0 refills | Status: DC

## 2020-04-17 MED ORDER — ESCITALOPRAM OXALATE 20 MG PO TABS: 20.0000 mg | ORAL_TABLET | Freq: Every day | ORAL | 0 refills | Status: DC

## 2020-04-17 MED ORDER — INSULIN GLARGINE 100 UNIT/ML SOLOSTAR PEN: 22.0000 [IU] | PEN_INJECTOR | Freq: Every day | SUBCUTANEOUS | 0 refills | Status: DC

## 2020-04-17 NOTE — Telephone Encounter (Signed)
Last appointment on 06/25/2019. Next future appointment on 05/14/2020. FYI, patient have been canceling appointments since last seen.

## 2020-04-17 NOTE — Telephone Encounter (Signed)
Noted, refill sent to pharmacy. 

## 2020-04-17 NOTE — Telephone Encounter (Signed)
Last Visit:01/28/2020 Next Visit: 04/28/2020 Labs: 02/01/2020 RBC 5.30, platelets 423, glucose 464, sodium 131, chloride 92, globulin 3.8 Eye exam: 01/16/2019   Attempted to contact patient and left message on machine to advise patient we need updated plaquenil eye exam.   Okay to refill plaquenil and methotrexate?

## 2020-04-17 NOTE — Telephone Encounter (Signed)
Ok to refill 30 day supply of PLQ. Ok to refill MTX.

## 2020-04-17 NOTE — Telephone Encounter (Signed)
Noted, MyChart message sent to patient regarding canceled appointments and the need to follow-up. Refill provided for cyclobenzaprine and Lantus.

## 2020-04-17 NOTE — Telephone Encounter (Signed)
Ok to refill? Last prescribed on 06/25/2019  . Last appointment on 06/25/2019. Next future appointment on 05/14/2020. FYI, patient have been canceling appointments since last seen.

## 2020-04-18 DIAGNOSIS — E119 Type 2 diabetes mellitus without complications: Secondary | ICD-10-CM

## 2020-04-18 MED ORDER — HYDROXYCHLOROQUINE SULFATE 200 MG PO TABS: 200.0000 mg | ORAL_TABLET | Freq: Two times a day (BID) | ORAL | 0 refills | Status: DC

## 2020-04-18 MED ORDER — PEN NEEDLES 31G X 6 MM MISC: 0 refills | Status: AC

## 2020-04-18 MED ORDER — METFORMIN HCL ER 500 MG PO TB24: ORAL_TABLET | ORAL | 0 refills | Status: DC

## 2020-04-18 MED ORDER — METHOTREXATE 2.5 MG PO TABS: 20.0000 mg | ORAL_TABLET | ORAL | 0 refills | Status: DC

## 2020-04-21 MED ORDER — FOLIC ACID 1 MG PO TABS: ORAL_TABLET | ORAL | 1 refills | Status: DC

## 2020-04-21 NOTE — Telephone Encounter (Signed)
Last Visit: 01/28/2020  Next Visit: 04/28/2020  Okay to refill per Dr. Estanislado Pandy.

## 2020-04-24 NOTE — Progress Notes (Signed)
Office Visit Note  Patient: Lauren Winters             Date of Birth: 1966-11-26           MRN: [id number]             PCP: Pleas Koch, NP Referring: Pleas Koch, NP Visit Date: 04/28/2020 Occupation: @GUAROCC @  Subjective:  Lower back pain   History of Present Illness: Lauren Winters is a 54 y.o. female with history of systemic lupus erythematosus, fibromyalgia, osteoarthritis, and DDD.  Patient is taking methotrexate 8 tablets by mouth once weekly, folic acid 2 mg by mouth daily, and Plaquenil 200 mg 1 tablet twice daily.  She continues to experience nausea with oral methotrexate and would like to switch to injectable methotrexate.  She denies any recent signs or symptoms of a lupus flare.  She continues to have intermittent oral and nasal ulcerations but does not have any currently.  She is chronic sicca symptoms due to Sjogren's syndrome.  She uses Biotene products as needed for symptomatic relief.  She is not using any eyedrops at this time.  She denies any enlarged lymph nodes.  She has not had any recent rashes, photosensitivity, or hair loss.  She continues to experience intermittent symptoms of Raynaud's.  She takes Norvasc 10 mg by mouth daily but has not noticed any improvement in her symptoms.  She denies any digital ulcerations.  She has been experiencing increased headaches and is unsure if it is related to her neck pain.  She continues to have chronic neck and lower back pain.  She will be establishing with a new neurosurgeon at [facility] in the upcoming weeks.  According to the patient her neurologist recommended seeing a pain management specialist but the referral was declined due to her previous surgeries and allergy to prednisone.  She has been taking Flexeril 10 mg 3 times daily as needed for muscle spasms.  Flexeril has been helping her sleep better at night.  She continues to have chronic fatigue which has been stable.  She has generalized muscle aches and muscle  tenderness due to fibromyalgia.  She states overall her pain level has been increasing over the past several months.  She states that she noticed significant improvement in the right knee joint after the SynviscOne injection on 03/03/20. She continues to have pain and stiffness in the left knee despite the synviscOne injection.     Activities of Daily Living:  Patient reports morning stiffness for 5 hours.   Patient Reports nocturnal pain.  Difficulty dressing/grooming: Reports Difficulty climbing stairs: Reports Difficulty getting out of chair: Reports Difficulty using hands for taps, buttons, cutlery, and/or writing: Reports  Review of Systems  Constitutional: Positive for fatigue.  HENT: Positive for mouth dryness. Negative for mouth sores and nose dryness.   Eyes: Positive for dryness. Negative for pain and visual disturbance.  Respiratory: Positive for shortness of breath. Negative for cough, hemoptysis and difficulty breathing.   Cardiovascular: Negative for chest pain, palpitations, hypertension and swelling in legs/feet.  Gastrointestinal: Negative for blood in stool, constipation and diarrhea.  Endocrine: Negative for increased urination.  Genitourinary: Negative for difficulty urinating and painful urination.  Musculoskeletal: Positive for arthralgias, gait problem, joint pain, muscle weakness and morning stiffness. Negative for joint swelling, myalgias, muscle tenderness and myalgias.  Skin: Negative for color change, pallor, rash, hair loss, nodules/bumps, skin tightness, ulcers and sensitivity to sunlight.  Allergic/Immunologic: Negative for susceptible to infections.  Neurological: Negative  for dizziness and headaches.  Hematological: Negative for bruising/bleeding tendency and swollen glands.  Psychiatric/Behavioral: Positive for sleep disturbance. Negative for depressed mood. The patient is not nervous/anxious.     PMFS History:  Patient Active Problem List   Diagnosis  Date Noted  . Paresthesias/numbness 03/04/2019  . Paresthesias 03/02/2019  . CVA (cerebral vascular accident) ([place]) 08/19/2018  . S/P lumbar spinal fusion 05/26/2018  . High risk medication use 02/03/2018  . Wound infection after surgery 01/11/2018  . S/P lumbar laminectomy 12/09/2017  . Paresthesia 05/11/2017  . HNP (herniated nucleus pulposus), lumbar 03/29/2017  . Chronic right shoulder pain 02/18/2017  . Primary osteoarthritis of both hands 12/22/2016  . Primary osteoarthritis of both knees 12/22/2016  . Spondylosis of lumbar region without myelopathy or radiculopathy 12/22/2016  . Fibromyalgia 12/22/2016  . Preventative health care 04/19/2016  . Chronic back pain 12/17/2015  . Hyperlipidemia 08/29/2015  . Other organ or system involvement in systemic lupus erythematosus ([place]) 08/04/2015  . Rheumatoid factor positive 08/04/2015  . Sjogren's syndrome ([place]) 08/04/2015  . Mitral regurgitation   . Tobacco abuse   . Atypical chest pain   . Obesity (BMI 30-39.9)   . Anxiety   . Hypertension   . Diabetes mellitus without complication ([place])   . Clinical depression 05/23/2014    Past Medical History:  Diagnosis Date  . Anxiety   . Atypical chest pain    a. ETT 05/2014: normal stress test without evidence of myocardial ischemia or chest pain at peak stress  . Complication of anesthesia    migraine headache  . Depression   . Diabetes mellitus without complication ([place]) metformin   dx 2015  . Dyspnea    on exertion  . Fibromyalgia   . Headache   . Heart murmur    told back in 2007, but no mention with 2016 cardiac w/u  . Hypertension   . Lupus (systemic lupus erythematosus) ([place])   . Mitral regurgitation    a. 05/2014 echo: EF >55%, nl LV/RV sys fxn, mild MR/AR, no valvular stenosis   . Obesity (BMI 30-39.9)   . Raynaud's phenomenon   . Rheumatoid arthritis ([place])   . Sjogren's disease ([place])   . Spondylolisthesis of lumbar region   . Tobacco abuse    a. ongoing; b.  ongoing SOB    Family History  Problem Relation Age of Onset  . Hypertension Mother   . Valvular heart disease Mother   . CAD Father 66       CABG x 4  . Liver cancer Father 16       passed  . CAD Paternal Aunt        CABG  . Breast cancer Paternal Aunt   . CAD Paternal Aunt        CABG  . Breast cancer Paternal Aunt   . CAD Paternal Aunt        CABG  . Breast cancer Paternal Aunt   . CAD Paternal Aunt        CABG  . Breast cancer Paternal Aunt   . CAD Paternal Aunt        CABG  . Breast cancer Paternal Aunt   . CAD Paternal Uncle        MI s/p CABG  . Stroke Maternal Grandmother   . Stroke Maternal Grandfather   . CAD Maternal Grandfather    Past Surgical History:  Procedure Laterality Date  . ABDOMINAL HYSTERECTOMY    . BACK SURGERY    .  BONE MARROW BIOPSY  10/2019  . CESAREAN SECTION     x3  . LUMBAR LAMINECTOMY    . LUMBAR LAMINECTOMY/DECOMPRESSION MICRODISCECTOMY N/A 03/29/2017   Procedure: Re operative Right Lumbar Three-Four Laminectomy and discectomy with Left Lumbar Three-Four, Bilateral Lumbar Four-Five Laminectomy for decompression;  Surgeon: Kevan Ny Ditty, MD;  Location: [place];  Service: Neurosurgery;  Laterality: N/A;  . SPINAL CORD STIMULATOR INSERTION  12/09/2017   Procedure: LUMBAR SPINAL CORD STIMULATOR IMPLANT;  Surgeon: Eustace Moore, MD;  Location: [place];  Service: Neurosurgery;;  . SPINAL CORD STIMULATOR REMOVAL N/A 01/11/2018   Procedure: LUMBAR SPINAL CORD STIMULATOR REMOVAL;  Surgeon: Eustace Moore, MD;  Location: [facility];  Service: Neurosurgery;  Laterality: N/A;  . TUBAL LIGATION     Social History   Social History Narrative   Married.   3 children.   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    Immunization History  Administered Date(s) Administered  . Influenza,inj,Quad PF,6+ Mos 10/19/2016, 10/21/2017, 01/31/2019  . Pneumococcal Polysaccharide-23 10/27/2014, 06/25/2019  . Td 04/19/2016  .  Zoster 10/27/2014     Objective: Vital Signs: BP (!) 149/74 (BP Location: Left Arm, Patient Position: Sitting, Cuff Size: Normal)   Pulse 81   Resp 16   Ht 5' 6"  (1.676 m)   Wt 211 lb 3.2 oz (95.8 kg)   BMI 34.09 kg/m    Physical Exam Vitals and nursing note reviewed.  Constitutional:      Appearance: She is well-developed.  HENT:     Head: Normocephalic and atraumatic.  Eyes:     Conjunctiva/sclera: Conjunctivae normal.  Pulmonary:     Effort: Pulmonary effort is normal.  Abdominal:     General: Bowel sounds are normal.     Palpations: Abdomen is soft.  Musculoskeletal:     Cervical back: Normal range of motion.  Lymphadenopathy:     Cervical: No cervical adenopathy.  Skin:    General: Skin is warm and dry.     Capillary Refill: Capillary refill takes less than 2 seconds.  Neurological:     Mental Status: She is alert and oriented to person, place, and time.  Psychiatric:        Behavior: Behavior normal.      Musculoskeletal Exam: Generalized hyperalgesia and positive tender points on exam.  C-spine is limited and painful range of motion.  Painful limited range of motion of the lumbar spine.  Midline spinal tenderness in lumbar region.  Shoulder joint abduction to about 120 degrees bilaterally.  She has painful and limited internal rotation of the right shoulder.  Elbow joints, wrist joints, MCPs, PIPs and DIPs good range of motion with no synovitis.  She has complete fist formation bilaterally.  Hip joints have limited and painful range of motion.  Knee joints have good range of motion with no warmth or effusion.  Ankle joints have good range of motion no tenderness or inflammation.  CDAI Exam: CDAI Score: -- Patient Global: --; Provider Global: -- Swollen: --; Tender: -- Joint Exam 04/28/2020   No joint exam has been documented for this visit   There is currently no information documented on the homunculus. Go to the Rheumatology activity and complete the  homunculus joint exam.  Investigation: No additional findings.  Imaging: No results found.  Recent Labs: Lab Results  Component Value Date   WBC 7.3 02/01/2020   HGB 14.9 02/01/2020   PLT 423 (H) 02/01/2020   NA 131 (L)  02/01/2020   K 4.2 02/01/2020   CL 92 (L) 02/01/2020   CO2 28 02/01/2020   GLUCOSE 464 (H) 02/01/2020   BUN 13 02/01/2020   CREATININE 0.89 02/01/2020   BILITOT 0.6 02/01/2020   ALKPHOS 101 03/02/2019   AST 16 02/01/2020   ALT 13 02/01/2020   PROT 8.1 02/01/2020   ALBUMIN 4.4 03/02/2019   CALCIUM 9.5 02/01/2020   GFRAA 85 02/01/2020    Speciality Comments: PLQ Eye Exam: 01/16/2019 WNL @ [facility]  Procedures:  No procedures performed Allergies: Pseudoephedrine hcl, Ancef [cefazolin], Clindamycin/lincomycin, Prednisone, Adhesive [tape], Other, and Sudafed [pseudoephedrine hcl]   Assessment / Plan:     Visit Diagnoses: Other organ or system involvement in systemic lupus erythematosus (HCC) - +La, oral ulcers, malar rash, fatigue, photosensitivity, Raynaud's, Subcutaneous lupus-bx+, hair loss, +RF, sicca symptoms: She has not had any signs or symptoms of a systemic lupus flare recently.  She is clinically doing well on methotrexate 8 tablets by mouth once weekly, folic acid 2 mg by mouth daily, and Plaquenil 200 mg 1 tablet twice daily.  She continues to experience nausea after taking oral methotrexate so we discussed switching her to injectable methotrexate.  A new prescription for methotrexate 0.8 mL subcutaneous injections once weekly will be sent to the pharmacy.  She will continue taking folic acid 2 mg by mouth daily.  She has not had any recent rashes, photosensitivity, or hair loss.  No Malar rash was noted on exam.  She was encouraged to wear sunscreen SPF greater than 50 on a daily basis and avoid direct sun exposure she has intermittent symptoms of Raynaud's but no digital ulcerations or signs of gangrene were noted.  She is taking Norvasc as  prescribed.  She continues to have chronic sicca symptom secondary to Sjogren's syndrome.  She uses Biotene products for symptomatic relief.  She continues have chronic fatigue which has been stable recently.  She has no synovitis on exam.  She experiences recurrent oral and nasal ulcerations.  She has no ulcerations at this time.  Lab work from 02/01/2020 was reviewed with the patient today in the office.  Sed rate was 34, complements within normal limits, and double-stranded DNA was negative at that time.  Her lupus lab work is consistent with low disease activity.  We will not make any other medication changes at this time.  She was advised to notify us if she develops any new or worsening symptoms.  She will follow-up in the office in 5 months.  Sjogren's syndrome with other organ involvement ([place]) -She continues to have severe chronic sicca symptoms.  She has no parotid tenderness or swelling on exam.  She continues to use Biotene products for symptomatic relief.  She is not been using any eyedrops recently.  She will continue taking Plaquenil as prescribed.  High risk medication use - PLQ 200 mg 1 tablet BID.  She has been experiencing nausea with oral methotrexate so we will switch her to injectable methotrexate.  She will inject methotrexate 0.8 mL subcutaneously once weekly and continue taking folic acid 2 mg by mouth daily.  CBC and CMP were drawn on 02/01/2020.  She is due to update lab work today.  Orders were released.  She is also due to update her Plaquenil eye exam.  She was given a Plaquenil eye exam to take with her to her upcoming appointment.- Plan: CBC with Differential/Platelet, COMPLETE METABOLIC PANEL WITH GFR  Recurrent oral ulcers: She continues to experience recurrent oral  ulcerations.  She does not have any ulcerations at this time.  Raynaud's syndrome without gangrene: She experiences intermittent symptoms of Raynaud's.  No digital ulcerations or signs of gangrene were noted.  No  obvious nailbed capillary changes were noted.  She has good capillary refill on exam.  She is taking Norvasc 10 mg by mouth daily and Plaquenil 200 mg 1 tablet twice daily.  She advised to notify us if she develops any new or worsening symptoms.  Primary osteoarthritis of both hands: She has PIP and DIP thickening consistent with osteoarthritis of both hands.  No tenderness or synovitis was noted.  She has complete fist formation bilaterally.  Joint protection and muscle strengthening were discussed.  Primary osteoarthritis of both knees: She had SynviscOne injections in bilateral knee joints on 03/03/2020.  She notices significant improvement in the right knee joint pain and stiffness since the injection but continues to have discomfort in the left knee joint.  She has good range of motion of both knee joints on exam today.  No warmth or effusion was noted.  She continues to use a cane to assist with ambulation.  Trochanteric bursitis, left hip: She experiences intermittent discomfort which is exacerbated by lying on her left side at night.  S/P lumbar laminectomy: Chronic pain.  She will be establishing with a new neurosurgeon at [facility] within the upcoming weeks.  She continues to walk with a cane to assist with ambulation.  She is been taking Flexeril 10 mg 3 times daily as needed for muscle spasms.  She is going to discuss a another pain management referral with her PCP at her upcoming office visit.  Fibromyalgia: She has generalized hyperalgesia and positive tender points on exam.  She has been experiencing increased myalgias and muscle tenderness due to fibromyalgia.  Overall her pain level has been increasing over the past several months.  She continues to have chronic neck and lower back pain.  She will be establishing with a new neurosurgeon at [facility] within the upcoming weeks.  She has been taking Flexeril 10 mg by mouth 3 times daily as needed for muscle spasms has been helpful.  She has been sleeping  better at night.  She continues to have chronic fatigue which has been stable overall.  Other medical conditions are listed as follows:   Diabetes mellitus without complication ([place])  Essential hypertension  Mitral valve insufficiency, unspecified etiology  History of chronic kidney disease  Former smoker  Tobacco abuse  Orders: Orders Placed This Encounter  Procedures  . CBC with Differential/Platelet  . COMPLETE METABOLIC PANEL WITH GFR   No orders of the defined types were placed in this encounter.   Follow-Up Instructions: Return in about 5 months (around 09/28/2020) for Systemic lupus erythematosus, Osteoarthritis.   Ofilia Neas, PA-C  Note - This record has been created using Dragon software.  Chart creation errors have been sought, but may not always  have been located. Such creation errors do not reflect on  the standard of medical care.

## 2020-04-28 ENCOUNTER — Other Ambulatory Visit: Payer: Self-pay

## 2020-04-28 ENCOUNTER — Ambulatory Visit: Payer: Medicare Other | Admitting: Physician Assistant

## 2020-04-28 ENCOUNTER — Encounter: Payer: Self-pay | Admitting: Rheumatology

## 2020-04-28 VITALS — BP 149/74 | HR 81 | Resp 16 | Ht 66.0 in | Wt 211.2 lb

## 2020-04-28 DIAGNOSIS — Z9889 Other specified postprocedural states: Secondary | ICD-10-CM

## 2020-04-28 DIAGNOSIS — M170 Bilateral primary osteoarthritis of knee: Secondary | ICD-10-CM

## 2020-04-28 DIAGNOSIS — Z79899 Other long term (current) drug therapy: Secondary | ICD-10-CM | POA: Diagnosis not present

## 2020-04-28 DIAGNOSIS — Z87891 Personal history of nicotine dependence: Secondary | ICD-10-CM

## 2020-04-28 DIAGNOSIS — I7300 Raynaud's syndrome without gangrene: Secondary | ICD-10-CM

## 2020-04-28 DIAGNOSIS — M797 Fibromyalgia: Secondary | ICD-10-CM

## 2020-04-28 DIAGNOSIS — M3509 Sicca syndrome with other organ involvement: Secondary | ICD-10-CM | POA: Diagnosis not present

## 2020-04-28 DIAGNOSIS — M7062 Trochanteric bursitis, left hip: Secondary | ICD-10-CM

## 2020-04-28 DIAGNOSIS — M19042 Primary osteoarthritis, left hand: Secondary | ICD-10-CM

## 2020-04-28 DIAGNOSIS — M19041 Primary osteoarthritis, right hand: Secondary | ICD-10-CM

## 2020-04-28 DIAGNOSIS — M3219 Other organ or system involvement in systemic lupus erythematosus: Secondary | ICD-10-CM | POA: Diagnosis not present

## 2020-04-28 DIAGNOSIS — K1379 Other lesions of oral mucosa: Secondary | ICD-10-CM | POA: Diagnosis not present

## 2020-04-28 DIAGNOSIS — I10 Essential (primary) hypertension: Secondary | ICD-10-CM

## 2020-04-28 DIAGNOSIS — Z87448 Personal history of other diseases of urinary system: Secondary | ICD-10-CM

## 2020-04-28 DIAGNOSIS — Z720 Tobacco use: Secondary | ICD-10-CM

## 2020-04-28 DIAGNOSIS — I340 Nonrheumatic mitral (valve) insufficiency: Secondary | ICD-10-CM

## 2020-04-28 DIAGNOSIS — E119 Type 2 diabetes mellitus without complications: Secondary | ICD-10-CM

## 2020-04-29 ENCOUNTER — Telehealth: Payer: Self-pay | Admitting: Pharmacy Technician

## 2020-04-29 DIAGNOSIS — M3219 Other organ or system involvement in systemic lupus erythematosus: Secondary | ICD-10-CM

## 2020-04-29 LAB — CBC WITH DIFFERENTIAL/PLATELET
Absolute Monocytes: 380 cells/uL (ref 200–950)
Basophils Absolute: 84 cells/uL (ref 0–200)
Basophils Relative: 1.1 %
Eosinophils Absolute: 99 cells/uL (ref 15–500)
Eosinophils Relative: 1.3 %
HCT: 43.6 % (ref 35.0–45.0)
Hemoglobin: 14.5 g/dL (ref 11.7–15.5)
Lymphs Abs: 1847 cells/uL (ref 850–3900)
MCH: 28.3 pg (ref 27.0–33.0)
MCHC: 33.3 g/dL (ref 32.0–36.0)
MCV: 85 fL (ref 80.0–100.0)
MPV: 9.4 fL (ref 7.5–12.5)
Monocytes Relative: 5 %
Neutro Abs: 5191 cells/uL (ref 1500–7800)
Neutrophils Relative %: 68.3 %
Platelets: 356 10*3/uL (ref 140–400)
RBC: 5.13 10*6/uL — ABNORMAL HIGH (ref 3.80–5.10)
RDW: 12.9 % (ref 11.0–15.0)
Total Lymphocyte: 24.3 %
WBC: 7.6 10*3/uL (ref 3.8–10.8)

## 2020-04-29 LAB — COMPLETE METABOLIC PANEL WITH GFR
AG Ratio: 1.2 (calc) (ref 1.0–2.5)
ALT: 12 U/L (ref 6–29)
AST: 19 U/L (ref 10–35)
Albumin: 4.3 g/dL (ref 3.6–5.1)
Alkaline phosphatase (APISO): 110 U/L (ref 37–153)
BUN: 12 mg/dL (ref 7–25)
CO2: 26 mmol/L (ref 20–32)
Calcium: 9.8 mg/dL (ref 8.6–10.4)
Chloride: 95 mmol/L — ABNORMAL LOW (ref 98–110)
Creat: 0.85 mg/dL (ref 0.50–1.05)
GFR, Est African American: 89 mL/min/{1.73_m2} (ref >=60)
GFR, Est Non African American: 77 mL/min/{1.73_m2} (ref >=60)
Globulin: 3.7 g/dL (calc) (ref 1.9–3.7)
Glucose, Bld: 265 mg/dL — ABNORMAL HIGH (ref 65–99)
Potassium: 4.1 mmol/L (ref 3.5–5.3)
Sodium: 134 mmol/L — ABNORMAL LOW (ref 135–146)
Total Bilirubin: 0.9 mg/dL (ref 0.2–1.2)
Total Protein: 8 g/dL (ref 6.1–8.1)

## 2020-04-29 NOTE — Telephone Encounter (Signed)
Submitted a Prior Authorization request to [facility] for RASUVO via Cover My Meds. Will update once we receive a response.  (KeyHeather [id number]

## 2020-04-29 NOTE — Telephone Encounter (Signed)
Received notification from [facility] regarding a prior authorization for RASUVO. Authorization has been APPROVED from 04/29/20 to 12/26/20.   Authorization # [id number] Phone # [phone]   Ran test claim, patient's copay for 1 month is $150.00. Patient has Medicare and can apply for [employer] patient assistance.

## 2020-04-29 NOTE — Progress Notes (Signed)
RBC count is borderline elevated.  Rest of CBC WNL.  Glucose is elevated-265. Please notify the patient.  Sodium and chloride are borderline low but improving.  Rest of CMP WNL.

## 2020-05-02 MED ORDER — "TUBERCULIN SYRINGE 27G X 1/2"" 1 ML MISC": 12.0000 | 3 refills | Status: DC

## 2020-05-02 MED ORDER — METHOTREXATE SODIUM CHEMO INJECTION 50 MG/2ML: 20.0000 mg | INTRAMUSCULAR | 0 refills | Status: DC

## 2020-05-02 NOTE — Telephone Encounter (Signed)
Called to notify patient of received a approval and monthly co-pay of $150.  Patient states that is unaffordable.  Advised there is a patient assistance program she can apply for which is income based.  Patient declined application for patient assistance.  Like to proceed with vial and syringe.  Educated patient on how to use a vial and syringe and reviewed injection technique with patient. She used vial and syringe insulin in the past and feels confident she will be able to administer at home without in office training.    Prescription sent to [facility] for methotrexate 0.8 mL every 7 days along with syringes.    All questions encouraged and answered.  Instructed patient to call with any further questions or concerns.   Mariella Saa, PharmD, [place], [place] Clinical Specialty Pharmacist [phone]  05/02/2020 10:18 AM

## 2020-05-03 ENCOUNTER — Other Ambulatory Visit: Payer: Self-pay | Admitting: Primary Care

## 2020-05-03 DIAGNOSIS — E119 Type 2 diabetes mellitus without complications: Secondary | ICD-10-CM

## 2020-05-14 ENCOUNTER — Encounter: Payer: Self-pay | Admitting: Primary Care

## 2020-05-14 ENCOUNTER — Other Ambulatory Visit: Payer: Self-pay

## 2020-05-14 ENCOUNTER — Ambulatory Visit (INDEPENDENT_AMBULATORY_CARE_PROVIDER_SITE_OTHER): Payer: Medicare Other | Admitting: Primary Care

## 2020-05-14 VITALS — BP 130/78 | HR 92 | Temp 95.7°F | Ht 66.0 in | Wt 210.2 lb

## 2020-05-14 DIAGNOSIS — F3342 Major depressive disorder, recurrent, in full remission: Secondary | ICD-10-CM

## 2020-05-14 DIAGNOSIS — Z8673 Personal history of transient ischemic attack (TIA), and cerebral infarction without residual deficits: Secondary | ICD-10-CM | POA: Diagnosis not present

## 2020-05-14 DIAGNOSIS — M545 Low back pain, unspecified: Secondary | ICD-10-CM

## 2020-05-14 DIAGNOSIS — M797 Fibromyalgia: Secondary | ICD-10-CM

## 2020-05-14 DIAGNOSIS — M19041 Primary osteoarthritis, right hand: Secondary | ICD-10-CM

## 2020-05-14 DIAGNOSIS — M3509 Sicca syndrome with other organ involvement: Secondary | ICD-10-CM

## 2020-05-14 DIAGNOSIS — E119 Type 2 diabetes mellitus without complications: Secondary | ICD-10-CM | POA: Diagnosis not present

## 2020-05-14 DIAGNOSIS — I10 Essential (primary) hypertension: Secondary | ICD-10-CM

## 2020-05-14 DIAGNOSIS — E785 Hyperlipidemia, unspecified: Secondary | ICD-10-CM | POA: Diagnosis not present

## 2020-05-14 DIAGNOSIS — M170 Bilateral primary osteoarthritis of knee: Secondary | ICD-10-CM | POA: Diagnosis not present

## 2020-05-14 DIAGNOSIS — G8929 Other chronic pain: Secondary | ICD-10-CM

## 2020-05-14 DIAGNOSIS — R202 Paresthesia of skin: Secondary | ICD-10-CM

## 2020-05-14 DIAGNOSIS — D472 Monoclonal gammopathy: Secondary | ICD-10-CM | POA: Insufficient documentation

## 2020-05-14 DIAGNOSIS — M19042 Primary osteoarthritis, left hand: Secondary | ICD-10-CM

## 2020-05-14 DIAGNOSIS — M3219 Other organ or system involvement in systemic lupus erythematosus: Secondary | ICD-10-CM

## 2020-05-14 DIAGNOSIS — F419 Anxiety disorder, unspecified: Secondary | ICD-10-CM

## 2020-05-14 LAB — POCT GLYCOSYLATED HEMOGLOBIN (HGB A1C): Hemoglobin A1C: 11.3 % — AB (ref 4.0–5.6)

## 2020-05-14 LAB — LIPID PANEL
Cholesterol: 195 mg/dL (ref 0–200)
HDL: 36.2 mg/dL — ABNORMAL LOW (ref >39.00)
NonHDL: 158.92
Total CHOL/HDL Ratio: 5
Triglycerides: 327 mg/dL — ABNORMAL HIGH (ref 0.0–149.0)
VLDL: 65.4 mg/dL — ABNORMAL HIGH (ref 0.0–40.0)

## 2020-05-14 LAB — LDL CHOLESTEROL, DIRECT: Direct LDL: 116 mg/dL

## 2020-05-14 MED ORDER — FREESTYLE LIBRE 14 DAY SENSOR MISC: 1.0000 | Freq: Three times a day (TID) | 3 refills | Status: DC

## 2020-05-14 MED ORDER — METOPROLOL SUCCINATE ER 50 MG PO TB24: 50.0000 mg | ORAL_TABLET | Freq: Every day | ORAL | 3 refills | Status: AC

## 2020-05-14 MED ORDER — FREESTYLE LIBRE 14 DAY READER DEVI: 1.0000 | Freq: Three times a day (TID) | 0 refills | Status: AC

## 2020-05-14 MED ORDER — INSULIN GLARGINE 100 UNIT/ML SOLOSTAR PEN: 30.0000 [IU] | PEN_INJECTOR | Freq: Every day | SUBCUTANEOUS | 3 refills | Status: DC

## 2020-05-14 NOTE — Assessment & Plan Note (Signed)
Chronic.  Following with rheumatology and compliant to methotrexate and Plaquenil.

## 2020-05-14 NOTE — Assessment & Plan Note (Signed)
Following with rheumatology, no recent rash flares.  Continue as needed creams/ointments.

## 2020-05-14 NOTE — Assessment & Plan Note (Signed)
Following with rheumatology and dermatology. Doing well on pilocarpine.

## 2020-05-14 NOTE — Assessment & Plan Note (Signed)
Previously managed on Crestor, for some reason this was discontinued at some point.  Repeat lipids pending.  Will initiate statin therapy based off of lipid results.

## 2020-05-14 NOTE — Assessment & Plan Note (Signed)
No new symptoms. Compliant to clopidogrel but not on statin therapy. Unsure why she is not on statin therapy, but will repeat lipids today and initiate proper treatment once labs result.

## 2020-05-14 NOTE — Assessment & Plan Note (Signed)
Following with rheumatology, managed on methotrexate and Plaquenil.

## 2020-05-14 NOTE — Assessment & Plan Note (Signed)
Does not feel as though Lexapro helps, but also does not notice much anxiety at this time.  Will wean off Lexapro given potential interaction with Plaquenil.  Follow-up in 1 month.

## 2020-05-14 NOTE — Progress Notes (Signed)
Subjective:    Patient ID: Lauren Winters, female    DOB: [birth year], 54 y.o.   MRN: [id number]  HPI  This visit occurred during the SARS-CoV-2 public health emergency.  Safety protocols were in place, including screening questions prior to the visit, additional usage of staff PPE, and extensive cleaning of exam room while observing appropriate contact time as indicated for disinfecting solutions.   Lauren Winters is a 54 year old female with a medical history of CVA, type 2 diabetes, anxiety, Sjogren's syndrome, chronic back pain, paresthesias, osteoarthritis, lumbar spondylosis who presents today for follow up.  1) Type 2 Diabetes:  Current medications include: Metformin XR 500 mg daily, Lantus 22 units HS.   She is checking her blood glucose 1-2 times weekly and is getting readings of:  AM fasting: high 200's 2 hours after lunch: 180-235 2 hours after dinner: 180-235  Last A1C: 11.3 today Last Eye Exam: UTD Last Foot Exam: Due Pneumonia Vaccination: Completed in 2020 ACE/ARB: ARB Statin: None. Needs.  Wt Readings from Last 3 Encounters:  05/14/20 210 lb 4 oz (95.4 kg)  04/28/20 211 lb 3.2 oz (95.8 kg)  01/28/20 215 lb 9.6 oz (97.8 kg)     2) Essential Hypertension/CVA: Currently managed on amlodipine 10 mg, spironolactone 25 mg, telmisartan-HCTZ 80-25 mg. She has been out of her metoprolol succinate 50 mg for quite sometime, is needing refills. She is followed by nephrology for CKD who changed her to telmisartan-HCTZ recently.  Compliant to clopidogrel, not managed on statin therapy and is unsure why. She is following with neurology.   BP Readings from Last 3 Encounters:  05/14/20 130/78  04/28/20 (!) 149/74  01/28/20 (!) 154/93   3) Fibromyalgia/Chronic Back Pain/Osteoarthritis/Sjogren's/Lupus: Currently managed on methotrexate injections weekly, cyclobenzaprine 10 mg HS, Plaquenil 200 mg BID, clobetasol, triamcinoline and tacrolimus PRN. Following with rheumatology.     4) GAD: Currently managed on Lexapro 20 mg and bupropion XL 150 mg. Her pharmacist notified her that the combination of Lexapro and Plaquenil can cause an irregular heart beat. She would like to try coming off of Lexapro as she doesn't seem to notice any difference. She is compliant to Wellbutrin, thinks this helps somewhat for depression. She has had a rough year in 2020, doing better now.  5) Monoclonal Gammopathy: Discovered in 2020. Followed by hematology, no treatment for now, she is monitored closely. Last visit with hematology was in November 2020.  Review of Systems  Respiratory: Negative for shortness of breath.   Cardiovascular: Negative for chest pain.  Musculoskeletal: Positive for arthralgias and back pain.  Neurological: Positive for numbness. Negative for dizziness and headaches.  Psychiatric/Behavioral:       See HPI       Past Medical History:  Diagnosis Date  . Anxiety   . Atypical chest pain    a. ETT 05/2014: normal stress test without evidence of myocardial ischemia or chest pain at peak stress  . Complication of anesthesia    migraine headache  . Depression   . Diabetes mellitus without complication ([place]) metformin   dx 2015  . Dyspnea    on exertion  . Fibromyalgia   . Headache   . Heart murmur    told back in 2007, but no mention with 2016 cardiac w/u  . Hypertension   . Lupus (systemic lupus erythematosus) ([place])   . Mitral regurgitation    a. 05/2014 echo: EF >55%, nl LV/RV sys fxn, mild MR/AR, no valvular stenosis   .  Obesity (BMI 30-39.9)   . Raynaud's phenomenon   . Rheumatoid arthritis ([place])   . Sjogren's disease ([place])   . Spondylolisthesis of lumbar region   . Tobacco abuse    a. ongoing; b. ongoing SOB     Social History   Socioeconomic History  . Marital status: Married    Spouse name: Not on file  . Number of children: Not on file  . Years of education: Not on file  . Highest education level: Not on file  Occupational History  .  Not on file  Tobacco Use  . Smoking status: Former Smoker    Packs/day: 0.50    Years: 10.00    Pack years: 5.00    Types: Cigarettes    Quit date: 07/22/2015    Years since quitting: 4.8  . Smokeless tobacco: Never Used  Substance and Sexual Activity  . Alcohol use: No    Alcohol/week: 0.0 standard drinks  . Drug use: No  . Sexual activity: Not Currently  Other Topics Concern  . Not on file  Social History Narrative   Married.   3 children.   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    Social Determinants of Health   Financial Resource Strain:   . Difficulty of Paying Living Expenses:   Food Insecurity:   . Worried About Charity fundraiser in the Last Year:   . Arboriculturist in the Last Year:   Transportation Needs:   . Film/video editor (Medical):   Marland Kitchen Lack of Transportation (Non-Medical):   Physical Activity:   . Days of Exercise per Week:   . Minutes of Exercise per Session:   Stress:   . Feeling of Stress :   Social Connections:   . Frequency of Communication with Friends and Family:   . Frequency of Social Gatherings with Friends and Family:   . Attends Religious Services:   . Active Member of Clubs or Organizations:   . Attends Archivist Meetings:   Marland Kitchen Marital Status:   Intimate Partner Violence:   . Fear of Current or Ex-Partner:   . Emotionally Abused:   Marland Kitchen Physically Abused:   . Sexually Abused:     Past Surgical History:  Procedure Laterality Date  . ABDOMINAL HYSTERECTOMY    . BACK SURGERY    . BONE MARROW BIOPSY  10/2019  . CESAREAN SECTION     x3  . LUMBAR LAMINECTOMY    . LUMBAR LAMINECTOMY/DECOMPRESSION MICRODISCECTOMY N/A 03/29/2017   Procedure: Re operative Right Lumbar Three-Four Laminectomy and discectomy with Left Lumbar Three-Four, Bilateral Lumbar Four-Five Laminectomy for decompression;  Surgeon: Kevan Ny Ditty, MD;  Location: [facility];  Service: Neurosurgery;  Laterality: N/A;  .  SPINAL CORD STIMULATOR INSERTION  12/09/2017   Procedure: LUMBAR SPINAL CORD STIMULATOR IMPLANT;  Surgeon: Eustace Moore, MD;  Location: [facility];  Service: Neurosurgery;;  . SPINAL CORD STIMULATOR REMOVAL N/A 01/11/2018   Procedure: LUMBAR SPINAL CORD STIMULATOR REMOVAL;  Surgeon: Eustace Moore, MD;  Location: [facility];  Service: Neurosurgery;  Laterality: N/A;  . TUBAL LIGATION      Family History  Problem Relation Age of Onset  . Hypertension Mother   . Valvular heart disease Mother   . CAD Father 4       CABG x 4  . Liver cancer Father 58       passed  . CAD Paternal 71  CABG  . Breast cancer Paternal Aunt   . CAD Paternal Aunt        CABG  . Breast cancer Paternal Aunt   . CAD Paternal Aunt        CABG  . Breast cancer Paternal Aunt   . CAD Paternal Aunt        CABG  . Breast cancer Paternal Aunt   . CAD Paternal Aunt        CABG  . Breast cancer Paternal Aunt   . CAD Paternal Uncle        MI s/p CABG  . Stroke Maternal Grandmother   . Stroke Maternal Grandfather   . CAD Maternal Grandfather     Allergies  Allergen Reactions  . Pseudoephedrine Hcl Shortness Of Breath and Other (See Comments)    Room starts spinning, can't breath  . Ancef [Cefazolin] Other (See Comments)    Unknown  . Clindamycin/Lincomycin Hives  . Prednisone Other (See Comments)    Chest pain w/ cortisone shots or prednisone pills Elevated blood pressure  . Adhesive [Tape] Rash and Other (See Comments)    Sensitivity to certain band aids and adhesives - burns skin, makes skin raw  . Other Rash and Other (See Comments)    Sensitivity to certain band aids and adhesives - burns skin, makes skin raw  . Sudafed [Pseudoephedrine Hcl] Other (See Comments)    Dizziness     Current Outpatient Medications on File Prior to Visit  Medication Sig Dispense Refill  . amLODipine (NORVASC) 10 MG tablet TAKE 1 TABLET ONCE DAILY FOR BLOOD PRESSURE. 90 tablet 1  . Blood Glucose Monitoring Suppl  (ONETOUCH VERIO FLEX SYSTEM) w/Device KIT Use as instructed to test blood sugar 3 times daily 1 kit 0  . buPROPion (WELLBUTRIN XL) 150 MG 24 hr tablet TAKE 1 TABLET BY MOUTH ONCE DAILY FOR ANXIETY AND DEPRESSION. 90 tablet 1  . clobetasol cream (TEMOVATE) 4.56 % Apply 1 application topically 2 (two) times daily as needed (for lupis rash).    . clopidogrel (PLAVIX) 75 MG tablet Take 1 tablet (75 mg total) by mouth daily. 30 tablet 0  . cyclobenzaprine (FLEXERIL) 10 MG tablet TAKE 1 TABLET BY MOUTH THREE TIMES A DAY AS NEEDED FOR MUSCLE SPASMS 30 tablet 0  . folic acid (FOLVITE) 1 MG tablet Take 2 tablets by mouth daily. 180 tablet 1  . glucose blood test strip Use as instructed to blood sugar 3 times daily 300 each 2  . Halcinonide 0.1 % OINT Apply 1 application topically 2 (two) times daily as needed (for lupis rash).     . hydroxychloroquine (PLAQUENIL) 200 MG tablet Take 1 tablet (200 mg total) by mouth 2 (two) times daily. 60 tablet 0  . insulin glargine (LANTUS) 100 UNIT/ML Solostar Pen Inject 22 Units into the skin at bedtime. 15 mL 0  . Insulin Pen Needle (PEN NEEDLES) 31G X 6 MM MISC Use with insulin as directed. 100 each 0  . metFORMIN (GLUCOPHAGE-XR) 500 MG 24 hr tablet Take 1 tablet every morning with food for diabetes. 30 tablet 0  . methotrexate 50 MG/2ML injection Inject 0.8 mLs (20 mg total) into the skin once a week. 10 mL 0  . pilocarpine (SALAGEN) 5 MG tablet Take 5 mg by mouth daily as needed (for dry mouth).     Marland Kitchen spironolactone (ALDACTONE) 25 MG tablet Take 25 mg by mouth daily.    . tacrolimus (PROTOPIC) 0.1 % ointment Apply 1 application  topically 2 (two) times daily as needed (for lupis rash).     Marland Kitchen telmisartan-hydrochlorothiazide (MICARDIS HCT) 80-25 MG tablet Take by mouth.    . Triamcinolone Acetonide (TRIAMCINOLONE 0.1 % CREAM : EUCERIN) CREA Apply 1 application topically 2 (two) times daily as needed for rash or irritation.     . TUBERCULIN SYR 1CC/27GX1/2" (B-D TB  SYRINGE 1CC/27GX1/2") 27G X 1/2" 1 ML MISC 12 Syringes by Does not apply route once a week. 12 each 3  . VITAMIN D PO Take by mouth.     No current facility-administered medications on file prior to visit.    BP 130/78   Pulse 92   Temp (!) 95.7 F (35.4 C) (Temporal)   Ht 5' 6"  (1.676 m)   Wt 210 lb 4 oz (95.4 kg)   SpO2 98%   BMI 33.94 kg/m    Objective:   Physical Exam  Constitutional: She appears well-nourished.  Cardiovascular: Normal rate and regular rhythm.  Respiratory: Effort normal and breath sounds normal.  Musculoskeletal:     Cervical back: Neck supple.  Skin: Skin is warm and dry.  Psychiatric: She has a normal mood and affect.           Assessment & Plan:

## 2020-05-14 NOTE — Assessment & Plan Note (Signed)
Well controlled today, refill provided for metoprolol succinate. Continue other medications. CMP reviewed from early May 2021.

## 2020-05-14 NOTE — Assessment & Plan Note (Signed)
Following with rheumatology, using cyclobenzaprine at night to help sleep/relax.  Also managed on methotrexate and Plaquenil.

## 2020-05-14 NOTE — Patient Instructions (Signed)
Wean off of Lexapro. Take 1/2 tablet daily for 2 weeks until bottle is empty.  Start checking your blood sugar levels.  Appropriate times to check your blood sugar levels are:  -Before any meal (breakfast, lunch, dinner) -Two hours after any meal (breakfast, lunch, dinner) -Bedtime  Record your readings and notify me if you continue to consistently run at or above 200   Increase your Lantus to 30 units at bedtime. Continue Metformin once daily.  Stop by the lab prior to leaving today. I will notify you of your results once received.   Please schedule a follow up appointment in 1 month for diabetes.  It was a pleasure to see you today!

## 2020-05-14 NOTE — Assessment & Plan Note (Addendum)
Following with rheumatology, compliant to methotrexate and Plaquenil.

## 2020-05-14 NOTE — Assessment & Plan Note (Signed)
Chronic, stable.  No longer on gabapentin.

## 2020-05-14 NOTE — Assessment & Plan Note (Addendum)
Uncontrolled with A1c today of 11.3.  She is also not checking her glucose levels regularly.  Strongly recommended she check glucose readings at least twice daily, glucose logs handed out today.  Prescription for freestyle libre meter and sensor sent to pharmacy.  Increase Lantus to 30 units.  Continue Metformin XR 500 once daily.  Foot exam today. We will initiate statin therapy once lipids return. Managed on ARB. Eye exam up-to-date. Pneumonia vaccination up-to-date.  Follow-up in 1 month with glucose logs.

## 2020-05-14 NOTE — Assessment & Plan Note (Signed)
Following with hematology through [facility], plan for now is to monitor.  No cancerous cells at this point.

## 2020-05-14 NOTE — Addendum Note (Signed)
Addended by: Pleas Koch on: 05/14/2020 12:56 PM   Modules accepted: Orders

## 2020-05-14 NOTE — Assessment & Plan Note (Signed)
Deteriorated over 2020, now starting to feel better.  Continue Wellbutrin.  Will wean off Lexapro due to interaction with Plaquenil.  Follow-up in 1 month.

## 2020-05-15 ENCOUNTER — Other Ambulatory Visit: Payer: Self-pay | Admitting: Primary Care

## 2020-05-15 DIAGNOSIS — E785 Hyperlipidemia, unspecified: Secondary | ICD-10-CM

## 2020-05-15 MED ORDER — ROSUVASTATIN CALCIUM 10 MG PO TABS: 10.0000 mg | ORAL_TABLET | Freq: Every evening | ORAL | 3 refills | Status: DC

## 2020-05-18 ENCOUNTER — Other Ambulatory Visit: Payer: Self-pay | Admitting: Primary Care

## 2020-05-18 DIAGNOSIS — M545 Low back pain, unspecified: Secondary | ICD-10-CM

## 2020-05-18 DIAGNOSIS — E119 Type 2 diabetes mellitus without complications: Secondary | ICD-10-CM

## 2020-06-05 ENCOUNTER — Other Ambulatory Visit: Payer: Self-pay | Admitting: Primary Care

## 2020-06-05 DIAGNOSIS — M545 Low back pain, unspecified: Secondary | ICD-10-CM

## 2020-06-05 DIAGNOSIS — G8929 Other chronic pain: Secondary | ICD-10-CM

## 2020-06-06 DIAGNOSIS — R778 Other specified abnormalities of plasma proteins: Secondary | ICD-10-CM | POA: Diagnosis not present

## 2020-06-06 DIAGNOSIS — C9000 Multiple myeloma not having achieved remission: Secondary | ICD-10-CM | POA: Diagnosis not present

## 2020-06-06 NOTE — Telephone Encounter (Signed)
Refills sent to pharmacy. 

## 2020-06-06 NOTE — Telephone Encounter (Signed)
Last prescribed on 04/17/2020 Last OV (follow up) with Allie Bossier on 05/14/2020 Future OV scheduled on 06/18/2020

## 2020-06-18 ENCOUNTER — Ambulatory Visit: Payer: Medicare Other | Admitting: Primary Care

## 2020-06-18 NOTE — Telephone Encounter (Signed)
Cancel as instructed.

## 2020-06-18 NOTE — Telephone Encounter (Signed)
Lauren Winters. Can you remove her from the schedule today?

## 2020-07-10 ENCOUNTER — Other Ambulatory Visit: Payer: Self-pay | Admitting: Primary Care

## 2020-07-10 ENCOUNTER — Other Ambulatory Visit: Payer: Self-pay | Admitting: Rheumatology

## 2020-07-10 DIAGNOSIS — I639 Cerebral infarction, unspecified: Secondary | ICD-10-CM

## 2020-07-10 DIAGNOSIS — F419 Anxiety disorder, unspecified: Secondary | ICD-10-CM

## 2020-07-10 NOTE — Telephone Encounter (Signed)
Last Visit: 04/28/2020 Next visit: 10/01/2020 Labs: 04/28/2020 RBC count is borderline elevated. Rest of CBC WNL. Glucose is elevated-265. Sodium and chloride are borderline low but improving. Rest of CMP WNL.  PLQ Eye Exam: 01/16/2019 WNL   Current Dose per office note on 04/28/2020: Plaquenil 200 mg 1 tablet twice daily.  Okay to refill PLQ?

## 2020-07-13 NOTE — Telephone Encounter (Signed)
Refills sent to pharmacy. 

## 2020-07-21 DIAGNOSIS — M5416 Radiculopathy, lumbar region: Secondary | ICD-10-CM | POA: Diagnosis not present

## 2020-07-21 DIAGNOSIS — M5412 Radiculopathy, cervical region: Secondary | ICD-10-CM | POA: Diagnosis not present

## 2020-07-21 DIAGNOSIS — M5136 Other intervertebral disc degeneration, lumbar region: Secondary | ICD-10-CM | POA: Diagnosis not present

## 2020-07-21 DIAGNOSIS — Z981 Arthrodesis status: Secondary | ICD-10-CM | POA: Diagnosis not present

## 2020-07-21 DIAGNOSIS — M961 Postlaminectomy syndrome, not elsewhere classified: Secondary | ICD-10-CM | POA: Diagnosis not present

## 2020-07-21 DIAGNOSIS — E1122 Type 2 diabetes mellitus with diabetic chronic kidney disease: Secondary | ICD-10-CM | POA: Diagnosis not present

## 2020-07-21 DIAGNOSIS — I10 Essential (primary) hypertension: Secondary | ICD-10-CM | POA: Diagnosis not present

## 2020-07-21 DIAGNOSIS — M47816 Spondylosis without myelopathy or radiculopathy, lumbar region: Secondary | ICD-10-CM | POA: Diagnosis not present

## 2020-07-21 DIAGNOSIS — Z7902 Long term (current) use of antithrombotics/antiplatelets: Secondary | ICD-10-CM | POA: Diagnosis not present

## 2020-07-21 DIAGNOSIS — Z794 Long term (current) use of insulin: Secondary | ICD-10-CM | POA: Diagnosis not present

## 2020-07-21 DIAGNOSIS — M069 Rheumatoid arthritis, unspecified: Secondary | ICD-10-CM | POA: Diagnosis not present

## 2020-07-21 DIAGNOSIS — Z8673 Personal history of transient ischemic attack (TIA), and cerebral infarction without residual deficits: Secondary | ICD-10-CM | POA: Diagnosis not present

## 2020-07-21 DIAGNOSIS — Z87891 Personal history of nicotine dependence: Secondary | ICD-10-CM | POA: Diagnosis not present

## 2020-07-21 DIAGNOSIS — M797 Fibromyalgia: Secondary | ICD-10-CM | POA: Diagnosis not present

## 2020-08-04 ENCOUNTER — Telehealth: Payer: Self-pay | Admitting: Primary Care

## 2020-08-04 ENCOUNTER — Telehealth: Payer: Self-pay

## 2020-08-04 NOTE — Telephone Encounter (Signed)
Record request from [employer] received and sent to [facility]

## 2020-08-04 NOTE — Telephone Encounter (Signed)
Left message asking pt to call office  Received paperwork from [employer].  I wanted to let pt know Anda Kraft will be out of office till 8/18  Spoke with Mia @ [employer] to let her know Anda Kraft will be out till 8/18    Paperwork in kate's in box

## 2020-08-06 NOTE — Telephone Encounter (Signed)
Kim with [employer] left v/m requesting status of paperwork that was faxed to Gentry Fitz NP on 08/04/20. Claim # [id number]. I called [employer] and spoke  With [facility]  and notified as instructed by Shirlean Mylar in this phone note and Maylon Cos voiced understanding and will make notation in pts file.

## 2020-08-13 ENCOUNTER — Telehealth: Payer: Self-pay | Admitting: *Deleted

## 2020-08-13 NOTE — Telephone Encounter (Signed)
Attempted to contact patient and left message to advise patient we received paperwork and per Dr. Estanislado Pandy we will not be able to fill it out. Patient advised she will need to have a functional capacity evaluation. Patient advised we doe not perform them.

## 2020-08-15 NOTE — Telephone Encounter (Signed)
I called patient and left voicemail stating that I had to ask her a few questions about this paperwork, I have yet to hear from patient. Will try again next week.

## 2020-08-18 ENCOUNTER — Ambulatory Visit (INDEPENDENT_AMBULATORY_CARE_PROVIDER_SITE_OTHER): Payer: Medicare Other | Admitting: Primary Care

## 2020-08-18 ENCOUNTER — Other Ambulatory Visit: Payer: Self-pay

## 2020-08-18 ENCOUNTER — Encounter: Payer: Self-pay | Admitting: Primary Care

## 2020-08-18 VITALS — BP 126/84 | HR 80 | Temp 95.8°F | Ht 66.0 in | Wt 207.0 lb

## 2020-08-18 DIAGNOSIS — E119 Type 2 diabetes mellitus without complications: Secondary | ICD-10-CM | POA: Diagnosis not present

## 2020-08-18 DIAGNOSIS — R590 Localized enlarged lymph nodes: Secondary | ICD-10-CM | POA: Insufficient documentation

## 2020-08-18 LAB — POCT GLYCOSYLATED HEMOGLOBIN (HGB A1C): Hemoglobin A1C: 7.6 % — AB (ref 4.0–5.6)

## 2020-08-18 NOTE — Telephone Encounter (Signed)
Completed and placed on [place] desk. 

## 2020-08-18 NOTE — Progress Notes (Signed)
Subjective:    Patient ID: Lauren Winters, female    DOB: [birth year], 54 y.o.   MRN: [id number]  HPI  This visit occurred during the SARS-CoV-2 public health emergency.  Safety protocols were in place, including screening questions prior to the visit, additional usage of staff PPE, and extensive cleaning of exam room while observing appropriate contact time as indicated for disinfecting solutions.   Lauren Winters is a 55 year old female with a significant medical history including type 2 diabetes, hypertension, osteoarthritis, chronic pain, sjogren's syndrome, hyperlipidemia, monoclonal gammopathy, CVA, fibromyalgia who presents today for follow up of diabetes.   She's also noticed painful and non painful lumps to her right lateral neck, left post auricular head, left lateral head. She's noticed these lumps for about one week, they appear to be enlarging.   Current medications for diabetes include: Metformin XR 500 mg, Lantus 35 units daily.  She is checking her blood glucose 4-5 times daily and is getting readings of:  AM fasting: 150's Mid morning: 150's-160's 2 hours after lunch: 130's-140's 2 hours after dinner: low 200's  Last A1C: 11.3 in May 2021 Last Eye Exam: Due, she will schedule  Last Foot Exam: UTD Pneumonia Vaccination: UTD ACE/ARB: ARB Statin: Crestor  BP Readings from Last 3 Encounters:  08/18/20 126/84  05/14/20 130/78  04/28/20 (!) 149/74     Review of Systems  Respiratory: Negative for shortness of breath.   Cardiovascular: Negative for chest pain.  Skin: Negative for color change.  Neurological: Negative for dizziness.  Hematological: Positive for adenopathy.       Past Medical History:  Diagnosis Date  . Anxiety   . Atypical chest pain    a. ETT 05/2014: normal stress test without evidence of myocardial ischemia or chest pain at peak stress  . Complication of anesthesia    migraine headache  . Depression   . Diabetes mellitus without  complication ([place]) metformin   dx 2015  . Dyspnea    on exertion  . Fibromyalgia   . Headache   . Heart murmur    told back in 2007, but no mention with 2016 cardiac w/u  . Hypertension   . Lupus (systemic lupus erythematosus) ([place])   . Mitral regurgitation    a. 05/2014 echo: EF >55%, nl LV/RV sys fxn, mild MR/AR, no valvular stenosis   . Obesity (BMI 30-39.9)   . Raynaud's phenomenon   . Rheumatoid arthritis ([place])   . S/P lumbar laminectomy 12/09/2017  . S/P lumbar spinal fusion 05/26/2018  . Sjogren's disease ([place])   . Spondylolisthesis of lumbar region   . Tobacco abuse    a. ongoing; b. ongoing SOB  . Wound infection after surgery 01/11/2018     Social History   Socioeconomic History  . Marital status: Married    Spouse name: Not on file  . Number of children: Not on file  . Years of education: Not on file  . Highest education level: Not on file  Occupational History  . Not on file  Tobacco Use  . Smoking status: Former Smoker    Packs/day: 0.50    Years: 10.00    Pack years: 5.00    Types: Cigarettes    Quit date: 07/22/2015    Years since quitting: 5.0  . Smokeless tobacco: Never Used  Vaping Use  . Vaping Use: Never used  Substance and Sexual Activity  . Alcohol use: No    Alcohol/week: 0.0 standard drinks  . Drug  use: No  . Sexual activity: Not Currently  Other Topics Concern  . Not on file  Social History Narrative   Married.   3 children.   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    Social Determinants of Health   Financial Resource Strain:   . Difficulty of Paying Living Expenses: Not on file  Food Insecurity:   . Worried About Charity fundraiser in the Last Year: Not on file  . Ran Out of Food in the Last Year: Not on file  Transportation Needs:   . Lack of Transportation (Medical): Not on file  . Lack of Transportation (Non-Medical): Not on file  Physical Activity:   . Days of Exercise per Week: Not on  file  . Minutes of Exercise per Session: Not on file  Stress:   . Feeling of Stress : Not on file  Social Connections:   . Frequency of Communication with Friends and Family: Not on file  . Frequency of Social Gatherings with Friends and Family: Not on file  . Attends Religious Services: Not on file  . Active Member of Clubs or Organizations: Not on file  . Attends Archivist Meetings: Not on file  . Marital Status: Not on file  Intimate Partner Violence:   . Fear of Current or Ex-Partner: Not on file  . Emotionally Abused: Not on file  . Physically Abused: Not on file  . Sexually Abused: Not on file    Past Surgical History:  Procedure Laterality Date  . ABDOMINAL HYSTERECTOMY    . BACK SURGERY    . BONE MARROW BIOPSY  10/2019  . CESAREAN SECTION     x3  . LUMBAR LAMINECTOMY    . LUMBAR LAMINECTOMY/DECOMPRESSION MICRODISCECTOMY N/A 03/29/2017   Procedure: Re operative Right Lumbar Three-Four Laminectomy and discectomy with Left Lumbar Three-Four, Bilateral Lumbar Four-Five Laminectomy for decompression;  Surgeon: Kevan Ny Ditty, MD;  Location: [facility];  Service: Neurosurgery;  Laterality: N/A;  . SPINAL CORD STIMULATOR INSERTION  12/09/2017   Procedure: LUMBAR SPINAL CORD STIMULATOR IMPLANT;  Surgeon: Eustace Moore, MD;  Location: [facility];  Service: Neurosurgery;;  . SPINAL CORD STIMULATOR REMOVAL N/A 01/11/2018   Procedure: LUMBAR SPINAL CORD STIMULATOR REMOVAL;  Surgeon: Eustace Moore, MD;  Location: [facility];  Service: Neurosurgery;  Laterality: N/A;  . TUBAL LIGATION      Family History  Problem Relation Age of Onset  . Hypertension Mother   . Valvular heart disease Mother   . CAD Father 74       CABG x 4  . Liver cancer Father 73       passed  . CAD Paternal Aunt        CABG  . Breast cancer Paternal Aunt   . CAD Paternal Aunt        CABG  . Breast cancer Paternal Aunt   . CAD Paternal Aunt        CABG  . Breast cancer Paternal Aunt   . CAD Paternal  Aunt        CABG  . Breast cancer Paternal Aunt   . CAD Paternal Aunt        CABG  . Breast cancer Paternal Aunt   . CAD Paternal Uncle        MI s/p CABG  . Stroke Maternal Grandmother   . Stroke Maternal Grandfather   . CAD Maternal Grandfather     Allergies  Allergen Reactions  . Pseudoephedrine Hcl Shortness Of Breath and Other (See Comments)    Room starts spinning, can't breath  . Ancef [Cefazolin] Other (See Comments)    Unknown  . Clindamycin/Lincomycin Hives  . Prednisone Other (See Comments)    Chest pain w/ cortisone shots or prednisone pills Elevated blood pressure  . Adhesive [Tape] Rash and Other (See Comments)    Sensitivity to certain band aids and adhesives - burns skin, makes skin raw  . Other Rash and Other (See Comments)    Sensitivity to certain band aids and adhesives - burns skin, makes skin raw  . Sudafed [Pseudoephedrine Hcl] Other (See Comments)    Dizziness     Current Outpatient Medications on File Prior to Visit  Medication Sig Dispense Refill  . amLODipine (NORVASC) 10 MG tablet TAKE 1 TABLET ONCE DAILY FOR BLOOD PRESSURE. 90 tablet 1  . Blood Glucose Monitoring Suppl (ONETOUCH VERIO FLEX SYSTEM) w/Device KIT Use as instructed to test blood sugar 3 times daily 1 kit 0  . buPROPion (WELLBUTRIN XL) 150 MG 24 hr tablet TAKE 1 TABLET BY MOUTH ONCE DAILY FOR ANXIETY AND DEPRESSION. 90 tablet 1  . clopidogrel (PLAVIX) 75 MG tablet TAKE 1 TABLET BY MOUTH EVERY DAY 90 tablet 3  . Continuous Blood Gluc Receiver (FREESTYLE LIBRE 14 DAY READER) DEVI 1 Device by Does not apply route in the morning, at noon, and at bedtime. 1 each 0  . Continuous Blood Gluc Sensor (FREESTYLE LIBRE 14 DAY SENSOR) MISC 1 Device by Does not apply route in the morning, at noon, and at bedtime. 2 each 3  . cyclobenzaprine (FLEXERIL) 10 MG tablet Take 1 tablet (10 mg total) by mouth at bedtime as needed for muscle spasms. 90 tablet 1  . escitalopram (LEXAPRO) 20 MG tablet Take 1  tablet (20 mg total) by mouth daily. For anxiety and depression. 90 tablet 1  . folic acid (FOLVITE) 1 MG tablet Take 2 tablets by mouth daily. 180 tablet 1  . glucose blood test strip Use as instructed to blood sugar 3 times daily 300 each 2  . hydroxychloroquine (PLAQUENIL) 200 MG tablet TAKE 1 TABLET BY MOUTH TWICE A DAY 180 tablet 0  . insulin glargine (LANTUS) 100 UNIT/ML Solostar Pen Inject 30 Units into the skin at bedtime. 15 mL 3  . Insulin Pen Needle (PEN NEEDLES) 31G X 6 MM MISC Use with insulin as directed. 100 each 0  . metFORMIN (GLUCOPHAGE-XR) 500 MG 24 hr tablet TAKE 1 TABLET EVERY MORNING WITH FOOD FOR DIABETES. 90 tablet 1  . methotrexate 50 MG/2ML injection Inject 0.8 mLs (20 mg total) into the skin once a week. 10 mL 0  . metoprolol succinate (TOPROL-XL) 50 MG 24 hr tablet Take 1 tablet (50 mg total) by mouth daily. For blood pressure 90 tablet 3  . pilocarpine (SALAGEN) 5 MG tablet Take 5 mg by mouth daily as needed (for dry mouth).     . rosuvastatin (CRESTOR) 10 MG tablet Take 1 tablet (10 mg total) by mouth every evening. For cholesterol. 90 tablet 3  . spironolactone (ALDACTONE) 25 MG tablet Take 25 mg by mouth daily.    Marland Kitchen telmisartan-hydrochlorothiazide (MICARDIS HCT) 80-25 MG tablet Take by mouth.    . TUBERCULIN SYR 1CC/27GX1/2" (B-D TB SYRINGE 1CC/27GX1/2") 27G X 1/2" 1 ML MISC 12 Syringes by Does not apply route once a week. 12 each 3   No current facility-administered medications on file prior to visit.  BP 126/84   Pulse 80   Temp (!) 95.8 F (35.4 C) (Temporal)   Ht _0  (1.676 m)   Wt 207 lb (93.9 kg)   SpO2 98%   BMI 33.41 kg/m    Objective:   Physical Exam Neck:     Comments: Non tender, soft, immobile 1-2 cm, rounded mass to right upper lateral neck. Several smaller, tender, immobile, soft lumps to left side of head. Cardiovascular:     Rate and Rhythm: Normal rate and regular rhythm.  Pulmonary:     Effort: Pulmonary effort is normal.      Breath sounds: Normal breath sounds.  Musculoskeletal:     Cervical back: Neck supple.  Skin:    General: Skin is warm and dry.            Assessment & Plan:

## 2020-08-18 NOTE — Telephone Encounter (Signed)
Paperwork faxed °

## 2020-08-18 NOTE — Assessment & Plan Note (Signed)
A1C improved to 7.6 from 11.3 last visit, commended her on this!!  Continue Lantus 35 units daily. Continue Metformin XR 500 mg.  Work on diet, increase activity as tolerated. She will schedule eye exam. Foot exam UTD. Managed on statin and ARB.  Follow up in 6 months.

## 2020-08-18 NOTE — Assessment & Plan Note (Signed)
Evident on exam, some tender and some non tender. Ultrasound ordered and pending to rule out suspicious cause.

## 2020-08-18 NOTE — Patient Instructions (Signed)
You will be contacted regarding your ultrasound.  Please let us know if you have not been contacted within two weeks.   Continue taking Lantus 35 units daily for diabetes. Continue taking Metformin daily for diabetes.  Continue to monitor blood sugars.  Please schedule a follow up appointment in 6 months.  It was a pleasure to see you today!

## 2020-08-19 ENCOUNTER — Telehealth: Payer: Self-pay

## 2020-08-19 NOTE — Telephone Encounter (Signed)
I left VM for patient to return phone call in regards to scheduling [place]

## 2020-08-21 NOTE — Telephone Encounter (Signed)
Left message asking pt to call office Please let pt know her paperwork faxed 8/23 and there is a copy here for her  Copy for pt  Copy for scan

## 2020-08-26 DIAGNOSIS — M48061 Spinal stenosis, lumbar region without neurogenic claudication: Secondary | ICD-10-CM | POA: Diagnosis not present

## 2020-08-26 DIAGNOSIS — M961 Postlaminectomy syndrome, not elsewhere classified: Secondary | ICD-10-CM | POA: Diagnosis not present

## 2020-08-26 DIAGNOSIS — R200 Anesthesia of skin: Secondary | ICD-10-CM | POA: Diagnosis not present

## 2020-08-26 DIAGNOSIS — M4726 Other spondylosis with radiculopathy, lumbar region: Secondary | ICD-10-CM | POA: Diagnosis not present

## 2020-08-26 DIAGNOSIS — M4802 Spinal stenosis, cervical region: Secondary | ICD-10-CM | POA: Diagnosis not present

## 2020-08-26 DIAGNOSIS — M4722 Other spondylosis with radiculopathy, cervical region: Secondary | ICD-10-CM | POA: Diagnosis not present

## 2020-08-27 ENCOUNTER — Other Ambulatory Visit: Payer: Self-pay

## 2020-08-27 ENCOUNTER — Ambulatory Visit
Admission: RE | Admit: 2020-08-27 | Discharge: 2020-08-27 | Disposition: A | Discharge status: Home / Self Care | Payer: Medicare Other | Source: Ambulatory Visit | Attending: Primary Care | Admitting: Primary Care

## 2020-08-27 DIAGNOSIS — R590 Localized enlarged lymph nodes: Secondary | ICD-10-CM

## 2020-09-02 DIAGNOSIS — M48061 Spinal stenosis, lumbar region without neurogenic claudication: Secondary | ICD-10-CM | POA: Diagnosis not present

## 2020-09-02 DIAGNOSIS — M5412 Radiculopathy, cervical region: Secondary | ICD-10-CM | POA: Diagnosis not present

## 2020-09-03 ENCOUNTER — Other Ambulatory Visit: Payer: Self-pay | Admitting: Primary Care

## 2020-09-03 DIAGNOSIS — F419 Anxiety disorder, unspecified: Secondary | ICD-10-CM

## 2020-09-03 DIAGNOSIS — F32A Depression, unspecified: Secondary | ICD-10-CM

## 2020-09-04 DIAGNOSIS — M5416 Radiculopathy, lumbar region: Secondary | ICD-10-CM | POA: Diagnosis not present

## 2020-09-05 DIAGNOSIS — Z79899 Other long term (current) drug therapy: Secondary | ICD-10-CM

## 2020-09-05 MED ORDER — METHOTREXATE 2.5 MG PO TABS
20.0000 mg | ORAL_TABLET | ORAL | 0 refills | Status: DC
Start: 2020-09-05 — End: 2020-11-18

## 2020-09-05 NOTE — Telephone Encounter (Signed)
Ok to send in methotrexate 8 tablets by mouth once weekly.  Please remove injectable MTX from her medication list.   Ok to refer the patient to an ophthalmologist in [place] or [place] in order for her to complete the [facility] eye exam.

## 2020-09-15 DIAGNOSIS — I10 Essential (primary) hypertension: Secondary | ICD-10-CM | POA: Diagnosis not present

## 2020-09-15 DIAGNOSIS — M329 Systemic lupus erythematosus, unspecified: Secondary | ICD-10-CM | POA: Diagnosis not present

## 2020-09-15 DIAGNOSIS — R079 Chest pain, unspecified: Secondary | ICD-10-CM | POA: Diagnosis not present

## 2020-10-01 ENCOUNTER — Other Ambulatory Visit: Payer: Self-pay | Admitting: Physician Assistant

## 2020-10-01 ENCOUNTER — Ambulatory Visit: Payer: Medicare Other | Admitting: Physician Assistant

## 2020-10-03 DIAGNOSIS — N182 Chronic kidney disease, stage 2 (mild): Secondary | ICD-10-CM | POA: Diagnosis not present

## 2020-10-03 DIAGNOSIS — M329 Systemic lupus erythematosus, unspecified: Secondary | ICD-10-CM | POA: Diagnosis not present

## 2020-10-06 NOTE — Progress Notes (Signed)
Office Visit Note  Patient: Lauren Winters             Date of Birth: 11-03-1966           MRN: [id number]             PCP: Pleas Koch, NP Referring: Pleas Koch, NP Visit Date: 10/20/2020 Occupation: @GUAROCC @  Subjective:  Low back pain   History of Present Illness: Lauren Winters is a 54 y.o. female with history of systemic lupus erythematosus and osteoarthritis.  She is taking Methotrexate 8 tablets by mouth once weekly, folic acid 2 mg po daily, and plaquenil 200 mg 1 tablet by mouth twice daily. She has not missed any doses of MTX or PLQ.  She denies any symptoms of a lupus flare.  She denies any recent rashes.  She has recurrent oral ulcerations and worsening sicca symptoms.  She has been using biotene products.  She has intermittent symptoms of raynaud's but denies any digital ulcerations.  She continues to take norvasc and plavix as prescribed.  She has chronic lower back pain and has established care with Dr. Tally Joe.  She reports she will need surgery but is trying to postpone it at this time.  She currently rates her pain a 8/10.  She has been experiencing intermittent discomfort in the right knee joint and right 2nd MCP joint.  She denies any joint swelling.  She denies any recent infections.  She has received both covid-19 vaccine doses and plans on receiving the 3rd dose.   She has a PLQ eye exam scheduled Friday and an upcoming hematology appointment on 11/17/20.    Activities of Daily Living:  Patient reports morning stiffness for 4-5  hours.   Patient Reports nocturnal pain.  Difficulty dressing/grooming: Reports Difficulty climbing stairs: Reports Difficulty getting out of chair: Reports Difficulty using hands for taps, buttons, cutlery, and/or writing: Reports  Review of Systems  Constitutional: Positive for fatigue.  HENT: Positive for mouth dryness and nose dryness. Negative for mouth sores.   Eyes: Positive for pain and dryness. Negative for  visual disturbance.  Respiratory: Positive for shortness of breath. Negative for cough, hemoptysis and difficulty breathing.   Cardiovascular: Negative for chest pain, palpitations, hypertension and swelling in legs/feet.  Gastrointestinal: Negative for blood in stool, constipation and diarrhea.  Endocrine: Negative for increased urination.  Genitourinary: Positive for difficulty urinating. Negative for painful urination.  Musculoskeletal: Positive for arthralgias, joint pain and morning stiffness. Negative for joint swelling, myalgias, muscle weakness, muscle tenderness and myalgias.  Skin: Positive for hair loss. Negative for color change, pallor, rash, nodules/bumps, skin tightness, ulcers and sensitivity to sunlight.  Allergic/Immunologic: Negative for susceptible to infections.  Neurological: Positive for numbness and headaches. Negative for dizziness and weakness.  Hematological: Negative for swollen glands.  Psychiatric/Behavioral: Positive for sleep disturbance. Negative for depressed mood. The patient is not nervous/anxious.     PMFS History:  Patient Active Problem List   Diagnosis Date Noted  . Adenopathy, cervical 08/18/2020  . Monoclonal gammopathy 05/14/2020  . Paresthesias 03/02/2019  . History of CVA (cerebrovascular accident) 08/19/2018  . High risk medication use 02/03/2018  . HNP (herniated nucleus pulposus), lumbar 03/29/2017  . Chronic right shoulder pain 02/18/2017  . Primary osteoarthritis of both hands 12/22/2016  . Primary osteoarthritis of both knees 12/22/2016  . Spondylosis of lumbar region without myelopathy or radiculopathy 12/22/2016  . Fibromyalgia 12/22/2016  . Preventative health care 04/19/2016  . Chronic back pain 12/17/2015  .  Hyperlipidemia 08/29/2015  . Other organ or system involvement in systemic lupus erythematosus ([place]) 08/04/2015  . Rheumatoid factor positive 08/04/2015  . Sjogren's syndrome ([place]) 08/04/2015  . Mitral regurgitation   .  Tobacco abuse   . Atypical chest pain   . Obesity (BMI 30-39.9)   . Anxiety   . Hypertension   . Diabetes mellitus without complication ([place])   . Clinical depression 05/23/2014    Past Medical History:  Diagnosis Date  . Anxiety   . Atypical chest pain    a. ETT 05/2014: normal stress test without evidence of myocardial ischemia or chest pain at peak stress  . Complication of anesthesia    migraine headache  . Depression   . Diabetes mellitus without complication ([place]) metformin   dx 2015  . Dyspnea    on exertion  . Fibromyalgia   . Headache   . Heart murmur    told back in 2007, but no mention with 2016 cardiac w/u  . Hypertension   . Lupus (systemic lupus erythematosus) ([place])   . Mitral regurgitation    a. 05/2014 echo: EF >55%, nl LV/RV sys fxn, mild MR/AR, no valvular stenosis   . Obesity (BMI 30-39.9)   . Raynaud's phenomenon   . Rheumatoid arthritis ([place])   . S/P lumbar laminectomy 12/09/2017  . S/P lumbar spinal fusion 05/26/2018  . Sjogren's disease ([place])   . Spondylolisthesis of lumbar region   . Tobacco abuse    a. ongoing; b. ongoing SOB  . Wound infection after surgery 01/11/2018    Family History  Problem Relation Age of Onset  . Hypertension Mother   . Valvular heart disease Mother   . CAD Father 42       CABG x 4  . Liver cancer Father 18       passed  . CAD Paternal Aunt        CABG  . Breast cancer Paternal Aunt   . CAD Paternal Aunt        CABG  . Breast cancer Paternal Aunt   . CAD Paternal Aunt        CABG  . Breast cancer Paternal Aunt   . CAD Paternal Aunt        CABG  . Breast cancer Paternal Aunt   . CAD Paternal Aunt        CABG  . Breast cancer Paternal Aunt   . CAD Paternal Uncle        MI s/p CABG  . Stroke Maternal Grandmother   . Stroke Maternal Grandfather   . CAD Maternal Grandfather    Past Surgical History:  Procedure Laterality Date  . ABDOMINAL HYSTERECTOMY    . BACK SURGERY    . BONE MARROW BIOPSY  10/2019  .  CESAREAN SECTION     x3  . LUMBAR LAMINECTOMY    . LUMBAR LAMINECTOMY/DECOMPRESSION MICRODISCECTOMY N/A 03/29/2017   Procedure: Re operative Right Lumbar Three-Four Laminectomy and discectomy with Left Lumbar Three-Four, Bilateral Lumbar Four-Five Laminectomy for decompression;  Surgeon: Kevan Ny Ditty, MD;  Location: [facility];  Service: Neurosurgery;  Laterality: N/A;  . SPINAL CORD STIMULATOR INSERTION  12/09/2017   Procedure: LUMBAR SPINAL CORD STIMULATOR IMPLANT;  Surgeon: Eustace Moore, MD;  Location: [facility];  Service: Neurosurgery;;  . SPINAL CORD STIMULATOR REMOVAL N/A 01/11/2018   Procedure: LUMBAR SPINAL CORD STIMULATOR REMOVAL;  Surgeon: Eustace Moore, MD;  Location: [facility];  Service: Neurosurgery;  Laterality: N/A;  . TUBAL  LIGATION     Social History   Social History Narrative   Married.   3 children.   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    Immunization History  Administered Date(s) Administered  . Influenza,inj,Quad PF,6+ Mos 10/19/2016, 10/21/2017, 01/31/2019, 10/19/2019  . PFIZER SARS-COV-2 Vaccination 04/17/2020, 05/08/2020  . Pneumococcal Polysaccharide-23 10/27/2014, 06/25/2019  . Td 04/19/2016  . Zoster 10/27/2014     Objective: Vital Signs: BP (!) 156/89 (BP Location: Left Arm, Patient Position: Sitting, Cuff Size: Normal)   Pulse 66   Resp 15   Ht 5' 6"  (1.676 m)   Wt 212 lb 3.2 oz (96.3 kg)   BMI 34.25 kg/m    Physical Exam Vitals and nursing note reviewed.  Constitutional:      Appearance: She is well-developed.  HENT:     Head: Normocephalic and atraumatic.  Eyes:     Conjunctiva/sclera: Conjunctivae normal.  Pulmonary:     Effort: Pulmonary effort is normal.  Abdominal:     Palpations: Abdomen is soft.  Musculoskeletal:     Cervical back: Normal range of motion.  Skin:    General: Skin is warm and dry.     Capillary Refill: Capillary refill takes less than 2 seconds.  Neurological:     Mental  Status: She is alert and oriented to person, place, and time.  Psychiatric:        Behavior: Behavior normal.      Musculoskeletal Exam: Generalized hyperalgesia and positive tender points.  C-spine, thoracic spine, and lumbar spine painful and limited ROM.  Shoulder joints, elbow joints, wrist joints, MCPs, PIPs, and DIPs good ROM with no synovitis.  Complete fist formation bilaterally.  Knee joints good ROM with no warmth or effusion.  Ankle joints good ROM with no tenderness or inflammation.    CDAI Exam: CDAI Score: -- Patient Global: --; Provider Global: -- Swollen: --; Tender: -- Joint Exam 10/20/2020   No joint exam has been documented for this visit   There is currently no information documented on the homunculus. Go to the Rheumatology activity and complete the homunculus joint exam.  Investigation: No additional findings.  Imaging: No results found.  Recent Labs: Lab Results  Component Value Date   WBC 7.6 04/28/2020   HGB 14.5 04/28/2020   PLT 356 04/28/2020   NA 134 (L) 04/28/2020   K 4.1 04/28/2020   CL 95 (L) 04/28/2020   CO2 26 04/28/2020   GLUCOSE 265 (H) 04/28/2020   BUN 12 04/28/2020   CREATININE 0.85 04/28/2020   BILITOT 0.9 04/28/2020   ALKPHOS 101 03/02/2019   AST 19 04/28/2020   ALT 12 04/28/2020   PROT 8.0 04/28/2020   ALBUMIN 4.4 03/02/2019   CALCIUM 9.8 04/28/2020   GFRAA 89 04/28/2020    Speciality Comments: PLQ Eye Exam: 01/16/2019 WNL @ [facility]  Procedures:  No procedures performed Allergies: Pseudoephedrine hcl, Ancef [cefazolin], Clindamycin/lincomycin, Prednisone, Adhesive [tape], Other, and Sudafed [pseudoephedrine hcl]   Assessment / Plan:     Visit Diagnoses: Other organ or system involvement in systemic lupus erythematosus (HCC) - +La, oral ulcers, malar rash, fatigue, photosensitivity, Raynaud's, Subcutaneous lupus-bx+, hair loss, +RF, sicca symptoms: She has not had any signs or symptoms of a systemic lupus flare  recently.  She is clinically doing well on methotrexate 8 tablets by mouth once weekly, folic acid 2 mg a mouth daily, and Plaquenil 200 mg 1 tablet by mouth twice daily.  She has noticed some  increased discomfort in her right knee joint and right second MCP joint recently.  No synovitis was noted on examination today.  She continues to have recurrent oral ulcerations but has not had any nasal ulcerations.  She has persistent sicca symptoms and the use of over-the-counter products as well as a humidifier at night were discussed.  She has intermittent symptoms of Raynaud's but no digital stations or signs of gangrene were noted on examination today.  We discussed importance of avoiding triggers and continuing to take Plavix and Norvasc as prescribed.  Lab work from 02/01/20 was reviewed today in the office: ESR 34, dsDNA negative, and complements WNL  She is due to update autoimmune lab work today.  Orders were released.  We discussed reapplying for for Rasuvo 20 mg subcutaneous injections once weekly since she found injectable methotrexate to be more effective than oral methotrexate.  She previously had difficulty with vial and syringe methotrexate.  A sample of Rasuvo was provided today.  She has also been experiencing nausea and diarrhea for several days after taking oral methotrexate on a weekly basis.  She will continue taking Plaquenil as prescribed.  She was advised to notify us if she develops signs or symptoms of a flare.  She will follow up in 5 months.  - Plan: COMPLETE METABOLIC PANEL WITH GFR, CBC with Differential/Platelet, Urinalysis, Routine w reflex microscopic, Anti-DNA antibody, double-stranded, C3 and C4, Sedimentation rate, CBC with Differential/Platelet, COMPLETE METABOLIC PANEL WITH GFR  High risk medication use - PLQ 200 mg 1 tablet by mouth twice daily, methotrexate 0.8 ml sq injections once weekly, folic acid 59m po qd. PLQ Eye Exam: 01/16/2019.  She is scheduled for a PLQ eye exam on Friday.   - Plan: COMPLETE METABOLIC PANEL WITH GFR, CBC with Differential/Platelet, CBC with Differential/Platelet, COMPLETE METABOLIC PANEL WITH GFR CBC and CMP were drawn on 04/28/20.  She is overdue to update lab work.  Orders for CBC and CMP were released today.  Her next lab work will be due to January and every 3 months to monitor for drug toxicity.  She has not had any recent infections.  We discussed the importance of holding methotrexate if she develops signs or symptoms of an infection and to resume once the infection has completely cleared.  She has received both covid-19 vaccine doses and was encouraged to receive the 3rd dose.  She was advised to hold MTX 1 week after the 3rd dose.  She was advised to avoid tylenol and NSAIDs 24 prior to the 3rd dose.  She was advised to notify [place] her PCP if she develops the covid-19 infection in order to receive the monoclonal antibody infusion.  She voiced understanding.   Sjogren's syndrome with other organ involvement ([place] -She has persistent sicca symptoms.  She has been using Biotene products for symptomatic relief.  We discussed the importance of increasing her fluid intake.  She can also try Tylenol and over-the-counter.  She is no longer taking pilocarpine.  She will continue taking Plaquenil as prescribed.  Plan: COMPLETE METABOLIC PANEL WITH GFR, CBC with Differential/Platelet, Urinalysis, Routine w reflex microscopic  Recurrent oral ulcers: She has recurrent oral ulcerations but no nasal ulcerations recently.  She has been taking folic acid as prescribed.  Raynaud's syndrome without gangrene: She has intermittent symptoms of Raynaud's but no digital stations or signs of gangrene were noted on exam.  She will continue taking amlodipine and Plavix as prescribed.  She was advised to notify [place] she  develops any new or worsening symptoms.  We discussed the importance of avoiding triggers.  She was also encouraged to wear socks, gloves, and keep her core body  temperature warm.  Primary osteoarthritis of both hands: She has mild PIP and DIP thickening consistent with osteoarthritis of both hands.  No joint tenderness or inflammation was noted on exam.  She was able to make a complete fist bilaterally.  She has tenderness of the right CMC joint on examination today but no synovitis was noted.  Primary osteoarthritis of both knees - She had SynviscOne injections in bilateral knee joints on 03/03/2020.  She has persistent discomfort in the right knee joint.  She has good range of motion of her right knee with no warmth or effusion.  She continues to walk with a cane to assist with ambulation.  Trochanteric bursitis, left hip: She experiences intermittent discomfort.  S/P lumbar laminectomy: Chronic pain. She currently rates her pain a 8/10.  She has established care with Dr. Tally Joe.  She will require invasive surgery in the future but she is postpone surgical intervention at this time.  She continues to walk with a cane to assist with ambulation.   Fibromyalgia -she has generalized hyperalgesia and positive tender points on exam.  She takes Flexeril 10 mg by mouth at bedtime for muscle spasms.   Other medical conditions are listed as follows:   Diabetes mellitus without complication ([place])  History of chronic kidney disease  Essential hypertension  Mitral valve insufficiency, unspecified etiology  Former smoker  Orders: Orders Placed This Encounter  Procedures  . COMPLETE METABOLIC PANEL WITH GFR  . CBC with Differential/Platelet  . Urinalysis, Routine w reflex microscopic  . Anti-DNA antibody, double-stranded  . C3 and C4  . Sedimentation rate  . CBC with Differential/Platelet  . COMPLETE METABOLIC PANEL WITH GFR   No orders of the defined types were placed in this encounter.     Follow-Up Instructions: Return in about 5 months (around 03/20/2021) for Systemic lupus erythematosus, Rheumatoid arthritis.   Ofilia Neas, PA-C  Note  - This record has been created using Dragon software.  Chart creation errors have been sought, but may not always  have been located. Such creation errors do not reflect on  the standard of medical care.

## 2020-10-16 ENCOUNTER — Other Ambulatory Visit: Payer: Self-pay | Admitting: Primary Care

## 2020-10-16 DIAGNOSIS — E119 Type 2 diabetes mellitus without complications: Secondary | ICD-10-CM

## 2020-10-17 ENCOUNTER — Other Ambulatory Visit: Payer: Self-pay

## 2020-10-17 DIAGNOSIS — E119 Type 2 diabetes mellitus without complications: Secondary | ICD-10-CM

## 2020-10-17 MED ORDER — INSULIN GLARGINE 100 UNIT/ML SOLOSTAR PEN: 30.0000 [IU] | PEN_INJECTOR | Freq: Every day | SUBCUTANEOUS | 3 refills | Status: DC

## 2020-10-20 ENCOUNTER — Encounter: Payer: Self-pay | Admitting: Physician Assistant

## 2020-10-20 ENCOUNTER — Ambulatory Visit: Payer: Medicare Other | Admitting: Physician Assistant

## 2020-10-20 ENCOUNTER — Other Ambulatory Visit: Payer: Self-pay

## 2020-10-20 VITALS — BP 156/89 | HR 66 | Resp 15 | Ht 66.0 in | Wt 212.2 lb

## 2020-10-20 DIAGNOSIS — I7300 Raynaud's syndrome without gangrene: Secondary | ICD-10-CM

## 2020-10-20 DIAGNOSIS — Z87448 Personal history of other diseases of urinary system: Secondary | ICD-10-CM

## 2020-10-20 DIAGNOSIS — Z9889 Other specified postprocedural states: Secondary | ICD-10-CM

## 2020-10-20 DIAGNOSIS — M797 Fibromyalgia: Secondary | ICD-10-CM

## 2020-10-20 DIAGNOSIS — Z79899 Other long term (current) drug therapy: Secondary | ICD-10-CM | POA: Diagnosis not present

## 2020-10-20 DIAGNOSIS — K1379 Other lesions of oral mucosa: Secondary | ICD-10-CM

## 2020-10-20 DIAGNOSIS — M3509 Sicca syndrome with other organ involvement: Secondary | ICD-10-CM

## 2020-10-20 DIAGNOSIS — M7062 Trochanteric bursitis, left hip: Secondary | ICD-10-CM

## 2020-10-20 DIAGNOSIS — Z87891 Personal history of nicotine dependence: Secondary | ICD-10-CM

## 2020-10-20 DIAGNOSIS — M19042 Primary osteoarthritis, left hand: Secondary | ICD-10-CM

## 2020-10-20 DIAGNOSIS — I10 Essential (primary) hypertension: Secondary | ICD-10-CM

## 2020-10-20 DIAGNOSIS — M170 Bilateral primary osteoarthritis of knee: Secondary | ICD-10-CM

## 2020-10-20 DIAGNOSIS — M19041 Primary osteoarthritis, right hand: Secondary | ICD-10-CM

## 2020-10-20 DIAGNOSIS — M3219 Other organ or system involvement in systemic lupus erythematosus: Secondary | ICD-10-CM | POA: Diagnosis not present

## 2020-10-20 DIAGNOSIS — I340 Nonrheumatic mitral (valve) insufficiency: Secondary | ICD-10-CM

## 2020-10-20 DIAGNOSIS — E119 Type 2 diabetes mellitus without complications: Secondary | ICD-10-CM

## 2020-10-20 NOTE — Patient Instructions (Addendum)
COVID-19 vaccine recommendations:   COVID-19 vaccine is recommended for everyone (unless you are allergic to a vaccine component), even if you are on a medication that suppresses your immune system.   If you are on Methotrexate, Cellcept (mycophenolate), Rinvoq, Xeljanz, and Olumiant- hold the medication for 1 week after each vaccine. Hold Methotrexate for 2 weeks after the single dose COVID-19 vaccine.   If you are on Orencia subcutaneous injection - hold medication one week prior to and one week after the first COVID-19 vaccine dose (only).   If you are on Orencia IV infusions- time vaccination administration so that the first COVID-19 vaccination will occur four weeks after the infusion and postpone the subsequent infusion by one week.   If you are on Cyclophosphamide or Rituxan infusions please contact your doctor prior to receiving the COVID-19 vaccine.   Do not take Tylenol or any anti-inflammatory medications (NSAIDs) 24 hours prior to the COVID-19 vaccination.   There is no direct evidence about the efficacy of the COVID-19 vaccine in individuals who are on medications that suppress the immune system.   Even if you are fully vaccinated, and you are on any medications that suppress your immune system, please continue to wear a mask, maintain at least six feet social distance and practice hand hygiene.   If you develop a COVID-19 infection, please contact your PCP or our office to determine if you need antibody infusion.  The booster vaccine is now available for immunocompromised patients. It is advised that if you had Pfizer vaccine you should get Pfizer booster.  If you had a Moderna vaccine then you should get a Moderna booster. Johnson and Johnson does not have a booster vaccine at this time.  Please see the following web sites for updated information.    https://[email or web address]/[email or web address]/0/Files/COVID-19-Vaccination-Patient-Resources.[email or web address]  [email or web address]://[email or web address]/About-Us/[email or web address]-Releases/ID/1159  Standing Labs We placed an order today for your standing lab work.   Please have your standing labs drawn in January and every 3 months   If possible, please have your labs drawn 2 weeks prior to your appointment so that the provider can discuss your results at your appointment.  We have open lab daily Monday through Thursday from 8:30-12:30 PM and 1:30-4:30 PM and Friday from 8:30-12:30 PM and 1:30-4:00 PM at the office of Dr. Shaili Deveshwar, [facility].   Please be advised, patients with office appointments requiring lab work will take precedents over walk-in lab work.  If possible, please come for your lab work on Monday and Friday afternoons, as you may experience shorter wait times. The office is located at [address], [address], [place], [place] [address] No appointment is necessary.   Labs are drawn by [facility]. Please bring your co-pay at the time of your lab draw.  You may receive a bill from [facility] for your lab work.  If you wish to have your labs drawn at another location, please call the office 24 hours in advance to send orders.  If you have any questions regarding directions or hours of operation,  please call [phone].   As a reminder, please drink plenty of water prior to coming for your lab work. Thanks!   

## 2020-10-20 NOTE — Progress Notes (Signed)
Medication Samples have been provided to the patient.  Drug name: Rasuvo    Strength: 20mg     Qty: 1 LOT: [id number]  Exp.Date: 03/2021  Dosing instructions: Inject 20mg  into the skin once weekly.   The patient has been instructed regarding the correct time, dose, and frequency of taking this medication, including desired effects and most common side effects.   Earnestine Mealing 2:46 PM 10/20/2020

## 2020-10-21 ENCOUNTER — Telehealth: Payer: Self-pay

## 2020-10-21 LAB — COMPLETE METABOLIC PANEL WITH GFR
AG Ratio: 1.2 (calc) (ref 1.0–2.5)
ALT: 8 U/L (ref 6–29)
AST: 12 U/L (ref 10–35)
Albumin: 4 g/dL (ref 3.6–5.1)
Alkaline phosphatase (APISO): 93 U/L (ref 37–153)
BUN: 9 mg/dL (ref 7–25)
CO2: 29 mmol/L (ref 20–32)
Calcium: 9.3 mg/dL (ref 8.6–10.4)
Chloride: 104 mmol/L (ref 98–110)
Creat: 0.75 mg/dL (ref 0.50–1.05)
GFR, Est African American: 103 mL/min/{1.73_m2} (ref >=60)
GFR, Est Non African American: 89 mL/min/{1.73_m2} (ref >=60)
Globulin: 3.3 g/dL (calc) (ref 1.9–3.7)
Glucose, Bld: 249 mg/dL — ABNORMAL HIGH (ref 65–99)
Potassium: 4.3 mmol/L (ref 3.5–5.3)
Sodium: 137 mmol/L (ref 135–146)
Total Bilirubin: 0.4 mg/dL (ref 0.2–1.2)
Total Protein: 7.3 g/dL (ref 6.1–8.1)

## 2020-10-21 LAB — URINALYSIS, ROUTINE W REFLEX MICROSCOPIC
Bilirubin Urine: NEGATIVE
Hgb urine dipstick: NEGATIVE
Ketones, ur: NEGATIVE
Leukocytes,Ua: NEGATIVE
Nitrite: NEGATIVE
Protein, ur: NEGATIVE
Specific Gravity, Urine: 1.029 (ref 1.001–1.03)
pH: <=5 (ref 5.0–8.0)

## 2020-10-21 LAB — CBC WITH DIFFERENTIAL/PLATELET
Absolute Monocytes: 342 cells/uL (ref 200–950)
Basophils Absolute: 74 cells/uL (ref 0–200)
Basophils Relative: 1.1 %
Eosinophils Absolute: 235 cells/uL (ref 15–500)
Eosinophils Relative: 3.5 %
HCT: 38.1 % (ref 35.0–45.0)
Hemoglobin: 13.1 g/dL (ref 11.7–15.5)
Lymphs Abs: 1849 cells/uL (ref 850–3900)
MCH: 29.6 pg (ref 27.0–33.0)
MCHC: 34.4 g/dL (ref 32.0–36.0)
MCV: 86.2 fL (ref 80.0–100.0)
MPV: 9.8 fL (ref 7.5–12.5)
Monocytes Relative: 5.1 %
Neutro Abs: 4201 cells/uL (ref 1500–7800)
Neutrophils Relative %: 62.7 %
Platelets: 332 10*3/uL (ref 140–400)
RBC: 4.42 10*6/uL (ref 3.80–5.10)
RDW: 12 % (ref 11.0–15.0)
Total Lymphocyte: 27.6 %
WBC: 6.7 10*3/uL (ref 3.8–10.8)

## 2020-10-21 LAB — C3 AND C4
C3 Complement: 155 mg/dL (ref 83–193)
C4 Complement: 25 mg/dL (ref 15–57)

## 2020-10-21 LAB — SEDIMENTATION RATE: Sed Rate: 25 mm/h (ref 0–30)

## 2020-10-21 LAB — ANTI-DNA ANTIBODY, DOUBLE-STRANDED: ds DNA Ab: <1 IU/mL

## 2020-10-21 NOTE — Telephone Encounter (Signed)
Please apply for rasuvo or otrexup, per Hazel Sams, PA-C.   (patient has new insurance since the last time we applied). Thanks!

## 2020-10-21 NOTE — Telephone Encounter (Signed)
Did not pull any new insurance info on eligibility check.  Ran test claim, patient's copay for 1 month of Rasuvo is $150.00. Patient has an active PA on file that is valid through 12/26/20.  Authorization # [id number]  Patient can try applying for [employer] Patient Assistance, but we have had trouble when applying with diagnosis code outside of RA and psoriasis.   Patient declined applying for patient assistance in the past and went on to use vial and syringe.  Called patient to discuss, had to leave a message.

## 2020-10-21 NOTE — Progress Notes (Signed)
DsDNA is negative. Labs are not consistent with a SLE flare.

## 2020-10-21 NOTE — Progress Notes (Signed)
Glucose is elevated-249. Rest of CMP WNL.  CBC WNL. ESR WNL.  RF negative.  Complements WNL. UA: glucose 3+.  Please forward lab work to PCP.

## 2020-10-22 NOTE — Telephone Encounter (Signed)
Attempted to contact patient and left message for patient to call the office.  

## 2020-10-23 ENCOUNTER — Telehealth: Payer: Self-pay | Admitting: Primary Care

## 2020-10-23 NOTE — Telephone Encounter (Addendum)
-----   Message from Shona Needles, RT sent at 10/22/2020 10:28 AM EDT ----- Please review mutual patient's lab results. Thank you.   Joellen, hyperglycemia on recent labs.  What are her blood sugars running and how much insulin is she injecting? I would like to have her A1C repeated on or after November 23rd. Lab appointment.

## 2020-10-24 DIAGNOSIS — M329 Systemic lupus erythematosus, unspecified: Secondary | ICD-10-CM | POA: Diagnosis not present

## 2020-10-24 DIAGNOSIS — Z79899 Other long term (current) drug therapy: Secondary | ICD-10-CM | POA: Diagnosis not present

## 2020-10-24 LAB — HM DIABETES EYE EXAM

## 2020-10-24 NOTE — Telephone Encounter (Signed)
Left message to return call to our office.  

## 2020-10-28 NOTE — Telephone Encounter (Signed)
Left message to return call to our office.  

## 2020-10-28 NOTE — Telephone Encounter (Signed)
Attempted to contact the patient and left message for patient to call the office.  

## 2020-10-28 NOTE — Telephone Encounter (Signed)
Will send MyChart message

## 2020-10-30 ENCOUNTER — Encounter: Payer: Self-pay | Admitting: Primary Care

## 2020-11-17 DIAGNOSIS — Z794 Long term (current) use of insulin: Secondary | ICD-10-CM | POA: Diagnosis not present

## 2020-11-17 DIAGNOSIS — Z823 Family history of stroke: Secondary | ICD-10-CM | POA: Diagnosis not present

## 2020-11-17 DIAGNOSIS — Z560 Unemployment, unspecified: Secondary | ICD-10-CM | POA: Diagnosis not present

## 2020-11-17 DIAGNOSIS — E1142 Type 2 diabetes mellitus with diabetic polyneuropathy: Secondary | ICD-10-CM | POA: Diagnosis not present

## 2020-11-17 DIAGNOSIS — E1122 Type 2 diabetes mellitus with diabetic chronic kidney disease: Secondary | ICD-10-CM | POA: Diagnosis not present

## 2020-11-17 DIAGNOSIS — E785 Hyperlipidemia, unspecified: Secondary | ICD-10-CM | POA: Diagnosis not present

## 2020-11-17 DIAGNOSIS — Z800 Family history of malignant neoplasm of digestive organs: Secondary | ICD-10-CM | POA: Diagnosis not present

## 2020-11-17 DIAGNOSIS — M797 Fibromyalgia: Secondary | ICD-10-CM | POA: Diagnosis not present

## 2020-11-17 DIAGNOSIS — R221 Localized swelling, mass and lump, neck: Secondary | ICD-10-CM | POA: Diagnosis not present

## 2020-11-17 DIAGNOSIS — M069 Rheumatoid arthritis, unspecified: Secondary | ICD-10-CM | POA: Diagnosis not present

## 2020-11-17 DIAGNOSIS — D472 Monoclonal gammopathy: Secondary | ICD-10-CM | POA: Diagnosis not present

## 2020-11-17 DIAGNOSIS — G8929 Other chronic pain: Secondary | ICD-10-CM | POA: Diagnosis not present

## 2020-11-17 DIAGNOSIS — M1991 Primary osteoarthritis, unspecified site: Secondary | ICD-10-CM | POA: Diagnosis not present

## 2020-11-17 DIAGNOSIS — Z8673 Personal history of transient ischemic attack (TIA), and cerebral infarction without residual deficits: Secondary | ICD-10-CM | POA: Diagnosis not present

## 2020-11-17 DIAGNOSIS — Z8249 Family history of ischemic heart disease and other diseases of the circulatory system: Secondary | ICD-10-CM | POA: Diagnosis not present

## 2020-11-17 DIAGNOSIS — E1165 Type 2 diabetes mellitus with hyperglycemia: Secondary | ICD-10-CM | POA: Diagnosis not present

## 2020-11-17 DIAGNOSIS — M545 Low back pain, unspecified: Secondary | ICD-10-CM | POA: Diagnosis not present

## 2020-11-17 DIAGNOSIS — M329 Systemic lupus erythematosus, unspecified: Secondary | ICD-10-CM | POA: Diagnosis not present

## 2020-11-17 DIAGNOSIS — Z881 Allergy status to other antibiotic agents status: Secondary | ICD-10-CM | POA: Diagnosis not present

## 2020-11-17 DIAGNOSIS — Z87891 Personal history of nicotine dependence: Secondary | ICD-10-CM | POA: Diagnosis not present

## 2020-11-17 DIAGNOSIS — N189 Chronic kidney disease, unspecified: Secondary | ICD-10-CM | POA: Diagnosis not present

## 2020-11-17 DIAGNOSIS — Z981 Arthrodesis status: Secondary | ICD-10-CM | POA: Diagnosis not present

## 2020-11-17 DIAGNOSIS — I129 Hypertensive chronic kidney disease with stage 1 through stage 4 chronic kidney disease, or unspecified chronic kidney disease: Secondary | ICD-10-CM | POA: Diagnosis not present

## 2020-11-17 DIAGNOSIS — Z8262 Family history of osteoporosis: Secondary | ICD-10-CM | POA: Diagnosis not present

## 2020-11-18 ENCOUNTER — Other Ambulatory Visit: Payer: Self-pay | Admitting: Physician Assistant

## 2020-11-18 NOTE — Telephone Encounter (Signed)
Last Visit: 10/20/2020 Next Visit: 03/19/2021 Labs: 10/20/2020 Glucose is elevated-249. Rest of CMP WNL. CBC WNL.   Current Dose per office note 10/20/2020: methotrexate 8 tablets by mouth once weekly DX: Other organ or system involvement in systemic lupus erythematosus   Okay to refill per Dr. Estanislado Pandy

## 2020-12-03 ENCOUNTER — Other Ambulatory Visit: Payer: Self-pay | Admitting: Primary Care

## 2020-12-03 DIAGNOSIS — M545 Low back pain, unspecified: Secondary | ICD-10-CM

## 2020-12-03 DIAGNOSIS — G8929 Other chronic pain: Secondary | ICD-10-CM

## 2020-12-04 NOTE — Telephone Encounter (Signed)
LAST APPOINTMENT DATE: 08/18/2020   NEXT APPOINTMENT DATE: 02/18/2021    LAST REFILL: 06/06/2020  QTY: #90 with 1 rf

## 2020-12-04 NOTE — Telephone Encounter (Signed)
Refills sent to pharmacy. 

## 2021-01-13 NOTE — Telephone Encounter (Signed)
Lauren Winters, can we add her virtually for today or tomorrow?

## 2021-01-14 ENCOUNTER — Other Ambulatory Visit: Payer: Self-pay

## 2021-01-14 ENCOUNTER — Ambulatory Visit
Admission: RE | Admit: 2021-01-14 | Discharge: 2021-01-14 | Disposition: A | Discharge status: Home / Self Care | Payer: Medicare Other | Source: Home / Self Care | Attending: Primary Care | Admitting: Primary Care

## 2021-01-14 ENCOUNTER — Ambulatory Visit
Admission: RE | Admit: 2021-01-14 | Discharge: 2021-01-14 | Disposition: A | Discharge status: Home / Self Care | Payer: Medicare Other | Source: Ambulatory Visit | Attending: Primary Care | Admitting: Primary Care

## 2021-01-14 ENCOUNTER — Encounter: Payer: Self-pay | Admitting: Primary Care

## 2021-01-14 ENCOUNTER — Ambulatory Visit
Admission: RE | Admit: 2021-01-14 | Discharge: 2021-01-14 | Disposition: A | Discharge status: Home / Self Care | Payer: Medicare Other | Attending: Primary Care | Admitting: Primary Care

## 2021-01-14 ENCOUNTER — Telehealth (INDEPENDENT_AMBULATORY_CARE_PROVIDER_SITE_OTHER): Payer: Medicare Other | Admitting: Primary Care

## 2021-01-14 ENCOUNTER — Other Ambulatory Visit (INDEPENDENT_AMBULATORY_CARE_PROVIDER_SITE_OTHER): Payer: Medicare Other

## 2021-01-14 VITALS — Ht 66.0 in | Wt 212.0 lb

## 2021-01-14 DIAGNOSIS — R059 Cough, unspecified: Secondary | ICD-10-CM

## 2021-01-14 DIAGNOSIS — R071 Chest pain on breathing: Secondary | ICD-10-CM | POA: Diagnosis not present

## 2021-01-14 DIAGNOSIS — R0600 Dyspnea, unspecified: Secondary | ICD-10-CM | POA: Diagnosis not present

## 2021-01-14 MED ORDER — HYDROCOD POLST-CPM POLST ER 10-8 MG/5ML PO SUER: 5.0000 mL | Freq: Two times a day (BID) | ORAL | 0 refills | Status: DC | PRN

## 2021-01-14 NOTE — Assessment & Plan Note (Addendum)
Viral symptoms suspicious of Covid-19 13 days ago, continued cough. Symptoms are worrisome given her extensive medical history. She will come by for PCR Covid-19 testing today. Will also obtain chest xray.  Rx for Tussionex sent to pharmacy. Will obtain lab work. Need to keep PE's on differentials list given chest pain. She has no history of PE or DVT. Quit smoking several years ago.  Await results. She does appear stable via video, we discussed hospital precautions. She will also keep me updated.

## 2021-01-14 NOTE — Addendum Note (Signed)
Addended by: Pleas Koch on: 01/14/2021 10:05 AM   Modules accepted: Orders

## 2021-01-14 NOTE — Telephone Encounter (Signed)
Was able to call patient and get seen today. No other action needed.

## 2021-01-14 NOTE — Patient Instructions (Signed)
Come by this afternoon for your Covid-19 test.  You may take the cough suppressant every 12 hours as needed for cough and rest. Caution this medication contains codeine which may cause drowsiness.   Go for your chest xray today as discussed.  Please update me if your symptoms progress.  It was a pleasure to see you today! Allie Bossier, NP-C

## 2021-01-14 NOTE — Progress Notes (Addendum)
Subjective:    Patient ID: Lauren Winters, female    DOB: [birth year], 55 y.o.   MRN: [id number]  HPI  Virtual Visit via Video Note  I connected with Lauren Winters on 01/14/21 at  9:20 AM EST by a video enabled telemedicine application and verified that I am speaking with the correct person using two identifiers.  Location: Patient: Home Provider: Office Participants: Patient and myself   I discussed the limitations of evaluation and management by telemedicine and the availability of in person appointments. The patient expressed understanding and agreed to proceed.  History of Present Illness:  Ms. Hammac is a 55 year old female with a significant medical history including type 2 diabetes, hypertension, chronic back pain, chronic hand pain, anxiety, fibromyalgia, rheumatoid arthritis, CVA who presents today with a chief complaint of cough.  Symptoms began on Friday January 6th with cough, fevers, chills. She ran a fever for a few days, no fever in over a week. Symptoms now include dry cough, chest pain, headache, head congestion. Chest pain occurs with cough, rest, movement. Also with some shortness of breath.   She completed a rapid home Covid-19 test one week ago, tested positive. She took another rapid Covid-19 test last night which was negative.   She's been taking Tylenol only, nothing else OTC for symptoms.    Observations/Objective:  Alert and oriented. Appears tired, acutely ill. No distress. Speaking in complete sentences. Persistent dry cough during visit.  Assessment and Plan:  Viral symptoms suspicious of Covid-19 13 days ago, continued cough. Symptoms are worrisome given her extensive medical history. She will come by for PCR Covid-19 testing today. Will also obtain chest xray.  Rx for Tussionex sent to pharmacy. Will obtain lab work. Need to keep PE's on differentials list given chest pain. She has no history of PE or DVT. Quit smoking several years  ago.  Await results. She does appear stable via video, we discussed hospital precautions. She will also keep me updated.   Follow Up Instructions:  Come by this afternoon for your Covid-19 test.  You may take the cough suppressant every 12 hours as needed for cough and rest. Caution this medication contains codeine which may cause drowsiness.   Go for your chest xray today as discussed.  Please update me if your symptoms progress.  It was a pleasure to see you today! Allie Bossier, NP-C    I discussed the assessment and treatment plan with the patient. The patient was provided an opportunity to ask questions and all were answered. The patient agreed with the plan and demonstrated an understanding of the instructions.   The patient was advised to call back or seek an in-person evaluation if the symptoms worsen or if the condition fails to improve as anticipated.    Pleas Koch, NP    Review of Systems  Constitutional: Positive for fatigue. Negative for chills and fever.  HENT: Positive for congestion.   Respiratory: Positive for cough and shortness of breath.   Cardiovascular: Positive for chest pain.  Neurological: Positive for headaches.       Past Medical History:  Diagnosis Date  . Anxiety   . Atypical chest pain    a. ETT 05/2014: normal stress test without evidence of myocardial ischemia or chest pain at peak stress  . Complication of anesthesia    migraine headache  . Depression   . Diabetes mellitus without complication ([place]) metformin   dx 2015  . Dyspnea  on exertion  . Fibromyalgia   . Headache   . Heart murmur    told back in 2007, but no mention with 2016 cardiac w/u  . Hypertension   . Lupus (systemic lupus erythematosus) ([place])   . Mitral regurgitation    a. 05/2014 echo: EF >55%, nl LV/RV sys fxn, mild MR/AR, no valvular stenosis   . Obesity (BMI 30-39.9)   . Raynaud's phenomenon   . Rheumatoid arthritis ([place])   . S/P lumbar laminectomy  12/09/2017  . S/P lumbar spinal fusion 05/26/2018  . Sjogren's disease ([place])   . Spondylolisthesis of lumbar region   . Tobacco abuse    a. ongoing; b. ongoing SOB  . Wound infection after surgery 01/11/2018     Social History   Socioeconomic History  . Marital status: Married    Spouse name: Not on file  . Number of children: Not on file  . Years of education: Not on file  . Highest education level: Not on file  Occupational History  . Not on file  Tobacco Use  . Smoking status: Former Smoker    Packs/day: 0.50    Years: 10.00    Pack years: 5.00    Types: Cigarettes    Quit date: 07/22/2015    Years since quitting: 5.4  . Smokeless tobacco: Never Used  Vaping Use  . Vaping Use: Never used  Substance and Sexual Activity  . Alcohol use: No    Alcohol/week: 0.0 standard drinks  . Drug use: No  . Sexual activity: Not Currently  Other Topics Concern  . Not on file  Social History Narrative   Married.   3 children.   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    Social Determinants of Health   Financial Resource Strain: Not on file  Food Insecurity: Not on file  Transportation Needs: Not on file  Physical Activity: Not on file  Stress: Not on file  Social Connections: Not on file  Intimate Partner Violence: Not on file    Past Surgical History:  Procedure Laterality Date  . ABDOMINAL HYSTERECTOMY    . BACK SURGERY    . BONE MARROW BIOPSY  10/2019  . CESAREAN SECTION     x3  . LUMBAR LAMINECTOMY    . LUMBAR LAMINECTOMY/DECOMPRESSION MICRODISCECTOMY N/A 03/29/2017   Procedure: Re operative Right Lumbar Three-Four Laminectomy and discectomy with Left Lumbar Three-Four, Bilateral Lumbar Four-Five Laminectomy for decompression;  Surgeon: Kevan Ny Ditty, MD;  Location: [facility];  Service: Neurosurgery;  Laterality: N/A;  . SPINAL CORD STIMULATOR INSERTION  12/09/2017   Procedure: LUMBAR SPINAL CORD STIMULATOR IMPLANT;  Surgeon:  Eustace Moore, MD;  Location: [place] [facility];  Service: Neurosurgery;;  . SPINAL CORD STIMULATOR REMOVAL N/A 01/11/2018   Procedure: LUMBAR SPINAL CORD STIMULATOR REMOVAL;  Surgeon: Eustace Moore, MD;  Location: [facility];  Service: Neurosurgery;  Laterality: N/A;  . TUBAL LIGATION      Family History  Problem Relation Age of Onset  . Hypertension Mother   . Valvular heart disease Mother   . CAD Father 36       CABG x 4  . Liver cancer Father 22       passed  . CAD Paternal Aunt        CABG  . Breast cancer Paternal Aunt   . CAD Paternal Aunt        CABG  . Breast cancer Paternal Aunt   . CAD Paternal  Aunt        CABG  . Breast cancer Paternal Aunt   . CAD Paternal Aunt        CABG  . Breast cancer Paternal Aunt   . CAD Paternal Aunt        CABG  . Breast cancer Paternal Aunt   . CAD Paternal Uncle        MI s/p CABG  . Stroke Maternal Grandmother   . Stroke Maternal Grandfather   . CAD Maternal Grandfather     Allergies  Allergen Reactions  . Pseudoephedrine Hcl Shortness Of Breath and Other (See Comments)    Room starts spinning, can't breath  . Ancef [Cefazolin] Other (See Comments)    Unknown  . Clindamycin/Lincomycin Hives  . Prednisone Other (See Comments)    Chest pain w/ cortisone shots or prednisone pills Elevated blood pressure  . Adhesive [Tape] Rash and Other (See Comments)    Sensitivity to certain band aids and adhesives - burns skin, makes skin raw  . Other Rash and Other (See Comments)    Sensitivity to certain band aids and adhesives - burns skin, makes skin raw  . Sudafed [Pseudoephedrine Hcl] Other (See Comments)    Dizziness     Current Outpatient Medications on File Prior to Visit  Medication Sig Dispense Refill  . amLODipine (NORVASC) 10 MG tablet TAKE 1 TABLET ONCE DAILY FOR BLOOD PRESSURE. 90 tablet 1  . Blood Glucose Monitoring Suppl (ONETOUCH VERIO FLEX SYSTEM) w/Device KIT Use as instructed to test blood sugar 3 times daily 1 kit 0  . buPROPion  (WELLBUTRIN XL) 150 MG 24 hr tablet TAKE 1 TABLET BY MOUTH ONCE DAILY FOR ANXIETY AND DEPRESSION. 90 tablet 1  . clopidogrel (PLAVIX) 75 MG tablet TAKE 1 TABLET BY MOUTH EVERY DAY 90 tablet 3  . Continuous Blood Gluc Receiver (FREESTYLE LIBRE 14 DAY READER) DEVI 1 Device by Does not apply route in the morning, at noon, and at bedtime. 1 each 0  . Continuous Blood Gluc Sensor (FREESTYLE LIBRE 14 DAY SENSOR) MISC 1 Device by Does not apply route in the morning, at noon, and at bedtime. 2 each 3  . cyclobenzaprine (FLEXERIL) 10 MG tablet TAKE 1 TABLET BY MOUTH AT BEDTIME AS NEEDED FOR MUSCLE SPASMS 90 tablet 0  . escitalopram (LEXAPRO) 20 MG tablet Take 1 tablet (20 mg total) by mouth daily. For anxiety and depression. 90 tablet 1  . folic acid (FOLVITE) 1 MG tablet Take 2 tablets by mouth daily. 180 tablet 1  . glucose blood test strip Use as instructed to blood sugar 3 times daily 300 each 2  . hydroxychloroquine (PLAQUENIL) 200 MG tablet TAKE 1 TABLET BY MOUTH TWICE A DAY 180 tablet 0  . insulin glargine (LANTUS) 100 UNIT/ML Solostar Pen Inject 30 Units into the skin at bedtime. 15 mL 3  . Insulin Pen Needle (PEN NEEDLES) 31G X 6 MM MISC Use with insulin as directed. 100 each 0  . metFORMIN (GLUCOPHAGE-XR) 500 MG 24 hr tablet TAKE 1 TABLET EVERY MORNING WITH FOOD FOR DIABETES. 90 tablet 1  . methotrexate (RHEUMATREX) 2.5 MG tablet TAKE 8 TABLETS (20 MG TOTAL) BY MOUTH ONCE A WEEK. CAUTION:CHEMOTHERAPY. PROTECT FROM LIGHT. 32 tablet 2  . metoprolol succinate (TOPROL-XL) 50 MG 24 hr tablet Take 1 tablet (50 mg total) by mouth daily. For blood pressure 90 tablet 3  . rosuvastatin (CRESTOR) 10 MG tablet Take 1 tablet (10 mg total) by mouth every evening. For  cholesterol. 90 tablet 3  . spironolactone (ALDACTONE) 25 MG tablet Take 25 mg by mouth daily.    Marland Kitchen telmisartan-hydrochlorothiazide (MICARDIS HCT) 80-25 MG tablet Take by mouth.     No current facility-administered medications on file prior to  visit.    Ht _0  (1.676 m)   Wt 212 lb (96.2 kg)   BMI 34.22 kg/m    Objective:   Physical Exam Constitutional:      General: She is not in acute distress.    Appearance: She is ill-appearing.  Pulmonary:     Effort: Pulmonary effort is normal.     Comments: Persistent dry cough during visit Neurological:     Mental Status: She is alert and oriented to person, place, and time.  Psychiatric:        Mood and Affect: Mood normal.            Assessment & Plan:

## 2021-01-15 ENCOUNTER — Other Ambulatory Visit: Payer: Self-pay | Admitting: Primary Care

## 2021-01-15 ENCOUNTER — Telehealth: Payer: Self-pay | Admitting: Radiology

## 2021-01-15 DIAGNOSIS — R071 Chest pain on breathing: Secondary | ICD-10-CM

## 2021-01-15 LAB — BASIC METABOLIC PANEL
BUN: 9 mg/dL (ref 6–23)
CO2: 30 mEq/L (ref 19–32)
Calcium: 9.8 mg/dL (ref 8.4–10.5)
Chloride: 97 mEq/L (ref 96–112)
Creatinine, Ser: 0.83 mg/dL (ref 0.40–1.20)
GFR: 78.56 mL/min (ref >60.00)
Glucose, Bld: 226 mg/dL — ABNORMAL HIGH (ref 70–99)
Potassium: 3.7 mEq/L (ref 3.5–5.1)
Sodium: 134 mEq/L — ABNORMAL LOW (ref 135–145)

## 2021-01-15 LAB — CBC WITH DIFFERENTIAL/PLATELET
Basophils Absolute: 0 10*3/uL (ref 0.0–0.1)
Basophils Relative: 0.6 % (ref 0.0–3.0)
Eosinophils Absolute: 0.1 10*3/uL (ref 0.0–0.7)
Eosinophils Relative: 1.9 % (ref 0.0–5.0)
HCT: 38 % (ref 36.0–46.0)
Hemoglobin: 12.9 g/dL (ref 12.0–15.0)
Lymphocytes Relative: 26.5 % (ref 12.0–46.0)
Lymphs Abs: 1.6 10*3/uL (ref 0.7–4.0)
MCHC: 34 g/dL (ref 30.0–36.0)
MCV: 84 fl (ref 78.0–100.0)
Monocytes Absolute: 0.4 10*3/uL (ref 0.1–1.0)
Monocytes Relative: 7 % (ref 3.0–12.0)
Neutro Abs: 3.9 10*3/uL (ref 1.4–7.7)
Neutrophils Relative %: 64 % (ref 43.0–77.0)
Platelets: 310 10*3/uL (ref 150.0–400.0)
RBC: 4.52 Mil/uL (ref 3.87–5.11)
RDW: 13.3 % (ref 11.5–15.5)
WBC: 6.1 10*3/uL (ref 4.0–10.5)

## 2021-01-15 LAB — D-DIMER, QUANTITATIVE: D-Dimer, Quant: 0.51 mcg/mL FEU — ABNORMAL HIGH (ref <0.50)

## 2021-01-15 NOTE — Telephone Encounter (Signed)
[facility] called a critical D-Dimer, 0.51. Results given to Allie Bossier.

## 2021-01-16 ENCOUNTER — Other Ambulatory Visit: Payer: Self-pay | Admitting: Primary Care

## 2021-01-16 ENCOUNTER — Other Ambulatory Visit: Payer: Self-pay

## 2021-01-16 ENCOUNTER — Ambulatory Visit
Admission: RE | Admit: 2021-01-16 | Discharge: 2021-01-16 | Disposition: A | Discharge status: Home / Self Care | Payer: Medicare Other | Source: Ambulatory Visit | Attending: Primary Care | Admitting: Primary Care

## 2021-01-16 DIAGNOSIS — J439 Emphysema, unspecified: Secondary | ICD-10-CM

## 2021-01-16 DIAGNOSIS — R079 Chest pain, unspecified: Secondary | ICD-10-CM | POA: Diagnosis not present

## 2021-01-16 DIAGNOSIS — R071 Chest pain on breathing: Secondary | ICD-10-CM | POA: Insufficient documentation

## 2021-01-16 DIAGNOSIS — R059 Cough, unspecified: Secondary | ICD-10-CM | POA: Diagnosis not present

## 2021-01-16 DIAGNOSIS — U071 COVID-19: Secondary | ICD-10-CM | POA: Diagnosis not present

## 2021-01-16 LAB — SARS-COV-2, NAA 2 DAY TAT

## 2021-01-16 LAB — SPECIMEN STATUS REPORT

## 2021-01-16 LAB — NOVEL CORONAVIRUS, NAA: SARS-CoV-2, NAA: NOT DETECTED

## 2021-01-16 MED ORDER — IOHEXOL 350 MG/ML SOLN
75.0000 mL | Freq: Once | INTRAVENOUS | Status: AC | PRN
  Administered 2021-01-16: 75 mL via INTRAVENOUS

## 2021-01-16 MED ORDER — ALBUTEROL SULFATE HFA 108 (90 BASE) MCG/ACT IN AERS: 2.0000 | INHALATION_SPRAY | RESPIRATORY_TRACT | 0 refills | Status: DC | PRN

## 2021-01-17 NOTE — Telephone Encounter (Signed)
Please advise will send message about the covid results meaning that she does not currently have covid.

## 2021-01-19 DIAGNOSIS — R059 Cough, unspecified: Secondary | ICD-10-CM

## 2021-01-20 ENCOUNTER — Ambulatory Visit: Payer: Medicare Other

## 2021-01-21 MED ORDER — BENZONATATE 200 MG PO CAPS: 200.0000 mg | ORAL_CAPSULE | Freq: Three times a day (TID) | ORAL | 0 refills | Status: DC | PRN

## 2021-02-17 ENCOUNTER — Other Ambulatory Visit: Payer: Self-pay

## 2021-02-17 ENCOUNTER — Encounter: Payer: Self-pay | Admitting: Primary Care

## 2021-02-17 ENCOUNTER — Ambulatory Visit (INDEPENDENT_AMBULATORY_CARE_PROVIDER_SITE_OTHER): Payer: Medicare Other | Admitting: Primary Care

## 2021-02-17 VITALS — BP 134/82 | HR 100 | Temp 97.6°F | Ht 66.0 in | Wt 211.0 lb

## 2021-02-17 DIAGNOSIS — E785 Hyperlipidemia, unspecified: Secondary | ICD-10-CM | POA: Diagnosis not present

## 2021-02-17 DIAGNOSIS — E119 Type 2 diabetes mellitus without complications: Secondary | ICD-10-CM | POA: Diagnosis not present

## 2021-02-17 DIAGNOSIS — R079 Chest pain, unspecified: Secondary | ICD-10-CM | POA: Diagnosis not present

## 2021-02-17 DIAGNOSIS — Z1231 Encounter for screening mammogram for malignant neoplasm of breast: Secondary | ICD-10-CM

## 2021-02-17 LAB — TSH: TSH: 0.99 u[IU]/mL (ref 0.35–4.50)

## 2021-02-17 LAB — BASIC METABOLIC PANEL
BUN: 13 mg/dL (ref 6–23)
CO2: 28 mEq/L (ref 19–32)
Calcium: 9.6 mg/dL (ref 8.4–10.5)
Chloride: 97 mEq/L (ref 96–112)
Creatinine, Ser: 0.74 mg/dL (ref 0.40–1.20)
GFR: 90.11 mL/min (ref >60.00)
Glucose, Bld: 228 mg/dL — ABNORMAL HIGH (ref 70–99)
Potassium: 4 mEq/L (ref 3.5–5.1)
Sodium: 136 mEq/L (ref 135–145)

## 2021-02-17 LAB — LIPID PANEL
Cholesterol: 182 mg/dL (ref 0–200)
HDL: 43.6 mg/dL (ref >39.00)
NonHDL: 138.38
Total CHOL/HDL Ratio: 4
Triglycerides: 219 mg/dL — ABNORMAL HIGH (ref 0.0–149.0)
VLDL: 43.8 mg/dL — ABNORMAL HIGH (ref 0.0–40.0)

## 2021-02-17 LAB — HEMOGLOBIN A1C: Hgb A1c MFr Bld: 9.4 % — ABNORMAL HIGH (ref 4.6–6.5)

## 2021-02-17 LAB — CBC
HCT: 39.9 % (ref 36.0–46.0)
Hemoglobin: 13.9 g/dL (ref 12.0–15.0)
MCHC: 34.8 g/dL (ref 30.0–36.0)
MCV: 84.1 fl (ref 78.0–100.0)
Platelets: 350 10*3/uL (ref 150.0–400.0)
RBC: 4.74 Mil/uL (ref 3.87–5.11)
RDW: 13.1 % (ref 11.5–15.5)
WBC: 6.6 10*3/uL (ref 4.0–10.5)

## 2021-02-17 LAB — LDL CHOLESTEROL, DIRECT: Direct LDL: 103 mg/dL

## 2021-02-17 NOTE — Progress Notes (Signed)
Subjective:    Patient ID: Lauren Winters, female    DOB: Jun 22, 1966, 55 y.o.   MRN: [id number]  HPI  This visit occurred during the SARS-CoV-2 public health emergency.  Safety protocols were in place, including screening questions prior to the visit, additional usage of staff PPE, and extensive cleaning of exam room while observing appropriate contact time as indicated for disinfecting solutions.   Lauren Winters is a 55 year old female with a significant medical history including type 2 diabetes, hypertension, chronic back pain, sjogren's syndrome, SLE, depression, CVA, paresthesias who presents today with a chief complaint of chest pain.  Located to the mid substernal region of her anterior chest which began around Thanksgiving 2021. She initially noticed the pain with rest, intermittently occurring 1-2 times weekly, felt like a pressure. Since then she's noticed a gradual increase in symptoms for which are now occurring every 1-2 days, and one week ago the chest pain woke her from sleep.   Symptoms will occur with exertion and rest, does not improve with rest, pain will ease up in about 15 minutes regardless of rest or exertion. She has felt nauseated and lightheaded at times with her pain.   She underwent CTA in January 2022 which was negative for PE. She's undergone echocardiogram and bilateral carotid US in 2019 and 2020, carotids with less than 50% stenosis, echo with EF of 60-65%, left ventricular hypertrophy, normal diastolic function.   She is compliant to her Lantus 35 units HS and Metformin XR 500 mg, but metformin causes a lot of GI upset. She is compliant to her rosuvastatin 10 mg.   BP Readings from Last 3 Encounters:  02/17/21 134/82  10/20/20 (!) 156/89  08/18/20 126/84     Review of Systems  Constitutional: Negative for fever.  Respiratory: Negative for shortness of breath.   Cardiovascular: Positive for chest pain. Negative for palpitations.  Gastrointestinal:  Positive for nausea.       Denies esophageal burning. GI upset with Metformin  Neurological: Positive for light-headedness.  Psychiatric/Behavioral: The patient is not nervous/anxious.        Past Medical History:  Diagnosis Date  . Anxiety   . Atypical chest pain    a. ETT 05/2014: normal stress test without evidence of myocardial ischemia or chest pain at peak stress  . Complication of anesthesia    migraine headache  . Depression   . Diabetes mellitus without complication ([place]) metformin   dx 2015  . Dyspnea    on exertion  . Fibromyalgia   . Headache   . Heart murmur    told back in 2007, but no mention with 2016 cardiac w/u  . Hypertension   . Lupus (systemic lupus erythematosus) ([place])   . Mitral regurgitation    a. 05/2014 echo: EF >55%, nl LV/RV sys fxn, mild MR/AR, no valvular stenosis   . Obesity (BMI 30-39.9)   . Raynaud's phenomenon   . Rheumatoid arthritis ([place])   . S/P lumbar laminectomy 12/09/2017  . S/P lumbar spinal fusion 05/26/2018  . Sjogren's disease ([place])   . Spondylolisthesis of lumbar region   . Tobacco abuse    a. ongoing; b. ongoing SOB  . Wound infection after surgery 01/11/2018     Social History   Socioeconomic History  . Marital status: Married    Spouse name: Not on file  . Number of children: Not on file  . Years of education: Not on file  . Highest education level: Not on  file  Occupational History  . Not on file  Tobacco Use  . Smoking status: Former Smoker    Packs/day: 0.50    Years: 10.00    Pack years: 5.00    Types: Cigarettes    Quit date: 07/22/2015    Years since quitting: 5.5  . Smokeless tobacco: Never Used  Vaping Use  . Vaping Use: Never used  Substance and Sexual Activity  . Alcohol use: No    Alcohol/week: 0.0 standard drinks  . Drug use: No  . Sexual activity: Not Currently  Other Topics Concern  . Not on file  Social History Narrative   Married.   3 children.   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    Social Determinants of Health   Financial Resource Strain: Not on file  Food Insecurity: Not on file  Transportation Needs: Not on file  Physical Activity: Not on file  Stress: Not on file  Social Connections: Not on file  Intimate Partner Violence: Not on file    Past Surgical History:  Procedure Laterality Date  . ABDOMINAL HYSTERECTOMY    . BACK SURGERY    . BONE MARROW BIOPSY  10/2019  . CESAREAN SECTION     x3  . LUMBAR LAMINECTOMY    . LUMBAR LAMINECTOMY/DECOMPRESSION MICRODISCECTOMY N/A 03/29/2017   Procedure: Re operative Right Lumbar Three-Four Laminectomy and discectomy with Left Lumbar Three-Four, Bilateral Lumbar Four-Five Laminectomy for decompression;  Surgeon: Kevan Ny Ditty, MD;  Location: [facility];  Service: Neurosurgery;  Laterality: N/A;  . SPINAL CORD STIMULATOR INSERTION  12/09/2017   Procedure: LUMBAR SPINAL CORD STIMULATOR IMPLANT;  Surgeon: Eustace Moore, MD;  Location: [facility];  Service: Neurosurgery;;  . SPINAL CORD STIMULATOR REMOVAL N/A 01/11/2018   Procedure: LUMBAR SPINAL CORD STIMULATOR REMOVAL;  Surgeon: Eustace Moore, MD;  Location: [facility];  Service: Neurosurgery;  Laterality: N/A;  . TUBAL LIGATION      Family History  Problem Relation Age of Onset  . Hypertension Mother   . Valvular heart disease Mother   . CAD Father 39       CABG x 4  . Liver cancer Father 55       passed  . CAD Paternal Aunt        CABG  . Breast cancer Paternal Aunt   . CAD Paternal Aunt        CABG  . Breast cancer Paternal Aunt   . CAD Paternal Aunt        CABG  . Breast cancer Paternal Aunt   . CAD Paternal Aunt        CABG  . Breast cancer Paternal Aunt   . CAD Paternal Aunt        CABG  . Breast cancer Paternal Aunt   . CAD Paternal Uncle        MI s/p CABG  . Stroke Maternal Grandmother   . Stroke Maternal Grandfather   . CAD Maternal Grandfather     Allergies  Allergen Reactions  . Pseudoephedrine Hcl  Shortness Of Breath and Other (See Comments)    Room starts spinning, can't breath  . Ancef [Cefazolin] Other (See Comments)    Unknown  . Clindamycin/Lincomycin Hives  . Prednisone Other (See Comments)    Chest pain w/ cortisone shots or prednisone pills Elevated blood pressure  . Triprolidine Hcl   . Adhesive [Tape] Rash and Other (See Comments)    Sensitivity to certain  band aids and adhesives - burns skin, makes skin raw  . Other Rash and Other (See Comments)    Sensitivity to certain band aids and adhesives - burns skin, makes skin raw  . Sudafed [Pseudoephedrine Hcl] Other (See Comments)    Dizziness     Current Outpatient Medications on File Prior to Visit  Medication Sig Dispense Refill  . albuterol (VENTOLIN HFA) 108 (90 Base) MCG/ACT inhaler Inhale 2 puffs into the lungs every 4 (four) hours as needed for shortness of breath. 1 each 0  . amLODipine (NORVASC) 10 MG tablet TAKE 1 TABLET ONCE DAILY FOR BLOOD PRESSURE. 90 tablet 1  . benzonatate (TESSALON) 200 MG capsule Take 1 capsule (200 mg total) by mouth 3 (three) times daily as needed for cough. 15 capsule 0  . Blood Glucose Monitoring Suppl (ONETOUCH VERIO FLEX SYSTEM) w/Device KIT Use as instructed to test blood sugar 3 times daily 1 kit 0  . buPROPion (WELLBUTRIN XL) 150 MG 24 hr tablet TAKE 1 TABLET BY MOUTH ONCE DAILY FOR ANXIETY AND DEPRESSION. 90 tablet 1  . chlorpheniramine-HYDROcodone (TUSSIONEX PENNKINETIC ER) 10-8 MG/5ML SUER Take 5 mLs by mouth every 12 (twelve) hours as needed for cough. 50 mL 0  . clopidogrel (PLAVIX) 75 MG tablet TAKE 1 TABLET BY MOUTH EVERY DAY 90 tablet 3  . Continuous Blood Gluc Receiver (FREESTYLE LIBRE 14 DAY READER) DEVI 1 Device by Does not apply route in the morning, at noon, and at bedtime. 1 each 0  . Continuous Blood Gluc Sensor (FREESTYLE LIBRE 14 DAY SENSOR) MISC 1 Device by Does not apply route in the morning, at noon, and at bedtime. 2 each 3  . cyclobenzaprine (FLEXERIL) 10 MG  tablet TAKE 1 TABLET BY MOUTH AT BEDTIME AS NEEDED FOR MUSCLE SPASMS 90 tablet 0  . escitalopram (LEXAPRO) 20 MG tablet Take 1 tablet (20 mg total) by mouth daily. For anxiety and depression. 90 tablet 1  . folic acid (FOLVITE) 1 MG tablet Take 2 tablets by mouth daily. 180 tablet 1  . glucose blood test strip Use as instructed to blood sugar 3 times daily 300 each 2  . hydroxychloroquine (PLAQUENIL) 200 MG tablet TAKE 1 TABLET BY MOUTH TWICE A DAY 180 tablet 0  . insulin glargine (LANTUS) 100 UNIT/ML Solostar Pen Inject 30 Units into the skin at bedtime. 15 mL 3  . Insulin Pen Needle (PEN NEEDLES) 31G X 6 MM MISC Use with insulin as directed. 100 each 0  . metFORMIN (GLUCOPHAGE-XR) 500 MG 24 hr tablet TAKE 1 TABLET EVERY MORNING WITH FOOD FOR DIABETES. 90 tablet 1  . methotrexate (RHEUMATREX) 2.5 MG tablet TAKE 8 TABLETS (20 MG TOTAL) BY MOUTH ONCE A WEEK. CAUTION:CHEMOTHERAPY. PROTECT FROM LIGHT. 32 tablet 2  . metoprolol succinate (TOPROL-XL) 50 MG 24 hr tablet Take 1 tablet (50 mg total) by mouth daily. For blood pressure 90 tablet 3  . rosuvastatin (CRESTOR) 10 MG tablet Take 1 tablet (10 mg total) by mouth every evening. For cholesterol. 90 tablet 3  . spironolactone (ALDACTONE) 25 MG tablet Take 25 mg by mouth daily.    Marland Kitchen telmisartan-hydrochlorothiazide (MICARDIS HCT) 80-25 MG tablet Take by mouth.     No current facility-administered medications on file prior to visit.    BP 134/82   Pulse 100   Temp 97.6 F (36.4 C) (Temporal)   Ht 5' 6"  (1.676 m)   Wt 211 lb (95.7 kg)   SpO2 98%   BMI 34.06 kg/m  Objective:   Physical Exam Constitutional:      Appearance: She is well-nourished.  Cardiovascular:     Rate and Rhythm: Normal rate and regular rhythm.  Pulmonary:     Effort: Pulmonary effort is normal.     Breath sounds: Normal breath sounds.  Musculoskeletal:     Cervical back: Neck supple.  Skin:    General: Skin is warm and dry.  Psychiatric:        Mood and  Affect: Mood and affect and mood normal.            Assessment & Plan:

## 2021-02-17 NOTE — Telephone Encounter (Signed)
Patient evaluated.  

## 2021-02-17 NOTE — Assessment & Plan Note (Signed)
Compliant to Lantus 35 units and Metformin XR 500 mg. Recommended she discontinue Metformin XR given GI upset.  A1C pending. Await results.

## 2021-02-17 NOTE — Assessment & Plan Note (Signed)
Chronic and intermittent since November 2021. Reviewed chart and it appears that she's had atypical chest pain previously with negative work up.  Work up from 2019 and 2020 overall without ACS suspicion. Given her medical history coupled with symptoms and lack of improvement with antacids, will send to cardiology for evaluation.  Checking labs today.  CTA chest with mild emphysematous changes, negative for PE.

## 2021-02-17 NOTE — Patient Instructions (Addendum)
Stop by the lab prior to leaving today. I will notify you of your results once received.   You will be contacted regarding your referral to cardiology.  Please let us know if you have not been contacted within two weeks.   It was a pleasure to see you today!      Call for appointment for:  []   2D Mammogram  [x]   3D Mammogram  []   Bone Density     Your appointment will at the following location  []   [facility]  [place] [address]  [phone]  [x]   [place] [facility])   [address]. Room [place], [place] [address]  [phone]  []   [facility]      [place], [place]         []   [place], [place]  []  [place] [facility]   [address], [place] [address]  []  [place]  [address] [place], [place] [address] [phone]    Make sure to wear two peace clothing  No lotions powders or deodorants the day of the appointment Make sure to bring picture ID and insurance card.  Bring list of medications you are currently taking including any supplements.

## 2021-02-18 ENCOUNTER — Ambulatory Visit: Payer: Medicare Other | Admitting: Primary Care

## 2021-02-19 DIAGNOSIS — E119 Type 2 diabetes mellitus without complications: Secondary | ICD-10-CM

## 2021-02-19 DIAGNOSIS — E785 Hyperlipidemia, unspecified: Secondary | ICD-10-CM

## 2021-02-20 ENCOUNTER — Encounter: Payer: Self-pay | Admitting: Cardiology

## 2021-02-20 ENCOUNTER — Other Ambulatory Visit: Payer: Self-pay

## 2021-02-20 ENCOUNTER — Ambulatory Visit (INDEPENDENT_AMBULATORY_CARE_PROVIDER_SITE_OTHER): Payer: Medicare Other

## 2021-02-20 ENCOUNTER — Ambulatory Visit: Payer: Medicare Other | Admitting: Cardiology

## 2021-02-20 VITALS — BP 200/110 | HR 64 | Ht 66.0 in | Wt 212.2 lb

## 2021-02-20 DIAGNOSIS — R072 Precordial pain: Secondary | ICD-10-CM

## 2021-02-20 DIAGNOSIS — I10 Essential (primary) hypertension: Secondary | ICD-10-CM | POA: Diagnosis not present

## 2021-02-20 DIAGNOSIS — E7800 Pure hypercholesterolemia, unspecified: Secondary | ICD-10-CM | POA: Diagnosis not present

## 2021-02-20 DIAGNOSIS — R002 Palpitations: Secondary | ICD-10-CM

## 2021-02-20 MED ORDER — METOPROLOL TARTRATE 50 MG PO TABS: 50.0000 mg | ORAL_TABLET | Freq: Once | ORAL | 0 refills | Status: DC

## 2021-02-20 MED ORDER — TRULICITY 0.75 MG/0.5ML ~~LOC~~ SOAJ: 0.7500 mg | SUBCUTANEOUS | 0 refills | Status: DC

## 2021-02-20 MED ORDER — ROSUVASTATIN CALCIUM 20 MG PO TABS: 20.0000 mg | ORAL_TABLET | Freq: Every day | ORAL | 3 refills | Status: DC

## 2021-02-20 NOTE — Patient Instructions (Signed)
Medication Instructions:   Your physician recommends that you continue on your current medications as directed. Please refer to the Current Medication list given to you today.  *If you need a refill on your cardiac medications before your next appointment, please call your pharmacy*   Lab Work: None ordered If you have labs (blood work) drawn today and your tests are completely normal, you will receive your results only by: Marland Kitchen MyChart Message (if you have MyChart) OR . A paper copy in the mail If you have any lab test that is abnormal or we need to change your treatment, we will call you to review the results.   Testing/Procedures:  1.  Your physician has requested that you have cardiac CT. Cardiac computed tomography (CT) is a painless test that uses an x-ray machine to take clear, detailed pictures of your heart.  Your cardiac CT will be scheduled at:  [facility] [address] [place], [place] [address] [phone]  Please arrive 15 mins early for check-in and test prep.    Please follow these instructions carefully (unless otherwise directed):    On the Night Before the Test: . Be sure to Drink plenty of water. . Do not consume any caffeinated/decaffeinated beverages or chocolate 12 hours prior to your test. . Do not take any antihistamines 12 hours prior to your test.     On the Day of the Test: . Drink plenty of water until 1 hour prior to the test. . Do not eat any food 4 hours prior to the test. . You may take your regular medications prior to the test.  . Take metoprolol (Lopressor) two hours prior to test. (This was sent to your pharmacy) . HOLD telmisartan-hydrochlorothiazide  morning of the test. . FEMALES- please wear underwire-free bra if available         After the Test: . Drink plenty of water. . After receiving IV contrast, you may experience a mild flushed feeling. This is normal. . On occasion,  you may experience a mild rash up to 24 hours after the test. This is not dangerous. If this occurs, you can take Benadryl 25 mg and increase your fluid intake. . If you experience trouble breathing, this can be serious. If it is severe call 911 IMMEDIATELY. If it is mild, please call our office. . If you take any of these medications: Glipizide/Metformin, Avandament, Glucavance, please do not take 48 hours after completing test unless otherwise instructed.             metFORMIN (GLUCOPHAGE-XR) 500 MG 24 hr tablet  Once we have confirmed authorization from your insurance company, we will call you to set up a date and time for your test. Based on how quickly your insurance processes prior authorizations requests, please allow up to 4 weeks to be contacted for scheduling your Cardiac CT appointment. Be advised that routine Cardiac CT appointments could be scheduled as many as 8 weeks after your provider has ordered it.  For non-scheduling related questions, please contact the cardiac imaging nurse navigator should you have any questions/concerns: Marchia Bond, Cardiac Imaging Nurse Navigator Gordy Clement, Cardiac Imaging Nurse Navigator [facility] Direct Office Dial: [phone]   For scheduling needs, including cancellations and rescheduling, please call [place], [phone].    2.  Your physician has recommended that you wear a Zio monitor for 2 weeks. This monitor is a medical device that records the heart's electrical activity. Doctors  most often use these monitors to diagnose arrhythmias. Arrhythmias are problems with the speed or rhythm of the heartbeat. The monitor is a small device applied to your chest. You can wear one while you do your normal daily activities. While wearing this monitor if you have any symptoms to push the button and record what you felt. Once you have worn this monitor for the period of time provider prescribed (Usually 14 days), you will  return the monitor device in the postage paid box. Once it is returned they will download the data collected and provide [place] with a report which the provider will then review and we will call you with those results. Important tips:  1. Avoid showering during the first 24 hours of wearing the monitor. 2. Avoid excessive sweating to help maximize wear time. 3. Do not submerge the device, no hot tubs, and no swimming pools. 4. Keep any lotions or oils away from the patch. 5. After 24 hours you may shower with the patch on. Take brief showers with your back facing the shower head.  6. Do not remove patch once it has been placed because that will interrupt data and decrease adhesive wear time. 7. Push the button when you have any symptoms and write down what you were feeling. 8. Once you have completed wearing your monitor, remove and place into box which has postage paid and place in your outgoing mailbox.  9. If for some reason you have misplaced your box then call our office and we can provide another box and/or mail it off for you.          Follow-Up: At [facility], you and your health needs are our priority.  As part of our continuing mission to provide you with exceptional heart care, we have created designated Provider Care Teams.  These Care Teams include your primary Cardiologist (physician) and Advanced Practice Providers (APPs -  Physician Assistants and Nurse Practitioners) who all work together to provide you with the care you need, when you need it.  We recommend signing up for the patient portal called "MyChart".  Sign up information is provided on this After Visit Summary.  MyChart is used to connect with patients for Virtual Visits (Telemedicine).  Patients are able to view lab/test results, encounter notes, upcoming appointments, etc.  Non-urgent messages can be sent to your provider as well.   To learn more about what you can do with MyChart, go to [email or web address].     Your next appointment:   6-7 week(s)  The format for your next appointment:   In Person  Provider:   [name] Sable, MD   Other Instructions

## 2021-02-20 NOTE — Progress Notes (Signed)
Cardiology Office Note:    Date:  02/20/2021   ID:  [name], DOB Oct 29, 1966, MRN [id number]  PCP:  Pleas Koch, NP   [place]  Cardiologist:  [name] Sable, MD  Advanced Practice Provider:  No care team member to display Electrophysiologist:  None       Referring MD: Pleas Koch, NP   Chief Complaint  Patient presents with  . Other    Chest pain. Meds reviewed verbally with pt.    History of Present Illness:    Lauren Winters is a 55 y.o. female with a hx of anxiety, hypertension, hyperlipidemia, diabetes, CVA 2019, former smoker x10 years who presents due to chest pain.  Patient states having symptoms of chest pain over the past 2 months.  Symptoms of chest pain are not related with exertion.  She has a history of CVA in 2019, symptoms of left-sided weakness.  Her stroke was deemed secondary to cardiac risk factors of diabetes, hypertension.  Endorses palpitations which usually worsened when she walks.  Denies dizziness.  She uses a cane due to back issues/surgery.  Echocardiogram 02/2019 showed normal systolic and diastolic function, EF 60 to 65%.  Outside [facility] 07/2017 was normal with no evidence for ischemia.  Past Medical History:  Diagnosis Date  . Anxiety   . Atypical chest pain    a. ETT 05/2014: normal stress test without evidence of myocardial ischemia or chest pain at peak stress  . Chronic kidney disease   . Complication of anesthesia    migraine headache  . Depression   . Diabetes mellitus without complication ([place]) metformin   dx 2015  . Dyspnea    on exertion  . Fibromyalgia   . Headache   . Heart murmur    told back in 2007, but no mention with 2016 cardiac w/u  . Hyperlipidemia   . Hypertension   . Lupus (systemic lupus erythematosus) ([place])   . Mitral regurgitation    a. 05/2014 echo: EF >55%, nl LV/RV sys fxn, mild MR/AR, no valvular stenosis   . Obesity (BMI 30-39.9)   . Raynaud's  phenomenon   . Rheumatoid arthritis ([place])   . S/P lumbar laminectomy 12/09/2017  . S/P lumbar spinal fusion 05/26/2018  . Sjogren's disease ([place])   . Spondylolisthesis of lumbar region   . Stroke ([place])   . Tobacco abuse    a. ongoing; b. ongoing SOB  . Wound infection after surgery 01/11/2018    Past Surgical History:  Procedure Laterality Date  . ABDOMINAL HYSTERECTOMY    . BACK SURGERY    . BONE MARROW BIOPSY  10/2019  . CESAREAN SECTION     x3  . LUMBAR LAMINECTOMY    . LUMBAR LAMINECTOMY/DECOMPRESSION MICRODISCECTOMY N/A 03/29/2017   Procedure: Re operative Right Lumbar Three-Four Laminectomy and discectomy with Left Lumbar Three-Four, Bilateral Lumbar Four-Five Laminectomy for decompression;  Surgeon: Kevan Ny Ditty, MD;  Location: [facility];  Service: Neurosurgery;  Laterality: N/A;  . SPINAL CORD STIMULATOR INSERTION  12/09/2017   Procedure: LUMBAR SPINAL CORD STIMULATOR IMPLANT;  Surgeon: Eustace Moore, MD;  Location: [facility];  Service: Neurosurgery;;  . SPINAL CORD STIMULATOR REMOVAL N/A 01/11/2018   Procedure: LUMBAR SPINAL CORD STIMULATOR REMOVAL;  Surgeon: Eustace Moore, MD;  Location: [facility];  Service: Neurosurgery;  Laterality: N/A;  . TUBAL LIGATION      Current Medications: Current Meds  Medication Sig  . albuterol (VENTOLIN HFA) 108 (90  Base) MCG/ACT inhaler Inhale 2 puffs into the lungs every 4 (four) hours as needed for shortness of breath.  Marland Kitchen amLODipine (NORVASC) 10 MG tablet TAKE 1 TABLET ONCE DAILY FOR BLOOD PRESSURE.  . benzonatate (TESSALON) 200 MG capsule Take 1 capsule (200 mg total) by mouth 3 (three) times daily as needed for cough.  . Blood Glucose Monitoring Suppl ([place]) w/Device KIT Use as instructed to test blood sugar 3 times daily  . buPROPion (WELLBUTRIN XL) 150 MG 24 hr tablet TAKE 1 TABLET BY MOUTH ONCE DAILY FOR ANXIETY AND DEPRESSION.  . chlorpheniramine-HYDROcodone (TUSSIONEX PENNKINETIC ER) 10-8 MG/5ML SUER Take 5 mLs by  mouth every 12 (twelve) hours as needed for cough.  . clopidogrel (PLAVIX) 75 MG tablet TAKE 1 TABLET BY MOUTH EVERY DAY  . Continuous Blood Gluc Receiver (FREESTYLE LIBRE 14 DAY READER) DEVI 1 Device by Does not apply route in the morning, at noon, and at bedtime.  . Continuous Blood Gluc Sensor (FREESTYLE LIBRE 14 DAY SENSOR) MISC 1 Device by Does not apply route in the morning, at noon, and at bedtime.  . cyclobenzaprine (FLEXERIL) 10 MG tablet TAKE 1 TABLET BY MOUTH AT BEDTIME AS NEEDED FOR MUSCLE SPASMS  . escitalopram (LEXAPRO) 20 MG tablet Take 1 tablet (20 mg total) by mouth daily. For anxiety and depression.  . folic acid (FOLVITE) 1 MG tablet Take 2 tablets by mouth daily.  Marland Kitchen glucose blood test strip Use as instructed to blood sugar 3 times daily  . hydroxychloroquine (PLAQUENIL) 200 MG tablet TAKE 1 TABLET BY MOUTH TWICE A DAY  . insulin glargine (LANTUS) 100 UNIT/ML Solostar Pen Inject 30 Units into the skin at bedtime.  . Insulin Pen Needle (PEN NEEDLES) 31G X 6 MM MISC Use with insulin as directed.  . metFORMIN (GLUCOPHAGE-XR) 500 MG 24 hr tablet TAKE 1 TABLET EVERY MORNING WITH FOOD FOR DIABETES.  . methotrexate (RHEUMATREX) 2.5 MG tablet TAKE 8 TABLETS (20 MG TOTAL) BY MOUTH ONCE A WEEK. CAUTION:CHEMOTHERAPY. PROTECT FROM LIGHT.  Marland Kitchen metoprolol succinate (TOPROL-XL) 50 MG 24 hr tablet Take 1 tablet (50 mg total) by mouth daily. For blood pressure  . metoprolol tartrate (LOPRESSOR) 50 MG tablet Take 1 tablet (50 mg total) by mouth once for 1 dose. Take 2 hours prior to your CT scan.  . rosuvastatin (CRESTOR) 10 MG tablet Take 1 tablet (10 mg total) by mouth every evening. For cholesterol.  Marland Kitchen spironolactone (ALDACTONE) 25 MG tablet Take 25 mg by mouth daily.  Marland Kitchen telmisartan-hydrochlorothiazide (MICARDIS HCT) 80-25 MG tablet Take by mouth.     Allergies:   Pseudoephedrine hcl, Ancef [cefazolin], Clindamycin/lincomycin, Prednisone, Triprolidine hcl, Adhesive [tape], Other, and Sudafed  [pseudoephedrine hcl]   Social History   Socioeconomic History  . Marital status: Married    Spouse name: Not on file  . Number of children: Not on file  . Years of education: Not on file  . Highest education level: Not on file  Occupational History  . Not on file  Tobacco Use  . Smoking status: Former Smoker    Packs/day: 0.50    Years: 10.00    Pack years: 5.00    Types: Cigarettes    Quit date: 07/22/2015    Years since quitting: 5.5  . Smokeless tobacco: Never Used  Vaping Use  . Vaping Use: Never used  Substance and Sexual Activity  . Alcohol use: No    Alcohol/week: 0.0 standard drinks  . Drug use: No  . Sexual  activity: Not Currently  Other Topics Concern  . Not on file  Social History Narrative   Married.   3 children.   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    Social Determinants of Health   Financial Resource Strain: Not on file  Food Insecurity: Not on file  Transportation Needs: Not on file  Physical Activity: Not on file  Stress: Not on file  Social Connections: Not on file     Family History: The patient's family history includes Breast cancer in her paternal aunt, paternal aunt, paternal aunt, paternal aunt, and paternal aunt; CAD in her maternal grandfather, paternal aunt, paternal aunt, paternal aunt, paternal aunt, paternal aunt, and paternal uncle; CAD (age of onset: 62) in her father; Heart Problems in her brother; Hypertension in her father; Liver cancer (age of onset: 43) in her father; Stroke in her maternal grandfather and maternal grandmother; Valvular heart disease in her father.  ROS:   Please see the history of present illness.     All other systems reviewed and are negative.  EKGs/Labs/Other Studies Reviewed:    The following studies were reviewed today:   EKG:  EKG is  ordered today.  The ekg ordered today demonstrates normal sinus rhythm,  Recent Labs: 10/20/2020: ALT 8 02/17/2021: BUN 13;  Creatinine, Ser 0.74; Hemoglobin 13.9; Platelets 350.0; Potassium 4.0; Sodium 136; TSH 0.99  Recent Lipid Panel    Component Value Date/Time   CHOL 182 02/17/2021 0941   TRIG 219.0 (H) 02/17/2021 0941   HDL 43.60 02/17/2021 0941   CHOLHDL 4 02/17/2021 0941   VLDL 43.8 (H) 02/17/2021 0941   LDLCALC 91 03/03/2019 0510   LDLDIRECT 103.0 02/17/2021 0941     Risk Assessment/Calculations:      Physical Exam:    VS:  BP (!) 200/110 (BP Location: Right Arm, Patient Position: Sitting, Cuff Size: Normal)   Pulse 64   Ht _0  (1.676 m)   Wt 212 lb 4 oz (96.3 kg)   SpO2 98%   BMI 34.26 kg/m     Wt Readings from Last 3 Encounters:  02/20/21 212 lb 4 oz (96.3 kg)  02/17/21 211 lb (95.7 kg)  01/14/21 212 lb (96.2 kg)     GEN:  Well nourished, well developed in no acute distress HEENT: Normal NECK: No JVD; No carotid bruits LYMPHATICS: No lymphadenopathy CARDIAC: RRR, no murmurs, rubs, gallops RESPIRATORY:  Clear to auscultation without rales, wheezing or rhonchi  ABDOMEN: Soft, non-tender, non-distended MUSCULOSKELETAL:  No edema; No deformity  SKIN: Warm and dry NEUROLOGIC:  Alert and oriented x 3 PSYCHIATRIC:  Normal affect   ASSESSMENT:    1. Precordial pain   2. Primary hypertension   3. Pure hypercholesterolemia   4. Palpitations    PLAN:    In order of problems listed above:  1. Precordial pain, risk factors of hypertension, diabetes, hyperlipidemia.  Last echo with preserved ejection fraction, mild LVH.  EF 60 to 65%.  Previous [employer] with no evidence of ischemia.  Obtain coronary CTA to evaluate presence of CAD. 2. Hypertension, moderately from walking to the office, previous BP normal, blood pressures at home in the 130s.  Repeat blood pressure in the office showed improvement with systolic 786, diastolic 90.  Continue current BP meds. 3. Hyperlipidemia, continue statin. 4. Patient with palpitations, prior CVA.  Continue Plavix.  Placed 2-week cardiac  monitor to evaluate any significant arrhythmia such as A. fib or flutter which might contribute  to stroke.  Follow-up after cardiac monitor coronary CTA.      Medication Adjustments/Labs and Tests Ordered: Current medicines are reviewed at length with the patient today.  Concerns regarding medicines are outlined above.  Orders Placed This Encounter  Procedures  . CT CORONARY MORPH W/CTA COR W/SCORE W/CA W/CM &/OR WO/CM  . CT CORONARY FRACTIONAL FLOW RESERVE DATA PREP  . CT CORONARY FRACTIONAL FLOW RESERVE FLUID ANALYSIS  . LONG TERM MONITOR (3-14 DAYS)   Meds ordered this encounter  Medications  . metoprolol tartrate (LOPRESSOR) 50 MG tablet    Sig: Take 1 tablet (50 mg total) by mouth once for 1 dose. Take 2 hours prior to your CT scan.    Dispense:  1 tablet    Refill:  0    Patient Instructions  Medication Instructions:   Your physician recommends that you continue on your current medications as directed. Please refer to the Current Medication list given to you today.  *If you need a refill on your cardiac medications before your next appointment, please call your pharmacy*   Lab Work: None ordered If you have labs (blood work) drawn today and your tests are completely normal, you will receive your results only by: Marland Kitchen MyChart Message (if you have MyChart) OR . A paper copy in the mail If you have any lab test that is abnormal or we need to change your treatment, we will call you to review the results.   Testing/Procedures:  1.  Your physician has requested that you have cardiac CT. Cardiac computed tomography (CT) is a painless test that uses an x-ray machine to take clear, detailed pictures of your heart.  Your cardiac CT will be scheduled at:  [facility] [address] [place], [place] [address] [phone]  Please arrive 15 mins early for check-in and test prep.    Please follow these instructions carefully  (unless otherwise directed):    On the Night Before the Test: . Be sure to Drink plenty of water. . Do not consume any caffeinated/decaffeinated beverages or chocolate 12 hours prior to your test. . Do not take any antihistamines 12 hours prior to your test.     On the Day of the Test: . Drink plenty of water until 1 hour prior to the test. . Do not eat any food 4 hours prior to the test. . You may take your regular medications prior to the test.  . Take metoprolol (Lopressor) two hours prior to test. (This was sent to your pharmacy) . HOLD telmisartan-hydrochlorothiazide  morning of the test. . FEMALES- please wear underwire-free bra if available         After the Test: . Drink plenty of water. . After receiving IV contrast, you may experience a mild flushed feeling. This is normal. . On occasion, you may experience a mild rash up to 24 hours after the test. This is not dangerous. If this occurs, you can take Benadryl 25 mg and increase your fluid intake. . If you experience trouble breathing, this can be serious. If it is severe call 911 IMMEDIATELY. If it is mild, please call our office. . If you take any of these medications: Glipizide/Metformin, Avandament, Glucavance, please do not take 48 hours after completing test unless otherwise instructed.             metFORMIN (GLUCOPHAGE-XR) 500 MG 24 hr tablet  Once we have confirmed authorization from your insurance company, we will call you  to set up a date and time for your test. Based on how quickly your insurance processes prior authorizations requests, please allow up to 4 weeks to be contacted for scheduling your Cardiac CT appointment. Be advised that routine Cardiac CT appointments could be scheduled as many as 8 weeks after your provider has ordered it.  For non-scheduling related questions, please contact the cardiac imaging nurse navigator should you have any questions/concerns: Marchia Bond, Cardiac Imaging Nurse  Navigator Gordy Clement, Cardiac Imaging Nurse Navigator [facility] Direct Office Dial: [phone]   For scheduling needs, including cancellations and rescheduling, please call [place], [phone].    2.  Your physician has recommended that you wear a Zio monitor for 2 weeks. This monitor is a medical device that records the heart's electrical activity. Doctors most often use these monitors to diagnose arrhythmias. Arrhythmias are problems with the speed or rhythm of the heartbeat. The monitor is a small device applied to your chest. You can wear one while you do your normal daily activities. While wearing this monitor if you have any symptoms to push the button and record what you felt. Once you have worn this monitor for the period of time provider prescribed (Usually 14 days), you will return the monitor device in the postage paid box. Once it is returned they will download the data collected and provide [place] with a report which the provider will then review and we will call you with those results. Important tips:  1. Avoid showering during the first 24 hours of wearing the monitor. 2. Avoid excessive sweating to help maximize wear time. 3. Do not submerge the device, no hot tubs, and no swimming pools. 4. Keep any lotions or oils away from the patch. 5. After 24 hours you may shower with the patch on. Take brief showers with your back facing the shower head.  6. Do not remove patch once it has been placed because that will interrupt data and decrease adhesive wear time. 7. Push the button when you have any symptoms and write down what you were feeling. 8. Once you have completed wearing your monitor, remove and place into box which has postage paid and place in your outgoing mailbox.  9. If for some reason you have misplaced your box then call our office and we can provide another box and/or mail it off for you.          Follow-Up: At [facility],  you and your health needs are our priority.  As part of our continuing mission to provide you with exceptional heart care, we have created designated Provider Care Teams.  These Care Teams include your primary Cardiologist (physician) and Advanced Practice Providers (APPs -  Physician Assistants and Nurse Practitioners) who all work together to provide you with the care you need, when you need it.  We recommend signing up for the patient portal called "MyChart".  Sign up information is provided on this After Visit Summary.  MyChart is used to connect with patients for Virtual Visits (Telemedicine).  Patients are able to view lab/test results, encounter notes, upcoming appointments, etc.  Non-urgent messages can be sent to your provider as well.   To learn more about what you can do with MyChart, go to [email or web address].    Your next appointment:   6-7 week(s)  The format for your next appointment:   In Person  Provider:   [name] Sable, MD   Other Instructions  Signed, [name] Sable, MD  02/20/2021 12:26 PM    [place]

## 2021-02-23 NOTE — Addendum Note (Signed)
Addended by: Britt Bottom on: 02/23/2021 09:03 AM   Modules accepted: Orders

## 2021-03-03 ENCOUNTER — Ambulatory Visit: Payer: Medicare Other | Admitting: Primary Care

## 2021-03-05 NOTE — Progress Notes (Signed)
Office Visit Note  Patient: Lauren Winters             Date of Birth: Apr 12, 1966           MRN: [id number]             PCP: Pleas Koch, NP Referring: Pleas Koch, NP Visit Date: 03/19/2021 Occupation: @GUAROCC @  Subjective:  Medication management.   History of Present Illness: Lauren Winters is a 55 y.o. female with a history of systemic lupus, osteoarthritis and degenerative disc disease.  She has not had any recent oral ulcers, malar rash, subcutaneous lupus rash.  She continues to have sicca symptoms, Raynaud's phenomenon, fatigue and hair loss.  She has been taking methotrexate and Plaquenil on a regular basis.  She states recently she has been having increased pain and discomfort in her right shoulder and right hand.  She has not seen any swelling.  She continues to has discomfort in her bilateral trochanteric bursa.  Right trochanteric bursitis more painful.  She continues to have discomfort in her lower back.  She is followed by a spinal specialist.  She has been advised that in the future she may need spinal fusion.  Activities of Daily Living:  Patient reports morning stiffness for several hours.   Patient Reports nocturnal pain.  Difficulty dressing/grooming: Reports Difficulty climbing stairs: Reports Difficulty getting out of chair: Reports Difficulty using hands for taps, buttons, cutlery, and/or writing: Reports  Review of Systems  Constitutional: Positive for fatigue.  HENT: Positive for mouth dryness and nose dryness. Negative for mouth sores.   Eyes: Positive for visual disturbance and dryness. Negative for pain and itching.  Respiratory: Positive for shortness of breath and difficulty breathing. Negative for cough and hemoptysis.   Cardiovascular: Positive for chest pain and palpitations. Negative for swelling in legs/feet.  Gastrointestinal: Negative for abdominal pain, blood in stool, constipation and diarrhea.  Endocrine: Negative for increased  urination.  Genitourinary: Negative for painful urination.  Musculoskeletal: Positive for arthralgias, joint pain, myalgias, muscle weakness, morning stiffness, muscle tenderness and myalgias. Negative for joint swelling.  Skin: Negative for color change, rash and redness.  Allergic/Immunologic: Negative for susceptible to infections.  Neurological: Positive for dizziness, numbness, headaches, memory loss and weakness.  Hematological: Positive for swollen glands.  Psychiatric/Behavioral: Positive for confusion and sleep disturbance.    PMFS History:  Patient Active Problem List   Diagnosis Date Noted  . Cough 01/14/2021  . Adenopathy, cervical 08/18/2020  . Monoclonal gammopathy 05/14/2020  . Paresthesias 03/02/2019  . History of CVA (cerebrovascular accident) 08/19/2018  . High risk medication use 02/03/2018  . HNP (herniated nucleus pulposus), lumbar 03/29/2017  . Chronic right shoulder pain 02/18/2017  . Primary osteoarthritis of both hands 12/22/2016  . Primary osteoarthritis of both knees 12/22/2016  . Spondylosis of lumbar region without myelopathy or radiculopathy 12/22/2016  . Fibromyalgia 12/22/2016  . Preventative health care 04/19/2016  . Chronic back pain 12/17/2015  . Hyperlipidemia 08/29/2015  . Other organ or system involvement in systemic lupus erythematosus ([place]) 08/04/2015  . Rheumatoid factor positive 08/04/2015  . Sjogren's syndrome ([place]) 08/04/2015  . Mitral regurgitation   . Tobacco abuse   . Atypical chest pain   . Obesity (BMI 30-39.9)   . Anxiety   . Hypertension   . Diabetes mellitus without complication ([place])   . Clinical depression 05/23/2014  . Chest pain 05/23/2014    Past Medical History:  Diagnosis Date  . Anxiety   .  Atypical chest pain    a. ETT 05/2014: normal stress test without evidence of myocardial ischemia or chest pain at peak stress  . Chronic kidney disease   . Complication of anesthesia    migraine headache  . Depression   .  Diabetes mellitus without complication ([place]) metformin   dx 2015  . Dyspnea    on exertion  . Fibromyalgia   . Headache   . Heart murmur    told back in 2007, but no mention with 2016 cardiac w/u  . Hyperlipidemia   . Hypertension   . Lupus (systemic lupus erythematosus) ([place])   . Mitral regurgitation    a. 05/2014 echo: EF >55%, nl LV/RV sys fxn, mild MR/AR, no valvular stenosis   . Obesity (BMI 30-39.9)   . Raynaud's phenomenon   . Rheumatoid arthritis ([place])   . S/P lumbar laminectomy 12/09/2017  . S/P lumbar spinal fusion 05/26/2018  . Sjogren's disease ([place])   . Spondylolisthesis of lumbar region   . Stroke ([place])   . Tobacco abuse    a. ongoing; b. ongoing SOB  . Wound infection after surgery 01/11/2018    Family History  Problem Relation Age of Onset  . CAD Father 40       CABG x 4  . Liver cancer Father 46       passed  . Valvular heart disease Father   . Hypertension Father   . CAD Paternal Aunt        CABG  . Breast cancer Paternal Aunt   . CAD Paternal Aunt        CABG  . Breast cancer Paternal Aunt   . CAD Paternal Aunt        CABG  . Breast cancer Paternal Aunt   . CAD Paternal Aunt        CABG  . Breast cancer Paternal Aunt   . CAD Paternal Aunt        CABG  . Breast cancer Paternal Aunt   . CAD Paternal Uncle        MI s/p CABG  . Stroke Maternal Grandmother   . Stroke Maternal Grandfather   . CAD Maternal Grandfather   . Heart Problems Brother    Past Surgical History:  Procedure Laterality Date  . ABDOMINAL HYSTERECTOMY    . BACK SURGERY    . BONE MARROW BIOPSY  10/2019  . CESAREAN SECTION     x3  . LUMBAR LAMINECTOMY    . LUMBAR LAMINECTOMY/DECOMPRESSION MICRODISCECTOMY N/A 03/29/2017   Procedure: Re operative Right Lumbar Three-Four Laminectomy and discectomy with Left Lumbar Three-Four, Bilateral Lumbar Four-Five Laminectomy for decompression;  Surgeon: Kevan Ny Ditty, MD;  Location: [facility];  Service: Neurosurgery;  Laterality: N/A;   . SPINAL CORD STIMULATOR INSERTION  12/09/2017   Procedure: LUMBAR SPINAL CORD STIMULATOR IMPLANT;  Surgeon: Eustace Moore, MD;  Location: [facility];  Service: Neurosurgery;;  . SPINAL CORD STIMULATOR REMOVAL N/A 01/11/2018   Procedure: LUMBAR SPINAL CORD STIMULATOR REMOVAL;  Surgeon: Eustace Moore, MD;  Location: [facility];  Service: Neurosurgery;  Laterality: N/A;  . TUBAL LIGATION     Social History   Social History Narrative   Married.   3 children.   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    Immunization History  Administered Date(s) Administered  . Influenza,inj,Quad PF,6+ Mos 10/19/2016, 10/21/2017, 01/31/2019, 10/19/2019  . PFIZER(Purple Top)SARS-COV-2 Vaccination 04/17/2020, 05/08/2020  . Pneumococcal Polysaccharide-23 10/27/2014, 06/25/2019  .  Td 04/19/2016  . Zoster 10/27/2014     Objective: Vital Signs: BP (!) 179/78 (BP Location: Left Arm, Patient Position: Sitting, Cuff Size: Normal)   Pulse 61   Ht 5' 6"  (1.676 m)   Wt 216 lb 9.6 oz (98.2 kg)   BMI 34.96 kg/m    Physical Exam Vitals and nursing note reviewed.  Constitutional:      Appearance: She is well-developed.  HENT:     Head: Normocephalic and atraumatic.  Eyes:     Conjunctiva/sclera: Conjunctivae normal.  Cardiovascular:     Rate and Rhythm: Normal rate and regular rhythm.     Heart sounds: Normal heart sounds.  Pulmonary:     Effort: Pulmonary effort is normal.     Breath sounds: Normal breath sounds.  Abdominal:     General: Bowel sounds are normal.     Palpations: Abdomen is soft.  Musculoskeletal:     Cervical back: Normal range of motion.  Lymphadenopathy:     Cervical: No cervical adenopathy.  Skin:    General: Skin is warm and dry.     Capillary Refill: Capillary refill takes less than 2 seconds.  Neurological:     Mental Status: She is alert and oriented to person, place, and time.  Psychiatric:        Behavior: Behavior normal.       Musculoskeletal Exam: C-spine was in good range of motion.  Her right shoulder abduction was limited to 90 degrees with some discomfort.  Left shoulder joint was in good range of motion.  Elbow joints and wrist joints with good range of motion.  She had no tenderness or synovitis over wrist joints or MCPs or PIPs.  Hip joints and knee joints with good range of motion.  There was no tenderness over ankles or MTPs.  CDAI Exam: CDAI Score: - Patient Global: -; Provider Global: - Swollen: -; Tender: - Joint Exam 03/19/2021   No joint exam has been documented for this visit   There is currently no information documented on the homunculus. Go to the Rheumatology activity and complete the homunculus joint exam.  Investigation: No additional findings.  Imaging: CT CORONARY MORPH W/CTA COR W/SCORE W/CA W/CM &/OR WO/CM  Addendum Date: 03/12/2021   ADDENDUM REPORT: 03/12/2021 16:00 EXAM: OVER-READ INTERPRETATION  CT CHEST The following report is an over-read performed by radiologist Dr. Norlene Duel [facility] on 03/12/2021. This over-read does not include interpretation of cardiac or coronary anatomy or pathology. The coronary calcium score and coronary CTA interpretation by the cardiologist is attached. COMPARISON:  CT angio chest 01/16/2021. FINDINGS: Mediastinum/nodes: No mass or adenopathy identified. Lungs/pleura: No pleural effusion, airspace consolidation or atelectasis. No suspicious pulmonary nodule or mass identified. Upper abdomen: No acute findings within the imaged portions of the upper abdomen. Musculoskeletal: No acute or suspicious osseous findings. IMPRESSION: Negative over-read. Electronically Signed   By: Kerby Moors M.D.   On: 03/12/2021 16:00   Result Date: 03/12/2021 CLINICAL DATA:  Chest pain EXAM: Cardiac/Coronary  CTA TECHNIQUE: The patient was scanned on a Siemens Somatoform go.Top scanner. FINDINGS: A retrospective scan was triggered in the descending  thoracic aorta. Axial non-contrast 3 mm slices were carried out through the heart. The data set was analyzed on a dedicated work station and scored using the [place]. Gantry rotation speed was 330 msecs and collimation was .6 mm. 71m of metoprolol and 0.8 mg of sl NTG was given. The 3D data set was reconstructed in 5% intervals  of the 60-95 % of the R-R cycle. Diastolic phases were analyzed on a dedicated work station using MPR, MIP and VRT modes. The patient received 90 cc of contrast. Aorta: Normal size. Minimal aortic arch calcifications. No dissection. Aortic Valve:  Trileaflet.  No calcifications. Coronary Arteries:  Normal coronary origin.  Right dominance. RCA is a dominant artery that gives rise to PDA and PLA. There is no plaque. Left main is a large artery that gives rise to LAD and LCX arteries. LAD has no plaque. LCX is a non-dominant artery that gives rise to one obtuse marginal branch. There is no plaque. Other findings: Normal pulmonary vein drainage into the left atrium. Normal left atrial appendage without a thrombus. Normal size of the pulmonary artery. IMPRESSION: 1. Coronary calcium score of 0. Patient is low risk for coronary events 2. Normal coronary origin with right dominance. 3. No evidence of CAD. 4. CAD-RADS 0. Consider non-atherosclerotic causes of chest pain. Electronically Signed: By: [name] Sable M.D. On: 03/12/2021 15:41   LONG TERM MONITOR (3-14 DAYS)  Result Date: 03/16/2021 Patch Wear Time:  13 days and 21 hours (2022-02-25T11:53:18-0500 to 2022-03-11T09:49:13-0500) Patient had a min HR of 44 bpm, max HR of 167 bpm, and avg HR of 75 bpm. Predominant underlying rhythm was Sinus Rhythm. 8 Supraventricular Tachycardia runs occurred, the run with the fastest interval lasting 9 beats with a max rate of 167 bpm, the longest lasting 10 beats with an avg rate of 93 bpm. Some episodes of Supraventricular Tachycardia may be possible Atrial Tachycardia with variable block.  Supraventricular Tachycardia was detected within +/- 45 seconds of symptomatic patient event(s). Isolated SVEs were rare (<1.0%), SVE Couplets were rare (<1.0%), and SVE Triplets were rare (<1.0%). Isolated VEs were rare (<1.0%), No episodes of atrial fibrillation or atrial flutter noted.   Recent Labs: Lab Results  Component Value Date   WBC 6.6 02/17/2021   HGB 13.9 02/17/2021   PLT 350.0 02/17/2021   NA 136 02/17/2021   K 4.0 02/17/2021   CL 97 02/17/2021   CO2 28 02/17/2021   GLUCOSE 228 (H) 02/17/2021   BUN 13 02/17/2021   CREATININE 0.74 02/17/2021   BILITOT 0.4 10/20/2020   ALKPHOS 101 03/02/2019   AST 12 10/20/2020   ALT 8 10/20/2020   PROT 7.3 10/20/2020   ALBUMIN 4.4 03/02/2019   CALCIUM 9.6 02/17/2021   GFRAA 103 10/20/2020    Speciality Comments: PLQ Eye Exam: 10/24/2020 WNL @ [facility] Follow up in 1 year  Procedures:  No procedures performed Allergies: Pseudoephedrine hcl, Ancef [cefazolin], Clindamycin/lincomycin, Prednisone, Triprolidine hcl, Adhesive [tape], Other, and Sudafed [pseudoephedrine hcl]   Assessment / Plan:     Visit Diagnoses: Other organ or system involvement in systemic lupus erythematosus ([place]) - +La, oral ulcers, malar rash, fatigue, photosensitivity, Raynaud's, Subcutaneous lupus-bx+, hair loss, +RF, sicca symptoms:  -She gives history of ongoing Raynauds, fatigue and sicca symptoms.  She denies any recent problems with oral ulcers, malar rash or photosensitivity.  She has not had subcutaneous rash in a long time.  I will check following labs in May.  Plan: Urinalysis, Routine w reflex microscopic, Anti-DNA antibody, double-stranded, C3 and C4, Sedimentation rate, Rheumatoid factor.  Nancy Fetter protection was discussed and handout was placed in the AVS.  High risk medication use - PLQ 200 mg 1 tablet by mouth twice daily, methotrexate 8 tabs by mouth once weekly, folic acid 81m po qd. PLQ Eye Exam: 10/24/2020 - Plan: CBC with  Differential/Platelet, COMPLETE  METABOLIC PANEL WITH GFR, Serum protein electrophoresis with reflex in May and then every 3 months to monitor for drug toxicity.  Sjogren's syndrome with other organ involvement (HCC)-over-the-counter products were discussed.  She continues to have sicca symptoms.  Raynaud's syndrome without gangrene-her Raynauds continues to be active.  Keeping core temperature warm was discussed.  Chronic right shoulder pain-she has been having increased pain and discomfort in her right shoulder.  She had difficulty raising her right arm.  She declined x-rays.  She declined physical therapy.  Have given her a handout on shoulder joint exercises.  If her symptoms persist she may want to see an orthopedic surgeon for evaluation.  Primary osteoarthritis of both hands-she has underlying osteoarthritis which causes discomfort.  She complains of right hand discomfort but no synovitis was noted.  Primary osteoarthritis of both knees - She had SynviscOne injections in bilateral knee joints on 03/03/2020.   Trochanteric bursitis of both hips-she continues to have discomfort in the bilateral trochanteric area.  IT band exercises were given.  S/P lumbar laminectomy-she has severe lower back pain.  She states she has been seeing a spine specialist.  She may required spinal fusion in the future.  Fibromyalgia-she has generalized hyperalgesia and positive tender points.  She has ongoing discomfort.  History of CVA (cerebrovascular accident) - 07/2018, left-sided hemiparesis.  She is on Plavix.  Essential hypertension-systolic blood pressure is elevated.  Have advised her to monitor blood pressure closely and follow-up with her PCP.  Mitral valve insufficiency, unspecified etiology  Diabetes mellitus without complication [facility])  Former smoker  Osteoporosis screening - May 04, 2017 DEXA scan was normal done by her PCP.  Vitamin D deficiency -I will check vitamin D level with next labs.   Plan: VITAMIN D 25 Hydroxy (Vit-D Deficiency, Fractures).  We would like to keep vitamin D level around 40.  Orders: Orders Placed This Encounter  Procedures  . CBC with Differential/Platelet  . COMPLETE METABOLIC PANEL WITH GFR  . Urinalysis, Routine w reflex microscopic  . Anti-DNA antibody, double-stranded  . C3 and C4  . Sedimentation rate  . VITAMIN D 25 Hydroxy (Vit-D Deficiency, Fractures)  . Rheumatoid factor  . Serum protein electrophoresis with reflex   No orders of the defined types were placed in this encounter.  .  Follow-Up Instructions: Return for Systemic lupus, Osteoarthritis.   Bo Merino, MD  Note - This record has been created using Editor, commissioning.  Chart creation errors have been sought, but may not always  have been located. Such creation errors do not reflect on  the standard of medical care.

## 2021-03-06 DIAGNOSIS — N182 Chronic kidney disease, stage 2 (mild): Secondary | ICD-10-CM | POA: Diagnosis not present

## 2021-03-06 DIAGNOSIS — I10 Essential (primary) hypertension: Secondary | ICD-10-CM | POA: Diagnosis not present

## 2021-03-06 DIAGNOSIS — R072 Precordial pain: Secondary | ICD-10-CM

## 2021-03-06 DIAGNOSIS — R002 Palpitations: Secondary | ICD-10-CM | POA: Diagnosis not present

## 2021-03-11 ENCOUNTER — Telehealth (HOSPITAL_COMMUNITY): Payer: Self-pay | Admitting: Emergency Medicine

## 2021-03-11 NOTE — Telephone Encounter (Signed)
Attempted to call patient regarding upcoming cardiac CT appointment. °Left message on voicemail with name and callback number °Rainier Feuerborn RN Navigator Cardiac Imaging °[facility] °[phone] Office °[phone] Cell ° °

## 2021-03-11 NOTE — Telephone Encounter (Signed)
Attempted to call patient regarding upcoming cardiac CT appointment. Left detailed message on voicemail with name and callback number Marchia Bond RN Navigator Cardiac [facility] [phone] Office [phone] Cell

## 2021-03-12 ENCOUNTER — Other Ambulatory Visit: Payer: Self-pay

## 2021-03-12 ENCOUNTER — Ambulatory Visit
Admission: RE | Admit: 2021-03-12 | Discharge: 2021-03-12 | Disposition: A | Discharge status: Home / Self Care | Payer: Medicare Other | Source: Ambulatory Visit | Attending: Cardiology | Admitting: Cardiology

## 2021-03-12 DIAGNOSIS — R072 Precordial pain: Secondary | ICD-10-CM | POA: Insufficient documentation

## 2021-03-12 MED ORDER — IOHEXOL 350 MG/ML SOLN
90.0000 mL | Freq: Once | INTRAVENOUS | Status: AC | PRN
  Administered 2021-03-12: 90 mL via INTRAVENOUS

## 2021-03-12 MED ORDER — METOPROLOL TARTRATE 5 MG/5ML IV SOLN
10.0000 mg | Freq: Once | INTRAVENOUS | Status: AC
  Administered 2021-03-12: 10 mg via INTRAVENOUS

## 2021-03-12 MED ORDER — NITROGLYCERIN 0.4 MG SL SUBL
0.8000 mg | SUBLINGUAL_TABLET | Freq: Once | SUBLINGUAL | Status: AC
  Administered 2021-03-12: 0.8 mg via SUBLINGUAL

## 2021-03-12 NOTE — Progress Notes (Signed)
Patient tolerated CT well. Drank water after. Vital signs stable encourage to drink water throughout day.Reasons explained and verbalized understanding.  

## 2021-03-14 DIAGNOSIS — R072 Precordial pain: Secondary | ICD-10-CM | POA: Diagnosis not present

## 2021-03-17 DIAGNOSIS — E119 Type 2 diabetes mellitus without complications: Secondary | ICD-10-CM

## 2021-03-19 ENCOUNTER — Other Ambulatory Visit: Payer: Self-pay

## 2021-03-19 ENCOUNTER — Encounter: Payer: Self-pay | Admitting: Rheumatology

## 2021-03-19 ENCOUNTER — Ambulatory Visit: Payer: Medicare Other | Admitting: Rheumatology

## 2021-03-19 VITALS — BP 179/78 | HR 61 | Ht 66.0 in | Wt 216.6 lb

## 2021-03-19 DIAGNOSIS — Z8673 Personal history of transient ischemic attack (TIA), and cerebral infarction without residual deficits: Secondary | ICD-10-CM

## 2021-03-19 DIAGNOSIS — M170 Bilateral primary osteoarthritis of knee: Secondary | ICD-10-CM

## 2021-03-19 DIAGNOSIS — M7061 Trochanteric bursitis, right hip: Secondary | ICD-10-CM | POA: Diagnosis not present

## 2021-03-19 DIAGNOSIS — Z13820 Encounter for screening for osteoporosis: Secondary | ICD-10-CM

## 2021-03-19 DIAGNOSIS — I10 Essential (primary) hypertension: Secondary | ICD-10-CM

## 2021-03-19 DIAGNOSIS — K1379 Other lesions of oral mucosa: Secondary | ICD-10-CM

## 2021-03-19 DIAGNOSIS — Z9889 Other specified postprocedural states: Secondary | ICD-10-CM | POA: Diagnosis not present

## 2021-03-19 DIAGNOSIS — M3509 Sicca syndrome with other organ involvement: Secondary | ICD-10-CM

## 2021-03-19 DIAGNOSIS — M3219 Other organ or system involvement in systemic lupus erythematosus: Secondary | ICD-10-CM

## 2021-03-19 DIAGNOSIS — Z79899 Other long term (current) drug therapy: Secondary | ICD-10-CM | POA: Diagnosis not present

## 2021-03-19 DIAGNOSIS — I7300 Raynaud's syndrome without gangrene: Secondary | ICD-10-CM | POA: Diagnosis not present

## 2021-03-19 DIAGNOSIS — M19041 Primary osteoarthritis, right hand: Secondary | ICD-10-CM | POA: Diagnosis not present

## 2021-03-19 DIAGNOSIS — M25511 Pain in right shoulder: Secondary | ICD-10-CM

## 2021-03-19 DIAGNOSIS — E119 Type 2 diabetes mellitus without complications: Secondary | ICD-10-CM

## 2021-03-19 DIAGNOSIS — I340 Nonrheumatic mitral (valve) insufficiency: Secondary | ICD-10-CM

## 2021-03-19 DIAGNOSIS — E559 Vitamin D deficiency, unspecified: Secondary | ICD-10-CM

## 2021-03-19 DIAGNOSIS — Z87891 Personal history of nicotine dependence: Secondary | ICD-10-CM

## 2021-03-19 DIAGNOSIS — M19042 Primary osteoarthritis, left hand: Secondary | ICD-10-CM

## 2021-03-19 DIAGNOSIS — M797 Fibromyalgia: Secondary | ICD-10-CM | POA: Diagnosis not present

## 2021-03-19 DIAGNOSIS — M7062 Trochanteric bursitis, left hip: Secondary | ICD-10-CM

## 2021-03-19 DIAGNOSIS — G8929 Other chronic pain: Secondary | ICD-10-CM

## 2021-03-19 DIAGNOSIS — Z87448 Personal history of other diseases of urinary system: Secondary | ICD-10-CM

## 2021-03-19 MED ORDER — INSULIN GLARGINE 100 UNIT/ML SOLOSTAR PEN: 40.0000 [IU] | PEN_INJECTOR | Freq: Every day | SUBCUTANEOUS | 3 refills | Status: DC

## 2021-03-19 NOTE — Patient Instructions (Addendum)
Standing Labs We placed an order today for your standing lab work.   Please have your standing labs drawn in May and every 3 months  If possible, please have your labs drawn 2 weeks prior to your appointment so that the provider can discuss your results at your appointment.  We have open lab daily Monday through Thursday from 1:30-4:30 PM and Friday from 1:30-4:00 PM at the office of Dr. Bo Merino, [facility].   Please be advised, all patients with office appointments requiring lab work will take precedents over walk-in lab work.  If possible, please come for your lab work on Monday and Friday afternoons, as you may experience shorter wait times. The office is located at [address] [place], [place], [place] [address] No appointment is necessary.   Labs are drawn by [facility]. Please bring your co-pay at the time of your lab draw.  You may receive a bill from [facility] for your lab work.  If you wish to have your labs drawn at another location, please call the office 24 hours in advance to send orders.  If you have any questions regarding directions or hours of operation,  please call [phone].   As a reminder, please drink plenty of water prior to coming for your lab work. Thanks!   Please use sunscreen SPF 50.  Avoid medications which include omeprazole and sulfa medications.  Avoid use of alfalfa sprouts and figs.  The goal is to keep your vitamin D level around 40.  COVID-19 vaccine recommendations:   COVID-19 vaccine is recommended for everyone (unless you are allergic to a vaccine component), even if you are on a medication that suppresses your immune system.   If you are on Methotrexate, Cellcept (mycophenolate), Rinvoq, Morrie Sheldon, and Olumiant- hold the medication for 1 week after each vaccine. Hold Methotrexate for 2 weeks after the single dose COVID-19 vaccine.   The recommendations are that patients on immunosuppressive medications should receive first  3 COVID-19 vaccines 1 month apart and then a fourth dose (booster) 3 months after the third dose.  Do not take Tylenol or any anti-inflammatory medications (NSAIDs) 24 hours prior to the COVID-19 vaccination.   There is no direct evidence about the efficacy of the COVID-19 vaccine in individuals who are on medications that suppress the immune system.   Even if you are fully vaccinated, and you are on any medications that suppress your immune system, please continue to wear a mask, maintain at least six feet social distance and practice hand hygiene.   If you develop a COVID-19 infection, please contact your PCP or our office to determine if you need monoclonal antibody infusion.  The booster vaccine is now available for immunocompromised patients.   Please see the following web sites for updated information.   [email or web address]  Heart Disease Prevention   Your inflammatory disease increases your risk of heart disease which includes heart attack, stroke, atrial fibrillation (irregular heartbeats), high blood pressure, heart failure and atherosclerosis (plaque in the arteries).  It is important to reduce your risk by:   . Keep blood pressure, cholesterol, and blood sugar at healthy levels   . Smoking Cessation   . Maintain a healthy weight  o BMI 20-25   . Eat a healthy diet  o Plenty of fresh fruit, vegetables, and whole grains  o Limit saturated fats, foods high in sodium, and added sugars  o DASH and Mediterranean diet   . Increase physical activity  o Recommend moderate physically  activity for 150 minutes per week/ 30 minutes a day for five days a week These can be broken up into three separate ten-minute sessions during the day.   . Reduce Stress  . Meditation, slow breathing exercises, yoga, coloring books  . Dental visits twice a year    Shoulder Exercises Ask your health care provider which exercises  are safe for you. Do exercises exactly as told by your health care provider and adjust them as directed. It is normal to feel mild stretching, pulling, tightness, or discomfort as you do these exercises. Stop right away if you feel sudden pain or your pain gets worse. Do not begin these exercises until told by your health care provider. Stretching exercises External rotation and abduction This exercise is sometimes called corner stretch. This exercise rotates your arm outward (external rotation) and moves your arm out from your body (abduction). 1. Stand in a doorway with one of your feet slightly in front of the other. This is called a staggered stance. If you cannot reach your forearms to the door frame, stand facing a corner of a room. 2. Choose one of the following positions as told by your health care provider: ? Place your hands and forearms on the door frame above your head. ? Place your hands and forearms on the door frame at the height of your head. ? Place your hands on the door frame at the height of your elbows. 3. Slowly move your weight onto your front foot until you feel a stretch across your chest and in the front of your shoulders. Keep your head and chest upright and keep your abdominal muscles tight. 4. Hold for __________ seconds. 5. To release the stretch, shift your weight to your back foot. Repeat __________ times. Complete this exercise __________ times a day.   Extension, standing 1. Stand and hold a broomstick, a cane, or a similar object behind your back. ? Your hands should be a little wider than shoulder width apart. ? Your palms should face away from your back. 2. Keeping your elbows straight and your shoulder muscles relaxed, move the stick away from your body until you feel a stretch in your shoulders (extension). ? Avoid shrugging your shoulders while you move the stick. Keep your shoulder blades tucked down toward the middle of your back. 3. Hold for __________  seconds. 4. Slowly return to the starting position. Repeat __________ times. Complete this exercise __________ times a day. Range-of-motion exercises Pendulum 1. Stand near a wall or a surface that you can hold onto for balance. 2. Bend at the waist and let your left / right arm hang straight down. Use your other arm to support you. Keep your back straight and do not lock your knees. 3. Relax your left / right arm and shoulder muscles, and move your hips and your trunk so your left / right arm swings freely. Your arm should swing because of the motion of your body, not because you are using your arm or shoulder muscles. 4. Keep moving your hips and trunk so your arm swings in the following directions, as told by your health care provider: ? Side to side. ? Forward and backward. ? In clockwise and counterclockwise circles. 5. Continue each motion for __________ seconds, or for as long as told by your health care provider. 6. Slowly return to the starting position. Repeat __________ times. Complete this exercise __________ times a day.   Shoulder flexion, standing 1. Stand and hold a broomstick,  a cane, or a similar object. Place your hands a little more than shoulder width apart on the object. Your left / right hand should be palm up, and your other hand should be palm down. 2. Keep your elbow straight and your shoulder muscles relaxed. Push the stick up with your healthy arm to raise your left / right arm in front of your body, and then over your head until you feel a stretch in your shoulder (flexion). ? Avoid shrugging your shoulder while you raise your arm. Keep your shoulder blade tucked down toward the middle of your back. 3. Hold for __________ seconds. 4. Slowly return to the starting position. Repeat __________ times. Complete this exercise __________ times a day.   Shoulder abduction, standing 1. Stand and hold a broomstick, a cane, or a similar object. Place your hands a little more  than shoulder width apart on the object. Your left / right hand should be palm up, and your other hand should be palm down. 2. Keep your elbow straight and your shoulder muscles relaxed. Push the object across your body toward your left / right side. Raise your left / right arm to the side of your body (abduction) until you feel a stretch in your shoulder. ? Do not raise your arm above shoulder height unless your health care provider tells you to do that. ? If directed, raise your arm over your head. ? Avoid shrugging your shoulder while you raise your arm. Keep your shoulder blade tucked down toward the middle of your back. 3. Hold for __________ seconds. 4. Slowly return to the starting position. Repeat __________ times. Complete this exercise __________ times a day. Internal rotation 1. Place your left / right hand behind your back, palm up. 2. Use your other hand to dangle an exercise band, a towel, or a similar object over your shoulder. Grasp the band with your left / right hand so you are holding on to both ends. 3. Gently pull up on the band until you feel a stretch in the front of your left / right shoulder. The movement of your arm toward the center of your body is called internal rotation. ? Avoid shrugging your shoulder while you raise your arm. Keep your shoulder blade tucked down toward the middle of your back. 4. Hold for __________ seconds. 5. Release the stretch by letting go of the band and lowering your hands. Repeat __________ times. Complete this exercise __________ times a day.   Strengthening exercises External rotation 1. Sit in a stable chair without armrests. 2. Secure an exercise band to a stable object at elbow height on your left / right side. 3. Place a soft object, such as a folded towel or a small pillow, between your left / right upper arm and your body to move your elbow about 4 inches (10 cm) away from your side. 4. Hold the end of the exercise band so it is  tight and there is no slack. 5. Keeping your elbow pressed against the soft object, slowly move your forearm out, away from your abdomen (external rotation). Keep your body steady so only your forearm moves. 6. Hold for __________ seconds. 7. Slowly return to the starting position. Repeat __________ times. Complete this exercise __________ times a day.   Shoulder abduction 1. Sit in a stable chair without armrests, or stand up. 2. Hold a __________ weight in your left / right hand, or hold an exercise band with both hands. 3. Start with your  arms straight down and your left / right palm facing in, toward your body. 4. Slowly lift your left / right hand out to your side (abduction). Do not lift your hand above shoulder height unless your health care provider tells you that this is safe. ? Keep your arms straight. ? Avoid shrugging your shoulder while you do this movement. Keep your shoulder blade tucked down toward the middle of your back. 5. Hold for __________ seconds. 6. Slowly lower your arm, and return to the starting position. Repeat __________ times. Complete this exercise __________ times a day.   Shoulder extension 1. Sit in a stable chair without armrests, or stand up. 2. Secure an exercise band to a stable object in front of you so it is at shoulder height. 3. Hold one end of the exercise band in each hand. Your palms should face each other. 4. Straighten your elbows and lift your hands up to shoulder height. 5. Step back, away from the secured end of the exercise band, until the band is tight and there is no slack. 6. Squeeze your shoulder blades together as you pull your hands down to the sides of your thighs (extension). Stop when your hands are straight down by your sides. Do not let your hands go behind your body. 7. Hold for __________ seconds. 8. Slowly return to the starting position. Repeat __________ times. Complete this exercise __________ times a day. Shoulder  row 1. Sit in a stable chair without armrests, or stand up. 2. Secure an exercise band to a stable object in front of you so it is at waist height. 3. Hold one end of the exercise band in each hand. Position your palms so that your thumbs are facing the ceiling (neutral position). 4. Bend each of your elbows to a 90-degree angle (right angle) and keep your upper arms at your sides. 5. Step back until the band is tight and there is no slack. 6. Slowly pull your elbows back behind you. 7. Hold for __________ seconds. 8. Slowly return to the starting position. Repeat __________ times. Complete this exercise __________ times a day. Shoulder press-ups 1. Sit in a stable chair that has armrests. Sit upright, with your feet flat on the floor. 2. Put your hands on the armrests so your elbows are bent and your fingers are pointing forward. Your hands should be about even with the sides of your body. 3. Push down on the armrests and use your arms to lift yourself off the chair. Straighten your elbows and lift yourself up as much as you comfortably can. ? Move your shoulder blades down, and avoid letting your shoulders move up toward your ears. ? Keep your feet on the ground. As you get stronger, your feet should support less of your body weight as you lift yourself up. 4. Hold for __________ seconds. 5. Slowly lower yourself back into the chair. Repeat __________ times. Complete this exercise __________ times a day.   Wall push-ups 1. Stand so you are facing a stable wall. Your feet should be about one arm-length away from the wall. 2. Lean forward and place your palms on the wall at shoulder height. 3. Keep your feet flat on the floor as you bend your elbows and lean forward toward the wall. 4. Hold for __________ seconds. 5. Straighten your elbows to push yourself back to the starting position. Repeat __________ times. Complete this exercise __________ times a day.   This information is not  intended to replace  advice given to you by your health care provider. Make sure you discuss any questions you have with your health care provider. Document Revised: 04/06/2019 Document Reviewed: 01/12/2019 Elsevier Patient Education  2021 [employer].  Hand Exercises Hand exercises can be helpful for almost anyone. These exercises can strengthen the hands, improve flexibility and movement, and increase blood flow to the hands. These results can make work and daily tasks easier. Hand exercises can be especially helpful for people who have joint pain from arthritis or have nerve damage from overuse (carpal tunnel syndrome). These exercises can also help people who have injured a hand. Exercises Most of these hand exercises are gentle stretching and motion exercises. It is usually safe to do them often throughout the day. Warming up your hands before exercise may help to reduce stiffness. You can do this with gentle massage or by placing your hands in warm water for 10-15 minutes. It is normal to feel some stretching, pulling, tightness, or mild discomfort as you begin new exercises. This will gradually improve. Stop an exercise right away if you feel sudden, severe pain or your pain gets worse. Ask your health care provider which exercises are best for you. Knuckle bend or "claw" fist 1. Stand or sit with your arm, hand, and all five fingers pointed straight up. Make sure to keep your wrist straight during the exercise. 2. Gently bend your fingers down toward your palm until the tips of your fingers are touching the top of your palm. Keep your big knuckle straight and just bend the small knuckles in your fingers. 3. Hold this position for __________ seconds. 4. Straighten (extend) your fingers back to the starting position. Repeat this exercise 5-10 times with each hand. Full finger fist 1. Stand or sit with your arm, hand, and all five fingers pointed straight up. Make sure to keep your wrist  straight during the exercise. 2. Gently bend your fingers into your palm until the tips of your fingers are touching the middle of your palm. 3. Hold this position for __________ seconds. 4. Extend your fingers back to the starting position, stretching every joint fully. Repeat this exercise 5-10 times with each hand. Straight fist 1. Stand or sit with your arm, hand, and all five fingers pointed straight up. Make sure to keep your wrist straight during the exercise. 2. Gently bend your fingers at the big knuckle, where your fingers meet your hand, and the middle knuckle. Keep the knuckle at the tips of your fingers straight and try to touch the bottom of your palm. 3. Hold this position for __________ seconds. 4. Extend your fingers back to the starting position, stretching every joint fully. Repeat this exercise 5-10 times with each hand. Tabletop 1. Stand or sit with your arm, hand, and all five fingers pointed straight up. Make sure to keep your wrist straight during the exercise. 2. Gently bend your fingers at the big knuckle, where your fingers meet your hand, as far down as you can while keeping the small knuckles in your fingers straight. Think of forming a tabletop with your fingers. 3. Hold this position for __________ seconds. 4. Extend your fingers back to the starting position, stretching every joint fully. Repeat this exercise 5-10 times with each hand. Finger spread 1. Place your hand flat on a table with your palm facing down. Make sure your wrist stays straight as you do this exercise. 2. Spread your fingers and thumb apart from each other as far as you  can until you feel a gentle stretch. Hold this position for __________ seconds. 3. Bring your fingers and thumb tight together again. Hold this position for __________ seconds. Repeat this exercise 5-10 times with each hand. Making circles 1. Stand or sit with your arm, hand, and all five fingers pointed straight up. Make sure  to keep your wrist straight during the exercise. 2. Make a circle by touching the tip of your thumb to the tip of your index finger. 3. Hold for __________ seconds. Then open your hand wide. 4. Repeat this motion with your thumb and each finger on your hand. Repeat this exercise 5-10 times with each hand. Thumb motion 1. Sit with your forearm resting on a table and your wrist straight. Your thumb should be facing up toward the ceiling. Keep your fingers relaxed as you move your thumb. 2. Lift your thumb up as high as you can toward the ceiling. Hold for __________ seconds. 3. Bend your thumb across your palm as far as you can, reaching the tip of your thumb for the small finger (pinkie) side of your palm. Hold for __________ seconds. Repeat this exercise 5-10 times with each hand. Grip strengthening 1. Hold a stress ball or other soft ball in the middle of your hand. 2. Slowly increase the pressure, squeezing the ball as much as you can without causing pain. Think of bringing the tips of your fingers into the middle of your palm. All of your finger joints should bend when doing this exercise. 3. Hold your squeeze for __________ seconds, then relax. Repeat this exercise 5-10 times with each hand.   Contact a health care provider if:  Your hand pain or discomfort gets much worse when you do an exercise.  Your hand pain or discomfort does not improve within 2 hours after you exercise. If you have any of these problems, stop doing these exercises right away. Do not do them again unless your health care provider says that you can. Get help right away if:  You develop sudden, severe hand pain or swelling. If this happens, stop doing these exercises right away. Do not do them again unless your health care provider says that you can. This information is not intended to replace advice given to you by your health care provider. Make sure you discuss any questions you have with your health care  provider. Document Revised: 04/05/2019 Document Reviewed: 12/14/2018 Elsevier Patient Education  2021 [employer].

## 2021-03-30 DIAGNOSIS — I10 Essential (primary) hypertension: Secondary | ICD-10-CM | POA: Diagnosis not present

## 2021-03-30 DIAGNOSIS — N182 Chronic kidney disease, stage 2 (mild): Secondary | ICD-10-CM | POA: Diagnosis not present

## 2021-03-30 DIAGNOSIS — M329 Systemic lupus erythematosus, unspecified: Secondary | ICD-10-CM | POA: Diagnosis not present

## 2021-04-10 ENCOUNTER — Ambulatory Visit: Payer: Medicare Other | Admitting: Cardiology

## 2021-04-11 ENCOUNTER — Other Ambulatory Visit: Payer: Self-pay | Admitting: Primary Care

## 2021-04-11 DIAGNOSIS — E119 Type 2 diabetes mellitus without complications: Secondary | ICD-10-CM

## 2021-04-13 ENCOUNTER — Other Ambulatory Visit: Payer: Self-pay

## 2021-04-13 DIAGNOSIS — I10 Essential (primary) hypertension: Secondary | ICD-10-CM

## 2021-04-13 MED ORDER — HYDROXYCHLOROQUINE SULFATE 200 MG PO TABS: 200.0000 mg | ORAL_TABLET | Freq: Two times a day (BID) | ORAL | 0 refills | Status: DC

## 2021-04-13 MED ORDER — METHOTREXATE 2.5 MG PO TABS: 20.0000 mg | ORAL_TABLET | ORAL | 2 refills | Status: DC

## 2021-04-13 MED ORDER — AMLODIPINE BESYLATE 10 MG PO TABS: ORAL_TABLET | ORAL | 1 refills | Status: DC

## 2021-04-13 NOTE — Telephone Encounter (Signed)
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

## 2021-04-24 ENCOUNTER — Ambulatory Visit: Payer: Medicare Other | Admitting: Cardiology

## 2021-04-27 ENCOUNTER — Encounter: Payer: Self-pay | Admitting: Cardiology

## 2021-05-19 ENCOUNTER — Ambulatory Visit: Payer: Medicare Other | Admitting: Primary Care

## 2021-06-03 ENCOUNTER — Other Ambulatory Visit: Payer: Self-pay

## 2021-06-03 ENCOUNTER — Encounter: Payer: Self-pay | Admitting: Primary Care

## 2021-06-03 ENCOUNTER — Ambulatory Visit (INDEPENDENT_AMBULATORY_CARE_PROVIDER_SITE_OTHER): Payer: Medicare Other | Admitting: Primary Care

## 2021-06-03 VITALS — BP 158/96 | HR 93 | Temp 97.1°F | Ht 66.0 in | Wt 211.0 lb

## 2021-06-03 DIAGNOSIS — M545 Low back pain, unspecified: Secondary | ICD-10-CM | POA: Diagnosis not present

## 2021-06-03 DIAGNOSIS — F3342 Major depressive disorder, recurrent, in full remission: Secondary | ICD-10-CM

## 2021-06-03 DIAGNOSIS — M3219 Other organ or system involvement in systemic lupus erythematosus: Secondary | ICD-10-CM

## 2021-06-03 DIAGNOSIS — M3500 Sicca syndrome, unspecified: Secondary | ICD-10-CM

## 2021-06-03 DIAGNOSIS — I10 Essential (primary) hypertension: Secondary | ICD-10-CM | POA: Diagnosis not present

## 2021-06-03 DIAGNOSIS — G8929 Other chronic pain: Secondary | ICD-10-CM

## 2021-06-03 DIAGNOSIS — F419 Anxiety disorder, unspecified: Secondary | ICD-10-CM

## 2021-06-03 DIAGNOSIS — E119 Type 2 diabetes mellitus without complications: Secondary | ICD-10-CM | POA: Diagnosis not present

## 2021-06-03 LAB — POCT GLYCOSYLATED HEMOGLOBIN (HGB A1C): Hemoglobin A1C: 10.4 % — AB (ref 4.0–5.6)

## 2021-06-03 MED ORDER — VENLAFAXINE HCL ER 37.5 MG PO CP24: 37.5000 mg | ORAL_CAPSULE | Freq: Every day | ORAL | 0 refills | Status: DC

## 2021-06-03 MED ORDER — OZEMPIC (0.25 OR 0.5 MG/DOSE) 2 MG/1.5ML ~~LOC~~ SOPN: 0.5000 mg | PEN_INJECTOR | SUBCUTANEOUS | 0 refills | Status: DC

## 2021-06-03 NOTE — Assessment & Plan Note (Signed)
Follows with rheumatology, continue current regimen.

## 2021-06-03 NOTE — Assessment & Plan Note (Signed)
No longer feels that anxiety is controlled on Lexapro 20 mg. Is compliant to Wellbutrin XL 150 mg.  Discussed options, we decided to wean off of Lexapro 20 mg, start venlafaxine ER 37.5 mg. She will update.

## 2021-06-03 NOTE — Assessment & Plan Note (Signed)
Above goal in the office today, is working with nephrology to gain better control.   Continue amlodipine 10 mg, metoprolol succinate 50 mg, spironolactone 25 mg, telmisartan-HCTZ 80-25 mg.

## 2021-06-03 NOTE — Assessment & Plan Note (Signed)
Remains uncontrolled with A1C of 10.4 today.  Cannot tolerate Metformin, insurance would not cover Trulicity. Compliant to Lantus 40 units.  Will try to see if Ozempic is covered, Rx for 0.5 mg weekly sent to pharmacy. Increase Lantus to 45 units. Consider adding meal coverage insulin.   She will update in 1 week.  Follow up in 3 months.

## 2021-06-03 NOTE — Assessment & Plan Note (Addendum)
No longer feels that anxiety is controlled on Lexapro 20 mg. Is compliant to Wellbutrin XL 150 mg.  Discussed options, we decided to wean off of Lexapro 20 mg, start venlafaxine ER 37.5 mg. She will update.

## 2021-06-03 NOTE — Patient Instructions (Signed)
Reduce your Lexapro to 1/2 tablet daily for about one week, then stop.  Start venlafaxine (Effexor) ER 37.5 mg daily for anxiety and depression.   Increase your Lantus to 45 units nightly for one week, then to 50 units if your sugars remain above 150. Please update me after one week.  Start semaglutide (Ozempic) 0.5 mg once weekly for diabetes. This is an injection.   Please schedule a follow up appointment in 3 months for diabetes check.  It was a pleasure to see you today!

## 2021-06-03 NOTE — Assessment & Plan Note (Signed)
Follows with rheumatology, continue Plaquenil 200 mg, methotrexate.

## 2021-06-03 NOTE — Assessment & Plan Note (Signed)
Has been referred to pain management, initial visit scheduled for a few weeks. Also follows with spine specialist through [facility].

## 2021-06-03 NOTE — Progress Notes (Signed)
Subjective:    Patient ID: Lauren Winters, female    DOB: [birth year], 55 y.o.   MRN: [id number]  HPI  Lauren Winters is a very pleasant 55 y.o. female with a history of type 2 diabetes, hypertension, osteoarthritis, sjogren's syndrome, CVA, SLE, fibromyalgia who presents today for follow up of diabetes, chronic conditions, and to discuss anxiety and depression.  1) Anxiety and Depression: Chronic symptoms of depression and anxiety, is currently managed on Lexapro 20 mg, Wellbutrin XL 150 mg, doesn't feel that anxiety and depression is well controlled. Symptoms include little motivation do to things, frustrated with her chronic pain, doesn't want to leave her home.   She was once managed on Cymbalta did well, then it became ineffective. She was once on Zoloft but doesn't recall why she came off. She is open to trying something new.  2) Type 2 Diabetes: Current medications include: Metformin XR 500 mg and Lantus 40 units HS. She cannot tolerate the metformin due to nausea and GI upset. Her insurance would not cover Trulicity.   She is checking her blood glucose several times daily and is getting readings averaging at 330. She mostly sees readings of upper 200's and low 300's.   She endorses an improved diet, is not exercising due to chronic back pain. She is under a lot of stress.   Last A1C: 9.4 in February 2022, 10.4 today Last Eye Exam: UTD Last Foot Exam: Due Pneumonia Vaccination: UTD ACE/ARB: ARB Statin: Crestor    BP Readings from Last 3 Encounters:  06/03/21 (!) 158/96  03/19/21 (!) 179/78  03/12/21 126/73      Review of Systems  Respiratory: Negative for shortness of breath.   Cardiovascular: Negative for chest pain.  Musculoskeletal: Positive for arthralgias.  Psychiatric/Behavioral: The patient is nervous/anxious.        See HPI         Past Medical History:  Diagnosis Date  . Anxiety   . Atypical chest pain    a. ETT 05/2014: normal stress test without  evidence of myocardial ischemia or chest pain at peak stress  . Chronic kidney disease   . Complication of anesthesia    migraine headache  . Depression   . Diabetes mellitus without complication ([place]) metformin   dx 2015  . Dyspnea    on exertion  . Fibromyalgia   . Headache   . Heart murmur    told back in 2007, but no mention with 2016 cardiac w/u  . Hyperlipidemia   . Hypertension   . Lupus (systemic lupus erythematosus) ([place])   . Mitral regurgitation    a. 05/2014 echo: EF >55%, nl LV/RV sys fxn, mild MR/AR, no valvular stenosis   . Obesity (BMI 30-39.9)   . Raynaud's phenomenon   . Rheumatoid arthritis ([place])   . S/P lumbar laminectomy 12/09/2017  . S/P lumbar spinal fusion 05/26/2018  . Sjogren's disease ([place])   . Spondylolisthesis of lumbar region   . Stroke ([place])   . Tobacco abuse    a. ongoing; b. ongoing SOB  . Wound infection after surgery 01/11/2018    Social History   Socioeconomic History  . Marital status: Married    Spouse name: Not on file  . Number of children: Not on file  . Years of education: Not on file  . Highest education level: Not on file  Occupational History  . Not on file  Tobacco Use  . Smoking status: Former Smoker    Packs/day:  0.50    Years: 10.00    Pack years: 5.00    Types: Cigarettes    Quit date: 07/22/2015    Years since quitting: 5.8  . Smokeless tobacco: Never Used  Vaping Use  . Vaping Use: Never used  Substance and Sexual Activity  . Alcohol use: No    Alcohol/week: 0.0 standard drinks  . Drug use: No  . Sexual activity: Not Currently  Other Topics Concern  . Not on file  Social History Narrative   Married.   3 children.   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    Social Determinants of Health   Financial Resource Strain: Not on file  Food Insecurity: Not on file  Transportation Needs: Not on file  Physical Activity: Not on file  Stress: Not on file  Social Connections:  Not on file  Intimate Partner Violence: Not on file    Past Surgical History:  Procedure Laterality Date  . ABDOMINAL HYSTERECTOMY    . BACK SURGERY    . BONE MARROW BIOPSY  10/2019  . CESAREAN SECTION     x3  . LUMBAR LAMINECTOMY    . LUMBAR LAMINECTOMY/DECOMPRESSION MICRODISCECTOMY N/A 03/29/2017   Procedure: Re operative Right Lumbar Three-Four Laminectomy and discectomy with Left Lumbar Three-Four, Bilateral Lumbar Four-Five Laminectomy for decompression;  Surgeon: Kevan Ny Ditty, MD;  Location: [facility];  Service: Neurosurgery;  Laterality: N/A;  . SPINAL CORD STIMULATOR INSERTION  12/09/2017   Procedure: LUMBAR SPINAL CORD STIMULATOR IMPLANT;  Surgeon: Eustace Moore, MD;  Location: [facility];  Service: Neurosurgery;;  . SPINAL CORD STIMULATOR REMOVAL N/A 01/11/2018   Procedure: LUMBAR SPINAL CORD STIMULATOR REMOVAL;  Surgeon: Eustace Moore, MD;  Location: [facility];  Service: Neurosurgery;  Laterality: N/A;  . TUBAL LIGATION      Family History  Problem Relation Age of Onset  . CAD Father 30       CABG x 4  . Liver cancer Father 20       passed  . Valvular heart disease Father   . Hypertension Father   . CAD Paternal Aunt        CABG  . Breast cancer Paternal Aunt   . CAD Paternal Aunt        CABG  . Breast cancer Paternal Aunt   . CAD Paternal Aunt        CABG  . Breast cancer Paternal Aunt   . CAD Paternal Aunt        CABG  . Breast cancer Paternal Aunt   . CAD Paternal Aunt        CABG  . Breast cancer Paternal Aunt   . CAD Paternal Uncle        MI s/p CABG  . Stroke Maternal Grandmother   . Stroke Maternal Grandfather   . CAD Maternal Grandfather   . Heart Problems Brother     Allergies  Allergen Reactions  . Pseudoephedrine Hcl Shortness Of Breath and Other (See Comments)    Room starts spinning, can't breath  . Ancef [Cefazolin] Other (See Comments)    Unknown  . Clindamycin/Lincomycin Hives  . Prednisone Other (See Comments)    Chest pain w/  cortisone shots or prednisone pills Elevated blood pressure  . Triprolidine Hcl   . Adhesive [Tape] Rash and Other (See Comments)    Sensitivity to certain band aids and adhesives - burns skin, makes skin raw  . Other Rash and Other (  See Comments)    Sensitivity to certain band aids and adhesives - burns skin, makes skin raw  . Sudafed [Pseudoephedrine Hcl] Other (See Comments)    Dizziness     Current Outpatient Medications on File Prior to Visit  Medication Sig Dispense Refill  . amLODipine (NORVASC) 10 MG tablet Take one tab daily for blood pressure. 90 tablet 1  . Blood Glucose Monitoring Suppl (ONETOUCH VERIO FLEX SYSTEM) w/Device KIT Use as instructed to test blood sugar 3 times daily 1 kit 0  . buPROPion (WELLBUTRIN XL) 150 MG 24 hr tablet TAKE 1 TABLET BY MOUTH ONCE DAILY FOR ANXIETY AND DEPRESSION. 90 tablet 1  . clopidogrel (PLAVIX) 75 MG tablet TAKE 1 TABLET BY MOUTH EVERY DAY 90 tablet 3  . Continuous Blood Gluc Receiver (FREESTYLE LIBRE 14 DAY READER) DEVI 1 Device by Does not apply route in the morning, at noon, and at bedtime. 1 each 0  . Continuous Blood Gluc Sensor (FREESTYLE LIBRE 14 DAY SENSOR) MISC USE AS DIRECTED CHANGE SENSOR EVERY 14 DAYS *NOT COVERRED BY INS* 6 each 3  . cyclobenzaprine (FLEXERIL) 10 MG tablet TAKE 1 TABLET BY MOUTH AT BEDTIME AS NEEDED FOR MUSCLE SPASMS 90 tablet 0  . escitalopram (LEXAPRO) 20 MG tablet Take 1 tablet (20 mg total) by mouth daily. For anxiety and depression. 90 tablet 1  . folic acid (FOLVITE) 1 MG tablet Take 2 tablets by mouth daily. 180 tablet 1  . glucose blood test strip Use as instructed to blood sugar 3 times daily 300 each 2  . hydroxychloroquine (PLAQUENIL) 200 MG tablet Take 1 tablet (200 mg total) by mouth 2 (two) times daily. 180 tablet 0  . insulin glargine (LANTUS) 100 UNIT/ML Solostar Pen Inject 40 Units into the skin at bedtime. 30 mL 3  . Insulin Pen Needle (PEN NEEDLES) 31G X 6 MM MISC Use with insulin as directed.  100 each 0  . metFORMIN (GLUCOPHAGE-XR) 500 MG 24 hr tablet TAKE 1 TABLET EVERY MORNING WITH FOOD FOR DIABETES. 90 tablet 1  . methotrexate (RHEUMATREX) 2.5 MG tablet Take 8 tablets (20 mg total) by mouth once a week. Caution:Chemotherapy. Protect from light. 32 tablet 2  . rosuvastatin (CRESTOR) 20 MG tablet Take 1 tablet (20 mg total) by mouth daily. For cholesterol 90 tablet 3  . spironolactone (ALDACTONE) 25 MG tablet Take 25 mg by mouth daily.    . metoprolol succinate (TOPROL-XL) 50 MG 24 hr tablet Take 1 tablet (50 mg total) by mouth daily. For blood pressure 90 tablet 3  . telmisartan-hydrochlorothiazide (MICARDIS HCT) 80-25 MG tablet Take by mouth.     No current facility-administered medications on file prior to visit.    BP (!) 158/96   Pulse 93   Temp (!) 97.1 F (36.2 C) (Temporal)   Ht $R'5\' 6"'HZ$  (1.676 m)   Wt 211 lb (95.7 kg)   SpO2 97%   BMI 34.06 kg/m  Objective:   Physical Exam Cardiovascular:     Rate and Rhythm: Normal rate and regular rhythm.  Pulmonary:     Effort: Pulmonary effort is normal.     Breath sounds: Normal breath sounds.  Musculoskeletal:     Cervical back: Neck supple.  Skin:    General: Skin is warm and dry.           Assessment & Plan:      This visit occurred during the SARS-CoV-2 public health emergency.  Safety protocols were in place, including screening questions prior  to the visit, additional usage of staff PPE, and extensive cleaning of exam room while observing appropriate contact time as indicated for disinfecting solutions.

## 2021-06-04 ENCOUNTER — Other Ambulatory Visit: Payer: Medicare Other

## 2021-06-09 DIAGNOSIS — E119 Type 2 diabetes mellitus without complications: Secondary | ICD-10-CM

## 2021-06-09 MED ORDER — INSULIN GLARGINE 100 UNIT/ML SOLOSTAR PEN: 45.0000 [IU] | PEN_INJECTOR | Freq: Every day | SUBCUTANEOUS | 3 refills | Status: DC

## 2021-06-09 NOTE — Telephone Encounter (Signed)
Spoke with Eddie Dibbles at [facility] to get more information on the insulin issue. [facility] can not split boxes of insulin so with directions for Lantus stating 40 units daily it puts patient at 37 day supply because they have to dispense at least 15 ml-can not do less. Her insurance charges her double co pay for the medication because it is over 30 day supply.   The 2 options for the patient would be 1) go to another pharmacy that can break up the supplies or 2) directions can be changed-looks like per LOV it was mentioned to increase Lantus to 45 units and after 1 week possibly go to 50 units.  Per [facility]bles at [facility] we can send in Lantus with new directions stating inject up to 50 units daily 15 ml-30 day supply and then patient will be within that 30 day need and not go over and pay 1 co pay. If this does not make sense let me know. Thank you.

## 2021-06-10 ENCOUNTER — Ambulatory Visit
Admission: RE | Admit: 2021-06-10 | Discharge: 2021-06-10 | Disposition: A | Discharge status: Home / Self Care | Payer: Medicare Other | Attending: Pain Medicine | Admitting: Pain Medicine

## 2021-06-10 ENCOUNTER — Other Ambulatory Visit: Payer: Self-pay | Admitting: Pain Medicine

## 2021-06-10 ENCOUNTER — Other Ambulatory Visit: Payer: Self-pay

## 2021-06-10 ENCOUNTER — Ambulatory Visit
Admission: RE | Admit: 2021-06-10 | Discharge: 2021-06-10 | Disposition: A | Discharge status: Home / Self Care | Payer: Medicare Other | Source: Ambulatory Visit | Attending: Pain Medicine | Admitting: Pain Medicine

## 2021-06-10 ENCOUNTER — Encounter: Payer: Self-pay | Admitting: Pain Medicine

## 2021-06-10 ENCOUNTER — Ambulatory Visit: Payer: Medicare Other | Admitting: Pain Medicine

## 2021-06-10 ENCOUNTER — Other Ambulatory Visit
Admission: RE | Admit: 2021-06-10 | Discharge: 2021-06-10 | Disposition: A | Discharge status: Home / Self Care | Payer: Medicare Other | Source: Home / Self Care | Attending: Pain Medicine | Admitting: Pain Medicine

## 2021-06-10 VITALS — BP 185/83 | HR 92 | Temp 96.8°F | Ht 66.0 in | Wt 211.0 lb

## 2021-06-10 DIAGNOSIS — R7989 Other specified abnormal findings of blood chemistry: Secondary | ICD-10-CM | POA: Insufficient documentation

## 2021-06-10 DIAGNOSIS — E559 Vitamin D deficiency, unspecified: Secondary | ICD-10-CM

## 2021-06-10 DIAGNOSIS — M419 Scoliosis, unspecified: Secondary | ICD-10-CM | POA: Diagnosis not present

## 2021-06-10 DIAGNOSIS — R7982 Elevated C-reactive protein (CRP): Secondary | ICD-10-CM

## 2021-06-10 DIAGNOSIS — R0781 Pleurodynia: Secondary | ICD-10-CM | POA: Insufficient documentation

## 2021-06-10 DIAGNOSIS — M79601 Pain in right arm: Secondary | ICD-10-CM | POA: Insufficient documentation

## 2021-06-10 DIAGNOSIS — G8929 Other chronic pain: Secondary | ICD-10-CM

## 2021-06-10 DIAGNOSIS — M5136 Other intervertebral disc degeneration, lumbar region: Secondary | ICD-10-CM

## 2021-06-10 DIAGNOSIS — M542 Cervicalgia: Secondary | ICD-10-CM

## 2021-06-10 DIAGNOSIS — R700 Elevated erythrocyte sedimentation rate: Secondary | ICD-10-CM

## 2021-06-10 DIAGNOSIS — M5134 Other intervertebral disc degeneration, thoracic region: Secondary | ICD-10-CM | POA: Insufficient documentation

## 2021-06-10 DIAGNOSIS — M3500 Sicca syndrome, unspecified: Secondary | ICD-10-CM

## 2021-06-10 DIAGNOSIS — Z981 Arthrodesis status: Secondary | ICD-10-CM | POA: Diagnosis not present

## 2021-06-10 DIAGNOSIS — M25511 Pain in right shoulder: Secondary | ICD-10-CM | POA: Insufficient documentation

## 2021-06-10 DIAGNOSIS — M79604 Pain in right leg: Secondary | ICD-10-CM | POA: Insufficient documentation

## 2021-06-10 DIAGNOSIS — M48061 Spinal stenosis, lumbar region without neurogenic claudication: Secondary | ICD-10-CM | POA: Diagnosis not present

## 2021-06-10 DIAGNOSIS — M0579 Rheumatoid arthritis with rheumatoid factor of multiple sites without organ or systems involvement: Secondary | ICD-10-CM | POA: Insufficient documentation

## 2021-06-10 DIAGNOSIS — Z789 Other specified health status: Secondary | ICD-10-CM

## 2021-06-10 DIAGNOSIS — E1142 Type 2 diabetes mellitus with diabetic polyneuropathy: Secondary | ICD-10-CM | POA: Insufficient documentation

## 2021-06-10 DIAGNOSIS — Z7901 Long term (current) use of anticoagulants: Secondary | ICD-10-CM

## 2021-06-10 DIAGNOSIS — M5414 Radiculopathy, thoracic region: Secondary | ICD-10-CM

## 2021-06-10 DIAGNOSIS — M25552 Pain in left hip: Secondary | ICD-10-CM

## 2021-06-10 DIAGNOSIS — M899 Disorder of bone, unspecified: Secondary | ICD-10-CM | POA: Insufficient documentation

## 2021-06-10 DIAGNOSIS — M797 Fibromyalgia: Secondary | ICD-10-CM | POA: Insufficient documentation

## 2021-06-10 DIAGNOSIS — M25551 Pain in right hip: Secondary | ICD-10-CM | POA: Insufficient documentation

## 2021-06-10 DIAGNOSIS — M51369 Other intervertebral disc degeneration, lumbar region without mention of lumbar back pain or lower extremity pain: Secondary | ICD-10-CM

## 2021-06-10 DIAGNOSIS — M549 Dorsalgia, unspecified: Secondary | ICD-10-CM | POA: Insufficient documentation

## 2021-06-10 DIAGNOSIS — M546 Pain in thoracic spine: Secondary | ICD-10-CM | POA: Diagnosis not present

## 2021-06-10 DIAGNOSIS — R768 Other specified abnormal immunological findings in serum: Secondary | ICD-10-CM | POA: Insufficient documentation

## 2021-06-10 DIAGNOSIS — Z967 Presence of other bone and tendon implants: Secondary | ICD-10-CM | POA: Insufficient documentation

## 2021-06-10 DIAGNOSIS — M329 Systemic lupus erythematosus, unspecified: Secondary | ICD-10-CM

## 2021-06-10 DIAGNOSIS — IMO0002 Reserved for concepts with insufficient information to code with codable children: Secondary | ICD-10-CM

## 2021-06-10 DIAGNOSIS — M961 Postlaminectomy syndrome, not elsewhere classified: Secondary | ICD-10-CM | POA: Insufficient documentation

## 2021-06-10 DIAGNOSIS — M4125 Other idiopathic scoliosis, thoracolumbar region: Secondary | ICD-10-CM | POA: Insufficient documentation

## 2021-06-10 DIAGNOSIS — M50322 Other cervical disc degeneration at C5-C6 level: Secondary | ICD-10-CM | POA: Diagnosis not present

## 2021-06-10 DIAGNOSIS — Z9889 Other specified postprocedural states: Secondary | ICD-10-CM | POA: Diagnosis not present

## 2021-06-10 DIAGNOSIS — T85733S Infection and inflammatory reaction due to implanted electronic neurostimulator of spinal cord, electrode (lead), sequela: Secondary | ICD-10-CM

## 2021-06-10 DIAGNOSIS — R7689 Other specified abnormal immunological findings in serum: Secondary | ICD-10-CM | POA: Insufficient documentation

## 2021-06-10 DIAGNOSIS — M1611 Unilateral primary osteoarthritis, right hip: Secondary | ICD-10-CM

## 2021-06-10 DIAGNOSIS — R079 Chest pain, unspecified: Secondary | ICD-10-CM | POA: Diagnosis not present

## 2021-06-10 DIAGNOSIS — G894 Chronic pain syndrome: Secondary | ICD-10-CM | POA: Insufficient documentation

## 2021-06-10 DIAGNOSIS — M545 Low back pain, unspecified: Secondary | ICD-10-CM | POA: Insufficient documentation

## 2021-06-10 DIAGNOSIS — Q765 Cervical rib: Secondary | ICD-10-CM | POA: Diagnosis not present

## 2021-06-10 DIAGNOSIS — M5030 Other cervical disc degeneration, unspecified cervical region: Secondary | ICD-10-CM

## 2021-06-10 DIAGNOSIS — R7309 Other abnormal glucose: Secondary | ICD-10-CM | POA: Insufficient documentation

## 2021-06-10 DIAGNOSIS — Z79899 Other long term (current) drug therapy: Secondary | ICD-10-CM | POA: Insufficient documentation

## 2021-06-10 HISTORY — DX: Other specified abnormal immunological findings in serum: R76.8

## 2021-06-10 HISTORY — DX: Long term (current) use of anticoagulants: Z79.01

## 2021-06-10 HISTORY — DX: Other specified abnormal findings of blood chemistry: R79.89

## 2021-06-10 LAB — COMPREHENSIVE METABOLIC PANEL
ALT: 13 U/L (ref 0–44)
AST: 19 U/L (ref 15–41)
Albumin: 4.4 g/dL (ref 3.5–5.0)
Alkaline Phosphatase: 102 U/L (ref 38–126)
Anion gap: 11 (ref 5–15)
BUN: 18 mg/dL (ref 6–20)
CO2: 26 mmol/L (ref 22–32)
Calcium: 9.7 mg/dL (ref 8.9–10.3)
Chloride: 98 mmol/L (ref 98–111)
Creatinine, Ser: 0.82 mg/dL (ref 0.44–1.00)
GFR, Estimated: >60 mL/min (ref >60)
Glucose, Bld: 416 mg/dL — ABNORMAL HIGH (ref 70–99)
Potassium: 4 mmol/L (ref 3.5–5.1)
Sodium: 135 mmol/L (ref 135–145)
Total Bilirubin: 0.9 mg/dL (ref 0.3–1.2)
Total Protein: 9 g/dL — ABNORMAL HIGH (ref 6.5–8.1)

## 2021-06-10 LAB — PROTIME-INR
INR: 1 (ref 0.8–1.2)
Prothrombin Time: 13.2 seconds (ref 11.4–15.2)

## 2021-06-10 LAB — SEDIMENTATION RATE: Sed Rate: 36 mm/hr — ABNORMAL HIGH (ref 0–30)

## 2021-06-10 LAB — VITAMIN B12: Vitamin B-12: 226 pg/mL (ref 180–914)

## 2021-06-10 LAB — MAGNESIUM: Magnesium: 1.9 mg/dL (ref 1.7–2.4)

## 2021-06-10 LAB — VITAMIN D 25 HYDROXY (VIT D DEFICIENCY, FRACTURES): Vit D, 25-Hydroxy: 14.79 ng/mL — ABNORMAL LOW (ref 30–100)

## 2021-06-10 LAB — PLATELET COUNT: Platelets: 386 10*3/uL (ref 150–400)

## 2021-06-10 LAB — C-REACTIVE PROTEIN: CRP: 1.3 mg/dL — ABNORMAL HIGH (ref <1.0)

## 2021-06-10 LAB — APTT: aPTT: 30 seconds (ref 24–36)

## 2021-06-10 NOTE — Patient Instructions (Signed)
____________________________________________________________________________________________  General Risks and Possible Complications  Patient Responsibilities: It is important that you read this as it is part of your informed consent. It is our duty to inform you of the risks and possible complications associated with treatments offered to you. It is your responsibility as a patient to read this and to ask questions about anything that is not clear or that you believe was not covered in this document.  Patient's Rights: You have the right to refuse treatment. You also have the right to change your mind, even after initially having agreed to have the treatment done. However, under this last option, if you wait until the last second to change your mind, you may be charged for the materials used up to that point.  Introduction: Medicine is not an exact science. Everything in Medicine, including the lack of treatment(s), carries the potential for danger, harm, or loss (which is by definition: Risk). In Medicine, a complication is a secondary problem, condition, or disease that can aggravate an already existing one. All treatments carry the risk of possible complications. The fact that a side effects or complications occurs, does not imply that the treatment was conducted incorrectly. It must be clearly understood that these can happen even when everything is done following the highest safety standards.  No treatment: You can choose not to proceed with the proposed treatment alternative. The "PRO(s)" would include: avoiding the risk of complications associated with the therapy. The "CON(s)" would include: not getting any of the treatment benefits. These benefits fall under one of three categories: diagnostic; therapeutic; and/or palliative. Diagnostic benefits include: getting information which can ultimately lead to improvement of the disease or symptom(s). Therapeutic benefits are those associated with the  successful treatment of the disease. Finally, palliative benefits are those related to the decrease of the primary symptoms, without necessarily curing the condition (example: decreasing the pain from a flare-up of a chronic condition, such as incurable terminal cancer).  General Risks and Complications: These are associated to most interventional treatments. They can occur alone, or in combination. They fall under one of the following six (6) categories: no benefit or worsening of symptoms; bleeding; infection; nerve damage; allergic reactions; and/or death. No benefits or worsening of symptoms: In Medicine there are no guarantees, only probabilities. No healthcare provider can ever guarantee that a medical treatment will work, they can only state the probability that it may. Furthermore, there is always the possibility that the condition may worsen, either directly, or indirectly, as a consequence of the treatment. Bleeding: This is more common if the patient is taking a blood thinner, either prescription or over the counter (example: Goody Powders, Fish oil, Aspirin, Garlic, etc.), or if suffering a condition associated with impaired coagulation (example: Hemophilia, cirrhosis of the liver, low platelet counts, etc.). However, even if you do not have one on these, it can still happen. If you have any of these conditions, or take one of these drugs, make sure to notify your treating physician. Infection: This is more common in patients with a compromised immune system, either due to disease (example: diabetes, cancer, human immunodeficiency virus [HIV], etc.), or due to medications or treatments (example: therapies used to treat cancer and rheumatological diseases). However, even if you do not have one on these, it can still happen. If you have any of these conditions, or take one of these drugs, make sure to notify your treating physician. Nerve Damage: This is more common when the treatment is an invasive    invasive one, but it can also happen with the use of medications, such as those used in the treatment of cancer. The damage can occur to small secondary nerves, or to large primary ones, such as those in the spinal cord and brain. This damage may be temporary or permanent and it may lead to impairments that can range from temporary numbness to permanent paralysis and/or brain death. Allergic Reactions: Any time a substance or material comes in contact with our body, there is the possibility of an allergic reaction. These can range from a mild skin rash (contact dermatitis) to a severe systemic reaction (anaphylactic reaction), which can result in death. Death: In general, any medical intervention can result in death, most of the time due to an unforeseen complication. ____________________________________________________________________________________________    

## 2021-06-10 NOTE — Progress Notes (Signed)
Patient: Lauren Winters  Service Category: E/M  Provider: Gaspar Cola, MD  DOB: 13-Jul-1966  DOS: 06/10/2021  Referring Provider: Kaleen Mask, NP  MRN: [id number]  Setting: Ambulatory outpatient  PCP: Pleas Koch, NP  Type: New Patient  Specialty: Interventional Pain Management    Location: Office  Delivery: Face-to-face     Primary Reason(s) for Visit: Encounter for initial evaluation of one or more chronic problems (new to examiner) potentially causing chronic pain, and posing a threat to normal musculoskeletal function. (Level of risk: High) CC: Back Pain  HPI  Lauren Winters is a 55 y.o. year old, female patient, who comes for the first time to our practice referred by Kaleen Mask, NP for our initial evaluation of her chronic pain. She has Mitral regurgitation; Atypical chest pain; Obesity (BMI 30-39.9); Anxiety; Essential hypertension; 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[place]); Lupus ([place]); Rheumatoid factor positive; Sjogren's syndrome ([place]); Hyperlipidemia; Chronic mid back pain (5th area of Pain) (Bilateral); Depression; Chest pain; Preventative health care; Primary osteoarthritis of both hands; Primary osteoarthritis of both knees; Spondylosis of lumbar region without myelopathy or radiculopathy; Fibromyalgia; Chronic shoulder pain (Right); HNP (herniated nucleus pulposus), lumbar; High risk medication use; History of lumbar spinal fusion (L3-4, L4-5); History of stroke; Paresthesias; Monoclonal gammopathy; Adenopathy, cervical; Cough; Herniation of nucleus pulposus of cervical intervertebral disc without myelopathy; Disorder of sacroiliac joint; Trochanteric bursitis; Chronic hand pain (Bilateral); Bilateral primary osteoarthritis of hip; Displacement of lumbar intervertebral disc without myelopathy; Headache disorder; Left arm weakness; Local infection of wound; Lumbar post-laminectomy syndrome (L3-4, L4-5); Lumbosacral spondylosis with radiculopathy; Stage 3  chronic kidney disease ([place]); Chronic hip pain (2ry area of Pain) (Bilateral); Failed back surgical syndrome; Chronic low back pain (1ry area of Pain) (Bilateral) (R>L) w/o sciatica; Rheumatoid arthritis involving multiple sites with positive rheumatoid factor ([place]); Numbness and tingling in left arm; Chronic pain syndrome; Pharmacologic therapy; Disorder of skeletal system; Problems influencing health status; Elevated hemoglobin A1c; Vitamin D deficiency; Positive ANA (antinuclear antibody); Elevated rheumatoid factor; Elevated d-dimer; Chronic anticoagulation (Plavix); Chronic lower extremity pain (3ry area of Pain) (Right); Diabetic peripheral neuropathy (4th area of Pain) ([place]); Chronic, intermittent, rib pain (8th area of Pain) (Bilateral); Thoracic radiculitis (T6) (intermittent) (Bilateral); Chronic neck pain (6th area of Pain) (posterior) (Midline) (R>L); Cervicalgia; Chronic upper extremity pain (7th area of Pain) (Right); History of infection of neurostimulator battery w/ removal of device; Elevated C-reactive protein (CRP); Elevated sed rate; DDD (degenerative disc disease), thoracic; DDD (degenerative disc disease), cervical; Osteoarthritis of hip (Right); Other idiopathic scoliosis, thoracolumbar region; and Fixation hardware in spine (L3-4 and L4-5) on their problem list. Today she comes in for evaluation of her Back Pain  Pain Assessment: Location: Right, Left, Lower Back (both hip, neck, right leg) Radiating: pain radiaties down back to both hips and in front of right leg, pain radiaties down neck to her right shoulder Onset: More than a month ago Duration: Chronic pain Quality: Radiating, Aching, Burning, Constant, Shooting, Stabbing, Throbbing, Nagging, Sharp, Discomfort Severity: 7 /10 (subjective, self-reported pain score)  Effect on ADL: limits my daily activities Timing: Constant Modifying factors: meds, ice, heat, laying down BP: (!) 185/83  HR: 92  Onset and Duration:  Gradual Cause of pain: Unknown Severity: Getting worse, NAS-11 at its worse: 8/10, NAS-11 at its best: 7/10, NAS-11 now: 7/10, and NAS-11 on the average: 8/10 Timing: Not influenced by the time of the day, During activity or exercise, After activity or exercise, and After a period  of immobility Aggravating Factors: Bending, Climbing, Lifiting, Prolonged sitting, Prolonged standing, Squatting, Stooping , Twisting, Walking, Walking uphill, Walking downhill, and Working Alleviating Factors: Cold packs, Lying down, Resting, and Sleeping Associated Problems: Depression, Fatigue, Inability to concentrate, Numbness, Spasms, Sweating, Tingling, Weakness, Pain that wakes patient up, and Pain that does not allow patient to sleep Quality of Pain: Aching, Agonizing, Annoying, Constant, Disabling, Distressing, Exhausting, and Horrible Previous Examinations or Tests: Biopsy, CT scan, Ct-Myelogram, MRI scan, Nerve block, Spinal tap, X-rays, Nerve conduction test, Neurological evaluation, Orthopedic evaluation, and Psychiatric evaluation Previous Treatments: Chiropractic manipulations, Physical Therapy, and Steroid treatments by mouth  According to the patient her primary area of pain is that of the lower back (Bilateral) (R>L).  She has a history positive for 3 different back surgeries performed by [facility].  The first surgery was in 2016 and the last 1 on 2019.  The last surgery was done by Dr. Ronnald Ramp.  Prior to the first surgery her primary symptoms were that of low back pain and right lower extremity pain with the low back pain being worse than the leg pain.  At the time she was experiencing weakness and pain down the right leg to the area of the knee through the anterior aspect of the leg.  After that first surgery she attained 70% relief of the right lower extremity pain and absolutely no relief of the low back pain.  At the time of her second surgery her primary symptom was that of the low  back pain with referral of pain down into both hips with the left hip being worse than the right.  After that surgery the patient indicated having attained no low back pain relief for hip pain relief.  The patient indicated that the second surgery completely failed provide her with any benefits.  At the time of her third surgery she was again experiencing lower extremity pain and low back pain with lower extremity pain being worse than the back pain and this time contrary to the time of her first surgery, at this time the pain was going down the left lower extremity instead of the right.  This pain appears to go to the anterior thigh area.  After the surgery the patient attained 100% relief of the left lower extremity pain but absolutely no benefit from the low back pain.  According to the patient, she had a two-level fusion at L3/L4 (?).  The patient indicates having had physical therapy for this back pain which actually made things a lot worse.  She has had some x-rays and MRIs done in the past.  She also indicates that some of those were last done by Dr. Heide Guile at [facility].  The patient also indicates having had multiple injections in her back done at [facility], where they told her that she was "allergic to steroids".  She indicates that this "allergy" consist of a severe increase in her blood pressure with angina pectoris.  She indicates that this happened every time that she had the steroids injected.  She describes that she was being seen by Dr. Ermalene Postin (?).  In addition to the above lumbar spine surgery she had a bilateral spinal cord stimulator implanted around December 2018 with the trial having been done by Dr. Brien Few and the implant by Dr. Ronnald Ramp.  Unfortunately, she suffered an infection of the battery pocket resulting in the removal of the spinal cord stimulator around January 2019 (4 weeks).  She refers that she was referred  here because her new insurance does not seem to cover Dr. Brien Few  anymore.  The patient's secondary area pain is that of the hips (Bilateral) (R>L).  She denies any surgeries but does admit having had injections done in both hips, also at [facility].  She indicated that they helped for a few weeks.  She also indicates having had physical therapy which did not help.  No recent x-rays of the hips.  The patient's third area of pain is that of the lower extremities (Right).  She indicates currently having no pain on the left side.  She describes the pain as being in the anterior thigh with weakness but no numbness.  She describes having had a nerve conduction test at [facility].  According to the medical records the patient had an EMG done on 10/03/2019 which read:  "Summary of NCS:  After identifying the patient in the waiting room and reviewing all appropriately available medical records, the patient was taken back to the examination room where the procedure was explained, the sites of examination were noted, the patient's questions were answered and the patient's verbal consent for the procedure was obtained. Studies were performed at [facility] using a radiant warmer and CareFusion Synergy EMG System.   Sensory nerve conduction studies of the left median, ulnar, sural, and superficial fibular nerves demonstrate normal peak latencies and normal SNAP amplitudes.   Motor nerve conduction studies of the left median, ulnar, common fibular and tibial nerves demonstrate normal distal motor latencies, normal CMAP amplitudes, and normal conduction velocities. Minimal F-wave latencies are normal in all nerves tested.   Needle EMG of the selected muscles in the left upper and lower extremity show no abnormal spontaneous activity, normal MUP forms, and normal recruitment.   Conclusions:  This is a normal study. These electrodiagnostic findings show no evidence of large fiber polyneuropathy, left arm or left leg compression neuropathy, or left cervical or lumbosacral radiculopathy. Small  fiber neuropathy is not assessed by these techniques."  The patient's fourth area of pain is that of both hands and feet.  She describes that this is secondary to her diabetic peripheral neuropathy.  She describes having numbness and tingling in both hands and feet.  The patient's fifth area pain is that of the mid upper back (Bilateral) (R=L).  She denies any surgeries, injections, or physical therapy.  She indicates that she may have had some x-rays done by Dr. Tally Joe at [facility].  The patient has sixth area pain is that of the neck and the posterior aspect with the pain being primarily midline but radiating towards the right shoulder.  She denies any prior surgeries, nerve blocks, physical therapy, but again she thinks that she might have had some x-rays done at [facility].  The patient's seventh area of pain is that of the upper extremities (Bilateral) (R>L).  She describes the pain to radiate from the shoulder down to the mid upper arm on the lateral aspect of the arm.  She describes having pain and numbness in both hands which she attributes to the peripheral neuropathy.  She refers having pain primarily in the area of the right shoulder.  The patient's eighth area of pain is that of her ribs (Bilateral) (R=L).  She describes this pain to be worse with walking.  The pain is intermittent and it seems to start in the posterolateral aspect of her ribs and travel to the anterior portion of the body ending over the costochondral joint.  The pain seems to be  in the rib cage at the level of T6.  In addition to the above, the patient has a history of having had a stroke around 2019 that left her with left body weakness requiring that she move around using a walker.  Other medical problems include insulin dependent diabetes mellitus with a resting blood sugar this morning of 340.  Today I took the time to provide the patient with information regarding my pain practice. The patient was informed that my practice is  divided into two sections: an interventional pain management section, as well as a completely separate and distinct medication management section. I explained that I have procedure days for my interventional therapies, and evaluation days for follow-ups and medication management. Because of the amount of documentation required during both, they are kept separated. This means that there is the possibility that she may be scheduled for a procedure on one day, and medication management the next. I have also informed her that because of staffing and facility limitations, I no longer take patients for medication management only. To illustrate the reasons for this, I gave the patient the example of surgeons, and how inappropriate it would be to refer a patient to his/her care, just to write for the post-surgical antibiotics on a surgery done by a different surgeon.   Because interventional pain management is my board-certified specialty, the patient was informed that joining my practice means that they are open to any and all interventional therapies. I made it clear that this does not mean that they will be forced to have any procedures done. What this means is that I believe interventional therapies to be essential part of the diagnosis and proper management of chronic pain conditions. Therefore, patients not interested in these interventional alternatives will be better served under the care of a different practitioner.  The patient was also made aware of my Comprehensive Pain Management Safety Guidelines where by joining my practice, they limit all of their nerve blocks and joint injections to those done by our practice, for as long as we are retained to manage their care.   Historic Controlled Substance Pharmacotherapy Review  PMP and historical list of controlled substances: Hydrocodone/chloramphenicol ER suspension last filled on 01/14/2021 Current opioid analgesics: None MME/day: 0 mg/day  Historical  Monitoring: The patient  reports no history of drug use. List of all UDS Test(s): No results found for: MDMA, COCAINSCRNUR, [place], [place], CANNABQUANT, [place], [place] List of other Serum/Urine Drug Screening Test(s):  No results found for: AMPHSCRSER, BARBSCRSER, BENZOSCRSER, COCAINSCRSER, COCAINSCRNUR, PCPSCRSER, PCPQUANT, THCSCRSER, THCU, CANNABQUANT, OPIATESCRSER, OXYSCRSER, PROPOXSCRSER, ETH Historical Background Evaluation: [place] PMP: PDMP not reviewed this encounter. Online review of the past 8-monthperiod conducted.             PMP NARX Score Report:  Narcotic: 100 Sedative: 080 Stimulant: 000 [place] Department of public safety, offender search: (Editor, commissioningInformation) Non-contributory Risk Assessment Profile: Aberrant behavior: None observed or detected today Risk factors for fatal opioid overdose: None identified today PMP NARX Overdose Risk Score: 210 Fatal overdose hazard ratio (HR): Calculation deferred Non-fatal overdose hazard ratio (HR): Calculation deferred Risk of opioid abuse or dependence: 0.7-3.0% with doses ? 36 MME/day and 6.1-26% with doses ? 120 MME/day. Substance use disorder (SUD) risk level: See below Personal History of Substance Abuse (SUD-Substance use disorder):  Alcohol: Negative  Illegal Drugs: Negative  Rx Drugs: Negative  ORT Risk Level calculation: Low Risk  Opioid Risk Tool - 06/10/21 1125       Family  History of Substance Abuse   Alcohol Negative    Illegal Drugs Negative    Rx Drugs Negative      Personal History of Substance Abuse   Alcohol Negative    Illegal Drugs Negative    Rx Drugs Negative      Age   Age between 48-45 years  No      History of Preadolescent Sexual Abuse   History of Preadolescent Sexual Abuse Negative or Female      Psychological Disease   Psychological Disease Positive    Depression Positive      Total Score   Opioid Risk Tool Scoring 3    Opioid Risk Interpretation Low Risk            ORT Scoring  interpretation table:  Score <3 = Low Risk for SUD  Score between 4-7 = Moderate Risk for SUD  Score >8 = High Risk for Opioid Abuse   PHQ-2 Depression Scale:  Total score:    PHQ-2 Scoring interpretation table: (Score and probability of major depressive disorder)  Score 0 = No depression  Score 1 = 15.4% Probability  Score 2 = 21.1% Probability  Score 3 = 38.4% Probability  Score 4 = 45.5% Probability  Score 5 = 56.4% Probability  Score 6 = 78.6% Probability   PHQ-9 Depression Scale:  Total score:    PHQ-9 Scoring interpretation table:  Score 0-4 = No depression  Score 5-9 = Mild depression  Score 10-14 = Moderate depression  Score 15-19 = Moderately severe depression  Score 20-27 = Severe depression (2.4 times higher risk of SUD and 2.89 times higher risk of overuse)   Pharmacologic Plan: As per protocol, I have not taken over any controlled substance management, pending the results of ordered tests and/or consults.            Initial impression: Pending review of available data and ordered tests.  Meds   Current Outpatient Medications:    amLODipine (NORVASC) 10 MG tablet, Take one tab daily for blood pressure., Disp: 90 tablet, Rfl: 1   Blood Glucose Monitoring Suppl (ONETOUCH VERIO FLEX SYSTEM) w/Device KIT, Use as instructed to test blood sugar 3 times daily, Disp: 1 kit, Rfl: 0   buPROPion (WELLBUTRIN XL) 150 MG 24 hr tablet, TAKE 1 TABLET BY MOUTH ONCE DAILY FOR ANXIETY AND DEPRESSION., Disp: 90 tablet, Rfl: 1   clopidogrel (PLAVIX) 75 MG tablet, TAKE 1 TABLET BY MOUTH EVERY DAY, Disp: 90 tablet, Rfl: 3   Continuous Blood Gluc Receiver (FREESTYLE LIBRE 14 DAY READER) DEVI, 1 Device by Does not apply route in the morning, at noon, and at bedtime., Disp: 1 each, Rfl: 0   Continuous Blood Gluc Sensor (FREESTYLE LIBRE 14 DAY SENSOR) MISC, USE AS DIRECTED CHANGE SENSOR EVERY 14 DAYS *NOT COVERRED BY INS*, Disp: 6 each, Rfl: 3   cyclobenzaprine (FLEXERIL) 10 MG tablet, TAKE 1  TABLET BY MOUTH AT BEDTIME AS NEEDED FOR MUSCLE SPASMS, Disp: 90 tablet, Rfl: 0   folic acid (FOLVITE) 1 MG tablet, Take 2 tablets by mouth daily., Disp: 180 tablet, Rfl: 1   glucose blood test strip, Use as instructed to blood sugar 3 times daily, Disp: 300 each, Rfl: 2   hydroxychloroquine (PLAQUENIL) 200 MG tablet, Take 1 tablet (200 mg total) by mouth 2 (two) times daily., Disp: 180 tablet, Rfl: 0   insulin glargine (LANTUS) 100 UNIT/ML Solostar Pen, Inject 45-50 Units into the skin at bedtime., Disp: 15 mL, Rfl: 3  Insulin Pen Needle (PEN NEEDLES) 31G X 6 MM MISC, Use with insulin as directed., Disp: 100 each, Rfl: 0   methotrexate (RHEUMATREX) 2.5 MG tablet, Take 8 tablets (20 mg total) by mouth once a week. Caution:Chemotherapy. Protect from light., Disp: 32 tablet, Rfl: 2   rosuvastatin (CRESTOR) 20 MG tablet, Take 1 tablet (20 mg total) by mouth daily. For cholesterol, Disp: 90 tablet, Rfl: 3   Semaglutide,0.25 or 0.5MG/DOS, (OZEMPIC, 0.25 OR 0.5 MG/DOSE,) 2 MG/1.5ML SOPN, Inject 0.5 mg into the skin once a week. For diabetes., Disp: 4.5 mL, Rfl: 0   spironolactone (ALDACTONE) 25 MG tablet, Take 25 mg by mouth daily., Disp: , Rfl:    venlafaxine XR (EFFEXOR XR) 37.5 MG 24 hr capsule, Take 1 capsule (37.5 mg total) by mouth daily with breakfast. For anxiety and depression., Disp: 30 capsule, Rfl: 0   metoprolol succinate (TOPROL-XL) 50 MG 24 hr tablet, Take 1 tablet (50 mg total) by mouth daily. For blood pressure, Disp: 90 tablet, Rfl: 3   telmisartan-hydrochlorothiazide (MICARDIS HCT) 80-25 MG tablet, Take by mouth., Disp: , Rfl:   Imaging Review  Cervical Imaging: Cervical MR w/wo contrast: Results for orders placed during the hospital encounter of 03/02/19 MR CERVICAL SPINE W WO CONTRAST  Narrative CLINICAL DATA:  Persistent left-sided weakness and sensory loss. History of lupus. Evaluate for cord demyelinating lesions.  EXAM: MRI CERVICAL AND THORACIC SPINE WITH AND WITHOUT  CONTRAST  TECHNIQUE: Multiplanar and multiecho pulse sequences of the cervical spine, to include the craniocervical junction and cervicothoracic junction, and thoracic spine, were obtained with and without intravenous contrast.  CONTRAST:  9 mL Gadavist intravenous contrast.  COMPARISON:  MRI thoracic spine dated October 26, 2017. MRI cervical spine dated March 15, 2017.  FINDINGS: MRI CERVICAL SPINE FINDINGS  Alignment: Straightening of the normal cervical lordosis.  Vertebrae: No fracture, evidence of discitis, or bone lesion.  Cord: Normal signal and morphology.  No intradural enhancement.  Posterior Fossa, vertebral arteries, paraspinal tissues: Negative.  Disc levels:  C2-C3:  Negative.  C3-C4: Negative disc. Mild bilateral uncovertebral hypertrophy. No stenosis.  C4-C5:  Minimal disc bulging.  No stenosis.  C5-C6: Unchanged diffuse disc bulging and mild bilateral uncovertebral hypertrophy resulting in mild-to-moderate spinal canal stenosis and mild to moderate bilateral neuroforaminal stenosis.  C6-C7: Unchanged shallow left paracentral disc protrusion. No stenosis.  C7-T1:  Negative.  MRI THORACIC SPINE FINDINGS  Alignment:  Physiologic.  Vertebrae: No fracture, evidence of discitis, or bone lesion.  Cord:  Normal signal and morphology.  No intradural enhancement.  Paraspinal and other soft tissues: Negative.  Disc levels:  No significant disc bulge or herniation. No spinal canal or neuroforaminal stenosis.  IMPRESSION: 1. Normal cervicothoracic spinal cord. No evidence of demyelinating disease. 2. Unchanged cervical spondylosis with mild-to-moderate stenosis at C5-C6.   Electronically Signed By: Titus Dubin M.D. On: 03/04/2019 17:26  Cervical DG complete: Results for orders placed in visit on 02/18/17 DG Cervical Spine Complete  Narrative CLINICAL DATA:  Neck pain.  EXAM: CERVICAL SPINE - COMPLETE 4+ VIEW  COMPARISON:  No recent  prior .  FINDINGS: Diffuse multilevel degenerative change. Degenerative changes particularly prominent at C5-C6. No acute bony abnormality identified. Neuroforamen are patent. Pulmonary apices are clear.  IMPRESSION: No acute or focal bony abnormality.   Electronically Signed By: Marcello Moores  Register On: 02/18/2017 15:06  Shoulder Imaging: Gaston Islam DG: Results for orders placed in visit on 02/18/17 DG Shoulder Right  Narrative CLINICAL DATA:  Neck pain.  Right shoulder  pain .  EXAM: RIGHT SHOULDER - 2+ VIEW  COMPARISON:  No recent .  FINDINGS: No acute bony or joint abnormality identified. No evidence of fracture or dislocation.  IMPRESSION: No acute abnormality.   Electronically Signed By: Marcello Moores  Register On: 02/18/2017 15:07  Thoracic Imaging: Thoracic MR w/wo contrast: Results for orders placed during the hospital encounter of 03/02/19 MR THORACIC SPINE W WO CONTRAST  Narrative CLINICAL DATA:  Persistent left-sided weakness and sensory loss. History of lupus. Evaluate for cord demyelinating lesions.  EXAM: MRI CERVICAL AND THORACIC SPINE WITH AND WITHOUT CONTRAST  TECHNIQUE: Multiplanar and multiecho pulse sequences of the cervical spine, to include the craniocervical junction and cervicothoracic junction, and thoracic spine, were obtained with and without intravenous contrast.  CONTRAST:  9 mL Gadavist intravenous contrast.  COMPARISON:  MRI thoracic spine dated October 26, 2017. MRI cervical spine dated March 15, 2017.  FINDINGS: MRI CERVICAL SPINE FINDINGS  Alignment: Straightening of the normal cervical lordosis.  Vertebrae: No fracture, evidence of discitis, or bone lesion.  Cord: Normal signal and morphology.  No intradural enhancement.  Posterior Fossa, vertebral arteries, paraspinal tissues: Negative.  Disc levels:  C2-C3:  Negative.  C3-C4: Negative disc. Mild bilateral uncovertebral hypertrophy. No stenosis.  C4-C5:  Minimal  disc bulging.  No stenosis.  C5-C6: Unchanged diffuse disc bulging and mild bilateral uncovertebral hypertrophy resulting in mild-to-moderate spinal canal stenosis and mild to moderate bilateral neuroforaminal stenosis.  C6-C7: Unchanged shallow left paracentral disc protrusion. No stenosis.  C7-T1:  Negative.  MRI THORACIC SPINE FINDINGS  Alignment:  Physiologic.  Vertebrae: No fracture, evidence of discitis, or bone lesion.  Cord:  Normal signal and morphology.  No intradural enhancement.  Paraspinal and other soft tissues: Negative.  Disc levels:  No significant disc bulge or herniation. No spinal canal or neuroforaminal stenosis.  IMPRESSION: 1. Normal cervicothoracic spinal cord. No evidence of demyelinating disease. 2. Unchanged cervical spondylosis with mild-to-moderate stenosis at C5-C6.   Electronically Signed By: Titus Dubin M.D. On: 03/04/2019 17:26  Lumbosacral Imaging: Lumbar MR wo contrast: Results for orders placed during the hospital encounter of 09/29/15 MR Lumbar Spine Wo Contrast  Narrative CLINICAL DATA:  Recent exacerbation of chronic pain. Bilateral low back and buttock pain. Right groin and anterior thigh pain. Right leg weakness. Numbness in the right toes.  EXAM: MRI LUMBAR SPINE WITHOUT CONTRAST  TECHNIQUE: Multiplanar, multisequence MR imaging of the lumbar spine was performed. No intravenous contrast was administered.  COMPARISON:  Lumbar spine radiographs 09/17/2015.  Report of MRI lumbar spine 01/10/2007  FINDINGS: Normal signal is present in the conus medullaris which terminates at L1, within normal limits. Marrow signal, vertebral body heights, alignment are normal. Mild leftward curvature of the lumbar spine is centered at L2-3.  Limited imaging of the abdomen is unremarkable.  L1-2: Mild disc bulging is present without significant stenosis.  L2-3: A mild broad-based disc protrusion is present. There is  some distortion of the central canal without focal stenosis. Asymmetric right-sided facet hypertrophy is noted.  L3-4: An asymmetric right-sided disc protrusion is present. Moderate facet hypertrophy is noted bilaterally. This results in moderate right mild left subarticular stenosis. A superior disc extrusion or more likely free fragment is present in the right lateral recess posterior to the L3 vertebral body, likely impacting the traversing right L3 nerve root. Mild right foraminal stenosis is present as well. The left foramen is patent.  L4-5: A broad-based disc protrusion is asymmetric to the left. Mild subarticular narrowing is worse  on the left. Mild foraminal narrowing is also worse on the left.  L5-S1: A shallow central disc protrusion is present. Mild facet hypertrophy is noted bilaterally. The foramina are patent.  IMPRESSION: 1. Prominent rightward disc protrusion at L3-4 with moderate right subarticular narrowing potentially affecting the right L4 nerve root. 2. Superior disc extrusion or more likely free fragment resulting and moderate right lateral recess stenosis posterior to the right L3 vertebral body, likely impacting the right L3 nerve root. 3. Mild subarticular and foraminal narrowing at L4-5, worse on the left. 4. Broad-based disc protrusion and facet hypertrophy at L2-3 without significant stenosis. 5. Shallow central disc protrusion and mild facet hypertrophy at L5-S1 without significant stenosis.   Electronically Signed By: San Morelle M.D. On: 09/29/2015 12:03  Lumbar MR w/wo contrast: Results for orders placed during the hospital encounter of 04/07/18 MR Lumbar Spine W Wo Contrast  Narrative CLINICAL DATA:  Spondylolisthesis of lumbar region. Low back pain radiating to the bilateral hips.  EXAM: MRI LUMBAR SPINE WITHOUT AND WITH CONTRAST  TECHNIQUE: Multiplanar and multiecho pulse sequences of the lumbar spine were obtained without  and with intravenous contrast.  CONTRAST:  10m MULTIHANCE GADOBENATE DIMEGLUMINE 529 MG/ML IV SOLN  COMPARISON:  10/26/2017  FINDINGS: Segmentation:  5 lumbar type vertebral bodies.  Alignment:  Mild levoscoliosis.  Vertebrae: No evidence of fracture, discitis, or aggressive bone lesion.  Conus medullaris and cauda equina: Conus extends to the L1 level. Conus and cauda equina appear normal.  Paraspinal and other soft tissues: Asymmetric strandy T2 hyperintensity ventral is chronic and stable.  Disc levels:  T12- L1: Unremarkable.  L1-L2: Disc narrowing and desiccation with a right paracentral upward migrating disc protrusion that is new. Increased right subarticular recess stenosis but no static L2 compression.  L2-L3: Chronic disc narrowing and asymmetric rightward bulging. Degenerative facet spurring greater on the right. Right subarticular recess narrowing without definite compression. Right foraminal narrowing is mild.  L3-L4: Advanced degenerative disc collapse. Chronic right paracentral disc protrusion that is regressed. The spinal canal is patent after decompressive laminectomy. There is moderate right foraminal narrowing with deflection posteriorly of the right L3 nerve root, stable. No right L3 compression-status post partial right facetectomy.  L4-L5: Disc narrowing and bulging. Degenerative facet hypertrophy. Patent spinal canal after laminectomy. Moderate left foraminal narrowing due to disc height loss and bulge primarily, borderline progressed.  L5-S1:Mild disc narrowing and bulging. Mild facet spurring. No impingement.  IMPRESSION: 1. Diffuse disc and facet degeneration with mild levoscoliosis. 2. L1-2 right paracentral superiorly migrating disc extrusion that is new/progressed from 10/26/2017. There is right subarticular recess narrowing without static L2 compression. 3. L2-3 stable right subarticular recess narrowing from asymmetric rightward  disc bulging. 4. L3-4 decompressive laminectomy with patent spinal canal; a right paracentral disc protrusion at this level has regressed since 10/26/2017. Patent right foramen after right facetectomy; the right L3 nerve root is chronically deflected by degenerative endplate spurring. 5. L4-5 patent spinal canal after laminectomy. Moderate left foraminal narrowing that is borderline progressed.   Electronically Signed By: JMonte FantasiaM.D. On: 04/07/2018 13:03  Lumbar CT wo contrast: Results for orders placed during the hospital encounter of 05/26/18 CT LUMBAR SPINE WO CONTRAST  Narrative CLINICAL DATA:  Unable to feel right leg following surgery. Lumbar fusion today.  EXAM: CT LUMBAR SPINE WITHOUT CONTRAST  TECHNIQUE: Multidetector CT imaging of the lumbar spine was performed without intravenous contrast administration. Multiplanar CT image reconstructions were also generated.  COMPARISON:  Intraoperative radiographs. MRI  of the lumbar spine 04/07/2018.  FINDINGS: Segmentation: 5 non rib-bearing lumbar type vertebral bodies are present. The lowest fully formed vertebral body is L5.  Alignment: Slight anterolisthesis at L4-5 is stable. AP alignment is otherwise anatomic. Mild leftward curvature is present at L2. Mild rightward curvature is present at L4-5.  Vertebrae: Vertebral body heights are normal. Bilateral pedicle screws are in place at L3, L4, and L5. Pedicle screws are Holy contained within the bone.  Paraspinal and other soft tissues: Subcentimeter para-aortic lymph nodes are again noted. No acute abnormalities are present. There is no significant adenopathy. Postsurgical changes are noted within the paraspinous musculature.  Disc levels: L1-2: A mild broad-based disc protrusion is present. There is no significant stenosis or change.  L2-3: A broad-based disc protrusion is present. Mild right subarticular narrowing is present. Mild right foraminal  narrowing is stable.  L3-4: Posterior canal is decompressed. Previous laminectomy and right foraminotomy are noted. Minimal right foraminal narrowing is stable.  L4-5: A broad-based disc protrusion is present. Laminectomy is noted. Mild foraminal narrowing remains bilaterally. Lateral disc protrusions remain.  L5-S1: Mild facet hypertrophy is present bilaterally. No focal protrusion or stenosis is present.  IMPRESSION: 1. Interval posterior fusion hardware at L3-4 and L4-5. Pedicle screws are fully contained within the bone. No complicating features present. 2. Previous laminectomies at L3 and L4. There may still be some disc material in the foramen. 3. Posterior canal and medial foramen are decompressed. 4. Mild left foraminal narrowing at L3-4 is stable. 5. Mild residual foraminal narrowing bilaterally at L4-5. 6. These results were called by telephone at the time of interpretation on 05/26/2018 at 2:01 pm to Dr. Sherley Bounds , who verbally acknowledged these results.   Electronically Signed By: San Morelle M.D. On: 05/26/2018 14:03  Lumbar CT w contrast: Results for orders placed during the hospital encounter of 04/02/16 CT Lumbar Spine W Contrast  Narrative CLINICAL DATA:  Low back pain.  Previous lumbar decompression.  EXAM: LUMBAR MYELOGRAM  FLUOROSCOPY TIME:  32 seconds corresponding to a Dose Area Product of 269.61 low are Gy*m2  PROCEDURE: After thorough discussion of risks and benefits of the procedure including bleeding, infection, injury to nerves, blood vessels, adjacent structures as well as headache and CSF leak, written and oral informed consent was obtained. Consent was obtained by Dr. Rolla Flatten. Time out form was completed.  Patient was positioned prone on the fluoroscopy table. Local anesthesia was provided with 1% lidocaine without epinephrine after prepped and draped in the usual sterile fashion. Puncture was performed at L3-L4  using a 3 1/2 inch 22-gauge spinal needle via midline approach. Using a single pass through the dura, the needle was placed within the thecal sac, with return of clear CSF. 15 mL of Isovue-M 200 was injected into the thecal sac, with normal opacification of the nerve roots and cauda equina consistent with free flow within the subarachnoid space.  I personally performed the lumbar puncture and administered the intrathecal contrast. I also personally supervised acquisition of the myelogram images.  TECHNIQUE: Contiguous axial images were obtained through the Lumbar spine after the intrathecal infusion of infusion. Coronal and sagittal reconstructions were obtained of the axial image sets.  COMPARISON:  Preoperative MR 09/29/2015. Postoperative MR 01/07/2016.  FINDINGS: LUMBAR MYELOGRAM FINDINGS:  Good opacification lumbar subarachnoid space. No disc protrusion or spinal stenosis. Disc space narrowing at L3-4 is accompanied by shallow ventral defects at that level as well as sella defects at L2-3 and L4-5. Anatomic alignment  is present with patient prone for myelography.  When the patient stands, stenosis develops at L2-3, due to a combination of increasingly prominent ventral disc protrusion and slight degenerative scoliosis convex LEFT. Based on the AP standing view, BILATERAL L3 nerve root impingement is observed at the L2-3 level. There is no anterolisthesis or retrolisthesis on standing flexion extension views, but the stenosis does become worse at L2-3 with patient standing in extension. In addition, with patient standing on the AP view, LEFT subarticular zone narrowing at L4-5 effaces the L5 nerve root.  CT LUMBAR MYELOGRAM FINDINGS:  Segmentation: Normal.  Alignment: Normal. 9 degree scoliosis convex LEFT centered at L3-4 due to asymmetric loss of interspace height at that level.  Vertebrae: No worrisome osseous lesion.  Conus medullaris: Normal in size  location.  Paraspinal tissues: No evidence for hydronephrosis or paravertebral mass.  Disc levels:  L1-L2:  Shallow protrusion.  No impingement.  L2-L3: Central protrusion. Mild facet arthropathy. Mild stenosis. BILATERAL L3 nerve root impingement is possible.  L3-L4: Postsurgical change on the RIGHT. Shallow protrusion. No impingement.  L4-L5: Central and leftward protrusion. Mild facet arthropathy. LEFT L5 nerve root impingement is likely.  L5-S1:  Mild bulge.  Mild facet arthropathy.  No impingement.  IMPRESSION: LUMBAR MYELOGRAM IMPRESSION:  Status post L3-4 discectomy. No dynamic instability, but stenosis develops at L2-3 with patient standing, related to increasingly prominent central protrusion.  CT LUMBAR MYELOGRAM IMPRESSION:  No postsurgical complications. Slight asymmetric loss of interspace height at L3-4 on the RIGHT contributes to mild scoliosis convex LEFT.  Central protrusion at L2-3 resultant only mild stenosis with patient recumbent for CT. Correlate clinically for symptomatic BILATERAL L3 nerve root impingement.  Shallow central and leftward protrusion at L4-5 is noted on CT. LEFT L5 nerve root impingement is only noted myelographically with patient standing.   Electronically Signed By: Staci Righter M.D. On: 04/02/2016 11:15  Lumbar DG 1V: Results for orders placed during the hospital encounter of 03/29/17 DG Lumbar Spine 1 View  Narrative CLINICAL DATA:  Intraoperative localization for spine surgery.  EXAM: LUMBAR SPINE - 1 VIEW  COMPARISON:  Lumbar spine MRI 02/01/2017  FINDINGS: There are retractors noted posteriorly at the L3-4 and L4-5 levelsa the MTP surgical instrument marking the L4-5 level.  IMPRESSION: L4-5 marked intraoperatively.   Electronically Signed By: Marijo Sanes M.D. On: 03/29/2017 08:36  Lumbar DG 2-3 views: Results for orders placed during the hospital encounter of 05/26/18 DG Lumbar Spine 2-3  Views  Narrative CLINICAL DATA:  Posterior fusion  EXAM: LUMBAR SPINE - 2-3 VIEW; DG C-ARM 61-120 MIN  COMPARISON:  Lumbar radiographs February 28, 2018  FLUOROSCOPY TIME:  1 minutes 16 seconds; 2 acquired images  FINDINGS: Frontal and lateral views were obtained. There is posterior screw and plate fixation at L3, L4, and L5 bilaterally with pedicle screw tips in the respective vertebral bodies. No fracture or spondylolisthesis. There is moderate disc space narrowing at L3-4. Other disc spaces appear unremarkable.  IMPRESSION: Bilateral posterior screw and plate fixation at L3, L4, and L5 with support hardware intact. Moderate disc space narrowing at L3-4 noted. No fracture or spondylolisthesis.   Electronically Signed By: Lowella Grip III M.D. On: 05/26/2018 13:04  Lumbar DG (Complete) 4+V: Results for orders placed in visit on 09/17/15 DG Lumbar Spine Complete  Narrative CLINICAL DATA:  Right hip and low back pain, history of rheumatoid arthritis  EXAM: LUMBAR SPINE - COMPLETE 4+ VIEW  COMPARISON:  None.  FINDINGS: The lumbar vertebrae  are normal alignment. Mild degenerative disc disease is noted at L2-3 with there is some loss of disc space and mild spurring. No compression deformity is seen. Mild degenerative change of the facet joints of L5-S1 is noted. The SI joints are corticated. The bowel gas pattern is nonspecific.  IMPRESSION: 1. Mild degenerative disc disease at L2-3. 2. Mild degenerative change of the facet joints of L5-S1.   Electronically Signed By: Ivar Drape M.D. On: 09/17/2015 17:05   Lumbar DG Myelogram Lumbosacral: Results for orders placed during the hospital encounter of 04/02/16 DG MYELOGRAPHY LUMBAR INJ LUMBOSACRAL  Narrative CLINICAL DATA:  Low back pain.  Previous lumbar decompression.  EXAM: LUMBAR MYELOGRAM  FLUOROSCOPY TIME:  32 seconds corresponding to a Dose Area Product of 269.61 low are Gy*m2  PROCEDURE: After  thorough discussion of risks and benefits of the procedure including bleeding, infection, injury to nerves, blood vessels, adjacent structures as well as headache and CSF leak, written and oral informed consent was obtained. Consent was obtained by Dr. Rolla Flatten. Time out form was completed.  Patient was positioned prone on the fluoroscopy table. Local anesthesia was provided with 1% lidocaine without epinephrine after prepped and draped in the usual sterile fashion. Puncture was performed at L3-L4 using a 3 1/2 inch 22-gauge spinal needle via midline approach. Using a single pass through the dura, the needle was placed within the thecal sac, with return of clear CSF. 15 mL of Isovue-M 200 was injected into the thecal sac, with normal opacification of the nerve roots and cauda equina consistent with free flow within the subarachnoid space.  I personally performed the lumbar puncture and administered the intrathecal contrast. I also personally supervised acquisition of the myelogram images.  TECHNIQUE: Contiguous axial images were obtained through the Lumbar spine after the intrathecal infusion of infusion. Coronal and sagittal reconstructions were obtained of the axial image sets.  COMPARISON:  Preoperative MR 09/29/2015. Postoperative MR 01/07/2016.  FINDINGS: LUMBAR MYELOGRAM FINDINGS:  Good opacification lumbar subarachnoid space. No disc protrusion or spinal stenosis. Disc space narrowing at L3-4 is accompanied by shallow ventral defects at that level as well as sella defects at L2-3 and L4-5. Anatomic alignment is present with patient prone for myelography.  When the patient stands, stenosis develops at L2-3, due to a combination of increasingly prominent ventral disc protrusion and slight degenerative scoliosis convex LEFT. Based on the AP standing view, BILATERAL L3 nerve root impingement is observed at the L2-3 level. There is no anterolisthesis or retrolisthesis on  standing flexion extension views, but the stenosis does become worse at L2-3 with patient standing in extension. In addition, with patient standing on the AP view, LEFT subarticular zone narrowing at L4-5 effaces the L5 nerve root.  CT LUMBAR MYELOGRAM FINDINGS:  Segmentation: Normal.  Alignment: Normal. 9 degree scoliosis convex LEFT centered at L3-4 due to asymmetric loss of interspace height at that level.  Vertebrae: No worrisome osseous lesion.  Conus medullaris: Normal in size location.  Paraspinal tissues: No evidence for hydronephrosis or paravertebral mass.  Disc levels:  L1-L2:  Shallow protrusion.  No impingement.  L2-L3: Central protrusion. Mild facet arthropathy. Mild stenosis. BILATERAL L3 nerve root impingement is possible.  L3-L4: Postsurgical change on the RIGHT. Shallow protrusion. No impingement.  L4-L5: Central and leftward protrusion. Mild facet arthropathy. LEFT L5 nerve root impingement is likely.  L5-S1:  Mild bulge.  Mild facet arthropathy.  No impingement.  IMPRESSION: LUMBAR MYELOGRAM IMPRESSION:  Status post L3-4 discectomy. No dynamic instability,  but stenosis develops at L2-3 with patient standing, related to increasingly prominent central protrusion.  CT LUMBAR MYELOGRAM IMPRESSION:  No postsurgical complications. Slight asymmetric loss of interspace height at L3-4 on the RIGHT contributes to mild scoliosis convex LEFT.  Central protrusion at L2-3 resultant only mild stenosis with patient recumbent for CT. Correlate clinically for symptomatic BILATERAL L3 nerve root impingement.  Shallow central and leftward protrusion at L4-5 is noted on CT. LEFT L5 nerve root impingement is only noted myelographically with patient standing.   Electronically Signed By: Staci Righter M.D. On: 04/02/2016 11:15  Spine Imaging: Spine Outside MR Films: Results for orders placed in visit on 02/19/16  Hip Imaging: Hip-R DG 2-3 views: Results  for orders placed in visit on 09/17/15 DG HIP UNILAT WITH PELVIS 2-3 VIEWS RIGHT  Narrative CLINICAL DATA:  Rheumatoid arthritis.  Pain.  EXAM: DG HIP (WITH OR WITHOUT PELVIS) 2-3V RIGHT  COMPARISON:  None.  FINDINGS: No acute bony or joint abnormality identified. No evidence of fracture or dislocation. Mild degenerative changes both hips. Pelvic calcifications consistent phleboliths.  IMPRESSION: Mild degenerative changes both hips.  No acute abnormality.   Electronically Signed By: Marcello Moores  Register On: 09/17/2015 17:05  Complexity Note: Imaging results reviewed. Results shared with Lauren Winters, using Layman's terms.                        ROS  Cardiovascular: High blood pressure, Chest pain, Heart murmur, Heart valve problems, and Blood thinners:  Anticoagulant Pulmonary or Respiratory: Shortness of breath and Coughing up mucus (Bronchitis) Neurological:  Psychological-Psychiatric: Anxiousness, Depressed, and Difficulty sleeping and or falling asleep Gastrointestinal: Reflux or heatburn Genitourinary: Kidney disease Hematological: Brusing easily Endocrine: High blood sugar requiring insulin (IDDM) Rheumatologic: Butterfly-like facial rash (Lupus), Joint aches and or swelling due to excess weight (Osteoarthritis), Rheumatoid arthritis, and Generalized muscle aches (Fibromyalgia) Musculoskeletal: Negative for myasthenia gravis, muscular dystrophy, multiple sclerosis or malignant hyperthermia Work History: Disabled  Allergies  Lauren Winters is allergic to pseudoephedrine hcl, ancef [cefazolin], clindamycin/lincomycin, prednisone, triprolidine hcl, adhesive [tape], other, and sudafed [pseudoephedrine hcl].  Laboratory Chemistry Profile   Renal Lab Results  Component Value Date   BUN 18 06/10/2021   CREATININE 0.82 06/10/2021   LABCREA 44 30/94/0768   BCR NOT APPLICABLE 08/81/1031   GFR 90.11 02/17/2021   GFRAA 103 10/20/2020   GFRNONAA >60 06/10/2021   SPECGRAV 1.020  09/19/2017   PHUR 6.5 09/19/2017   PROTEINUR NEGATIVE 10/20/2020     Electrolytes Lab Results  Component Value Date   NA 135 06/10/2021   K 4.0 06/10/2021   CL 98 06/10/2021   CALCIUM 9.7 06/10/2021   MG 1.9 06/10/2021     Hepatic Lab Results  Component Value Date   AST 19 06/10/2021   ALT 13 06/10/2021   ALBUMIN 4.4 06/10/2021   ALKPHOS 102 06/10/2021     ID Lab Results  Component Value Date   LYMEIGGIGMAB <0.91 06/01/2017   HIV Non Reactive 03/03/2019   SARSCOV2NAA Not Detected 01/14/2021   STAPHAUREUS NEGATIVE 05/17/2018   MRSAPCR NEGATIVE 05/17/2018     Bone Lab Results  Component Value Date   VD25OH 14.79 (L) 06/10/2021   VD125OH2TOT 44 02/01/2020   [id number] 44 02/01/2020   VD2125OH2 <8 02/01/2020     Endocrine Lab Results  Component Value Date   GLUCOSE 416 (H) 06/10/2021   GLUCOSEU 3+ (A) 10/20/2020   HGBA1C 10.4 (A) 06/03/2021   TSH 0.99 02/17/2021  Neuropathy Lab Results  Component Value Date   VITAMINB12 226 06/10/2021   HGBA1C 10.4 (A) 06/03/2021   HIV Non Reactive 03/03/2019     CNS No results found for: COLORCSF, APPEARCSF, RBCCOUNTCSF, WBCCSF, POLYSCSF, LYMPHSCSF, EOSCSF, PROTEINCSF, GLUCCSF, JCVIRUS, CSFOLI, IGGCSF, LABACHR, ACETBL, LABACHR, ACETBL   Inflammation (CRP: Acute  ESR: Chronic) Lab Results  Component Value Date   CRP 1.3 (H) 06/10/2021   ESRSEDRATE 36 (H) 06/10/2021   LATICACIDVEN 0.93 01/11/2018     Rheumatology Lab Results  Component Value Date   RF 14 (H) 01/31/2019   ANA POSITIVE (A) 02/01/2020   LYMEIGGIGMAB <0.91 06/01/2017     Coagulation Lab Results  Component Value Date   INR 1.0 06/10/2021   LABPROT 13.2 06/10/2021   APTT 30 06/10/2021   PLT 386 06/10/2021   DDIMER 0.51 (H) 01/14/2021     Cardiovascular Lab Results  Component Value Date   TROPONINI <0.03 08/19/2018   HGB 13.9 02/17/2021   HCT 39.9 02/17/2021     Screening Lab Results  Component Value Date   SARSCOV2NAA Not Detected  01/14/2021   STAPHAUREUS NEGATIVE 05/17/2018   MRSAPCR NEGATIVE 05/17/2018   HIV Non Reactive 03/03/2019     Cancer No results found for: CEA, CA125, LABCA2   Allergens No results found for: ALMOND, APPLE, ASPARAGUS, AVOCADO, BANANA, BARLEY, BASIL, BAYLEAF, GREENBEAN, LIMABEAN, WHITEBEAN, BEEFIGE, REDBEET, BLUEBERRY, BROCCOLI, CABBAGE, MELON, CARROT, CASEIN, CASHEWNUT, CAULIFLOWER, CELERY     Note: Lab results reviewed.  PFSH  Drug: Lauren Winters  reports no history of drug use. Alcohol:  reports no history of alcohol use. Tobacco:  reports that she quit smoking about 5 years ago. Her smoking use included cigarettes. She has a 5.00 pack-year smoking history. She has never used smokeless tobacco. Medical:  has a past medical history of Anxiety, Atypical chest pain, Chronic kidney disease, Complication of anesthesia, Depression, Diabetes mellitus without complication (HCC) (metformin), Dyspnea, Fibromyalgia, Headache, Heart murmur, Hyperlipidemia, Hypertension, Lupus (systemic lupus erythematosus) ([place]), Mitral regurgitation, Obesity (BMI 30-39.9), Raynaud's phenomenon, Rheumatoid arthritis ([place]), S/P lumbar laminectomy (12/09/2017), S/P lumbar spinal fusion (05/26/2018), Sjogren's disease ([place]), Spondylolisthesis of lumbar region, Stroke [facility]), Tobacco abuse, and Wound infection after surgery (01/11/2018). Family: family history includes Breast cancer in her paternal aunt, paternal aunt, paternal aunt, paternal aunt, and paternal aunt; CAD in her maternal grandfather, paternal aunt, paternal aunt, paternal aunt, paternal aunt, paternal aunt, and paternal uncle; CAD (age of onset: 54) in her father; Heart Problems in her brother; Hypertension in her father; Liver cancer (age of onset: 68) in her father; Stroke in her maternal grandfather and maternal grandmother; Valvular heart disease in her father.  Past Surgical History:  Procedure Laterality Date   ABDOMINAL HYSTERECTOMY     BACK SURGERY      BONE MARROW BIOPSY  10/2019   CESAREAN SECTION     x3   LUMBAR LAMINECTOMY     LUMBAR LAMINECTOMY/DECOMPRESSION MICRODISCECTOMY N/A 03/29/2017   Procedure: Re operative Right Lumbar Three-Four Laminectomy and discectomy with Left Lumbar Three-Four, Bilateral Lumbar Four-Five Laminectomy for decompression;  Surgeon: Kevan Ny Ditty, MD;  Location: [facility];  Service: Neurosurgery;  Laterality: N/A;   SPINAL CORD STIMULATOR INSERTION  12/09/2017   Procedure: LUMBAR SPINAL CORD STIMULATOR IMPLANT;  Surgeon: Eustace Moore, MD;  Location: [place];  Service: Neurosurgery;;   SPINAL CORD STIMULATOR REMOVAL N/A 01/11/2018   Procedure: LUMBAR SPINAL CORD STIMULATOR REMOVAL;  Surgeon: Eustace Moore, MD;  Location: [place];  Service: Neurosurgery;  Laterality: N/A;   TUBAL LIGATION     Active Ambulatory Problems    Diagnosis Date Noted   Mitral regurgitation    Atypical chest pain    Obesity (BMI 30-39.9)    Anxiety    Essential hypertension 10/06/2018   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[place]) 10/06/2018   Lupus ([place]) 08/04/2015   Rheumatoid factor positive 08/04/2015   Sjogren's syndrome ([place]) 08/04/2015   Hyperlipidemia 08/29/2015   Chronic mid back pain (5th area of Pain) (Bilateral) 12/17/2015   Depression 05/23/2014   Chest pain 05/23/2014   Preventative health care 04/19/2016   Primary osteoarthritis of both hands 12/22/2016   Primary osteoarthritis of both knees 12/22/2016   Spondylosis of lumbar region without myelopathy or radiculopathy 12/22/2016   Fibromyalgia 12/22/2016   Chronic shoulder pain (Right) 02/18/2017   HNP (herniated nucleus pulposus), lumbar 03/29/2017   High risk medication use 02/03/2018   History of lumbar spinal fusion (L3-4, L4-5) 05/26/2018   History of stroke 08/19/2018   Paresthesias 03/02/2019   Monoclonal gammopathy 05/14/2020   Adenopathy, cervical 08/18/2020   Cough 01/14/2021   Herniation of nucleus pulposus of cervical intervertebral disc  without myelopathy 02/09/2017   Disorder of sacroiliac joint 02/23/2016   Trochanteric bursitis 06/09/2017   Chronic hand pain (Bilateral) 12/06/2018   Bilateral primary osteoarthritis of hip 10/19/2019   Displacement of lumbar intervertebral disc without myelopathy 10/29/2015   Headache disorder 12/06/2018   Left arm weakness 10/11/2018   Local infection of wound 02/07/2018   Lumbar post-laminectomy syndrome (L3-4, L4-5) 06/09/2017   Lumbosacral spondylosis with radiculopathy 01/21/2017   Stage 3 chronic kidney disease ([place]) 10/06/2018   Chronic hip pain (2ry area of Pain) (Bilateral) 03/09/2016   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   Chronic low back pain (1ry area of Pain) (Bilateral) (R>L) w/o sciatica 02/04/2016   Rheumatoid arthritis involving multiple sites with positive rheumatoid factor ([place]) 06/09/2017   Numbness and tingling in left arm 10/11/2018   Chronic pain syndrome 06/10/2021   Pharmacologic therapy 06/10/2021   Disorder of skeletal system 06/10/2021   Problems influencing health status 06/10/2021   Elevated hemoglobin A1c 06/10/2021   Vitamin D deficiency 06/10/2021   Positive ANA (antinuclear antibody) 06/10/2021   Elevated rheumatoid factor 06/10/2021   Elevated d-dimer 06/10/2021   Chronic anticoagulation (Plavix) 06/10/2021   Chronic lower extremity pain (3ry area of Pain) (Right) 06/10/2021   Diabetic peripheral neuropathy (4th area of Pain) ([place]) 06/10/2021   Chronic, intermittent, rib pain (8th area of Pain) (Bilateral) 06/10/2021   Thoracic radiculitis (T6) (intermittent) (Bilateral) 06/10/2021   Chronic neck pain (6th area of Pain) (posterior) (Midline) (R>L) 06/10/2021   Cervicalgia 06/10/2021   Chronic upper extremity pain (7th area of Pain) (Right) 06/10/2021   History of infection of neurostimulator battery w/ removal of device 06/12/2021   Elevated C-reactive protein (CRP) 06/12/2021   Elevated sed rate 06/12/2021   DDD (degenerative disc  disease), thoracic 06/12/2021   DDD (degenerative disc disease), cervical 06/12/2021   Osteoarthritis of hip (Right) 06/12/2021   Other idiopathic scoliosis, thoracolumbar region 06/12/2021   Fixation hardware in spine (L3-4 and L4-5) 06/12/2021   Resolved Ambulatory Problems    Diagnosis Date Noted   Tobacco abuse    Breath shortness 05/23/2014   Diabetes mellitus arising in pregnancy 05/23/2014   History of hypertension 12/22/2016   Recurrent oral ulcers 12/22/2016   Hair loss 12/22/2016   S/P lumbar laminectomy 12/09/2017   Wound infection after surgery 01/11/2018  Paresthesias/numbness 03/04/2019   Past Medical History:  Diagnosis Date   Chronic kidney disease    Complication of anesthesia    Diabetes mellitus without complication (HCC) metformin   Dyspnea    Headache    Heart murmur    Hypertension    Lupus (systemic lupus erythematosus) ([place])    Raynaud's phenomenon    Rheumatoid arthritis ([place])    S/P lumbar spinal fusion 05/26/2018   Sjogren's disease ([place])    Spondylolisthesis of lumbar region    Stroke [facility])    Constitutional Exam  General appearance: Well nourished, well developed, and well hydrated. In no apparent acute distress Vitals:   06/10/21 1107  BP: (!) 185/83  Pulse: 92  Temp: (!) 96.8 F (36 C)  SpO2: 100%  Weight: 211 lb (95.7 kg)  Height: 5' 6"  (1.676 m)   BMI Assessment: Estimated body mass index is 34.06 kg/m as calculated from the following:   Height as of this encounter: 5' 6"  (1.676 m).   Weight as of this encounter: 211 lb (95.7 kg).  BMI interpretation table: BMI level Category Range association with higher incidence of chronic pain  <18 kg/m2 Underweight   18.5-24.9 kg/m2 Ideal body weight   25-29.9 kg/m2 Overweight Increased incidence by 20%  30-34.9 kg/m2 Obese (Class I) Increased incidence by 68%  35-39.9 kg/m2 Severe obesity (Class II) Increased incidence by 136%  >40 kg/m2 Extreme obesity (Class III) Increased incidence  by 254%   Patient's current BMI Ideal Body weight  Body mass index is 34.06 kg/m. Ideal body weight: 59.3 kg (130 lb 11.7 oz) Adjusted ideal body weight: 73.9 kg (162 lb 13.4 oz)   BMI Readings from Last 4 Encounters:  06/10/21 34.06 kg/m  06/03/21 34.06 kg/m  03/19/21 34.96 kg/m  02/20/21 34.26 kg/m   Wt Readings from Last 4 Encounters:  06/10/21 211 lb (95.7 kg)  06/03/21 211 lb (95.7 kg)  03/19/21 216 lb 9.6 oz (98.2 kg)  02/20/21 212 lb 4 oz (96.3 kg)    Psych/Mental status: Alert, oriented x 3 (person, place, & time)       Eyes: PERLA Respiratory: No evidence of acute respiratory distress  Assessment  Primary Diagnosis & Pertinent Problem List: The primary encounter diagnosis was Chronic low back pain (1ry area of Pain) (Bilateral) (R>L) w/o sciatica. Diagnoses of Failed back surgical syndrome, Chronic hip pain (2ry area of Pain) (Bilateral), Chronic lower extremity pain (3ry area of Pain) (Right), Diabetic peripheral neuropathy (4th area of Pain) (HCC), Chronic mid back pain (5th area of Pain) (Bilateral), Thoracic radiculitis (T6) (intermittent) (Bilateral), Chronic neck pain (6th area of Pain) (posterior) (Midline) (R>L), Cervicalgia, Chronic upper extremity pain (7th area of Pain) (Right), Chronic shoulder pain (Right), Chronic, intermittent, rib pain (8th area of Pain) (Bilateral), Fibromyalgia, Rheumatoid arthritis involving multiple sites with positive rheumatoid factor (HCC), Lupus ([place]), Sjogren's syndrome, with unspecified organ involvement (HCC), History of infection of neurostimulator battery w/ removal of device, Elevated C-reactive protein (CRP), Vitamin D deficiency, Elevated sed rate, DDD (degenerative disc disease), thoracic, DDD (degenerative disc disease), cervical, Osteoarthritis of hip (Right), Other idiopathic scoliosis, thoracolumbar region, Fixation hardware in spine (L3-4 and L4-5), Other intervertebral disc degeneration, lumbar region, Chronic pain  syndrome, Pharmacologic therapy, Chronic anticoagulation (Plavix), Disorder of skeletal system, and Problems influencing health status were also pertinent to this visit.  Visit Diagnosis (New problems to examiner): 1. Chronic low back pain (1ry area of Pain) (Bilateral) (R>L) w/o sciatica   2. Failed back surgical syndrome  3. Chronic hip pain (2ry area of Pain) (Bilateral)   4. Chronic lower extremity pain (3ry area of Pain) (Right)   5. Diabetic peripheral neuropathy (4th area of Pain) ([place])   6. Chronic mid back pain (5th area of Pain) (Bilateral)   7. Thoracic radiculitis (T6) (intermittent) (Bilateral)   8. Chronic neck pain (6th area of Pain) (posterior) (Midline) (R>L)   9. Cervicalgia   10. Chronic upper extremity pain (7th area of Pain) (Right)   11. Chronic shoulder pain (Right)   12. Chronic, intermittent, rib pain (8th area of Pain) (Bilateral)   13. Fibromyalgia   14. Rheumatoid arthritis involving multiple sites with positive rheumatoid factor ([place])   15. Lupus ([place])   16. Sjogren's syndrome, with unspecified organ involvement ([place])   17. History of infection of neurostimulator battery w/ removal of device   18. Elevated C-reactive protein (CRP)   19. Vitamin D deficiency   20. Elevated sed rate   21. DDD (degenerative disc disease), thoracic   22. DDD (degenerative disc disease), cervical   23. Osteoarthritis of hip (Right)   24. Other idiopathic scoliosis, thoracolumbar region   25. Fixation hardware in spine (L3-4 and L4-5)   26. Other intervertebral disc degeneration, lumbar region   27. Chronic pain syndrome   28. Pharmacologic therapy   29. Chronic anticoagulation (Plavix)   30. Disorder of skeletal system   31. Problems influencing health status    Plan of Care (Initial workup plan)  Note: Lauren Winters was reminded that as per protocol, today's visit has been an evaluation only. We have not taken over the patient's controlled substance  management.  Problem-specific plan: No problem-specific Assessment & Plan notes found for this encounter. Lab Orders  Compliance Drug Analysis, Ur  Comp. Metabolic Panel (12)  Magnesium  Vitamin B12  Sedimentation rate  25-Hydroxy vitamin D Lcms D2+D3  C-reactive protein  Protime-INR  APTT  Platelet count  Platelet function assay   Imaging Orders  DG Cervical Spine With Flex & Extend  DG Lumbar Spine Complete W/Bend  DG HIP UNILAT W OR W/O PELVIS 2-3 VIEWS RIGHT  DG HIP UNILAT W OR W/O PELVIS 2-3 VIEWS LEFT  DG Shoulder Right  DG Thoracic Spine 2 View  CT LUMBAR SPINE WO CONTRAST   Referral Orders  No referral(s) requested today   Procedure Orders    No procedure(s) ordered today   Pharmacotherapy (current): Medications ordered:  No orders of the defined types were placed in this encounter.  Medications administered during this visit: Jacklyn Shell had no medications administered during this visit.   Pharmacological management options:  Opioid Analgesics: The patient was informed that there is no guarantee that she would be a candidate for opioid analgesics. The decision will be made following CDC guidelines. This decision will be based on the results of diagnostic studies, as well as Lauren Winters's risk profile.   Membrane stabilizer: To be determined at a later time  Muscle relaxant: To be determined at a later time  NSAID: To be determined at a later time  Other analgesic(s): To be determined at a later time   Interventional management options: Lauren Winters was informed that there is no guarantee that she would be a candidate for interventional therapies. The decision will be based on the results of diagnostic studies, as well as Lauren Winters's risk profile.  Procedure(s) under consideration:  NOTE: The patient indicates being allergic to steroids   Provider-requested follow-up: Return for evaluation day (40  min) 2nd Visit "Plan of Care".  Future Appointments   Date Time Provider [place]  07/27/2021  8:40 AM Milinda Pointer, MD [facility] None  08/19/2021  1:00 PM Ofilia Neas, PA-C [facility] None  09/04/2021  2:20 PM Pleas Koch, NP [facility]    Note by: Gaspar Cola, MD Date: 06/10/2021; Time: 12:26 PM

## 2021-06-10 NOTE — Progress Notes (Signed)
Safety precautions to be maintained throughout the outpatient stay will include: orient to surroundings, keep bed in low position, maintain call bell within reach at all times, provide assistance with transfer out of bed and ambulation.  

## 2021-06-11 NOTE — Telephone Encounter (Signed)
I reviewed the patients lab results.  I would recommend obtaining SPEP and immunoglobulins at her next follow up visit.

## 2021-06-12 DIAGNOSIS — Z967 Presence of other bone and tendon implants: Secondary | ICD-10-CM | POA: Insufficient documentation

## 2021-06-12 DIAGNOSIS — T85733S Infection and inflammatory reaction due to implanted electronic neurostimulator of spinal cord, electrode (lead), sequela: Secondary | ICD-10-CM | POA: Insufficient documentation

## 2021-06-12 DIAGNOSIS — R700 Elevated erythrocyte sedimentation rate: Secondary | ICD-10-CM | POA: Insufficient documentation

## 2021-06-12 DIAGNOSIS — R7982 Elevated C-reactive protein (CRP): Secondary | ICD-10-CM

## 2021-06-12 DIAGNOSIS — M4125 Other idiopathic scoliosis, thoracolumbar region: Secondary | ICD-10-CM | POA: Insufficient documentation

## 2021-06-12 DIAGNOSIS — M5134 Other intervertebral disc degeneration, thoracic region: Secondary | ICD-10-CM | POA: Insufficient documentation

## 2021-06-12 DIAGNOSIS — M5030 Other cervical disc degeneration, unspecified cervical region: Secondary | ICD-10-CM | POA: Insufficient documentation

## 2021-06-12 DIAGNOSIS — M1611 Unilateral primary osteoarthritis, right hip: Secondary | ICD-10-CM | POA: Insufficient documentation

## 2021-06-12 HISTORY — DX: Elevated C-reactive protein (CRP): R79.82

## 2021-06-17 ENCOUNTER — Other Ambulatory Visit: Payer: Self-pay | Admitting: Physician Assistant

## 2021-06-17 LAB — COMPLIANCE DRUG ANALYSIS, UR

## 2021-06-17 NOTE — Telephone Encounter (Signed)
To early to refill. Left message to advise patient she is due to update labs.

## 2021-06-18 ENCOUNTER — Encounter: Payer: Self-pay | Admitting: Pain Medicine

## 2021-06-18 ENCOUNTER — Telehealth: Payer: Self-pay

## 2021-06-18 ENCOUNTER — Other Ambulatory Visit: Payer: Self-pay | Admitting: *Deleted

## 2021-06-18 DIAGNOSIS — M3219 Other organ or system involvement in systemic lupus erythematosus: Secondary | ICD-10-CM

## 2021-06-18 DIAGNOSIS — Z79899 Other long term (current) drug therapy: Secondary | ICD-10-CM | POA: Diagnosis not present

## 2021-06-18 NOTE — Telephone Encounter (Signed)
Patient advised we will be drawing multiple labs today including the SPEP recommended by Hazel Sams, PA-C.

## 2021-06-18 NOTE — Telephone Encounter (Signed)
Patient left a voicemail stating she plans to come in this afternoon for labwork.  Patient requested a return call to let her know if the labwork that Lovena Le wants her to have will be included.

## 2021-06-19 ENCOUNTER — Encounter: Payer: Self-pay | Admitting: Rheumatology

## 2021-06-19 NOTE — Telephone Encounter (Signed)
We are waiting on SPEP results.  Once all the results are available we will contact her.

## 2021-06-22 ENCOUNTER — Other Ambulatory Visit: Payer: Self-pay | Admitting: *Deleted

## 2021-06-22 LAB — CBC WITH DIFFERENTIAL/PLATELET
Absolute Monocytes: 508 cells/uL (ref 200–950)
Basophils Absolute: 62 cells/uL (ref 0–200)
Basophils Relative: 0.8 %
Eosinophils Absolute: 100 cells/uL (ref 15–500)
Eosinophils Relative: 1.3 %
HCT: 43 % (ref 35.0–45.0)
Hemoglobin: 14.2 g/dL (ref 11.7–15.5)
Lymphs Abs: 2610 cells/uL (ref 850–3900)
MCH: 27.6 pg (ref 27.0–33.0)
MCHC: 33 g/dL (ref 32.0–36.0)
MCV: 83.5 fL (ref 80.0–100.0)
MPV: 9.7 fL (ref 7.5–12.5)
Monocytes Relative: 6.6 %
Neutro Abs: 4420 cells/uL (ref 1500–7800)
Neutrophils Relative %: 57.4 %
Platelets: 393 10*3/uL (ref 140–400)
RBC: 5.15 10*6/uL — ABNORMAL HIGH (ref 3.80–5.10)
RDW: 13.3 % (ref 11.0–15.0)
Total Lymphocyte: 33.9 %
WBC: 7.7 10*3/uL (ref 3.8–10.8)

## 2021-06-22 LAB — C3 AND C4
C3 Complement: 173 mg/dL (ref 83–193)
C4 Complement: 25 mg/dL (ref 15–57)

## 2021-06-22 LAB — URINALYSIS, ROUTINE W REFLEX MICROSCOPIC
Bacteria, UA: NONE SEEN /HPF
Bilirubin Urine: NEGATIVE
Hgb urine dipstick: NEGATIVE
Hyaline Cast: NONE SEEN /LPF
Ketones, ur: NEGATIVE
Leukocytes,Ua: NEGATIVE
Nitrite: NEGATIVE
RBC / HPF: NONE SEEN /HPF (ref 0–2)
Specific Gravity, Urine: 1.036 — ABNORMAL HIGH (ref 1.001–1.035)
WBC, UA: NONE SEEN /HPF (ref 0–5)
pH: <=5 (ref 5.0–8.0)

## 2021-06-22 LAB — PROTEIN ELECTROPHORESIS, SERUM, WITH REFLEX
Albumin ELP: 4.3 g/dL (ref 3.8–4.8)
Alpha 1: 0.3 g/dL (ref 0.2–0.3)
Alpha 2: 0.8 g/dL (ref 0.5–0.9)
Beta 2: 0.5 g/dL (ref 0.2–0.5)
Beta Globulin: 0.5 g/dL (ref 0.4–0.6)
Gamma Globulin: 1.5 g/dL (ref 0.8–1.7)
Total Protein: 7.9 g/dL (ref 6.1–8.1)

## 2021-06-22 LAB — IGG, IGA, IGM
IgG (Immunoglobin G), Serum: 1633 mg/dL (ref 600–1640)
IgM, Serum: 53 mg/dL (ref 50–300)
Immunoglobulin A: 478 mg/dL — ABNORMAL HIGH (ref 47–310)

## 2021-06-22 LAB — MICROSCOPIC MESSAGE

## 2021-06-22 LAB — ANTI-DNA ANTIBODY, DOUBLE-STRANDED: ds DNA Ab: <1 IU/mL

## 2021-06-22 LAB — RHEUMATOID FACTOR: Rheumatoid fact SerPl-aCnc: 58 IU/mL — ABNORMAL HIGH (ref <14)

## 2021-06-22 LAB — SEDIMENTATION RATE: Sed Rate: 31 mm/h — ABNORMAL HIGH (ref 0–30)

## 2021-06-22 MED ORDER — METHOTREXATE 2.5 MG PO TABS: 20.0000 mg | ORAL_TABLET | ORAL | 2 refills | Status: DC

## 2021-06-22 NOTE — Telephone Encounter (Signed)
Next Visit: 08/19/2021  Last Visit: 03/19/2021  Last Fill: 04/13/2021  DX: Other organ or system involvement in systemic lupus erythematosus  Current Dose per office note 03/19/2021: methotrexate 8 tabs by mouth once weekly  Labs:06/18/2021 CBC is normal, CMP 06/10/2021 Glucose 416 Total Protein 9.0  Okay to refill MTX?

## 2021-06-22 NOTE — Progress Notes (Signed)
SPEP is normal, IgA is mildly elevated which is not significant.  Sed rate is mildly elevated which is not significant.  Rheumatoid factor is positive at 58.  Complements are normal, double-stranded DNA is negative, urine showed 2+ glucose.  CBC is normal.  No change in treatment advised.  Please forward results to her PCP.

## 2021-06-27 ENCOUNTER — Other Ambulatory Visit: Payer: Self-pay | Admitting: Primary Care

## 2021-06-27 DIAGNOSIS — F419 Anxiety disorder, unspecified: Secondary | ICD-10-CM

## 2021-06-27 DIAGNOSIS — F3342 Major depressive disorder, recurrent, in full remission: Secondary | ICD-10-CM

## 2021-07-09 ENCOUNTER — Other Ambulatory Visit: Payer: Self-pay

## 2021-07-09 ENCOUNTER — Ambulatory Visit
Admission: RE | Admit: 2021-07-09 | Discharge: 2021-07-09 | Disposition: A | Discharge status: Home / Self Care | Payer: Medicare Other | Source: Ambulatory Visit | Attending: Pain Medicine | Admitting: Pain Medicine

## 2021-07-09 DIAGNOSIS — M549 Dorsalgia, unspecified: Secondary | ICD-10-CM | POA: Diagnosis not present

## 2021-07-09 DIAGNOSIS — M961 Postlaminectomy syndrome, not elsewhere classified: Secondary | ICD-10-CM | POA: Diagnosis not present

## 2021-07-09 DIAGNOSIS — M79604 Pain in right leg: Secondary | ICD-10-CM | POA: Insufficient documentation

## 2021-07-09 DIAGNOSIS — M4125 Other idiopathic scoliosis, thoracolumbar region: Secondary | ICD-10-CM | POA: Diagnosis not present

## 2021-07-09 DIAGNOSIS — M545 Low back pain, unspecified: Secondary | ICD-10-CM | POA: Diagnosis not present

## 2021-07-09 DIAGNOSIS — M5136 Other intervertebral disc degeneration, lumbar region: Secondary | ICD-10-CM | POA: Diagnosis not present

## 2021-07-09 DIAGNOSIS — G8929 Other chronic pain: Secondary | ICD-10-CM | POA: Diagnosis not present

## 2021-07-09 DIAGNOSIS — Z967 Presence of other bone and tendon implants: Secondary | ICD-10-CM | POA: Diagnosis not present

## 2021-07-25 DIAGNOSIS — Z7189 Other specified counseling: Secondary | ICD-10-CM | POA: Insufficient documentation

## 2021-07-25 NOTE — Progress Notes (Signed)
PROVIDER NOTE: Information contained herein reflects review and annotations entered in association with encounter. Interpretation of such information and data should be left to medically-trained personnel. Information provided to patient can be located elsewhere in the medical record under "Patient Instructions". Document created using STT-dictation technology, any transcriptional errors that may result from process are unintentional.    Patient: Lauren Winters  Service Category: E/M  Provider: Gaspar Cola, MD  DOB: 26-Feb-1966  DOS: 07/27/2021  Specialty: Interventional Pain Management  MRN: [id number]  Setting: Ambulatory outpatient  PCP: Pleas Koch, NP  Type: Established Patient    Referring Provider: Pleas Koch, NP  Location: Office  Delivery: Face-to-face     Primary Reason(s) for Visit: Encounter for evaluation before starting new chronic pain management plan of care (Level of risk: moderate) CC: Back Pain  HPI  Lauren Winters is a 55 y.o. year old, female patient, who comes today for a follow-up evaluation to review the test results and decide on a treatment plan. She has Mitral regurgitation; Atypical chest pain; Obesity (BMI 30-39.9); Anxiety; Essential hypertension; 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[place]); Lupus ([place]); Rheumatoid factor positive; Sjogren's syndrome ([place]); Hyperlipidemia; Chronic mid back pain (5th area of Pain) (Bilateral); Depression; Chest pain; Preventative health care; Primary osteoarthritis of both hands; Primary osteoarthritis of both knees; Spondylosis of lumbar region without myelopathy or radiculopathy; Fibromyalgia; Chronic shoulder pain (Right); HNP (herniated nucleus pulposus), lumbar; High risk medication use; History of lumbar spinal fusion (L3-4, L4-5); History of stroke; Paresthesias; Monoclonal gammopathy; Adenopathy, cervical; Cough; Herniation of nucleus pulposus of cervical intervertebral disc without myelopathy; Disorder of  sacroiliac joint; Trochanteric bursitis; Chronic hand pain (Bilateral); Bilateral primary osteoarthritis of hip; Displacement of lumbar intervertebral disc without myelopathy; Headache disorder; Left arm weakness; Local infection of wound; Lumbar post-laminectomy syndrome (L3-4, L4-5); Lumbosacral spondylosis with radiculopathy; Stage 3 chronic kidney disease ([place]); Chronic hip pain (2ry area of Pain) (Bilateral); Failed back surgical syndrome; Chronic low back pain (1ry area of Pain) (Bilateral) (R>L) w/o sciatica; Rheumatoid arthritis involving multiple sites with positive rheumatoid factor ([place]); Numbness and tingling in left arm; Chronic pain syndrome; Pharmacologic therapy; Disorder of skeletal system; Problems influencing health status; Elevated hemoglobin A1c; Vitamin D deficiency; Positive ANA (antinuclear antibody); Elevated rheumatoid factor; Elevated d-dimer; Chronic anticoagulation (Plavix); Chronic lower extremity pain (3ry area of Pain) (Right); Diabetic peripheral neuropathy (4th area of Pain) ([place]); Chronic, intermittent, rib pain (8th area of Pain) (Bilateral); Thoracic radiculitis (T6) (intermittent) (Bilateral); Chronic neck pain (6th area of Pain) (posterior) (Midline) (R>L); Cervicalgia; Chronic upper extremity pain (7th area of Pain) (Right); History of infection of neurostimulator battery w/ removal of device; Elevated C-reactive protein (CRP); Elevated sed rate; DDD (degenerative disc disease), thoracic; DDD (degenerative disc disease), cervical; Osteoarthritis of hip (Right); Other idiopathic scoliosis, thoracolumbar region; Fixation hardware in spine (L3-4 and L4-5); and Encounter for pain management counseling on their problem list. Her primarily concern today is the Back Pain  Pain Assessment: Location: Right, Left, Lower Back Radiating: pain radiaties down right hip and thigh Onset: More than a month ago Duration: Chronic pain Quality: Aching, Burning, Constant, Throbbing,  Sharp Severity: 7 /10 (subjective, self-reported pain score)  Effect on ADL: limits my daily activities Timing: Constant Modifying factors: laying down, reclinging chair BP: (!) 151/68  HR: 71  Lauren Winters comes in today for a follow-up visit after her initial evaluation on 06/10/2021. Today we went over the results of her tests. These were explained in "Layman's terms". During today's  appointment we went over my diagnostic impression, as well as the proposed treatment plan.  Review of initial evaluation: "According to the patient her primary area of pain is that of the lower back (Bilateral) (R>L).  She has a history positive for 3 different back surgeries performed by [facility].  The first surgery was in 2016 and the last 1 on 2019.  The last surgery was done by Dr. Ronnald Ramp.  Prior to the first surgery her primary symptoms were that of low back pain and right lower extremity pain with the low back pain being worse than the leg pain.  At the time she was experiencing weakness and pain down the right leg to the area of the knee through the anterior aspect of the leg.  After that first surgery she attained 70% relief of the right lower extremity pain and absolutely no relief of the low back pain.  At the time of her second surgery her primary symptom was that of the low back pain with referral of pain down into both hips with the left hip being worse than the right.  After that surgery the patient indicated having attained no low back pain relief for hip pain relief.  The patient indicated that the second surgery completely failed provide her with any benefits.  At the time of her third surgery she was again experiencing lower extremity pain and low back pain with lower extremity pain being worse than the back pain and this time contrary to the time of her first surgery, at this time the pain was going down the left lower extremity instead of the right.  This pain appears to go to the  anterior thigh area.  After the surgery the patient attained 100% relief of the left lower extremity pain but absolutely no benefit from the low back pain.  According to the patient, she had a two-level fusion at L3/L4 (?).  The patient indicates having had physical therapy for this back pain which actually made things a lot worse.  She has had some x-rays and MRIs done in the past.  She also indicates that some of those were last done by Dr. Heide Guile at [facility].  The patient also indicates having had multiple injections in her back done at [facility], where they told her that she was "allergic to steroids".  She indicates that this "allergy" consist of a severe increase in her blood pressure with angina pectoris.  She indicates that this happened every time that she had the steroids injected.  She describes that she was being seen by Dr. Ermalene Postin (?).   In addition to the above lumbar spine surgery she had a bilateral spinal cord stimulator implanted around December 2018 with the trial having been done by Dr. Brien Few and the implant by Dr. Ronnald Ramp.  Unfortunately, she suffered an infection of the battery pocket resulting in the removal of the spinal cord stimulator around January 2019 (4 weeks).  She refers that she was referred here because her new insurance does not seem to cover Dr. Brien Few anymore.   The patient's secondary area pain is that of the hips (Bilateral) (R>L).  She denies any surgeries but does admit having had injections done in both hips, also at [facility].  She indicated that they helped for a few weeks.  She also indicates having had physical therapy which did not help.  No recent x-rays of the hips.   The patient's third area of pain is that of  the lower extremities (Right).  She indicates currently having no pain on the left side.  She describes the pain as being in the anterior thigh with weakness but no numbness.  She describes having had a nerve conduction test at [facility].  According to  the medical records the patient had an EMG done on 10/03/2019 which read:   "Summary of NCS:  After identifying the patient in the waiting room and reviewing all appropriately available medical records, the patient was taken back to the examination room where the procedure was explained, the sites of examination were noted, the patient's questions were answered and the patient's verbal consent for the procedure was obtained. Studies were performed at [facility] using a radiant warmer and CareFusion Synergy EMG System.   Sensory nerve conduction studies of the left median, ulnar, sural, and superficial fibular nerves demonstrate normal peak latencies and normal SNAP amplitudes.   Motor nerve conduction studies of the left median, ulnar, common fibular and tibial nerves demonstrate normal distal motor latencies, normal CMAP amplitudes, and normal conduction velocities. Minimal F-wave latencies are normal in all nerves tested.   Needle EMG of the selected muscles in the left upper and lower extremity show no abnormal spontaneous activity, normal MUP forms, and normal recruitment.   Conclusions:  This is a normal study. These electrodiagnostic findings show no evidence of large fiber polyneuropathy, left arm or left leg compression neuropathy, or left cervical or lumbosacral radiculopathy. Small fiber neuropathy is not assessed by these techniques."   The patient's fourth area of pain is that of both hands and feet.  She describes that this is secondary to her diabetic peripheral neuropathy.  She describes having numbness and tingling in both hands and feet.   The patient's fifth area pain is that of the mid upper back (Bilateral) (R=L).  She denies any surgeries, injections, or physical therapy.  She indicates that she may have had some x-rays done by Dr. Tally Joe at [facility].   The patient has sixth area pain is that of the neck and the posterior aspect with the pain being primarily midline but radiating towards  the right shoulder.  She denies any prior surgeries, nerve blocks, physical therapy, but again she thinks that she might have had some x-rays done at [facility].   The patient's seventh area of pain is that of the upper extremities (Bilateral) (R>L).  She describes the pain to radiate from the shoulder down to the mid upper arm on the lateral aspect of the arm.  She describes having pain and numbness in both hands which she attributes to the peripheral neuropathy.  She refers having pain primarily in the area of the right shoulder.   The patient's eighth area of pain is that of her ribs (Bilateral) (R=L).  She describes this pain to be worse with walking.  The pain is intermittent and it seems to start in the posterolateral aspect of her ribs and travel to the anterior portion of the body ending over the costochondral joint.  The pain seems to be in the rib cage at the level of T6.   In addition to the above, the patient has a history of having had a stroke around 2019 that left her with left body weakness requiring that she move around using a walker.  Other medical problems include insulin dependent diabetes mellitus with a resting blood sugar on the morning of her initial evaluation of 340 mg/dl."  In summary, the patient has multiple areas of pain with  the worst apparently being secondary to a failed back surgical syndrome with bilateral low back pain, right lower extremity pain, and bilateral hip pain.  Patient indicates being unable to tolerate steroids and she is anticoagulated using Plavix and Plaquenil with a history of stroke and mitral valve insufficiency.  She has a positive ANA, positive rheumatoid factor, type 2 diabetes with stage III chronic kidney disease.  She also has a diagnosis of rheumatoid arthritis involving multiple sites, lupus, and Sjogren's syndrome.  Because of the patient's intolerance to opioids and anticoagulation, she is a high risk patient for further interventional therapies.   Based on my evaluation, the patient would be an appropriate candidate for the judicious use of opioid analgesics.  Unfortunately, I currently do not have any more space in my practice to add patients for medication management only.  The patient has elevated levels of CRP and sed rate, suggestive of acute on chronic inflammation.  Typically this would be treated with nonsteroidal anti-inflammatory drugs however in her case because of her anticoagulation these are contraindicated.  The next option would be the steroids however, she also indicates being intolerant to those.  Because of the complexity and involvement of rheumatological problems, I do believe that this patient would be better served with the long-term care being provided by a rheumatologist.  In addition, the patient should consider a diet based on inflammatory factor calculations, so as to concentrate on an anti-inflammatory diet. (https://inflammationfactor.com/diet-and-inflammation/)  Today I took the time to go over the lab results as well as the for the x-ray results.  I have explained these to the patient in layman's terms.  I have answered all of her questions.  I have also informed the patient that based on my evaluation, I would recommend that she follow up with a rheumatologist.  My evaluation also shows the patient to be an adequate candidate for the judicious use of opioid analgesics.  At this point I would recommend a trial of tramadol 50 mg, 1 tablet p.o. 3 times daily to 4 times daily as needed for pain.  Controlled Substance Pharmacotherapy Assessment REMS (Risk Evaluation and Mitigation Strategy)  Analgesic: None  Pill Count: None expected due to no prior prescriptions written by our practice. Chauncey Fischer, RN  07/27/2021  8:48 AM  Sign when Signing Visit Safety precautions to be maintained throughout the outpatient stay will include: orient to surroundings, keep bed in low position, maintain call bell within reach at all  times, provide assistance with transfer out of bed and ambulation.      Monitoring: [place] PMP: PDMP reviewed during this encounter. Online review of the past 61-monthperiod previously conducted. Not applicable at this point since we have not taken over the patient's medication management yet. List of other Serum/Urine Drug Screening Test(s):  No results found for: AMPHSCRSER, BARBSCRSER, BENZOSCRSER, COCAINSCRSER, COCAINSCRNUR, PCPSCRSER, THCSCRSER, THCU, CANNABQUANT, [place] of all UDS test(s) done:  Lab Results  Component Value Date   SUMMARY Note 06/10/2021   Last UDS on record: Summary  Date Value Ref Range Status  06/10/2021 Note  Final    Comment:    ==================================================================== Compliance Drug Analysis, Ur ==================================================================== Test                             Result       Flag       Units  Drug Present and Declared for Prescription Verification  Bupropion                      PRESENT      EXPECTED   Hydroxybupropion               PRESENT      EXPECTED    Hydroxybupropion is an expected metabolite of bupropion.  Drug Present not Declared for Prescription Verification   Acetaminophen                  PRESENT      UNEXPECTED  Drug Absent but Declared for Prescription Verification   Cyclobenzaprine                Not Detected UNEXPECTED   Venlafaxine                    Not Detected UNEXPECTED   Metoprolol                     Not Detected UNEXPECTED ==================================================================== Test                      Result    Flag   Units      Ref Range   Creatinine              35               mg/dL      >=20 ==================================================================== Declared Medications:  The flagging and interpretation on this report are based on the  following declared medications.  Unexpected results may  arise from  inaccuracies in the declared medications.   **Note: The testing scope of this panel includes these medications:   Bupropion (Wellbutrin)  Cyclobenzaprine (Flexeril)  Metoprolol (Toprol)  Venlafaxine (Effexor)   **Note: The testing scope of this panel does not include the  following reported medications:   Amlodipine (Norvasc)  Clopidogrel (Plavix)  Folic Acid  Hydroxychloroquine (Plaquenil)  Insulin (Lantus)  Methotrexate  Rosuvastatin (Crestor)  Semaglutide (Ozempic)  Spironolactone (Aldactone)  Telmisartan (Micardis) ==================================================================== For clinical consultation, please call [phone]. ====================================================================    UDS interpretation: No unexpected findings.          Medication Assessment Form: Not applicable. No opioids. Treatment compliance: Not applicable Risk Assessment Profile: Aberrant behavior: See initial evaluations. None observed or detected today Comorbid factors increasing risk of overdose: See initial evaluation. No additional risks detected today Opioid risk tool (ORT):  Opioid Risk  06/10/2021  Alcohol 0  Illegal Drugs 0  Rx Drugs 0  Alcohol 0  Illegal Drugs 0  Rx Drugs 0  Age between 16-45 years  0  History of Preadolescent Sexual Abuse 0  Psychological Disease 2  Depression 1  Opioid Risk Tool Scoring 3  Opioid Risk Interpretation Low Risk    ORT Scoring interpretation table:  Score <3 = Low Risk for SUD  Score between 4-7 = Moderate Risk for SUD  Score >8 = High Risk for Opioid Abuse   Risk of substance use disorder (SUD): Low  Risk Mitigation Strategies:  Patient opioid safety counseling: No controlled substances prescribed. Patient-Prescriber Agreement (PPA): No agreement signed.  Controlled substance notification to other providers: None required. No opioid therapy.  Pharmacologic Plan: No opioid analgesic prescribed.              Laboratory Chemistry Profile   Renal Lab Results  Component Value Date   BUN 18 06/10/2021   CREATININE 0.82  06/10/2021   LABCREA 44 35/36/1443   BCR NOT APPLICABLE 15/40/0867   GFR 90.11 02/17/2021   GFRAA 103 10/20/2020   GFRNONAA >60 06/10/2021   SPECGRAV 1.020 09/19/2017   PHUR 6.5 09/19/2017   PROTEINUR TRACE (A) 06/18/2021     Electrolytes Lab Results  Component Value Date   NA 135 06/10/2021   K 4.0 06/10/2021   CL 98 06/10/2021   CALCIUM 9.7 06/10/2021   MG 1.9 06/10/2021     Hepatic Lab Results  Component Value Date   AST 19 06/10/2021   ALT 13 06/10/2021   ALBUMIN 4.4 06/10/2021   ALKPHOS 102 06/10/2021     ID Lab Results  Component Value Date   LYMEIGGIGMAB <0.91 06/01/2017   HIV Non Reactive 03/03/2019   SARSCOV2NAA Not Detected 01/14/2021   STAPHAUREUS NEGATIVE 05/17/2018   MRSAPCR NEGATIVE 05/17/2018     Bone Lab Results  Component Value Date   VD25OH 14.79 (L) 06/10/2021   VD125OH2TOT 44 02/01/2020   [id number] 44 02/01/2020   [id number] <8 02/01/2020     Endocrine Lab Results  Component Value Date   GLUCOSE 416 (H) 06/10/2021   GLUCOSEU 2+ (A) 06/18/2021   HGBA1C 10.4 (A) 06/03/2021   TSH 0.99 02/17/2021     Neuropathy Lab Results  Component Value Date   VITAMINB12 226 06/10/2021   HGBA1C 10.4 (A) 06/03/2021   HIV Non Reactive 03/03/2019     CNS No results found for: COLORCSF, APPEARCSF, RBCCOUNTCSF, WBCCSF, POLYSCSF, LYMPHSCSF, EOSCSF, PROTEINCSF, GLUCCSF, JCVIRUS, CSFOLI, IGGCSF, LABACHR, ACETBL, LABACHR, ACETBL   Inflammation (CRP: Acute  ESR: Chronic) Lab Results  Component Value Date   CRP 1.3 (H) 06/10/2021   ESRSEDRATE 31 (H) 06/18/2021   LATICACIDVEN 0.93 01/11/2018     Rheumatology Lab Results  Component Value Date   RF 58 (H) 06/18/2021   ANA POSITIVE (A) 02/01/2020   LYMEIGGIGMAB <0.91 06/01/2017     Coagulation Lab Results  Component Value Date   INR 1.0 06/10/2021   LABPROT 13.2 06/10/2021    APTT 30 06/10/2021   PLT 393 06/18/2021   DDIMER 0.51 (H) 01/14/2021     Cardiovascular Lab Results  Component Value Date   TROPONINI <0.03 08/19/2018   HGB 14.2 06/18/2021   HCT 43.0 06/18/2021     Screening Lab Results  Component Value Date   SARSCOV2NAA Not Detected 01/14/2021   STAPHAUREUS NEGATIVE 05/17/2018   MRSAPCR NEGATIVE 05/17/2018   HIV Non Reactive 03/03/2019     Cancer No results found for: CEA, CA125, LABCA2   Allergens No results found for: ALMOND, APPLE, ASPARAGUS, AVOCADO, BANANA, BARLEY, BASIL, BAYLEAF, GREENBEAN, LIMABEAN, WHITEBEAN, BEEFIGE, REDBEET, BLUEBERRY, BROCCOLI, CABBAGE, MELON, CARROT, CASEIN, CASHEWNUT, CAULIFLOWER, CELERY     Note: Lab results reviewed.  Recent Diagnostic Imaging Review  Cervical Imaging: Cervical MR w/wo contrast: Results for orders placed during the hospital encounter of 03/02/19 MR CERVICAL SPINE W WO CONTRAST  Narrative CLINICAL DATA:  Persistent left-sided weakness and sensory loss. History of lupus. Evaluate for cord demyelinating lesions.  EXAM: MRI CERVICAL AND THORACIC SPINE WITH AND WITHOUT CONTRAST  TECHNIQUE: Multiplanar and multiecho pulse sequences of the cervical spine, to include the craniocervical junction and cervicothoracic junction, and thoracic spine, were obtained with and without intravenous contrast.  CONTRAST:  9 mL Gadavist intravenous contrast.  COMPARISON:  MRI thoracic spine dated October 26, 2017. MRI cervical spine dated March 15, 2017.  FINDINGS: MRI CERVICAL SPINE FINDINGS  Alignment: Straightening of the normal cervical lordosis.  Vertebrae:  No fracture, evidence of discitis, or bone lesion.  Cord: Normal signal and morphology.  No intradural enhancement.  Posterior Fossa, vertebral arteries, paraspinal tissues: Negative.  Disc levels:  C2-C3:  Negative.  C3-C4: Negative disc. Mild bilateral uncovertebral hypertrophy. No stenosis.  C4-C5:  Minimal disc bulging.  No  stenosis.  C5-C6: Unchanged diffuse disc bulging and mild bilateral uncovertebral hypertrophy resulting in mild-to-moderate spinal canal stenosis and mild to moderate bilateral neuroforaminal stenosis.  C6-C7: Unchanged shallow left paracentral disc protrusion. No stenosis.  C7-T1:  Negative.  MRI THORACIC SPINE FINDINGS  Alignment:  Physiologic.  Vertebrae: No fracture, evidence of discitis, or bone lesion.  Cord:  Normal signal and morphology.  No intradural enhancement.  Paraspinal and other soft tissues: Negative.  Disc levels:  No significant disc bulge or herniation. No spinal canal or neuroforaminal stenosis.  IMPRESSION: 1. Normal cervicothoracic spinal cord. No evidence of demyelinating disease. 2. Unchanged cervical spondylosis with mild-to-moderate stenosis at C5-C6.   Electronically Signed By: Titus Dubin M.D. On: 03/04/2019 17:26  Cervical DG Bending/F/E views: Results for orders placed during the hospital encounter of 06/10/21 DG Cervical Spine With Flex & Extend  Narrative CLINICAL DATA:  Neck pain.  EXAM: CERVICAL SPINE COMPLETE WITH FLEXION AND EXTENSION VIEWS  COMPARISON:  Cervical spine MRI examination 03/15/2017 and prior cervical spine radiographs from February 2018.  FINDINGS: Mild straightening of the normal cervical lordosis could be due to a slight head forward position of the patient, muscle spasm or pain. The cervical vertebral bodies are normally aligned. No acute bony findings or destructive bony changes. Stable mild degenerative disc disease noted at C5-6.  Flexion and extension films do not demonstrate any evidence of instability/subluxation.  No abnormal prevertebral soft tissue swelling is identified. The oblique films demonstrate normally aligned articular facets and grossly patent bony neural foramen.  The C1-2 articulations are maintained. The lung apices are clear. Small bilateral cervical ribs are  noted.  IMPRESSION: 1. No acute bony findings or abnormal prevertebral soft tissue swelling. 2. Stable mild degenerative disc disease at C5-6. 3. No instability/subluxation with flexion/extension.   Electronically Signed By: Marijo Sanes M.D. On: 06/11/2021 09:30  Shoulder Imaging: Shoulder-R DG: Results for orders placed during the hospital encounter of 06/10/21 DG Shoulder Right  Narrative CLINICAL DATA:  Right shoulder pain.  EXAM: RIGHT SHOULDER - 2+ VIEW  COMPARISON:  None.  FINDINGS: The joint spaces are maintained. No acute bony findings or bone lesion. No abnormal soft tissue calcifications. The visualized lung is clear and the visualized ribs are intact.  IMPRESSION: Normal right shoulder radiographs.   Electronically Signed By: Marijo Sanes M.D. On: 06/11/2021 09:37  Thoracic Imaging: Thoracic MR w/wo contrast: Results for orders placed during the hospital encounter of 03/02/19 MR THORACIC SPINE W WO CONTRAST  Narrative CLINICAL DATA:  Persistent left-sided weakness and sensory loss. History of lupus. Evaluate for cord demyelinating lesions.  EXAM: MRI CERVICAL AND THORACIC SPINE WITH AND WITHOUT CONTRAST  TECHNIQUE: Multiplanar and multiecho pulse sequences of the cervical spine, to include the craniocervical junction and cervicothoracic junction, and thoracic spine, were obtained with and without intravenous contrast.  CONTRAST:  9 mL Gadavist intravenous contrast.  COMPARISON:  MRI thoracic spine dated October 26, 2017. MRI cervical spine dated March 15, 2017.  FINDINGS: MRI CERVICAL SPINE FINDINGS  Alignment: Straightening of the normal cervical lordosis.  Vertebrae: No fracture, evidence of discitis, or bone lesion.  Cord: Normal signal and morphology.  No intradural enhancement.  Posterior Fossa,  vertebral arteries, paraspinal tissues: Negative.  Disc levels:  C2-C3:  Negative.  C3-C4: Negative disc. Mild bilateral  uncovertebral hypertrophy. No stenosis.  C4-C5:  Minimal disc bulging.  No stenosis.  C5-C6: Unchanged diffuse disc bulging and mild bilateral uncovertebral hypertrophy resulting in mild-to-moderate spinal canal stenosis and mild to moderate bilateral neuroforaminal stenosis.  C6-C7: Unchanged shallow left paracentral disc protrusion. No stenosis.  C7-T1:  Negative.  MRI THORACIC SPINE FINDINGS  Alignment:  Physiologic.  Vertebrae: No fracture, evidence of discitis, or bone lesion.  Cord:  Normal signal and morphology.  No intradural enhancement.  Paraspinal and other soft tissues: Negative.  Disc levels:  No significant disc bulge or herniation. No spinal canal or neuroforaminal stenosis.  IMPRESSION: 1. Normal cervicothoracic spinal cord. No evidence of demyelinating disease. 2. Unchanged cervical spondylosis with mild-to-moderate stenosis at C5-C6.   Electronically Signed By: Titus Dubin M.D. On: 03/04/2019 17:26  Lumbosacral Imaging: Lumbar MR wo contrast: Results for orders placed during the hospital encounter of 09/29/15 MR Lumbar Spine Wo Contrast  Narrative CLINICAL DATA:  Recent exacerbation of chronic pain. Bilateral low back and buttock pain. Right groin and anterior thigh pain. Right leg weakness. Numbness in the right toes.  EXAM: MRI LUMBAR SPINE WITHOUT CONTRAST  TECHNIQUE: Multiplanar, multisequence MR imaging of the lumbar spine was performed. No intravenous contrast was administered.  COMPARISON:  Lumbar spine radiographs 09/17/2015.  Report of MRI lumbar spine 01/10/2007  FINDINGS: Normal signal is present in the conus medullaris which terminates at L1, within normal limits. Marrow signal, vertebral body heights, alignment are normal. Mild leftward curvature of the lumbar spine is centered at L2-3.  Limited imaging of the abdomen is unremarkable.  L1-2: Mild disc bulging is present without significant stenosis.  L2-3: A mild  broad-based disc protrusion is present. There is some distortion of the central canal without focal stenosis. Asymmetric right-sided facet hypertrophy is noted.  L3-4: An asymmetric right-sided disc protrusion is present. Moderate facet hypertrophy is noted bilaterally. This results in moderate right mild left subarticular stenosis. A superior disc extrusion or more likely free fragment is present in the right lateral recess posterior to the L3 vertebral body, likely impacting the traversing right L3 nerve root. Mild right foraminal stenosis is present as well. The left foramen is patent.  L4-5: A broad-based disc protrusion is asymmetric to the left. Mild subarticular narrowing is worse on the left. Mild foraminal narrowing is also worse on the left.  L5-S1: A shallow central disc protrusion is present. Mild facet hypertrophy is noted bilaterally. The foramina are patent.  IMPRESSION: 1. Prominent rightward disc protrusion at L3-4 with moderate right subarticular narrowing potentially affecting the right L4 nerve root. 2. Superior disc extrusion or more likely free fragment resulting and moderate right lateral recess stenosis posterior to the right L3 vertebral body, likely impacting the right L3 nerve root. 3. Mild subarticular and foraminal narrowing at L4-5, worse on the left. 4. Broad-based disc protrusion and facet hypertrophy at L2-3 without significant stenosis. 5. Shallow central disc protrusion and mild facet hypertrophy at L5-S1 without significant stenosis.   Electronically Signed By: San Morelle M.D. On: 09/29/2015 12:03  Lumbar MR w/wo contrast: Results for orders placed during the hospital encounter of 04/07/18 MR Lumbar Spine W Wo Contrast  Narrative CLINICAL DATA:  Spondylolisthesis of lumbar region. Low back pain radiating to the bilateral hips.  EXAM: MRI LUMBAR SPINE WITHOUT AND WITH CONTRAST  TECHNIQUE: Multiplanar and multiecho pulse  sequences of the lumbar  spine were obtained without and with intravenous contrast.  CONTRAST:  79m MULTIHANCE GADOBENATE DIMEGLUMINE 529 MG/ML IV SOLN  COMPARISON:  10/26/2017  FINDINGS: Segmentation:  5 lumbar type vertebral bodies.  Alignment:  Mild levoscoliosis.  Vertebrae: No evidence of fracture, discitis, or aggressive bone lesion.  Conus medullaris and cauda equina: Conus extends to the L1 level. Conus and cauda equina appear normal.  Paraspinal and other soft tissues: Asymmetric strandy T2 hyperintensity ventral is chronic and stable.  Disc levels:  T12- L1: Unremarkable.  L1-L2: Disc narrowing and desiccation with a right paracentral upward migrating disc protrusion that is new. Increased right subarticular recess stenosis but no static L2 compression.  L2-L3: Chronic disc narrowing and asymmetric rightward bulging. Degenerative facet spurring greater on the right. Right subarticular recess narrowing without definite compression. Right foraminal narrowing is mild.  L3-L4: Advanced degenerative disc collapse. Chronic right paracentral disc protrusion that is regressed. The spinal canal is patent after decompressive laminectomy. There is moderate right foraminal narrowing with deflection posteriorly of the right L3 nerve root, stable. No right L3 compression-status post partial right facetectomy.  L4-L5: Disc narrowing and bulging. Degenerative facet hypertrophy. Patent spinal canal after laminectomy. Moderate left foraminal narrowing due to disc height loss and bulge primarily, borderline progressed.  L5-S1:Mild disc narrowing and bulging. Mild facet spurring. No impingement.  IMPRESSION: 1. Diffuse disc and facet degeneration with mild levoscoliosis. 2. L1-2 right paracentral superiorly migrating disc extrusion that is new/progressed from 10/26/2017. There is right subarticular recess narrowing without static L2 compression. 3. L2-3 stable right  subarticular recess narrowing from asymmetric rightward disc bulging. 4. L3-4 decompressive laminectomy with patent spinal canal; a right paracentral disc protrusion at this level has regressed since 10/26/2017. Patent right foramen after right facetectomy; the right L3 nerve root is chronically deflected by degenerative endplate spurring. 5. L4-5 patent spinal canal after laminectomy. Moderate left foraminal narrowing that is borderline progressed.   Electronically Signed By: JMonte FantasiaM.D. On: 04/07/2018 13:03  Lumbar CT wo contrast: Results for orders placed during the hospital encounter of 07/09/21 CT LUMBAR SPINE WO CONTRAST  Narrative CLINICAL DATA:  55year old female with persistent low back pain. Prior surgery. Plain radiographs last month suggested loosening of lumbar pedicle screws. Subsequent encounter.  EXAM: CT LUMBAR SPINE WITHOUT CONTRAST  TECHNIQUE: Multidetector CT imaging of the lumbar spine was performed without intravenous contrast administration. Multiplanar CT image reconstructions were also generated.  COMPARISON:  Lumbar radiographs 06/10/2021. Postoperative lumbar spine CT 05/26/2018.  FINDINGS: Segmentation: Normal, the same numbering used on the comparisons.  Alignment: Mild levoconvex lumbar scoliosis is now moderate and has increased since 2019. Lumbar lordosis is relatively stable since that time. Subtle retrolisthesis of L2 on L3 is new since 2019.  Vertebrae: Postoperative details are below. No acute osseous abnormality identified. Intact visible sacrum and SI joints.  Paraspinal and other soft tissues: Mild Calcified aortic atherosclerosis. Normal caliber abdominal aorta. Negative visible other noncontrast abdominal viscera. Postoperative changes to the lumbar paraspinal soft tissues with no adverse features.  Disc levels: T11-T12: Disc space loss with mild disc bulging and endplate spurring. No stenosis.  T12-L1:   Negative.  L1-L2: Disc space loss with circumferential disc bulge and endplate spurring. No significant spinal stenosis, but mild to moderate right lateral recess stenosis suspected at the right L2 nerve level. Mild right L1 foraminal stenosis.  Subtle retrolisthesis. L2-L3: Disc space loss with circumferential disc bulge and endplate spurring. Mild facet hypertrophy. Mild ligament flavum hypertrophy. Up to mild spinal  stenosis now. Right lateral recess stenosis greater on the right. Mild to moderate right L2 foraminal stenosis.  L3-L4: Prior decompression and fusion. Bilateral pedicle screws appear intact without lucency to suggest loosening. Posterior element arthrodesis appears solid on the left. But no convincing interbody ankylosis. Residual endplate spurring. No stenosis.  L4-L5: Sequelae of decompression and fusion. Pedicle screws appear intact without lucency to suggest loosening. No interbody arthrodesis, and only scant posterior element arthrodesis bilaterally. Residual endplate spurring with up to mild L4 foraminal stenosis.  L5-S1: Disc bulging. Moderate facet hypertrophy greater on the left with bilateral vacuum facet. No spinal stenosis. Disc likely contacts the S1 nerve roots in both lateral recesses. No foraminal stenosis.  IMPRESSION: 1. Sequelae of decompression and fusion at L3-L4 and L4-L5. There is NO evidence of hardware loosening. However, there is scant arthrodesis at L4-L5, and at L3-L4 arthrodesis is limited to the left posterior elements. 2. Adjacent segment disease at L2-L3 including mild new retrolisthesis and progressed disc degeneration since 2019. Up to mild multifactorial spinal and right lateral recess stenosis. Mild to moderate right L2 foraminal stenosis. 3. Adjacent segment disease at L5-S1 with moderate facet arthropathy, vacuum facet. Mild if any lateral recess stenosis at that level. 4. L1-L2 mild to moderate right lateral recess  stenosis and mild right foraminal stenosis related to disc bulging.   Electronically Signed By: Genevie Ann M.D. On: 07/13/2021 05:20  Lumbar DG (Complete) 4+V: Results for orders placed in visit on 09/17/15 DG Lumbar Spine Complete  Narrative CLINICAL DATA:  Right hip and low back pain, history of rheumatoid arthritis  EXAM: LUMBAR SPINE - COMPLETE 4+ VIEW  COMPARISON:  None.  FINDINGS: The lumbar vertebrae are normal alignment. Mild degenerative disc disease is noted at L2-3 with there is some loss of disc space and mild spurring. No compression deformity is seen. Mild degenerative change of the facet joints of L5-S1 is noted. The SI joints are corticated. The bowel gas pattern is nonspecific.  IMPRESSION: 1. Mild degenerative disc disease at L2-3. 2. Mild degenerative change of the facet joints of L5-S1.   Electronically Signed By: Ivar Drape M.D. On: 09/17/2015 17:05  Lumbar DG Bending views: Results for orders placed during the hospital encounter of 06/10/21 DG Lumbar Spine Complete W/Bend  Narrative CLINICAL DATA:  Back pain.  EXAM: LUMBAR SPINE - COMPLETE WITH BENDING VIEWS  COMPARISON:  Radiographs 10/23/2018  FINDINGS: Thoracolumbar scoliosis again demonstrated. Postoperative changes with decompressive laminectomies and posterior fusion hardware at L3-4 and L4-5. No interbody fusion changes.  There is lucency around the pedicle screws at all 3 levels suspicious for loosening. CT examination of the lumbar spine may be helpful for further evaluation. Stable moderate disc space narrowing at L3-4. Normal alignment on the lateral film.  No acute bony findings or destructive bony changes.  The visualized bony pelvis is intact.  IMPRESSION: 1. Postoperative changes at L3-4 and L4-5. 2. Stable moderate disc space narrowing at L3-4. 3. Lucency surrounding the pedicle screws suspicious for loosening. Recommend CT lumbar spine for further evaluation. 4. No  acute bony findings.   Electronically Signed By: Marijo Sanes M.D. On: 06/11/2021 09:36        Lumbar DG Myelogram Lumbosacral: Results for orders placed during the hospital encounter of 04/02/16 DG MYELOGRAPHY LUMBAR INJ LUMBOSACRAL  Narrative CLINICAL DATA:  Low back pain.  Previous lumbar decompression.  EXAM: LUMBAR MYELOGRAM  FLUOROSCOPY TIME:  32 seconds corresponding to a Dose Area Product of 269.61 low are Gy*m2  PROCEDURE: After thorough discussion of risks and benefits of the procedure including bleeding, infection, injury to nerves, blood vessels, adjacent structures as well as headache and CSF leak, written and oral informed consent was obtained. Consent was obtained by Dr. Rolla Flatten. Time out form was completed.  Patient was positioned prone on the fluoroscopy table. Local anesthesia was provided with 1% lidocaine without epinephrine after prepped and draped in the usual sterile fashion. Puncture was performed at L3-L4 using a 3 1/2 inch 22-gauge spinal needle via midline approach. Using a single pass through the dura, the needle was placed within the thecal sac, with return of clear CSF. 15 mL of Isovue-M 200 was injected into the thecal sac, with normal opacification of the nerve roots and cauda equina consistent with free flow within the subarachnoid space.  I personally performed the lumbar puncture and administered the intrathecal contrast. I also personally supervised acquisition of the myelogram images.  TECHNIQUE: Contiguous axial images were obtained through the Lumbar spine after the intrathecal infusion of infusion. Coronal and sagittal reconstructions were obtained of the axial image sets.  COMPARISON:  Preoperative MR 09/29/2015. Postoperative MR 01/07/2016.  FINDINGS: LUMBAR MYELOGRAM FINDINGS:  Good opacification lumbar subarachnoid space. No disc protrusion or spinal stenosis. Disc space narrowing at L3-4 is accompanied by shallow  ventral defects at that level as well as sella defects at L2-3 and L4-5. Anatomic alignment is present with patient prone for myelography.  When the patient stands, stenosis develops at L2-3, due to a combination of increasingly prominent ventral disc protrusion and slight degenerative scoliosis convex LEFT. Based on the AP standing view, BILATERAL L3 nerve root impingement is observed at the L2-3 level. There is no anterolisthesis or retrolisthesis on standing flexion extension views, but the stenosis does become worse at L2-3 with patient standing in extension. In addition, with patient standing on the AP view, LEFT subarticular zone narrowing at L4-5 effaces the L5 nerve root.  CT LUMBAR MYELOGRAM FINDINGS:  Segmentation: Normal.  Alignment: Normal. 9 degree scoliosis convex LEFT centered at L3-4 due to asymmetric loss of interspace height at that level.  Vertebrae: No worrisome osseous lesion.  Conus medullaris: Normal in size location.  Paraspinal tissues: No evidence for hydronephrosis or paravertebral mass.  Disc levels:  L1-L2:  Shallow protrusion.  No impingement.  L2-L3: Central protrusion. Mild facet arthropathy. Mild stenosis. BILATERAL L3 nerve root impingement is possible.  L3-L4: Postsurgical change on the RIGHT. Shallow protrusion. No impingement.  L4-L5: Central and leftward protrusion. Mild facet arthropathy. LEFT L5 nerve root impingement is likely.  L5-S1:  Mild bulge.  Mild facet arthropathy.  No impingement.  IMPRESSION: LUMBAR MYELOGRAM IMPRESSION:  Status post L3-4 discectomy. No dynamic instability, but stenosis develops at L2-3 with patient standing, related to increasingly prominent central protrusion.  CT LUMBAR MYELOGRAM IMPRESSION:  No postsurgical complications. Slight asymmetric loss of interspace height at L3-4 on the RIGHT contributes to mild scoliosis convex LEFT.  Central protrusion at L2-3 resultant only mild stenosis with  patient recumbent for CT. Correlate clinically for symptomatic BILATERAL L3 nerve root impingement.  Shallow central and leftward protrusion at L4-5 is noted on CT. LEFT L5 nerve root impingement is only noted myelographically with patient standing.   Electronically Signed By: Staci Righter M.D. On: 04/02/2016 11:15  Hip Imaging: Hip-R DG 2-3 views: Results for orders placed during the hospital encounter of 06/10/21 DG HIP UNILAT W OR W/O PELVIS 2-3 VIEWS RIGHT  Narrative CLINICAL DATA:  Right hip pain.  EXAM: DG HIP (WITH OR WITHOUT PELVIS) 2-3V RIGHT  COMPARISON:  None.  FINDINGS: There is no evidence of hip fracture or dislocation. Mild hip degenerative change. Calcified phleboliths in the anatomic pelvis.  IMPRESSION: 1. No evidence of acute fracture or joint malalignment. 2. Mild degenerative change.   Electronically Signed By: Margaretha Sheffield MD On: 06/11/2021 09:31  Hip-L DG 2-3 views: Results for orders placed during the hospital encounter of 06/10/21 DG HIP UNILAT W OR W/O PELVIS 2-3 VIEWS LEFT  Narrative CLINICAL DATA:  Left hip pain.  EXAM: DG HIP (WITH OR WITHOUT PELVIS) 2-3V LEFT  COMPARISON:  None.  FINDINGS: The left hip is normally located. No acute bony findings or degenerative changes. No plain film evidence of avascular necrosis. The pubic symphysis and left SI joint are intact.  IMPRESSION: Normal left hip radiographs.   Electronically Signed By: Marijo Sanes M.D. On: 06/11/2021 09:27  Complexity Note: Imaging results reviewed. Results shared with Ms. Wawrzyniak, using Layman's terms.                        Meds   Current Outpatient Medications:    amLODipine (NORVASC) 10 MG tablet, Take one tab daily for blood pressure., Disp: 90 tablet, Rfl: 1   Blood Glucose Monitoring Suppl (ONETOUCH VERIO FLEX SYSTEM) w/Device KIT, Use as instructed to test blood sugar 3 times daily, Disp: 1 kit, Rfl: 0   buPROPion (WELLBUTRIN XL) 150 MG 24  hr tablet, TAKE 1 TABLET BY MOUTH ONCE DAILY FOR ANXIETY AND DEPRESSION., Disp: 90 tablet, Rfl: 1   [START ON 09/07/2021] Cholecalciferol (VITAMIN D3) 125 MCG (5000 UT) CAPS, Take 1 capsule (5,000 Units total) by mouth daily with breakfast. Take along with calcium and magnesium., Disp: 30 capsule, Rfl: 2   clopidogrel (PLAVIX) 75 MG tablet, TAKE 1 TABLET BY MOUTH EVERY DAY, Disp: 90 tablet, Rfl: 3   Continuous Blood Gluc Receiver (FREESTYLE LIBRE 14 DAY READER) DEVI, 1 Device by Does not apply route in the morning, at noon, and at bedtime., Disp: 1 each, Rfl: 0   Continuous Blood Gluc Sensor (FREESTYLE LIBRE 14 DAY SENSOR) MISC, USE AS DIRECTED CHANGE SENSOR EVERY 14 DAYS *NOT COVERRED BY INS*, Disp: 6 each, Rfl: 3   cyclobenzaprine (FLEXERIL) 10 MG tablet, TAKE 1 TABLET BY MOUTH AT BEDTIME AS NEEDED FOR MUSCLE SPASMS, Disp: 90 tablet, Rfl: 0   ergocalciferol (VITAMIN D2) 1.25 MG (50000 UT) capsule, Take 1 capsule (50,000 Units total) by mouth 2 (two) times a week. X 6 weeks., Disp: 12 capsule, Rfl: 0   folic acid (FOLVITE) 1 MG tablet, Take 2 tablets by mouth daily., Disp: 180 tablet, Rfl: 1   glucose blood test strip, Use as instructed to blood sugar 3 times daily, Disp: 300 each, Rfl: 2   hydroxychloroquine (PLAQUENIL) 200 MG tablet, Take 1 tablet (200 mg total) by mouth 2 (two) times daily., Disp: 180 tablet, Rfl: 0   insulin glargine (LANTUS) 100 UNIT/ML Solostar Pen, Inject 45-50 Units into the skin at bedtime., Disp: 15 mL, Rfl: 3   Insulin Pen Needle (PEN NEEDLES) 31G X 6 MM MISC, Use with insulin as directed., Disp: 100 each, Rfl: 0   methotrexate (RHEUMATREX) 2.5 MG tablet, Take 8 tablets (20 mg total) by mouth once a week. Caution:Chemotherapy. Protect from light., Disp: 32 tablet, Rfl: 2   rosuvastatin (CRESTOR) 20 MG tablet, Take 1 tablet (20 mg total) by mouth daily. For cholesterol, Disp: 90 tablet, Rfl:  3   Semaglutide,0.25 or 0.5MG/DOS, (OZEMPIC, 0.25 OR 0.5 MG/DOSE,) 2 MG/1.5ML SOPN,  Inject 0.5 mg into the skin once a week. For diabetes., Disp: 4.5 mL, Rfl: 0   spironolactone (ALDACTONE) 25 MG tablet, Take 25 mg by mouth daily., Disp: , Rfl:    venlafaxine XR (EFFEXOR-XR) 37.5 MG 24 hr capsule, TAKE 1 CAPSULE (37.5 MG TOTAL) BY MOUTH DAILY WITH BREAKFAST. FOR ANXIETY AND DEPRESSION., Disp: 90 capsule, Rfl: 1   metoprolol succinate (TOPROL-XL) 50 MG 24 hr tablet, Take 1 tablet (50 mg total) by mouth daily. For blood pressure, Disp: 90 tablet, Rfl: 3   telmisartan-hydrochlorothiazide (MICARDIS HCT) 80-25 MG tablet, Take by mouth., Disp: , Rfl:   ROS  Constitutional: Denies any fever or chills Gastrointestinal: No reported hemesis, hematochezia, vomiting, or acute GI distress Musculoskeletal: Denies any acute onset joint swelling, redness, loss of ROM, or weakness Neurological: No reported episodes of acute onset apraxia, aphasia, dysarthria, agnosia, amnesia, paralysis, loss of coordination, or loss of consciousness  Allergies  Lauren Winters is allergic to pseudoephedrine hcl, ancef [cefazolin], clindamycin/lincomycin, prednisone, triprolidine hcl, adhesive [tape], other, and sudafed [pseudoephedrine hcl].  PFSH  Drug: Lauren Winters  reports no history of drug use. Alcohol:  reports no history of alcohol use. Tobacco:  reports that she quit smoking about 6 years ago. Her smoking use included cigarettes. She has a 5.00 pack-year smoking history. She has never used smokeless tobacco. Medical:  has a past medical history of Anxiety, Atypical chest pain, Chronic kidney disease, Complication of anesthesia, Depression, Diabetes mellitus without complication (HCC) (metformin), Dyspnea, Fibromyalgia, Headache, Heart murmur, Hyperlipidemia, Hypertension, Lupus (systemic lupus erythematosus) ([place]), Mitral regurgitation, Obesity (BMI 30-39.9), Raynaud's phenomenon, Rheumatoid arthritis ([place]), S/P lumbar laminectomy (12/09/2017), S/P lumbar spinal fusion (05/26/2018), Sjogren's disease ([place]),  Spondylolisthesis of lumbar region, Stroke [facility]), Tobacco abuse, and Wound infection after surgery (01/11/2018). Surgical: Lauren Winters  has a past surgical history that includes Abdominal hysterectomy; Cesarean section; Lumbar laminectomy; Tubal ligation; Lumbar laminectomy/decompression microdiscectomy (N/A, 03/29/2017); Spinal cord stimulator insertion (12/09/2017); Back surgery; Spinal cord stimulator removal (N/A, 01/11/2018); and Bone marrow biopsy (10/2019). Family: family history includes Breast cancer in her paternal aunt, paternal aunt, paternal aunt, paternal aunt, and paternal aunt; CAD in her maternal grandfather, paternal aunt, paternal aunt, paternal aunt, paternal aunt, paternal aunt, and paternal uncle; CAD (age of onset: 49) in her father; Heart Problems in her brother; Hypertension in her father; Liver cancer (age of onset: 25) in her father; Stroke in her maternal grandfather and maternal grandmother; Valvular heart disease in her father.  Constitutional Exam  General appearance: Well nourished, well developed, and well hydrated. In no apparent acute distress Vitals:   07/27/21 0839  BP: (!) 151/68  Pulse: 71  Resp: 16  Temp: (!) 96.4 F (35.8 C)  TempSrc: Temporal  SpO2: 100%  Weight: 211 lb (95.7 kg)  Height: 5' 6"  (1.676 m)   BMI Assessment: Estimated body mass index is 34.06 kg/m as calculated from the following:   Height as of this encounter: 5' 6"  (1.676 m).   Weight as of this encounter: 211 lb (95.7 kg).  BMI interpretation table: BMI level Category Range association with higher incidence of chronic pain  <18 kg/m2 Underweight   18.5-24.9 kg/m2 Ideal body weight   25-29.9 kg/m2 Overweight Increased incidence by 20%  30-34.9 kg/m2 Obese (Class I) Increased incidence by 68%  35-39.9 kg/m2 Severe obesity (Class II) Increased incidence by 136%  >40 kg/m2 Extreme obesity (Class III) Increased incidence by  254%   Patient's current BMI Ideal Body weight  Body mass  index is 34.06 kg/m. Ideal body weight: 59.3 kg (130 lb 11.7 oz) Adjusted ideal body weight: 73.9 kg (162 lb 13.4 oz)   BMI Readings from Last 4 Encounters:  07/27/21 34.06 kg/m  06/10/21 34.06 kg/m  06/03/21 34.06 kg/m  03/19/21 34.96 kg/m   Wt Readings from Last 4 Encounters:  07/27/21 211 lb (95.7 kg)  06/10/21 211 lb (95.7 kg)  06/03/21 211 lb (95.7 kg)  03/19/21 216 lb 9.6 oz (98.2 kg)    Psych/Mental status: Alert, oriented x 3 (person, place, & time)       Eyes: PERLA Respiratory: No evidence of acute respiratory distress  Assessment & Plan  Primary Diagnosis & Pertinent Problem List: The primary encounter diagnosis was Chronic pain syndrome. 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  Visit Diagnosis: 1. Chronic pain syndrome   2. Chronic low back pain (1ry area of Pain) (Bilateral) (R>L) w/o sciatica   3. Chronic hip pain (2ry area of Pain) (Bilateral)   4. Chronic lower extremity pain (3ry area of Pain) (Right)   5. Diabetic peripheral neuropathy (4th area of Pain) ([place])   6. Chronic mid back pain (5th area of Pain) (Bilateral)   7. Chronic neck pain (6th area of Pain)  (posterior) (Midline) (R>L)   8. Chronic upper extremity pain (7th area of Pain) (Right)   9. 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 Sjogren's syndrome, with unspecified organ involvement ([place])   11. Rheumatoid arthritis involving multiple sites with positive rheumatoid factor ([place])   12. Elevated sed rate   13. Elevated rheumatoid factor   14. Chronic anticoagulation (Plavix)   15. Encounter for pain management counseling   16. Failed back surgical syndrome   17. Vitamin D deficiency    Problems updated and reviewed during this visit: No problems updated.   Plan of Care  Pharmacotherapy (Medications Ordered): Meds ordered this encounter  Medications   ergocalciferol (VITAMIN D2) 1.25 MG (50000 UT) capsule    Sig: Take 1 capsule (50,000 Units total) by mouth 2 (two) times a week. X 6 weeks.    Dispense:  12 capsule    Refill:  0    Fill one day early if pharmacy is closed on scheduled refill date. May substitute for generic, or similar, if available.   Cholecalciferol (VITAMIN D3) 125 MCG (5000 UT) CAPS    Sig: Take 1 capsule (5,000 Units total) by mouth daily with breakfast. Take along with calcium and magnesium.    Dispense:  30 capsule    Refill:  2    Fill 1 day early if pharmacy is closed on scheduled refill date. Generic permitted. Do not send renewal requests.    Procedure Orders    No procedure(s)  ordered today   Lab Orders  No laboratory test(s) ordered today   Imaging Orders  No imaging studies ordered today   Referral Orders  No referral(s) requested today   Pharmacological management options:  Opioid Analgesics:  Currently I do not have space available within my practice to take another patient for medication management only.  However, my evaluation has shown the patient to be an appropriate candidate for the judicious use of opioid analgesics.  I would currently recommend a trial of tramadol 50 mg, 1 tab p.o. 3 times daily to 4  times daily .  Membrane stabilizer: Options discussed, including a trial. Muscle relaxant: We have discussed the possibility of a trial NSAID: Medically contraindicated due to anticoagulation. Other analgesic(s): To be determined at a later time     Interventional Therapies  Risk  Complexity Considerations:   Estimated body mass index is 34.06 kg/m as calculated from the following:   Height as of this encounter: 5' 6"  (1.676 m).   Weight as of this encounter: 211 lb (95.7 kg). NOTE: The patient indicates being allergic/intolerant to steroids PLAVIX + PLAQUENIL Anticoagulation (Stop: 7-10 days  Restart: 2 hours)   Planned  Pending:   Pending further evaluation   Under consideration:   None at this time   Completed:   None at this time   Therapeutic  Palliative (PRN) options:   None established    Provider-requested follow-up: Return if symptoms worsen or fail to improve. Recent Visits Date Type Provider Dept  06/10/21 Office Visit Milinda Pointer, MD [facility]  Showing recent visits within past 90 days and meeting all other requirements Today's Visits Date Type Provider Dept  07/27/21 Office Visit Milinda Pointer, MD [facility]  Showing today's visits and meeting all other requirements Future Appointments No visits were found meeting these conditions. Showing future appointments within next 90 days and meeting all other requirements Primary Care Physician: Pleas Koch, NP Note by: Gaspar Cola, MD Date: 07/27/2021; Time: 9:31 AM

## 2021-07-27 ENCOUNTER — Encounter: Payer: Self-pay | Admitting: Pain Medicine

## 2021-07-27 ENCOUNTER — Other Ambulatory Visit: Payer: Self-pay

## 2021-07-27 ENCOUNTER — Ambulatory Visit: Payer: Medicare Other | Attending: Pain Medicine | Admitting: Pain Medicine

## 2021-07-27 VITALS — BP 151/68 | HR 71 | Temp 96.4°F | Resp 16 | Ht 66.0 in | Wt 211.0 lb

## 2021-07-27 DIAGNOSIS — R0781 Pleurodynia: Secondary | ICD-10-CM | POA: Diagnosis not present

## 2021-07-27 DIAGNOSIS — E559 Vitamin D deficiency, unspecified: Secondary | ICD-10-CM

## 2021-07-27 DIAGNOSIS — M961 Postlaminectomy syndrome, not elsewhere classified: Secondary | ICD-10-CM

## 2021-07-27 DIAGNOSIS — Z7901 Long term (current) use of anticoagulants: Secondary | ICD-10-CM | POA: Diagnosis not present

## 2021-07-27 DIAGNOSIS — G894 Chronic pain syndrome: Secondary | ICD-10-CM | POA: Diagnosis not present

## 2021-07-27 DIAGNOSIS — M542 Cervicalgia: Secondary | ICD-10-CM | POA: Diagnosis not present

## 2021-07-27 DIAGNOSIS — E1142 Type 2 diabetes mellitus with diabetic polyneuropathy: Secondary | ICD-10-CM

## 2021-07-27 DIAGNOSIS — M25552 Pain in left hip: Secondary | ICD-10-CM

## 2021-07-27 DIAGNOSIS — R7689 Other specified abnormal immunological findings in serum: Secondary | ICD-10-CM

## 2021-07-27 DIAGNOSIS — G8929 Other chronic pain: Secondary | ICD-10-CM

## 2021-07-27 DIAGNOSIS — M0579 Rheumatoid arthritis with rheumatoid factor of multiple sites without organ or systems involvement: Secondary | ICD-10-CM | POA: Diagnosis not present

## 2021-07-27 DIAGNOSIS — R768 Other specified abnormal immunological findings in serum: Secondary | ICD-10-CM

## 2021-07-27 DIAGNOSIS — R700 Elevated erythrocyte sedimentation rate: Secondary | ICD-10-CM

## 2021-07-27 DIAGNOSIS — M79604 Pain in right leg: Secondary | ICD-10-CM | POA: Diagnosis not present

## 2021-07-27 DIAGNOSIS — M549 Dorsalgia, unspecified: Secondary | ICD-10-CM

## 2021-07-27 DIAGNOSIS — M3500 Sicca syndrome, unspecified: Secondary | ICD-10-CM | POA: Diagnosis not present

## 2021-07-27 DIAGNOSIS — M545 Low back pain, unspecified: Secondary | ICD-10-CM

## 2021-07-27 DIAGNOSIS — M25551 Pain in right hip: Secondary | ICD-10-CM | POA: Diagnosis not present

## 2021-07-27 DIAGNOSIS — M79601 Pain in right arm: Secondary | ICD-10-CM | POA: Diagnosis not present

## 2021-07-27 DIAGNOSIS — R0789 Other chest pain: Secondary | ICD-10-CM

## 2021-07-27 DIAGNOSIS — Z7189 Other specified counseling: Secondary | ICD-10-CM | POA: Diagnosis not present

## 2021-07-27 MED ORDER — ERGOCALCIFEROL 1.25 MG (50000 UT) PO CAPS: 50000.0000 [IU] | ORAL_CAPSULE | ORAL | 0 refills | Status: AC

## 2021-07-27 MED ORDER — VITAMIN D3 125 MCG (5000 UT) PO CAPS: 1.0000 | ORAL_CAPSULE | Freq: Every day | ORAL | 2 refills | Status: AC

## 2021-07-27 NOTE — Patient Instructions (Signed)
Antiinflammatory Diet Information: [email or web address].com/diet-and-inflammation/  Tramadol Tablets What is this medication? TRAMADOL (TRA ma dole) treats severe pain. It is prescribed when other pain medications have not worked or cannot be tolerated. It works by blocking painsignals in the brain. It belongs to a group of medications called opioids. This medicine may be used for other purposes; ask your health care provider orpharmacist if you have questions. COMMON BRAND NAME(S): Ultram What should I tell my care team before I take this medication? They need to know if you have any of these conditions: Brain tumor Drug abuse or addiction Head injury If you often drink alcohol Kidney disease Liver disease Lung disease, asthma, or breathing problems Seizures Stomach or intestine problems Suicidal thoughts, plans or attempt; a previous suicide attempt by you or a family member Taken an MAOI like Marplan, Nardil, or Parnate in the last 14 days An unusual or allergic reaction to tramadol, other medications, foods, dyes, or preservatives Pregnant or trying to get pregnant Breast-feeding How should I use this medication? Take this medication by mouth with a full glass of water. Follow the directions on the prescription label. You can take it with or without food. If it upsets your stomach, take it with food. Do not take your medication more often thandirected. A special MedGuide will be given to you by the pharmacist with eachprescription and refill. Be sure to read this information carefully each time. Talk to your care team regarding the use of this medication in children.Special care may be needed. Overdosage: If you think you have taken too much of this medicine contact apoison control center or emergency room at once. NOTE: This medicine is only for you. Do not share this medicine with others. What if I miss a dose? If you miss a dose, take it as soon as you can. If it is almost  time for yournext dose, take only that dose. Do not take double or extra doses. What may interact with this medication? Do not take this medication with any of the following: Linezolid MAOIs like Marplan, Nardil, and Parnate Methylene blue Ozanimod This medication may also interact with the following: Alcohol Antihistamines for allergy, cough, and cold Atropine Certain antibiotics like erythromycin, clarithromycin, rifampin Certain antivirals for HIV or hepatitis Certain medications for anxiety or sleep Certain medications for bladder problems like oxybutynin, tolterodine Certain medications for depression like amitriptyline, bupropion, fluoxetine, paroxetine, sertraline Certain medications for fungal infections like ketoconazole, itraconazole, or posaconazole Certain medications for migraine headache like almotriptan, eletriptan, frovatriptan, naratriptan, rizatriptan, sumatriptan, zolmitriptan Certain medications for Parkinson's disease like benztropine, trihexyphenidyl Certain medications for seizures like carbamazepine, phenobarbital, primidone Certain medications for stomach problems like dicyclomine, hyoscyamine Certain medications for travel sickness like scopolamine Digoxin Diuretics General anesthetics like halothane, isoflurane, methoxyflurane, propofol Ipratropium Medications that relax muscles for surgery Other narcotic medications for pain Phenothiazines like chlorpromazine, mesoridazine, prochlorperazine, thioridazine Quinidine Warfarin This list may not describe all possible interactions. Give your health care provider a list of all the medicines, herbs, non-prescription drugs, or dietary supplements you use. Also tell them if you smoke, drink alcohol, or use illegaldrugs. Some items may interact with your medicine. What should I watch for while using this medication? Tell your care team if your pain does not go away, if it gets worse, or if you have new or a different  type of pain. You may develop tolerance to this medication. Tolerance means that you will need a higher dose of the medication for pain relief. Tolerance is normal  and is expected if you take thismedication for a long time. Do not suddenly stop taking your medication because you may develop a severe reaction. Your body becomes used to the medication. This does NOT mean you are addicted. Addiction is a behavior related to getting and using a medication for a nonmedical reason. If you have pain, you have a medical reason to take pain medication. Your care team will tell you how much medication to take. If your care team wants you to stop the medication, the dose will be slowly loweredover time to avoid any side effects. If you take other medications that also cause drowsiness like other narcotic pain medications, benzodiazepines, or other medications for sleep, you may have more side effects. Give your care team a list of all medications you use. He or she will tell you how much medication to take. Do not take more medication than directed. Call emergency services if you have trouble breathing or areunusually tired or sleepy. Talk to your care team about naloxone and how to get it. Naloxone is an emergency medication used for an opioid overdose. An overdose can happen if you take too much opioid. It can also happen if an opioid is taken with some other medications or substances, like alcohol. Know the symptoms of an overdose, like trouble breathing, unusually tired or sleepy, or not being able to respond or wake up. Make sure to tell caregivers and close contacts where it is stored. Make sure they know how to use it. After naloxone is given, you must call emergency services. Naloxone is a temporary treatment. Repeat doses may beneeded. This medication may cause serious skin reactions. They can happen weeks to months after starting the medication. Contact your care team right away if you notice fevers or flu-like  symptoms with a rash. The rash may be red or purple and then turn into blisters or peeling of the skin. Or, you might notice a red rash with swelling of the face, lips, or lymph nodes in your neck or under yourarms. You may get drowsy or dizzy. Do not drive, use machinery, or do anything that needs mental alertness until you know how this medication affects you. Do not stand up or sit up quickly, especially if you are an older patient. This reduces the risk of dizzy or fainting spells. Alcohol may interfere with theeffect of this medication. Avoid alcoholic drinks. This medication will cause constipation. If you do not have a bowel movementfor 3 days, call your care team. Your mouth may get dry. Chewing sugarless gum or sucking hard candy and drinking plenty of water may help. Contact your care team if the problem doesnot go away or is severe. What side effects may I notice from receiving this medication? Side effects that you should report to your care team as soon as possible: Allergic reactions-skin rash, itching, hives, swelling of the face, lips, tongue, or throat CNS depression-slow or shallow breathing, shortness of breath, feeling faint, dizziness, confusion, difficulty staying awake Low adrenal gland function-nausea, vomiting, loss of appetite, unusual weakness or fatigue, dizziness Low blood pressure-dizziness, feeling faint or lightheaded, blurry vision Low blood sugar (hypoglycemia)-tremors or shaking, anxiety, sweating, cold or clammy skin, confusion, dizziness, rapid heartbeat Low sodium level-muscle weakness, fatigue, dizziness, headache, confusion Redness, blistering, peeling, or loosening of the skin, including inside the mouth Seizures Side effects that usually do not require medical attention (report to your careteam if they continue or are bothersome): Constipation Dizziness Drowsiness Dry mouth Headache Nausea Vomiting  This list may not describe all possible side effects.  Call your doctor for medical advice about side effects. You may report side effects to FDA at1-800-FDA-1088. Where should I keep my medication? Keep out of the reach of children and pets. This medication can be abused. Keep it in a safe place to protect it from theft. Do not share it with anyone. It is only for you. Selling or giving away this medication is dangerous and againstthe law. Store at room temperature between 20 and 25 degrees C (68 and 77 degrees F).Get rid of any unused medication after the expiration date. This medication may cause harm and death if it is taken by other adults, children, or pets. It is important to get rid of the medication as soon as youno longer need it, or it is expired. You can do this in two ways: Take the medication to a medication take-back program. Check with your pharmacy or law enforcement to find a location. If you cannot return the medication, check the label or package insert to see if the medication should be thrown out in the garbage or flushed down the toilet. If you are not sure, ask your care team. If it is safe to put it in the trash, take the medication out of the container. Mix the medication with cat litter, dirt, coffee grounds, or other unwanted substance. Seal the mixture in a bag or container. Put it in the trash. NOTE: This sheet is a summary. It may not cover all possible information. If you have questions about this medicine, talk to your doctor, pharmacist, orhealth care provider.  2022 Elsevier/Gold Standard (2021-02-24 15:13:15)

## 2021-07-27 NOTE — Progress Notes (Signed)
Safety precautions to be maintained throughout the outpatient stay will include: orient to surroundings, keep bed in low position, maintain call bell within reach at all times, provide assistance with transfer out of bed and ambulation.  

## 2021-08-04 ENCOUNTER — Telehealth: Payer: Self-pay | Admitting: Pharmacist

## 2021-08-04 NOTE — Progress Notes (Signed)
[facility])  [place] Team    08/04/2021  Lauren Winters [birth year] [id number]  Outreach call to patient today to discuss diabetes medications and assess for affordability barriers and medication assistance. Patient is prescribed rosuvastatin 20 mg PO daily. Per review of prescription claims and a call [facility], patient has not filled a statin medication in 2022.   Left a HIPAA compliant VM for patient to return my call.

## 2021-08-05 NOTE — Progress Notes (Signed)
Lauren Winters)  [place] Team    08/05/2021  Lauren Winters Oct 21, 1966 [id number]    Outreach:  Successful telephone call with Lauren Winters.  HIPAA identifiers verified.    Objective: The ASCVD Risk score Lauren Bussing DC Jr., et al., 2013) failed to calculate for the following reasons:   The patient has a prior MI or stroke diagnosis  Lab Results  Component Value Date   CREATININE 0.82 06/10/2021   CREATININE 0.74 02/17/2021   CREATININE 0.83 01/14/2021    Lab Results  Component Value Date   HGBA1C 10.4 (A) 06/03/2021    Lipid Panel     Component Value Date/Time   CHOL 182 02/17/2021 0941   TRIG 219.0 (H) 02/17/2021 0941   HDL 43.60 02/17/2021 0941   CHOLHDL 4 02/17/2021 0941   VLDL 43.8 (H) 02/17/2021 0941   LDLCALC 91 03/03/2019 0510   LDLDIRECT 103.0 02/17/2021 0941    BP Readings from Last 3 Encounters:  07/27/21 (!) 151/68  06/10/21 (!) 185/83  06/03/21 (!) 158/96    Allergies  Allergen Reactions   Pseudoephedrine Hcl Shortness Of Breath and Other (See Comments)    Room starts spinning, can't breath   Ancef [Cefazolin] Other (See Comments)    Unknown   Clindamycin/Lincomycin Hives   Prednisone Other (See Comments)    Chest pain w/ cortisone shots or prednisone pills Elevated blood pressure   Triprolidine Hcl    Adhesive [Tape] Rash and Other (See Comments)    Sensitivity to certain band aids and adhesives - burns skin, makes skin raw   Other Rash and Other (See Comments)    Sensitivity to certain band aids and adhesives - burns skin, makes skin raw   Sudafed [Pseudoephedrine Hcl] Other (See Comments)    Dizziness     Assessment: Received a return a call from patient. Discussed Ozempic with patient. She reports that she did pick up the Ozempic and used it at a dose of 0.5 mg weekly. She reported nausea from the medication. We reviewed mechanism of the medication and discussed eating smaller less fatty meals. Lauren Winters  reports that she has not had a dose of the Ozempic in 1.5 weeks due to not refilling yet due to cost. She reports that she was using the 0.5 mg dose. I will recommend that medication be restarted at Ozempic 0.025 mg subQ weekly for 4 weeks to decrease nausea.  Lauren Winters reported that her first fill of the Ozempic cost her >$100. I reviewed her insurance plan with her and noted that the co-pay should have been $47. [facility] - confirmed that the cost was $47.   Lauren Winters is currently using Lantus. She is receiving this at the $35 co-pay. I did discuss patient assistance with the Lantus and noted that this would be with a different company then the [place]. I will recommend a therapeutic change to [place] of appropriate per provider.   Discussed rosuvastatin 20 mg 1 tab PO daily with patient. She reports that she is tolerating the dose increase and has been taking two of the Rosuvastatin 10 mg tablets.     Medication Assistance Findings:  Medication assistance needs identified: Ozempic and Lantus   Extra Help:  Not eligible for Extra Help Low Income Subsidy based on reported income and assets  Patient Assistance Programs: Ozempic made by [employer] requirement met: Yes Out-of-pocket prescription expenditure met:   Not Applicable Patient has met application requirements to  apply for this program.    Tyler Aas  made by [employer] requirement met: Yes Out-of-pocket prescription expenditure met:   Not Applicable Patient has met application requirements to apply for this program.    Lantus made by [employer] requirement met: Yes Out-of-pocket prescription expenditure met:   Not Applicable Patient has met application requirements to apply for this program.     Additional medication assistance options reviewed with patient as warranted:  No other options identified  Plan: I will route patient assistance letter to [place] technician who will  coordinate patient assistance program application process for medications listed above.  [facility] pharmacy technician will assist with obtaining all required documents from both patient and provider(s) and submit application(s) once completed.   Loretha Brasil, PharmD [place] Clinical Pharmacist Direct Dial: [phone]

## 2021-08-06 NOTE — Progress Notes (Signed)
Office Visit Note  Patient: Lauren Winters             Date of Birth: 1966-09-04           MRN: [id number]             PCP: Pleas Koch, NP Referring: Pleas Koch, NP Visit Date: 08/19/2021 Occupation: @GUAROCC @  Subjective:  Dry mouth   History of Present Illness: Lauren Winters is a 55 y.o. female with history of systemic lupus erythematosus, sjogren's syndrome, and osteoarthritis.  She is taking plaquenil 200 mg 1 tablet by mouth twice daily, methotrexate 8 tablets by mouth once weekly, and folic acid 2 mg daily.  She denies missing any doses recently.  She has not had any recent infections.  She states she has been experiencing increased pain in both hands over the past several months.  She denies any joint swelling.  She continues to have chronic lower back pain.  She denies any recent falls.  She continues to use a cane to assist with ambulation.  She states that she tried to establish care with pain management but does not want to take opiates so she will no longer be following up. She has noticed increased mouth dryness and more nasal ulcers.  She continues to use biotene mouth spray daily and has been trying to increase her fluid intake.  She has not seen the dentist recently.  She previously was taking pilocarpine at bedtime which was effective at managing her symptoms.  She discontinued pilocarpine since her symptoms have improved but she would like to restart it if a new prescription can be sent to the pharmacy.  She continues to have chronic dry eyes but her symptoms have been stable.  She has an upcoming appointment with her ophthalmologist in October 2022.  She denies any recent rashes.  She has ongoing hair thinning and photosensitivity.  She also has symptoms of Raynaud's intermittently but denies any digital ulcerations.  She has noticed intermittent swollen lymph nodes and states that she had several ultrasounds which were unremarkable.  She states that the lymph  nodes are typically swollen for about 1 week and then resolved.  She has not had any fevers recently.  She continues to have chronic fatigue on a daily basis.     Activities of Daily Living:  Patient reports morning stiffness for 4-5 hours.   Patient Reports nocturnal pain.  Difficulty dressing/grooming: Reports Difficulty climbing stairs: Reports Difficulty getting out of chair: Reports Difficulty using hands for taps, buttons, cutlery, and/or writing: Reports  Review of Systems  Constitutional:  Positive for fatigue.  HENT:  Positive for mouth dryness and nose dryness. Negative for mouth sores.   Eyes:  Positive for dryness. Negative for pain and itching.  Respiratory:  Positive for shortness of breath. Negative for difficulty breathing.   Cardiovascular:  Negative for chest pain and palpitations.  Gastrointestinal:  Negative for blood in stool, constipation and diarrhea.  Endocrine: Positive for increased urination.  Genitourinary:  Positive for difficulty urinating.  Musculoskeletal:  Positive for joint pain, joint pain, myalgias, morning stiffness, muscle tenderness and myalgias. Negative for joint swelling.  Skin:  Negative for color change, rash and redness.  Allergic/Immunologic: Negative for susceptible to infections.  Neurological:  Positive for numbness, headaches, memory loss and weakness. Negative for dizziness.  Hematological:  Positive for bruising/bleeding tendency.  Psychiatric/Behavioral:  Negative for confusion.    PMFS History:  Patient Active Problem List   Diagnosis  Date Noted   Encounter for pain management counseling 07/25/2021   History of infection of neurostimulator battery w/ removal of device 06/12/2021    Class: History of   Elevated C-reactive protein (CRP) 06/12/2021   Elevated sed rate 06/12/2021   DDD (degenerative disc disease), thoracic 06/12/2021   DDD (degenerative disc disease), cervical 06/12/2021   Osteoarthritis of hip (Right)  06/12/2021   Other idiopathic scoliosis, thoracolumbar region 06/12/2021   Fixation hardware in spine (L3-4 and L4-5) 06/12/2021   Chronic pain syndrome 06/10/2021   Pharmacologic therapy 06/10/2021   Disorder of skeletal system 06/10/2021   Problems influencing health status 06/10/2021   Elevated hemoglobin A1c 06/10/2021   Vitamin D deficiency 06/10/2021   Positive ANA (antinuclear antibody) 06/10/2021   Elevated rheumatoid factor 06/10/2021   Elevated d-dimer 06/10/2021   Chronic anticoagulation (Plavix) 06/10/2021   Chronic lower extremity pain (3ry area of Pain) (Right) 06/10/2021   Diabetic peripheral neuropathy (4th area of Pain) ([place]) 06/10/2021   Chronic, intermittent, rib pain (8th area of Pain) (Bilateral) 06/10/2021   Thoracic radiculitis (T6) (intermittent) (Bilateral) 06/10/2021   Chronic neck pain (6th area of Pain) (posterior) (Midline) (R>L) 06/10/2021   Cervicalgia 06/10/2021   Chronic upper extremity pain (7th area of Pain) (Right) 06/10/2021   Cough 01/14/2021   Adenopathy, cervical 08/18/2020   Monoclonal gammopathy 05/14/2020   Bilateral primary osteoarthritis of hip 10/19/2019   Paresthesias 03/02/2019   Chronic hand pain (Bilateral) 12/06/2018   Headache disorder 12/06/2018   Left arm weakness 10/11/2018   Numbness and tingling in left arm 10/11/2018   Essential hypertension 10/06/2018   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[place]) 10/06/2018   Stage 3 chronic kidney disease ([place]) 10/06/2018   History of stroke 08/19/2018   History of lumbar spinal fusion (L3-4, L4-5) 05/26/2018   Local infection of wound 02/07/2018   High risk medication use 02/03/2018   Trochanteric bursitis 06/09/2017   Lumbar post-laminectomy syndrome (L3-4, L4-5) 06/09/2017   Rheumatoid arthritis involving multiple sites with positive rheumatoid factor ([place]) 06/09/2017   HNP (herniated nucleus pulposus), lumbar 03/29/2017   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    Chronic shoulder pain (Right) 02/18/2017   Herniation of nucleus pulposus of cervical intervertebral disc without myelopathy 02/09/2017   Lumbosacral spondylosis with radiculopathy 01/21/2017   Primary osteoarthritis of both hands 12/22/2016   Primary osteoarthritis of both knees 12/22/2016   Spondylosis of lumbar region without myelopathy or radiculopathy 12/22/2016   Fibromyalgia 12/22/2016   Preventative health care 04/19/2016   Chronic hip pain (2ry area of Pain) (Bilateral) 03/09/2016   Disorder of sacroiliac joint 02/23/2016   Chronic low back pain (1ry area of Pain) (Bilateral) (R>L) w/o sciatica 02/04/2016   Chronic mid back pain (5th area of Pain) (Bilateral) 12/17/2015   Displacement of lumbar intervertebral disc without myelopathy 10/29/2015   Hyperlipidemia 08/29/2015   Lupus ([place]) 08/04/2015   Rheumatoid factor positive 08/04/2015   Sjogren's syndrome ([place]) 08/04/2015   Mitral regurgitation    Atypical chest pain    Obesity (BMI 30-39.9)    Anxiety    Depression 05/23/2014   Chest pain 05/23/2014    Past Medical History:  Diagnosis Date   Anxiety    Atypical chest pain    a. ETT 05/2014: normal stress test without evidence of myocardial ischemia or chest pain at peak stress   Chronic kidney disease    Complication of anesthesia    migraine headache   Depression    Diabetes mellitus without complication (  [place]) metformin   dx 2015   Dyspnea    on exertion   Fibromyalgia    Headache    Heart murmur    told back in 2007, but no mention with 2016 cardiac w/u   Hyperlipidemia    Hypertension    Lupus (systemic lupus erythematosus) ([place])    Mitral regurgitation    a. 05/2014 echo: EF >55%, nl LV/RV sys fxn, mild MR/AR, no valvular stenosis    Obesity (BMI 30-39.9)    Raynaud's phenomenon    Rheumatoid arthritis ([place])    S/P lumbar laminectomy 12/09/2017   S/P lumbar spinal fusion 05/26/2018   Sjogren's disease ([place])    Spondylolisthesis of lumbar region     Stroke [facility])    Tobacco abuse    a. ongoing; b. ongoing SOB   Wound infection after surgery 01/11/2018    Family History  Problem Relation Age of Onset   CAD Father 35       CABG x 4   Liver cancer Father 63       passed   Valvular heart disease Father    Hypertension Father    CAD Paternal 36        CABG   Breast cancer Paternal Aunt    CAD Paternal 103        CABG   Breast cancer Paternal Aunt    CAD Paternal 38        CABG   Breast cancer Paternal Aunt    CAD Paternal 43        CABG   Breast cancer Paternal Aunt    CAD Paternal Aunt        CABG   Breast cancer Paternal Aunt    CAD Paternal Uncle        MI s/p CABG   Stroke Maternal Grandmother    Stroke Maternal Grandfather    CAD Maternal Grandfather    Heart Problems Brother    Past Surgical History:  Procedure Laterality Date   ABDOMINAL HYSTERECTOMY     BACK SURGERY     BONE MARROW BIOPSY  10/2019   CESAREAN SECTION     x3   LUMBAR LAMINECTOMY     LUMBAR LAMINECTOMY/DECOMPRESSION MICRODISCECTOMY N/A 03/29/2017   Procedure: Re operative Right Lumbar Three-Four Laminectomy and discectomy with Left Lumbar Three-Four, Bilateral Lumbar Four-Five Laminectomy for decompression;  Surgeon: Kevan Ny Ditty, MD;  Location: [place];  Service: Neurosurgery;  Laterality: N/A;   SPINAL CORD STIMULATOR INSERTION  12/09/2017   Procedure: LUMBAR SPINAL CORD STIMULATOR IMPLANT;  Surgeon: Eustace Moore, MD;  Location: [place];  Service: Neurosurgery;;   SPINAL CORD STIMULATOR REMOVAL N/A 01/11/2018   Procedure: LUMBAR SPINAL CORD STIMULATOR REMOVAL;  Surgeon: Eustace Moore, MD;  Location: [place];  Service: Neurosurgery;  Laterality: N/A;   TUBAL LIGATION     Social History   Social History Narrative   Married.   3 children.   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    Immunization History  Administered Date(s) Administered   Influenza,inj,Quad PF,6+ Mos 10/19/2016, 10/21/2017,  01/31/2019, 10/19/2019   PFIZER(Purple Top)SARS-COV-2 Vaccination 04/17/2020, 05/08/2020   Pneumococcal Polysaccharide-23 10/27/2014, 06/25/2019   Td 04/19/2016   Zoster, Live 10/27/2014     Objective: Vital Signs: BP 117/78 (BP Location: Left Arm, Patient Position: Sitting, Cuff Size: Large)   Pulse (!) 105   Resp 12   Ht 5' 6"  (1.676 m)   Wt 203 lb (92.1  kg)   BMI 32.77 kg/m    Physical Exam Vitals and nursing note reviewed.  Constitutional:      Appearance: She is well-developed.  HENT:     Head: Normocephalic and atraumatic.  Eyes:     Conjunctiva/sclera: Conjunctivae normal.  Cardiovascular:     Rate and Rhythm: Normal rate and regular rhythm.     Pulses: Normal pulses.     Heart sounds: Murmur heard.  Pulmonary:     Effort: Pulmonary effort is normal.     Breath sounds: Normal breath sounds.  Abdominal:     Palpations: Abdomen is soft.  Musculoskeletal:     Cervical back: Normal range of motion.  Skin:    General: Skin is warm and dry.     Capillary Refill: Capillary refill takes less than 2 seconds.  Neurological:     Mental Status: She is alert and oriented to person, place, and time.  Psychiatric:        Behavior: Behavior normal.     Musculoskeletal Exam: C-spine has limited range of motion.  Thoracic and lumbar spine range of motion difficult to assess well in seated position.  Shoulder joint abduction to about 90 degrees bilaterally.  Elbow joints have good range of motion with no tenderness or inflammation.  Wrist joints have good range of motion with no tenderness or inflammation.  Tenderness over the right first and second MCP joints but no synovitis noted.  Complete fist formation bilaterally.  Hip joints difficult to assess in seated position.  Knee joints have good range of motion with no warmth or effusion.  Ankle joints have good range of motion with no tenderness or joint swelling.  No tenderness over MTP joints.  CDAI Exam: CDAI Score: -- Patient  Global: --; Provider Global: -- Swollen: --; Tender: -- Joint Exam 08/19/2021   No joint exam has been documented for this visit   There is currently no information documented on the homunculus. Go to the Rheumatology activity and complete the homunculus joint exam.  Investigation: No additional findings.  Imaging: No results found.  Recent Labs: Lab Results  Component Value Date   WBC 7.7 06/18/2021   HGB 14.2 06/18/2021   PLT 393 06/18/2021   NA 135 06/10/2021   K 4.0 06/10/2021   CL 98 06/10/2021   CO2 26 06/10/2021   GLUCOSE 416 (H) 06/10/2021   BUN 18 06/10/2021   CREATININE 0.82 06/10/2021   BILITOT 0.9 06/10/2021   ALKPHOS 102 06/10/2021   AST 19 06/10/2021   ALT 13 06/10/2021   PROT 7.9 06/18/2021   ALBUMIN 4.4 06/10/2021   CALCIUM 9.7 06/10/2021   GFRAA 103 10/20/2020    Speciality Comments: PLQ Eye Exam: 10/24/2020 WNL @ [place] Follow up in 1 year  Procedures:  No procedures performed Allergies: Pseudoephedrine hcl, Ancef [cefazolin], Clindamycin/lincomycin, Prednisone, Triprolidine hcl, Adhesive [tape], Other, and Sudafed [pseudoephedrine hcl]   Assessment / Plan:     Visit Diagnoses: Other organ or system involvement in systemic lupus erythematosus ([place]) - +La, oral ulcers, malar rash, fatigue, photosensitivity, Raynaud's, Subcutaneous lupus-bx+, hair loss, +RF, sicca symptoms: She has not had any signs or symptoms of a systemic lupus flare since her last office visit.  She has clinically been doing well taking Plaquenil 200 mg 1 tablet by mouth twice daily, methotrexate 8 tablets by mouth once weekly, and folic acid 2 mg by mouth daily.  She had no synovitis on examination today.  She has not had any recent  rashes or worsening hair loss.  She continues to have intermittent symptoms of Raynaud's but no digital ulcerations were noted on examination today.  She has noticed increased mouth dryness over the past several months so a new prescription  for pilocarpine 5 mg twice daily as needed for symptomatic relief was sent to the pharmacy.  Use of over-the-counter products as well as the importance of biannual dental exams was discussed as well.  She recently has experienced increased nasal ulcerations but does not have any currently.  She has not noticed any other new or worsening of symptoms.  Lab work from 06/18/21 was reviewed today in the office: ESR 31, complements WNL, and dsDNA is negative.  We will recheck the following lab work today.  She was advised to notify us if she develops signs or symptoms of a flare.  She will remain on the current treatment regimen.  She will follow-up in the office in 5 months.- Plan: Protein / creatinine ratio, urine, CBC with Differential/Platelet, COMPLETE METABOLIC PANEL WITH GFR, Anti-DNA antibody, double-stranded, C3 and C4, Sedimentation rate  High risk medication use -Plaquenil 200 mg 1 tablet by mouth twice daily, methotrexate 8 tablets by mouth once weekly, folic acid 64m po qd. CBC was drawn on 06/18/2021.  CMP was updated on 06/10/2021.  She is due to update lab work today.  Orders for CBC and CMP were released.  Her next lab work will be due in November and every 3 months to monitor for drug toxicity.- Plan: CBC with Differential/Platelet, COMPLETE METABOLIC PANEL WITH GFR Discussed the importance of holding methotrexate if she develops signs or symptoms of an infection and to resume once the infection is completely cleared.  Sjogren's syndrome with other organ involvement ([place]+, [place]+: She has ongoing sicca symptoms but has noticed increased mouth dryness over the past several months.  She has been using Biotene oral rinse on a daily basis as well as trying to increase her fluid intake with minimal improvement in her symptoms.  She was previously taking pilocarpine at bedtime which was effective at managing her symptoms.  A new prescription for pilocarpine 5 mg twice daily as needed was sent to the  pharmacy today.  Discussed the importance of good oral health and she was strongly encouraged to schedule appointment with her dentist.  She continues to have chronic eye dryness but her symptoms have been stable.  She has an upcoming appointment with her ophthalmologist in October 2022.  She was advised to notify [place]if she develops any new or worsening symptoms.  Plan: Protein / creatinine ratio, urine, CBC with Differential/Platelet, COMPLETE METABOLIC PANEL WITH GFR  Raynaud's syndrome without gangrene: She experiences intermittent symptoms of Raynaud's.  She has no digital ulcerations or signs of gangrene on examination today.  Good capillary refill noted.  Primary osteoarthritis of both hands: She has mild DIP and PIP thickening consistent with osteoarthritis of both hands.  She has been experiencing increased pain and stiffness in both hands over the past several months.  She has no synovitis on examination today.  She has some tenderness over the right first and second MCP joints.  We discussed the importance of joint protection and muscle strengthening.  She was given a handout of hand exercises to perform.  She is not a good candidate for NSAIDs.  She declined updated x-rays of her hands at this time.  Primary osteoarthritis of both knees - She had SynviscOne injections in bilateral knee joints on 03/03/2020.  She  continues to have chronic pain in both knee joints.  She has good range of motion of both knees with no warmth or effusion on exam today.  She continues to use a cane to assist with ambulation.  Trochanteric bursitis of both hips: Chronic pain.  S/P lumbar laminectomy - She continues to follow-up with a spine specialist.  She tried to establish care with a pain management doctor to discuss other options instead of surgical intervention but she does not want to take opiates.  Fibromyalgia: She continues to have generalized myalgias and muscle tenderness due to fibromyalgia.  She has  persistent fatigue secondary to insomnia.  Essential hypertension: Blood pressure was 117/78 today in the office.  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  Diabetes mellitus without complication ([place])  Mitral valve insufficiency, unspecified etiology  Vitamin D deficiency  Osteoporosis screening - May 04, 2017 DEXA scan was normal done by her PCP.  Former smoker  Orders: Orders Placed This Encounter  Procedures   Protein / creatinine ratio, urine   CBC with Differential/Platelet   COMPLETE METABOLIC PANEL WITH GFR   Anti-DNA antibody, double-stranded   C3 and C4   Sedimentation rate   Meds ordered this encounter  Medications   pilocarpine (SALAGEN) 5 MG tablet    Sig: Take 1 tablet (5 mg total) by mouth 2 (two) times daily.    Dispense:  180 tablet    Refill:  0   folic acid (FOLVITE) 1 MG tablet    Sig: Take 2 tablets by mouth daily.    Dispense:  180 tablet    Refill:  3      Follow-Up Instructions: Return in about 5 months (around 01/19/2022) for Systemic lupus erythematosus, Sjogren's syndrome, Osteoarthritis.   Ofilia Neas, PA-C  Note - This record has been created using Dragon software.  Chart creation errors have been sought, but may not always  have been located. Such creation errors do not reflect on  the standard of medical care.

## 2021-08-08 ENCOUNTER — Other Ambulatory Visit: Payer: Self-pay | Admitting: Physician Assistant

## 2021-08-08 ENCOUNTER — Other Ambulatory Visit: Payer: Self-pay | Admitting: Primary Care

## 2021-08-08 DIAGNOSIS — F32A Depression, unspecified: Secondary | ICD-10-CM

## 2021-08-08 DIAGNOSIS — F419 Anxiety disorder, unspecified: Secondary | ICD-10-CM

## 2021-08-10 NOTE — Telephone Encounter (Signed)
Called patient l/m to call office should have been out a few months ago. Need to see if she is taking at all has follow up in September.

## 2021-08-10 NOTE — Telephone Encounter (Signed)
Next Visit: 08/19/2021   Last Visit: 03/19/2021   Last Fill: 04/13/2021  DX: Other organ or system involvement in systemic lupus erythematosus   Current Dose per office note 03/19/2021: PLQ 200 mg 1 tablet by mouth twice daily  Labs:06/18/2021 CBC is normal, CMP 06/10/2021 Glucose 416 Total Protein 9.0  10/24/2020 WNL   Okay to refill PLQ?

## 2021-08-11 NOTE — Telephone Encounter (Signed)
Will refill, but may need to discuss adherence with Anda Kraft in more detail

## 2021-08-11 NOTE — Telephone Encounter (Signed)
Pt called back, she stated that she takes this everyday and hasn't missed any

## 2021-08-11 NOTE — Telephone Encounter (Signed)
Called patient states she is taking every day. Has not missed any doses and is not getting script from any other office. I have called pharmacy and verified the only refills she has received of this were both for 90 days on 09/03/2020 and 03/11/2021. Ok to refill until follow up with Anda Kraft?

## 2021-08-15 ENCOUNTER — Other Ambulatory Visit: Payer: Self-pay | Admitting: Primary Care

## 2021-08-15 DIAGNOSIS — E785 Hyperlipidemia, unspecified: Secondary | ICD-10-CM

## 2021-08-17 ENCOUNTER — Telehealth: Payer: Self-pay | Admitting: Pharmacist

## 2021-08-17 NOTE — Progress Notes (Signed)
Placed call to patient to update her regarding prescriptions and medication assistance. Left HIPAA compliant voice mail to return my call.

## 2021-08-18 ENCOUNTER — Telehealth: Payer: Self-pay | Admitting: Pharmacy Technician

## 2021-08-18 DIAGNOSIS — Z596 Low income: Secondary | ICD-10-CM

## 2021-08-18 NOTE — Progress Notes (Signed)
[facility])                                            [place] Team                                                                         Medication Assistance Referral  Referral From: [place]  Medication/Company: [employer] / [employer] Patient application portion:  Education officer, museum portion: Faxed  to Alma Friendly, NP Provider address/fax verified via: Office website  Medication/Company: Tyler Aas / [employer] Patient application portion:  Education officer, museum portion: Faxed  to Alma Friendly, NP Provider address/fax verified via: [employer]. Arkie Tagliaferro, [place]  [phone]

## 2021-08-19 ENCOUNTER — Encounter: Payer: Self-pay | Admitting: Physician Assistant

## 2021-08-19 ENCOUNTER — Ambulatory Visit (INDEPENDENT_AMBULATORY_CARE_PROVIDER_SITE_OTHER): Payer: Medicare Other | Admitting: Physician Assistant

## 2021-08-19 ENCOUNTER — Other Ambulatory Visit: Payer: Self-pay

## 2021-08-19 VITALS — BP 117/78 | HR 105 | Resp 12 | Ht 66.0 in | Wt 203.0 lb

## 2021-08-19 DIAGNOSIS — Z9889 Other specified postprocedural states: Secondary | ICD-10-CM | POA: Diagnosis not present

## 2021-08-19 DIAGNOSIS — M797 Fibromyalgia: Secondary | ICD-10-CM | POA: Diagnosis not present

## 2021-08-19 DIAGNOSIS — M19041 Primary osteoarthritis, right hand: Secondary | ICD-10-CM | POA: Diagnosis not present

## 2021-08-19 DIAGNOSIS — E119 Type 2 diabetes mellitus without complications: Secondary | ICD-10-CM | POA: Diagnosis not present

## 2021-08-19 DIAGNOSIS — M3219 Other organ or system involvement in systemic lupus erythematosus: Secondary | ICD-10-CM

## 2021-08-19 DIAGNOSIS — M170 Bilateral primary osteoarthritis of knee: Secondary | ICD-10-CM

## 2021-08-19 DIAGNOSIS — M7061 Trochanteric bursitis, right hip: Secondary | ICD-10-CM | POA: Diagnosis not present

## 2021-08-19 DIAGNOSIS — M19042 Primary osteoarthritis, left hand: Secondary | ICD-10-CM

## 2021-08-19 DIAGNOSIS — Z8673 Personal history of transient ischemic attack (TIA), and cerebral infarction without residual deficits: Secondary | ICD-10-CM | POA: Diagnosis not present

## 2021-08-19 DIAGNOSIS — M3509 Sicca syndrome with other organ involvement: Secondary | ICD-10-CM

## 2021-08-19 DIAGNOSIS — I10 Essential (primary) hypertension: Secondary | ICD-10-CM

## 2021-08-19 DIAGNOSIS — I7300 Raynaud's syndrome without gangrene: Secondary | ICD-10-CM

## 2021-08-19 DIAGNOSIS — E559 Vitamin D deficiency, unspecified: Secondary | ICD-10-CM

## 2021-08-19 DIAGNOSIS — Z13820 Encounter for screening for osteoporosis: Secondary | ICD-10-CM

## 2021-08-19 DIAGNOSIS — I340 Nonrheumatic mitral (valve) insufficiency: Secondary | ICD-10-CM

## 2021-08-19 DIAGNOSIS — Z79899 Other long term (current) drug therapy: Secondary | ICD-10-CM

## 2021-08-19 DIAGNOSIS — Z87891 Personal history of nicotine dependence: Secondary | ICD-10-CM

## 2021-08-19 DIAGNOSIS — M7062 Trochanteric bursitis, left hip: Secondary | ICD-10-CM

## 2021-08-19 MED ORDER — PILOCARPINE HCL 5 MG PO TABS: 5.0000 mg | ORAL_TABLET | Freq: Two times a day (BID) | ORAL | 0 refills | Status: DC

## 2021-08-19 MED ORDER — FOLIC ACID 1 MG PO TABS: ORAL_TABLET | ORAL | 3 refills | Status: DC

## 2021-08-19 NOTE — Patient Instructions (Addendum)
Standing Labs We placed an order today for your standing lab work.   Please have your standing labs drawn in November and every 3 months   If possible, please have your labs drawn 2 weeks prior to your appointment so that the provider can discuss your results at your appointment.  Please note that you may see your imaging and lab results in [place] before we have reviewed them. We may be awaiting multiple results to interpret others before contacting you. Please allow our office up to 72 hours to thoroughly review all of the results before contacting the office for clarification of your results.  We have open lab daily: Monday through Thursday from 1:30-4:30 PM and Friday from 1:30-4:00 PM at the office of Dr. Bo Merino, [facility].   Please be advised, all patients with office appointments requiring lab work will take precedent over walk-in lab work.  If possible, please come for your lab work on Monday and Friday afternoons, as you may experience shorter wait times. The office is located at [address] [place], [place], [place] [address] No appointment is necessary.   Labs are drawn by [facility]. Please bring your co-pay at the time of your lab draw.  You may receive a bill from [facility] for your lab work.  If you wish to have your labs drawn at another location, please call the office 24 hours in advance to send orders.  If you have any questions regarding directions or hours of operation,  please call [phone].   As a reminder, please drink plenty of water prior to coming for your lab work. Thanks!  Hand Exercises Hand exercises can be helpful for almost anyone. These exercises can strengthen the hands, improve flexibility and movement, and increase blood flow to the hands. These results can make work and daily tasks easier. Hand exercises can be especially helpful for people who have joint pain from arthritis or have nerve damage from overuse (carpal tunnel  syndrome). These exercises can also help people who have injured a hand. Exercises Most of these hand exercises are gentle stretching and motion exercises. It is usually safe to do them often throughout the day. Warming up your hands before exercise may help to reduce stiffness. You can do this with gentle massage orby placing your hands in warm water for 10-15 minutes. It is normal to feel some stretching, pulling, tightness, or mild discomfort as you begin new exercises. This will gradually improve. Stop an exercise right away if you feel sudden, severe pain or your pain gets worse. Ask your healthcare provider which exercises are best for you. Knuckle bend or "claw" fist Stand or sit with your arm, hand, and all five fingers pointed straight up. Make sure to keep your wrist straight during the exercise. Gently bend your fingers down toward your palm until the tips of your fingers are touching the top of your palm. Keep your big knuckle straight and just bend the small knuckles in your fingers. Hold this position for __________ seconds. Straighten (extend) your fingers back to the starting position. Repeat this exercise 5-10 times with each hand. Full finger fist Stand or sit with your arm, hand, and all five fingers pointed straight up. Make sure to keep your wrist straight during the exercise. Gently bend your fingers into your palm until the tips of your fingers are touching the middle of your palm. Hold this position for __________ seconds. Extend your fingers back to the starting position, stretching every joint  fully. Repeat this exercise 5-10 times with each hand. Straight fist Stand or sit with your arm, hand, and all five fingers pointed straight up. Make sure to keep your wrist straight during the exercise. Gently bend your fingers at the big knuckle, where your fingers meet your hand, and the middle knuckle. Keep the knuckle at the tips of your fingers straight and try to touch the  bottom of your palm. Hold this position for __________ seconds. Extend your fingers back to the starting position, stretching every joint fully. Repeat this exercise 5-10 times with each hand. Tabletop Stand or sit with your arm, hand, and all five fingers pointed straight up. Make sure to keep your wrist straight during the exercise. Gently bend your fingers at the big knuckle, where your fingers meet your hand, as far down as you can while keeping the small knuckles in your fingers straight. Think of forming a tabletop with your fingers. Hold this position for __________ seconds. Extend your fingers back to the starting position, stretching every joint fully. Repeat this exercise 5-10 times with each hand. Finger spread Place your hand flat on a table with your palm facing down. Make sure your wrist stays straight as you do this exercise. Spread your fingers and thumb apart from each other as far as you can until you feel a gentle stretch. Hold this position for __________ seconds. Bring your fingers and thumb tight together again. Hold this position for __________ seconds. Repeat this exercise 5-10 times with each hand. Making circles Stand or sit with your arm, hand, and all five fingers pointed straight up. Make sure to keep your wrist straight during the exercise. Make a circle by touching the tip of your thumb to the tip of your index finger. Hold for __________ seconds. Then open your hand wide. Repeat this motion with your thumb and each finger on your hand. Repeat this exercise 5-10 times with each hand. Thumb motion Sit with your forearm resting on a table and your wrist straight. Your thumb should be facing up toward the ceiling. Keep your fingers relaxed as you move your thumb. Lift your thumb up as high as you can toward the ceiling. Hold for __________ seconds. Bend your thumb across your palm as far as you can, reaching the tip of your thumb for the small finger (pinkie) side  of your palm. Hold for __________ seconds. Repeat this exercise 5-10 times with each hand. Grip strengthening  Hold a stress ball or other soft ball in the middle of your hand. Slowly increase the pressure, squeezing the ball as much as you can without causing pain. Think of bringing the tips of your fingers into the middle of your palm. All of your finger joints should bend when doing this exercise. Hold your squeeze for __________ seconds, then relax. Repeat this exercise 5-10 times with each hand. Contact a health care provider if: Your hand pain or discomfort gets much worse when you do an exercise. Your hand pain or discomfort does not improve within 2 hours after you exercise. If you have any of these problems, stop doing these exercises right away. Do not do them again unless your health care provider says that you can. Get help right away if: You develop sudden, severe hand pain or swelling. If this happens, stop doing these exercises right away. Do not do them again unless your health care provider says that you can. This information is not intended to replace advice given to you by  your health care provider. Make sure you discuss any questions you have with your healthcare provider. Document Revised: 04/05/2019 Document Reviewed: 12/14/2018 Elsevier Patient Education  [place].

## 2021-08-20 LAB — CBC WITH DIFFERENTIAL/PLATELET
Absolute Monocytes: 465 cells/uL (ref 200–950)
Basophils Absolute: 93 cells/uL (ref 0–200)
Basophils Relative: 1 %
Eosinophils Absolute: 149 cells/uL (ref 15–500)
Eosinophils Relative: 1.6 %
HCT: 45.6 % — ABNORMAL HIGH (ref 35.0–45.0)
Hemoglobin: 15.1 g/dL (ref 11.7–15.5)
Lymphs Abs: 2771 cells/uL (ref 850–3900)
MCH: 27.7 pg (ref 27.0–33.0)
MCHC: 33.1 g/dL (ref 32.0–36.0)
MCV: 83.5 fL (ref 80.0–100.0)
MPV: 9.1 fL (ref 7.5–12.5)
Monocytes Relative: 5 %
Neutro Abs: 5822 cells/uL (ref 1500–7800)
Neutrophils Relative %: 62.6 %
Platelets: 458 10*3/uL — ABNORMAL HIGH (ref 140–400)
RBC: 5.46 10*6/uL — ABNORMAL HIGH (ref 3.80–5.10)
RDW: 13.3 % (ref 11.0–15.0)
Total Lymphocyte: 29.8 %
WBC: 9.3 10*3/uL (ref 3.8–10.8)

## 2021-08-20 LAB — COMPLETE METABOLIC PANEL WITH GFR
AG Ratio: 1.2 (calc) (ref 1.0–2.5)
ALT: 9 U/L (ref 6–29)
AST: 14 U/L (ref 10–35)
Albumin: 4.5 g/dL (ref 3.6–5.1)
Alkaline phosphatase (APISO): 123 U/L (ref 37–153)
BUN/Creatinine Ratio: 11 (calc) (ref 6–22)
BUN: 13 mg/dL (ref 7–25)
CO2: 28 mmol/L (ref 20–32)
Calcium: 10.1 mg/dL (ref 8.6–10.4)
Chloride: 95 mmol/L — ABNORMAL LOW (ref 98–110)
Creat: 1.14 mg/dL — ABNORMAL HIGH (ref 0.50–1.03)
Globulin: 3.8 g/dL (calc) — ABNORMAL HIGH (ref 1.9–3.7)
Glucose, Bld: 214 mg/dL — ABNORMAL HIGH (ref 65–99)
Potassium: 3.9 mmol/L (ref 3.5–5.3)
Sodium: 135 mmol/L (ref 135–146)
Total Bilirubin: 0.9 mg/dL (ref 0.2–1.2)
Total Protein: 8.3 g/dL — ABNORMAL HIGH (ref 6.1–8.1)
eGFR: 56 mL/min/{1.73_m2} — ABNORMAL LOW (ref >=60)

## 2021-08-20 LAB — ANTI-DNA ANTIBODY, DOUBLE-STRANDED: ds DNA Ab: <1 IU/mL

## 2021-08-20 LAB — C3 AND C4
C3 Complement: 207 mg/dL — ABNORMAL HIGH (ref 83–193)
C4 Complement: 31 mg/dL (ref 15–57)

## 2021-08-20 LAB — SEDIMENTATION RATE: Sed Rate: 39 mm/h — ABNORMAL HIGH (ref 0–30)

## 2021-08-20 NOTE — Progress Notes (Signed)
RBC count, hgb, and hct are borderline elevated.  dsDNA negative.  ESR is borderline elevated-39. C3 is borderline elevated-207. C4 WNL.  Labs are not consistent with a flare.   Glucose is elevated-214. Total protein is borderline elevated-8.3.  Creatinine is elevated-1.14 and eGFR 56.  Please advise the patient to reduce MTX to 7 tablets once weekly. Recheck BMP with GFR in 1 month.

## 2021-08-21 ENCOUNTER — Other Ambulatory Visit: Payer: Self-pay | Admitting: *Deleted

## 2021-08-21 DIAGNOSIS — Z79899 Other long term (current) drug therapy: Secondary | ICD-10-CM

## 2021-08-21 MED ORDER — METHOTREXATE 2.5 MG PO TABS: 17.5000 mg | ORAL_TABLET | ORAL | 0 refills | Status: DC

## 2021-08-21 NOTE — Telephone Encounter (Signed)
-----  Message from Ofilia Neas, PA-C sent at 08/20/2021  4:45 PM EDT ----- RBC count, hgb, and hct are borderline elevated.  dsDNA negative.  ESR is borderline elevated-39. C3 is borderline elevated-207. C4 WNL.  Labs are not consistent with a flare.   Glucose is elevated-214. Total protein is borderline elevated-8.3.  Creatinine is elevated-1.14 and eGFR 56.  Please advise the patient to reduce MTX to 7 tablets once weekly. Recheck BMP with GFR in 1 month.

## 2021-08-22 ENCOUNTER — Other Ambulatory Visit: Payer: Self-pay | Admitting: Primary Care

## 2021-08-22 DIAGNOSIS — E119 Type 2 diabetes mellitus without complications: Secondary | ICD-10-CM

## 2021-08-22 MED ORDER — OZEMPIC (0.25 OR 0.5 MG/DOSE) 2 MG/1.5ML ~~LOC~~ SOPN: PEN_INJECTOR | SUBCUTANEOUS | 0 refills | Status: DC

## 2021-08-31 ENCOUNTER — Other Ambulatory Visit: Payer: Self-pay | Admitting: Primary Care

## 2021-08-31 DIAGNOSIS — I639 Cerebral infarction, unspecified: Secondary | ICD-10-CM

## 2021-09-02 ENCOUNTER — Telehealth: Payer: Self-pay | Admitting: Pharmacist

## 2021-09-02 NOTE — Progress Notes (Signed)
Placed call to patient to follow up regarding her Ozempic and patient assistance forms. Left HIPPA compliant VM for patient to return my call.

## 2021-09-04 ENCOUNTER — Ambulatory Visit: Payer: Medicare Other | Admitting: Primary Care

## 2021-09-04 ENCOUNTER — Telehealth: Payer: Self-pay | Admitting: Primary Care

## 2021-09-04 NOTE — Telephone Encounter (Addendum)
Lauren Winters, will you attempt to contact patient via My Chart to reschedule her appointment with [place] and to notify her that the pharmacist Tiffany is trying to get a hold of her?    ----- Message from [facility], [facility] sent at 09/04/2021  2:24 PM EDT ----- Regarding: Patient Follow Up Brien Mates,   I see that [name] cancelled her appointment with you for today. I have called her several times and left several voice mails. She has not returned my phone calls. Our pharmacy technician, Susy Frizzle has attempted to follow up with her regarding her patient assistance forms with unsuccessful attempts and unreturned calls to [place] also.   I do hope that she is ok. I just wanted to touch base with you to let you know that I will continue to outreach her periodically to attempt to re-engage her for the patient assistance and medication adherence with her diabetes medications and statin.   Please let me know if I can be if any assistance with this patient otherwise.   Loretha Brasil, PharmD [place] Clinical Pharmacist Direct Dial: [phone]

## 2021-09-07 NOTE — Telephone Encounter (Signed)
Called patient was able to reach on husbands number. I have given Pharmacist number as well as r/s her office visit. She is aware it will be at [place].

## 2021-09-08 NOTE — Telephone Encounter (Signed)
Noted  

## 2021-09-15 ENCOUNTER — Ambulatory Visit: Payer: Medicare Other | Admitting: Primary Care

## 2021-10-06 ENCOUNTER — Telehealth: Payer: Self-pay | Admitting: Pharmacy Technician

## 2021-10-06 DIAGNOSIS — Z596 Low income: Secondary | ICD-10-CM

## 2021-10-06 NOTE — Progress Notes (Signed)
[facility])                                            [place] Team    10/06/2021  Lauren Winters September 30, 1966 [id number]  Received both patient and provider portion(s) of patient assistance application(s) for Ozempic and [place]. Faxed completed application and required documents into [employer].    Lauren Winters, [place]  [phone]

## 2021-10-09 ENCOUNTER — Telehealth: Payer: Self-pay | Admitting: Pharmacy Technician

## 2021-10-09 ENCOUNTER — Telehealth: Payer: Self-pay | Admitting: Primary Care

## 2021-10-09 DIAGNOSIS — Z596 Low income: Secondary | ICD-10-CM

## 2021-10-09 NOTE — Progress Notes (Signed)
[facility])                                            [place] Team    10/09/2021  KOREE STAHELI Jul 20, 1966 [id number]  Care coordination call placed to [place] in regards to [place] patient assistance application.  Spoke to [place] who informed patient was APPROVED for both medications from  10/07/2021-12/26/2021. She informed both medications would be delivered to the provider's office in the next 10-15 business days barring any shipping delays which are still occurring currently. Therefore may take a few extra delivery days form the typicaly 10-15 business day timeline.  Carmalita [place] P. Izella Ybanez, [place]  [phone]

## 2021-10-09 NOTE — Telephone Encounter (Addendum)
Can you take a look? I know you don't read staff messages :)   ----- Message from Jason Fila, CPhT sent at 10/09/2021 11:54 AM EDT ----- Regarding: patient assistance approval Good am!  Your patient listed above has been approved for [employer] patient assistance for Ozempic and [place] and this company delivers medications to the provider's office.  With that being said, I believe your clinic in [place] is closed due to remodel.  Therefore, I was wondering if someone from the office could call [place] at [phone] and provide them an alternate provider address to deliver the medication. This was what the rep I spoke to today suggested.  Please let me know if I can assist in any way.  Thanks,  Luiz Ochoa. [place], [place]  [phone]

## 2021-10-12 NOTE — Telephone Encounter (Signed)
Called was on hold for 15 minutes had to get patient will call back

## 2021-10-15 NOTE — Telephone Encounter (Signed)
Contacted the Novo[employer]ordisk pt assistance program and spoke with Gershon Mussel who reports pt was just approved and the address to ship to was [place]. Advised of need for change of address and he reported a reorder form would need completed with new address and then the order could be sent. Advised this nurse has talked to [employer] on 10/10 and she has changed the address for the clinic. He said that he could not do that without a new signed order. Advised to have Cyndy call this nurse back. Order that was processing to ship to [place] was cancelled.   Gershon Mussel also reported this will be the only order the pt will receive this year because they send 90 day supplies. Pt will have to reapply and be approved for next year in Jan.   Tom is leaving a msg for Cyndy to call this nurse.   Unable to route this msg to [employer], pharmacy tech from [facility] that helped with paperwork for assistance due to clearances. Sent a staff msg so she is aware of new temporary address.

## 2021-10-21 NOTE — Telephone Encounter (Signed)
Msg was sent to [facility] concerning new temporary address.   Contacted Cyndy Pugh at [employer] pt assistance program and seh reports order form must have new temporary address on it and refaxed. She said it was ok to print original order form and cross out the address and put in the temporary address and make note on the fax sheet also of the new temporary address. Fax number is [phone].   Printed order form from the media tab and updated address and faxed to number above.

## 2021-10-22 DIAGNOSIS — M329 Systemic lupus erythematosus, unspecified: Secondary | ICD-10-CM | POA: Diagnosis not present

## 2021-10-22 DIAGNOSIS — N182 Chronic kidney disease, stage 2 (mild): Secondary | ICD-10-CM | POA: Diagnosis not present

## 2021-10-22 DIAGNOSIS — I10 Essential (primary) hypertension: Secondary | ICD-10-CM | POA: Diagnosis not present

## 2021-10-27 NOTE — Telephone Encounter (Signed)
Noted  

## 2021-10-28 DIAGNOSIS — Z794 Long term (current) use of insulin: Secondary | ICD-10-CM

## 2021-10-28 DIAGNOSIS — E1165 Type 2 diabetes mellitus with hyperglycemia: Secondary | ICD-10-CM

## 2021-10-29 NOTE — Telephone Encounter (Signed)
Larene Beach, any updates on this? Joellen, have you seen her medications? See phone notes from October 2022.

## 2021-10-29 NOTE — Telephone Encounter (Signed)
Called and spoke to [place] # [phone].  States that all medications including needles were "processed" on 10-30 and will be delivered to our address in 10-14 business days. States that the tresiba was sent out to [employer] address but have been returned and reprocessed. Confirmed 3 times that both medications will be in current shipment.    I have sent message to patient with this information. No further action needed at this time.

## 2021-10-30 ENCOUNTER — Telehealth: Payer: Self-pay | Admitting: Primary Care

## 2021-10-30 NOTE — Progress Notes (Signed)
  Chronic Care Management   Outreach Note  10/30/2021 Name: Lauren Winters MRN: [id number] DOB: 05-23-1966  Referred by: Pleas Koch, NP Reason for referral : No chief complaint on file.   An unsuccessful telephone outreach was attempted today. The patient was referred to the pharmacist for assistance with care management and care coordination.   Follow Up Plan:   [name] [facility] Scheduler

## 2021-11-06 ENCOUNTER — Telehealth: Payer: Self-pay | Admitting: Primary Care

## 2021-11-06 NOTE — Progress Notes (Signed)
  Chronic Care Management   Outreach Note  11/06/2021 Name: Lauren Winters MRN: [id number] DOB: 1966/07/08  Referred by: Pleas Koch, NP Reason for referral : No chief complaint on file.   A second unsuccessful telephone outreach was attempted today. The patient was referred to pharmacist for assistance with care management and care coordination.  Follow Up Plan:   [name] [facility] Scheduler

## 2021-11-06 NOTE — Progress Notes (Signed)
  Chronic Care Management   Note  11/06/2021 Name: Lauren Winters MRN: [id number] DOB: 05/03/1966  Lauren Winters is a 55 y.o. year old female who is a primary care patient of Pleas Koch, NP. I reached out to Lauren Winters by phone today in response to a referral sent by Ms. Daphane T Chamorro's PCP, Pleas Koch, NP.   Ms. Roorda was given information about Chronic Care Management services today including:  CCM service includes personalized support from designated clinical staff supervised by her physician, including individualized plan of care and coordination with other care providers 24/7 contact phone numbers for assistance for urgent and routine care needs. Service will only be billed when office clinical staff spend 20 minutes or more in a month to coordinate care. Only one practitioner may furnish and bill the service in a calendar month. The patient may stop CCM services at any time (effective at the end of the month) by phone call to the office staff.   Patient agreed to services and verbal consent obtained.   Follow up plan:   Tatjana Secretary/administrator

## 2021-11-09 DIAGNOSIS — Z794 Long term (current) use of insulin: Secondary | ICD-10-CM | POA: Diagnosis not present

## 2021-11-09 DIAGNOSIS — E1142 Type 2 diabetes mellitus with diabetic polyneuropathy: Secondary | ICD-10-CM | POA: Diagnosis not present

## 2021-11-09 DIAGNOSIS — E785 Hyperlipidemia, unspecified: Secondary | ICD-10-CM | POA: Diagnosis not present

## 2021-11-09 DIAGNOSIS — I129 Hypertensive chronic kidney disease with stage 1 through stage 4 chronic kidney disease, or unspecified chronic kidney disease: Secondary | ICD-10-CM | POA: Diagnosis not present

## 2021-11-09 DIAGNOSIS — E1122 Type 2 diabetes mellitus with diabetic chronic kidney disease: Secondary | ICD-10-CM | POA: Diagnosis not present

## 2021-11-09 DIAGNOSIS — Z800 Family history of malignant neoplasm of digestive organs: Secondary | ICD-10-CM | POA: Diagnosis not present

## 2021-11-09 DIAGNOSIS — N189 Chronic kidney disease, unspecified: Secondary | ICD-10-CM | POA: Diagnosis not present

## 2021-11-09 DIAGNOSIS — Z79899 Other long term (current) drug therapy: Secondary | ICD-10-CM | POA: Diagnosis not present

## 2021-11-09 DIAGNOSIS — D472 Monoclonal gammopathy: Secondary | ICD-10-CM | POA: Diagnosis not present

## 2021-11-09 DIAGNOSIS — M549 Dorsalgia, unspecified: Secondary | ICD-10-CM | POA: Diagnosis not present

## 2021-11-24 ENCOUNTER — Telehealth: Payer: Self-pay | Admitting: Primary Care

## 2021-11-24 MED ORDER — FREESTYLE LIBRE 3 SENSOR MISC: 1.0000 | 1 refills | Status: DC

## 2021-11-24 NOTE — Telephone Encounter (Signed)
Hi Michelle!  This patient has been out of Lantus and Ozempic for months. Apparently our office has been working to get this covered and delivered to our office but we've been unsuccessful.   Do you mind taking a look?  Allie Bossier, NP-C

## 2021-11-24 NOTE — Telephone Encounter (Signed)
Printed and placed in [employer]

## 2021-11-24 NOTE — Telephone Encounter (Signed)
Yes, okay for in office visit

## 2021-11-25 ENCOUNTER — Telehealth: Payer: Self-pay | Admitting: Pharmacy Technician

## 2021-11-25 ENCOUNTER — Telehealth: Payer: Self-pay

## 2021-11-25 DIAGNOSIS — Z596 Low income: Secondary | ICD-10-CM

## 2021-11-25 NOTE — Telephone Encounter (Signed)
Received the tresiba today. Have logged in and called patient left message to call office to let know ready for pick up and give information below.

## 2021-11-25 NOTE — Telephone Encounter (Signed)
Thanks, Larene Beach.  Joellen, can we find her Tyler Aas insulin?  Patient needs to pick up and start 15 units daily and increasing by 4 units every five days to get to a goal of fasting blood sugar less than 150.  We will see her in a few weeks as scheduled.  Have her bring her glucose logs.  Also notify her that the [place] is on backorder.

## 2021-11-25 NOTE — Telephone Encounter (Signed)
Received tresiba from patient assistance. Need to call patient and let know can pick up. No Ozempic has been delivered yet.

## 2021-11-25 NOTE — Progress Notes (Signed)
[facility] [facility])                                            [place] Team    11/25/2021  RAJAH LAMBA [birth year] [id number]  Care coordination call placed to [place] in regard to [place] delivery.  Spoke to [place] who informed Tyler Aas and pen needles were shipped 11/24/21 and are to be delivered today 11/25/21 to provider's temporary location. Lynn informed Ozempic is on backorder due to supply challenges as well as shipping delays. There is no timeline as to when this medication will be delivered.  Patient will need to re enroll for 2023 and patient's re enrollment will be handled by [facility] as she has consented to chronic care management services with [facility] as of 11/06/21.  Ashey [place]ana P. Apryl Brymer, [place]  [phone]

## 2021-11-25 NOTE — Telephone Encounter (Signed)
Lvm to call back

## 2021-11-25 NOTE — Telephone Encounter (Signed)
Added information to another open message.

## 2021-11-25 NOTE — Telephone Encounter (Addendum)
Per Loretha Brasil, PharmD [facility] - patient has changed from Lantus to [place] due to cost/ease of application for assistance. Alma Friendly approved. She asked that I update medication list.

## 2021-11-25 NOTE — Telephone Encounter (Signed)
This pt has been assisted by a different person within [place] does not know of this pt assistance paperwork. I was involved to make Lauren Winters aware of the temporary [facility] address.  Received a staff msg today from [place] reporting;  I was following up on some of my patient's [employer] delivery statuses for patient's that have been approved in the program until today and found out that they shipped [name]'s Tresiba and pen needles yesterday and per the rep Lauren Winters, she said they would be delivered today to the temp location address that you provided them as below. She said the Ozempic is on backorder and has not shipped. She said she was unsure when that would ship as they are having supply challenges with Ozempic and shipping delays on top of that. Thanks, Lauren Winters. [place], [place]  [phone]

## 2021-11-26 NOTE — Telephone Encounter (Signed)
Message was not routed to me.  Noted.

## 2021-11-26 NOTE — Telephone Encounter (Signed)
Patient informed that medications is ready to pick up at out office. Directions on how to start given. Patient informed about Ozempic.

## 2021-11-26 NOTE — Telephone Encounter (Signed)
2nd attempt  LMTCB to schedule appt

## 2021-11-26 NOTE — Telephone Encounter (Signed)
Noted and appreciate follow-up.

## 2021-11-27 ENCOUNTER — Telehealth: Payer: Self-pay

## 2021-11-27 NOTE — Chronic Care Management (AMB) (Addendum)
Chronic Care Management Pharmacy Assistant   Name: SUELLA COGAR  MRN: [id number] DOB: Nov 07, 1966  Lauren Winters is an 55 y.o. year old female who presents for her initial CCM visit with the clinical pharmacist.  Reason for Encounter: Initial Questions   Conditions to be addressed/monitored: HTN and DMII  Recent office visits:  06/03/21-PCP-Patient presented for follow up diabetes.New A1C 10.4.Start Venlafaxine ER 37.73m wean off Lexapro.Increase your Lantus to 45 units nightly for one week, then to 50 units if your sugars remain above 150. Please update me after one week.   Recent consult visits:  11/09/21-[facility]-Patient presented for follow up monoclonal gammopathy.Labs ordered (abnormal-glucose elevated, BUN low) 08/19/21-Rheumatology-Patient presented for evaluation. Stiffness in hands,fatigue,dry mouth.Previous labs reviewed,new labs ordered,start Pilocarpine 5235mtake 1 tablet 2 times daily,start folic acid 35m108mTake 2 tablets daily follow up 5 months 07/27/21-Pain Management-Patient presented for follow up back pain and pain medication management.Trial of Tramadol 55m79mtablet 3 times a day as needed.start Vitamin D3 5000 units,take 1 a day,vitamin D2 50000 units take 2 times a week for 6 weeks. 06/10/21-Pain Management-Patient presented for initial visit back pain .imaging reviewed,no medication changes  Hospital visits:  None in previous 6 months  Medications: Outpatient Encounter Medications as of 11/27/2021  Medication Sig   amLODipine (NORVASC) 10 MG tablet Take one tab daily for blood pressure.   Blood Glucose Monitoring Suppl (ONETEvermanDevice KIT Use as instructed to test blood sugar 3 times daily   buPROPion (WELLBUTRIN XL) 150 MG 24 hr tablet TAKE 1 TABLET BY MOUTH ONCE DAILY FOR ANXIETY AND DEPRESSION.   Cholecalciferol (VITAMIN D3) 125 MCG (5000 UT) CAPS Take 1 capsule (5,000 Units total) by mouth daily with breakfast. Take along with  calcium and magnesium.   clopidogrel (PLAVIX) 75 MG tablet TAKE 1 TABLET BY MOUTH EVERY DAY   Continuous Blood Gluc Receiver (FREESTYLE LIBRE 14 DAY READER) DEVI 1 Device by Does not apply route in the morning, at noon, and at bedtime.   Continuous Blood Gluc Sensor (FREESTYLE LIBRE 3 SENSOR) MISC 1 Device by Does not apply route continuous. Place 1 sensor on the skin every 14 days. Use to check glucose continuously   cyclobenzaprine (FLEXERIL) 10 MG tablet TAKE 1 TABLET BY MOUTH AT BEDTIME AS NEEDED FOR MUSCLE SPASMS   folic acid (FOLVITE) 1 MG tablet Take 2 tablets by mouth daily.   glucose blood test strip Use as instructed to blood sugar 3 times daily   hydroxychloroquine (PLAQUENIL) 200 MG tablet TAKE 1 TABLET BY MOUTH TWICE A DAY   insulin degludec (TRESIBA FLEXTOUCH) 100 UNIT/ML FlexTouch Pen Inject 45-50 Units into the skin daily.   Insulin Pen Needle (PEN NEEDLES) 31G X 6 MM MISC Use with insulin as directed.   methotrexate (RHEUMATREX) 2.5 MG tablet Take 7 tablets (17.5 mg total) by mouth once a week. Caution:Chemotherapy. Protect from light.   metoprolol succinate (TOPROL-XL) 50 MG 24 hr tablet Take 1 tablet (50 mg total) by mouth daily. For blood pressure   pilocarpine (SALAGEN) 5 MG tablet Take 1 tablet (5 mg total) by mouth 2 (two) times daily.   rosuvastatin (CRESTOR) 20 MG tablet Take 1 tablet (20 mg total) by mouth daily. For cholesterol   Semaglutide,0.25 or 0.5MG/DOS, (OZEMPIC, 0.25 OR 0.5 MG/DOSE,) 2 MG/1.5ML SOPN Inject 0.25 mg once weekly x 4 weeks, then 0.5 mg once weekly thereafter for diabetes.   spironolactone (ALDACTONE) 25 MG tablet Take 25 mg by mouth  daily.   telmisartan-hydrochlorothiazide (MICARDIS HCT) 80-25 MG tablet Take by mouth.   venlafaxine XR (EFFEXOR-XR) 37.5 MG 24 hr capsule TAKE 1 CAPSULE (37.5 MG TOTAL) BY MOUTH DAILY WITH BREAKFAST. FOR ANXIETY AND DEPRESSION. (Patient not taking: Reported on 08/19/2021)   No facility-administered encounter medications  on file as of 11/27/2021.   Lab Results  Component Value Date/Time   HGBA1C 10.4 (A) 06/03/2021 12:39 PM   HGBA1C 9.4 (H) 02/17/2021 09:41 AM   HGBA1C 7.6 (A) 08/18/2020 11:36 AM   HGBA1C 9.5 (H) 03/03/2019 05:10 AM   MICROALBUR 1.7 10/19/2016 10:36 AM   MICROALBUR 2.2 (H) 12/17/2015 02:04 PM     BP Readings from Last 3 Encounters:  08/19/21 117/78  07/27/21 (!) 151/68  06/10/21 (!) 185/83     Unsuccessful attempt to reach patient. Left patient message to have all medications, supplements and any blood glucose and blood pressure readings available for review at appointment.   Patient contacted office to reschedule appointment.   Star Rating Drugs:  Medication:  Last Fill: Day Supply Rosuvastatin 53m 02/20/21 90 Telmisartan-HCTZ 05/09/21 90   Care Gaps: Annual wellness visit in last year? No Most Recent BP reading:117/78  105-P  08/19/21  If Diabetic: Most recent A1C reading:  10.4  06/03/21 Last eye exam / retinopathy screening: up to date per chart Last diabetic foot exam:  May 2021  MDebbora Dus CPP notified  VAvel Sensor CBentonAssistant [phone] I have reviewed the care management and care coordination activities outlined in this encounter and I am certifying that I agree with the content of this note. No further action required.  MDebbora Dus PharmD Clinical Pharmacist [phone]

## 2021-12-01 ENCOUNTER — Telehealth: Payer: Medicare Other

## 2021-12-02 ENCOUNTER — Telehealth: Payer: Self-pay

## 2021-12-02 DIAGNOSIS — M19042 Primary osteoarthritis, left hand: Secondary | ICD-10-CM

## 2021-12-02 DIAGNOSIS — M3219 Other organ or system involvement in systemic lupus erythematosus: Secondary | ICD-10-CM

## 2021-12-02 DIAGNOSIS — M170 Bilateral primary osteoarthritis of knee: Secondary | ICD-10-CM

## 2021-12-02 DIAGNOSIS — G8929 Other chronic pain: Secondary | ICD-10-CM

## 2021-12-02 DIAGNOSIS — M545 Low back pain, unspecified: Secondary | ICD-10-CM

## 2021-12-02 DIAGNOSIS — M19041 Primary osteoarthritis, right hand: Secondary | ICD-10-CM

## 2021-12-02 DIAGNOSIS — M7061 Trochanteric bursitis, right hip: Secondary | ICD-10-CM

## 2021-12-02 DIAGNOSIS — M797 Fibromyalgia: Secondary | ICD-10-CM

## 2021-12-02 NOTE — Telephone Encounter (Signed)
Patient would like to get second opinion she has been declined from [facility] pain management. She in a lot of pain and it is interfering with daily activities. She is open to any location.

## 2021-12-02 NOTE — Telephone Encounter (Signed)
Error patient has app on 12/13. Spoke to patient would be closer if we can get her meds to [place] will find out if someone can give to patient and let her know when she can pick up.

## 2021-12-02 NOTE — Telephone Encounter (Signed)
Need to call patient let know needles received she as appointment tomorrow may want to pick up then.

## 2021-12-03 ENCOUNTER — Other Ambulatory Visit: Payer: Self-pay

## 2021-12-03 DIAGNOSIS — E1165 Type 2 diabetes mellitus with hyperglycemia: Secondary | ICD-10-CM

## 2021-12-03 MED ORDER — FREESTYLE LIBRE 3 SENSOR MISC: 1.0000 | 1 refills | Status: DC

## 2021-12-03 NOTE — Telephone Encounter (Signed)
Placed in your box for signature

## 2021-12-03 NOTE — Telephone Encounter (Signed)
Called patient she will be by to pick up tomorrow after lunch to get meds. Teams sent to [facility] to let her know.

## 2021-12-03 NOTE — Telephone Encounter (Signed)
Ok to place referral to Dr. Satira Mccallum.   Please also place a referral to integrative therapies.

## 2021-12-03 NOTE — Telephone Encounter (Signed)
Completed and handed to [place].

## 2021-12-03 NOTE — Telephone Encounter (Signed)
Did you send her FreeStyle Libre to the pharmacy under DME?

## 2021-12-03 NOTE — Telephone Encounter (Signed)
Left message to return call to our office.  Took meds to [place] office for pick up just need to let patient know.

## 2021-12-04 ENCOUNTER — Telehealth: Payer: Self-pay | Admitting: Pain Medicine

## 2021-12-04 NOTE — Telephone Encounter (Signed)
We received a referral on Lauren Winters, she is already a patient of Dr. Dossie Arbour. I closed referral and called patient, had to lvmail. She can schedule follow up appt. With Dr. Dossie Arbour

## 2021-12-08 ENCOUNTER — Encounter: Payer: Self-pay | Admitting: Primary Care

## 2021-12-08 ENCOUNTER — Other Ambulatory Visit: Payer: Self-pay

## 2021-12-08 ENCOUNTER — Ambulatory Visit (INDEPENDENT_AMBULATORY_CARE_PROVIDER_SITE_OTHER): Payer: Medicare Other | Admitting: Primary Care

## 2021-12-08 VITALS — BP 134/62 | HR 86 | Temp 98.6°F | Ht 66.0 in | Wt 209.0 lb

## 2021-12-08 DIAGNOSIS — Z794 Long term (current) use of insulin: Secondary | ICD-10-CM | POA: Diagnosis not present

## 2021-12-08 DIAGNOSIS — F331 Major depressive disorder, recurrent, moderate: Secondary | ICD-10-CM | POA: Diagnosis not present

## 2021-12-08 DIAGNOSIS — R053 Chronic cough: Secondary | ICD-10-CM | POA: Diagnosis not present

## 2021-12-08 DIAGNOSIS — F411 Generalized anxiety disorder: Secondary | ICD-10-CM | POA: Diagnosis not present

## 2021-12-08 DIAGNOSIS — E1165 Type 2 diabetes mellitus with hyperglycemia: Secondary | ICD-10-CM

## 2021-12-08 DIAGNOSIS — Z23 Encounter for immunization: Secondary | ICD-10-CM | POA: Diagnosis not present

## 2021-12-08 LAB — POCT GLYCOSYLATED HEMOGLOBIN (HGB A1C): Hemoglobin A1C: 10.3 % — AB (ref 4.0–5.6)

## 2021-12-08 MED ORDER — SERTRALINE HCL 50 MG PO TABS: 50.0000 mg | ORAL_TABLET | Freq: Every day | ORAL | 0 refills | Status: DC

## 2021-12-08 MED ORDER — GLUCOSE BLOOD VI STRP: ORAL_STRIP | 2 refills | Status: DC

## 2021-12-08 NOTE — Patient Instructions (Signed)
Continue Tresiba 45 units daily for diabetes.  Start checking your blood sugar levels.  Appropriate times to check your blood sugar levels are:  -Before any meal (breakfast, lunch, dinner) -Two hours after any meal (breakfast, lunch, dinner) -Bedtime  Record your readings and send them via MyChart on Friday this week.  Start sertraline (Zoloft) 50 mg for anxiety and depression. Take 1/2 tablet by mouth once daily for about one week, then increase to 1 full tablet thereafter.   We will see you in 6 weeks.  It was a pleasure to see you today!  Diabetes Mellitus and Nutrition, Adult When you have diabetes, or diabetes mellitus, it is very important to have healthy eating habits because your blood sugar (glucose) levels are greatly affected by what you eat and drink. Eating healthy foods in the right amounts, at about the same times every day, can help you: Manage your blood glucose. Lower your risk of heart disease. Improve your blood pressure. Reach or maintain a healthy weight. What can affect my meal plan? Every person with diabetes is different, and each person has different needs for a meal plan. Your health care provider may recommend that you work with a dietitian to make a meal plan that is best for you. Your meal plan may vary depending on factors such as: The calories you need. The medicines you take. Your weight. Your blood glucose, blood pressure, and cholesterol levels. Your activity level. Other health conditions you have, such as heart or kidney disease. How do carbohydrates affect me? Carbohydrates, also called carbs, affect your blood glucose level more than any other type of food. Eating carbs raises the amount of glucose in your blood. It is important to know how many carbs you can safely have in each meal. This is different for every person. Your dietitian can help you calculate how many carbs you should have at each meal and for each snack. How does alcohol affect  me? Alcohol can cause a decrease in blood glucose (hypoglycemia), especially if you use insulin or take certain diabetes medicines by mouth. Hypoglycemia can be a life-threatening condition. Symptoms of hypoglycemia, such as sleepiness, dizziness, and confusion, are similar to symptoms of having too much alcohol. Do not drink alcohol if: Your health care provider tells you not to drink. You are pregnant, may be pregnant, or are planning to become pregnant. If you drink alcohol: Limit how much you have to: 0-1 drink a day for women. 0-2 drinks a day for men. Know how much alcohol is in your drink. In the [place] one drink equals one 12 oz bottle of beer (355 mL), one 5 oz glass of wine (148 mL), or one 1 oz glass of hard liquor (44 mL). Keep yourself hydrated with water, diet soda, or unsweetened iced tea. Keep in mind that regular soda, juice, and other mixers may contain a lot of sugar and must be counted as carbs. What are tips for following this plan? Reading food labels Start by checking the serving size on the Nutrition Facts label of packaged foods and drinks. The number of calories and the amount of carbs, fats, and other nutrients listed on the label are based on one serving of the item. Many items contain more than one serving per package. Check the total grams (g) of carbs in one serving. Check the number of grams of saturated fats and trans fats in one serving. Choose foods that have a low amount or none of these fats. Check the  number of milligrams (mg) of salt (sodium) in one serving. Most people should limit total sodium intake to less than 2,300 mg per day. Always check the nutrition information of foods labeled as "low-fat" or "nonfat." These foods may be higher in added sugar or refined carbs and should be avoided. Talk to your dietitian to identify your daily goals for nutrients listed on the label. Shopping Avoid buying canned, pre-made, or processed foods. These foods tend to  be high in fat, sodium, and added sugar. Shop around the outside edge of the grocery store. This is where you will most often find fresh fruits and vegetables, bulk grains, fresh meats, and fresh dairy products. Cooking Use low-heat cooking methods, such as baking, instead of high-heat cooking methods, such as deep frying. Cook using healthy oils, such as olive, canola, or sunflower oil. Avoid cooking with butter, cream, or high-fat meats. Meal planning Eat meals and snacks regularly, preferably at the same times every day. Avoid going long periods of time without eating. Eat foods that are high in fiber, such as fresh fruits, vegetables, beans, and whole grains. Eat 4-6 oz (112-168 g) of lean protein each day, such as lean meat, chicken, fish, eggs, or tofu. One ounce (oz) (28 g) of lean protein is equal to: 1 oz (28 g) of meat, chicken, or fish. 1 egg.  cup (62 g) of tofu. Eat some foods each day that contain healthy fats, such as avocado, nuts, seeds, and fish. What foods should I eat? Fruits Berries. Apples. Oranges. Peaches. Apricots. Plums. Grapes. Mangoes. Papayas. Pomegranates. Kiwi. Cherries. Vegetables Leafy greens, including lettuce, spinach, kale, chard, collard greens, mustard greens, and cabbage. Beets. Cauliflower. Broccoli. Carrots. Green beans. Tomatoes. Peppers. Onions. Cucumbers. Brussels sprouts. Grains Whole grains, such as whole-wheat or whole-grain bread, crackers, tortillas, cereal, and pasta. Unsweetened oatmeal. Quinoa. Brown or wild rice. Meats and other proteins Seafood. Poultry without skin. Lean cuts of poultry and beef. Tofu. Nuts. Seeds. Dairy Low-fat or fat-free dairy products such as milk, yogurt, and cheese. The items listed above may not be a complete list of foods and beverages you can eat and drink. Contact a dietitian for more information. What foods should I avoid? Fruits Fruits canned with syrup. Vegetables Canned vegetables. Frozen vegetables  with butter or cream sauce. Grains Refined white flour and flour products such as bread, pasta, snack foods, and cereals. Avoid all processed foods. Meats and other proteins Fatty cuts of meat. Poultry with skin. Breaded or fried meats. Processed meat. Avoid saturated fats. Dairy Full-fat yogurt, cheese, or milk. Beverages Sweetened drinks, such as soda or iced tea. The items listed above may not be a complete list of foods and beverages you should avoid. Contact a dietitian for more information. Questions to ask a health care provider Do I need to meet with a certified diabetes care and education specialist? Do I need to meet with a dietitian? What number can I call if I have questions? When are the best times to check my blood glucose? Where to find more information: American Diabetes Association: diabetes.org Academy of Nutrition and Dietetics: eatright.[employer] of Diabetes and Digestive and Kidney Diseases: AmenCredit.is Association of Diabetes Care & Education Specialists: diabeteseducator.org Summary It is important to have healthy eating habits because your blood sugar (glucose) levels are greatly affected by what you eat and drink. It is important to use alcohol carefully. A healthy meal plan will help you manage your blood glucose and lower your risk of heart disease. Your  health care provider may recommend that you work with a dietitian to make a meal plan that is best for you. This information is not intended to replace advice given to you by your health care provider. Make sure you discuss any questions you have with your health care provider. Document Revised: 07/16/2020 Document Reviewed: 07/16/2020 Elsevier Patient Education  [place].

## 2021-12-08 NOTE — Assessment & Plan Note (Signed)
Uncontrolled with A1C of 10.3 today, has been out of insulin for months due to lack of income.  I stressed the absolute need to check glucose readings while on insulin, she verbalized understanding. Unfortunately we cannot adjust her insulin today, but she will start checking glucose readings and send them via MyChart Friday this week.  Continue Tresiba 45 units daily. Consider dose increase to 25 units BID.   Will await glucose readings.

## 2021-12-08 NOTE — Assessment & Plan Note (Signed)
Venlafaxine caused increased depression.   Will try Zoloft 50 mg with 1/2 tablet daily x 1 week, then 1 full tablet thereafter.  Continue bupropion XL 150 mg.   We will see her back in 6 weeks for follow up.

## 2021-12-08 NOTE — Assessment & Plan Note (Signed)
She doesn't appear acutely ill.  Differentials include telmisartan side effect, allergies, GERD.   Will have her start Zyrtec HS. She will also notify her nephrologist regarding dry, tickle cough as telmisartan could be contributing.

## 2021-12-08 NOTE — Progress Notes (Signed)
Subjective:    Patient ID: Lauren Winters, female    DOB: 16-Nov-1966, 55 y.o.   MRN: [id number]  HPI  Lauren Winters is a very pleasant 55 y.o. female with a significant medical history including type 2 diabetes, Sjogren's Disease, hypertension, chronic back pain, fibromyalgia, chronic pain syndrome, rheumatoid arthritis, CKD, who presents today for follow up of type 2 diabetes and depression.  1) Type 2 Diabetes:  Current medications include: Tresiba 45 units at bedtime. She had been out of [place] for months, resumed four days ago.   She is checking her blood glucose 0 times daily as she's "not felt great".  She is needing refills of test strips.   She is experiencing chronic numbness and pain to her feet, worse at night, "feels like my toes will explode".   Last A1C: 10.4 in June 2022 Last Eye Exam: Due Last Foot Exam: Due Pneumonia Vaccination: 2020 Urine Microalbumin: None. Managed on ARB Statin: Crestor  Dietary changes since last visit: None. Sandwiches, fast food, fried food.    Exercise: None  2) Anxiety and Depression: Chronic symptoms for years, symptoms now include feeling anxious, little motivation, feeling sad/down, feeling irritable. She denies SI/HI.  She is managed on bupropion XL 150 mg. She was once managed on Lexapro 10 mg but this was ineffective for anxiety, did help with depression. Venlafaxine was initiated in Summer 2022 which caused increased depression. She has never tried Zoloft.   3) Chronic Cough: Dry cough for the last five weeks, starts like a "tickle in my throat", starts coughing spells, occurs when she feels warm.   She denies post nasal drip, fevers, chills, heartburn, abdominal pain. She is managed on telmisartan-HCTZ per nephrology.   BP Readings from Last 3 Encounters:  12/08/21 134/62  08/19/21 117/78  07/27/21 (!) 151/68     Review of Systems  Constitutional:  Positive for fatigue.  HENT:  Negative for congestion, rhinorrhea  and sinus pressure.   Respiratory:  Positive for cough.   Neurological:  Positive for numbness.        Past Medical History:  Diagnosis Date   Anxiety    Atypical chest pain    a. ETT 05/2014: normal stress test without evidence of myocardial ischemia or chest pain at peak stress   Chronic kidney disease    Complication of anesthesia    migraine headache   Depression    Diabetes mellitus without complication ([place]) metformin   dx 2015   Dyspnea    on exertion   Fibromyalgia    Headache    Heart murmur    told back in 2007, but no mention with 2016 cardiac w/u   Hyperlipidemia    Hypertension    Lupus (systemic lupus erythematosus) ([place])    Mitral regurgitation    a. 05/2014 echo: EF >55%, nl LV/RV sys fxn, mild MR/AR, no valvular stenosis    Obesity (BMI 30-39.9)    Raynaud's phenomenon    Rheumatoid arthritis ([place])    S/P lumbar laminectomy 12/09/2017   S/P lumbar spinal fusion 05/26/2018   Sjogren's disease ([place])    Spondylolisthesis of lumbar region    Stroke [facility])    Tobacco abuse    a. ongoing; b. ongoing SOB   Wound infection after surgery 01/11/2018    Social History   Socioeconomic History   Marital status: Married    Spouse name: Not on file   Number of children: Not on file   Years of education: Not  on file   Highest education level: Not on file  Occupational History   Not on file  Tobacco Use   Smoking status: Former    Packs/day: 0.50    Years: 10.00    Pack years: 5.00    Types: Cigarettes    Quit date: 07/22/2015    Years since quitting: 6.3   Smokeless tobacco: Never  Vaping Use   Vaping Use: Never used  Substance and Sexual Activity   Alcohol use: No    Alcohol/week: 0.0 standard drinks   Drug use: No   Sexual activity: Not Currently  Other Topics Concern   Not on file  Social History Narrative   Married.   3 children.   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    Social Determinants of Health    Financial Resource Strain: Not on file  Food Insecurity: Not on file  Transportation Needs: Not on file  Physical Activity: Not on file  Stress: Not on file  Social Connections: Not on file  Intimate Partner Violence: Not on file    Past Surgical History:  Procedure Laterality Date   ABDOMINAL HYSTERECTOMY     BACK SURGERY     BONE MARROW BIOPSY  10/2019   CESAREAN SECTION     x3   LUMBAR LAMINECTOMY     LUMBAR LAMINECTOMY/DECOMPRESSION MICRODISCECTOMY N/A 03/29/2017   Procedure: Re operative Right Lumbar Three-Four Laminectomy and discectomy with Left Lumbar Three-Four, Bilateral Lumbar Four-Five Laminectomy for decompression;  Surgeon: Kevan Ny Ditty, MD;  Location: [place];  Service: Neurosurgery;  Laterality: N/A;   SPINAL CORD STIMULATOR INSERTION  12/09/2017   Procedure: LUMBAR SPINAL CORD STIMULATOR IMPLANT;  Surgeon: Eustace Moore, MD;  Location: [place];  Service: Neurosurgery;;   SPINAL CORD STIMULATOR REMOVAL N/A 01/11/2018   Procedure: LUMBAR SPINAL CORD STIMULATOR REMOVAL;  Surgeon: Eustace Moore, MD;  Location: [place];  Service: Neurosurgery;  Laterality: N/A;   TUBAL LIGATION      Family History  Problem Relation Age of Onset   CAD Father 29       CABG x 4   Liver cancer Father 41       passed   Valvular heart disease Father    Hypertension Father    CAD Paternal 68        CABG   Breast cancer Paternal Aunt    CAD Paternal Aunt        CABG   Breast cancer Paternal Aunt    CAD Paternal Aunt        CABG   Breast cancer Paternal Aunt    CAD Paternal Aunt        CABG   Breast cancer Paternal Aunt    CAD Paternal Aunt        CABG   Breast cancer Paternal Aunt    CAD Paternal Uncle        MI s/p CABG   Stroke Maternal Grandmother    Stroke Maternal Grandfather    CAD Maternal Grandfather    Heart Problems Brother     Allergies  Allergen Reactions   Pseudoephedrine Hcl Shortness Of Breath and Other (See Comments)    Room starts spinning, can't  breath   Ancef [Cefazolin] Other (See Comments)    Unknown   Clindamycin/Lincomycin Hives   Prednisone Other (See Comments)    Chest pain w/ cortisone shots or prednisone pills Elevated blood pressure   Triprolidine Hcl  Adhesive [Tape] Rash and Other (See Comments)    Sensitivity to certain band aids and adhesives - burns skin, makes skin raw   Other Rash and Other (See Comments)    Sensitivity to certain band aids and adhesives - burns skin, makes skin raw   Sudafed [Pseudoephedrine Hcl] Other (See Comments)    Dizziness     Current Outpatient Medications on File Prior to Visit  Medication Sig Dispense Refill   amLODipine (NORVASC) 10 MG tablet Take one tab daily for blood pressure. 90 tablet 1   Blood Glucose Monitoring Suppl ([place]) w/Device KIT Use as instructed to test blood sugar 3 times daily 1 kit 0   buPROPion (WELLBUTRIN XL) 150 MG 24 hr tablet TAKE 1 TABLET BY MOUTH ONCE DAILY FOR ANXIETY AND DEPRESSION. 90 tablet 0   clopidogrel (PLAVIX) 75 MG tablet TAKE 1 TABLET BY MOUTH EVERY DAY 90 tablet 1   Continuous Blood Gluc Receiver (FREESTYLE LIBRE 14 DAY READER) DEVI 1 Device by Does not apply route in the morning, at noon, and at bedtime. 1 each 0   Continuous Blood Gluc Sensor (FREESTYLE LIBRE 3 SENSOR) MISC 1 Device by Does not apply route continuous. Place 1 sensor on the skin every 14 days. Use to check glucose continuously 6 each 1   cyclobenzaprine (FLEXERIL) 10 MG tablet TAKE 1 TABLET BY MOUTH AT BEDTIME AS NEEDED FOR MUSCLE SPASMS 90 tablet 0   folic acid (FOLVITE) 1 MG tablet Take 2 tablets by mouth daily. 180 tablet 3   hydroxychloroquine (PLAQUENIL) 200 MG tablet TAKE 1 TABLET BY MOUTH TWICE A DAY 180 tablet 0   insulin degludec (TRESIBA FLEXTOUCH) 100 UNIT/ML FlexTouch Pen Inject 45-50 Units into the skin daily.     Insulin Pen Needle (PEN NEEDLES) 31G X 6 MM MISC Use with insulin as directed. 100 each 0   methotrexate (RHEUMATREX) 2.5 MG  tablet Take 7 tablets (17.5 mg total) by mouth once a week. Caution:Chemotherapy. Protect from light. 28 tablet 0   metoprolol succinate (TOPROL-XL) 50 MG 24 hr tablet Take 1 tablet (50 mg total) by mouth daily. For blood pressure 90 tablet 3   rosuvastatin (CRESTOR) 20 MG tablet Take 1 tablet (20 mg total) by mouth daily. For cholesterol 90 tablet 3   spironolactone (ALDACTONE) 25 MG tablet Take 25 mg by mouth daily.     pilocarpine (SALAGEN) 5 MG tablet Take 1 tablet (5 mg total) by mouth 2 (two) times daily. (Patient not taking: Reported on 12/08/2021) 180 tablet 0   Semaglutide,0.25 or 0.5MG/DOS, (OZEMPIC, 0.25 OR 0.5 MG/DOSE,) 2 MG/1.5ML SOPN Inject 0.25 mg once weekly x 4 weeks, then 0.5 mg once weekly thereafter for diabetes. (Patient not taking: Reported on 12/08/2021) 4.5 mL 0   telmisartan-hydrochlorothiazide (MICARDIS HCT) 80-25 MG tablet Take by mouth.     No current facility-administered medications on file prior to visit.    BP 134/62    Pulse 86    Temp 98.6 F (37 C) (Temporal)    Ht 5' 6"  (1.676 m)    Wt 209 lb (94.8 kg)    SpO2 98%    BMI 33.73 kg/m  Objective:   Physical Exam Cardiovascular:     Rate and Rhythm: Normal rate and regular rhythm.  Pulmonary:     Effort: Pulmonary effort is normal.     Breath sounds: Normal breath sounds.     Comments: Dry coughing spell noted during visit Musculoskeletal:     Cervical  back: Neck supple.  Skin:    General: Skin is warm and dry.          Assessment & Plan:      This visit occurred during the SARS-CoV-2 public health emergency.  Safety protocols were in place, including screening questions prior to the visit, additional usage of staff PPE, and extensive cleaning of exam room while observing appropriate contact time as indicated for disinfecting solutions.

## 2021-12-09 ENCOUNTER — Telehealth: Payer: Medicare Other

## 2021-12-10 ENCOUNTER — Telehealth: Payer: Self-pay | Admitting: Primary Care

## 2021-12-10 NOTE — Telephone Encounter (Signed)
Alvie Heidelberg from [employer] sent in a fax on the 13th and they need it to be signed and dated by the provider

## 2021-12-10 NOTE — Telephone Encounter (Signed)
Lauren Winters, see placard application in your inbox. Can we mail to her home please?

## 2021-12-11 NOTE — Telephone Encounter (Signed)
I believe that I completed this earlier this week.  I have nothing in my inbox now

## 2021-12-11 NOTE — Telephone Encounter (Signed)
Do you have ppw?

## 2021-12-14 NOTE — Telephone Encounter (Signed)
Looks like have been faxed no further action needed.

## 2021-12-18 ENCOUNTER — Telehealth: Payer: Self-pay | Admitting: Primary Care

## 2021-12-18 NOTE — Telephone Encounter (Signed)
Setina from [facility] [employer] called in stated they receive the fax but it was not sign or dated would like a call back # [phone] ans fax [phone]

## 2021-12-18 NOTE — Telephone Encounter (Signed)
Lauren Winters called back and said received standard written order but needs chart notes that pt has been seen within 3 months because of insurance requirements. Sending note to [facility] CMA.

## 2021-12-24 NOTE — Telephone Encounter (Signed)
Per MyChart message from patient:   Lauren Winters, I have never used this medical supply company, however [place] sent in a prescription for [employer] to a medical supply company. Im not sure if this would be the company or not.  Will go ahead and fax last office note as requested to [place] with [facility] at [phone].

## 2021-12-24 NOTE — Telephone Encounter (Addendum)
Left message for Nicoletta to return call to office to verify if she is currently using [facility] for any of her medical supply needs.

## 2021-12-30 NOTE — Progress Notes (Signed)
Office Visit Note  Patient: Lauren Winters             Date of Birth: Jan 14, 1966           MRN: [id number]             PCP: Pleas Koch, NP Referring: Pleas Koch, NP Visit Date: 01/12/2022 Occupation: @GUAROCC @  Subjective:  Generalized pain.   History of Present Illness: Lauren Winters is a 56 y.o. female with a history of systemic lupus, osteoarthritis and fibromyalgia.  She states she continues to have dry mouth and dry eyes symptoms.  She also complains of raynaud's phenominon.  She has pain and discomfort in almost all of her joints.  She has significant morning stiffness.  She has difficulty climbing stairs and getting up from the chair.  She states her knee joints have been hurting and she also has discomfort in trochanteric area.  Continues to have generalized pain from fibromyalgia.  Activities of Daily Living:  Patient reports morning stiffness for 4 hours.   Patient Reports nocturnal pain.  Difficulty dressing/grooming: Reports Difficulty climbing stairs: Reports Difficulty getting out of chair: Reports Difficulty using hands for taps, buttons, cutlery, and/or writing: Reports  Review of Systems  Constitutional:  Positive for fatigue and night sweats. Negative for weight gain and weight loss.  HENT:  Positive for mouth dryness. Negative for mouth sores, trouble swallowing, trouble swallowing and nose dryness.   Eyes:  Positive for dryness. Negative for pain, redness and visual disturbance.  Respiratory:  Negative for cough, shortness of breath and difficulty breathing.   Cardiovascular:  Positive for chest pain. Negative for palpitations, hypertension, irregular heartbeat and swelling in legs/feet.       She had cardiology w/u per patient which was normal.  Gastrointestinal:  Negative for blood in stool, constipation and diarrhea.  Endocrine: Negative for increased urination.  Genitourinary:  Negative for vaginal dryness.  Musculoskeletal:  Positive  for joint pain, joint pain and morning stiffness. Negative for joint swelling, myalgias, muscle weakness, muscle tenderness and myalgias.  Skin:  Positive for color change. Negative for rash, hair loss, skin tightness, ulcers and sensitivity to sunlight.  Allergic/Immunologic: Negative for susceptible to infections.  Neurological:  Positive for dizziness, headaches and night sweats. Negative for memory loss and weakness.  Hematological:  Negative for swollen glands.  Psychiatric/Behavioral:  Positive for depressed mood and sleep disturbance. The patient is nervous/anxious.    PMFS History:  Patient Active Problem List   Diagnosis Date Noted   Type 2 diabetes mellitus with hyperglycemia, with long-term current use of insulin ([place]) 12/08/2021   Persistent cough for 3 weeks or longer 12/08/2021   Encounter for pain management counseling 07/25/2021   History of infection of neurostimulator battery w/ removal of device 06/12/2021    Class: History of   Elevated C-reactive protein (CRP) 06/12/2021   Elevated sed rate 06/12/2021   DDD (degenerative disc disease), thoracic 06/12/2021   DDD (degenerative disc disease), cervical 06/12/2021   Osteoarthritis of hip (Right) 06/12/2021   Other idiopathic scoliosis, thoracolumbar region 06/12/2021   Fixation hardware in spine (L3-4 and L4-5) 06/12/2021   Chronic pain syndrome 06/10/2021   Pharmacologic therapy 06/10/2021   Disorder of skeletal system 06/10/2021   Problems influencing health status 06/10/2021   Elevated hemoglobin A1c 06/10/2021   Vitamin D deficiency 06/10/2021   Positive ANA (antinuclear antibody) 06/10/2021   Elevated rheumatoid factor 06/10/2021   Elevated d-dimer 06/10/2021   Chronic anticoagulation (Plavix)  06/10/2021   Chronic lower extremity pain (3ry area of Pain) (Right) 06/10/2021   Diabetic peripheral neuropathy (4th area of Pain) ([place]) 06/10/2021   Chronic, intermittent, rib pain (8th area of Pain) (Bilateral)  06/10/2021   Thoracic radiculitis (T6) (intermittent) (Bilateral) 06/10/2021   Chronic neck pain (6th area of Pain) (posterior) (Midline) (R>L) 06/10/2021   Cervicalgia 06/10/2021   Chronic upper extremity pain (7th area of Pain) (Right) 06/10/2021   Cough 01/14/2021   Adenopathy, cervical 08/18/2020   Monoclonal gammopathy 05/14/2020   Bilateral primary osteoarthritis of hip 10/19/2019   Paresthesias 03/02/2019   Chronic hand pain (Bilateral) 12/06/2018   Headache disorder 12/06/2018   Left arm weakness 10/11/2018   Numbness and tingling in left arm 10/11/2018   Essential hypertension 10/06/2018   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[place]) 10/06/2018   Stage 3 chronic kidney disease ([place]) 10/06/2018   History of stroke 08/19/2018   History of lumbar spinal fusion (L3-4, L4-5) 05/26/2018   Local infection of wound 02/07/2018   High risk medication use 02/03/2018   Trochanteric bursitis 06/09/2017   Lumbar post-laminectomy syndrome (L3-4, L4-5) 06/09/2017   Rheumatoid arthritis involving multiple sites with positive rheumatoid factor ([place]) 06/09/2017   HNP (herniated nucleus pulposus), lumbar 03/29/2017   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   Chronic shoulder pain (Right) 02/18/2017   Herniation of nucleus pulposus of cervical intervertebral disc without myelopathy 02/09/2017   Lumbosacral spondylosis with radiculopathy 01/21/2017   Primary osteoarthritis of both hands 12/22/2016   Primary osteoarthritis of both knees 12/22/2016   Spondylosis of lumbar region without myelopathy or radiculopathy 12/22/2016   Fibromyalgia 12/22/2016   Preventative health care 04/19/2016   Chronic hip pain (2ry area of Pain) (Bilateral) 03/09/2016   Disorder of sacroiliac joint 02/23/2016   Chronic low back pain (1ry area of Pain) (Bilateral) (R>L) w/o sciatica 02/04/2016   Chronic mid back pain (5th area of Pain) (Bilateral) 12/17/2015   Displacement of lumbar intervertebral disc  without myelopathy 10/29/2015   Hyperlipidemia 08/29/2015   Lupus ([place]) 08/04/2015   Rheumatoid factor positive 08/04/2015   Sjogren's syndrome ([place]) 08/04/2015   Mitral regurgitation    Atypical chest pain    Obesity (BMI 30-39.9)    GAD (generalized anxiety disorder)    Depression 05/23/2014   Chest pain 05/23/2014    Past Medical History:  Diagnosis Date   Anxiety    Atypical chest pain    a. ETT 05/2014: normal stress test without evidence of myocardial ischemia or chest pain at peak stress   Chronic kidney disease    Complication of anesthesia    migraine headache   Depression    Diabetes mellitus without complication ([place]) metformin   dx 2015   Dyspnea    on exertion   Fibromyalgia    Headache    Heart murmur    told back in 2007, but no mention with 2016 cardiac w/u   Hyperlipidemia    Hypertension    Lupus (systemic lupus erythematosus) ([place])    Mitral regurgitation    a. 05/2014 echo: EF >55%, nl LV/RV sys fxn, mild MR/AR, no valvular stenosis    Obesity (BMI 30-39.9)    Raynaud's phenomenon    Rheumatoid arthritis ([place])    S/P lumbar laminectomy 12/09/2017   S/P lumbar spinal fusion 05/26/2018   Sjogren's disease ([place])    Spondylolisthesis of lumbar region    Stroke [facility])    Tobacco abuse    a. ongoing; b. ongoing SOB  Wound infection after surgery 01/11/2018    Family History  Problem Relation Age of Onset   CAD Father 50       CABG x 4   Liver cancer Father 70       passed   Valvular heart disease Father    Hypertension Father    CAD Paternal 87        CABG   Breast cancer Paternal Aunt    CAD Paternal Aunt        CABG   Breast cancer Paternal Aunt    CAD Paternal Aunt        CABG   Breast cancer Paternal Aunt    CAD Paternal Aunt        CABG   Breast cancer Paternal Aunt    CAD Paternal Aunt        CABG   Breast cancer Paternal Aunt    CAD Paternal Uncle        MI s/p CABG   Stroke Maternal Grandmother    Stroke Maternal Grandfather     CAD Maternal Grandfather    Heart Problems Brother    Past Surgical History:  Procedure Laterality Date   ABDOMINAL HYSTERECTOMY     BACK SURGERY     BONE MARROW BIOPSY  10/2019   CESAREAN SECTION     x3   LUMBAR LAMINECTOMY     LUMBAR LAMINECTOMY/DECOMPRESSION MICRODISCECTOMY N/A 03/29/2017   Procedure: Re operative Right Lumbar Three-Four Laminectomy and discectomy with Left Lumbar Three-Four, Bilateral Lumbar Four-Five Laminectomy for decompression;  Surgeon: Kevan Ny Ditty, MD;  Location: [facility];  Service: Neurosurgery;  Laterality: N/A;   SPINAL CORD STIMULATOR INSERTION  12/09/2017   Procedure: LUMBAR SPINAL CORD STIMULATOR IMPLANT;  Surgeon: Eustace Moore, MD;  Location: [facility];  Service: Neurosurgery;;   SPINAL CORD STIMULATOR REMOVAL N/A 01/11/2018   Procedure: LUMBAR SPINAL CORD STIMULATOR REMOVAL;  Surgeon: Eustace Moore, MD;  Location: [facility];  Service: Neurosurgery;  Laterality: N/A;   TUBAL LIGATION     Social History   Social History Narrative   Married.   3 children.   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    Immunization History  Administered Date(s) Administered   Influenza,inj,Quad PF,6+ Mos 10/19/2016, 10/21/2017, 01/31/2019, 10/19/2019, 12/08/2021   PFIZER(Purple Top)SARS-COV-2 Vaccination 04/17/2020, 05/08/2020   Pneumococcal Polysaccharide-23 10/27/2014, 06/25/2019   Td 04/19/2016   Zoster, Live 10/27/2014     Objective: Vital Signs: BP (!) 128/55 (BP Location: Right Arm, Patient Position: Sitting, Cuff Size: Small)    Pulse 99    Resp 14    Ht 5' 6"  (1.676 m)    Wt 211 lb (95.7 kg)    BMI 34.06 kg/m    Physical Exam Vitals and nursing note reviewed.  Constitutional:      Appearance: She is well-developed.  HENT:     Head: Normocephalic and atraumatic.  Eyes:     Conjunctiva/sclera: Conjunctivae normal.  Cardiovascular:     Rate and Rhythm: Normal rate and regular rhythm.     Heart sounds: Normal heart  sounds.  Pulmonary:     Effort: Pulmonary effort is normal.     Breath sounds: Normal breath sounds.  Abdominal:     General: Bowel sounds are normal.     Palpations: Abdomen is soft.  Musculoskeletal:     Cervical back: Normal range of motion.  Lymphadenopathy:     Cervical: No cervical adenopathy.  Skin:  General: Skin is warm and dry.     Capillary Refill: Capillary refill takes less than 2 seconds.  Neurological:     Mental Status: She is alert and oriented to person, place, and time.  Psychiatric:        Behavior: Behavior normal.     Musculoskeletal Exam: She had limited range of motion of her cervical spine.  She had limited painful range of motion of her lumbar spine.  Shoulder joint abduction was limited to 90 degrees bilaterally.  Elbow joints and wrist joints with good range of motion.  She had no tenderness or swelling over MCPs or PIPs.  Hip joints and knee joints in good range of motion.  There was no tenderness over ankles or MTPs.  CDAI Exam: CDAI Score: -- Patient Global: --; Provider Global: -- Swollen: --; Tender: -- Joint Exam 01/12/2022   No joint exam has been documented for this visit   There is currently no information documented on the homunculus. Go to the Rheumatology activity and complete the homunculus joint exam.  Investigation: No additional findings.  Imaging: No results found.  Recent Labs: Lab Results  Component Value Date   WBC 9.3 08/19/2021   HGB 15.1 08/19/2021   PLT 458 (H) 08/19/2021   NA 135 08/19/2021   K 3.9 08/19/2021   CL 95 (L) 08/19/2021   CO2 28 08/19/2021   GLUCOSE 214 (H) 08/19/2021   BUN 13 08/19/2021   CREATININE 1.14 (H) 08/19/2021   BILITOT 0.9 08/19/2021   ALKPHOS 102 06/10/2021   AST 14 08/19/2021   ALT 9 08/19/2021   PROT 8.3 (H) 08/19/2021   ALBUMIN 4.4 06/10/2021   CALCIUM 10.1 08/19/2021   GFRAA 103 10/20/2020    Speciality Comments: PLQ Eye Exam: 10/24/2020 WNL @ [facility] Follow up  in 1 year  Procedures:  No procedures performed Allergies: Pseudoephedrine hcl, Ancef [cefazolin], Clindamycin/lincomycin, Prednisone, Triprolidine hcl, Adhesive [tape], Other, and Sudafed [pseudoephedrine hcl]   Assessment / Plan:     Visit Diagnoses: Other organ or system involvement in systemic lupus erythematosus ([place]) - +La, oral ulcers, malar rash, fatigue, photosensitivity, Raynaud's, Subcutaneous lupus-bx+, hair loss, +RF, sicca symptoms: She continues to have sicca symptoms.  She states she had a nasal ulcer around Christmas time.  She denies any recent episodes of oral ulcers, malar rash, photosensitivity.  She continues to have mild Raynauds symptoms.  She had no synovitis on examination.  Prescription refill for hydroxychloroquine, methotrexate and folic acid was given today.  High risk medication use - Plaquenil 200 mg 1 tablet by mouth twice daily, methotrexate 7 tablets by mouth once weekly, folic acid 54m po qd. labs from August 19, 2021 showed mildly elevated platelets.  Creatinine was elevated at 1.14.  At that time her methotrexate dose was decreased to 7 tablets p.o. weekly.  We will check labs today.  If her creatinine is still elevated we may reduce methotrexate further.  She had no synovitis on examination.  She was advised to hold methotrexate in case she develops an infection.  She may resume methotrexate once the infection resolves.  Information regarding realization was placed in the AVS.  Raynaud's syndrome without gangrene-  Primary osteoarthritis of both hands-she has mild osteoarthritis in her hands.  Primary osteoarthritis of both knees - She had SynviscOne injections in bilateral knee joints on 03/03/2020.  She continues to have discomfort in her knee joints.  No warmth swelling or effusion was noted.  Trochanteric bursitis of both hips-IT  band stretches were discussed.  S/P lumbar laminectomy -she has ongoing pain and discomfort in her lower back.  Patient states  she has not heard back from pain management yet.  Fibromyalgia-she continues to have generalized pain and discomfort from fibromyalgia.  She had positive tender points.  Need for stretching exercises were discussed.  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  Mitral valve insufficiency, unspecified etiology  Essential hypertension  Diabetes mellitus without complication ([place])  Vitamin D deficiency  Osteoporosis screening - May 04, 2017 DEXA scan was normal done by her PCP.  Former smoker  Orders: Orders Placed This Encounter  Procedures   Protein / creatinine ratio, urine   CBC with Differential/Platelet   COMPLETE METABOLIC PANEL WITH GFR   Anti-DNA antibody, double-stranded   C3 and C4   Sedimentation rate   VITAMIN D 25 Hydroxy (Vit-D Deficiency, Fractures)   Meds ordered this encounter  Medications   folic acid (FOLVITE) 1 MG tablet    Sig: Take 2 tablets by mouth daily.    Dispense:  180 tablet    Refill:  3   methotrexate (RHEUMATREX) 2.5 MG tablet    Sig: Take 7 tablets (17.5 mg total) by mouth once a week. Caution:Chemotherapy. Protect from light.    Dispense:  28 tablet    Refill:  2    Dose change 08/21/2021   hydroxychloroquine (PLAQUENIL) 200 MG tablet    Sig: Take 1 tablet (200 mg total) by mouth 2 (two) times daily.    Dispense:  180 tablet    Refill:  0     Follow-Up Instructions: Return in about 3 months (around 04/12/2022) for Systemic lupus.   Bo Merino, MD  Note - This record has been created using Editor, commissioning.  Chart creation errors have been sought, but may not always  have been located. Such creation errors do not reflect on  the standard of medical care.

## 2021-12-31 DIAGNOSIS — E1165 Type 2 diabetes mellitus with hyperglycemia: Secondary | ICD-10-CM | POA: Diagnosis not present

## 2022-01-12 ENCOUNTER — Ambulatory Visit: Payer: Medicare Other | Admitting: Rheumatology

## 2022-01-12 ENCOUNTER — Encounter: Payer: Self-pay | Admitting: Rheumatology

## 2022-01-12 ENCOUNTER — Encounter: Payer: Self-pay | Admitting: Cardiology

## 2022-01-12 ENCOUNTER — Other Ambulatory Visit: Payer: Self-pay

## 2022-01-12 VITALS — BP 128/55 | HR 99 | Resp 14 | Ht 66.0 in | Wt 211.0 lb

## 2022-01-12 DIAGNOSIS — M7062 Trochanteric bursitis, left hip: Secondary | ICD-10-CM

## 2022-01-12 DIAGNOSIS — I340 Nonrheumatic mitral (valve) insufficiency: Secondary | ICD-10-CM

## 2022-01-12 DIAGNOSIS — Z8673 Personal history of transient ischemic attack (TIA), and cerebral infarction without residual deficits: Secondary | ICD-10-CM | POA: Diagnosis not present

## 2022-01-12 DIAGNOSIS — Z9889 Other specified postprocedural states: Secondary | ICD-10-CM

## 2022-01-12 DIAGNOSIS — Z87891 Personal history of nicotine dependence: Secondary | ICD-10-CM

## 2022-01-12 DIAGNOSIS — Z79899 Other long term (current) drug therapy: Secondary | ICD-10-CM | POA: Diagnosis not present

## 2022-01-12 DIAGNOSIS — M797 Fibromyalgia: Secondary | ICD-10-CM

## 2022-01-12 DIAGNOSIS — I10 Essential (primary) hypertension: Secondary | ICD-10-CM

## 2022-01-12 DIAGNOSIS — E559 Vitamin D deficiency, unspecified: Secondary | ICD-10-CM | POA: Diagnosis not present

## 2022-01-12 DIAGNOSIS — M19041 Primary osteoarthritis, right hand: Secondary | ICD-10-CM | POA: Diagnosis not present

## 2022-01-12 DIAGNOSIS — E119 Type 2 diabetes mellitus without complications: Secondary | ICD-10-CM

## 2022-01-12 DIAGNOSIS — M170 Bilateral primary osteoarthritis of knee: Secondary | ICD-10-CM

## 2022-01-12 DIAGNOSIS — Z13820 Encounter for screening for osteoporosis: Secondary | ICD-10-CM

## 2022-01-12 DIAGNOSIS — M19042 Primary osteoarthritis, left hand: Secondary | ICD-10-CM

## 2022-01-12 DIAGNOSIS — M3219 Other organ or system involvement in systemic lupus erythematosus: Secondary | ICD-10-CM

## 2022-01-12 DIAGNOSIS — M7061 Trochanteric bursitis, right hip: Secondary | ICD-10-CM | POA: Diagnosis not present

## 2022-01-12 DIAGNOSIS — M3509 Sicca syndrome with other organ involvement: Secondary | ICD-10-CM

## 2022-01-12 DIAGNOSIS — I7300 Raynaud's syndrome without gangrene: Secondary | ICD-10-CM

## 2022-01-12 MED ORDER — METHOTREXATE 2.5 MG PO TABS
17.5000 mg | ORAL_TABLET | ORAL | 2 refills | Status: DC
Start: 2022-01-12 — End: 2022-03-01

## 2022-01-12 MED ORDER — HYDROXYCHLOROQUINE SULFATE 200 MG PO TABS: 200.0000 mg | ORAL_TABLET | Freq: Two times a day (BID) | ORAL | 0 refills | Status: DC

## 2022-01-12 MED ORDER — FOLIC ACID 1 MG PO TABS: ORAL_TABLET | ORAL | 3 refills | Status: AC

## 2022-01-12 NOTE — Patient Instructions (Signed)
Standing Labs °We placed an order today for your standing lab work.  ° °Please have your standing labs drawn in April and every 3 months ° °If possible, please have your labs drawn 2 weeks prior to your appointment so that the provider can discuss your results at your appointment. ° °Please note that you may see your imaging and lab results in MyChart before we have reviewed them. °We may be awaiting multiple results to interpret others before contacting you. °Please allow our office up to 72 hours to thoroughly review all of the results before contacting the office for clarification of your results. ° °We have open lab daily: °Monday through Thursday from 1:30-4:30 PM and Friday from 1:30-4:00 PM °at the office of Dr. Kamarie Palma, [facility].   °Please be advised, all patients with office appointments requiring lab work will take precedent over walk-in lab work.  °If possible, please come for your lab work on Monday and Friday afternoons, as you may experience shorter wait times. °The office is located at [address], [address], [place], [place] [address] °No appointment is necessary.   °Labs are drawn by [facility]. Please bring your co-pay at the time of your lab draw.  You may receive a bill from [facility] for your lab work. ° °Please note if you are on Hydroxychloroquine and and an order has been placed for a Hydroxychloroquine level, you will need to have it drawn 4 hours or more after your last dose. ° °If you wish to have your labs drawn at another location, please call the office 24 hours in advance to send orders. ° °If you have any questions regarding directions or hours of operation,  °please call [phone].   °As a reminder, please drink plenty of water prior to coming for your lab work. Thanks!  °Vaccines °You are taking a medication(s) that can suppress your immune system.  The following immunizations are recommended: °Flu annually °Covid-19  °Td/Tdap (tetanus, diphtheria, pertussis)  every 10 years °Pneumonia (Prevnar 15 then Pneumovax 23 at least 1 year apart.  Alternatively, can take Prevnar 20 without needing additional dose) °Shingrix: 2 doses from 4 weeks to 6 months apart ° °Please check with your PCP to make sure you are up to date.  ° °If you have signs or symptoms of an infection or start antibiotics: °First, call your PCP for workup of your infection. °Hold your medication through the infection, until you complete your antibiotics, and until symptoms resolve if you take the following: °Injectable medication (Actemra, Benlysta, Cimzia, Cosentyx, Enbrel, Humira, Kevzara, Orencia, Remicade, Simponi, Stelara, Taltz, Tremfya) °Methotrexate °Leflunomide (Arava) °Mycophenolate (Cellcept) °Xeljanz, Olumiant, or Rinvoq  °

## 2022-01-13 ENCOUNTER — Encounter: Payer: Self-pay | Admitting: Rheumatology

## 2022-01-13 ENCOUNTER — Other Ambulatory Visit: Payer: Self-pay | Admitting: *Deleted

## 2022-01-13 ENCOUNTER — Ambulatory Visit: Payer: Medicare Other | Admitting: Nurse Practitioner

## 2022-01-13 ENCOUNTER — Encounter: Payer: Self-pay | Admitting: Nurse Practitioner

## 2022-01-13 ENCOUNTER — Telehealth: Payer: Self-pay

## 2022-01-13 ENCOUNTER — Ambulatory Visit: Payer: Self-pay

## 2022-01-13 ENCOUNTER — Other Ambulatory Visit: Payer: Self-pay | Admitting: Family Medicine

## 2022-01-13 VITALS — BP 110/60 | HR 91 | Ht 66.0 in | Wt 209.0 lb

## 2022-01-13 DIAGNOSIS — F32A Depression, unspecified: Secondary | ICD-10-CM

## 2022-01-13 DIAGNOSIS — I10 Essential (primary) hypertension: Secondary | ICD-10-CM

## 2022-01-13 DIAGNOSIS — R002 Palpitations: Secondary | ICD-10-CM

## 2022-01-13 DIAGNOSIS — Z794 Long term (current) use of insulin: Secondary | ICD-10-CM

## 2022-01-13 DIAGNOSIS — E782 Mixed hyperlipidemia: Secondary | ICD-10-CM | POA: Diagnosis not present

## 2022-01-13 DIAGNOSIS — E1165 Type 2 diabetes mellitus with hyperglycemia: Secondary | ICD-10-CM | POA: Diagnosis not present

## 2022-01-13 DIAGNOSIS — E559 Vitamin D deficiency, unspecified: Secondary | ICD-10-CM

## 2022-01-13 DIAGNOSIS — F419 Anxiety disorder, unspecified: Secondary | ICD-10-CM

## 2022-01-13 LAB — PROTEIN / CREATININE RATIO, URINE
Creatinine, Urine: 61 mg/dL (ref 20–275)
Protein/Creat Ratio: 98 mg/g creat (ref 24–184)
Protein/Creatinine Ratio: 0.098 mg/mg creat (ref 0.024–0.184)
Total Protein, Urine: 6 mg/dL (ref 5–24)

## 2022-01-13 LAB — COMPLETE METABOLIC PANEL WITH GFR
AG Ratio: 1.1 (calc) (ref 1.0–2.5)
ALT: 11 U/L (ref 6–29)
AST: 19 U/L (ref 10–35)
Albumin: 4.5 g/dL (ref 3.6–5.1)
Alkaline phosphatase (APISO): 103 U/L (ref 37–153)
BUN: 12 mg/dL (ref 7–25)
CO2: 31 mmol/L (ref 20–32)
Calcium: 10.1 mg/dL (ref 8.6–10.4)
Chloride: 97 mmol/L — ABNORMAL LOW (ref 98–110)
Creat: 0.85 mg/dL (ref 0.50–1.03)
Globulin: 4.1 g/dL (calc) — ABNORMAL HIGH (ref 1.9–3.7)
Glucose, Bld: 80 mg/dL (ref 65–99)
Potassium: 4.1 mmol/L (ref 3.5–5.3)
Sodium: 137 mmol/L (ref 135–146)
Total Bilirubin: 0.7 mg/dL (ref 0.2–1.2)
Total Protein: 8.6 g/dL — ABNORMAL HIGH (ref 6.1–8.1)
eGFR: 80 mL/min/{1.73_m2} (ref >=60)

## 2022-01-13 LAB — CBC WITH DIFFERENTIAL/PLATELET
Absolute Monocytes: 458 cells/uL (ref 200–950)
Basophils Absolute: 94 cells/uL (ref 0–200)
Basophils Relative: 0.9 %
Eosinophils Absolute: 83 cells/uL (ref 15–500)
Eosinophils Relative: 0.8 %
HCT: 47.3 % — ABNORMAL HIGH (ref 35.0–45.0)
Hemoglobin: 15.7 g/dL — ABNORMAL HIGH (ref 11.7–15.5)
Lymphs Abs: 2933 cells/uL (ref 850–3900)
MCH: 28 pg (ref 27.0–33.0)
MCHC: 33.2 g/dL (ref 32.0–36.0)
MCV: 84.3 fL (ref 80.0–100.0)
MPV: 9 fL (ref 7.5–12.5)
Monocytes Relative: 4.4 %
Neutro Abs: 6833 cells/uL (ref 1500–7800)
Neutrophils Relative %: 65.7 %
Platelets: 458 10*3/uL — ABNORMAL HIGH (ref 140–400)
RBC: 5.61 10*6/uL — ABNORMAL HIGH (ref 3.80–5.10)
RDW: 13.1 % (ref 11.0–15.0)
Total Lymphocyte: 28.2 %
WBC: 10.4 10*3/uL (ref 3.8–10.8)

## 2022-01-13 LAB — SEDIMENTATION RATE: Sed Rate: 39 mm/h — ABNORMAL HIGH (ref 0–30)

## 2022-01-13 LAB — C3 AND C4
C3 Complement: 206 mg/dL — ABNORMAL HIGH (ref 83–193)
C4 Complement: 32 mg/dL (ref 15–57)

## 2022-01-13 LAB — VITAMIN D 25 HYDROXY (VIT D DEFICIENCY, FRACTURES): Vit D, 25-Hydroxy: 12 ng/mL — ABNORMAL LOW (ref 30–100)

## 2022-01-13 LAB — ANTI-DNA ANTIBODY, DOUBLE-STRANDED: ds DNA Ab: <1 IU/mL

## 2022-01-13 MED ORDER — VITAMIN D (ERGOCALCIFEROL) 1.25 MG (50000 UNIT) PO CAPS: 50000.0000 [IU] | ORAL_CAPSULE | ORAL | 0 refills | Status: DC

## 2022-01-13 NOTE — Progress Notes (Signed)
Vitamin D is low please call in vitamin D 50,000 units twice a week for 3 months.  Repeat vitamin D in 3 months.  Patient should start on vitamin D 2000 units daily after she finishes the prescription.

## 2022-01-13 NOTE — Telephone Encounter (Signed)
-----   Message from Bo Merino, MD sent at 01/13/2022 11:58 AM EST ----- Vitamin D is low please call in vitamin D 50,000 units twice a week for 3 months.  Repeat vitamin D in 3 months.  Patient should start on vitamin D 2000 units daily after she finishes the prescription.

## 2022-01-13 NOTE — Progress Notes (Signed)
Office Visit    Patient Name: Lauren Winters Date of Encounter: 01/13/2022  Primary Care Provider:  Pleas Koch, NP Primary Cardiologist:  [name] Sable, MD  Chief Complaint    56 year old female with a history of atypical chest pain, stroke, palpitations, hypertension, hyperlipidemia, lupus, fibromyalgia, Sjogren's syndrome, diabetes, depression, and prior tobacco abuse, who presents for follow-up related to irregular heart rhythm.  Past Medical History    Past Medical History:  Diagnosis Date   Anxiety    Atypical chest pain    a. ETT 05/2014: normal stress test without evidence of myocardial ischemia or chest pain at peak stress; b. 02/2021 Cor CTA: Ca2+ score = 0. Nl cors.   Complication of anesthesia    migraine headache   Depression    Diabetes mellitus without complication ([place]) metformin   dx 2015   Dyspnea    on exertion   Fibromyalgia    Headache    Heart murmur    a. told back in 2007, but no mention with 2016 cardiac w/u; b. 02/2019 Echo: EF 60-65%, conc LVH. Nl RV fxn. Mild Ca2+ of MV. Triv AI.   Hyperlipidemia    Hypertension    Lupus (systemic lupus erythematosus) ([place])    Mitral regurgitation    a. 05/2014 Echo: Mild MR/AI; b. 05/2019 Echo: Mild Ca2+ of MV.   Obesity (BMI 30-39.9)    Palpitations    a. 02/2021 Zio: Predominantly SR @ 75 (44-167). 8 SVT runs (max 167 bpm, longest 10 beats). ? Atrial tach. Triggered events assoc w/ SVT. Otw rare isolated PACs/PVCs. No afib/flutter.   Raynaud's phenomenon    Rheumatoid arthritis ([place])    S/P lumbar laminectomy 12/09/2017   S/P lumbar spinal fusion 05/26/2018   Sjogren's disease ([place])    Spondylolisthesis of lumbar region    Stroke [facility])    Tobacco abuse    a. ongoing; b. ongoing SOB   Wound infection after surgery 01/11/2018   Past Surgical History:  Procedure Laterality Date   ABDOMINAL HYSTERECTOMY     BACK SURGERY     BONE MARROW BIOPSY  10/2019   CESAREAN SECTION     x3   LUMBAR  LAMINECTOMY     LUMBAR LAMINECTOMY/DECOMPRESSION MICRODISCECTOMY N/A 03/29/2017   Procedure: Re operative Right Lumbar Three-Four Laminectomy and discectomy with Left Lumbar Three-Four, Bilateral Lumbar Four-Five Laminectomy for decompression;  Surgeon: Kevan Ny Ditty, MD;  Location: [place];  Service: Neurosurgery;  Laterality: N/A;   SPINAL CORD STIMULATOR INSERTION  12/09/2017   Procedure: LUMBAR SPINAL CORD STIMULATOR IMPLANT;  Surgeon: Eustace Moore, MD;  Location: [place];  Service: Neurosurgery;;   SPINAL CORD STIMULATOR REMOVAL N/A 01/11/2018   Procedure: LUMBAR SPINAL CORD STIMULATOR REMOVAL;  Surgeon: Eustace Moore, MD;  Location: [place];  Service: Neurosurgery;  Laterality: N/A;   TUBAL LIGATION      Allergies  Allergies  Allergen Reactions   Pseudoephedrine Hcl Shortness Of Breath and Other (See Comments)    Room starts spinning, can't breath   Ancef [Cefazolin] Other (See Comments)    Unknown   Clindamycin/Lincomycin Hives   Prednisone Other (See Comments)    Chest pain w/ cortisone shots or prednisone pills Elevated blood pressure   Triprolidine Hcl    Adhesive [Tape] Rash and Other (See Comments)    Sensitivity to certain band aids and adhesives - burns skin, makes skin raw   Other Rash and Other (See Comments)    Sensitivity to certain  band aids and adhesives - burns skin, makes skin raw   Sudafed [Pseudoephedrine Hcl] Other (See Comments)    Dizziness     History of Present Illness    56 year old female with the above past medical history including atypical chest pain, stroke, palpitations, hypertension, hyperlipidemia, lupus, Sjogren's syndrome diabetes, depression, and prior tobacco abuse.  She suffered a stroke in 2019 with symptoms of left-sided weakness.  This was felt to be secondary to cardiac risk factors including diabetes and hypertension.  Echocardiogram in March 2020 showed an EF of 60 to 65% with concentric LVH, mild calcification of the mitral valve,  and trivial AI.  In February 2022, she reported palpitations and precordial chest pain and underwent coronary CTA which showed normal coronary arteries with a calcium score of 0.  Zio monitor showed predominantly sinus rhythm with 8 runs of SVT at a maximum rate 167 bpm and a longest event of 10 beats.  There was question of atrial tachycardia.  Triggered events were associated with SVT.  She has been maintained on oral beta-blocker therapy and over the past 10 months, has generally done well with only occasional skipped beats but no sustained tachyarrhythmias.  On January 17, she awoke with a headache and felt very rundown throughout the day.  She did note occasional palpitations.  When she was seen by her rheumatologist in the afternoon, it was noted that she had an irregular heartbeat and she was advised to follow-up with cardiology out of fear for atrial fibrillation.  Symptoms of headache and fatigue persisted throughout the day.  She is asymptomatic today.  She is in sinus rhythm currently.  She denies chest pain, dyspnea, PND, orthopnea, dizziness, syncope, edema, or early satiety.  Home Medications    Current Outpatient Medications  Medication Sig Dispense Refill   amLODipine (NORVASC) 10 MG tablet Take one tab daily for blood pressure. 90 tablet 1   Blood Glucose Monitoring Suppl ([place]) w/Device KIT Use as instructed to test blood sugar 3 times daily 1 kit 0   buPROPion (WELLBUTRIN XL) 150 MG 24 hr tablet TAKE 1 TABLET BY MOUTH ONCE DAILY FOR ANXIETY AND DEPRESSION. 90 tablet 0   clopidogrel (PLAVIX) 75 MG tablet TAKE 1 TABLET BY MOUTH EVERY DAY 90 tablet 1   Continuous Blood Gluc Receiver (FREESTYLE LIBRE 14 DAY READER) DEVI 1 Device by Does not apply route in the morning, at noon, and at bedtime. 1 each 0   Continuous Blood Gluc Sensor (FREESTYLE LIBRE 3 SENSOR) MISC 1 Device by Does not apply route continuous. Place 1 sensor on the skin every 14 days. Use to check glucose  continuously 6 each 1   cyclobenzaprine (FLEXERIL) 10 MG tablet TAKE 1 TABLET BY MOUTH AT BEDTIME AS NEEDED FOR MUSCLE SPASMS 90 tablet 0   folic acid (FOLVITE) 1 MG tablet Take 2 tablets by mouth daily. 180 tablet 3   glucose blood test strip Use as instructed to blood sugar 3 times daily 300 each 2   hydroxychloroquine (PLAQUENIL) 200 MG tablet Take 1 tablet (200 mg total) by mouth 2 (two) times daily. 180 tablet 0   insulin degludec (TRESIBA FLEXTOUCH) 100 UNIT/ML FlexTouch Pen Inject 45-50 Units into the skin daily.     Insulin Pen Needle (PEN NEEDLES) 31G X 6 MM MISC Use with insulin as directed. 100 each 0   methotrexate (RHEUMATREX) 2.5 MG tablet Take 7 tablets (17.5 mg total) by mouth once a week. Caution:Chemotherapy. Protect from light. [place]  tablet 2   metoprolol succinate (TOPROL-XL) 50 MG 24 hr tablet Take 1 tablet (50 mg total) by mouth daily. For blood pressure 90 tablet 3   pilocarpine (SALAGEN) 5 MG tablet Take 1 tablet (5 mg total) by mouth 2 (two) times daily. 180 tablet 0   rosuvastatin (CRESTOR) 20 MG tablet Take 1 tablet (20 mg total) by mouth daily. For cholesterol 90 tablet 3   Semaglutide,0.25 or 0.5MG/DOS, (OZEMPIC, 0.25 OR 0.5 MG/DOSE,) 2 MG/1.5ML SOPN Inject 0.25 mg once weekly x 4 weeks, then 0.5 mg once weekly thereafter for diabetes. 4.5 mL 0   sertraline (ZOLOFT) 50 MG tablet Take 1 tablet (50 mg total) by mouth daily. For anxiety and depression. 90 tablet 0   spironolactone (ALDACTONE) 25 MG tablet Take 25 mg by mouth daily.     telmisartan-hydrochlorothiazide (MICARDIS HCT) 80-25 MG tablet Take by mouth.     [START ON 01/14/2022] Vitamin D, Ergocalciferol, (DRISDOL) 1.25 MG (50000 UNIT) CAPS capsule Take 1 capsule (50,000 Units total) by mouth 2 (two) times a week. 24 capsule 0   No current facility-administered medications for this visit.     Review of Systems    Felt rundown with a headache and occasional palpitations yesterday.  She denies chest pain, dyspnea,  PND, orthopnea, dizziness, syncope, edema, or early satiety.  All other systems reviewed and are otherwise negative except as noted above.    Physical Exam    VS:  BP 110/60 (BP Location: Left Arm, Patient Position: Sitting, Cuff Size: Normal)    Pulse 91    Ht 5' 6" (1.676 m)    Wt 209 lb (94.8 kg)    SpO2 99%    BMI 33.73 kg/m  , BMI Body mass index is 33.73 kg/m.     GEN: Well nourished, well developed, in no acute distress. HEENT: normal. Neck: Supple, no JVD, carotid bruits, or masses. Cardiac: RRR, no murmurs, rubs, or gallops. No clubbing, cyanosis, edema.  Radials/PT 2+ and equal bilaterally.  Respiratory:  Respirations regular and unlabored, clear to auscultation bilaterally. GI: Soft, nontender, nondistended, BS + x 4. MS: no deformity or atrophy. Skin: warm and dry, no rash. Neuro:  Strength and sensation are intact. Psych: Normal affect.  Accessory Clinical Findings    ECG personally reviewed by me today -regular sinus rhythm, 91 - no acute changes.  Lab Results  Component Value Date   WBC 10.4 01/12/2022   HGB 15.7 (H) 01/12/2022   HCT 47.3 (H) 01/12/2022   MCV 84.3 01/12/2022   PLT 458 (H) 01/12/2022   Lab Results  Component Value Date   CREATININE 0.85 01/12/2022   BUN 12 01/12/2022   NA 137 01/12/2022   K 4.1 01/12/2022   CL 97 (L) 01/12/2022   CO2 31 01/12/2022   Lab Results  Component Value Date   ALT 11 01/12/2022   AST 19 01/12/2022   ALKPHOS 102 06/10/2021   BILITOT 0.7 01/12/2022   Lab Results  Component Value Date   CHOL 182 02/17/2021   HDL 43.60 02/17/2021   LDLCALC 91 03/03/2019   LDLDIRECT 103.0 02/17/2021   TRIG 219.0 (H) 02/17/2021   CHOLHDL 4 02/17/2021    Lab Results  Component Value Date   HGBA1C 10.3 (A) 12/08/2021   Lab Results  Component Value Date   TSH 0.99 02/17/2021     Assessment & Plan    1.  Irregular heart rhythm/palpitations/PSVT: Patient with a history of palpitations and documented brief runs of PSVT  on  monitoring in March 2020.  This is managed with beta-blocker therapy and for the most part, she rarely experiences isolated skipped beats.  On January 17, she was experiencing a headache with fatigue and was noted by her rheumatologist to have an irregular heart rhythm, which was concerning for A. fib.  She is in sinus rhythm today.  Atrial fibrillation was not noted on prior monitoring.  Labs yesterday showed stable renal function and electrolytes.  I will re-place a Zio monitor to reassess for arrhythmias.  Continue current dose of metoprolol.  If no findings on monitoring, I encouraged her to consider purchasing a KardiaMobile device or smart watch.  2.  History of chest pain: Coronary CTA in March 2022 without obstructive coronary disease and calcium score of 0.  No recent chest pain.  3.  Essential hypertension: Blood pressure stable at 110/60 on amlodipine, ARB, diuretic, MRA, and beta-blocker therapy.  4.  Hyperlipidemia: She remains on statin therapy with an LDL of 103 in February 2022.  5.  History of stroke: Stable.  On chronic Plavix and statin therapy.  6.  Rheumatoid arthritis: On methotrexate and followed closely by rheumatology.  7.  Type 2 diabetes mellitus: Poorly controlled and A1c of 10.3 in December.  She is on insulin and Ozempic.  Management per primary care.  8.  Disposition: Follow-up Zio monitoring.  Follow-up in clinic in 4 to 6 weeks or sooner if necessary.   Murray Hodgkins, NP 01/13/2022, 2:11 PM

## 2022-01-13 NOTE — Patient Instructions (Signed)
Medication Instructions:   Your physician recommends that you continue on your current medications as directed. Please refer to the Current Medication list given to you today.   *If you need a refill on your cardiac medications before your next appointment, please call your pharmacy*   Lab Work: None ordered  If you have labs (blood work) drawn today and your tests are completely normal, you will receive your results only by: [place] (if you have MyChart) OR A paper copy in the mail If you have any lab test that is abnormal or we need to change your treatment, we will call you to review the results.   Testing/Procedures:  Your physician has recommended that you wear a Zio monitor for 14 days, this will be mailed to your home. This monitor is a medical device that records the hearts electrical activity. Doctors most often use these monitors to diagnose arrhythmias. Arrhythmias are problems with the speed or rhythm of the heartbeat. The monitor is a small device applied to your chest. You can wear one while you do your normal daily activities. While wearing this monitor if you have any symptoms to push the button and record what you felt. Once you have worn this monitor for the period of time provider prescribed (Usually 14 days), you will return the monitor device in the postage paid box. Once it is returned they will download the data collected and provide [place] with a report which the provider will then review and we will call you with those results. Important tips:  Avoid showering during the first 24 hours of wearing the monitor. Avoid excessive sweating to help maximize wear time. Do not submerge the device, no hot tubs, and no swimming pools. Keep any lotions or oils away from the patch. After 24 hours you may shower with the patch on. Take brief showers with your back facing the shower head.  Do not remove patch once it has been placed because that will interrupt data and  decrease adhesive wear time. Push the button when you have any symptoms and write down what you were feeling. Once you have completed wearing your monitor, remove and place into box which has postage paid and place in your outgoing mailbox.  If for some reason you have misplaced your box then call our office and we can provide another box and/or mail it off for you.        Follow-Up: At [facility], you and your health needs are our priority.  As part of our continuing mission to provide you with exceptional heart care, we have created designated Provider Care Teams.  These Care Teams include your primary Cardiologist (physician) and Advanced Practice Providers (APPs -  Physician Assistants and Nurse Practitioners) who all work together to provide you with the care you need, when you need it.  We recommend signing up for the patient portal called "MyChart".  Sign up information is provided on this After Visit Summary.  MyChart is used to connect with patients for Virtual Visits (Telemedicine).  Patients are able to view lab/test results, encounter notes, upcoming appointments, etc.  Non-urgent messages can be sent to your provider as well.   To learn more about what you can do with MyChart, go to [email or web address].    Your next appointment:   4-6 week(s)  The format for your next appointment:   In Person  Provider:   You may see [name] Sable, MD or one of the following Advanced Practice Providers on  your designated Care Team:   Murray Hodgkins, NP Christell Faith, PA-C Cadence Kathlen Mody, PA-C:1}    Other Instructions N/A

## 2022-01-13 NOTE — Telephone Encounter (Signed)
Called patient in reference to her MyChart message as copied and pasted below. Was able to offer her an appointment with Ignacia Bayley this morning. Patient is on her way in now.  Hi! I went to my rheumatologist appt today. She wanted me to contact my primary care Dr and let her know what happened at appt. When they checked heart rate/pulse it was 170 but did go down to 99, blood pressure normal. My head felt like it was going to explode and I felt like I was on a caffeine overload, extremely shaky and jittery feeling. Dr. Hyman Hopes said I definitely was having symptoms of being in a fib (I think thats what she called it), when she checked my heart. I contacted my primary care doctor and she said to contact you asap. I was last seen in March 2022. Should I schedule appointment? Thank you, Lauren Winters

## 2022-01-14 NOTE — Progress Notes (Signed)
CBC and CMP are stable.  Sed rate is elevated and stable.  Complements are normal.  Urine protein is negative.  Double-stranded DNA negative.  No change in treatment advised.

## 2022-01-27 ENCOUNTER — Ambulatory Visit: Payer: Medicare Other | Admitting: Primary Care

## 2022-01-30 DIAGNOSIS — E1165 Type 2 diabetes mellitus with hyperglycemia: Secondary | ICD-10-CM | POA: Diagnosis not present

## 2022-02-10 ENCOUNTER — Other Ambulatory Visit: Payer: Self-pay

## 2022-02-10 ENCOUNTER — Encounter: Payer: Self-pay | Admitting: Primary Care

## 2022-02-10 ENCOUNTER — Ambulatory Visit (INDEPENDENT_AMBULATORY_CARE_PROVIDER_SITE_OTHER): Payer: Medicare Other | Admitting: Primary Care

## 2022-02-10 ENCOUNTER — Other Ambulatory Visit: Payer: Self-pay | Admitting: Primary Care

## 2022-02-10 DIAGNOSIS — F411 Generalized anxiety disorder: Secondary | ICD-10-CM | POA: Diagnosis not present

## 2022-02-10 DIAGNOSIS — E1165 Type 2 diabetes mellitus with hyperglycemia: Secondary | ICD-10-CM | POA: Diagnosis not present

## 2022-02-10 DIAGNOSIS — G8929 Other chronic pain: Secondary | ICD-10-CM | POA: Diagnosis not present

## 2022-02-10 DIAGNOSIS — M25551 Pain in right hip: Secondary | ICD-10-CM

## 2022-02-10 DIAGNOSIS — M25552 Pain in left hip: Secondary | ICD-10-CM | POA: Diagnosis not present

## 2022-02-10 DIAGNOSIS — E119 Type 2 diabetes mellitus without complications: Secondary | ICD-10-CM

## 2022-02-10 DIAGNOSIS — F331 Major depressive disorder, recurrent, moderate: Secondary | ICD-10-CM | POA: Diagnosis not present

## 2022-02-10 DIAGNOSIS — Z794 Long term (current) use of insulin: Secondary | ICD-10-CM | POA: Diagnosis not present

## 2022-02-10 MED ORDER — SERTRALINE HCL 50 MG PO TABS: 75.0000 mg | ORAL_TABLET | Freq: Every day | ORAL | 1 refills | Status: DC

## 2022-02-10 MED ORDER — OZEMPIC (0.25 OR 0.5 MG/DOSE) 2 MG/1.5ML ~~LOC~~ SOPN: 1.0000 mg | PEN_INJECTOR | SUBCUTANEOUS | 1 refills | Status: DC

## 2022-02-10 NOTE — Assessment & Plan Note (Signed)
Improving based on glucose log sheets today.  Continue Tresiba 45 units, move to AM as she is dropping in the 50's overnight.  Increase Ozempic to 1 mg weekly.   We will see her back in 1 month for diabetes follow up.

## 2022-02-10 NOTE — Assessment & Plan Note (Signed)
Improving but not at goal.  Discussed options, we decided to increase Zoloft to 75 mg daily and continue bupropion XL 150 mg daily.  We will see her back in 1 month.

## 2022-02-10 NOTE — Patient Instructions (Signed)
We increased your Ozempic to 1 mg weekly for diabetes. I sent a new prescription.   We increased your Zoloft to 75 mg, take 1 and 1/2 tablet daily. I sent a new prescription.  Move your Tyler Aas to the morning. Continue 45 units.  We will see you in 1 month!

## 2022-02-10 NOTE — Assessment & Plan Note (Signed)
Acute on chronic flare. No alarm signs on HPI, but she does need to be evaluated sooner rather than later.  Recommended she call her neurosurgery for an appointment and evaluation.

## 2022-02-10 NOTE — Progress Notes (Signed)
Subjective:    Patient ID: Lauren Winters, female    DOB: 1966/05/15, 56 y.o.   MRN: [id number]  HPI  Lauren Winters is a very pleasant 56 y.o. female with a significant medical history including anxiety and depression, Sjogren's syndrome, type 2 diabetes, Lupus, CKD, chronic back pain, rheumatoid arthritis who presents today for follow up of depression/anxiety and type 2 diabetes.  1) Depression and Anxiety: She was last evaluated on 12/08/21 for follow up. During this visit she endorsed continued symptoms of anxiety and depression. Previously managed on venlafaxine but she endorsed this caused depression while taking. She was compliant to bupropion XL 150 mg, no issues with this medication, but her anxiety was uncontrolled so we added Zoloft 50 mg.   Since her last visit she's noticed some improvement, not quite at goal. Positive effects include more motivation to get out of bed, less tearfulness, slight reduction in anxiety. She denies nausea, GI upset, Si/HI.   2) Type 2 Diabetes:  Current medications include: Tresiba 45 units. Ozempic 0.5 mg weekly.   She is checking her blood glucose 3 times daily and is getting readings of:  AM fasting: mostly mid to high 100's, some low 200's 2 hours after lunch: low to mid 100's 2 hours after dinner: mid 100's Bedtime: mid to high 100's.   Last A1C: 10.4 in December 2022 Last Eye Exam: Last Foot Exam:  Pneumonia Vaccination: 2020 Urine Microalbumin: None. Managed on ARB Statin: Crestor  Dietary changes since last visit: Less portion sizes. Increasing vegetables and protein.    Exercise: None.  BP Readings from Last 3 Encounters:  02/10/22 126/80  01/13/22 110/60  01/12/22 (!) 128/55   3) Chronic Back Pain/Chronic Hip Pain: Long history of back and hip pain. Over the last 2 months she's noticed increased right hip pain with radiation to the right groin and down the front and lateral knee cap. She has noticed intermittent tingling  to her right groin.   She denies loss of bowel/bladder control, recent falls/injury.   She follows with rheumatology, she mentioned this to them during her last visit, and they placed a referral to pain management in early December 2022, she has yet to hear from pain management despite multiple calls.   Previously following with orthopedics and neurosurgery, was told to call back with new symptoms. She plans on doing so today.   Review of Systems  Cardiovascular:  Negative for chest pain.  Gastrointestinal:  Negative for nausea.  Neurological:  Positive for numbness.  Psychiatric/Behavioral:  The patient is nervous/anxious.        See HPI        Past Medical History:  Diagnosis Date   Anxiety    Atypical chest pain    a. ETT 05/2014: normal stress test without evidence of myocardial ischemia or chest pain at peak stress; b. 02/2021 Cor CTA: Ca2+ score = 0. Nl cors.   Complication of anesthesia    migraine headache   Depression    Diabetes mellitus without complication ([place]) metformin   dx 2015   Dyspnea    on exertion   Fibromyalgia    Headache    Heart murmur    a. told back in 2007, but no mention with 2016 cardiac w/u; b. 02/2019 Echo: EF 60-65%, conc LVH. Nl RV fxn. Mild Ca2+ of MV. Triv AI.   Hyperlipidemia    Hypertension    Lupus (systemic lupus erythematosus) (HCC)    Mitral regurgitation  a. 05/2014 Echo: Mild MR/AI; b. 05/2019 Echo: Mild Ca2+ of MV.   Obesity (BMI 30-39.9)    Palpitations    a. 02/2021 Zio: Predominantly SR @ 75 (44-167). 8 SVT runs (max 167 bpm, longest 10 beats). ? Atrial tach. Triggered events assoc w/ SVT. Otw rare isolated PACs/PVCs. No afib/flutter.   Raynaud's phenomenon    Rheumatoid arthritis ([place])    S/P lumbar laminectomy 12/09/2017   S/P lumbar spinal fusion 05/26/2018   Sjogren's disease ([place])    Spondylolisthesis of lumbar region    Stroke [facility])    Tobacco abuse    a. ongoing; b. ongoing SOB   Wound infection after surgery  01/11/2018    Social History   Socioeconomic History   Marital status: Married    Spouse name: Not on file   Number of children: Not on file   Years of education: Not on file   Highest education level: Not on file  Occupational History   Not on file  Tobacco Use   Smoking status: Former    Packs/day: 0.50    Years: 10.00    Pack years: 5.00    Types: Cigarettes    Quit date: 07/22/2015    Years since quitting: 6.5   Smokeless tobacco: Never  Vaping Use   Vaping Use: Never used  Substance and Sexual Activity   Alcohol use: No    Alcohol/week: 0.0 standard drinks   Drug use: No   Sexual activity: Not Currently  Other Topics Concern   Not on file  Social History Narrative   Married.   3 children.   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    Social Determinants of Health   Financial Resource Strain: Not on file  Food Insecurity: Not on file  Transportation Needs: Not on file  Physical Activity: Not on file  Stress: Not on file  Social Connections: Not on file  Intimate Partner Violence: Not on file    Past Surgical History:  Procedure Laterality Date   ABDOMINAL HYSTERECTOMY     BACK SURGERY     BONE MARROW BIOPSY  10/2019   CESAREAN SECTION     x3   LUMBAR LAMINECTOMY     LUMBAR LAMINECTOMY/DECOMPRESSION MICRODISCECTOMY N/A 03/29/2017   Procedure: Re operative Right Lumbar Three-Four Laminectomy and discectomy with Left Lumbar Three-Four, Bilateral Lumbar Four-Five Laminectomy for decompression;  Surgeon: Kevan Ny Ditty, MD;  Location: [facility];  Service: Neurosurgery;  Laterality: N/A;   SPINAL CORD STIMULATOR INSERTION  12/09/2017   Procedure: LUMBAR SPINAL CORD STIMULATOR IMPLANT;  Surgeon: Eustace Moore, MD;  Location: [facility];  Service: Neurosurgery;;   SPINAL CORD STIMULATOR REMOVAL N/A 01/11/2018   Procedure: LUMBAR SPINAL CORD STIMULATOR REMOVAL;  Surgeon: Eustace Moore, MD;  Location: [facility];  Service: Neurosurgery;   Laterality: N/A;   TUBAL LIGATION      Family History  Problem Relation Age of Onset   CAD Father 83       CABG x 4   Liver cancer Father 52       passed   Valvular heart disease Father    Hypertension Father    CAD Paternal Aunt        CABG   Breast cancer Paternal Aunt    CAD Paternal Aunt        CABG   Breast cancer Paternal Aunt    CAD Paternal Aunt        CABG  Breast cancer Paternal Aunt    CAD Paternal Aunt        CABG   Breast cancer Paternal Aunt    CAD Paternal Aunt        CABG   Breast cancer Paternal Aunt    CAD Paternal Uncle        MI s/p CABG   Stroke Maternal Grandmother    Stroke Maternal Grandfather    CAD Maternal Grandfather    Heart Problems Brother     Allergies  Allergen Reactions   Pseudoephedrine Hcl Shortness Of Breath and Other (See Comments)    Room starts spinning, can't breath   Ancef [Cefazolin] Other (See Comments)    Unknown   Clindamycin/Lincomycin Hives   Prednisone Other (See Comments)    Chest pain w/ cortisone shots or prednisone pills Elevated blood pressure   Triprolidine Hcl    Adhesive [Tape] Rash and Other (See Comments)    Sensitivity to certain band aids and adhesives - burns skin, makes skin raw   Other Rash and Other (See Comments)    Sensitivity to certain band aids and adhesives - burns skin, makes skin raw   Sudafed [Pseudoephedrine Hcl] Other (See Comments)    Dizziness     Current Outpatient Medications on File Prior to Visit  Medication Sig Dispense Refill   amLODipine (NORVASC) 10 MG tablet Take one tab daily for blood pressure. 90 tablet 1   Blood Glucose Monitoring Suppl ([place]) w/Device KIT Use as instructed to test blood sugar 3 times daily 1 kit 0   buPROPion (WELLBUTRIN XL) 150 MG 24 hr tablet TAKE 1 TABLET BY MOUTH ONCE DAILY FOR ANXIETY AND DEPRESSION. 90 tablet 3   clopidogrel (PLAVIX) 75 MG tablet TAKE 1 TABLET BY MOUTH EVERY DAY 90 tablet 1   Continuous Blood Gluc  Receiver (FREESTYLE LIBRE 14 DAY READER) DEVI 1 Device by Does not apply route in the morning, at noon, and at bedtime. 1 each 0   Continuous Blood Gluc Sensor (FREESTYLE LIBRE 3 SENSOR) MISC 1 Device by Does not apply route continuous. Place 1 sensor on the skin every 14 days. Use to check glucose continuously 6 each 1   cyclobenzaprine (FLEXERIL) 10 MG tablet TAKE 1 TABLET BY MOUTH AT BEDTIME AS NEEDED FOR MUSCLE SPASMS 90 tablet 0   folic acid (FOLVITE) 1 MG tablet Take 2 tablets by mouth daily. 180 tablet 3   glucose blood test strip Use as instructed to blood sugar 3 times daily 300 each 2   hydroxychloroquine (PLAQUENIL) 200 MG tablet Take 1 tablet (200 mg total) by mouth 2 (two) times daily. 180 tablet 0   insulin degludec (TRESIBA FLEXTOUCH) 100 UNIT/ML FlexTouch Pen Inject 45-50 Units into the skin daily.     Insulin Pen Needle (PEN NEEDLES) 31G X 6 MM MISC Use with insulin as directed. 100 each 0   methotrexate (RHEUMATREX) 2.5 MG tablet Take 7 tablets (17.5 mg total) by mouth once a week. Caution:Chemotherapy. Protect from light. 28 tablet 2   pilocarpine (SALAGEN) 5 MG tablet Take 1 tablet (5 mg total) by mouth 2 (two) times daily. 180 tablet 0   rosuvastatin (CRESTOR) 20 MG tablet Take 1 tablet (20 mg total) by mouth daily. For cholesterol 90 tablet 3   Semaglutide,0.25 or 0.5MG/DOS, (OZEMPIC, 0.25 OR 0.5 MG/DOSE,) 2 MG/1.5ML SOPN Inject 0.25 mg once weekly x 4 weeks, then 0.5 mg once weekly thereafter for diabetes. 4.5 mL 0   sertraline (ZOLOFT)  50 MG tablet Take 1 tablet (50 mg total) by mouth daily. For anxiety and depression. 90 tablet 0   spironolactone (ALDACTONE) 25 MG tablet Take 25 mg by mouth daily.     Vitamin D, Ergocalciferol, (DRISDOL) 1.25 MG (50000 UNIT) CAPS capsule Take 1 capsule (50,000 Units total) by mouth 2 (two) times a week. 24 capsule 0   metoprolol succinate (TOPROL-XL) 50 MG 24 hr tablet Take 1 tablet (50 mg total) by mouth daily. For blood pressure 90 tablet 3    telmisartan-hydrochlorothiazide (MICARDIS HCT) 80-25 MG tablet Take by mouth.     No current facility-administered medications on file prior to visit.    BP 126/80    Pulse (!) 102    Temp 98.6 F (37 C) (Temporal)    Ht 5' 6"  (1.676 m)    Wt 208 lb (94.3 kg)    SpO2 99%    BMI 33.57 kg/m  Objective:   Physical Exam Cardiovascular:     Rate and Rhythm: Normal rate and regular rhythm.  Pulmonary:     Effort: Pulmonary effort is normal.     Breath sounds: Normal breath sounds.  Musculoskeletal:     Cervical back: Neck supple.  Skin:    General: Skin is warm and dry.          Assessment & Plan:      This visit occurred during the SARS-CoV-2 public health emergency.  Safety protocols were in place, including screening questions prior to the visit, additional usage of staff PPE, and extensive cleaning of exam room while observing appropriate contact time as indicated for disinfecting solutions.

## 2022-02-12 ENCOUNTER — Telehealth: Payer: Self-pay

## 2022-02-12 NOTE — Telephone Encounter (Signed)
Left voice mail message requesting to have patient call back to see if she mailed the ZIO monitor back so we could get the results before her upcoming appointment with Dr. Garen Lah on 02/19/22

## 2022-02-16 DIAGNOSIS — M170 Bilateral primary osteoarthritis of knee: Secondary | ICD-10-CM

## 2022-02-16 DIAGNOSIS — M545 Low back pain, unspecified: Secondary | ICD-10-CM

## 2022-02-16 DIAGNOSIS — M797 Fibromyalgia: Secondary | ICD-10-CM

## 2022-02-16 DIAGNOSIS — M19042 Primary osteoarthritis, left hand: Secondary | ICD-10-CM

## 2022-02-16 DIAGNOSIS — M25552 Pain in left hip: Secondary | ICD-10-CM

## 2022-02-16 DIAGNOSIS — G8929 Other chronic pain: Secondary | ICD-10-CM

## 2022-02-16 DIAGNOSIS — M19041 Primary osteoarthritis, right hand: Secondary | ICD-10-CM

## 2022-02-18 ENCOUNTER — Encounter: Payer: Self-pay | Admitting: Cardiology

## 2022-02-18 ENCOUNTER — Telehealth: Payer: Self-pay

## 2022-02-18 NOTE — Telephone Encounter (Signed)
Received the following MyChart message from patient and replied to her as follows:  Hi Falen,   Thank you for letting us know. I would wear the monitor as soon as you are able to and please let me know when you put it on so I can reschedule your appointment accordingly.  Thank you!  Ashlen Kiger RN   ===View-only below this line===   ----- Message -----      From:Lauren Winters      Sent:02/18/2022 10:19 AM EST        JJ:[id number] Agbor-Etang, MD   Subject:Appointment and heart monitor   I need to cancel my appointment for tomorrow (Friday). I have had some problems that has caused me not to be able to wear the heart monitor yet. I have a few more tests to be done, after that should I wear the monitor or just wait? Thank you, Lauren Winters

## 2022-02-18 NOTE — Telephone Encounter (Signed)
Call attempted to speak with patient about status of ZIO monitor. (Pending return in [place]) Left voice mail message requesting for patient to call back to discuss. F/U appointment scheduled tomorrow with Dr. Garen Lah. Message sent to scheduling and Kavin Leech, RN as well.

## 2022-02-19 ENCOUNTER — Encounter: Payer: Self-pay | Admitting: *Deleted

## 2022-02-19 ENCOUNTER — Ambulatory Visit: Payer: Medicare Other | Admitting: Cardiology

## 2022-02-22 ENCOUNTER — Telehealth: Payer: Medicare Other

## 2022-02-22 DIAGNOSIS — I10 Essential (primary) hypertension: Secondary | ICD-10-CM | POA: Diagnosis not present

## 2022-02-22 DIAGNOSIS — N182 Chronic kidney disease, stage 2 (mild): Secondary | ICD-10-CM | POA: Diagnosis not present

## 2022-02-23 DIAGNOSIS — M5134 Other intervertebral disc degeneration, thoracic region: Secondary | ICD-10-CM | POA: Diagnosis not present

## 2022-02-23 DIAGNOSIS — M4724 Other spondylosis with radiculopathy, thoracic region: Secondary | ICD-10-CM | POA: Diagnosis not present

## 2022-02-23 DIAGNOSIS — M5416 Radiculopathy, lumbar region: Secondary | ICD-10-CM | POA: Diagnosis not present

## 2022-02-23 DIAGNOSIS — M5116 Intervertebral disc disorders with radiculopathy, lumbar region: Secondary | ICD-10-CM | POA: Diagnosis not present

## 2022-02-23 DIAGNOSIS — M419 Scoliosis, unspecified: Secondary | ICD-10-CM | POA: Diagnosis not present

## 2022-02-23 DIAGNOSIS — M4316 Spondylolisthesis, lumbar region: Secondary | ICD-10-CM | POA: Diagnosis not present

## 2022-02-23 DIAGNOSIS — Z981 Arthrodesis status: Secondary | ICD-10-CM | POA: Diagnosis not present

## 2022-02-23 DIAGNOSIS — M160 Bilateral primary osteoarthritis of hip: Secondary | ICD-10-CM | POA: Diagnosis not present

## 2022-02-23 DIAGNOSIS — M1611 Unilateral primary osteoarthritis, right hip: Secondary | ICD-10-CM | POA: Diagnosis not present

## 2022-02-23 DIAGNOSIS — M4186 Other forms of scoliosis, lumbar region: Secondary | ICD-10-CM | POA: Diagnosis not present

## 2022-02-23 DIAGNOSIS — Z4789 Encounter for other orthopedic aftercare: Secondary | ICD-10-CM | POA: Diagnosis not present

## 2022-02-23 DIAGNOSIS — M545 Low back pain, unspecified: Secondary | ICD-10-CM | POA: Diagnosis not present

## 2022-02-23 DIAGNOSIS — M4726 Other spondylosis with radiculopathy, lumbar region: Secondary | ICD-10-CM | POA: Diagnosis not present

## 2022-02-24 ENCOUNTER — Encounter: Payer: Self-pay | Admitting: Cardiology

## 2022-02-25 ENCOUNTER — Telehealth: Payer: Self-pay | Admitting: Cardiology

## 2022-02-25 NOTE — Telephone Encounter (Signed)
? ?  Pre-operative Risk Assessment  ?  ?Patient Name: Lauren Winters  ?DOB: 1966/03/12 ?MRN: [id number]  ? ?  ? ?Request for Surgical Clearance   ? ?Procedure:   epidural steroid injection  ? ?Date of Surgery:  Clearance TBD                              ?   ?Surgeon:  not indicated  ?Surgeon's Group or Practice Name:  [facility] ?Phone number:  [phone] ?Fax number:  [phone] ?  ?Type of Clearance Requested:   ?- Pharmacy:  Hold Clopidogrel (Plavix) hold 7 days  ?  ?Type of Anesthesia:  Not Indicated ?  ?Additional requests/questions:   ? ?Signed, ?Caryl Pina Gerringer   ?02/25/2022, 4:21 PM  ? ?

## 2022-02-25 NOTE — Telephone Encounter (Signed)
? ?  Primary Cardiologist: [name] Sable, MD ? ?Chart reviewed as part of pre-operative protocol coverage. Given past medical history and time since last visit, based on ACC/AHA guidelines, Lauren Winters would be at acceptable risk for the planned procedure without further cardiovascular testing.  ? ?Patient's Plavix is prescribed by her PCP for noncardiac reasons.  Recommendations for holding her Plavix will need to come from the prescribing provider. ? ?I will route this recommendation to the requesting party via Epic fax function and remove from pre-op pool. ? ?Please call with questions. ? ?Jossie Ng. James Senn NP-C ? ?  ?02/25/2022, 4:51 PM ?[place] ?[address] ?Office [phone] Fax [phone] ? ? ? ? ?

## 2022-03-01 DIAGNOSIS — E1165 Type 2 diabetes mellitus with hyperglycemia: Secondary | ICD-10-CM | POA: Diagnosis not present

## 2022-03-01 MED ORDER — METHOTREXATE 2.5 MG PO TABS: 17.5000 mg | ORAL_TABLET | ORAL | 2 refills | Status: DC

## 2022-03-01 NOTE — Telephone Encounter (Signed)
Next Visit: 04/13/2022 ? ?Last Visit: 01/13/2022 ? ?Last Fill: 08/21/2021 ? ?DX: Other organ or system involvement in systemic lupus erythematosus  ? ?Current Dose per office note 01/12/2022: methotrexate 7 tablets by mouth once weekly, folic acid '2mg'$  po qd ? ?Labs: 01/12/2022 CBC and CMP are stable.   ? ?Okay to refill MTX?  ?

## 2022-03-02 DIAGNOSIS — Z9889 Other specified postprocedural states: Secondary | ICD-10-CM | POA: Diagnosis not present

## 2022-03-02 DIAGNOSIS — M5116 Intervertebral disc disorders with radiculopathy, lumbar region: Secondary | ICD-10-CM | POA: Diagnosis not present

## 2022-03-02 DIAGNOSIS — Z981 Arthrodesis status: Secondary | ICD-10-CM | POA: Diagnosis not present

## 2022-03-02 DIAGNOSIS — M48061 Spinal stenosis, lumbar region without neurogenic claudication: Secondary | ICD-10-CM | POA: Diagnosis not present

## 2022-03-02 DIAGNOSIS — M4726 Other spondylosis with radiculopathy, lumbar region: Secondary | ICD-10-CM | POA: Diagnosis not present

## 2022-03-09 DIAGNOSIS — M5416 Radiculopathy, lumbar region: Secondary | ICD-10-CM | POA: Diagnosis not present

## 2022-03-09 DIAGNOSIS — M5417 Radiculopathy, lumbosacral region: Secondary | ICD-10-CM | POA: Diagnosis not present

## 2022-03-09 DIAGNOSIS — Z981 Arthrodesis status: Secondary | ICD-10-CM | POA: Diagnosis not present

## 2022-03-09 DIAGNOSIS — M25551 Pain in right hip: Secondary | ICD-10-CM | POA: Diagnosis not present

## 2022-03-09 DIAGNOSIS — Z7902 Long term (current) use of antithrombotics/antiplatelets: Secondary | ICD-10-CM | POA: Diagnosis not present

## 2022-03-26 ENCOUNTER — Encounter: Payer: Self-pay | Admitting: Primary Care

## 2022-03-26 ENCOUNTER — Ambulatory Visit (INDEPENDENT_AMBULATORY_CARE_PROVIDER_SITE_OTHER): Payer: Medicare Other | Admitting: Primary Care

## 2022-03-26 VITALS — BP 134/80 | HR 85 | Wt 204.0 lb

## 2022-03-26 DIAGNOSIS — E1165 Type 2 diabetes mellitus with hyperglycemia: Secondary | ICD-10-CM

## 2022-03-26 DIAGNOSIS — F411 Generalized anxiety disorder: Secondary | ICD-10-CM

## 2022-03-26 DIAGNOSIS — F331 Major depressive disorder, recurrent, moderate: Secondary | ICD-10-CM

## 2022-03-26 DIAGNOSIS — Z23 Encounter for immunization: Secondary | ICD-10-CM | POA: Diagnosis not present

## 2022-03-26 DIAGNOSIS — Z794 Long term (current) use of insulin: Secondary | ICD-10-CM

## 2022-03-26 LAB — POCT GLYCOSYLATED HEMOGLOBIN (HGB A1C): Hemoglobin A1C: 6.2 % — AB (ref 4.0–5.6)

## 2022-03-26 MED ORDER — ESCITALOPRAM OXALATE 10 MG PO TABS: 10.0000 mg | ORAL_TABLET | Freq: Every day | ORAL | 0 refills | Status: DC

## 2022-03-26 NOTE — Assessment & Plan Note (Signed)
Overall controlled, however anxiety uncontrolled.  Intolerant to sertraline given diarrhea. ? ?Start Lexapro 10 mg daily. ?Continue bupropion XL 150 mg daily. ? ?She will update in 4 to 6 weeks ?

## 2022-03-26 NOTE — Patient Instructions (Addendum)
Continue Tresiba 45 units in the morning, Ozempic 1 mg weekly. ? ?Keep up the great work with your diet! ? ?Start escitalopram (Lexapro) 10 mg daily for anxiety and depression. ?Continue bupropion XL 150 mg daily for depression. ? ?Please update me regarding your anxiety/depression symptoms in about 6 weeks.  Notify me sooner if you experience the diarrhea. ? ?Please schedule a follow up visit for 3 months for a diabetes check. ? ?It was a pleasure to see you today! ? ?

## 2022-03-26 NOTE — Progress Notes (Signed)
? ?Subjective:  ? ? Patient ID: Lauren Winters, female    DOB: [birth year], 56 y.o.   MRN: [id number] ? ?HPI ? ?Lauren Winters is a very pleasant 56 y.o. female with a history of type 2 diabetes, hypertension, CVA, chronic back pain, Sjogren's syndrome, lupus, fibromyalgia, chronic hip pain, CKD, who presents today for follow-up of type 2 diabetes and anxiety/depression. She is also due for her Shingrix vaccine.  ? ?1) Type 2 Diabetes: ? ?Current medications include: Ozempic 1 mg weekly, Tresiba 45 units every morning.  ? ?She is checking her blood glucose multiple times daily and is getting readings ranging:  ? ?2 hours after meals: 130's ?AM fasting: low 100's  ? ?Lowest reading: 98 ?Highest reading: 194, forgot insulin that day ? ?Last A1C: 10.28 November 2021, 6.2 today ?Last Eye Exam: Due ?Last Foot Exam: Due ?Pneumonia Vaccination: ?Urine Microalbumin: None.  Managed on ARB ?Statin: Rosuvastatin ? ?Dietary changes since last visit: Reduced portion sizes, decreased appetite. Has cut out sweets and chips. She has increased intake of protein.  ? ? ?Exercise: None.  ? ?BP Readings from Last 3 Encounters:  ?03/26/22 134/80  ?02/10/22 126/80  ?01/13/22 110/60  ? ?2) Anxiety/Depression: Chronic. Currently prescribed sertraline 75 mg and bupropion XL 150 mg. During her last visit we increased her dose of sertraline to 75 mg given continued anxiety symptoms. As soon as she increased her dose she experienced relentless diarrhea which did not stop until she stopped taking sertraline all together. ? ?She's been off of sertraline for 10 days, diarrhea stopped within 48 hours. She's felt more panic, worrying, feeling more anxious. She would like to try Lexapro again, she thinks this was effective.  ? ?Review of Systems  ?Eyes:  Negative for visual disturbance.  ?Respiratory:  Negative for shortness of breath.   ?Musculoskeletal:  Positive for arthralgias.  ?Neurological:  Positive for numbness. Negative for dizziness.  ? ?    ? ? ?Past Medical History:  ?Diagnosis Date  ? Anxiety   ? Atypical chest pain   ? a. ETT 05/2014: normal stress test without evidence of myocardial ischemia or chest pain at peak stress; b. 02/2021 Cor CTA: Ca2+ score = 0. Nl cors.  ? Complication of anesthesia   ? migraine headache  ? Depression   ? Diabetes mellitus without complication ([place]) metformin  ? dx 2015  ? Dyspnea   ? on exertion  ? Fibromyalgia   ? Headache   ? Heart murmur   ? a. told back in 2007, but no mention with 2016 cardiac w/u; b. 02/2019 Echo: EF 60-65%, conc LVH. Nl RV fxn. Mild Ca2+ of MV. Triv AI.  ? Hyperlipidemia   ? Hypertension   ? Lupus (systemic lupus erythematosus) ([place])   ? Mitral regurgitation   ? a. 05/2014 Echo: Mild MR/AI; b. 05/2019 Echo: Mild Ca2+ of MV.  ? Obesity (BMI 30-39.9)   ? Palpitations   ? a. 02/2021 Zio: Predominantly SR @ 75 (44-167). 8 SVT runs (max 167 bpm, longest 10 beats). ? Atrial tach. Triggered events assoc w/ SVT. Otw rare isolated PACs/PVCs. No afib/flutter.  ? Raynaud's phenomenon   ? Rheumatoid arthritis ([place])   ? S/P lumbar laminectomy 12/09/2017  ? S/P lumbar spinal fusion 05/26/2018  ? Sjogren's disease ([place])   ? Spondylolisthesis of lumbar region   ? Stroke [facility])   ? Tobacco abuse   ? a. ongoing; b. ongoing SOB  ? Wound infection after surgery 01/11/2018  ? ? ?  Social History  ? ?Socioeconomic History  ? Marital status: Married  ?  Spouse name: Not on file  ? Number of children: Not on file  ? Years of education: Not on file  ? Highest education level: Not on file  ?Occupational History  ? Not on file  ?Tobacco Use  ? Smoking status: Former  ?  Packs/day: 0.50  ?  Years: 10.00  ?  Pack years: 5.00  ?  Types: Cigarettes  ?  Quit date: 07/22/2015  ?  Years since quitting: 6.6  ? Smokeless tobacco: Never  ?Vaping Use  ? Vaping Use: Never used  ?Substance and Sexual Activity  ? Alcohol use: No  ?  Alcohol/week: 0.0 standard drinks  ? Drug use: No  ? Sexual activity: Not Currently  ?Other Topics Concern  ?  Not on file  ?Social History Narrative  ? Married.  ? 3 children.  ? 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   ? ?Social Determinants of Health  ? ?Financial Resource Strain: Not on file  ?Food Insecurity: Not on file  ?Transportation Needs: Not on file  ?Physical Activity: Not on file  ?Stress: Not on file  ?Social Connections: Not on file  ?Intimate Partner Violence: Not on file  ? ? ?Past Surgical History:  ?Procedure Laterality Date  ? ABDOMINAL HYSTERECTOMY    ? BACK SURGERY    ? BONE MARROW BIOPSY  10/2019  ? CESAREAN SECTION    ? x3  ? LUMBAR LAMINECTOMY    ? LUMBAR LAMINECTOMY/DECOMPRESSION MICRODISCECTOMY N/A 03/29/2017  ? Procedure: Re operative Right Lumbar Three-Four Laminectomy and discectomy with Left Lumbar Three-Four, Bilateral Lumbar Four-Five Laminectomy for decompression;  Surgeon: Kevan Ny Ditty, MD;  Location: [facility];  Service: Neurosurgery;  Laterality: N/A;  ? SPINAL CORD STIMULATOR INSERTION  12/09/2017  ? Procedure: LUMBAR SPINAL CORD STIMULATOR IMPLANT;  Surgeon: Eustace Moore, MD;  Location: [facility];  Service: Neurosurgery;;  ? SPINAL CORD STIMULATOR REMOVAL N/A 01/11/2018  ? Procedure: LUMBAR SPINAL CORD STIMULATOR REMOVAL;  Surgeon: Eustace Moore, MD;  Location: [facility];  Service: Neurosurgery;  Laterality: N/A;  ? TUBAL LIGATION    ? ? ?Family History  ?Problem Relation Age of Onset  ? CAD Father 62  ?     CABG x 4  ? Liver cancer Father 70  ?     passed  ? Valvular heart disease Father   ? Hypertension Father   ? CAD Paternal Aunt   ?     CABG  ? Breast cancer Paternal Aunt   ? CAD Paternal Aunt   ?     CABG  ? Breast cancer Paternal Aunt   ? CAD Paternal Aunt   ?     CABG  ? Breast cancer Paternal Aunt   ? CAD Paternal Aunt   ?     CABG  ? Breast cancer Paternal Aunt   ? CAD Paternal Aunt   ?     CABG  ? Breast cancer Paternal Aunt   ? CAD Paternal Uncle   ?     MI s/p CABG  ? Stroke Maternal Grandmother   ? Stroke Maternal Grandfather   ? CAD  Maternal Grandfather   ? Heart Problems Brother   ? ? ?Allergies  ?Allergen Reactions  ? Pseudoephedrine Hcl Shortness Of Breath and Other (See Comments)  ?  Room starts spinning, can't breath  ? Ancef [Cefazolin] Other (See Comments)  ?  Unknown  ? Clindamycin/Lincomycin Hives  ? Prednisone Other (See Comments)  ?  Chest pain w/ cortisone shots or prednisone pills ?Elevated blood pressure  ? Triprolidine Hcl   ? Adhesive [Tape] Rash and Other (See Comments)  ?  Sensitivity to certain band aids and adhesives - burns skin, makes skin raw  ? Other Rash and Other (See Comments)  ?  Sensitivity to certain band aids and adhesives - burns skin, makes skin raw  ? Sudafed [Pseudoephedrine Hcl] Other (See Comments)  ?  Dizziness   ? ? ?Current Outpatient Medications on File Prior to Visit  ?Medication Sig Dispense Refill  ? amLODipine (NORVASC) 10 MG tablet Take one tab daily for blood pressure. 90 tablet 1  ? Blood Glucose Monitoring Suppl ([place]) w/Device KIT Use as instructed to test blood sugar 3 times daily 1 kit 0  ? buPROPion (WELLBUTRIN XL) 150 MG 24 hr tablet TAKE 1 TABLET BY MOUTH ONCE DAILY FOR ANXIETY AND DEPRESSION. 90 tablet 3  ? clopidogrel (PLAVIX) 75 MG tablet TAKE 1 TABLET BY MOUTH EVERY DAY 90 tablet 1  ? Continuous Blood Gluc Receiver (FREESTYLE LIBRE 14 DAY READER) DEVI 1 Device by Does not apply route in the morning, at noon, and at bedtime. 1 each 0  ? Continuous Blood Gluc Sensor (FREESTYLE LIBRE 3 SENSOR) MISC 1 Device by Does not apply route continuous. Place 1 sensor on the skin every 14 days. Use to check glucose continuously 6 each 1  ? cyclobenzaprine (FLEXERIL) 10 MG tablet TAKE 1 TABLET BY MOUTH AT BEDTIME AS NEEDED FOR MUSCLE SPASMS 90 tablet 0  ? folic acid (FOLVITE) 1 MG tablet Take 2 tablets by mouth daily. 180 tablet 3  ? glucose blood test strip Use as instructed to blood sugar 3 times daily 300 each 2  ? hydroxychloroquine (PLAQUENIL) 200 MG tablet Take 1 tablet  (200 mg total) by mouth 2 (two) times daily. 180 tablet 0  ? insulin degludec (TRESIBA FLEXTOUCH) 100 UNIT/ML FlexTouch Pen Inject 45-50 Units into the skin daily.    ? Insulin Pen Needle (PEN NEEDLES) 31G X 6

## 2022-03-26 NOTE — Assessment & Plan Note (Addendum)
Controlled and significant improvement with A1C today of 6.2!!! ? ?Continue Tresiba 45 units every morning and Ozempic 1 mg weekly.  ? ?Discussed for her to notify me if she notices hypoglycemia with readings consistently below 80. ? ?Foot exam today with moderate loss of sensation.  ?Eye exam due, discussed this with patient today. ?Managed on statin and ARB. ? ?Follow up in 3 months. ?

## 2022-03-26 NOTE — Assessment & Plan Note (Signed)
Uncntrolled since off of sertraline, intolerant to sertraline with diarrhea. ? ?She believes that she did well on Lexapro previously.  Prescription for Lexapro 10 mg sent to pharmacy. ? ?Continue bupropion XL 150 mg daily. ? ?She will update in 4 to 6 weeks. ?

## 2022-03-30 ENCOUNTER — Other Ambulatory Visit: Payer: Self-pay | Admitting: Physician Assistant

## 2022-03-31 DIAGNOSIS — E1165 Type 2 diabetes mellitus with hyperglycemia: Secondary | ICD-10-CM | POA: Diagnosis not present

## 2022-03-31 NOTE — Progress Notes (Signed)
? ?Office Visit Note ? ?Patient: Lauren Winters             ?Date of Birth: 1966/02/25           ?MRN: [id number]             ?PCP: Pleas Koch, NP ?Referring: Pleas Koch, NP ?Visit Date: 04/13/2022 ?Occupation: '@GUAROCC'$ @ ? ?Subjective:  ?Low back pain  ? ?History of Present Illness: Lauren Winters is a 56 y.o. female with history of systemic lupus erythematosus, osteoarthritis, and fibromyalgia.  She is taking Plaquenil 200 mg 1 tablet by mouth twice daily, methotrexate 7 tablets by mouth once weekly, and folic acid '2mg'$  po qd.   She continues to tolerate combination therapy without any side effects and has not missed any doses recently.  She denies any signs or symptoms of a systemic lupus flare since her last office visit.  She has not had any recent rashes or increased photosensitivity.  She has been trying to avoid direct sun exposure.  She experiences intermittent oral ulcerations but denies any nasal ulcerations.  She has ongoing sicca symptoms and has been using eyedrops for symptomatic relief.  She has not been taking pilocarpine recently since her insurance will no longer cover the prescription.  She continues to experience intermittent symptoms of Raynaud's but denies any digital ulcerations.  She has some shortness on her breath which she attributes to deconditioning. She denies any pleuritic chest pain or palpations.  ?She continues to have ongoing discomfort in her lower back with right-sided radiculopathy.  She has limited mobility due to severity of pain and stiffness in her lower back.  She has an upcoming appointment with neurosurgery in 2 weeks to discuss proceeding with surgery in the future.  She continues to use a cane to assist with ambulation. ?She states that she has been experiencing increased pain in both hands.  She has ongoing symptoms of neuropathy in both ends especially in the left hand.  She denies any joint swelling at this time. ? ?Activities of Daily Living:   ?Patient reports morning stiffness for 6 hours.   ?Patient Reports nocturnal pain.  ?Difficulty dressing/grooming: Reports ?Difficulty climbing stairs: Reports ?Difficulty getting out of chair: Reports ?Difficulty using hands for taps, buttons, cutlery, and/or writing: Reports ? ?Review of Systems  ?Constitutional:  Positive for fatigue.  ?HENT:  Positive for mouth dryness. Negative for mouth sores and nose dryness.   ?Eyes:  Positive for dryness. Negative for pain and visual disturbance.  ?Respiratory:  Positive for shortness of breath. Negative for cough, hemoptysis and difficulty breathing.   ?Cardiovascular:  Negative for chest pain, palpitations, hypertension and swelling in legs/feet.  ?Gastrointestinal:  Negative for blood in stool, constipation and diarrhea.  ?Endocrine: Negative for increased urination.  ?Genitourinary:  Negative for painful urination.  ?Musculoskeletal:  Positive for joint pain, gait problem, joint pain, joint swelling, muscle weakness and morning stiffness. Negative for myalgias, muscle tenderness and myalgias.  ?Skin:  Negative for color change, pallor, rash, hair loss, nodules/bumps, skin tightness, ulcers and sensitivity to sunlight.  ?Allergic/Immunologic: Negative for susceptible to infections.  ?Neurological:  Positive for numbness and weakness. Negative for dizziness and headaches.  ?Hematological:  Positive for bruising/bleeding tendency. Negative for swollen glands.  ?Psychiatric/Behavioral:  Positive for sleep disturbance. Negative for depressed mood. The patient is not nervous/anxious.   ? ?PMFS History:  ?Patient Active Problem List  ? Diagnosis Date Noted  ? Type 2 diabetes mellitus with hyperglycemia, with long-term current  use of insulin ([place]) 12/08/2021  ? Persistent cough for 3 weeks or longer 12/08/2021  ? Encounter for pain management counseling 07/25/2021  ? History of infection of neurostimulator battery w/ removal of device 06/12/2021  ?  Class: History of  ?  Elevated C-reactive protein (CRP) 06/12/2021  ? Elevated sed rate 06/12/2021  ? DDD (degenerative disc disease), thoracic 06/12/2021  ? DDD (degenerative disc disease), cervical 06/12/2021  ? Osteoarthritis of hip (Right) 06/12/2021  ? Other idiopathic scoliosis, thoracolumbar region 06/12/2021  ? Fixation hardware in spine (L3-4 and L4-5) 06/12/2021  ? Chronic pain syndrome 06/10/2021  ? Pharmacologic therapy 06/10/2021  ? Disorder of skeletal system 06/10/2021  ? Problems influencing health status 06/10/2021  ? Vitamin D deficiency 06/10/2021  ? Positive ANA (antinuclear antibody) 06/10/2021  ? Elevated rheumatoid factor 06/10/2021  ? Elevated d-dimer 06/10/2021  ? Chronic anticoagulation (Plavix) 06/10/2021  ? Chronic lower extremity pain (3ry area of Pain) (Right) 06/10/2021  ? Diabetic peripheral neuropathy (4th area of Pain) ([place]) 06/10/2021  ? Chronic, intermittent, rib pain (8th area of Pain) (Bilateral) 06/10/2021  ? Thoracic radiculitis (T6) (intermittent) (Bilateral) 06/10/2021  ? Chronic neck pain (6th area of Pain) (posterior) (Midline) (R>L) 06/10/2021  ? Cervicalgia 06/10/2021  ? Chronic upper extremity pain (7th area of Pain) (Right) 06/10/2021  ? Cough 01/14/2021  ? Adenopathy, cervical 08/18/2020  ? Monoclonal gammopathy 05/14/2020  ? Bilateral primary osteoarthritis of hip 10/19/2019  ? Paresthesias 03/02/2019  ? Chronic hand pain (Bilateral) 12/06/2018  ? Headache disorder 12/06/2018  ? Left arm weakness 10/11/2018  ? Numbness and tingling in left arm 10/11/2018  ? Essential hypertension 10/06/2018  ? 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[place]) 10/06/2018  ? Stage 3 chronic kidney disease ([place]) 10/06/2018  ? History of stroke 08/19/2018  ? History of lumbar spinal fusion (L3-4, L4-5) 05/26/2018  ? Local infection of wound 02/07/2018  ? High risk medication use 02/03/2018  ? Trochanteric bursitis 06/09/2017  ? Lumbar post-laminectomy syndrome (L3-4, L4-5) 06/09/2017  ? Rheumatoid  arthritis involving multiple sites with positive rheumatoid factor ([place]) 06/09/2017  ? HNP (herniated nucleus pulposus), lumbar 03/29/2017  ? 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 Chronic shoulder pain (Right) 02/18/2017  ? Herniation of nucleus pulposus of cervical intervertebral disc without myelopathy 02/09/2017  ? Lumbosacral spondylosis with radiculopathy 01/21/2017  ? Primary osteoarthritis of both hands 12/22/2016  ? Primary osteoarthritis of both knees 12/22/2016  ? Spondylosis of lumbar region without myelopathy or radiculopathy 12/22/2016  ? Fibromyalgia 12/22/2016  ? Preventative health care 04/19/2016  ? Chronic hip pain (2ry area of Pain) (Bilateral) 03/09/2016  ? Disorder of sacroiliac joint 02/23/2016  ? Chronic low back pain (1ry area of Pain) (Bilateral) (R>L) w/o sciatica 02/04/2016  ? Chronic mid back pain (5th area of Pain) (Bilateral) 12/17/2015  ? Displacement of lumbar intervertebral disc without myelopathy 10/29/2015  ? Hyperlipidemia 08/29/2015  ? Lupus ([place]) 08/04/2015  ? Rheumatoid factor positive 08/04/2015  ? Sjogren's syndrome ([place]) 08/04/2015  ? Mitral regurgitation   ? Atypical chest pain   ? Obesity (BMI 30-39.9)   ? GAD (generalized anxiety disorder)   ? Depression 05/23/2014  ? Chest pain 05/23/2014  ?  ?Past Medical History:  ?Diagnosis Date  ? Anxiety   ? Atypical chest pain   ? a. ETT 05/2014: normal stress test without evidence of myocardial ischemia or chest pain at peak stress; b. 02/2021 Cor CTA: Ca2+ score = 0. Nl cors.  ? Complication of anesthesia   ?  migraine headache  ? Depression   ? Diabetes mellitus without complication ([place]) metformin  ? dx 2015  ? Dyspnea   ? on exertion  ? Fibromyalgia   ? Headache   ? Heart murmur   ? a. told back in 2007, but no mention with 2016 cardiac w/u; b. 02/2019 Echo: EF 60-65%, conc LVH. Nl RV fxn. Mild Ca2+ of MV. Triv AI.  ? Hyperlipidemia   ? Hypertension   ? Lumbar radiculopathy   ? Lupus (systemic lupus erythematosus) ([place])   ?  Mitral regurgitation   ? a. 05/2014 Echo: Mild MR/AI; b. 05/2019 Echo: Mild Ca2+ of MV.  ? Obesity (BMI 30-39.9)   ? Palpitations   ? a. 02/2021 Zio: Predominantly SR @ 75 (44-167). 8 SVT runs (max 167 bpm, longest 10 beat

## 2022-04-09 DIAGNOSIS — M5416 Radiculopathy, lumbar region: Secondary | ICD-10-CM | POA: Diagnosis not present

## 2022-04-13 ENCOUNTER — Ambulatory Visit: Payer: Medicare Other | Admitting: Physician Assistant

## 2022-04-13 ENCOUNTER — Encounter: Payer: Self-pay | Admitting: Physician Assistant

## 2022-04-13 VITALS — BP 108/69 | HR 75 | Resp 15 | Ht 66.0 in | Wt 203.4 lb

## 2022-04-13 DIAGNOSIS — Z8673 Personal history of transient ischemic attack (TIA), and cerebral infarction without residual deficits: Secondary | ICD-10-CM | POA: Diagnosis not present

## 2022-04-13 DIAGNOSIS — I7300 Raynaud's syndrome without gangrene: Secondary | ICD-10-CM | POA: Diagnosis not present

## 2022-04-13 DIAGNOSIS — M7061 Trochanteric bursitis, right hip: Secondary | ICD-10-CM | POA: Diagnosis not present

## 2022-04-13 DIAGNOSIS — Z79899 Other long term (current) drug therapy: Secondary | ICD-10-CM | POA: Diagnosis not present

## 2022-04-13 DIAGNOSIS — I10 Essential (primary) hypertension: Secondary | ICD-10-CM

## 2022-04-13 DIAGNOSIS — E559 Vitamin D deficiency, unspecified: Secondary | ICD-10-CM

## 2022-04-13 DIAGNOSIS — Z9889 Other specified postprocedural states: Secondary | ICD-10-CM

## 2022-04-13 DIAGNOSIS — Z87891 Personal history of nicotine dependence: Secondary | ICD-10-CM

## 2022-04-13 DIAGNOSIS — M19042 Primary osteoarthritis, left hand: Secondary | ICD-10-CM

## 2022-04-13 DIAGNOSIS — E119 Type 2 diabetes mellitus without complications: Secondary | ICD-10-CM | POA: Diagnosis not present

## 2022-04-13 DIAGNOSIS — M19041 Primary osteoarthritis, right hand: Secondary | ICD-10-CM | POA: Diagnosis not present

## 2022-04-13 DIAGNOSIS — M3219 Other organ or system involvement in systemic lupus erythematosus: Secondary | ICD-10-CM | POA: Diagnosis not present

## 2022-04-13 DIAGNOSIS — M170 Bilateral primary osteoarthritis of knee: Secondary | ICD-10-CM | POA: Diagnosis not present

## 2022-04-13 DIAGNOSIS — M797 Fibromyalgia: Secondary | ICD-10-CM

## 2022-04-13 DIAGNOSIS — I340 Nonrheumatic mitral (valve) insufficiency: Secondary | ICD-10-CM

## 2022-04-13 DIAGNOSIS — M7062 Trochanteric bursitis, left hip: Secondary | ICD-10-CM

## 2022-04-13 DIAGNOSIS — Z13820 Encounter for screening for osteoporosis: Secondary | ICD-10-CM

## 2022-04-13 MED ORDER — PILOCARPINE HCL 5 MG PO TABS: 5.0000 mg | ORAL_TABLET | Freq: Two times a day (BID) | ORAL | 0 refills | Status: DC

## 2022-04-13 NOTE — Patient Instructions (Signed)
Standing Labs ?We placed an order today for your standing lab work.  ? ?Please have your standing labs drawn in July and every 3 months  ? ?If possible, please have your labs drawn 2 weeks prior to your appointment so that the provider can discuss your results at your appointment. ? ?Please note that you may see your imaging and lab results in MyChart before we have reviewed them. ?We may be awaiting multiple results to interpret others before contacting you. ?Please allow our office up to 72 hours to thoroughly review all of the results before contacting the office for clarification of your results. ? ?We have open lab daily: ?Monday through Thursday from 1:30-4:30 PM and Friday from 1:30-4:00 PM ?at the office of Dr. Shaili Deveshwar, [facility].   ?Please be advised, all patients with office appointments requiring lab work will take precedent over walk-in lab work.  ?If possible, please come for your lab work on Monday and Friday afternoons, as you may experience shorter wait times. ?The office is located at [address], [address], [place], [place] [address] ?No appointment is necessary.   ?Labs are drawn by [facility]. Please bring your co-pay at the time of your lab draw.  You may receive a bill from [facility] for your lab work. ? ?Please note if you are on Hydroxychloroquine and and an order has been placed for a Hydroxychloroquine level, you will need to have it drawn 4 hours or more after your last dose. ? ?If you wish to have your labs drawn at another location, please call the office 24 hours in advance to send orders. ? ?If you have any questions regarding directions or hours of operation,  ?please call [phone].   ?As a reminder, please drink plenty of water prior to coming for your lab work. Thanks! ? ?

## 2022-04-14 LAB — CBC WITH DIFFERENTIAL/PLATELET
Absolute Monocytes: 430 cells/uL (ref 200–950)
Basophils Absolute: 69 cells/uL (ref 0–200)
Basophils Relative: 0.8 %
Eosinophils Absolute: 112 cells/uL (ref 15–500)
Eosinophils Relative: 1.3 %
HCT: 41.3 % (ref 35.0–45.0)
Hemoglobin: 14.1 g/dL (ref 11.7–15.5)
Lymphs Abs: 2623 cells/uL (ref 850–3900)
MCH: 28.8 pg (ref 27.0–33.0)
MCHC: 34.1 g/dL (ref 32.0–36.0)
MCV: 84.3 fL (ref 80.0–100.0)
MPV: 8.9 fL (ref 7.5–12.5)
Monocytes Relative: 5 %
Neutro Abs: 5366 cells/uL (ref 1500–7800)
Neutrophils Relative %: 62.4 %
Platelets: 444 10*3/uL — ABNORMAL HIGH (ref 140–400)
RBC: 4.9 10*6/uL (ref 3.80–5.10)
RDW: 13.2 % (ref 11.0–15.0)
Total Lymphocyte: 30.5 %
WBC: 8.6 10*3/uL (ref 3.8–10.8)

## 2022-04-14 LAB — COMPLETE METABOLIC PANEL WITH GFR
AG Ratio: 1.3 (calc) (ref 1.0–2.5)
ALT: 8 U/L (ref 6–29)
AST: 15 U/L (ref 10–35)
Albumin: 4.5 g/dL (ref 3.6–5.1)
Alkaline phosphatase (APISO): 97 U/L (ref 37–153)
BUN: 15 mg/dL (ref 7–25)
CO2: 30 mmol/L (ref 20–32)
Calcium: 9.5 mg/dL (ref 8.6–10.4)
Chloride: 97 mmol/L — ABNORMAL LOW (ref 98–110)
Creat: 0.86 mg/dL (ref 0.50–1.03)
Globulin: 3.5 g/dL (calc) (ref 1.9–3.7)
Glucose, Bld: 100 mg/dL — ABNORMAL HIGH (ref 65–99)
Potassium: 3.8 mmol/L (ref 3.5–5.3)
Sodium: 136 mmol/L (ref 135–146)
Total Bilirubin: 0.6 mg/dL (ref 0.2–1.2)
Total Protein: 8 g/dL (ref 6.1–8.1)
eGFR: 78 mL/min/{1.73_m2} (ref >=60)

## 2022-04-14 LAB — URINALYSIS, ROUTINE W REFLEX MICROSCOPIC
Bilirubin Urine: NEGATIVE
Glucose, UA: NEGATIVE
Hgb urine dipstick: NEGATIVE
Ketones, ur: NEGATIVE
Leukocytes,Ua: NEGATIVE
Nitrite: NEGATIVE
Protein, ur: NEGATIVE
Specific Gravity, Urine: 1.024 (ref 1.001–1.035)
pH: 6 (ref 5.0–8.0)

## 2022-04-14 LAB — ANTI-DNA ANTIBODY, DOUBLE-STRANDED: ds DNA Ab: <1 IU/mL

## 2022-04-14 LAB — SEDIMENTATION RATE: Sed Rate: 41 mm/h — ABNORMAL HIGH (ref 0–30)

## 2022-04-14 LAB — C3 AND C4
C3 Complement: 188 mg/dL (ref 83–193)
C4 Complement: 33 mg/dL (ref 15–57)

## 2022-04-15 DIAGNOSIS — M5416 Radiculopathy, lumbar region: Secondary | ICD-10-CM | POA: Diagnosis not present

## 2022-04-15 NOTE — Progress Notes (Signed)
Plt count remains elevated but stable.  ?CMP WNL.  ?ESR remains elevated but stable. UA normal. Complements WNL. dsDNA is negative.  Labs are not consistent with a flare.

## 2022-04-22 DIAGNOSIS — M5416 Radiculopathy, lumbar region: Secondary | ICD-10-CM | POA: Diagnosis not present

## 2022-04-28 DIAGNOSIS — M961 Postlaminectomy syndrome, not elsewhere classified: Secondary | ICD-10-CM | POA: Diagnosis not present

## 2022-04-28 DIAGNOSIS — M5416 Radiculopathy, lumbar region: Secondary | ICD-10-CM | POA: Diagnosis not present

## 2022-04-29 DIAGNOSIS — M5416 Radiculopathy, lumbar region: Secondary | ICD-10-CM | POA: Diagnosis not present

## 2022-04-30 DIAGNOSIS — E1165 Type 2 diabetes mellitus with hyperglycemia: Secondary | ICD-10-CM | POA: Diagnosis not present

## 2022-05-02 ENCOUNTER — Other Ambulatory Visit: Payer: Self-pay | Admitting: Physician Assistant

## 2022-05-10 DIAGNOSIS — Z01818 Encounter for other preprocedural examination: Secondary | ICD-10-CM | POA: Diagnosis not present

## 2022-05-10 DIAGNOSIS — Z9889 Other specified postprocedural states: Secondary | ICD-10-CM | POA: Diagnosis not present

## 2022-05-10 DIAGNOSIS — M961 Postlaminectomy syndrome, not elsewhere classified: Secondary | ICD-10-CM | POA: Diagnosis not present

## 2022-05-13 DIAGNOSIS — M5416 Radiculopathy, lumbar region: Secondary | ICD-10-CM | POA: Diagnosis not present

## 2022-05-19 DIAGNOSIS — M961 Postlaminectomy syndrome, not elsewhere classified: Secondary | ICD-10-CM | POA: Diagnosis not present

## 2022-05-26 ENCOUNTER — Telehealth: Payer: Self-pay | Admitting: Primary Care

## 2022-05-26 NOTE — Telephone Encounter (Signed)
Left message for patient to call back and schedule Medicare Annual Wellness Visit (AWV) either virtually or phone   [place] 11/26/20 per [facility]  ; please schedule at anytime with health coach  This should be a 45 minute visit.  I left my direct # [phone]

## 2022-05-27 DIAGNOSIS — M5416 Radiculopathy, lumbar region: Secondary | ICD-10-CM | POA: Diagnosis not present

## 2022-05-28 ENCOUNTER — Ambulatory Visit: Payer: Medicare Other

## 2022-05-30 DIAGNOSIS — E1165 Type 2 diabetes mellitus with hyperglycemia: Secondary | ICD-10-CM | POA: Diagnosis not present

## 2022-05-31 ENCOUNTER — Ambulatory Visit: Payer: Medicare Other

## 2022-06-16 ENCOUNTER — Other Ambulatory Visit: Payer: Self-pay | Admitting: Primary Care

## 2022-06-16 DIAGNOSIS — I10 Essential (primary) hypertension: Secondary | ICD-10-CM

## 2022-06-16 NOTE — Telephone Encounter (Signed)
Called patient not sure why it looks like she has not had filled. She has been taking daily with no missed doses.

## 2022-06-18 ENCOUNTER — Other Ambulatory Visit: Payer: Self-pay | Admitting: Primary Care

## 2022-06-18 DIAGNOSIS — M961 Postlaminectomy syndrome, not elsewhere classified: Secondary | ICD-10-CM | POA: Diagnosis not present

## 2022-06-18 DIAGNOSIS — E1122 Type 2 diabetes mellitus with diabetic chronic kidney disease: Secondary | ICD-10-CM | POA: Diagnosis not present

## 2022-06-18 DIAGNOSIS — E1142 Type 2 diabetes mellitus with diabetic polyneuropathy: Secondary | ICD-10-CM | POA: Diagnosis not present

## 2022-06-18 DIAGNOSIS — Z800 Family history of malignant neoplasm of digestive organs: Secondary | ICD-10-CM | POA: Diagnosis not present

## 2022-06-18 DIAGNOSIS — I129 Hypertensive chronic kidney disease with stage 1 through stage 4 chronic kidney disease, or unspecified chronic kidney disease: Secondary | ICD-10-CM | POA: Diagnosis not present

## 2022-06-18 DIAGNOSIS — E785 Hyperlipidemia, unspecified: Secondary | ICD-10-CM | POA: Diagnosis not present

## 2022-06-18 DIAGNOSIS — F331 Major depressive disorder, recurrent, moderate: Secondary | ICD-10-CM

## 2022-06-18 DIAGNOSIS — F411 Generalized anxiety disorder: Secondary | ICD-10-CM

## 2022-06-18 DIAGNOSIS — N189 Chronic kidney disease, unspecified: Secondary | ICD-10-CM | POA: Diagnosis not present

## 2022-06-23 DIAGNOSIS — Z09 Encounter for follow-up examination after completed treatment for conditions other than malignant neoplasm: Secondary | ICD-10-CM | POA: Diagnosis not present

## 2022-06-23 DIAGNOSIS — M961 Postlaminectomy syndrome, not elsewhere classified: Secondary | ICD-10-CM | POA: Diagnosis not present

## 2022-06-25 ENCOUNTER — Ambulatory Visit: Payer: Medicare Other | Admitting: Primary Care

## 2022-06-25 DIAGNOSIS — M961 Postlaminectomy syndrome, not elsewhere classified: Secondary | ICD-10-CM | POA: Diagnosis not present

## 2022-06-25 DIAGNOSIS — I129 Hypertensive chronic kidney disease with stage 1 through stage 4 chronic kidney disease, or unspecified chronic kidney disease: Secondary | ICD-10-CM | POA: Diagnosis not present

## 2022-06-25 DIAGNOSIS — Z800 Family history of malignant neoplasm of digestive organs: Secondary | ICD-10-CM | POA: Diagnosis not present

## 2022-06-25 DIAGNOSIS — E1142 Type 2 diabetes mellitus with diabetic polyneuropathy: Secondary | ICD-10-CM | POA: Diagnosis not present

## 2022-06-25 DIAGNOSIS — Z881 Allergy status to other antibiotic agents status: Secondary | ICD-10-CM | POA: Diagnosis not present

## 2022-06-25 DIAGNOSIS — Z91048 Other nonmedicinal substance allergy status: Secondary | ICD-10-CM | POA: Diagnosis not present

## 2022-06-25 DIAGNOSIS — E1122 Type 2 diabetes mellitus with diabetic chronic kidney disease: Secondary | ICD-10-CM | POA: Diagnosis not present

## 2022-06-25 DIAGNOSIS — N183 Chronic kidney disease, stage 3 unspecified: Secondary | ICD-10-CM | POA: Diagnosis not present

## 2022-06-25 DIAGNOSIS — E785 Hyperlipidemia, unspecified: Secondary | ICD-10-CM | POA: Diagnosis not present

## 2022-06-25 DIAGNOSIS — Z888 Allergy status to other drugs, medicaments and biological substances status: Secondary | ICD-10-CM | POA: Diagnosis not present

## 2022-06-25 HISTORY — PX: SPINAL CORD STIMULATOR INSERTION: SHX5378

## 2022-06-29 DIAGNOSIS — E1165 Type 2 diabetes mellitus with hyperglycemia: Secondary | ICD-10-CM | POA: Diagnosis not present

## 2022-07-04 ENCOUNTER — Other Ambulatory Visit: Payer: Self-pay | Admitting: Primary Care

## 2022-07-04 DIAGNOSIS — I10 Essential (primary) hypertension: Secondary | ICD-10-CM

## 2022-07-04 DIAGNOSIS — E1165 Type 2 diabetes mellitus with hyperglycemia: Secondary | ICD-10-CM

## 2022-07-04 MED ORDER — TELMISARTAN-HCTZ 80-25 MG PO TABS: 1.0000 | ORAL_TABLET | Freq: Every day | ORAL | 1 refills | Status: DC

## 2022-07-04 MED ORDER — TRESIBA FLEXTOUCH 100 UNIT/ML ~~LOC~~ SOPN: 45.0000 [IU] | PEN_INJECTOR | Freq: Every day | SUBCUTANEOUS | 0 refills | Status: DC

## 2022-07-06 NOTE — Progress Notes (Signed)
Office Visit Note  Patient: Lauren Winters             Date of Birth: 12-17-1966           MRN: [id number]             PCP: Pleas Koch, NP Referring: Pleas Koch, NP Visit Date: 07/20/2022 Occupation: _0 @  Subjective:  Medication management  History of Present Illness: Lauren Winters is a 56 y.o. female with history of systemic lupus, osteoarthritis, degenerative disc disease and fibromyalgia syndrome.  She states that she had lumbar spine stimulator unit placement in June 2023 at [facility].  She continues to have lower back pain.  She has not noticed any relief so far.  She continues have some discomfort in her bilateral hands and her bilateral knee joints.  She has some tenderness over trochanteric area.  She denies any history of oral ulcers or nasal ulcers.  She gives history of fatigue, sicca symptoms, Raynaud's phenomenon and lymphadenopathy.  She continues to have some nausea on the day she takes methotrexate and the following day.  She is requesting a prescription for antiemetic.  Activities of Daily Living:  Patient reports morning stiffness for several hours.   Patient Reports nocturnal pain.  Difficulty dressing/grooming: Reports Difficulty climbing stairs: Reports Difficulty getting out of chair: Reports Difficulty using hands for taps, buttons, cutlery, and/or writing: Reports  Review of Systems  Constitutional:  Positive for fatigue.  HENT:  Positive for mouth dryness. Negative for mouth sores.   Eyes:  Positive for dryness.  Respiratory:  Negative for difficulty breathing.   Cardiovascular:  Negative for chest pain and palpitations.  Gastrointestinal:  Negative for blood in stool, constipation and diarrhea.  Endocrine: Negative for increased urination.  Genitourinary:  Positive for involuntary urination.  Musculoskeletal:  Positive for joint pain, joint pain and morning stiffness. Negative for joint swelling, myalgias, muscle weakness,  muscle tenderness and myalgias.  Skin:  Positive for color change and hair loss. Negative for rash and sensitivity to sunlight.  Allergic/Immunologic: Negative for susceptible to infections.  Neurological:  Positive for headaches. Negative for dizziness.  Hematological:  Positive for swollen glands.  Psychiatric/Behavioral:  Positive for depressed mood and sleep disturbance. The patient is nervous/anxious.     PMFS History:  Patient Active Problem List   Diagnosis Date Noted   Post laminectomy syndrome 05/19/2022   Type 2 diabetes mellitus with hyperglycemia, with long-term current use of insulin ([place]) 12/08/2021   Persistent cough for 3 weeks or longer 12/08/2021   Encounter for pain management counseling 07/25/2021   History of infection of neurostimulator battery w/ removal of device 06/12/2021    Class: History of   Elevated C-reactive protein (CRP) 06/12/2021   Elevated sed rate 06/12/2021   DDD (degenerative disc disease), thoracic 06/12/2021   DDD (degenerative disc disease), cervical 06/12/2021   Osteoarthritis of hip (Right) 06/12/2021   Other idiopathic scoliosis, thoracolumbar region 06/12/2021   Fixation hardware in spine (L3-4 and L4-5) 06/12/2021   Chronic pain syndrome 06/10/2021   Pharmacologic therapy 06/10/2021   Disorder of skeletal system 06/10/2021   Problems influencing health status 06/10/2021   Vitamin D deficiency 06/10/2021   Positive ANA (antinuclear antibody) 06/10/2021   Elevated rheumatoid factor 06/10/2021   Elevated d-dimer 06/10/2021   Chronic anticoagulation (Plavix) 06/10/2021   Chronic lower extremity pain (3ry area of Pain) (Right) 06/10/2021   Diabetic peripheral neuropathy (4th area of Pain) (HCC) 06/10/2021   Chronic, intermittent,  rib pain (8th area of Pain) (Bilateral) 06/10/2021   Thoracic radiculitis (T6) (intermittent) (Bilateral) 06/10/2021   Chronic neck pain (6th area of Pain) (posterior) (Midline) (R>L) 06/10/2021   Cervicalgia  06/10/2021   Chronic upper extremity pain (7th area of Pain) (Right) 06/10/2021   Cough 01/14/2021   Adenopathy, cervical 08/18/2020   Monoclonal gammopathy 05/14/2020   Bilateral primary osteoarthritis of hip 10/19/2019   Paresthesias 03/02/2019   Chronic hand pain (Bilateral) 12/06/2018   Headache disorder 12/06/2018   Left arm weakness 10/11/2018   Numbness and tingling in left arm 10/11/2018   Essential hypertension 10/06/2018   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[place]) 10/06/2018   Stage 3 chronic kidney disease ([place]) 10/06/2018   History of stroke 08/19/2018   History of lumbar spinal fusion (L3-4, L4-5) 05/26/2018   Local infection of wound 02/07/2018   High risk medication use 02/03/2018   Trochanteric bursitis 06/09/2017   Lumbar post-laminectomy syndrome (L3-4, L4-5) 06/09/2017   Rheumatoid arthritis involving multiple sites with positive rheumatoid factor ([place]) 06/09/2017   HNP (herniated nucleus pulposus), lumbar 03/29/2017   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   Chronic shoulder pain (Right) 02/18/2017   Herniation of nucleus pulposus of cervical intervertebral disc without myelopathy 02/09/2017   Lumbosacral spondylosis with radiculopathy 01/21/2017   Primary osteoarthritis of both hands 12/22/2016   Primary osteoarthritis of both knees 12/22/2016   Spondylosis of lumbar region without myelopathy or radiculopathy 12/22/2016   Fibromyalgia 12/22/2016   Preventative health care 04/19/2016   Chronic hip pain (2ry area of Pain) (Bilateral) 03/09/2016   Disorder of sacroiliac joint 02/23/2016   Chronic low back pain (1ry area of Pain) (Bilateral) (R>L) w/o sciatica 02/04/2016   Chronic mid back pain (5th area of Pain) (Bilateral) 12/17/2015   Displacement of lumbar intervertebral disc without myelopathy 10/29/2015   Hyperlipidemia 08/29/2015   Lupus ([place]) 08/04/2015   Rheumatoid factor positive 08/04/2015   Sjogren's syndrome ([place]) 08/04/2015   Mitral  regurgitation    Atypical chest pain    Obesity (BMI 30-39.9)    GAD (generalized anxiety disorder)    Depression 05/23/2014   Chest pain 05/23/2014    Past Medical History:  Diagnosis Date   Anxiety    Atypical chest pain    a. ETT 05/2014: normal stress test without evidence of myocardial ischemia or chest pain at peak stress; b. 02/2021 Cor CTA: Ca2+ score = 0. Nl cors.   Complication of anesthesia    migraine headache   Depression    Diabetes mellitus without complication ([place]) metformin   dx 2015   Dyspnea    on exertion   Fibromyalgia    Headache    Heart murmur    a. told back in 2007, but no mention with 2016 cardiac w/u; b. 02/2019 Echo: EF 60-65%, conc LVH. Nl RV fxn. Mild Ca2+ of MV. Triv AI.   Hyperlipidemia    Hypertension    Lumbar radiculopathy    Lupus (systemic lupus erythematosus) (HCC)    Mitral regurgitation    a. 05/2014 Echo: Mild MR/AI; b. 05/2019 Echo: Mild Ca2+ of MV.   Obesity (BMI 30-39.9)    Palpitations    a. 02/2021 Zio: Predominantly SR @ 75 (44-167). 8 SVT runs (max 167 bpm, longest 10 beats). ? Atrial tach. Triggered events assoc w/ SVT. Otw rare isolated PACs/PVCs. No afib/flutter.   Raynaud's phenomenon    Rheumatoid arthritis ([place])    S/P lumbar laminectomy 12/09/2017   S/P lumbar spinal fusion 05/26/2018  Sjogren's disease ([place])    Spinal stenosis    Spondylolisthesis of lumbar region    Stroke [facility])    Tobacco abuse    a. ongoing; b. ongoing SOB   Wound infection after surgery 01/11/2018    Family History  Problem Relation Age of Onset   CAD Father 52       CABG x 4   Liver cancer Father 60       passed   Valvular heart disease Father    Hypertension Father    CAD Paternal 23        CABG   Breast cancer Paternal Aunt    CAD Paternal Aunt        CABG   Breast cancer Paternal Aunt    CAD Paternal Aunt        CABG   Breast cancer Paternal Aunt    CAD Paternal Aunt        CABG   Breast cancer Paternal Aunt    CAD Paternal  Aunt        CABG   Breast cancer Paternal Aunt    CAD Paternal Uncle        MI s/p CABG   Stroke Maternal Grandmother    Stroke Maternal Grandfather    CAD Maternal Grandfather    Heart Problems Brother    Past Surgical History:  Procedure Laterality Date   ABDOMINAL HYSTERECTOMY     BACK SURGERY     BONE MARROW BIOPSY  10/2019   CESAREAN SECTION     x3   LUMBAR LAMINECTOMY     LUMBAR LAMINECTOMY/DECOMPRESSION MICRODISCECTOMY N/A 03/29/2017   Procedure: Re operative Right Lumbar Three-Four Laminectomy and discectomy with Left Lumbar Three-Four, Bilateral Lumbar Four-Five Laminectomy for decompression;  Surgeon: Kevan Ny Ditty, MD;  Location: [place];  Service: Neurosurgery;  Laterality: N/A;   SPINAL CORD STIMULATOR INSERTION  12/09/2017   Procedure: LUMBAR SPINAL CORD STIMULATOR IMPLANT;  Surgeon: Eustace Moore, MD;  Location: [place];  Service: Neurosurgery;;   SPINAL CORD STIMULATOR INSERTION     06/18/2022 leads placed, 06/25/2022 battery placed   SPINAL CORD STIMULATOR REMOVAL N/A 01/11/2018   Procedure: LUMBAR SPINAL CORD STIMULATOR REMOVAL;  Surgeon: Eustace Moore, MD;  Location: [place];  Service: Neurosurgery;  Laterality: N/A;   TUBAL LIGATION     Social History   Social History Narrative   Married.   3 children.   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    Immunization History  Administered Date(s) Administered   Influenza,inj,Quad PF,6+ Mos 10/19/2016, 10/21/2017, 01/31/2019, 10/19/2019, 12/08/2021   PFIZER(Purple Top)SARS-COV-2 Vaccination 04/17/2020, 05/08/2020   Pneumococcal Polysaccharide-23 10/27/2014, 06/25/2019   Td 04/19/2016   Zoster Recombinat (Shingrix) 03/26/2022   Zoster, Live 10/27/2014     Objective: Vital Signs: BP 128/83 (BP Location: Left Arm, Patient Position: Sitting, Cuff Size: Normal)   Pulse (!) 111   Ht _0  (1.676 m)   Wt 204 lb 3.2 oz (92.6 kg)   BMI 32.96 kg/m    Physical Exam Vitals and  nursing note reviewed.  Constitutional:      Appearance: She is well-developed.  HENT:     Head: Normocephalic and atraumatic.  Eyes:     Conjunctiva/sclera: Conjunctivae normal.  Cardiovascular:     Rate and Rhythm: Normal rate and regular rhythm.     Heart sounds: Normal heart sounds.  Pulmonary:     Effort: Pulmonary effort is normal.  Breath sounds: Normal breath sounds.  Abdominal:     General: Bowel sounds are normal.     Palpations: Abdomen is soft.  Musculoskeletal:     Cervical back: Normal range of motion.  Lymphadenopathy:     Cervical: No cervical adenopathy.  Skin:    General: Skin is warm and dry.     Capillary Refill: Capillary refill takes less than 2 seconds.  Neurological:     Mental Status: She is alert and oriented to person, place, and time.  Psychiatric:        Behavior: Behavior normal.      Musculoskeletal Exam: She had limited lateral rotation of the cervical spine.  She has limited painful range of motion of lumbar spine.  Shoulder joints and elbow joints in good range of motion.  There was no tenderness over wrist joints, MCPs PIPs and DIPs.  No synovitis was noted.  She had limited range of motion of her hip joints due to lower back pain.  Knee joints with good range of motion without any warmth swelling or effusion.  There was no tenderness over ankles or MTPs.  She had generalized hyperalgesia and positive tender points.  CDAI Exam: CDAI Score: -- Patient Global: --; Provider Global: -- Swollen: --; Tender: -- Joint Exam 07/20/2022   No joint exam has been documented for this visit   There is currently no information documented on the homunculus. Go to the Rheumatology activity and complete the homunculus joint exam.  Investigation: No additional findings.  Imaging: No results found.  Recent Labs: Lab Results  Component Value Date   WBC 8.6 04/13/2022   HGB 14.1 04/13/2022   PLT 444 (H) 04/13/2022   NA 136 04/13/2022   K 3.8  04/13/2022   CL 97 (L) 04/13/2022   CO2 30 04/13/2022   GLUCOSE 100 (H) 04/13/2022   BUN 15 04/13/2022   CREATININE 0.86 04/13/2022   BILITOT 0.6 04/13/2022   ALKPHOS 102 06/10/2021   AST 15 04/13/2022   ALT 8 04/13/2022   PROT 8.0 04/13/2022   ALBUMIN 4.4 06/10/2021   CALCIUM 9.5 04/13/2022   GFRAA 103 10/20/2020    Speciality Comments: PLQ Eye Exam: 10/24/2020 WNL @ [place] Follow up in 1 year  Procedures:  No procedures performed Allergies: Pseudoephedrine hcl, Ancef [cefazolin], Clindamycin/lincomycin, Prednisone, Triprolidine hcl, Adhesive [tape], Other, and Sudafed [pseudoephedrine hcl]   Assessment / Plan:     Visit Diagnoses: Other organ or system involvement in systemic lupus erythematosus ([place]) - +La, oral ulcers, malar rash, fatigue, photosensitivity, Raynaud's, Subcutaneous lupus-bx+, hair loss, +RF, sicca symptoms:  -She continues to do well on hydroxychloroquine 1 tablet p.o. twice daily and methotrexate 7 tablets p.o. weekly.  She states she has been experiencing nausea when she takes methotrexate.  I will call in prescription for Zofran.  No synovitis was noted.  She continues to have sicca symptoms and Raynaud's phenomenon.  She denies any history of malar rash, oral ulcers, nasal ulcers, photosensitivity.  She notices lymphadenopathy occasionally in her neck.  No lymphadenopathy was noted today on the examination.  Plan: Protein / creatinine ratio, urine, CBC with Differential/Platelet, COMPLETE METABOLIC PANEL WITH GFR, Anti-DNA antibody, double-stranded, C3 and C4, Sedimentation rate  High risk medication use - Plaquenil 200 mg 1 tablet by mouth twice daily, methotrexate 7 tablets by mouth once weekly, folic acid 24m po qd.  -Labs obtained on April 13, 2022 were reviewed.  CBC and CMP were within normal limits.  Sed rate  was slightly elevated at 41.  Double-stranded DNA was negative and complements were normal.  I will obtain autoimmune labs today.  Plan: CBC  with Differential/Platelet, COMPLETE METABOLIC PANEL WITH GFR.  She was advised to stop methotrexate if she develops an infection and resume if the infection resolves.  Information regarding mineralization was provided.  Raynaud's syndrome without gangrene-she gives history of intermittent raynaud's phenominon.  Primary osteoarthritis of both hands-bilateral PIP and DIP thickening with no synovitis was noted.  Primary osteoarthritis of both knees -she denies any knee joint discomfort.  No warmth swelling or effusion was noted.  SynviscOne injections in bilateral knee joints on 03/03/2020.   Trochanteric bursitis of both hips-IT band stretches were discussed after her lower back pain improves.  S/P lumbar laminectomy-she recently had a stim unit placed in [facility].  She continues to have lower back pain.  She has difficulty walking due to lower back pain.  Nausea-she experiences nausea after the methotrexate dose and the following day.  I gave her prescription for Zofran 4 mg p.o. 3 times daily as needed total 20 tablets with no refills.  Side effects were discussed.  Fibromyalgia-she continues to have generalized pain and discomfort from fibromyalgia.  Need for stretching exercises were discussed.  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  Mitral valve insufficiency, unspecified etiology  Essential hypertension  Diabetes mellitus without complication ([place])  Vitamin D deficiency - Her vitamin D was 12 on January 12, 2022.  Patient states she has been taking vitamin D on a regular basis.Plan: VITAMIN D 25 Hydroxy (Vit-D Deficiency, Fractures)  Osteoporosis screening - May 04, 2017 DEXA scan was normal done by her PCP.  Due to update DEXA in May 2023.  Patient states that she will be scheduling DEXA scan through her PCP.  Former smoker  Orders: Orders Placed This Encounter  Procedures   Protein /  creatinine ratio, urine   CBC with Differential/Platelet   COMPLETE METABOLIC PANEL WITH GFR   Anti-DNA antibody, double-stranded   C3 and C4   Sedimentation rate   VITAMIN D 25 Hydroxy (Vit-D Deficiency, Fractures)   Meds ordered this encounter  Medications   ondansetron (ZOFRAN) 4 MG tablet    Sig: Take 1 tablet (4 mg total) by mouth every 8 (eight) hours as needed for nausea or vomiting.    Dispense:  20 tablet    Refill:  0     Follow-Up Instructions: Return in about 3 months (around 10/20/2022) for Systemic lupus, Osteoarthritis.   Bo Merino, MD  Note - This record has been created using Editor, commissioning.  Chart creation errors have been sought, but may not always  have been located. Such creation errors do not reflect on  the standard of medical care.

## 2022-07-07 DIAGNOSIS — M961 Postlaminectomy syndrome, not elsewhere classified: Secondary | ICD-10-CM | POA: Diagnosis not present

## 2022-07-14 ENCOUNTER — Ambulatory Visit: Payer: Medicare Other | Admitting: Primary Care

## 2022-07-19 ENCOUNTER — Ambulatory Visit (INDEPENDENT_AMBULATORY_CARE_PROVIDER_SITE_OTHER): Payer: Medicare Other

## 2022-07-19 VITALS — Wt 203.0 lb

## 2022-07-19 DIAGNOSIS — Z1231 Encounter for screening mammogram for malignant neoplasm of breast: Secondary | ICD-10-CM

## 2022-07-19 DIAGNOSIS — Z780 Asymptomatic menopausal state: Secondary | ICD-10-CM

## 2022-07-19 DIAGNOSIS — Z0000 Encounter for general adult medical examination without abnormal findings: Secondary | ICD-10-CM

## 2022-07-19 NOTE — Patient Instructions (Signed)
Lauren Winters , Thank you for taking time to come for your Medicare Wellness Visit. I appreciate your ongoing commitment to your health goals. Please review the following plan we discussed and let me know if I can assist you in the future.   Screening recommendations/referrals: Colonoscopy: declined at this time Mammogram: referral sent Bone Density: referral sent Recommended yearly ophthalmology/optometry visit for glaucoma screening and checkup Recommended yearly dental visit for hygiene and checkup  Vaccinations: Influenza vaccine: 12/08/21 Pneumococcal vaccine: 06/25/19 Tdap vaccine: 04/19/16 Shingles vaccine: Zostavax 10/27/14    Shingrix 03/26/22, needs 2nd shot  Covid-19: 04/17/20, 05/08/20  Advanced directives: no  Conditions/risks identified: none  Next appointment: Follow up in one year for your annual wellness visit. 07/21/23  @ 9:15 am by phone  Preventive Care 40-64 Years, Female Preventive care refers to lifestyle choices and visits with your health care provider that can promote health and wellness. What does preventive care include? A yearly physical exam. This is also called an annual well check. Dental exams once or twice a year. Routine eye exams. Ask your health care provider how often you should have your eyes checked. Personal lifestyle choices, including: Daily care of your teeth and gums. Regular physical activity. Eating a healthy diet. Avoiding tobacco and drug use. Limiting alcohol use. Practicing safe sex. Taking low-dose aspirin daily starting at age 64. Taking vitamin and mineral supplements as recommended by your health care provider. What happens during an annual well check? The services and screenings done by your health care provider during your annual well check will depend on your age, overall health, lifestyle risk factors, and family history of disease. Counseling  Your health care provider may ask you questions about your: Alcohol use. Tobacco  use. Drug use. Emotional well-being. Home and relationship well-being. Sexual activity. Eating habits. Work and work Statistician. Method of birth control. Menstrual cycle. Pregnancy history. Screening  You may have the following tests or measurements: Height, weight, and BMI. Blood pressure. Lipid and cholesterol levels. These may be checked every 5 years, or more frequently if you are over 49 years old. Skin check. Lung cancer screening. You may have this screening every year starting at age 82 if you have a 30-pack-year history of smoking and currently smoke or have quit within the past 15 years. Fecal occult blood test (FOBT) of the stool. You may have this test every year starting at age 53. Flexible sigmoidoscopy or colonoscopy. You may have a sigmoidoscopy every 5 years or a colonoscopy every 10 years starting at age 27. Hepatitis C blood test. Hepatitis B blood test. Sexually transmitted disease (STD) testing. Diabetes screening. This is done by checking your blood sugar (glucose) after you have not eaten for a while (fasting). You may have this done every 1-3 years. Mammogram. This may be done every 1-2 years. Talk to your health care provider about when you should start having regular mammograms. This may depend on whether you have a family history of breast cancer. BRCA-related cancer screening. This may be done if you have a family history of breast, ovarian, tubal, or peritoneal cancers. Pelvic exam and Pap test. This may be done every 3 years starting at age 42. Starting at age 65, this may be done every 5 years if you have a Pap test in combination with an HPV test. Bone density scan. This is done to screen for osteoporosis. You may have this scan if you are at high risk for osteoporosis. Discuss your test results, treatment options,  and if necessary, the need for more tests with your health care provider. Vaccines  Your health care provider may recommend certain vaccines,  such as: Influenza vaccine. This is recommended every year. Tetanus, diphtheria, and acellular pertussis (Tdap, Td) vaccine. You may need a Td booster every 10 years. Zoster vaccine. You may need this after age 26. Pneumococcal 13-valent conjugate (PCV13) vaccine. You may need this if you have certain conditions and were not previously vaccinated. Pneumococcal polysaccharide (PPSV23) vaccine. You may need one or two doses if you smoke cigarettes or if you have certain conditions. Talk to your health care provider about which screenings and vaccines you need and how often you need them. This information is not intended to replace advice given to you by your health care provider. Make sure you discuss any questions you have with your health care provider. Document Released: 01/09/2016 Document Revised: 09/01/2016 Document Reviewed: 10/14/2015 Elsevier Interactive Patient Education  2017 [employer] Prevention in the Home Falls can cause injuries. They can happen to people of all ages. There are many things you can do to make your home safe and to help prevent falls. What can I do on the outside of my home? Regularly fix the edges of walkways and driveways and fix any cracks. Remove anything that might make you trip as you walk through a door, such as a raised step or threshold. Trim any bushes or trees on the path to your home. Use bright outdoor lighting. Clear any walking paths of anything that might make someone trip, such as rocks or tools. Regularly check to see if handrails are loose or broken. Make sure that both sides of any steps have handrails. Any raised decks and porches should have guardrails on the edges. Have any leaves, snow, or ice cleared regularly. Use sand or salt on walking paths during winter. Clean up any spills in your garage right away. This includes oil or grease spills. What can I do in the bathroom? Use night lights. Install grab bars by the toilet and  in the tub and shower. Do not use towel bars as grab bars. Use non-skid mats or decals in the tub or shower. If you need to sit down in the shower, use a plastic, non-slip stool. Keep the floor dry. Clean up any water that spills on the floor as soon as it happens. Remove soap buildup in the tub or shower regularly. Attach bath mats securely with double-sided non-slip rug tape. Do not have throw rugs and other things on the floor that can make you trip. What can I do in the bedroom? Use night lights. Make sure that you have a light by your bed that is easy to reach. Do not use any sheets or blankets that are too big for your bed. They should not hang down onto the floor. Have a firm chair that has side arms. You can use this for support while you get dressed. Do not have throw rugs and other things on the floor that can make you trip. What can I do in the kitchen? Clean up any spills right away. Avoid walking on wet floors. Keep items that you use a lot in easy-to-reach places. If you need to reach something above you, use a strong step stool that has a grab bar. Keep electrical cords out of the way. Do not use floor polish or wax that makes floors slippery. If you must use wax, use non-skid floor wax. Do not  have throw rugs and other things on the floor that can make you trip. What can I do with my stairs? Do not leave any items on the stairs. Make sure that there are handrails on both sides of the stairs and use them. Fix handrails that are broken or loose. Make sure that handrails are as long as the stairways. Check any carpeting to make sure that it is firmly attached to the stairs. Fix any carpet that is loose or worn. Avoid having throw rugs at the top or bottom of the stairs. If you do have throw rugs, attach them to the floor with carpet tape. Make sure that you have a light switch at the top of the stairs and the bottom of the stairs. If you do not have them, ask someone to add  them for you. What else can I do to help prevent falls? Wear shoes that: Do not have high heels. Have rubber bottoms. Are comfortable and fit you well. Are closed at the toe. Do not wear sandals. If you use a stepladder: Make sure that it is fully opened. Do not climb a closed stepladder. Make sure that both sides of the stepladder are locked into place. Ask someone to hold it for you, if possible. Clearly mark and make sure that you can see: Any grab bars or handrails. First and last steps. Where the edge of each step is. Use tools that help you move around (mobility aids) if they are needed. These include: Canes. Walkers. Scooters. Crutches. Turn on the lights when you go into a dark area. Replace any light bulbs as soon as they burn out. Set up your furniture so you have a clear path. Avoid moving your furniture around. If any of your floors are uneven, fix them. If there are any pets around you, be aware of where they are. Review your medicines with your doctor. Some medicines can make you feel dizzy. This can increase your chance of falling. Ask your doctor what other things that you can do to help prevent falls. This information is not intended to replace advice given to you by your health care provider. Make sure you discuss any questions you have with your health care provider. Document Released: 10/09/2009 Document Revised: 05/20/2016 Document Reviewed: 01/17/2015 Elsevier Interactive Patient Education  2017 [employer].

## 2022-07-19 NOTE — Progress Notes (Addendum)
Virtual Visit via Telephone Note  I connected with  Lauren Winters on 07/19/22 at  3:00 PM EDT by telephone and verified that I am speaking with the correct person using two identifiers.  Location: Patient: home Provider: [place] Persons participating in the virtual visit: [place]   I discussed the limitations, risks, security and privacy concerns of performing an evaluation and management service by telephone and the availability of in person appointments. The patient expressed understanding and agreed to proceed.  Interactive audio and video telecommunications were attempted between this nurse and patient, however failed, due to patient having technical difficulties OR patient did not have access to video capability.  We continued and completed visit with audio only.  Some vital signs may be absent or patient reported.   Dionisio David, LPN  Subjective:   Lauren Winters is a 56 y.o. female who presents for Medicare Annual (Subsequent) preventive examination.  Review of Systems     Cardiac Risk Factors include: advanced age (>72mn, >>80women);diabetes mellitus;hypertension     Objective:    There were no vitals filed for this visit. There is no height or weight on file to calculate BMI.     07/19/2022    3:02 PM 03/02/2019    7:00 PM 09/26/2018    4:41 PM 09/12/2018    3:17 PM 08/19/2018    2:20 PM 05/28/2018    2:17 AM 05/17/2018    1:10 PM  Advanced Directives  Does Patient Have a Medical Advance Directive? No No No No No No No  Would patient like information on creating a medical advance directive? No - Patient declined No - Patient declined No - Patient declined No - Patient declined No - Patient declined No - Patient declined No - Patient declined    Current Medications (verified) Outpatient Encounter Medications as of 07/19/2022  Medication Sig   amLODipine (NORVASC) 10 MG tablet TAKE ONE TAB DAILY FOR BLOOD PRESSURE.   Blood Glucose  Monitoring Suppl (OLa Plata w/Device KIT Use as instructed to test blood sugar 3 times daily   buPROPion (WELLBUTRIN XL) 150 MG 24 hr tablet TAKE 1 TABLET BY MOUTH ONCE DAILY FOR ANXIETY AND DEPRESSION.   clopidogrel (PLAVIX) 75 MG tablet TAKE 1 TABLET BY MOUTH EVERY DAY   Continuous Blood Gluc Receiver (FREESTYLE LIBRE 14 DAY READER) DEVI 1 Device by Does not apply route in the morning, at noon, and at bedtime.   Continuous Blood Gluc Sensor (FREESTYLE LIBRE 3 SENSOR) MISC 1 Device by Does not apply route continuous. Place 1 sensor on the skin every 14 days. Use to check glucose continuously   escitalopram (LEXAPRO) 10 MG tablet TAKE 1 TABLET (10 MG TOTAL) BY MOUTH DAILY. FOR ANXIETY AND DEPRESSION.   folic acid (FOLVITE) 1 MG tablet Take 2 tablets by mouth daily.   glucose blood test strip Use as instructed to blood sugar 3 times daily   hydroxychloroquine (PLAQUENIL) 200 MG tablet Take 1 tablet (200 mg total) by mouth 2 (two) times daily.   insulin degludec (TRESIBA FLEXTOUCH) 100 UNIT/ML FlexTouch Pen Inject 45 Units into the skin daily. for diabetes.   Insulin Pen Needle (PEN NEEDLES) 31G X 6 MM MISC Use with insulin as directed.   methotrexate (RHEUMATREX) 2.5 MG tablet Take 7 tablets (17.5 mg total) by mouth once a week. Caution:Chemotherapy. Protect from light.   metoprolol succinate (TOPROL-XL) 100 MG 24 hr tablet Take 1 tablet by mouth daily.  Semaglutide, 1 MG/DOSE, 4 MG/3ML SOPN Inject 1 mg as directed once a week. For diabetes.   spironolactone (ALDACTONE) 25 MG tablet Take 25 mg by mouth daily.   telmisartan-hydrochlorothiazide (MICARDIS HCT) 80-25 MG tablet Take 1 tablet by mouth daily. for blood pressure.   Vitamin D, Ergocalciferol, (DRISDOL) 1.25 MG (50000 UNIT) CAPS capsule Take 1 capsule (50,000 Units total) by mouth 2 (two) times a week.   gabapentin (NEURONTIN) 100 MG capsule Take by mouth. (Patient not taking: Reported on 07/19/2022)   metoprolol succinate  (TOPROL-XL) 50 MG 24 hr tablet Take 1 tablet (50 mg total) by mouth daily. For blood pressure   oxyCODONE (OXY IR/ROXICODONE) 5 MG immediate release tablet Take 5 mg by mouth daily as needed. (Patient not taking: Reported on 07/19/2022)   pregabalin (LYRICA) 25 MG capsule Take by mouth. (Patient not taking: Reported on 07/19/2022)   rosuvastatin (CRESTOR) 20 MG tablet Take 1 tablet (20 mg total) by mouth daily. For cholesterol (Patient not taking: Reported on 07/19/2022)   [DISCONTINUED] cyclobenzaprine (FLEXERIL) 10 MG tablet TAKE 1 TABLET BY MOUTH AT BEDTIME AS NEEDED FOR MUSCLE SPASMS   [DISCONTINUED] hydroxychloroquine (PLAQUENIL) 200 MG tablet Take by mouth.   [DISCONTINUED] pilocarpine (SALAGEN) 5 MG tablet Take 1 tablet (5 mg total) by mouth 2 (two) times daily.   No facility-administered encounter medications on file as of 07/19/2022.    Allergies (verified) Pseudoephedrine hcl, Ancef [cefazolin], Clindamycin/lincomycin, Prednisone, Triprolidine hcl, Adhesive [tape], Other, and Sudafed [pseudoephedrine hcl]   History: Past Medical History:  Diagnosis Date   Anxiety    Atypical chest pain    a. ETT 05/2014: normal stress test without evidence of myocardial ischemia or chest pain at peak stress; b. 02/2021 Cor CTA: Ca2+ score = 0. Nl cors.   Complication of anesthesia    migraine headache   Depression    Diabetes mellitus without complication ([place]) metformin   dx 2015   Dyspnea    on exertion   Fibromyalgia    Headache    Heart murmur    a. told back in 2007, but no mention with 2016 cardiac w/u; b. 02/2019 Echo: EF 60-65%, conc LVH. Nl RV fxn. Mild Ca2+ of MV. Triv AI.   Hyperlipidemia    Hypertension    Lumbar radiculopathy    Lupus (systemic lupus erythematosus) (HCC)    Mitral regurgitation    a. 05/2014 Echo: Mild MR/AI; b. 05/2019 Echo: Mild Ca2+ of MV.   Obesity (BMI 30-39.9)    Palpitations    a. 02/2021 Zio: Predominantly SR @ 75 (44-167). 8 SVT runs (max 167 bpm, longest  10 beats). ? Atrial tach. Triggered events assoc w/ SVT. Otw rare isolated PACs/PVCs. No afib/flutter.   Raynaud's phenomenon    Rheumatoid arthritis ([place])    S/P lumbar laminectomy 12/09/2017   S/P lumbar spinal fusion 05/26/2018   Sjogren's disease ([place])    Spinal stenosis    Spondylolisthesis of lumbar region    Stroke [facility])    Tobacco abuse    a. ongoing; b. ongoing SOB   Wound infection after surgery 01/11/2018   Past Surgical History:  Procedure Laterality Date   ABDOMINAL HYSTERECTOMY     BACK SURGERY     BONE MARROW BIOPSY  10/2019   CESAREAN SECTION     x3   LUMBAR LAMINECTOMY     LUMBAR LAMINECTOMY/DECOMPRESSION MICRODISCECTOMY N/A 03/29/2017   Procedure: Re operative Right Lumbar Three-Four Laminectomy and discectomy with Left Lumbar Three-Four, Bilateral Lumbar Four-Five Laminectomy  for decompression;  Surgeon: Kevan Ny Ditty, MD;  Location: [facility];  Service: Neurosurgery;  Laterality: N/A;   SPINAL CORD STIMULATOR INSERTION  12/09/2017   Procedure: LUMBAR SPINAL CORD STIMULATOR IMPLANT;  Surgeon: Eustace Moore, MD;  Location: [facility];  Service: Neurosurgery;;   SPINAL CORD STIMULATOR REMOVAL N/A 01/11/2018   Procedure: LUMBAR SPINAL CORD STIMULATOR REMOVAL;  Surgeon: Eustace Moore, MD;  Location: [facility];  Service: Neurosurgery;  Laterality: N/A;   TUBAL LIGATION     Family History  Problem Relation Age of Onset   CAD Father 53       CABG x 4   Liver cancer Father 2       passed   Valvular heart disease Father    Hypertension Father    CAD Paternal 81        CABG   Breast cancer Paternal Aunt    CAD Paternal Aunt        CABG   Breast cancer Paternal Aunt    CAD Paternal Aunt        CABG   Breast cancer Paternal Aunt    CAD Paternal Aunt        CABG   Breast cancer Paternal Aunt    CAD Paternal Aunt        CABG   Breast cancer Paternal Aunt    CAD Paternal Uncle        MI s/p CABG   Stroke Maternal Grandmother    Stroke Maternal Grandfather     CAD Maternal Grandfather    Heart Problems Brother    Social History   Socioeconomic History   Marital status: Married    Spouse name: Not on file   Number of children: Not on file   Years of education: Not on file   Highest education level: Not on file  Occupational History   Not on file  Tobacco Use   Smoking status: Former    Packs/day: 0.50    Years: 10.00    Total pack years: 5.00    Types: Cigarettes    Quit date: 07/22/2015    Years since quitting: 6.9   Smokeless tobacco: Never  Vaping Use   Vaping Use: Never used  Substance and Sexual Activity   Alcohol use: No    Alcohol/week: 0.0 standard drinks of alcohol   Drug use: No   Sexual activity: Not Currently  Other Topics Concern   Not on file  Social History Narrative   Married.   3 children.   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    Social Determinants of Health   Financial Resource Strain: Low Risk  (07/19/2022)   Overall Financial Resource Strain (CARDIA)    Difficulty of Paying Living Expenses: Not hard at all  Food Insecurity: No Food Insecurity (07/19/2022)   Hunger Vital Sign    Worried About Running Out of Food in the Last Year: Never true    Ran Out of Food in the Last Year: Never true  Transportation Needs: No Transportation Needs (07/19/2022)   PRAPARE - Hydrologist (Medical): No    Lack of Transportation (Non-Medical): No  Physical Activity: Insufficiently Active (07/19/2022)   Exercise Vital Sign    Days of Exercise per Week: 2 days    Minutes of Exercise per Session: 20 min  Stress: No Stress Concern Present (07/19/2022)   [place]  Feeling of Stress : Not at all  Social Connections: Moderately Isolated (07/19/2022)   Social Connection and Isolation Panel [NHANES]    Frequency of Communication with Friends and Family: More than three times a week    Frequency  of Social Gatherings with Friends and Family: More than three times a week    Attends Religious Services: Never    Marine scientist or Organizations: No    Attends Music therapist: Never    Marital Status: Married    Tobacco Counseling Counseling given: Not Answered   Clinical Intake:  Pre-visit preparation completed: Yes  Pain : No/denies pain     Nutritional Risks: None Diabetes: Yes CBG done?: No Did pt. bring in CBG monitor from home?: No  How often do you need to have someone help you when you read instructions, pamphlets, or other written materials from your doctor or pharmacy?: 1 - Never  Diabetic?yes Nutrition Risk Assessment:  Has the patient had any N/V/D within the last 2 months?  Yes  Does the patient have any non-healing wounds?  No  Has the patient had any unintentional weight loss or weight gain?  No   Diabetes:  Is the patient diabetic?  Yes  If diabetic, was a CBG obtained today?  No  Did the patient bring in their glucometer from home?  No  How often do you monitor your CBG's? Continuous monitor  Financial Strains and Diabetes Management:  Are you having any financial strains with the device, your supplies or your medication? No .  Does the patient want to be seen by Chronic Care Management for management of their diabetes?  No  Would the patient like to be referred to a Nutritionist or for Diabetic Management?  No   Diabetic Exams:  Diabetic Eye Exam: Completed 10/24/20. Overdue for diabetic eye exam. Pt has been advised about the importance in completing this exam..  Diabetic Foot Exam: Completed 03/26/22. Pt has been advised about the importance in completing this exam.     Interpreter Needed?: No  Information entered by :: Kirke Shaggy, LPN   Activities of Daily Living    07/19/2022    3:03 PM 05/31/2022    3:01 PM  In your present state of health, do you have any difficulty performing the following activities:   Hearing? 0 0  Vision? 0 1  Difficulty concentrating or making decisions? 0 1  Walking or climbing stairs? 1 1  Dressing or bathing? 0 1  Doing errands, shopping? 0 1  Preparing Food and eating ? N Y  Using the Toilet? N N  In the past six months, have you accidently leaked urine? N Y  Do you have problems with loss of bowel control? N N  Managing your Medications? N Y  Managing your Finances? N N  Housekeeping or managing your Housekeeping? N Y    Patient Care Team: Pleas Koch, NP as PCP - General (Nurse Practitioner) [name] Sable, MD as PCP - Cardiology (Cardiology) Bo Merino, MD as Consulting Physician (Rheumatology) Franciso Bend, MD (Nephrology) Debbora Dus, [facility] as Pharmacist (Pharmacist)  Indicate any recent Medical Services you may have received from other than [facility] providers in the past year (date may be approximate).     Assessment:   This is a routine wellness examination for Lauren Winters.  Hearing/Vision screen Hearing Screening - Comments:: No aids Vision Screening - Comments:: Wears glasses- Dr.Groat  Dietary issues and exercise activities discussed: Current Exercise Habits: Home  exercise routine, Type of exercise: walking, Time (Minutes): 20, Frequency (Times/Week): 2, Weekly Exercise (Minutes/Week): 40, Intensity: Mild   Goals Addressed             This Visit's Progress    DIET - EAT MORE FRUITS AND VEGETABLES         Depression Screen    07/19/2022    3:01 PM 03/26/2022    1:58 PM 12/08/2021    2:06 PM 06/03/2021   12:19 PM 01/14/2021    9:43 AM 07/27/2017    9:53 AM 04/01/2016    3:49 PM  PHQ 2/9 Scores  PHQ - 2 Score 1 1 0 5 0 5 2  PHQ- 9 Score 2  0 18 0 16 9    Fall Risk    07/19/2022    3:03 PM 05/31/2022    3:01 PM 07/27/2021    8:47 AM 06/10/2021   11:25 AM 06/03/2021   12:21 PM  Fall Risk   Falls in the past year? 1 1 0 1 1  Number falls in past yr: 1   1 1   Injury with Fall? 0 0  0 0  Risk for fall due to : History  of fall(s)      Follow up Falls evaluation completed;Falls prevention discussed        FALL RISK PREVENTION PERTAINING TO THE HOME:  Any stairs in or around the home? No  If so, are there any without handrails? No  Home free of loose throw rugs in walkways, pet beds, electrical cords, etc? Yes  Adequate lighting in your home to reduce risk of falls? Yes   ASSISTIVE DEVICES UTILIZED TO PREVENT FALLS:  Life alert? No  Use of a cane, walker or w/c? Yes  Grab bars in the bathroom? No  Shower chair or bench in shower? Yes  Elevated toilet seat or a handicapped toilet? No    Cognitive Function:        07/19/2022    3:06 PM  6CIT Screen  What Year? 0 points  What month? 0 points  What time? 0 points  Count back from 20 0 points  Months in reverse 0 points  Repeat phrase 0 points  Total Score 0 points    Immunizations Immunization History  Administered Date(s) Administered   Influenza,inj,Quad PF,6+ Mos 10/19/2016, 10/21/2017, 01/31/2019, 10/19/2019, 12/08/2021   PFIZER(Purple Top)SARS-COV-2 Vaccination 04/17/2020, 05/08/2020   Pneumococcal Polysaccharide-23 10/27/2014, 06/25/2019   Td 04/19/2016   Zoster Recombinat (Shingrix) 03/26/2022   Zoster, Live 10/27/2014    TDAP status: Up to date  Flu Vaccine status: Up to date  Pneumococcal vaccine status: Due, Education has been provided regarding the importance of this vaccine. Advised may receive this vaccine at local pharmacy or Health Dept. Aware to provide a copy of the vaccination record if obtained from local pharmacy or Health Dept. Verbalized acceptance and understanding.  Covid-19 vaccine status: Completed vaccines  Qualifies for Shingles Vaccine? Yes   Zostavax completed Yes   Shingrix Completed?: Yes  Screening Tests Health Maintenance  Topic Date Due   COLONOSCOPY (Pts 45-39yr Insurance coverage will need to be confirmed)  Never done   MAMMOGRAM  05/05/2019   COVID-19 Vaccine (3 - Pfizer risk series)  06/05/2020   OPHTHALMOLOGY EXAM  10/24/2021   Zoster Vaccines- Shingrix (2 of 2) 05/21/2022   INFLUENZA VACCINE  07/27/2022   HEMOGLOBIN A1C  09/25/2022   FOOT EXAM  03/27/2023   TETANUS/TDAP  04/19/2026   Hepatitis C Screening  Completed   HIV Screening  Completed   HPV VACCINES  Aged Out   PAP SMEAR-Modifier  Discontinued    Health Maintenance  Health Maintenance Due  Topic Date Due   COLONOSCOPY (Pts 45-37yr Insurance coverage will need to be confirmed)  Never done   MAMMOGRAM  05/05/2019   COVID-19 Vaccine (3 - Pfizer risk series) 06/05/2020   OPHTHALMOLOGY EXAM  10/24/2021   Zoster Vaccines- Shingrix (2 of 2) 05/21/2022    Declined colonoscopy referral at this time  Mammogram status: Ordered today. Pt provided with contact info and advised to call to schedule appt.   Bone Density status: Completed 05/04/17. Results reflect: Bone density results: NORMAL. Repeat every 5 years.  Lung Cancer Screening: (Low Dose CT Chest recommended if Age 58-80years, 30 pack-year currently smoking OR have quit w/in 15years.) does not qualify.     Additional Screening:  Hepatitis C Screening: does qualify; Completed 04/20/18  Vision Screening: Recommended annual ophthalmology exams for early detection of glaucoma and other disorders of the eye. Is the patient up to date with their annual eye exam?  Yes  Who is the provider or what is the name of the office in which the patient attends annual eye exams? Dr.Groat If pt is not established with a provider, would they like to be referred to a provider to establish care? No .   Dental Screening: Recommended annual dental exams for proper oral hygiene  Community Resource Referral / Chronic Care Management: CRR required this visit?  No   CCM required this visit?  No      Plan:     I have personally reviewed and noted the following in the patient's chart:   Medical and social history Use of alcohol, tobacco or illicit drugs  Current  medications and supplements including opioid prescriptions.  Functional ability and status Nutritional status Physical activity Advanced directives List of other physicians Hospitalizations, surgeries, and ER visits in previous 12 months Vitals Screenings to include cognitive, depression, and falls Referrals and appointments  In addition, I have reviewed and discussed with patient certain preventive protocols, quality metrics, and best practice recommendations. A written personalized care plan for preventive services as well as general preventive health recommendations were provided to patient.     LDionisio David LPN   [id number]  Nurse Notes: none

## 2022-07-20 ENCOUNTER — Encounter: Payer: Self-pay | Admitting: Rheumatology

## 2022-07-20 ENCOUNTER — Ambulatory Visit: Payer: Medicare Other | Admitting: Rheumatology

## 2022-07-20 VITALS — BP 128/83 | HR 111 | Ht 66.0 in | Wt 204.2 lb

## 2022-07-20 DIAGNOSIS — M170 Bilateral primary osteoarthritis of knee: Secondary | ICD-10-CM | POA: Diagnosis not present

## 2022-07-20 DIAGNOSIS — Z79899 Other long term (current) drug therapy: Secondary | ICD-10-CM

## 2022-07-20 DIAGNOSIS — I10 Essential (primary) hypertension: Secondary | ICD-10-CM | POA: Diagnosis not present

## 2022-07-20 DIAGNOSIS — R110 Nausea: Secondary | ICD-10-CM | POA: Diagnosis not present

## 2022-07-20 DIAGNOSIS — Z8673 Personal history of transient ischemic attack (TIA), and cerebral infarction without residual deficits: Secondary | ICD-10-CM | POA: Diagnosis not present

## 2022-07-20 DIAGNOSIS — Z9889 Other specified postprocedural states: Secondary | ICD-10-CM

## 2022-07-20 DIAGNOSIS — E559 Vitamin D deficiency, unspecified: Secondary | ICD-10-CM

## 2022-07-20 DIAGNOSIS — I7300 Raynaud's syndrome without gangrene: Secondary | ICD-10-CM

## 2022-07-20 DIAGNOSIS — M797 Fibromyalgia: Secondary | ICD-10-CM

## 2022-07-20 DIAGNOSIS — M3219 Other organ or system involvement in systemic lupus erythematosus: Secondary | ICD-10-CM | POA: Diagnosis not present

## 2022-07-20 DIAGNOSIS — M7061 Trochanteric bursitis, right hip: Secondary | ICD-10-CM

## 2022-07-20 DIAGNOSIS — M19041 Primary osteoarthritis, right hand: Secondary | ICD-10-CM

## 2022-07-20 DIAGNOSIS — E119 Type 2 diabetes mellitus without complications: Secondary | ICD-10-CM

## 2022-07-20 DIAGNOSIS — M7062 Trochanteric bursitis, left hip: Secondary | ICD-10-CM

## 2022-07-20 DIAGNOSIS — Z13820 Encounter for screening for osteoporosis: Secondary | ICD-10-CM

## 2022-07-20 DIAGNOSIS — I340 Nonrheumatic mitral (valve) insufficiency: Secondary | ICD-10-CM

## 2022-07-20 DIAGNOSIS — M19042 Primary osteoarthritis, left hand: Secondary | ICD-10-CM

## 2022-07-20 DIAGNOSIS — Z87891 Personal history of nicotine dependence: Secondary | ICD-10-CM

## 2022-07-20 MED ORDER — ONDANSETRON HCL 4 MG PO TABS: 4.0000 mg | ORAL_TABLET | Freq: Three times a day (TID) | ORAL | 0 refills | Status: DC | PRN

## 2022-07-20 NOTE — Patient Instructions (Signed)
Standing Labs We placed an order today for your standing lab work.   Please have your standing labs drawn in October and every 3 months  If possible, please have your labs drawn 2 weeks prior to your appointment so that the provider can discuss your results at your appointment.  Please note that you may see your imaging and lab results in MyChart before we have reviewed them. We may be awaiting multiple results to interpret others before contacting you. Please allow our office up to 72 hours to thoroughly review all of the results before contacting the office for clarification of your results.  We have open lab daily: Monday through Thursday from 1:30-4:30 PM and Friday from 1:30-4:00 PM at the office of Dr. Xzavior Reinig, [facility].   Please be advised, all patients with office appointments requiring lab work will take precedent over walk-in lab work.  If possible, please come for your lab work on Monday and Friday afternoons, as you may experience shorter wait times. The office is located at [address], [address], [place], [place] [address] No appointment is necessary.   Labs are drawn by [facility]. Please bring your co-pay at the time of your lab draw.  You may receive a bill from [facility] for your lab work.  Please note if you are on Hydroxychloroquine and and an order has been placed for a Hydroxychloroquine level, you will need to have it drawn 4 hours or more after your last dose.  If you wish to have your labs drawn at another location, please call the office 24 hours in advance to send orders.  If you have any questions regarding directions or hours of operation,  please call [phone].   As a reminder, please drink plenty of water prior to coming for your lab work. Thanks!   Vaccines You are taking a medication(s) that can suppress your immune system.  The following immunizations are recommended: Flu annually Covid-19  Td/Tdap (tetanus, diphtheria,  pertussis) every 10 years Pneumonia (Prevnar 15 then Pneumovax 23 at least 1 year apart.  Alternatively, can take Prevnar 20 without needing additional dose) Shingrix: 2 doses from 4 weeks to 6 months apart  Please check with your PCP to make sure you are up to date.   If you have signs or symptoms of an infection or start antibiotics: First, call your PCP for workup of your infection. Hold your medication through the infection, until you complete your antibiotics, and until symptoms resolve if you take the following: Injectable medication (Actemra, Benlysta, Cimzia, Cosentyx, Enbrel, Humira, Kevzara, Orencia, Remicade, Simponi, Stelara, Taltz, Tremfya) Methotrexate Leflunomide (Arava) Mycophenolate (Cellcept) Xeljanz, Olumiant, or Rinvoq  

## 2022-07-21 ENCOUNTER — Other Ambulatory Visit: Payer: Self-pay | Admitting: *Deleted

## 2022-07-21 DIAGNOSIS — E119 Type 2 diabetes mellitus without complications: Secondary | ICD-10-CM

## 2022-07-21 DIAGNOSIS — E559 Vitamin D deficiency, unspecified: Secondary | ICD-10-CM

## 2022-07-21 LAB — CBC WITH DIFFERENTIAL/PLATELET
Absolute Monocytes: 486 cells/uL (ref 200–950)
Basophils Absolute: 72 cells/uL (ref 0–200)
Basophils Relative: 0.8 %
Eosinophils Absolute: 387 cells/uL (ref 15–500)
Eosinophils Relative: 4.3 %
HCT: 42.4 % (ref 35.0–45.0)
Hemoglobin: 14.4 g/dL (ref 11.7–15.5)
Lymphs Abs: 2313 cells/uL (ref 850–3900)
MCH: 28.5 pg (ref 27.0–33.0)
MCHC: 34 g/dL (ref 32.0–36.0)
MCV: 83.8 fL (ref 80.0–100.0)
MPV: 9 fL (ref 7.5–12.5)
Monocytes Relative: 5.4 %
Neutro Abs: 5742 cells/uL (ref 1500–7800)
Neutrophils Relative %: 63.8 %
Platelets: 476 10*3/uL — ABNORMAL HIGH (ref 140–400)
RBC: 5.06 10*6/uL (ref 3.80–5.10)
RDW: 13.4 % (ref 11.0–15.0)
Total Lymphocyte: 25.7 %
WBC: 9 10*3/uL (ref 3.8–10.8)

## 2022-07-21 LAB — PROTEIN / CREATININE RATIO, URINE
Creatinine, Urine: 219 mg/dL (ref 20–275)
Protein/Creat Ratio: 87 mg/g creat (ref 24–184)
Protein/Creatinine Ratio: 0.087 mg/mg creat (ref 0.024–0.184)
Total Protein, Urine: 19 mg/dL (ref 5–24)

## 2022-07-21 LAB — SEDIMENTATION RATE: Sed Rate: 29 mm/h (ref 0–30)

## 2022-07-21 LAB — COMPLETE METABOLIC PANEL WITH GFR
AG Ratio: 1.2 (calc) (ref 1.0–2.5)
ALT: 10 U/L (ref 6–29)
AST: 12 U/L (ref 10–35)
Albumin: 4.4 g/dL (ref 3.6–5.1)
Alkaline phosphatase (APISO): 103 U/L (ref 37–153)
BUN: 12 mg/dL (ref 7–25)
CO2: 29 mmol/L (ref 20–32)
Calcium: 9.6 mg/dL (ref 8.6–10.4)
Chloride: 98 mmol/L (ref 98–110)
Creat: 0.95 mg/dL (ref 0.50–1.03)
Globulin: 3.8 g/dL (calc) — ABNORMAL HIGH (ref 1.9–3.7)
Glucose, Bld: 116 mg/dL — ABNORMAL HIGH (ref 65–99)
Potassium: 3.7 mmol/L (ref 3.5–5.3)
Sodium: 138 mmol/L (ref 135–146)
Total Bilirubin: 0.5 mg/dL (ref 0.2–1.2)
Total Protein: 8.2 g/dL — ABNORMAL HIGH (ref 6.1–8.1)
eGFR: 69 mL/min/{1.73_m2} (ref >=60)

## 2022-07-21 LAB — C3 AND C4
C3 Complement: 185 mg/dL (ref 83–193)
C4 Complement: 28 mg/dL (ref 15–57)

## 2022-07-21 LAB — ANTI-DNA ANTIBODY, DOUBLE-STRANDED: ds DNA Ab: <1 IU/mL

## 2022-07-21 LAB — VITAMIN D 25 HYDROXY (VIT D DEFICIENCY, FRACTURES): Vit D, 25-Hydroxy: 12 ng/mL — ABNORMAL LOW (ref 30–100)

## 2022-07-21 MED ORDER — VITAMIN D (ERGOCALCIFEROL) 1.25 MG (50000 UNIT) PO CAPS: 50000.0000 [IU] | ORAL_CAPSULE | ORAL | 0 refills | Status: DC

## 2022-07-21 NOTE — Telephone Encounter (Signed)
-----   Message from Bo Merino, MD sent at 07/21/2022 12:54 PM EDT ----- Vitamin D is low at 12.  Please call in vitamin D 50,000 units twice a week for 3 months.  She should have repeat vitamin D in 3 months.  She should stay on vitamin D 5000 units daily after finishing the course of vitamin D.

## 2022-07-21 NOTE — Progress Notes (Signed)
Vitamin D is low at 12.  Please call in vitamin D 50,000 units twice a week for 3 months.  She should have repeat vitamin D in 3 months.  She should stay on vitamin D 5000 units daily after finishing the course of vitamin D.

## 2022-07-22 NOTE — Progress Notes (Signed)
CBC shows elevated platelets which is stable.  CMP is normal.  Urine protein creatinine ratio is normal.  Double-stranded DNA is negative complements are normal and sed rate is normal.  Labs do not indicate an active autoimmune disease.

## 2022-07-29 ENCOUNTER — Ambulatory Visit (INDEPENDENT_AMBULATORY_CARE_PROVIDER_SITE_OTHER): Payer: Medicare Other | Admitting: Primary Care

## 2022-07-29 ENCOUNTER — Encounter: Payer: Self-pay | Admitting: Primary Care

## 2022-07-29 VITALS — BP 124/82 | HR 62 | Temp 98.6°F | Ht 66.0 in | Wt 206.0 lb

## 2022-07-29 DIAGNOSIS — F411 Generalized anxiety disorder: Secondary | ICD-10-CM

## 2022-07-29 DIAGNOSIS — Z794 Long term (current) use of insulin: Secondary | ICD-10-CM

## 2022-07-29 DIAGNOSIS — E1165 Type 2 diabetes mellitus with hyperglycemia: Secondary | ICD-10-CM | POA: Diagnosis not present

## 2022-07-29 DIAGNOSIS — E785 Hyperlipidemia, unspecified: Secondary | ICD-10-CM

## 2022-07-29 LAB — POCT GLYCOSYLATED HEMOGLOBIN (HGB A1C): Hemoglobin A1C: 6.8 % — AB (ref 4.0–5.6)

## 2022-07-29 MED ORDER — ROSUVASTATIN CALCIUM 20 MG PO TABS: 20.0000 mg | ORAL_TABLET | Freq: Every day | ORAL | 1 refills | Status: DC

## 2022-07-29 MED ORDER — ESCITALOPRAM OXALATE 20 MG PO TABS: 20.0000 mg | ORAL_TABLET | Freq: Every day | ORAL | 1 refills | Status: DC

## 2022-07-29 NOTE — Assessment & Plan Note (Addendum)
Controlled with A1c of 6.8 today.  Continue semaglutide 1 mg weekly, Tresiba 45 units daily. Foot exam up-to-date. Eye exam due, she has this scheduled. Managed on statin and ARB Pneumonia vaccine up-to-date.  She will notify if she experiences episodes of hypoglycemia.   Follow-up in 6 months.

## 2022-07-29 NOTE — Assessment & Plan Note (Signed)
Improved but room for improvement.  Increase Lexapro to 20 mg daily. She will update if no improvement.  Continue to monitor.

## 2022-07-29 NOTE — Progress Notes (Signed)
Subjective:    Patient ID: Lauren Winters, female    DOB: 1966/02/08, 56 y.o.   MRN: [id number]  Diabetes Hypoglycemia symptoms include nervousness/anxiousness. Pertinent negatives for diabetes include no chest pain.    Lauren Winters is a very pleasant 56 y.o. female with a history of hypertension, type 2 diabetes, rheumatoid arthritis, Sjogren's syndrome, CKD stage 3, Lupus, chronic pain syndrome, depression chronic back pain who presents today for follow up type 2 diabetes and GAD.  1) Type 2 Diabetes:  Current medications include: Tresiba 45 units daily, semaglutide 1 mg daily.   She missed three doses of her Ozempic as she underwent two surgical procedures this Summer.   She accidentally stopped taking rosuvastatin   She is checking her blood glucose continuously times daily and is getting readings of low to mid 100's. She denies readings below 101.   Last A1C: 6.2 in March 2023, 6.8 today  Last Eye Exam: Due, scheduled  Last Foot Exam: UTD Pneumonia Vaccination: 2020 Urine Microalbumin: Managed on ARB. Statin: rosuvastatin   Dietary changes since last visit: She has cut out greasy fried food, is mostly using an air fryer.    Exercise: Some walking when able.   Wt Readings from Last 3 Encounters:  07/29/22 206 lb (93.4 kg)  07/20/22 204 lb 3.2 oz (92.6 kg)  07/19/22 203 lb (92.1 kg)   2) GAD: Currently managed on Lexapro 10 mg daily which was initiated in March 2023 during her last visit.  Previously managed on Zoloft which caused diarrhea and intolerance.  She was managed on Lexapro several years ago and admitted to doing well.  Since her last visit she continues to experience anxiety, there are days she can't get out of bed, but overall she feels somewhat improved with her overall mood. She does question whether she needs a dose adjustment.     Review of Systems  Respiratory:  Negative for shortness of breath.   Cardiovascular:  Negative for chest pain.   Neurological:  Positive for numbness.  Psychiatric/Behavioral:  The patient is nervous/anxious.          Past Medical History:  Diagnosis Date   Anxiety    Atypical chest pain    a. ETT 05/2014: normal stress test without evidence of myocardial ischemia or chest pain at peak stress; b. 02/2021 Cor CTA: Ca2+ score = 0. Nl cors.   Complication of anesthesia    migraine headache   Depression    Diabetes mellitus without complication ([place]) metformin   dx 2015   Dyspnea    on exertion   Fibromyalgia    Headache    Heart murmur    a. told back in 2007, but no mention with 2016 cardiac w/u; b. 02/2019 Echo: EF 60-65%, conc LVH. Nl RV fxn. Mild Ca2+ of MV. Triv AI.   Hyperlipidemia    Hypertension    Lumbar radiculopathy    Lupus (systemic lupus erythematosus) (HCC)    Mitral regurgitation    a. 05/2014 Echo: Mild MR/AI; b. 05/2019 Echo: Mild Ca2+ of MV.   Obesity (BMI 30-39.9)    Palpitations    a. 02/2021 Zio: Predominantly SR @ 75 (44-167). 8 SVT runs (max 167 bpm, longest 10 beats). ? Atrial tach. Triggered events assoc w/ SVT. Otw rare isolated PACs/PVCs. No afib/flutter.   Raynaud's phenomenon    Rheumatoid arthritis ([place])    S/P lumbar laminectomy 12/09/2017   S/P lumbar spinal fusion 05/26/2018   Sjogren's disease ([place])  Spinal stenosis    Spondylolisthesis of lumbar region    Stroke [facility])    Tobacco abuse    a. ongoing; b. ongoing SOB   Wound infection after surgery 01/11/2018    Social History   Socioeconomic History   Marital status: Married    Spouse name: Not on file   Number of children: Not on file   Years of education: Not on file   Highest education level: Not on file  Occupational History   Not on file  Tobacco Use   Smoking status: Former    Packs/day: 0.50    Years: 10.00    Total pack years: 5.00    Types: Cigarettes    Quit date: 07/22/2015    Years since quitting: 7.0    Passive exposure: Never   Smokeless tobacco: Never  Vaping Use    Vaping Use: Never used  Substance and Sexual Activity   Alcohol use: No    Alcohol/week: 0.0 standard drinks of alcohol   Drug use: No   Sexual activity: Not Currently  Other Topics Concern   Not on file  Social History Narrative   Married.   3 children.   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    Social Determinants of Health   Financial Resource Strain: Low Risk  (07/19/2022)   Overall Financial Resource Strain (CARDIA)    Difficulty of Paying Living Expenses: Not hard at all  Food Insecurity: No Food Insecurity (07/19/2022)   Hunger Vital Sign    Worried About Running Out of Food in the Last Year: Never true    Ran Out of Food in the Last Year: Never true  Transportation Needs: No Transportation Needs (07/19/2022)   PRAPARE - Hydrologist (Medical): No    Lack of Transportation (Non-Medical): No  Physical Activity: Insufficiently Active (07/19/2022)   Exercise Vital Sign    Days of Exercise per Week: 2 days    Minutes of Exercise per Session: 20 min  Stress: No Stress Concern Present (07/19/2022)   [facility]    Feeling of Stress : Not at all  Social Connections: Moderately Isolated (07/19/2022)   Social Connection and Isolation Panel [NHANES]    Frequency of Communication with Friends and Family: More than three times a week    Frequency of Social Gatherings with Friends and Family: More than three times a week    Attends Religious Services: Never    Marine scientist or Organizations: No    Attends Archivist Meetings: Never    Marital Status: Married  Human resources officer Violence: Not At Risk (07/19/2022)   Humiliation, Afraid, Rape, and Kick questionnaire    Fear of Current or Ex-Partner: No    Emotionally Abused: No    Physically Abused: No    Sexually Abused: No    Past Surgical History:  Procedure Laterality Date    ABDOMINAL HYSTERECTOMY     BACK SURGERY     BONE MARROW BIOPSY  10/2019   CESAREAN SECTION     x3   LUMBAR LAMINECTOMY     LUMBAR LAMINECTOMY/DECOMPRESSION MICRODISCECTOMY N/A 03/29/2017   Procedure: Re operative Right Lumbar Three-Four Laminectomy and discectomy with Left Lumbar Three-Four, Bilateral Lumbar Four-Five Laminectomy for decompression;  Surgeon: Kevan Ny Ditty, MD;  Location: [facility];  Service: Neurosurgery;  Laterality: N/A;   SPINAL CORD STIMULATOR INSERTION  12/09/2017   Procedure: LUMBAR  SPINAL CORD STIMULATOR IMPLANT;  Surgeon: Eustace Moore, MD;  Location: [facility];  Service: Neurosurgery;;   SPINAL CORD STIMULATOR INSERTION     06/18/2022 leads placed, 06/25/2022 battery placed   SPINAL CORD STIMULATOR INSERTION  06/25/2022   SPINAL CORD STIMULATOR REMOVAL N/A 01/11/2018   Procedure: LUMBAR SPINAL CORD STIMULATOR REMOVAL;  Surgeon: Eustace Moore, MD;  Location: [facility];  Service: Neurosurgery;  Laterality: N/A;   TUBAL LIGATION      Family History  Problem Relation Age of Onset   CAD Father 57       CABG x 4   Liver cancer Father 9       passed   Valvular heart disease Father    Hypertension Father    CAD Paternal 5        CABG   Breast cancer Paternal Aunt    CAD Paternal Aunt        CABG   Breast cancer Paternal Aunt    CAD Paternal Aunt        CABG   Breast cancer Paternal Aunt    CAD Paternal Aunt        CABG   Breast cancer Paternal Aunt    CAD Paternal Aunt        CABG   Breast cancer Paternal Aunt    CAD Paternal Uncle        MI s/p CABG   Stroke Maternal Grandmother    Stroke Maternal Grandfather    CAD Maternal Grandfather    Heart Problems Brother     Allergies  Allergen Reactions   Pseudoephedrine Hcl Shortness Of Breath and Other (See Comments)    Room starts spinning, can't breath   Ancef [Cefazolin] Other (See Comments)    Unknown   Clindamycin/Lincomycin Hives   Prednisone Other (See Comments)    Chest pain w/ cortisone  shots or prednisone pills Elevated blood pressure   Triprolidine Hcl    Adhesive [Tape] Rash and Other (See Comments)    Sensitivity to certain band aids and adhesives - burns skin, makes skin raw   Other Rash and Other (See Comments)    Sensitivity to certain band aids and adhesives - burns skin, makes skin raw   Sudafed [Pseudoephedrine Hcl] Other (See Comments)    Dizziness     Current Outpatient Medications on File Prior to Visit  Medication Sig Dispense Refill   amLODipine (NORVASC) 10 MG tablet TAKE ONE TAB DAILY FOR BLOOD PRESSURE. 90 tablet 0   Blood Glucose Monitoring Suppl ([place]) w/Device KIT Use as instructed to test blood sugar 3 times daily 1 kit 0   buPROPion (WELLBUTRIN XL) 150 MG 24 hr tablet TAKE 1 TABLET BY MOUTH ONCE DAILY FOR ANXIETY AND DEPRESSION. 90 tablet 3   clopidogrel (PLAVIX) 75 MG tablet TAKE 1 TABLET BY MOUTH EVERY DAY 90 tablet 1   Continuous Blood Gluc Receiver (FREESTYLE LIBRE 14 DAY READER) DEVI 1 Device by Does not apply route in the morning, at noon, and at bedtime. 1 each 0   Continuous Blood Gluc Sensor (FREESTYLE LIBRE 3 SENSOR) MISC 1 Device by Does not apply route continuous. Place 1 sensor on the skin every 14 days. Use to check glucose continuously 6 each 1   folic acid (FOLVITE) 1 MG tablet Take 2 tablets by mouth daily. 180 tablet 3   glucose blood test strip Use as instructed to blood sugar 3 times daily 300 each 2   hydroxychloroquine (  PLAQUENIL) 200 MG tablet Take 1 tablet (200 mg total) by mouth 2 (two) times daily. 180 tablet 0   insulin degludec (TRESIBA FLEXTOUCH) 100 UNIT/ML FlexTouch Pen Inject 45 Units into the skin daily. for diabetes. 45 mL 0   Insulin Pen Needle (PEN NEEDLES) 31G X 6 MM MISC Use with insulin as directed. 100 each 0   methotrexate (RHEUMATREX) 2.5 MG tablet Take 7 tablets (17.5 mg total) by mouth once a week. Caution:Chemotherapy. Protect from light. 28 tablet 2   metoprolol succinate (TOPROL-XL)  50 MG 24 hr tablet Take 1 tablet (50 mg total) by mouth daily. For blood pressure 90 tablet 3   ondansetron (ZOFRAN) 4 MG tablet Take 1 tablet (4 mg total) by mouth every 8 (eight) hours as needed for nausea or vomiting. 20 tablet 0   pregabalin (LYRICA) 25 MG capsule Take by mouth.     Semaglutide, 1 MG/DOSE, 4 MG/3ML SOPN Inject 1 mg as directed once a week. For diabetes. 9 mL 1   spironolactone (ALDACTONE) 25 MG tablet Take 25 mg by mouth daily.     telmisartan-hydrochlorothiazide (MICARDIS HCT) 80-25 MG tablet Take 1 tablet by mouth daily. for blood pressure. 90 tablet 1   Vitamin D, Ergocalciferol, (DRISDOL) 1.25 MG (50000 UNIT) CAPS capsule Take 1 capsule (50,000 Units total) by mouth 2 (two) times a week. 24 capsule 0   No current facility-administered medications on file prior to visit.    BP 124/82   Pulse 62   Temp 98.6 F (37 C) (Oral)   Ht 5' 6"  (1.676 m)   Wt 206 lb (93.4 kg)   SpO2 99%   BMI 33.25 kg/m  Objective:   Physical Exam Cardiovascular:     Rate and Rhythm: Normal rate and regular rhythm.  Pulmonary:     Effort: Pulmonary effort is normal.     Breath sounds: Normal breath sounds.  Musculoskeletal:     Cervical back: Neck supple.  Skin:    General: Skin is warm and dry.           Assessment & Plan:   Problem List Items Addressed This Visit       Endocrine   Type 2 diabetes mellitus with hyperglycemia, with long-term current use of insulin ([place])    Controlled with A1c of 6.8 today.  Continue semaglutide 1 mg weekly, Tresiba 45 units daily. Foot exam up-to-date. Eye exam due, she has this scheduled. Managed on statin and ARB Pneumonia vaccine up-to-date.  She will notify if she experiences episodes of hypoglycemia.   Follow-up in 6 months.      Relevant Medications   rosuvastatin (CRESTOR) 20 MG tablet   Other Relevant Orders   POCT glycosylated hemoglobin (Hb A1C) (Completed)     Other   GAD (generalized anxiety disorder)     Improved but room for improvement.  Increase Lexapro to 20 mg daily. She will update if no improvement.  Continue to monitor.       Relevant Medications   escitalopram (LEXAPRO) 20 MG tablet   Hyperlipidemia - Primary   Relevant Medications   rosuvastatin (CRESTOR) 20 MG tablet       Pleas Koch, NP

## 2022-07-29 NOTE — Patient Instructions (Signed)
We increased her dose of Lexapro to 20 mg.  Take 1 tablet mouth once daily.  Monitor your blood sugars, notify me if you experience consistently low levels below 80.  Resume rosuvastatin (Crestor) for cholesterol.  Take 1 tablet by mouth once daily.  Please schedule a physical to meet with me in 6 months. Please set up at the end of a session.  It was a pleasure to see you today!

## 2022-08-11 DIAGNOSIS — Z09 Encounter for follow-up examination after completed treatment for conditions other than malignant neoplasm: Secondary | ICD-10-CM | POA: Diagnosis not present

## 2022-08-11 DIAGNOSIS — M961 Postlaminectomy syndrome, not elsewhere classified: Secondary | ICD-10-CM | POA: Diagnosis not present

## 2022-08-18 ENCOUNTER — Ambulatory Visit
Admission: RE | Admit: 2022-08-18 | Discharge: 2022-08-18 | Disposition: A | Discharge status: Home / Self Care | Payer: Medicare Other | Source: Ambulatory Visit | Attending: Primary Care | Admitting: Primary Care

## 2022-08-18 DIAGNOSIS — Z1231 Encounter for screening mammogram for malignant neoplasm of breast: Secondary | ICD-10-CM

## 2022-08-18 DIAGNOSIS — Z780 Asymptomatic menopausal state: Secondary | ICD-10-CM | POA: Insufficient documentation

## 2022-08-25 DIAGNOSIS — M961 Postlaminectomy syndrome, not elsewhere classified: Secondary | ICD-10-CM | POA: Diagnosis not present

## 2022-08-25 DIAGNOSIS — Z09 Encounter for follow-up examination after completed treatment for conditions other than malignant neoplasm: Secondary | ICD-10-CM | POA: Diagnosis not present

## 2022-08-26 DIAGNOSIS — M5416 Radiculopathy, lumbar region: Secondary | ICD-10-CM | POA: Diagnosis not present

## 2022-09-02 DIAGNOSIS — M5416 Radiculopathy, lumbar region: Secondary | ICD-10-CM | POA: Diagnosis not present

## 2022-09-12 ENCOUNTER — Other Ambulatory Visit: Payer: Self-pay | Admitting: Primary Care

## 2022-09-12 DIAGNOSIS — I10 Essential (primary) hypertension: Secondary | ICD-10-CM

## 2022-09-13 DIAGNOSIS — M5416 Radiculopathy, lumbar region: Secondary | ICD-10-CM | POA: Diagnosis not present

## 2022-09-15 DIAGNOSIS — Z09 Encounter for follow-up examination after completed treatment for conditions other than malignant neoplasm: Secondary | ICD-10-CM | POA: Diagnosis not present

## 2022-09-15 DIAGNOSIS — M961 Postlaminectomy syndrome, not elsewhere classified: Secondary | ICD-10-CM | POA: Diagnosis not present

## 2022-09-22 ENCOUNTER — Encounter (INDEPENDENT_AMBULATORY_CARE_PROVIDER_SITE_OTHER): Payer: Medicare Other

## 2022-09-22 DIAGNOSIS — Z794 Long term (current) use of insulin: Secondary | ICD-10-CM

## 2022-09-22 DIAGNOSIS — E1165 Type 2 diabetes mellitus with hyperglycemia: Secondary | ICD-10-CM

## 2022-09-23 DIAGNOSIS — E1165 Type 2 diabetes mellitus with hyperglycemia: Secondary | ICD-10-CM | POA: Diagnosis not present

## 2022-09-23 NOTE — Telephone Encounter (Signed)
Lauren Winters, see patient's original message regarding donut hole with Ozempic.  Are there any options for her regarding discount program or coupon card?

## 2022-09-24 MED ORDER — GLIPIZIDE ER 5 MG PO TB24: 5.0000 mg | ORAL_TABLET | Freq: Every day | ORAL | 0 refills | Status: DC

## 2022-09-24 NOTE — Telephone Encounter (Signed)
Please see the MyChart message reply(ies) for my assessment and plan.  The patient gave consent for this Medical Advice Message and is aware that it may result in a bill to their insurance company as well as the possibility that this may result in a co-payment or deductible. They are an established patient, but are not seeking medical advice exclusively about a problem treated during an in person or video visit in the last 7 days. I did not recommend an in person or video visit within 7 days of my reply.  I spent a total of 22 minutes cumulative time within 7 days through [place], NP

## 2022-10-06 ENCOUNTER — Other Ambulatory Visit: Payer: Self-pay | Admitting: Physician Assistant

## 2022-10-06 NOTE — Telephone Encounter (Signed)
Next Visit: 10/21/2022  Last Visit: 07/20/2022  Last Fill: 03/01/2022  DX: Other organ or system involvement in systemic lupus erythematosus   Current Dose per office note 07/20/2022: methotrexate 7 tablets by mouth once weekly  Labs: 07/20/2022 CBC shows elevated platelets which is stable.  CMP is normal  Patient to update labs at appointment on 10/21/2022  Okay to refill MTX?

## 2022-10-08 NOTE — Progress Notes (Deleted)
Office Visit Note  Patient: Lauren Winters             Date of Birth: [birth year]           MRN: [id number]             PCP: Pleas Koch, NP Referring: Pleas Koch, NP Visit Date: 10/21/2022 Occupation: @GUAROCC @  Subjective:    History of Present Illness: ARMA REINING is a 56 y.o. female with history of systemic lupus and osteoarthritis.  Patient is currently taking Plaquenil 200 mg 1 tablet by mouth twice daily, methotrexate 7 tablets by mouth once weekly, folic acid 108m po qd.  Activities of Daily Living:  Patient reports morning stiffness for *** {minute/hour:19697}.   Patient {ACTIONS;DENIES/REPORTS:21021675::"Denies"} nocturnal pain.  Difficulty dressing/grooming: {ACTIONS;DENIES/REPORTS:21021675::"Denies"} Difficulty climbing stairs: {ACTIONS;DENIES/REPORTS:21021675::"Denies"} Difficulty getting out of chair: {ACTIONS;DENIES/REPORTS:21021675::"Denies"} Difficulty using hands for taps, buttons, cutlery, and/or writing: {ACTIONS;DENIES/REPORTS:21021675::"Denies"}  No Rheumatology ROS completed.   PMFS History:  Patient Active Problem List   Diagnosis Date Noted  . Post laminectomy syndrome 05/19/2022  . Type 2 diabetes mellitus with hyperglycemia, with long-term current use of insulin ([place] 12/08/2021  . Persistent cough for 3 weeks or longer 12/08/2021  . Encounter for pain management counseling 07/25/2021  . History of infection of neurostimulator battery w/ removal of device 06/12/2021    Class: History of  . Elevated C-reactive protein (CRP) 06/12/2021  . Elevated sed rate 06/12/2021  . DDD (degenerative disc disease), thoracic 06/12/2021  . DDD (degenerative disc disease), cervical 06/12/2021  . Osteoarthritis of hip (Right) 06/12/2021  . Other idiopathic scoliosis, thoracolumbar region 06/12/2021  . Fixation hardware in spine (L3-4 and L4-5) 06/12/2021  . Chronic pain syndrome 06/10/2021  . Pharmacologic therapy 06/10/2021  . Disorder of  skeletal system 06/10/2021  . Problems influencing health status 06/10/2021  . Vitamin D deficiency 06/10/2021  . Positive ANA (antinuclear antibody) 06/10/2021  . Elevated rheumatoid factor 06/10/2021  . Elevated d-dimer 06/10/2021  . Chronic anticoagulation (Plavix) 06/10/2021  . Chronic lower extremity pain (3ry area of Pain) (Right) 06/10/2021  . Diabetic peripheral neuropathy (4th area of Pain) ([place] 06/10/2021  . Chronic, intermittent, rib pain (8th area of Pain) (Bilateral) 06/10/2021  . Thoracic radiculitis (T6) (intermittent) (Bilateral) 06/10/2021  . Chronic neck pain (6th area of Pain) (posterior) (Midline) (R>L) 06/10/2021  . Cervicalgia 06/10/2021  . Chronic upper extremity pain (7th area of Pain) (Right) 06/10/2021  . Cough 01/14/2021  . Adenopathy, cervical 08/18/2020  . Monoclonal gammopathy 05/14/2020  . Bilateral primary osteoarthritis of hip 10/19/2019  . Paresthesias 03/02/2019  . Chronic hand pain (Bilateral) 12/06/2018  . Headache disorder 12/06/2018  . Left arm weakness 10/11/2018  . Numbness and tingling in left arm 10/11/2018  . Essential hypertension 10/06/2018  . 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[place] 10/06/2018  . Stage 3 chronic kidney disease ([facility] 10/06/2018  . History of stroke 08/19/2018  . History of lumbar spinal fusion (L3-4, L4-5) 05/26/2018  . Local infection of wound 02/07/2018  . High risk medication use 02/03/2018  . Trochanteric bursitis 06/09/2017  . Lumbar post-laminectomy syndrome (L3-4, L4-5) 06/09/2017  . Rheumatoid arthritis involving multiple sites with positive rheumatoid factor ([place] 06/09/2017  . HNP (herniated nucleus pulposus), lumbar 03/29/2017  . 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 Chronic shoulder pain (Right) 02/18/2017  . Herniation of nucleus pulposus of cervical intervertebral disc without myelopathy 02/09/2017  . Lumbosacral spondylosis with radiculopathy 01/21/2017  . Primary osteoarthritis of  both hands  12/22/2016  . Primary osteoarthritis of both knees 12/22/2016  . Spondylosis of lumbar region without myelopathy or radiculopathy 12/22/2016  . Fibromyalgia 12/22/2016  . Preventative health care 04/19/2016  . Chronic hip pain (2ry area of Pain) (Bilateral) 03/09/2016  . Disorder of sacroiliac joint 02/23/2016  . Chronic low back pain (1ry area of Pain) (Bilateral) (R>L) w/o sciatica 02/04/2016  . Chronic mid back pain (5th area of Pain) (Bilateral) 12/17/2015  . Displacement of lumbar intervertebral disc without myelopathy 10/29/2015  . Hyperlipidemia 08/29/2015  . Lupus ([place]) 08/04/2015  . Rheumatoid factor positive 08/04/2015  . Sjogren's syndrome ([place]) 08/04/2015  . Mitral regurgitation   . Atypical chest pain   . Obesity (BMI 30-39.9)   . GAD (generalized anxiety disorder)   . Depression 05/23/2014  . Chest pain 05/23/2014    Past Medical History:  Diagnosis Date  . Anxiety   . Atypical chest pain    a. ETT 05/2014: normal stress test without evidence of myocardial ischemia or chest pain at peak stress; b. 02/2021 Cor CTA: Ca2+ score = 0. Nl cors.  . Complication of anesthesia    migraine headache  . Depression   . Diabetes mellitus without complication ([place]) metformin   dx 2015  . Dyspnea    on exertion  . Fibromyalgia   . Headache   . Heart murmur    a. told back in 2007, but no mention with 2016 cardiac w/u; b. 02/2019 Echo: EF 60-65%, conc LVH. Nl RV fxn. Mild Ca2+ of MV. Triv AI.  Marland Kitchen Hyperlipidemia   . Hypertension   . Lumbar radiculopathy   . Lupus (systemic lupus erythematosus) ([place])   . Mitral regurgitation    a. 05/2014 Echo: Mild MR/AI; b. 05/2019 Echo: Mild Ca2+ of MV.  . Obesity (BMI 30-39.9)   . Palpitations    a. 02/2021 Zio: Predominantly SR @ 75 (44-167). 8 SVT runs (max 167 bpm, longest 10 beats). ? Atrial tach. Triggered events assoc w/ SVT. Otw rare isolated PACs/PVCs. No afib/flutter.  . Raynaud's phenomenon   . Rheumatoid arthritis ([place])    . S/P lumbar laminectomy 12/09/2017  . S/P lumbar spinal fusion 05/26/2018  . Sjogren's disease ([place])   . Spinal stenosis   . Spondylolisthesis of lumbar region   . Stroke ([place])   . Tobacco abuse    a. ongoing; b. ongoing SOB  . Wound infection after surgery 01/11/2018    Family History  Problem Relation Age of Onset  . CAD Father 65       CABG x 4  . Liver cancer Father 5       passed  . Valvular heart disease Father   . Hypertension Father   . CAD Paternal Aunt        CABG  . Breast cancer Paternal Aunt   . CAD Paternal Aunt        CABG  . Breast cancer Paternal Aunt   . CAD Paternal Aunt        CABG  . Breast cancer Paternal Aunt   . CAD Paternal Aunt        CABG  . Breast cancer Paternal Aunt   . CAD Paternal Aunt        CABG  . Breast cancer Paternal Aunt   . CAD Paternal Uncle        MI s/p CABG  . Stroke Maternal Grandmother   . Stroke Maternal Grandfather   . CAD Maternal Grandfather   . Heart  Problems Brother    Past Surgical History:  Procedure Laterality Date  . ABDOMINAL HYSTERECTOMY    . BACK SURGERY    . BONE MARROW BIOPSY  10/2019  . CESAREAN SECTION     x3  . LUMBAR LAMINECTOMY    . LUMBAR LAMINECTOMY/DECOMPRESSION MICRODISCECTOMY N/A 03/29/2017   Procedure: Re operative Right Lumbar Three-Four Laminectomy and discectomy with Left Lumbar Three-Four, Bilateral Lumbar Four-Five Laminectomy for decompression;  Surgeon: Kevan Ny Ditty, MD;  Location: [facility];  Service: Neurosurgery;  Laterality: N/A;  . SPINAL CORD STIMULATOR INSERTION  12/09/2017   Procedure: LUMBAR SPINAL CORD STIMULATOR IMPLANT;  Surgeon: Eustace Moore, MD;  Location: [facility];  Service: Neurosurgery;;  . SPINAL CORD STIMULATOR INSERTION     06/18/2022 leads placed, 06/25/2022 battery placed  . SPINAL CORD STIMULATOR INSERTION  06/25/2022  . SPINAL CORD STIMULATOR REMOVAL N/A 01/11/2018   Procedure: LUMBAR SPINAL CORD STIMULATOR REMOVAL;  Surgeon: Eustace Moore, MD;   Location: [facility];  Service: Neurosurgery;  Laterality: N/A;  . TUBAL LIGATION     Social History   Social History Narrative   Married.   3 children.   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    Immunization History  Administered Date(s) Administered  . Influenza,inj,Quad PF,6+ Mos 10/19/2016, 10/21/2017, 01/31/2019, 10/19/2019, 12/08/2021  . PFIZER(Purple Top)SARS-COV-2 Vaccination 04/17/2020, 05/08/2020  . Pneumococcal Polysaccharide-23 10/27/2014, 06/25/2019  . Td 04/19/2016  . Zoster Recombinat (Shingrix) 03/26/2022  . Zoster, Live 10/27/2014     Objective: Vital Signs: There were no vitals taken for this visit.   Physical Exam Vitals and nursing note reviewed.  Constitutional:      Appearance: She is well-developed.  HENT:     Head: Normocephalic and atraumatic.  Eyes:     Conjunctiva/sclera: Conjunctivae normal.  Cardiovascular:     Rate and Rhythm: Normal rate and regular rhythm.     Heart sounds: Normal heart sounds.  Pulmonary:     Effort: Pulmonary effort is normal.     Breath sounds: Normal breath sounds.  Abdominal:     General: Bowel sounds are normal.     Palpations: Abdomen is soft.  Musculoskeletal:     Cervical back: Normal range of motion.  Skin:    General: Skin is warm and dry.     Capillary Refill: Capillary refill takes less than 2 seconds.  Neurological:     Mental Status: She is alert and oriented to person, place, and time.  Psychiatric:        Behavior: Behavior normal.     Musculoskeletal Exam: ***  CDAI Exam: CDAI Score: -- Patient Global: --; Provider Global: -- Swollen: --; Tender: -- Joint Exam 10/21/2022   No joint exam has been documented for this visit   There is currently no information documented on the homunculus. Go to the Rheumatology activity and complete the homunculus joint exam.  Investigation: No additional findings.  Imaging: No results found.  Recent Labs: Lab Results   Component Value Date   WBC 9.0 07/20/2022   HGB 14.4 07/20/2022   PLT 476 (H) 07/20/2022   NA 138 07/20/2022   K 3.7 07/20/2022   CL 98 07/20/2022   CO2 29 07/20/2022   GLUCOSE 116 (H) 07/20/2022   BUN 12 07/20/2022   CREATININE 0.95 07/20/2022   BILITOT 0.5 07/20/2022   ALKPHOS 102 06/10/2021   AST 12 07/20/2022   ALT 10 07/20/2022   PROT 8.2 (H) 07/20/2022   ALBUMIN  4.4 06/10/2021   CALCIUM 9.6 07/20/2022   GFRAA 103 10/20/2020    Speciality Comments: PLQ Eye Exam: 10/24/2020 WNL @ [facility] Follow up in 1 year  Procedures:  No procedures performed Allergies: Pseudoephedrine hcl, Ancef [cefazolin], Clindamycin/lincomycin, Prednisone, Triprolidine hcl, Adhesive [tape], Other, and Sudafed [pseudoephedrine hcl]   Assessment / Plan:     Visit Diagnoses: Other organ or system involvement in systemic lupus erythematosus ([place])  High risk medication use  Raynaud's syndrome without gangrene  Primary osteoarthritis of both hands  Primary osteoarthritis of both knees  Trochanteric bursitis of both hips  S/P lumbar laminectomy  Fibromyalgia  History of CVA (cerebrovascular accident)  Mitral valve insufficiency, unspecified etiology  Essential hypertension  Diabetes mellitus without complication ([place])  Vitamin D deficiency  Orders: No orders of the defined types were placed in this encounter.  No orders of the defined types were placed in this encounter.   Face-to-face time spent with patient was *** minutes. Greater than 50% of time was spent in counseling and coordination of care.  Follow-Up Instructions: No follow-ups on file.   Ofilia Neas, PA-C  Note - This record has been created using Dragon software.  Chart creation errors have been sought, but may not always  have been located. Such creation errors do not reflect on  the standard of medical care.

## 2022-10-12 ENCOUNTER — Other Ambulatory Visit: Payer: Self-pay | Admitting: Rheumatology

## 2022-10-12 NOTE — Telephone Encounter (Signed)
Patient reports fasting sugars running over 200 for the last several days. Non fasting anywhere from 300-400 throughout the day. Today is the first day her sugar was over 400.  She states she is taking the glipizide as directed and cannot get her number under 200. No changes in diet. Please advise.

## 2022-10-12 NOTE — Telephone Encounter (Signed)
Noted, see other MyChart message

## 2022-10-13 NOTE — Telephone Encounter (Signed)
Next Visit: 10/21/2022   Last Visit: 07/20/2022   Last Fill: 01/12/2022  DX: Other organ or system involvement in systemic lupus erythematosus    Current Dose per office note 07/20/2022:  Plaquenil 200 mg 1 tablet by mouth twice daily  Labs: 07/20/2022 CBC shows elevated platelets which is stable.  CMP is normal   PLQ Eye Exam: 10/24/2020 WNL   Left message to advise patient she is due to update her PLQ eye exam  Okay to refill PLQ?

## 2022-10-13 NOTE — Telephone Encounter (Signed)
We cannot refill hydroxychloroquine until she has an eye examination.  Defy patient that she can have a flare without taking hydroxychloroquine.

## 2022-10-14 DIAGNOSIS — M5416 Radiculopathy, lumbar region: Secondary | ICD-10-CM | POA: Diagnosis not present

## 2022-10-18 ENCOUNTER — Encounter: Payer: Self-pay | Admitting: Rheumatology

## 2022-10-20 DIAGNOSIS — M961 Postlaminectomy syndrome, not elsewhere classified: Secondary | ICD-10-CM | POA: Diagnosis not present

## 2022-10-21 ENCOUNTER — Ambulatory Visit: Payer: Medicare Other | Admitting: Physician Assistant

## 2022-10-21 DIAGNOSIS — M3219 Other organ or system involvement in systemic lupus erythematosus: Secondary | ICD-10-CM

## 2022-10-21 DIAGNOSIS — Z9889 Other specified postprocedural states: Secondary | ICD-10-CM

## 2022-10-21 DIAGNOSIS — Z79899 Other long term (current) drug therapy: Secondary | ICD-10-CM

## 2022-10-21 DIAGNOSIS — M19042 Primary osteoarthritis, left hand: Secondary | ICD-10-CM

## 2022-10-21 DIAGNOSIS — M170 Bilateral primary osteoarthritis of knee: Secondary | ICD-10-CM

## 2022-10-21 DIAGNOSIS — Z8673 Personal history of transient ischemic attack (TIA), and cerebral infarction without residual deficits: Secondary | ICD-10-CM

## 2022-10-21 DIAGNOSIS — I10 Essential (primary) hypertension: Secondary | ICD-10-CM

## 2022-10-21 DIAGNOSIS — E559 Vitamin D deficiency, unspecified: Secondary | ICD-10-CM

## 2022-10-21 DIAGNOSIS — Z13820 Encounter for screening for osteoporosis: Secondary | ICD-10-CM

## 2022-10-21 DIAGNOSIS — E119 Type 2 diabetes mellitus without complications: Secondary | ICD-10-CM

## 2022-10-21 DIAGNOSIS — I340 Nonrheumatic mitral (valve) insufficiency: Secondary | ICD-10-CM

## 2022-10-21 DIAGNOSIS — M7061 Trochanteric bursitis, right hip: Secondary | ICD-10-CM

## 2022-10-21 DIAGNOSIS — I7300 Raynaud's syndrome without gangrene: Secondary | ICD-10-CM

## 2022-10-21 DIAGNOSIS — M797 Fibromyalgia: Secondary | ICD-10-CM

## 2022-10-21 NOTE — Progress Notes (Unsigned)
Office Visit Note  Patient: Lauren Winters             Date of Birth: 01-14-1966           MRN: [id number]             PCP: Pleas Koch, NP Referring: Pleas Koch, NP Visit Date: 11/02/2022 Occupation: @GUAROCC @  Subjective:  Fatigue   History of Present Illness: SHAYLE DONAHOO is a 56 y.o. female with history of systemic lupus erythematosus, osteoarthritis, and fibromyalgia.  Patient remains on methotrexate 7 tablets by mouth once weekly and folic acid 2mg  po qd. She has been off of Plaquenil for the past 1 month due to requiring an updated Plaquenil eye examination.  She is scheduled to have a Plaquenil eye examination on 11/17/2022.  Over the past several weeks she has been experiencing intermittent chills, fatigue, body aches, headaches, and sweating.  She states that it feels as though she has the flu or is febrile but when she checks her temperature it is within a normal range.  She has not had any other signs or symptoms of an infection.  She is concerned that she may be having a lupus flare.  She states that the symptoms have not been responsive to taking Tylenol.  She is also noticed some increased mouth sores but denies any nasal ulcers.  She has not had any rashes recently.  She has intermittent symptoms of Raynaud's especially with the cold weather temperatures. She continues to have persistent lower back pain.  She states that she is in need of a new spinal cord stimulator and is hoping to have the procedure performed later this month.  She continues to use a cane to assist with ambulation.  She states her mobility has been significantly limited since her spinal cord stimulator has been malfunctioning. She continues to have chronic pain in both hands but denies any joint swelling.    Activities of Daily Living:  Patient reports morning stiffness for several hours.   Patient Reports nocturnal pain.  Difficulty dressing/grooming: Reports Difficulty climbing  stairs: Reports Difficulty getting out of chair: Reports Difficulty using hands for taps, buttons, cutlery, and/or writing: Reports  Review of Systems  Constitutional:  Positive for fatigue.  HENT:  Positive for mouth sores. Negative for mouth dryness.   Eyes:  Positive for dryness.  Respiratory:  Positive for shortness of breath.   Cardiovascular:  Positive for palpitations. Negative for chest pain.  Gastrointestinal:  Negative for blood in stool, constipation and diarrhea.  Endocrine: Negative for increased urination.  Genitourinary:  Negative for involuntary urination.  Musculoskeletal:  Positive for joint pain, gait problem, joint pain, myalgias, muscle weakness, morning stiffness, muscle tenderness and myalgias. Negative for joint swelling.  Skin:  Negative for color change, rash, hair loss and sensitivity to sunlight.  Allergic/Immunologic: Negative for susceptible to infections.  Neurological:  Positive for numbness and headaches. Negative for dizziness.  Hematological:  Positive for swollen glands.  Psychiatric/Behavioral:  Positive for depressed mood and sleep disturbance. The patient is nervous/anxious.     PMFS History:  Patient Active Problem List   Diagnosis Date Noted   Post laminectomy syndrome 05/19/2022   Type 2 diabetes mellitus with hyperglycemia, with long-term current use of insulin ([place]) 12/08/2021   Persistent cough for 3 weeks or longer 12/08/2021   Encounter for pain management counseling 07/25/2021   History of infection of neurostimulator battery w/ removal of device 06/12/2021    Class: History  of   Elevated C-reactive protein (CRP) 06/12/2021   Elevated sed rate 06/12/2021   DDD (degenerative disc disease), thoracic 06/12/2021   DDD (degenerative disc disease), cervical 06/12/2021   Osteoarthritis of hip (Right) 06/12/2021   Other idiopathic scoliosis, thoracolumbar region 06/12/2021   Fixation hardware in spine (L3-4 and L4-5) 06/12/2021   Chronic  pain syndrome 06/10/2021   Pharmacologic therapy 06/10/2021   Disorder of skeletal system 06/10/2021   Problems influencing health status 06/10/2021   Vitamin D deficiency 06/10/2021   Positive ANA (antinuclear antibody) 06/10/2021   Elevated rheumatoid factor 06/10/2021   Elevated d-dimer 06/10/2021   Chronic anticoagulation (Plavix) 06/10/2021   Chronic lower extremity pain (3ry area of Pain) (Right) 06/10/2021   Diabetic peripheral neuropathy (4th area of Pain) ([place]) 06/10/2021   Chronic, intermittent, rib pain (8th area of Pain) (Bilateral) 06/10/2021   Thoracic radiculitis (T6) (intermittent) (Bilateral) 06/10/2021   Chronic neck pain (6th area of Pain) (posterior) (Midline) (R>L) 06/10/2021   Cervicalgia 06/10/2021   Chronic upper extremity pain (7th area of Pain) (Right) 06/10/2021   Cough 01/14/2021   Adenopathy, cervical 08/18/2020   Monoclonal gammopathy 05/14/2020   Bilateral primary osteoarthritis of hip 10/19/2019   Paresthesias 03/02/2019   Chronic hand pain (Bilateral) 12/06/2018   Headache disorder 12/06/2018   Left arm weakness 10/11/2018   Numbness and tingling in left arm 10/11/2018   Essential hypertension 10/06/2018   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[place]) 10/06/2018   Stage 3 chronic kidney disease ([place]) 10/06/2018   History of stroke 08/19/2018   History of lumbar spinal fusion (L3-4, L4-5) 05/26/2018   Local infection of wound 02/07/2018   High risk medication use 02/03/2018   Trochanteric bursitis 06/09/2017   Lumbar post-laminectomy syndrome (L3-4, L4-5) 06/09/2017   Rheumatoid arthritis involving multiple sites with positive rheumatoid factor ([place]) 06/09/2017   HNP (herniated nucleus pulposus), lumbar 03/29/2017   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   Chronic shoulder pain (Right) 02/18/2017   Herniation of nucleus pulposus of cervical intervertebral disc without myelopathy 02/09/2017   Lumbosacral spondylosis with radiculopathy  01/21/2017   Primary osteoarthritis of both hands 12/22/2016   Primary osteoarthritis of both knees 12/22/2016   Spondylosis of lumbar region without myelopathy or radiculopathy 12/22/2016   Fibromyalgia 12/22/2016   Preventative health care 04/19/2016   Chronic hip pain (2ry area of Pain) (Bilateral) 03/09/2016   Disorder of sacroiliac joint 02/23/2016   Chronic low back pain (1ry area of Pain) (Bilateral) (R>L) w/o sciatica 02/04/2016   Chronic mid back pain (5th area of Pain) (Bilateral) 12/17/2015   Displacement of lumbar intervertebral disc without myelopathy 10/29/2015   Hyperlipidemia 08/29/2015   Lupus ([place]) 08/04/2015   Rheumatoid factor positive 08/04/2015   Sjogren's syndrome ([place]) 08/04/2015   Mitral regurgitation    Atypical chest pain    Obesity (BMI 30-39.9)    GAD (generalized anxiety disorder)    Depression 05/23/2014   Chest pain 05/23/2014    Past Medical History:  Diagnosis Date   Anxiety    Atypical chest pain    a. ETT 05/2014: normal stress test without evidence of myocardial ischemia or chest pain at peak stress; b. 02/2021 Cor CTA: Ca2+ score = 0. Nl cors.   Complication of anesthesia    migraine headache   Depression    Diabetes mellitus without complication ([place]) metformin   dx 2015   Dyspnea    on exertion   Fibromyalgia    Headache    Heart murmur  a. told back in 2007, but no mention with 2016 cardiac w/u; b. 02/2019 Echo: EF 60-65%, conc LVH. Nl RV fxn. Mild Ca2+ of MV. Triv AI.   Hyperlipidemia    Hypertension    Lumbar radiculopathy    Lupus (systemic lupus erythematosus) (HCC)    Mitral regurgitation    a. 05/2014 Echo: Mild MR/AI; b. 05/2019 Echo: Mild Ca2+ of MV.   Obesity (BMI 30-39.9)    Palpitations    a. 02/2021 Zio: Predominantly SR @ 75 (44-167). 8 SVT runs (max 167 bpm, longest 10 beats). ? Atrial tach. Triggered events assoc w/ SVT. Otw rare isolated PACs/PVCs. No afib/flutter.   Raynaud's phenomenon    Rheumatoid arthritis ([place])     S/P lumbar laminectomy 12/09/2017   S/P lumbar spinal fusion 05/26/2018   Sjogren's disease ([place])    Spinal stenosis    Spondylolisthesis of lumbar region    Stroke [facility])    Tobacco abuse    a. ongoing; b. ongoing SOB   Wound infection after surgery 01/11/2018    Family History  Problem Relation Age of Onset   CAD Father 30       CABG x 4   Liver cancer Father 67       passed   Valvular heart disease Father    Hypertension Father    CAD Paternal 8        CABG   Breast cancer Paternal Aunt    CAD Paternal Aunt        CABG   Breast cancer Paternal Aunt    CAD Paternal 17        CABG   Breast cancer Paternal Aunt    CAD Paternal 34        CABG   Breast cancer Paternal Aunt    CAD Paternal Aunt        CABG   Breast cancer Paternal Aunt    CAD Paternal Uncle        MI s/p CABG   Stroke Maternal Grandmother    Stroke Maternal Grandfather    CAD Maternal Grandfather    Heart Problems Brother    Past Surgical History:  Procedure Laterality Date   ABDOMINAL HYSTERECTOMY     BACK SURGERY     BONE MARROW BIOPSY  10/2019   CESAREAN SECTION     x3   LUMBAR LAMINECTOMY     LUMBAR LAMINECTOMY/DECOMPRESSION MICRODISCECTOMY N/A 03/29/2017   Procedure: Re operative Right Lumbar Three-Four Laminectomy and discectomy with Left Lumbar Three-Four, Bilateral Lumbar Four-Five Laminectomy for decompression;  Surgeon: Kevan Ny Ditty, MD;  Location: [place];  Service: Neurosurgery;  Laterality: N/A;   SPINAL CORD STIMULATOR INSERTION  12/09/2017   Procedure: LUMBAR SPINAL CORD STIMULATOR IMPLANT;  Surgeon: Eustace Moore, MD;  Location: [place];  Service: Neurosurgery;;   SPINAL CORD STIMULATOR INSERTION     06/18/2022 leads placed, 06/25/2022 battery placed   SPINAL CORD STIMULATOR INSERTION  06/25/2022   SPINAL CORD STIMULATOR REMOVAL N/A 01/11/2018   Procedure: LUMBAR SPINAL CORD STIMULATOR REMOVAL;  Surgeon: Eustace Moore, MD;  Location: [place];  Service: Neurosurgery;   Laterality: N/A;   TUBAL LIGATION     Social History   Social History Narrative   Married.   3 children.   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    Immunization History  Administered Date(s) Administered   Influenza,inj,Quad PF,6+ Mos 10/19/2016, 10/21/2017, 01/31/2019, 10/19/2019, 12/08/2021   PFIZER(Purple Top)SARS-COV-2 Vaccination 04/17/2020,  05/08/2020   Pneumococcal Polysaccharide-23 10/27/2014, 06/25/2019   Td 04/19/2016   Zoster Recombinat (Shingrix) 03/26/2022   Zoster, Live 10/27/2014     Objective: Vital Signs: BP 132/84 (BP Location: Right Arm, Patient Position: Sitting, Cuff Size: Normal)   Pulse 94   Resp 17   Ht _0  (1.676 m)   Wt 212 lb 6.4 oz (96.3 kg)   BMI 34.28 kg/m    Physical Exam Vitals and nursing note reviewed.  Constitutional:      Appearance: She is well-developed.  HENT:     Head: Normocephalic and atraumatic.  Eyes:     Conjunctiva/sclera: Conjunctivae normal.  Cardiovascular:     Rate and Rhythm: Normal rate and regular rhythm.     Heart sounds: Normal heart sounds.  Pulmonary:     Effort: Pulmonary effort is normal.     Breath sounds: Normal breath sounds.  Abdominal:     General: Bowel sounds are normal.     Palpations: Abdomen is soft.  Musculoskeletal:     Cervical back: Normal range of motion.  Skin:    General: Skin is warm and dry.     Capillary Refill: Capillary refill takes less than 2 seconds.  Neurological:     Mental Status: She is alert and oriented to person, place, and time.  Psychiatric:        Behavior: Behavior normal.      Musculoskeletal Exam: Patient remained in the seated position during the examination today.  C-spine has limited range of motion.  Difficult to assess lumbar range of motion due to the severity of pain due to remaining seated. Shoulder joints, elbow joints, wrist joints, MCPs, PIPs, and DIPs good ROM with no synovitis.  Complete fist formation  bilaterally.  Hip joints difficult to assess in seated position.  Knee joints have good ROM with no warmth or effusion.  Ankle joints have good ROM with no tenderness or joint swelling.   CDAI Exam: CDAI Score: -- Patient Global: --; Provider Global: -- Swollen: --; Tender: -- Joint Exam 11/02/2022   No joint exam has been documented for this visit   There is currently no information documented on the homunculus. Go to the Rheumatology activity and complete the homunculus joint exam.  Investigation: No additional findings.  Imaging: No results found.  Recent Labs: Lab Results  Component Value Date   WBC 9.0 07/20/2022   HGB 14.4 07/20/2022   PLT 476 (H) 07/20/2022   NA 138 07/20/2022   K 3.7 07/20/2022   CL 98 07/20/2022   CO2 29 07/20/2022   GLUCOSE 116 (H) 07/20/2022   BUN 12 07/20/2022   CREATININE 0.95 07/20/2022   BILITOT 0.5 07/20/2022   ALKPHOS 102 06/10/2021   AST 12 07/20/2022   ALT 10 07/20/2022   PROT 8.2 (H) 07/20/2022   ALBUMIN 4.4 06/10/2021   CALCIUM 9.6 07/20/2022   GFRAA 103 10/20/2020    Speciality Comments: PLQ Eye Exam: 10/24/2020 WNL @ [facility] Follow up in 1 year Upcoming appt November 22,2023 at 9:20 am  Procedures:  No procedures performed Allergies: Pseudoephedrine hcl, Ancef [cefazolin], Clindamycin/lincomycin, Prednisone, Triprolidine hcl, Adhesive [tape], Other, and Sudafed [pseudoephedrine hcl]     Assessment / Plan:     Visit Diagnoses: Other organ or system involvement in systemic lupus erythematosus ([place]) - +La, oral ulcers, malar rash, fatigue, photosensitivity, Raynaud's, Subcutaneous lupus-bx+, hair loss, +RF, sicca symptoms: Patient has been out of her prescription for Plaquenil for the past 1 month due  to requiring an updated Plaquenil eye examination.  She continues to take methotrexate 7 tablets by mouth once weekly and folic acid 2 mg daily.  Over the past several weeks she has been experiencing episodic headaches,  body aches, and excessive sweating.  According to the patient the symptoms feel as though she has the flu but she has been afebrile and has not had any other signs or symptoms of an infection.  She is concerned that her symptoms are due to a systemic lupus flare since she has been off of Plaquenil.  She has also had an increased frequency of mouth ulcers but denies any nasal ulcers.  She has not had any recent rashes or increased photosensitivity.  She experiences intermittent symptoms of Raynaud's but has not had any digital ulcerations or signs of gangrene. Lab work from 07/20/2022 was reviewed today in the office: Double-stranded DNA negative, complements within normal limits, ESR within normal limits.  The following lab work will be obtained today for further evaluation.   She will remain on the current treatment regimen.  A refill of Plaquenil was sent to the pharmacy today.  She was advised to notify us if she develops signs or symptoms of a systemic lupus flare.  She will follow-up in the office in 3 months or sooner if needed.  - Plan: CBC with Differential/Platelet, COMPLETE METABOLIC PANEL WITH GFR, ANA, Anti-DNA antibody, double-stranded, C3 and C4, Sedimentation rate, Protein / creatinine ratio, urine  High risk medication use - Plaquenil 200 mg 1 tablet by mouth twice daily, methotrexate 7 tablets by mouth once weekly, folic acid 31m po qd. CBC and CMP were drawn on 07/20/2022.  Orders for CBC and CMP were released. Discussed the importance of holding methotrexate if she develops signs or symptoms of an infection and to resume once the infection has completely cleared. Scheduled to update Plaquenil eye examination on 11/17/2022.  Patient was given a Plaquenil eye examination form to take with her to her upcoming appointment.  - Plan: CBC with Differential/Platelet, COMPLETE METABOLIC PANEL WITH GFR  Raynaud's syndrome without gangrene: Intermittent.  No digital ulcerations or signs of gangrene.    Primary osteoarthritis of both hands: She continues to experience chronic pain in both hands.  She has no synovitis or dactylitis on examination.  Complete fist formation bilaterally.  Discussed that if her symptoms persist or worsen I would recommend scheduling an ultrasound of both hands to assess for synovitis.  Primary osteoarthritis of both knees - SynviscOne injections in bilateral knee joints on 03/03/2020. She has good ROM of both knee joints.  No warmth or effusion noted today.  Using cane to assist with ambulation.   Trochanteric bursitis of both hips: Chronic pain.   S/P lumbar laminectomy: Chronic pain.  Using a cane to assist with ambulation.   Fibromyalgia: She has generalized hyperalgesia and positive tender points on examination.  She continues to experience intermittent myalgias and muscle tenderness due to fibromyalgia.  She remains sedentary due to the severity of pain in her lower back.  She is hoping to increase her mobility once she has a new spinal cord stimulator placed.  Osteoporosis screening - DEXA scan on 05/04/17 was normal done by her PCP.  DEXA updated on 8/23.23: the BMD measured at Femur Neck Left is 0.962 g/cm2 with a T-score of -0.5.   Vitamin D deficiency -vitamin D was 12 on 07/20/2022.  The prescription for vitamin D 50,000 units twice weekly was sent to the pharmacy which she  has taken for the past 12 weeks.  Vitamin D level will be rechecked today. 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  Mitral valve insufficiency, unspecified etiology  Diabetes mellitus without complication ([place]) -Patient requested to have hemoglobin A1c rechecked today.  Plan: HgB A1c  Essential hypertension: BP was 132/84 today in the office.   Former smoker  Orders: Orders Placed This Encounter  Procedures   CBC with  Differential/Platelet   COMPLETE METABOLIC PANEL WITH GFR   ANA   Anti-DNA antibody, double-stranded   C3 and C4   Sedimentation rate   Protein / creatinine ratio, urine   HgB A1c   VITAMIN D 25 Hydroxy (Vit-D Deficiency, Fractures)   Meds ordered this encounter  Medications   hydroxychloroquine (PLAQUENIL) 200 MG tablet    Sig: Take 1 tablet (200 mg total) by mouth 2 (two) times daily.    Dispense:  180 tablet    Refill:  0      Follow-Up Instructions: Return in about 3 months (around 02/02/2023) for Systemic lupus erythematosus, Osteoarthritis.   Ofilia Neas, PA-C  Note - This record has been created using Dragon software.  Chart creation errors have been sought, but may not always  have been located. Such creation errors do not reflect on  the standard of medical care.

## 2022-10-23 DIAGNOSIS — E1165 Type 2 diabetes mellitus with hyperglycemia: Secondary | ICD-10-CM | POA: Diagnosis not present

## 2022-10-28 DIAGNOSIS — M961 Postlaminectomy syndrome, not elsewhere classified: Secondary | ICD-10-CM | POA: Diagnosis not present

## 2022-11-01 ENCOUNTER — Telehealth: Payer: Medicare Other | Admitting: Primary Care

## 2022-11-01 ENCOUNTER — Telehealth (INDEPENDENT_AMBULATORY_CARE_PROVIDER_SITE_OTHER): Payer: Medicare Other | Admitting: Primary Care

## 2022-11-01 ENCOUNTER — Encounter: Payer: Self-pay | Admitting: Primary Care

## 2022-11-01 VITALS — BP 157/69 | Ht 66.0 in | Wt 216.0 lb

## 2022-11-01 DIAGNOSIS — M961 Postlaminectomy syndrome, not elsewhere classified: Secondary | ICD-10-CM

## 2022-11-01 DIAGNOSIS — E1140 Type 2 diabetes mellitus with diabetic neuropathy, unspecified: Secondary | ICD-10-CM | POA: Diagnosis not present

## 2022-11-01 DIAGNOSIS — E1165 Type 2 diabetes mellitus with hyperglycemia: Secondary | ICD-10-CM | POA: Diagnosis not present

## 2022-11-01 DIAGNOSIS — Z87891 Personal history of nicotine dependence: Secondary | ICD-10-CM | POA: Diagnosis not present

## 2022-11-01 DIAGNOSIS — Z981 Arthrodesis status: Secondary | ICD-10-CM | POA: Diagnosis not present

## 2022-11-01 DIAGNOSIS — M4727 Other spondylosis with radiculopathy, lumbosacral region: Secondary | ICD-10-CM

## 2022-11-01 DIAGNOSIS — M797 Fibromyalgia: Secondary | ICD-10-CM

## 2022-11-01 DIAGNOSIS — Z9689 Presence of other specified functional implants: Secondary | ICD-10-CM | POA: Diagnosis not present

## 2022-11-01 DIAGNOSIS — Z79899 Other long term (current) drug therapy: Secondary | ICD-10-CM | POA: Diagnosis not present

## 2022-11-01 DIAGNOSIS — M0579 Rheumatoid arthritis with rheumatoid factor of multiple sites without organ or systems involvement: Secondary | ICD-10-CM

## 2022-11-01 DIAGNOSIS — M5136 Other intervertebral disc degeneration, lumbar region: Secondary | ICD-10-CM | POA: Diagnosis not present

## 2022-11-01 DIAGNOSIS — Z79621 Long term (current) use of calcineurin inhibitor: Secondary | ICD-10-CM | POA: Diagnosis not present

## 2022-11-01 DIAGNOSIS — T85192A Other mechanical complication of implanted electronic neurostimulator (electrode) of spinal cord, initial encounter: Secondary | ICD-10-CM | POA: Diagnosis not present

## 2022-11-01 DIAGNOSIS — Z794 Long term (current) use of insulin: Secondary | ICD-10-CM | POA: Diagnosis not present

## 2022-11-01 DIAGNOSIS — M40204 Unspecified kyphosis, thoracic region: Secondary | ICD-10-CM | POA: Diagnosis not present

## 2022-11-01 DIAGNOSIS — M5134 Other intervertebral disc degeneration, thoracic region: Secondary | ICD-10-CM | POA: Diagnosis not present

## 2022-11-01 DIAGNOSIS — Z7982 Long term (current) use of aspirin: Secondary | ICD-10-CM | POA: Diagnosis not present

## 2022-11-01 DIAGNOSIS — M5030 Other cervical disc degeneration, unspecified cervical region: Secondary | ICD-10-CM | POA: Diagnosis not present

## 2022-11-01 DIAGNOSIS — I10 Essential (primary) hypertension: Secondary | ICD-10-CM | POA: Diagnosis not present

## 2022-11-01 NOTE — Progress Notes (Signed)
Patient ID: Lauren Winters, female    DOB: November 03, 1966, 56 y.o.   MRN: [id number]  Virtual visit completed through [place], a video enabled telemedicine application. Due to national recommendations of social distancing due to COVID-19, a virtual visit is felt to be most appropriate for this patient at this time. Reviewed limitations, risks, security and privacy concerns of performing a virtual visit and the availability of in person appointments. I also reviewed that there may be a patient responsible charge related to this service. The patient agreed to proceed.   Patient location: home Provider location: [place] at [facility], office Persons participating in this virtual visit: patient, provider   If any vitals were documented, they were collected by patient at home unless specified below.    BP (!) 157/69   Ht _0  (1.676 m)   Wt 216 lb (98 kg)   BMI 34.86 kg/m    CC: Disability Paperwork Subjective:   HPI: Lauren HAVLICEK is a 56 y.o. female with a significant medical history including rheumatoid arthritis, chronic back pain, spondylosis of lumbar region, chronic neck pain, thoracic radiculitis, lupus, Sjogren's syndrome, lumbar postlaminectomy syndrome, failed back surgical syndrome, CVA, GAD, type 2 diabetes presenting on 11/01/2022 for Disability paperwork  She is also needing repeat A1c.    She is needing renewal of long term disability paperwork.  She remains unable to work given chronic and ongoing symptoms.  Symptoms include chronic lower back pain, bilateral hip pain, right groin, and right thigh pain. Also with chronic tingling to her bilateral feet, difficulty focusing, reduction of fine motor skills, increased tingling to fingers.   She is unable to work as she cannot carry out her normal day-to-day activities of daily living independently. She cannot sit or stand for longer than 15 min. She mostly reclines in a recliner. She has to have help with dressing,  cooking, cleaning. She can bathe herself but has noticed that this task is getting harder to do. She has a hard time focusing and reading at times. She only drives local distances.   She is pending revision of her spinal cord stimulator on 12/17/2022 per Dr. Pricilla Handler.  She has undergone multiple back surgeries, physical therapy, medications, injections.  She currently follows with rheumatology, neurosurgery, physical therapy, pain medicine, nephrology, hematology.      Relevant past medical, surgical, family and social history reviewed and updated as indicated. Interim medical history since our last visit reviewed. Allergies and medications reviewed and updated. Outpatient Medications Prior to Visit  Medication Sig Dispense Refill   amLODipine (NORVASC) 10 MG tablet TAKE ONE TAB DAILY FOR BLOOD PRESSURE. 90 tablet 1   Blood Glucose Monitoring Suppl ([place]) w/Device KIT Use as instructed to test blood sugar 3 times daily 1 kit 0   buPROPion (WELLBUTRIN XL) 150 MG 24 hr tablet TAKE 1 TABLET BY MOUTH ONCE DAILY FOR ANXIETY AND DEPRESSION. 90 tablet 3   clopidogrel (PLAVIX) 75 MG tablet TAKE 1 TABLET BY MOUTH EVERY DAY 90 tablet 1   Continuous Blood Gluc Receiver (FREESTYLE LIBRE 14 DAY READER) DEVI 1 Device by Does not apply route in the morning, at noon, and at bedtime. 1 each 0   Continuous Blood Gluc Sensor (FREESTYLE LIBRE 3 SENSOR) MISC 1 Device by Does not apply route continuous. Place 1 sensor on the skin every 14 days. Use to check glucose continuously 6 each 1   DULoxetine (CYMBALTA) 60 MG capsule Take 60 mg by mouth daily.  folic acid (FOLVITE) 1 MG tablet Take 2 tablets by mouth daily. 180 tablet 3   glucose blood test strip Use as instructed to blood sugar 3 times daily 300 each 2   hydroxychloroquine (PLAQUENIL) 200 MG tablet Take 1 tablet (200 mg total) by mouth 2 (two) times daily. 180 tablet 0   insulin degludec (TRESIBA FLEXTOUCH) 100 UNIT/ML FlexTouch Pen  Inject 45 Units into the skin daily. for diabetes. 45 mL 0   Insulin Pen Needle (PEN NEEDLES) 31G X 6 MM MISC Use with insulin as directed. 100 each 0   methotrexate (RHEUMATREX) 2.5 MG tablet TAKE 7 TABLETS (17.5 MG TOTAL) BY MOUTH ONCE A WEEK. CAUTION:CHEMOTHERAPY. PROTECT FROM LIGHT. 84 tablet 0   ondansetron (ZOFRAN) 4 MG tablet Take 1 tablet (4 mg total) by mouth every 8 (eight) hours as needed for nausea or vomiting. 20 tablet 0   rosuvastatin (CRESTOR) 20 MG tablet Take 1 tablet (20 mg total) by mouth daily. For cholesterol. 90 tablet 1   Semaglutide, 1 MG/DOSE, 4 MG/3ML SOPN Inject 1 mg as directed once a week. For diabetes. 9 mL 1   spironolactone (ALDACTONE) 25 MG tablet Take 25 mg by mouth daily.     telmisartan-hydrochlorothiazide (MICARDIS HCT) 80-25 MG tablet Take 1 tablet by mouth daily. for blood pressure. 90 tablet 1   Vitamin D, Ergocalciferol, (DRISDOL) 1.25 MG (50000 UNIT) CAPS capsule Take 1 capsule (50,000 Units total) by mouth 2 (two) times a week. 24 capsule 0   glipiZIDE (GLUCOTROL XL) 5 MG 24 hr tablet Take 1 tablet (5 mg total) by mouth daily with breakfast. for diabetes. (Patient not taking: Reported on 11/01/2022) 90 tablet 0   metoprolol succinate (TOPROL-XL) 50 MG 24 hr tablet Take 1 tablet (50 mg total) by mouth daily. For blood pressure 90 tablet 3   pregabalin (LYRICA) 25 MG capsule Take by mouth. (Patient not taking: Reported on 11/01/2022)     No facility-administered medications prior to visit.     Per HPI unless specifically indicated in ROS section below Review of Systems  Musculoskeletal:  Positive for arthralgias, back pain, myalgias and neck pain.  Neurological:  Positive for numbness.   Objective:  BP (!) 157/69   Ht _0  (1.676 m)   Wt 216 lb (98 kg)   BMI 34.86 kg/m   Wt Readings from Last 3 Encounters:  11/01/22 216 lb (98 kg)  07/29/22 206 lb (93.4 kg)  07/20/22 204 lb 3.2 oz (92.6 kg)       Physical exam: General: Alert and oriented x  3, no distress, does not appear sickly.  Lying in bed.  Pulmonary: Speaks in complete sentences without increased work of breathing, no cough during visit.  Psychiatric: Normal mood, thought content, and behavior.     Results for orders placed or performed in visit on 07/29/22  POCT glycosylated hemoglobin (Hb A1C)  Result Value Ref Range   Hemoglobin A1C 6.8 (A) 4.0 - 5.6 %   HbA1c POC (<> result, manual entry)     HbA1c, POC (prediabetic range)     HbA1c, POC (controlled diabetic range)     Assessment & Plan:   Problem List Items Addressed This Visit       Endocrine   Type 2 diabetes mellitus with hyperglycemia, with long-term current use of insulin (HCC)   Relevant Orders   Hemoglobin A1c     Nervous and Auditory   Lumbosacral spondylosis with radiculopathy    Agree to complete long-term  disability paperwork for continuation.  Follow-up with neurosurgery as scheduled in December.        Musculoskeletal and Integument   Rheumatoid arthritis involving multiple sites with positive rheumatoid factor (HCC) (Chronic)    Following with rheumatology. Continue methotrexate 17.5 mg weekly, hydroxychloroquine 200 mg twice daily, Cymbalta 60 mg daily.         Other   Fibromyalgia (Chronic)    Following with rheumatology.  Continue Cymbalta 60 mg daily.      Lumbar post-laminectomy syndrome (L3-4, L4-5) - Primary (Chronic)    Following with neurosurgery and is pending revision of spinal cord stimulator for December 2023.  Agreed to continue long-term disability paperwork given her chronic symptoms and inability to function in the workplace setting.        No orders of the defined types were placed in this encounter.  Orders Placed This Encounter  Procedures   Hemoglobin A1c    Standing Status:   Future    Standing Expiration Date:   11/02/2023    I discussed the assessment and treatment plan with the patient. The patient was provided an opportunity to ask  questions and all were answered. The patient agreed with the plan and demonstrated an understanding of the instructions. The patient was advised to call back or seek an in-person evaluation if the symptoms worsen or if the condition fails to improve as anticipated.  Follow up plan:  I will complete your long-term disability paperwork.  Pleas Koch, NP

## 2022-11-01 NOTE — Assessment & Plan Note (Signed)
Following with rheumatology. Continue methotrexate 17.5 mg weekly, hydroxychloroquine 200 mg twice daily, Cymbalta 60 mg daily.

## 2022-11-01 NOTE — Assessment & Plan Note (Signed)
Following with neurosurgery and is pending revision of spinal cord stimulator for December 2023.  Agreed to continue long-term disability paperwork given her chronic symptoms and inability to function in the workplace setting.

## 2022-11-01 NOTE — Assessment & Plan Note (Signed)
Following with rheumatology.  Continue Cymbalta 60 mg daily.

## 2022-11-01 NOTE — Assessment & Plan Note (Signed)
Agree to complete long-term disability paperwork for continuation.  Follow-up with neurosurgery as scheduled in December.

## 2022-11-02 ENCOUNTER — Telehealth: Payer: Self-pay

## 2022-11-02 ENCOUNTER — Ambulatory Visit: Payer: Medicare Other | Attending: Physician Assistant | Admitting: Physician Assistant

## 2022-11-02 ENCOUNTER — Encounter: Payer: Self-pay | Admitting: Physician Assistant

## 2022-11-02 VITALS — BP 132/84 | HR 94 | Resp 17 | Ht 66.0 in | Wt 212.4 lb

## 2022-11-02 DIAGNOSIS — I7300 Raynaud's syndrome without gangrene: Secondary | ICD-10-CM | POA: Diagnosis not present

## 2022-11-02 DIAGNOSIS — M7062 Trochanteric bursitis, left hip: Secondary | ICD-10-CM

## 2022-11-02 DIAGNOSIS — Z9889 Other specified postprocedural states: Secondary | ICD-10-CM

## 2022-11-02 DIAGNOSIS — M3219 Other organ or system involvement in systemic lupus erythematosus: Secondary | ICD-10-CM | POA: Diagnosis not present

## 2022-11-02 DIAGNOSIS — E119 Type 2 diabetes mellitus without complications: Secondary | ICD-10-CM

## 2022-11-02 DIAGNOSIS — Z79899 Other long term (current) drug therapy: Secondary | ICD-10-CM | POA: Diagnosis not present

## 2022-11-02 DIAGNOSIS — I340 Nonrheumatic mitral (valve) insufficiency: Secondary | ICD-10-CM

## 2022-11-02 DIAGNOSIS — M19041 Primary osteoarthritis, right hand: Secondary | ICD-10-CM | POA: Diagnosis not present

## 2022-11-02 DIAGNOSIS — Z8673 Personal history of transient ischemic attack (TIA), and cerebral infarction without residual deficits: Secondary | ICD-10-CM

## 2022-11-02 DIAGNOSIS — M797 Fibromyalgia: Secondary | ICD-10-CM

## 2022-11-02 DIAGNOSIS — M7061 Trochanteric bursitis, right hip: Secondary | ICD-10-CM

## 2022-11-02 DIAGNOSIS — I10 Essential (primary) hypertension: Secondary | ICD-10-CM

## 2022-11-02 DIAGNOSIS — E559 Vitamin D deficiency, unspecified: Secondary | ICD-10-CM | POA: Diagnosis not present

## 2022-11-02 DIAGNOSIS — M170 Bilateral primary osteoarthritis of knee: Secondary | ICD-10-CM | POA: Diagnosis not present

## 2022-11-02 DIAGNOSIS — R110 Nausea: Secondary | ICD-10-CM

## 2022-11-02 DIAGNOSIS — Z87891 Personal history of nicotine dependence: Secondary | ICD-10-CM

## 2022-11-02 DIAGNOSIS — Z13820 Encounter for screening for osteoporosis: Secondary | ICD-10-CM

## 2022-11-02 DIAGNOSIS — M19042 Primary osteoarthritis, left hand: Secondary | ICD-10-CM

## 2022-11-02 MED ORDER — HYDROXYCHLOROQUINE SULFATE 200 MG PO TABS: 200.0000 mg | ORAL_TABLET | Freq: Two times a day (BID) | ORAL | 0 refills | Status: DC

## 2022-11-02 NOTE — Telephone Encounter (Signed)
We received a disability form for patient from [employer].  Completed form has been faxed to fax# [phone].  Received fax confirmation.  Copies have been made for patient, scanning, and myself.  Sent msg to patient via mychart to see if she wants copy mailed or if she will pick it up.

## 2022-11-03 LAB — COMPLETE METABOLIC PANEL WITH GFR
AG Ratio: 1.2 (calc) (ref 1.0–2.5)
ALT: 8 U/L (ref 6–29)
AST: 17 U/L (ref 10–35)
Albumin: 4.5 g/dL (ref 3.6–5.1)
Alkaline phosphatase (APISO): 101 U/L (ref 37–153)
BUN: 18 mg/dL (ref 7–25)
CO2: 29 mmol/L (ref 20–32)
Calcium: 9.6 mg/dL (ref 8.6–10.4)
Chloride: 97 mmol/L — ABNORMAL LOW (ref 98–110)
Creat: 0.92 mg/dL (ref 0.50–1.03)
Globulin: 3.9 g/dL (calc) — ABNORMAL HIGH (ref 1.9–3.7)
Glucose, Bld: 93 mg/dL (ref 65–99)
Potassium: 3.9 mmol/L (ref 3.5–5.3)
Sodium: 137 mmol/L (ref 135–146)
Total Bilirubin: 0.7 mg/dL (ref 0.2–1.2)
Total Protein: 8.4 g/dL — ABNORMAL HIGH (ref 6.1–8.1)
eGFR: 72 mL/min/{1.73_m2} (ref >=60)

## 2022-11-03 LAB — SEDIMENTATION RATE: Sed Rate: 55 mm/h — ABNORMAL HIGH (ref 0–30)

## 2022-11-03 LAB — CBC WITH DIFFERENTIAL/PLATELET
Absolute Monocytes: 605 cells/uL (ref 200–950)
Basophils Absolute: 84 cells/uL (ref 0–200)
Basophils Relative: 0.9 %
Eosinophils Absolute: 205 cells/uL (ref 15–500)
Eosinophils Relative: 2.2 %
HCT: 42.9 % (ref 35.0–45.0)
Hemoglobin: 13.8 g/dL (ref 11.7–15.5)
Lymphs Abs: 2837 cells/uL (ref 850–3900)
MCH: 26.7 pg — ABNORMAL LOW (ref 27.0–33.0)
MCHC: 32.2 g/dL (ref 32.0–36.0)
MCV: 83 fL (ref 80.0–100.0)
MPV: 9 fL (ref 7.5–12.5)
Monocytes Relative: 6.5 %
Neutro Abs: 5571 cells/uL (ref 1500–7800)
Neutrophils Relative %: 59.9 %
Platelets: 522 10*3/uL — ABNORMAL HIGH (ref 140–400)
RBC: 5.17 10*6/uL — ABNORMAL HIGH (ref 3.80–5.10)
RDW: 13.8 % (ref 11.0–15.0)
Total Lymphocyte: 30.5 %
WBC: 9.3 10*3/uL (ref 3.8–10.8)

## 2022-11-03 LAB — PROTEIN / CREATININE RATIO, URINE
Creatinine, Urine: 425 mg/dL — ABNORMAL HIGH (ref 20–275)
Protein/Creat Ratio: 87 mg/g creat (ref 24–184)
Protein/Creatinine Ratio: 0.087 mg/mg creat (ref 0.024–0.184)
Total Protein, Urine: 37 mg/dL — ABNORMAL HIGH (ref 5–24)

## 2022-11-03 LAB — C3 AND C4
C3 Complement: 207 mg/dL — ABNORMAL HIGH (ref 83–193)
C4 Complement: 33 mg/dL (ref 15–57)

## 2022-11-03 LAB — ANA: Anti Nuclear Antibody (ANA): NEGATIVE

## 2022-11-03 LAB — HEMOGLOBIN A1C
Hgb A1c MFr Bld: 8.3 % of total Hgb — ABNORMAL HIGH (ref <5.7)
Mean Plasma Glucose: 192 mg/dL
eAG (mmol/L): 10.6 mmol/L

## 2022-11-03 LAB — VITAMIN D 25 HYDROXY (VIT D DEFICIENCY, FRACTURES): Vit D, 25-Hydroxy: 28 ng/mL — ABNORMAL LOW (ref 30–100)

## 2022-11-03 LAB — ANTI-DNA ANTIBODY, DOUBLE-STRANDED: ds DNA Ab: <1 IU/mL

## 2022-11-03 NOTE — Telephone Encounter (Signed)
Patient's copy mailed to address on system.

## 2022-11-04 ENCOUNTER — Other Ambulatory Visit: Payer: Self-pay | Admitting: *Deleted

## 2022-11-04 DIAGNOSIS — I10 Essential (primary) hypertension: Secondary | ICD-10-CM | POA: Diagnosis not present

## 2022-11-04 DIAGNOSIS — F32A Depression, unspecified: Secondary | ICD-10-CM | POA: Diagnosis not present

## 2022-11-04 DIAGNOSIS — E782 Mixed hyperlipidemia: Secondary | ICD-10-CM | POA: Diagnosis not present

## 2022-11-04 DIAGNOSIS — Z01818 Encounter for other preprocedural examination: Secondary | ICD-10-CM | POA: Diagnosis not present

## 2022-11-04 DIAGNOSIS — M329 Systemic lupus erythematosus, unspecified: Secondary | ICD-10-CM | POA: Diagnosis not present

## 2022-11-04 DIAGNOSIS — E559 Vitamin D deficiency, unspecified: Secondary | ICD-10-CM

## 2022-11-04 DIAGNOSIS — Z8673 Personal history of transient ischemic attack (TIA), and cerebral infarction without residual deficits: Secondary | ICD-10-CM | POA: Diagnosis not present

## 2022-11-04 DIAGNOSIS — G894 Chronic pain syndrome: Secondary | ICD-10-CM | POA: Diagnosis not present

## 2022-11-04 MED ORDER — VITAMIN D (ERGOCALCIFEROL) 1.25 MG (50000 UNIT) PO CAPS: 50000.0000 [IU] | ORAL_CAPSULE | ORAL | 0 refills | Status: DC

## 2022-11-04 NOTE — Progress Notes (Signed)
ANA negative. dsDNA is negative.   C3 is slightly elevated and C4 is WNL.  Labs are not consistent with a flare.    ESR is elevated-55.  If she continues to have chronic pain in both hands we can schedule an ultrasound of her hands.   CBC and CMP stable.  Vitamin D is borderline low but has improved.  Ok to send in vitamin D 50,000 units once weekly x3 months.  Recheck in 3 months.   Total urine protein is borderline elevated.  Please clarify if she has seen a nephrologist in the past?   HgbA1c is elevated-8.3%.  please notify the patient and forward results to PCP as requested.

## 2022-11-04 NOTE — Telephone Encounter (Signed)
-----  Message from Ofilia Neas, PA-C sent at 11/04/2022 12:10 PM EST ----- ANA negative. dsDNA is negative.   C3 is slightly elevated and C4 is WNL.  Labs are not consistent with a flare.    ESR is elevated-55.  If she continues to have chronic pain in both hands we can schedule an ultrasound of her hands.   CBC and CMP stable.  Vitamin D is borderline low but has improved.  Ok to send in vitamin D 50,000 units once weekly x3 months.  Recheck in 3 months.   Total urine protein is borderline elevated.  Please clarify if she has seen a nephrologist in the past?   HgbA1c is elevated-8.3%.  please notify the patient and forward results to PCP as requested.

## 2022-11-08 DIAGNOSIS — D472 Monoclonal gammopathy: Secondary | ICD-10-CM | POA: Diagnosis not present

## 2022-11-08 DIAGNOSIS — R0600 Dyspnea, unspecified: Secondary | ICD-10-CM | POA: Diagnosis not present

## 2022-11-08 DIAGNOSIS — E1122 Type 2 diabetes mellitus with diabetic chronic kidney disease: Secondary | ICD-10-CM | POA: Diagnosis not present

## 2022-11-08 DIAGNOSIS — E1142 Type 2 diabetes mellitus with diabetic polyneuropathy: Secondary | ICD-10-CM | POA: Diagnosis not present

## 2022-11-08 DIAGNOSIS — Z23 Encounter for immunization: Secondary | ICD-10-CM | POA: Diagnosis not present

## 2022-11-08 DIAGNOSIS — Z862 Personal history of diseases of the blood and blood-forming organs and certain disorders involving the immune mechanism: Secondary | ICD-10-CM | POA: Diagnosis not present

## 2022-11-08 DIAGNOSIS — N189 Chronic kidney disease, unspecified: Secondary | ICD-10-CM | POA: Diagnosis not present

## 2022-11-17 DIAGNOSIS — Z79899 Other long term (current) drug therapy: Secondary | ICD-10-CM | POA: Diagnosis not present

## 2022-11-17 LAB — HM DIABETES EYE EXAM

## 2022-11-22 DIAGNOSIS — E1165 Type 2 diabetes mellitus with hyperglycemia: Secondary | ICD-10-CM | POA: Diagnosis not present

## 2022-11-26 ENCOUNTER — Encounter: Payer: Self-pay | Admitting: Primary Care

## 2022-11-29 DIAGNOSIS — M5440 Lumbago with sciatica, unspecified side: Secondary | ICD-10-CM | POA: Diagnosis not present

## 2022-11-30 DIAGNOSIS — M810 Age-related osteoporosis without current pathological fracture: Secondary | ICD-10-CM | POA: Diagnosis not present

## 2022-11-30 DIAGNOSIS — G8929 Other chronic pain: Secondary | ICD-10-CM | POA: Diagnosis not present

## 2022-11-30 DIAGNOSIS — Z823 Family history of stroke: Secondary | ICD-10-CM | POA: Diagnosis not present

## 2022-11-30 DIAGNOSIS — I10 Essential (primary) hypertension: Secondary | ICD-10-CM | POA: Diagnosis not present

## 2022-11-30 DIAGNOSIS — Z9889 Other specified postprocedural states: Secondary | ICD-10-CM | POA: Diagnosis not present

## 2022-11-30 DIAGNOSIS — M3500 Sicca syndrome, unspecified: Secondary | ICD-10-CM | POA: Diagnosis not present

## 2022-11-30 DIAGNOSIS — T85192A Other mechanical complication of implanted electronic neurostimulator (electrode) of spinal cord, initial encounter: Secondary | ICD-10-CM | POA: Diagnosis not present

## 2022-11-30 DIAGNOSIS — Z8673 Personal history of transient ischemic attack (TIA), and cerebral infarction without residual deficits: Secondary | ICD-10-CM | POA: Diagnosis not present

## 2022-11-30 DIAGNOSIS — Z981 Arthrodesis status: Secondary | ICD-10-CM | POA: Diagnosis not present

## 2022-11-30 DIAGNOSIS — Z794 Long term (current) use of insulin: Secondary | ICD-10-CM | POA: Diagnosis not present

## 2022-11-30 DIAGNOSIS — Z7901 Long term (current) use of anticoagulants: Secondary | ICD-10-CM | POA: Diagnosis not present

## 2022-11-30 DIAGNOSIS — M069 Rheumatoid arthritis, unspecified: Secondary | ICD-10-CM | POA: Diagnosis not present

## 2022-11-30 DIAGNOSIS — M5440 Lumbago with sciatica, unspecified side: Secondary | ICD-10-CM | POA: Diagnosis not present

## 2022-11-30 DIAGNOSIS — Z87891 Personal history of nicotine dependence: Secondary | ICD-10-CM | POA: Diagnosis not present

## 2022-11-30 DIAGNOSIS — Z8249 Family history of ischemic heart disease and other diseases of the circulatory system: Secondary | ICD-10-CM | POA: Diagnosis not present

## 2022-11-30 DIAGNOSIS — M329 Systemic lupus erythematosus, unspecified: Secondary | ICD-10-CM | POA: Diagnosis not present

## 2022-11-30 DIAGNOSIS — E119 Type 2 diabetes mellitus without complications: Secondary | ICD-10-CM | POA: Diagnosis not present

## 2022-11-30 DIAGNOSIS — M419 Scoliosis, unspecified: Secondary | ICD-10-CM | POA: Diagnosis not present

## 2022-11-30 DIAGNOSIS — Z79899 Other long term (current) drug therapy: Secondary | ICD-10-CM | POA: Diagnosis not present

## 2022-11-30 HISTORY — PX: SPINAL CORD STIMULATOR IMPLANT: SHX2422

## 2022-12-07 NOTE — Progress Notes (Signed)
[facility] Quality Team Note  Name: Lauren Winters Date of Birth: 14-Jul-1966 MRN: [id number] Date: 12/07/2022  [facility] Quality Team has reviewed this patient's chart, please see recommendations below:  [facility] Quality Other; Pt has open quality gap for KED, needs Micro/Creat Urine test to close this. Please address at next visit.

## 2022-12-13 DIAGNOSIS — Z48811 Encounter for surgical aftercare following surgery on the nervous system: Secondary | ICD-10-CM | POA: Diagnosis not present

## 2022-12-15 ENCOUNTER — Other Ambulatory Visit: Payer: Self-pay | Admitting: Primary Care

## 2022-12-15 DIAGNOSIS — E119 Type 2 diabetes mellitus without complications: Secondary | ICD-10-CM

## 2023-01-02 ENCOUNTER — Other Ambulatory Visit: Payer: Self-pay | Admitting: Rheumatology

## 2023-01-03 NOTE — Telephone Encounter (Signed)
Next Visit: 02/10/2023  Last Visit: 11/02/2022  Last Fill: 07/20/2022  Dx: Other organ or system involvement in systemic lupus erythematosus   Current Dose per office note on 11/02/2022: not discussed  Okay to refill Zofran?

## 2023-01-04 DIAGNOSIS — M47814 Spondylosis without myelopathy or radiculopathy, thoracic region: Secondary | ICD-10-CM | POA: Diagnosis not present

## 2023-01-04 DIAGNOSIS — M47812 Spondylosis without myelopathy or radiculopathy, cervical region: Secondary | ICD-10-CM | POA: Diagnosis not present

## 2023-01-04 DIAGNOSIS — M40204 Unspecified kyphosis, thoracic region: Secondary | ICD-10-CM | POA: Diagnosis not present

## 2023-01-04 DIAGNOSIS — M47816 Spondylosis without myelopathy or radiculopathy, lumbar region: Secondary | ICD-10-CM | POA: Diagnosis not present

## 2023-01-04 DIAGNOSIS — Z9689 Presence of other specified functional implants: Secondary | ICD-10-CM | POA: Diagnosis not present

## 2023-01-11 DIAGNOSIS — G894 Chronic pain syndrome: Secondary | ICD-10-CM | POA: Diagnosis not present

## 2023-01-11 DIAGNOSIS — M47818 Spondylosis without myelopathy or radiculopathy, sacral and sacrococcygeal region: Secondary | ICD-10-CM | POA: Diagnosis not present

## 2023-01-11 DIAGNOSIS — M4727 Other spondylosis with radiculopathy, lumbosacral region: Secondary | ICD-10-CM | POA: Diagnosis not present

## 2023-01-27 NOTE — Progress Notes (Signed)
Office Visit Note  Patient: Lauren Winters             Date of Birth: 1966/08/29           MRN: [id number]             PCP: Pleas Koch, NP Referring: Pleas Koch, NP Visit Date: 02/10/2023 Occupation: @GUAROCC$ @  Subjective:  Pain in joints  History of Present Illness: Lauren Winters is a 57 y.o. female history of systemic lupus, osteoarthritis and fibromyalgia syndrome.  She states she had replacement of spinal stimulator leads in December and had to come off methotrexate for a week.  She otherwise has been taking methotrexate and Plaquenil on a regular basis.  She continues to have pain and stiffness in her bilateral hands and her bilateral knee joints.  She continues to have generalized pain and stiffness from fibromyalgia.  She also complains of fatigue, dry mouth, dry eyes, joint stiffness and muscle pain.  She states her fibromyalgia syndrome has been flaring.  There is no history of oral ulcers, nasal ulcers, lymphadenopathy, photosensitivity or Raynaud's.  She continues to have lower back pain. It is only 30% relief in pain from the spinal cord stimulator.  She has an appointment coming up with pain management in 2 weeks.    Activities of Daily Living:  Patient reports morning stiffness for 5  hours.   Patient Reports nocturnal pain.  Difficulty dressing/grooming: Reports Difficulty climbing stairs: Reports Difficulty getting out of chair: Reports Difficulty using hands for taps, buttons, cutlery, and/or writing: Reports  Review of Systems  Constitutional:  Positive for fatigue.  HENT:  Positive for mouth dryness. Negative for mouth sores.   Eyes:  Positive for dryness.  Respiratory:  Negative for difficulty breathing.   Cardiovascular: Negative.  Negative for chest pain and palpitations.  Gastrointestinal: Negative.  Negative for blood in stool, constipation and diarrhea.  Endocrine: Positive for increased urination.  Genitourinary: Negative.  Negative  for involuntary urination.  Musculoskeletal:  Positive for joint pain, gait problem, joint pain, muscle weakness, morning stiffness and muscle tenderness. Negative for joint swelling, myalgias and myalgias.  Skin:  Positive for hair loss. Negative for color change, rash and sensitivity to sunlight.  Allergic/Immunologic: Negative.  Negative for susceptible to infections.  Neurological:  Positive for headaches. Negative for dizziness.  Hematological:  Negative for swollen glands.  Psychiatric/Behavioral:  Positive for depressed mood and sleep disturbance. The patient is nervous/anxious.     PMFS History:  Patient Active Problem List   Diagnosis Date Noted   Post laminectomy syndrome 05/19/2022   Type 2 diabetes mellitus with hyperglycemia, with long-term current use of insulin ([place]) 12/08/2021   Persistent cough for 3 weeks or longer 12/08/2021   Encounter for pain management counseling 07/25/2021   History of infection of neurostimulator battery w/ removal of device 06/12/2021    Class: History of   Elevated C-reactive protein (CRP) 06/12/2021   Elevated sed rate 06/12/2021   DDD (degenerative disc disease), thoracic 06/12/2021   DDD (degenerative disc disease), cervical 06/12/2021   Osteoarthritis of hip (Right) 06/12/2021   Other idiopathic scoliosis, thoracolumbar region 06/12/2021   Fixation hardware in spine (L3-4 and L4-5) 06/12/2021   Chronic pain syndrome 06/10/2021   Pharmacologic therapy 06/10/2021   Disorder of skeletal system 06/10/2021   Problems influencing health status 06/10/2021   Vitamin D deficiency 06/10/2021   Positive ANA (antinuclear antibody) 06/10/2021   Elevated rheumatoid factor 06/10/2021   Elevated d-dimer  06/10/2021   Chronic anticoagulation (Plavix) 06/10/2021   Chronic lower extremity pain (3ry area of Pain) (Right) 06/10/2021   Diabetic peripheral neuropathy (4th area of Pain) ([place]) 06/10/2021   Chronic, intermittent, rib pain (8th area of Pain)  (Bilateral) 06/10/2021   Thoracic radiculitis (T6) (intermittent) (Bilateral) 06/10/2021   Chronic neck pain (6th area of Pain) (posterior) (Midline) (R>L) 06/10/2021   Cervicalgia 06/10/2021   Chronic upper extremity pain (7th area of Pain) (Right) 06/10/2021   Cough 01/14/2021   Adenopathy, cervical 08/18/2020   Monoclonal gammopathy 05/14/2020   Bilateral primary osteoarthritis of hip 10/19/2019   Paresthesias 03/02/2019   Chronic hand pain (Bilateral) 12/06/2018   Headache disorder 12/06/2018   Left arm weakness 10/11/2018   Numbness and tingling in left arm 10/11/2018   Essential hypertension 10/06/2018   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[place]) 10/06/2018   Stage 3 chronic kidney disease ([place]) 10/06/2018   History of stroke 08/19/2018   History of lumbar spinal fusion (L3-4, L4-5) 05/26/2018   Local infection of wound 02/07/2018   High risk medication use 02/03/2018   Trochanteric bursitis 06/09/2017   Lumbar post-laminectomy syndrome (L3-4, L4-5) 06/09/2017   Rheumatoid arthritis involving multiple sites with positive rheumatoid factor ([place]) 06/09/2017   HNP (herniated nucleus pulposus), lumbar 03/29/2017   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   Chronic shoulder pain (Right) 02/18/2017   Herniation of nucleus pulposus of cervical intervertebral disc without myelopathy 02/09/2017   Lumbosacral spondylosis with radiculopathy 01/21/2017   Primary osteoarthritis of both hands 12/22/2016   Primary osteoarthritis of both knees 12/22/2016   Spondylosis of lumbar region without myelopathy or radiculopathy 12/22/2016   Fibromyalgia 12/22/2016   Preventative health care 04/19/2016   Chronic hip pain (2ry area of Pain) (Bilateral) 03/09/2016   Disorder of sacroiliac joint 02/23/2016   Chronic low back pain (1ry area of Pain) (Bilateral) (R>L) w/o sciatica 02/04/2016   Chronic mid back pain (5th area of Pain) (Bilateral) 12/17/2015   Displacement of lumbar  intervertebral disc without myelopathy 10/29/2015   Hyperlipidemia 08/29/2015   Lupus ([place]) 08/04/2015   Rheumatoid factor positive 08/04/2015   Sjogren's syndrome ([place]) 08/04/2015   Mitral regurgitation    Atypical chest pain    Obesity (BMI 30-39.9)    GAD (generalized anxiety disorder)    Depression 05/23/2014   Chest pain 05/23/2014    Past Medical History:  Diagnosis Date   Anxiety    Atypical chest pain    a. ETT 05/2014: normal stress test without evidence of myocardial ischemia or chest pain at peak stress; b. 02/2021 Cor CTA: Ca2+ score = 0. Nl cors.   Complication of anesthesia    migraine headache   Depression    Diabetes mellitus without complication ([place]) metformin   dx 2015   Dyspnea    on exertion   Fibromyalgia    Headache    Heart murmur    a. told back in 2007, but no mention with 2016 cardiac w/u; b. 02/2019 Echo: EF 60-65%, conc LVH. Nl RV fxn. Mild Ca2+ of MV. Triv AI.   Hyperlipidemia    Hypertension    Lumbar radiculopathy    Lupus (systemic lupus erythematosus) (HCC)    Mitral regurgitation    a. 05/2014 Echo: Mild MR/AI; b. 05/2019 Echo: Mild Ca2+ of MV.   Obesity (BMI 30-39.9)    Palpitations    a. 02/2021 Zio: Predominantly SR @ 75 (44-167). 8 SVT runs (max 167 bpm, longest 10 beats). ? Atrial tach. Triggered  events assoc w/ SVT. Otw rare isolated PACs/PVCs. No afib/flutter.   Raynaud's phenomenon    Rheumatoid arthritis ([place])    S/P lumbar laminectomy 12/09/2017   S/P lumbar spinal fusion 05/26/2018   Sjogren's disease ([place])    Spinal stenosis    Spondylolisthesis of lumbar region    Stroke [facility])    Tobacco abuse    a. ongoing; b. ongoing SOB   Wound infection after surgery 01/11/2018    Family History  Problem Relation Age of Onset   CAD Father 77       CABG x 4   Liver cancer Father 62       passed   Valvular heart disease Father    Hypertension Father    CAD Paternal 77        CABG   Breast cancer Paternal Aunt    CAD Paternal  Aunt        CABG   Breast cancer Paternal Aunt    CAD Paternal 110        CABG   Breast cancer Paternal Aunt    CAD Paternal 7        CABG   Breast cancer Paternal Aunt    CAD Paternal Aunt        CABG   Breast cancer Paternal Aunt    CAD Paternal Uncle        MI s/p CABG   Stroke Maternal Grandmother    Stroke Maternal Grandfather    CAD Maternal Grandfather    Heart Problems Brother    Past Surgical History:  Procedure Laterality Date   ABDOMINAL HYSTERECTOMY     BACK SURGERY     BONE MARROW BIOPSY  10/2019   CESAREAN SECTION     x3   LUMBAR LAMINECTOMY     LUMBAR LAMINECTOMY/DECOMPRESSION MICRODISCECTOMY N/A 03/29/2017   Procedure: Re operative Right Lumbar Three-Four Laminectomy and discectomy with Left Lumbar Three-Four, Bilateral Lumbar Four-Five Laminectomy for decompression;  Surgeon: Kevan Ny Ditty, MD;  Location: [facility];  Service: Neurosurgery;  Laterality: N/A;   SPINAL CORD STIMULATOR IMPLANT  11/30/2022   SPINAL CORD STIMULATOR INSERTION  12/09/2017   Procedure: LUMBAR SPINAL CORD STIMULATOR IMPLANT;  Surgeon: Eustace Moore, MD;  Location: [facility];  Service: Neurosurgery;;   SPINAL CORD STIMULATOR INSERTION     06/18/2022 leads placed, 06/25/2022 battery placed   SPINAL CORD STIMULATOR INSERTION  06/25/2022   SPINAL CORD STIMULATOR REMOVAL N/A 01/11/2018   Procedure: LUMBAR SPINAL CORD STIMULATOR REMOVAL;  Surgeon: Eustace Moore, MD;  Location: [facility];  Service: Neurosurgery;  Laterality: N/A;   TUBAL LIGATION     Social History   Social History Narrative   Married.   3 children.   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    Immunization History  Administered Date(s) Administered   Influenza,inj,Quad PF,6+ Mos 10/19/2016, 10/21/2017, 01/31/2019, 10/19/2019, 12/08/2021   PFIZER(Purple Top)SARS-COV-2 Vaccination 04/17/2020, 05/08/2020   Pneumococcal Polysaccharide-23 10/27/2014, 06/25/2019   Td 04/19/2016   Zoster  Recombinat (Shingrix) 03/26/2022   Zoster, Live 10/27/2014     Objective: Vital Signs: BP (!) 149/89 (BP Location: Left Arm, Patient Position: Sitting, Cuff Size: Large)   Pulse 89   Resp 16   Ht 5' 6"$  (1.676 m)   Wt 211 lb (95.7 kg)   BMI 34.06 kg/m    Physical Exam Vitals and nursing note reviewed.  Constitutional:      Appearance: She is well-developed.  HENT:  Head: Normocephalic and atraumatic.  Eyes:     Conjunctiva/sclera: Conjunctivae normal.  Cardiovascular:     Rate and Rhythm: Normal rate and regular rhythm.     Heart sounds: Normal heart sounds.  Pulmonary:     Effort: Pulmonary effort is normal.     Breath sounds: Normal breath sounds.  Abdominal:     General: Bowel sounds are normal.     Palpations: Abdomen is soft.  Musculoskeletal:     Cervical back: Normal range of motion.  Lymphadenopathy:     Cervical: No cervical adenopathy.  Skin:    General: Skin is warm and dry.     Capillary Refill: Capillary refill takes less than 2 seconds.  Neurological:     Mental Status: She is alert and oriented to person, place, and time.  Psychiatric:        Behavior: Behavior normal.      Musculoskeletal Exam: Limited lateral rotation of the cervical spine.  She had limited painful range of motion of lumbar spine.  Shoulder joints, elbow joints, wrist joints, MCPs, PIPs and DIPs were in good range of motion with no synovitis.  She had limited range of motion of her bilateral hip joints due to lower back pain.  Knee joints were in good range of motion.  There was no tenderness over ankles or MTPs.  CDAI Exam: CDAI Score: -- Patient Global: --; Provider Global: -- Swollen: --; Tender: -- Joint Exam 02/10/2023   No joint exam has been documented for this visit   There is currently no information documented on the homunculus. Go to the Rheumatology activity and complete the homunculus joint exam.  Investigation: No additional findings.  Imaging: No results  found.  Recent Labs: Lab Results  Component Value Date   WBC 9.3 11/02/2022   HGB 13.8 11/02/2022   PLT 522 (H) 11/02/2022   NA 137 11/02/2022   K 3.9 11/02/2022   CL 97 (L) 11/02/2022   CO2 29 11/02/2022   GLUCOSE 93 11/02/2022   BUN 18 11/02/2022   CREATININE 0.92 11/02/2022   BILITOT 0.7 11/02/2022   ALKPHOS 102 06/10/2021   AST 17 11/02/2022   ALT 8 11/02/2022   PROT 8.4 (H) 11/02/2022   ALBUMIN 4.4 06/10/2021   CALCIUM 9.6 11/02/2022   GFRAA 103 10/20/2020    Speciality Comments: PLQ Eye Exam: 11/17/2022 @ [place] Follow up in 1 year  Procedures:  No procedures performed Allergies: Pseudoephedrine hcl, Ancef [cefazolin], Clindamycin/lincomycin, Prednisone, Triprolidine hcl, Adhesive [tape], Other, and Sudafed [pseudoephedrine hcl]   Assessment / Plan:     Visit Diagnoses: Other organ or system involvement in systemic lupus erythematosus ([place]) - +SSB, +RF,oral ulcers, malar rash, fatigue, photosensitivity, Raynaud's, Subcutaneous lupus-bx+, hair loss, sicca symptoms: -Patient continues to have fatigue dry mouth, dry eyes joint pain and hair loss.  She denies any history of oral ulcers, nasal ulcers, malar rash, Raynaud's, photosensitivity or lymphadenopathy.  Patient had no synovitis on my examination.  I will obtain following labs today.  Labs obtained on November 02, 2022 ANA was negative, complements were normal and double-stranded DNA was negative.  Sed rate was elevated.  Plan: Protein / creatinine ratio, urine, Anti-DNA antibody, double-stranded, C3 and C4, Sedimentation rate, Sjogrens syndrome-A extractable nuclear antibody, Sjogrens syndrome-B extractable nuclear antibody  High risk medication use - Plaquenil 200 mg 1 tablet by mouth twice daily, methotrexate 7 tablets by mouth once weekly, folic acid 68m po qd.PLQ Eye Exam: 11/17/2022 -CBC and CMP were stable.  She continues to have thrombocytosis.  Will continue to monitor.  Plan: CBC with  Differential/Platelet, COMPLETE METABOLIC PANEL WITH GFR.  Information on immunization was placed in the AVS.  She was advised to hold methotrexate if she develops an infection resume after the infection resolves.  Raynaud's syndrome without gangrene-she is currently not having any symptoms as the weather warmed up.  Keeping core temperature warm and warm clothing was advised.  Primary osteoarthritis of both hands-she had bilateral PIP and DIP thickening.  Joint protection muscle strengthening was discussed.  Primary osteoarthritis of both knees-she continues to have discomfort in her knee joints.  Weight loss diet and exercise was emphasized.  She is very limited with exercise due to chronic lower back pain.  Trochanteric bursitis of both hips-she continues to have some tenderness over bilateral trochanteric bursa.  IT band stretches were advised which are also difficult for her to do.  S/P lumbar laminectomy-she continues to have severe lower back pain.  She will be going to pain management.  The spinal stimulator unit has not been helpful to relieve her pain.  Fibromyalgia-she continues to generalized pain and discomfort from fibromyalgia.  She has several positive tender points.  Osteoporosis screening - DEXA scan on 05/04/17 was normal done by her PCP. DEXA updated on 8/23.23: the BMD measured at Femur Neck Left is 0.962 g/cm2 with a T-score of -0.5.  Vitamin D deficiency -her vitamin D was low at 28 on November 02, 2022.  She is currently on vitamin D 50,000 units once a week.  Will check vitamin D levels today.  If her vitamin D is within normal limits we will keep her on vitamin D 50,000 units twice a month or 5000 units daily.  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  Diabetes mellitus without complication ([place])  Mitral valve insufficiency,  unspecified etiology  Essential hypertension-blood pressure was elevated today.  Repeat blood pressure was better.  She was advised to monitor blood pressure closely and follow-up with her PCP.  Former smoker  Orders: Orders Placed This Encounter  Procedures   Protein / creatinine ratio, urine   CBC with Differential/Platelet   COMPLETE METABOLIC PANEL WITH GFR   Anti-DNA antibody, double-stranded   C3 and C4   Sedimentation rate   VITAMIN D 25 Hydroxy (Vit-D Deficiency, Fractures)   Sjogrens syndrome-A extractable nuclear antibody   Sjogrens syndrome-B extractable nuclear antibody   No orders of the defined types were placed in this encounter.    Follow-Up Instructions: Return in about 5 months (around 07/11/2023) for Systemic lupus, Osteoarthritis.   Bo Merino, MD  Note - This record has been created using Editor, commissioning.  Chart creation errors have been sought, but may not always  have been located. Such creation errors do not reflect on  the standard of medical care.

## 2023-01-28 ENCOUNTER — Other Ambulatory Visit: Payer: Self-pay | Admitting: Primary Care

## 2023-01-28 DIAGNOSIS — E785 Hyperlipidemia, unspecified: Secondary | ICD-10-CM

## 2023-01-29 ENCOUNTER — Other Ambulatory Visit: Payer: Self-pay | Admitting: Physician Assistant

## 2023-01-31 NOTE — Telephone Encounter (Signed)
Next Visit: 02/10/2023  Last Visit: 11/02/2022  Labs: 11/08/2022 CBC: MPV 6.4, platelet 496 CMP: ALT 8  Eye exam: 11/17/2022   Current Dose per office note on 11/02/2022: Plaquenil 200 mg 1 tablet by mouth twice daily   KO:[id number] organ or system involvement in systemic lupus erythematosus   Last Fill: 11/02/2022  Okay to refill Plaquenil?

## 2023-02-01 ENCOUNTER — Ambulatory Visit: Payer: Medicare Other | Admitting: Primary Care

## 2023-02-09 DIAGNOSIS — E1165 Type 2 diabetes mellitus with hyperglycemia: Secondary | ICD-10-CM | POA: Diagnosis not present

## 2023-02-10 ENCOUNTER — Encounter: Payer: Self-pay | Admitting: Rheumatology

## 2023-02-10 ENCOUNTER — Ambulatory Visit: Payer: Medicare Other | Attending: Rheumatology | Admitting: Rheumatology

## 2023-02-10 VITALS — BP 149/89 | HR 89 | Resp 16 | Ht 66.0 in | Wt 211.0 lb

## 2023-02-10 DIAGNOSIS — M19042 Primary osteoarthritis, left hand: Secondary | ICD-10-CM

## 2023-02-10 DIAGNOSIS — E119 Type 2 diabetes mellitus without complications: Secondary | ICD-10-CM

## 2023-02-10 DIAGNOSIS — Z79899 Other long term (current) drug therapy: Secondary | ICD-10-CM | POA: Diagnosis not present

## 2023-02-10 DIAGNOSIS — Z8673 Personal history of transient ischemic attack (TIA), and cerebral infarction without residual deficits: Secondary | ICD-10-CM

## 2023-02-10 DIAGNOSIS — M170 Bilateral primary osteoarthritis of knee: Secondary | ICD-10-CM | POA: Diagnosis not present

## 2023-02-10 DIAGNOSIS — E559 Vitamin D deficiency, unspecified: Secondary | ICD-10-CM | POA: Diagnosis not present

## 2023-02-10 DIAGNOSIS — I340 Nonrheumatic mitral (valve) insufficiency: Secondary | ICD-10-CM

## 2023-02-10 DIAGNOSIS — I10 Essential (primary) hypertension: Secondary | ICD-10-CM

## 2023-02-10 DIAGNOSIS — Z87891 Personal history of nicotine dependence: Secondary | ICD-10-CM

## 2023-02-10 DIAGNOSIS — M19041 Primary osteoarthritis, right hand: Secondary | ICD-10-CM | POA: Diagnosis not present

## 2023-02-10 DIAGNOSIS — M7062 Trochanteric bursitis, left hip: Secondary | ICD-10-CM

## 2023-02-10 DIAGNOSIS — I7300 Raynaud's syndrome without gangrene: Secondary | ICD-10-CM | POA: Diagnosis not present

## 2023-02-10 DIAGNOSIS — Z9889 Other specified postprocedural states: Secondary | ICD-10-CM

## 2023-02-10 DIAGNOSIS — M797 Fibromyalgia: Secondary | ICD-10-CM

## 2023-02-10 DIAGNOSIS — M3219 Other organ or system involvement in systemic lupus erythematosus: Secondary | ICD-10-CM

## 2023-02-10 DIAGNOSIS — Z13820 Encounter for screening for osteoporosis: Secondary | ICD-10-CM | POA: Diagnosis not present

## 2023-02-10 DIAGNOSIS — M7061 Trochanteric bursitis, right hip: Secondary | ICD-10-CM

## 2023-02-10 NOTE — Patient Instructions (Signed)
Standing Labs We placed an order today for your standing lab work.   Please have your standing labs drawn in May and every 3 months  Please have your labs drawn 2 weeks prior to your appointment so that the provider can discuss your lab results at your appointment.  Please note that you may see your imaging and lab results in [facility] before we have reviewed them. We will contact you once all results are reviewed. Please allow our office up to 72 hours to thoroughly review all of the results before contacting the office for clarification of your results.  Lab hours are:   Monday through Thursday from 8:00 am -12:30 pm and 1:00 pm-5:00 pm and Friday from 8:00 am-12:00 pm.  Please be advised, all patients with office appointments requiring lab work will take precedent over walk-in lab work.   Labs are drawn by [facility]. Please bring your co-pay at the time of your lab draw.  You may receive a bill from [facility] for your lab work.  Please note if you are on Hydroxychloroquine and and an order has been placed for a Hydroxychloroquine level, you will need to have it drawn 4 hours or more after your last dose.  If you wish to have your labs drawn at another location, please call the office 24 hours in advance so we can fax the orders.  The office is located at [address], [place], [place], [place] [address] No appointment is necessary.    If you have any questions regarding directions or hours of operation,  please call [phone].   As a reminder, please drink plenty of water prior to coming for your lab work. Thanks!   Vaccines You are taking a medication(s) that can suppress your immune system.  The following immunizations are recommended: Flu annually Covid-19  RSV Td/Tdap (tetanus, diphtheria, pertussis) every 10 years Pneumonia (Prevnar 15 then Pneumovax 23 at least 1 year apart.  Alternatively, can take Prevnar 20 without needing additional dose) Shingrix: 2 doses from 4 weeks  to 6 months apart  Please check with your PCP to make sure you are up to date.   If you have signs or symptoms of an infection or start antibiotics: First, call your PCP for workup of your infection. Hold your medication through the infection, until you complete your antibiotics, and until symptoms resolve if you take the following: Injectable medication (Actemra, Benlysta, Cimzia, Cosentyx, Enbrel, Humira, Kevzara, Orencia, Remicade, Simponi, Stelara, Taltz, Tremfya) Methotrexate Leflunomide (Arava) Mycophenolate (Cellcept) Morrie Sheldon, Olumiant, or Rinvoq

## 2023-02-11 ENCOUNTER — Other Ambulatory Visit: Payer: Self-pay | Admitting: *Deleted

## 2023-02-11 DIAGNOSIS — E559 Vitamin D deficiency, unspecified: Secondary | ICD-10-CM

## 2023-02-11 LAB — C3 AND C4
C3 Complement: 192 mg/dL (ref 83–193)
C4 Complement: 30 mg/dL (ref 15–57)

## 2023-02-11 LAB — COMPLETE METABOLIC PANEL WITH GFR
AG Ratio: 1.3 (calc) (ref 1.0–2.5)
ALT: 10 U/L (ref 6–29)
AST: 15 U/L (ref 10–35)
Albumin: 4.7 g/dL (ref 3.6–5.1)
Alkaline phosphatase (APISO): 124 U/L (ref 37–153)
BUN: 14 mg/dL (ref 7–25)
CO2: 26 mmol/L (ref 20–32)
Calcium: 10.2 mg/dL (ref 8.6–10.4)
Chloride: 98 mmol/L (ref 98–110)
Creat: 0.84 mg/dL (ref 0.50–1.03)
Globulin: 3.6 g/dL (calc) (ref 1.9–3.7)
Glucose, Bld: 148 mg/dL — ABNORMAL HIGH (ref 65–99)
Potassium: 3.7 mmol/L (ref 3.5–5.3)
Sodium: 138 mmol/L (ref 135–146)
Total Bilirubin: 0.6 mg/dL (ref 0.2–1.2)
Total Protein: 8.3 g/dL — ABNORMAL HIGH (ref 6.1–8.1)
eGFR: 80 mL/min/{1.73_m2} (ref >=60)

## 2023-02-11 LAB — CBC WITH DIFFERENTIAL/PLATELET
Absolute Monocytes: 437 cells/uL (ref 200–950)
Basophils Absolute: 133 cells/uL (ref 0–200)
Basophils Relative: 1.4 %
Eosinophils Absolute: 285 cells/uL (ref 15–500)
Eosinophils Relative: 3 %
HCT: 45.8 % — ABNORMAL HIGH (ref 35.0–45.0)
Hemoglobin: 15.7 g/dL — ABNORMAL HIGH (ref 11.7–15.5)
Lymphs Abs: 2594 cells/uL (ref 850–3900)
MCH: 27.8 pg (ref 27.0–33.0)
MCHC: 34.3 g/dL (ref 32.0–36.0)
MCV: 81.2 fL (ref 80.0–100.0)
MPV: 9.3 fL (ref 7.5–12.5)
Monocytes Relative: 4.6 %
Neutro Abs: 6052 cells/uL (ref 1500–7800)
Neutrophils Relative %: 63.7 %
Platelets: 475 10*3/uL — ABNORMAL HIGH (ref 140–400)
RBC: 5.64 10*6/uL — ABNORMAL HIGH (ref 3.80–5.10)
RDW: 14.4 % (ref 11.0–15.0)
Total Lymphocyte: 27.3 %
WBC: 9.5 10*3/uL (ref 3.8–10.8)

## 2023-02-11 LAB — VITAMIN D 25 HYDROXY (VIT D DEFICIENCY, FRACTURES): Vit D, 25-Hydroxy: 16 ng/mL — ABNORMAL LOW (ref 30–100)

## 2023-02-11 LAB — SEDIMENTATION RATE: Sed Rate: 22 mm/h (ref 0–30)

## 2023-02-11 LAB — ANTI-DNA ANTIBODY, DOUBLE-STRANDED: ds DNA Ab: <1 IU/mL

## 2023-02-11 LAB — PROTEIN / CREATININE RATIO, URINE
Creatinine, Urine: 74 mg/dL (ref 20–275)
Protein/Creat Ratio: 68 mg/g creat (ref 24–184)
Protein/Creatinine Ratio: 0.068 mg/mg creat (ref 0.024–0.184)
Total Protein, Urine: 5 mg/dL (ref 5–24)

## 2023-02-11 LAB — SJOGRENS SYNDROME-A EXTRACTABLE NUCLEAR ANTIBODY: SSA (Ro) (ENA) Antibody, IgG: <1 AI

## 2023-02-11 LAB — SJOGRENS SYNDROME-B EXTRACTABLE NUCLEAR ANTIBODY: SSB (La) (ENA) Antibody, IgG: <1 AI

## 2023-02-11 MED ORDER — VITAMIN D (ERGOCALCIFEROL) 1.25 MG (50000 UNIT) PO CAPS: 50000.0000 [IU] | ORAL_CAPSULE | ORAL | 0 refills | Status: DC

## 2023-02-11 NOTE — Progress Notes (Signed)
Vitamin D is 16.  Please call in vitamin D 50,000 units twice a week for 3 months.  She should stay on vitamin D 5000 units daily after finishing the course of vitamin D.  Repeat vitamin D level in 3 months.

## 2023-02-13 NOTE — Progress Notes (Signed)
Hemoglobin is mildly elevated and platelets are mildly elevated.  Glucose is elevated at 148.  Urine protein is normal.  Ro and La antibody for Sjogren's are negative, double-stranded DNA negative, complements normal, sed rate normal.  Labs do not indicate autoimmune disease flare.  Please forward results to her PCP.

## 2023-02-17 ENCOUNTER — Ambulatory Visit (INDEPENDENT_AMBULATORY_CARE_PROVIDER_SITE_OTHER): Payer: Medicare Other | Admitting: Primary Care

## 2023-02-17 ENCOUNTER — Encounter: Payer: Self-pay | Admitting: Primary Care

## 2023-02-17 VITALS — BP 132/84 | HR 96 | Temp 98.2°F | Ht 66.0 in | Wt 212.0 lb

## 2023-02-17 DIAGNOSIS — F331 Major depressive disorder, recurrent, moderate: Secondary | ICD-10-CM | POA: Diagnosis not present

## 2023-02-17 DIAGNOSIS — F411 Generalized anxiety disorder: Secondary | ICD-10-CM

## 2023-02-17 DIAGNOSIS — Z23 Encounter for immunization: Secondary | ICD-10-CM | POA: Diagnosis not present

## 2023-02-17 DIAGNOSIS — M329 Systemic lupus erythematosus, unspecified: Secondary | ICD-10-CM

## 2023-02-17 DIAGNOSIS — M4727 Other spondylosis with radiculopathy, lumbosacral region: Secondary | ICD-10-CM | POA: Diagnosis not present

## 2023-02-17 DIAGNOSIS — E785 Hyperlipidemia, unspecified: Secondary | ICD-10-CM | POA: Diagnosis not present

## 2023-02-17 DIAGNOSIS — E1165 Type 2 diabetes mellitus with hyperglycemia: Secondary | ICD-10-CM

## 2023-02-17 DIAGNOSIS — Z0001 Encounter for general adult medical examination with abnormal findings: Secondary | ICD-10-CM

## 2023-02-17 DIAGNOSIS — Z1211 Encounter for screening for malignant neoplasm of colon: Secondary | ICD-10-CM

## 2023-02-17 DIAGNOSIS — D472 Monoclonal gammopathy: Secondary | ICD-10-CM

## 2023-02-17 DIAGNOSIS — M0579 Rheumatoid arthritis with rheumatoid factor of multiple sites without organ or systems involvement: Secondary | ICD-10-CM | POA: Diagnosis not present

## 2023-02-17 DIAGNOSIS — M797 Fibromyalgia: Secondary | ICD-10-CM | POA: Diagnosis not present

## 2023-02-17 DIAGNOSIS — IMO0002 Reserved for concepts with insufficient information to code with codable children: Secondary | ICD-10-CM

## 2023-02-17 DIAGNOSIS — G894 Chronic pain syndrome: Secondary | ICD-10-CM

## 2023-02-17 DIAGNOSIS — Z794 Long term (current) use of insulin: Secondary | ICD-10-CM | POA: Diagnosis not present

## 2023-02-17 DIAGNOSIS — M3500 Sicca syndrome, unspecified: Secondary | ICD-10-CM

## 2023-02-17 DIAGNOSIS — I10 Essential (primary) hypertension: Secondary | ICD-10-CM

## 2023-02-17 LAB — CBC WITH DIFFERENTIAL/PLATELET
Basophils Absolute: 0.4 10*3/uL — ABNORMAL HIGH (ref 0.0–0.1)
Basophils Relative: 4.6 % — ABNORMAL HIGH (ref 0.0–3.0)
Eosinophils Absolute: 1.2 10*3/uL — ABNORMAL HIGH (ref 0.0–0.7)
Eosinophils Relative: 13.1 % — ABNORMAL HIGH (ref 0.0–5.0)
HCT: 41.2 % (ref 36.0–46.0)
Hemoglobin: 14 g/dL (ref 12.0–15.0)
Lymphocytes Relative: 27.2 % (ref 12.0–46.0)
Lymphs Abs: 2.6 10*3/uL (ref 0.7–4.0)
MCHC: 34 g/dL (ref 30.0–36.0)
MCV: 82.7 fl (ref 78.0–100.0)
Monocytes Absolute: 0.4 10*3/uL (ref 0.1–1.0)
Monocytes Relative: 4.3 % (ref 3.0–12.0)
Neutro Abs: 4.8 10*3/uL (ref 1.4–7.7)
Neutrophils Relative %: 50.8 % (ref 43.0–77.0)
Platelets: 411 10*3/uL — ABNORMAL HIGH (ref 150.0–400.0)
RBC: 4.98 Mil/uL (ref 3.87–5.11)
RDW: 14.6 % (ref 11.5–15.5)
WBC: 9.4 10*3/uL (ref 4.0–10.5)

## 2023-02-17 LAB — LIPID PANEL
Cholesterol: 184 mg/dL (ref 0–200)
HDL: 42.7 mg/dL (ref >39.00)
LDL Cholesterol: 103 mg/dL — ABNORMAL HIGH (ref 0–99)
NonHDL: 140.87
Total CHOL/HDL Ratio: 4
Triglycerides: 187 mg/dL — ABNORMAL HIGH (ref 0.0–149.0)
VLDL: 37.4 mg/dL (ref 0.0–40.0)

## 2023-02-17 LAB — HEMOGLOBIN A1C: Hgb A1c MFr Bld: 8.1 % — ABNORMAL HIGH (ref 4.6–6.5)

## 2023-02-17 MED ORDER — SEMAGLUTIDE (2 MG/DOSE) 8 MG/3ML ~~LOC~~ SOPN: 2.0000 mg | PEN_INJECTOR | SUBCUTANEOUS | 1 refills | Status: DC

## 2023-02-17 MED ORDER — BUPROPION HCL ER (XL) 300 MG PO TB24: 300.0000 mg | ORAL_TABLET | Freq: Every day | ORAL | 0 refills | Status: DC

## 2023-02-17 NOTE — Assessment & Plan Note (Signed)
Following with rheumatology, office notes and labs reviewed from February 2024.  Repeat CBC pending. Add differential.  Continue methotrexate 17.5 mg weekly, hydroxychloroquine 200 mg BID.

## 2023-02-17 NOTE — Assessment & Plan Note (Signed)
Repeat A1C pending.  Continue Tresiba 100 unit/ml, 45 units daily; Ozempic 1 mg weekly.  Follow up in 3-6 months based on A1C result.

## 2023-02-17 NOTE — Assessment & Plan Note (Signed)
Deteriorated.  Unfortunately, she has failed multiple treatments including SSRI's, SNRI's and now Wellbutrin.  Referral placed to therapy and psychiatry. Increase Wellbutrin XL to 300 mg daily.  Continue Lexapro 20 mg daily.  Follow up with psychiatry.

## 2023-02-17 NOTE — Progress Notes (Signed)
Subjective:    Patient ID: Lauren Winters, female    DOB: 26-Jan-1966, 57 y.o.   MRN: [id number]  HPI  Lauren Winters is a very pleasant 57 y.o. female who presents today for complete physical and follow up of chronic conditions.  She would also like to discuss anxiety and depression. Symptoms include tearfulness, increased anxiety, little interest or pleasure in doing things. Her chronic pain has a lot to do with her symptoms. Currently managed on Lexapro 20 mg daily and bupropion XL 150 mg daily. Previously managed on Zoloft but she developed diarrhea which was intolerable. Previously managed on Cymbalta and Effexor which were either ineffective or made depression worse. Previously following with therapy and psychiatry.   Immunizations: -Tetanus: Completed in 2017 -Influenza: Completed this season -Shingles: Completed 1 Shingrix dose in march 2023 -Pneumonia: Completed in 2020  Diet: [place].  Exercise: No regular exercise.  Eye exam: Completes annually  Dental exam: Completed several years ago  Pap Smear: Hysterectomy  Mammogram: Completed in August 2023  Colonoscopy: Never completed.  Dexa: Completed in August 2023   BP Readings from Last 3 Encounters:  02/17/23 132/84  02/10/23 (!) 149/89  11/02/22 132/84       Review of Systems  Constitutional:  Negative for unexpected weight change.  HENT:  Negative for rhinorrhea.   Eyes:  Negative for visual disturbance.  Respiratory:  Positive for shortness of breath. Negative for cough.   Cardiovascular:  Negative for chest pain.  Gastrointestinal:  Negative for constipation and diarrhea.  Genitourinary:  Negative for difficulty urinating.  Musculoskeletal:  Positive for arthralgias and myalgias.  Skin:  Negative for rash.  Allergic/Immunologic: Negative for environmental allergies.  Neurological:  Positive for numbness and headaches. Negative for dizziness.  Psychiatric/Behavioral:  The patient is nervous/anxious.           Past Medical History:  Diagnosis Date   Anxiety    Atypical chest pain    a. ETT 05/2014: normal stress test without evidence of myocardial ischemia or chest pain at peak stress; b. 02/2021 Cor CTA: Ca2+ score = 0. Nl cors.   Complication of anesthesia    migraine headache   Depression    Diabetes mellitus without complication ([place]) metformin   dx 2015   Dyspnea    on exertion   Elevated C-reactive protein (CRP) 06/12/2021   Elevated d-dimer 06/10/2021   Elevated rheumatoid factor 06/10/2021   Fibromyalgia    Headache    Heart murmur    a. told back in 2007, but no mention with 2016 cardiac w/u; b. 02/2019 Echo: EF 60-65%, conc LVH. Nl RV fxn. Mild Ca2+ of MV. Triv AI.   Hyperlipidemia    Hypertension    Local infection of wound 02/07/2018   Lumbar radiculopathy    Lupus (systemic lupus erythematosus) (HCC)    Mitral regurgitation    a. 05/2014 Echo: Mild MR/AI; b. 05/2019 Echo: Mild Ca2+ of MV.   Obesity (BMI 30-39.9)    Palpitations    a. 02/2021 Zio: Predominantly SR @ 75 (44-167). 8 SVT runs (max 167 bpm, longest 10 beats). ? Atrial tach. Triggered events assoc w/ SVT. Otw rare isolated PACs/PVCs. No afib/flutter.   Positive ANA (antinuclear antibody) 06/10/2021   Raynaud's phenomenon    Rheumatoid arthritis ([place])    Rheumatoid factor positive 08/04/2015   84   S/P lumbar laminectomy 12/09/2017   S/P lumbar spinal fusion 05/26/2018   Sjogren's disease ([place])    Spinal stenosis  Spondylolisthesis of lumbar region    Stroke [facility])    Tobacco abuse    a. ongoing; b. ongoing SOB   Wound infection after surgery 01/11/2018    Social History   Socioeconomic History   Marital status: Married    Spouse name: Not on file   Number of children: Not on file   Years of education: Not on file   Highest education level: Not on file  Occupational History   Not on file  Tobacco Use   Smoking status: Former    Packs/day: 0.50    Years: 10.00    Total pack years:  5.00    Types: Cigarettes    Quit date: 07/22/2015    Years since quitting: 7.5    Passive exposure: Never   Smokeless tobacco: Never  Vaping Use   Vaping Use: Never used  Substance and Sexual Activity   Alcohol use: No    Alcohol/week: 0.0 standard drinks of alcohol   Drug use: No   Sexual activity: Not Currently  Other Topics Concern   Not on file  Social History Narrative   Married.   3 children.   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    Social Determinants of Health   Financial Resource Strain: Low Risk  (07/19/2022)   Overall Financial Resource Strain (CARDIA)    Difficulty of Paying Living Expenses: Not hard at all  Food Insecurity: No Food Insecurity (07/19/2022)   Hunger Vital Sign    Worried About Running Out of Food in the Last Year: Never true    Ran Out of Food in the Last Year: Never true  Transportation Needs: No Transportation Needs (07/19/2022)   PRAPARE - Hydrologist (Medical): No    Lack of Transportation (Non-Medical): No  Physical Activity: Insufficiently Active (07/19/2022)   Exercise Vital Sign    Days of Exercise per Week: 2 days    Minutes of Exercise per Session: 20 min  Stress: No Stress Concern Present (07/19/2022)   [place]    Feeling of Stress : Not at all  Social Connections: Moderately Isolated (07/19/2022)   Social Connection and Isolation Panel [NHANES]    Frequency of Communication with Friends and Family: More than three times a week    Frequency of Social Gatherings with Friends and Family: More than three times a week    Attends Religious Services: Never    Marine scientist or Organizations: No    Attends Archivist Meetings: Never    Marital Status: Married  Human resources officer Violence: Not At Risk (07/19/2022)   Humiliation, Afraid, Rape, and Kick questionnaire    Fear of Current or  Ex-Partner: No    Emotionally Abused: No    Physically Abused: No    Sexually Abused: No    Past Surgical History:  Procedure Laterality Date   ABDOMINAL HYSTERECTOMY     BACK SURGERY     BONE MARROW BIOPSY  10/2019   CESAREAN SECTION     x3   LUMBAR LAMINECTOMY     LUMBAR LAMINECTOMY/DECOMPRESSION MICRODISCECTOMY N/A 03/29/2017   Procedure: Re operative Right Lumbar Three-Four Laminectomy and discectomy with Left Lumbar Three-Four, Bilateral Lumbar Four-Five Laminectomy for decompression;  Surgeon: Kevan Ny Ditty, MD;  Location: [place];  Service: Neurosurgery;  Laterality: N/A;   SPINAL CORD STIMULATOR IMPLANT  11/30/2022   SPINAL CORD STIMULATOR INSERTION  12/09/2017  Procedure: LUMBAR SPINAL CORD STIMULATOR IMPLANT;  Surgeon: Eustace Moore, MD;  Location: [place];  Service: Neurosurgery;;   SPINAL CORD STIMULATOR INSERTION     06/18/2022 leads placed, 06/25/2022 battery placed   SPINAL CORD STIMULATOR INSERTION  06/25/2022   SPINAL CORD STIMULATOR REMOVAL N/A 01/11/2018   Procedure: LUMBAR SPINAL CORD STIMULATOR REMOVAL;  Surgeon: Eustace Moore, MD;  Location: [place];  Service: Neurosurgery;  Laterality: N/A;   TUBAL LIGATION      Family History  Problem Relation Age of Onset   CAD Father 63       CABG x 4   Liver cancer Father 83       passed   Valvular heart disease Father    Hypertension Father    CAD Paternal 59        CABG   Breast cancer Paternal Aunt    CAD Paternal Aunt        CABG   Breast cancer Paternal Aunt    CAD Paternal Aunt        CABG   Breast cancer Paternal Aunt    CAD Paternal Aunt        CABG   Breast cancer Paternal Aunt    CAD Paternal Aunt        CABG   Breast cancer Paternal Aunt    CAD Paternal Uncle        MI s/p CABG   Stroke Maternal Grandmother    Stroke Maternal Grandfather    CAD Maternal Grandfather    Heart Problems Brother     Allergies  Allergen Reactions   Pseudoephedrine Hcl Shortness Of Breath and Other (See  Comments)    Room starts spinning, can't breath   Ancef [Cefazolin] Other (See Comments)    Unknown   Clindamycin/Lincomycin Hives   Prednisone Other (See Comments)    Chest pain w/ cortisone shots or prednisone pills Elevated blood pressure   Triprolidine Hcl    Adhesive [Tape] Rash and Other (See Comments)    Sensitivity to certain band aids and adhesives - burns skin, makes skin raw   Other Rash and Other (See Comments)    Sensitivity to certain band aids and adhesives - burns skin, makes skin raw   Sudafed [Pseudoephedrine Hcl] Other (See Comments)    Dizziness     Current Outpatient Medications on File Prior to Visit  Medication Sig Dispense Refill   amLODipine (NORVASC) 10 MG tablet TAKE ONE TAB DAILY FOR BLOOD PRESSURE. 90 tablet 1   Blood Glucose Monitoring Suppl ([place]) w/Device KIT Use as instructed to test blood sugar 3 times daily 1 kit 0   clopidogrel (PLAVIX) 75 MG tablet TAKE 1 TABLET BY MOUTH EVERY DAY 90 tablet 1   Continuous Blood Gluc Receiver (FREESTYLE LIBRE 14 DAY READER) DEVI 1 Device by Does not apply route in the morning, at noon, and at bedtime. 1 each 0   Continuous Blood Gluc Sensor (FREESTYLE LIBRE 3 SENSOR) MISC 1 Device by Does not apply route continuous. Place 1 sensor on the skin every 14 days. Use to check glucose continuously 6 each 1   escitalopram (LEXAPRO) 20 MG tablet Take 1 tablet by mouth daily.     folic acid (FOLVITE) 1 MG tablet Take 2 tablets by mouth daily. 180 tablet 3   glucose blood test strip Use as instructed to blood sugar 3 times daily 300 each 2   hydroxychloroquine (PLAQUENIL) 200 MG tablet TAKE 1 TABLET  BY MOUTH TWICE A DAY 180 tablet 0   insulin degludec (TRESIBA FLEXTOUCH) 100 UNIT/ML FlexTouch Pen Inject 45 Units into the skin daily. for diabetes. 45 mL 0   Insulin Pen Needle (PEN NEEDLES) 31G X 6 MM MISC Use with insulin as directed. 100 each 0   methotrexate (RHEUMATREX) 2.5 MG tablet TAKE 7 TABLETS (17.5  MG TOTAL) BY MOUTH ONCE A WEEK. CAUTION:CHEMOTHERAPY. PROTECT FROM LIGHT. 84 tablet 0   ondansetron (ZOFRAN) 4 MG tablet TAKE 1 TABLET BY MOUTH EVERY 8 HOURS AS NEEDED FOR NAUSEA AND VOMITING 20 tablet 0   rosuvastatin (CRESTOR) 20 MG tablet TAKE 1 TABLET BY MOUTH DAILY. FOR CHOLESTEROL 90 tablet 0   Semaglutide, 1 MG/DOSE, (OZEMPIC, 1 MG/DOSE,) 4 MG/3ML SOPN INJECT 1 MG AS DIRECTED ONCE A WEEK. FOR DIABETES. 9 mL 0   spironolactone (ALDACTONE) 25 MG tablet Take 25 mg by mouth daily.     telmisartan-hydrochlorothiazide (MICARDIS HCT) 80-25 MG tablet Take 1 tablet by mouth daily. for blood pressure. 90 tablet 1   Vitamin D, Ergocalciferol, (DRISDOL) 1.25 MG (50000 UNIT) CAPS capsule Take 1 capsule (50,000 Units total) by mouth 2 (two) times a week. 24 capsule 0   metoprolol succinate (TOPROL-XL) 50 MG 24 hr tablet Take 1 tablet (50 mg total) by mouth daily. For blood pressure 90 tablet 3   Vitamin D, Ergocalciferol, (DRISDOL) 1.25 MG (50000 UNIT) CAPS capsule Take 1 capsule (50,000 Units total) by mouth every 7 (seven) days. (Patient not taking: Reported on 02/17/2023) 12 capsule 0   No current facility-administered medications on file prior to visit.    BP 132/84   Pulse 96   Temp 98.2 F (36.8 C) (Temporal)   Ht 5' 6"$  (1.676 m)   Wt 212 lb (96.2 kg)   SpO2 99%   BMI 34.22 kg/m  Objective:   Physical Exam HENT:     Right Ear: Tympanic membrane and ear canal normal.     Left Ear: Tympanic membrane and ear canal normal.     Nose: Nose normal.  Eyes:     Conjunctiva/sclera: Conjunctivae normal.     Pupils: Pupils are equal, round, and reactive to light.  Neck:     Thyroid: No thyromegaly.  Cardiovascular:     Rate and Rhythm: Normal rate and regular rhythm.     Heart sounds: No murmur heard. Pulmonary:     Effort: Pulmonary effort is normal.     Breath sounds: Normal breath sounds. No rales.  Abdominal:     General: Bowel sounds are normal.     Palpations: Abdomen is soft.      Tenderness: There is no abdominal tenderness.  Musculoskeletal:     Cervical back: Neck supple.     Comments: Decrease in ROM to lumbar spine. Ambulates with cane.  Lymphadenopathy:     Cervical: No cervical adenopathy.  Skin:    General: Skin is warm and dry.     Findings: No rash.  Neurological:     Mental Status: She is alert and oriented to person, place, and time.     Cranial Nerves: No cranial nerve deficit.     Deep Tendon Reflexes: Reflexes are normal and symmetric.  Psychiatric:        Mood and Affect: Mood normal.           Assessment & Plan:  Encounter for annual general medical examination with abnormal findings in adult Assessment & Plan: Immunizations UTD. Mammogram UTD Colonoscopy due, referral placed to  GI.  Discussed the importance of a healthy diet and regular exercise in order for weight loss, and to reduce the risk of further co-morbidity.  Exam stable. Labs pending.  Follow up in 1 year for repeat physical.    Type 2 diabetes mellitus with hyperglycemia, with long-term current use of insulin (HCC) Assessment & Plan: Repeat A1C pending.  Continue Tresiba 100 unit/ml, 45 units daily; Ozempic 1 mg weekly.  Follow up in 3-6 months based on A1C result.  Orders: -     Hemoglobin A1c  Essential hypertension Assessment & Plan: Controlled.  Continue amlodipine 10 mg, metoprolol succinate 50 mg daily, spironolactone 25 mg daily, telmisartan-HCTZ 80-25 mg daily.  CMP reviewed from February 2024.  Orders: -     CBC with Differential/Platelet  Lumbosacral spondylosis with radiculopathy Assessment & Plan: Following with neurosurgery and pain management.     Rheumatoid arthritis involving multiple sites with positive rheumatoid factor [facility]) Assessment & Plan: Following with rheumatology, office notes and labs reviewed from February 2024.  Repeat CBC pending. Add differential.  Continue methotrexate 17.5 mg weekly, hydroxychloroquine 200  mg BID.   Chronic pain syndrome Assessment & Plan: Following with pain management and neurosurgery.     Screening for colon cancer -     Ambulatory referral to Gastroenterology  Hyperlipidemia, unspecified hyperlipidemia type Assessment & Plan: Repeat lipid panel pending. Continue rosuvastatin 20 mg daily.  Discussed the importance of a healthy diet and regular exercise in order for weight loss, and to reduce the risk of further co-morbidity.   Orders: -     Lipid panel  GAD (generalized anxiety disorder) Assessment & Plan: Deteriorated.  Unfortunately, she has failed multiple treatments including SSRI's, SNRI's and now Wellbutrin.  Referral placed to therapy and psychiatry. Increase Wellbutrin XL to 300 mg daily.  Continue Lexapro 20 mg daily.  Follow up with psychiatry.   Orders: -     Ambulatory referral to Psychiatry -     Ambulatory referral to Psychology -     buPROPion HCl ER (XL); Take 1 tablet (300 mg total) by mouth daily. for anxiety and depression.  Dispense: 90 tablet; Refill: 0  Moderate episode of recurrent major depressive disorder ([place]) Assessment & Plan: Deteriorated.  Unfortunately, she has failed multiple treatments including SSRI's, SNRI's and now Wellbutrin.  Referral placed to therapy and psychiatry. Increase Wellbutrin XL to 300 mg daily.  Continue Lexapro 20 mg daily.  Follow up with psychiatry.   Orders: -     Ambulatory referral to Psychiatry -     Ambulatory referral to Psychology -     buPROPion HCl ER (XL); Take 1 tablet (300 mg total) by mouth daily. for anxiety and depression.  Dispense: 90 tablet; Refill: 0  Lupus ([place]) Assessment & Plan: Following with rheumatology. Office notes and labs reviewed from February 2024.  Repeat CBC with diff pending   Fibromyalgia Assessment & Plan: Following with rheumatology, reviewed office notes from February 2024.   Monoclonal gammopathy Assessment & Plan: Following with  hematology through [place], office notes reviewed from November 2023.   Sjogren's syndrome, with unspecified organ involvement [facility]) Assessment & Plan: Following with rheumatology, reviewed office notes and labs from February 2024.  Continue Plaquenil 200 mg BID.         Pleas Koch, NP

## 2023-02-17 NOTE — Addendum Note (Signed)
Addended by: Pat Kocher on: 02/17/2023 12:31 PM   Modules accepted: Orders

## 2023-02-17 NOTE — Assessment & Plan Note (Signed)
Following with pain management and neurosurgery.

## 2023-02-17 NOTE — Assessment & Plan Note (Signed)
Following with rheumatology. Office notes and labs reviewed from February 2024.  Repeat CBC with diff pending

## 2023-02-17 NOTE — Assessment & Plan Note (Signed)
Following with hematology through [facility], office notes reviewed from November 2023.

## 2023-02-17 NOTE — Assessment & Plan Note (Signed)
Controlled.  Continue amlodipine 10 mg, metoprolol succinate 50 mg daily, spironolactone 25 mg daily, telmisartan-HCTZ 80-25 mg daily.  CMP reviewed from February 2024.

## 2023-02-17 NOTE — Assessment & Plan Note (Signed)
Repeat lipid panel pending. Continue rosuvastatin 20 mg daily.  Discussed the importance of a healthy diet and regular exercise in order for weight loss, and to reduce the risk of further co-morbidity.

## 2023-02-17 NOTE — Assessment & Plan Note (Signed)
Following with rheumatology, reviewed office notes from February 2024.

## 2023-02-17 NOTE — Patient Instructions (Signed)
Stop by the lab prior to leaving today. I will notify you of your results once received.   We increased the dose of your Wellbutrin to XL 300 mg daily. I sent a new prescription.  You will either be contacted via phone regarding your referral to psychiatry and therapy, or you may receive a letter on your MyChart portal from our referral team with instructions for scheduling an appointment. Please let us know if you have not been contacted by anyone within two weeks.  It was a pleasure to see you today!

## 2023-02-17 NOTE — Assessment & Plan Note (Signed)
Following with rheumatology, reviewed office notes and labs from February 2024.  Continue Plaquenil 200 mg BID.

## 2023-02-17 NOTE — Assessment & Plan Note (Signed)
Following with neurosurgery and pain management.

## 2023-02-17 NOTE — Assessment & Plan Note (Signed)
Immunizations UTD. Mammogram UTD Colonoscopy due, referral placed to GI.  Discussed the importance of a healthy diet and regular exercise in order for weight loss, and to reduce the risk of further co-morbidity.  Exam stable. Labs pending.  Follow up in 1 year for repeat physical.

## 2023-02-23 DIAGNOSIS — Z9689 Presence of other specified functional implants: Secondary | ICD-10-CM | POA: Diagnosis not present

## 2023-02-23 DIAGNOSIS — G8929 Other chronic pain: Secondary | ICD-10-CM | POA: Diagnosis not present

## 2023-02-23 DIAGNOSIS — M545 Low back pain, unspecified: Secondary | ICD-10-CM | POA: Diagnosis not present

## 2023-02-23 DIAGNOSIS — Z981 Arthrodesis status: Secondary | ICD-10-CM | POA: Diagnosis not present

## 2023-02-25 ENCOUNTER — Encounter: Payer: Self-pay | Admitting: *Deleted

## 2023-03-04 DIAGNOSIS — M48061 Spinal stenosis, lumbar region without neurogenic claudication: Secondary | ICD-10-CM | POA: Diagnosis not present

## 2023-03-11 DIAGNOSIS — E1165 Type 2 diabetes mellitus with hyperglycemia: Secondary | ICD-10-CM | POA: Diagnosis not present

## 2023-03-28 DIAGNOSIS — I639 Cerebral infarction, unspecified: Secondary | ICD-10-CM

## 2023-03-28 MED ORDER — CLOPIDOGREL BISULFATE 75 MG PO TABS: 75.0000 mg | ORAL_TABLET | Freq: Every day | ORAL | 2 refills | Status: DC

## 2023-03-31 DIAGNOSIS — Z981 Arthrodesis status: Secondary | ICD-10-CM | POA: Diagnosis not present

## 2023-03-31 DIAGNOSIS — M545 Low back pain, unspecified: Secondary | ICD-10-CM | POA: Diagnosis not present

## 2023-03-31 DIAGNOSIS — G8929 Other chronic pain: Secondary | ICD-10-CM | POA: Diagnosis not present

## 2023-03-31 DIAGNOSIS — Z9689 Presence of other specified functional implants: Secondary | ICD-10-CM | POA: Diagnosis not present

## 2023-04-02 ENCOUNTER — Other Ambulatory Visit: Payer: Self-pay | Admitting: Primary Care

## 2023-04-02 DIAGNOSIS — I10 Essential (primary) hypertension: Secondary | ICD-10-CM

## 2023-04-03 ENCOUNTER — Other Ambulatory Visit: Payer: Self-pay | Admitting: Primary Care

## 2023-04-03 DIAGNOSIS — E1165 Type 2 diabetes mellitus with hyperglycemia: Secondary | ICD-10-CM

## 2023-04-04 ENCOUNTER — Other Ambulatory Visit: Payer: Self-pay | Admitting: Primary Care

## 2023-04-04 DIAGNOSIS — E1165 Type 2 diabetes mellitus with hyperglycemia: Secondary | ICD-10-CM

## 2023-04-10 DIAGNOSIS — E1165 Type 2 diabetes mellitus with hyperglycemia: Secondary | ICD-10-CM | POA: Diagnosis not present

## 2023-04-16 ENCOUNTER — Other Ambulatory Visit: Payer: Self-pay | Admitting: Primary Care

## 2023-04-16 DIAGNOSIS — F32A Depression, unspecified: Secondary | ICD-10-CM

## 2023-04-16 DIAGNOSIS — F419 Anxiety disorder, unspecified: Secondary | ICD-10-CM

## 2023-04-20 DIAGNOSIS — N182 Chronic kidney disease, stage 2 (mild): Secondary | ICD-10-CM | POA: Diagnosis not present

## 2023-04-20 DIAGNOSIS — I10 Essential (primary) hypertension: Secondary | ICD-10-CM | POA: Diagnosis not present

## 2023-04-25 DIAGNOSIS — I10 Essential (primary) hypertension: Secondary | ICD-10-CM | POA: Diagnosis not present

## 2023-04-25 DIAGNOSIS — M329 Systemic lupus erythematosus, unspecified: Secondary | ICD-10-CM | POA: Diagnosis not present

## 2023-05-05 ENCOUNTER — Other Ambulatory Visit: Payer: Self-pay | Admitting: Rheumatology

## 2023-05-05 NOTE — Telephone Encounter (Signed)
Last Fill: 10/06/2022  Labs: 02/17/2023 Platelets 411.0, Eosinophils Relative 13.1, Basophils Relative 4.6, Eosinophils Absolute 1.2, Basophils Absolute 0.4 04/20/2023 BMP normal  Next Visit: 07/26/2023  Last Visit: 02/10/2023  DX:  Other organ or system involvement in systemic lupus erythematosus   Current Dose per office note 02/10/2023: methotrexate 7 tablets by mouth once weekly   Okay to refill Methotrexate?

## 2023-05-20 DIAGNOSIS — G8929 Other chronic pain: Secondary | ICD-10-CM | POA: Diagnosis not present

## 2023-05-20 DIAGNOSIS — M5416 Radiculopathy, lumbar region: Secondary | ICD-10-CM | POA: Diagnosis not present

## 2023-05-26 DIAGNOSIS — M47812 Spondylosis without myelopathy or radiculopathy, cervical region: Secondary | ICD-10-CM | POA: Diagnosis not present

## 2023-05-26 DIAGNOSIS — Z981 Arthrodesis status: Secondary | ICD-10-CM | POA: Diagnosis not present

## 2023-05-26 DIAGNOSIS — M47814 Spondylosis without myelopathy or radiculopathy, thoracic region: Secondary | ICD-10-CM | POA: Diagnosis not present

## 2023-05-26 DIAGNOSIS — M47816 Spondylosis without myelopathy or radiculopathy, lumbar region: Secondary | ICD-10-CM | POA: Diagnosis not present

## 2023-05-26 DIAGNOSIS — M545 Low back pain, unspecified: Secondary | ICD-10-CM | POA: Diagnosis not present

## 2023-05-26 DIAGNOSIS — G8929 Other chronic pain: Secondary | ICD-10-CM | POA: Diagnosis not present

## 2023-05-26 DIAGNOSIS — M5441 Lumbago with sciatica, right side: Secondary | ICD-10-CM | POA: Diagnosis not present

## 2023-06-07 DIAGNOSIS — Z981 Arthrodesis status: Secondary | ICD-10-CM | POA: Diagnosis not present

## 2023-06-07 DIAGNOSIS — M5441 Lumbago with sciatica, right side: Secondary | ICD-10-CM | POA: Diagnosis not present

## 2023-06-07 DIAGNOSIS — G8929 Other chronic pain: Secondary | ICD-10-CM | POA: Diagnosis not present

## 2023-06-21 DIAGNOSIS — E1165 Type 2 diabetes mellitus with hyperglycemia: Secondary | ICD-10-CM | POA: Diagnosis not present

## 2023-06-26 ENCOUNTER — Other Ambulatory Visit: Payer: Self-pay | Admitting: Primary Care

## 2023-06-26 DIAGNOSIS — I10 Essential (primary) hypertension: Secondary | ICD-10-CM

## 2023-06-27 NOTE — Telephone Encounter (Signed)
Patient scheduled through mychart herself

## 2023-06-27 NOTE — Telephone Encounter (Signed)
Lvm for patient tcb and schedule 

## 2023-06-27 NOTE — Telephone Encounter (Signed)
Patient is due for diabetes follow up, this will be required prior to any further refills.  Please schedule, thank you!   

## 2023-07-04 DIAGNOSIS — G8929 Other chronic pain: Secondary | ICD-10-CM | POA: Diagnosis not present

## 2023-07-04 DIAGNOSIS — M5441 Lumbago with sciatica, right side: Secondary | ICD-10-CM | POA: Diagnosis not present

## 2023-07-04 DIAGNOSIS — Z981 Arthrodesis status: Secondary | ICD-10-CM | POA: Diagnosis not present

## 2023-07-08 DIAGNOSIS — M9983 Other biomechanical lesions of lumbar region: Secondary | ICD-10-CM | POA: Diagnosis not present

## 2023-07-08 DIAGNOSIS — G8929 Other chronic pain: Secondary | ICD-10-CM | POA: Diagnosis not present

## 2023-07-08 DIAGNOSIS — M4726 Other spondylosis with radiculopathy, lumbar region: Secondary | ICD-10-CM | POA: Diagnosis not present

## 2023-07-08 DIAGNOSIS — M48061 Spinal stenosis, lumbar region without neurogenic claudication: Secondary | ICD-10-CM | POA: Diagnosis not present

## 2023-07-08 DIAGNOSIS — M5441 Lumbago with sciatica, right side: Secondary | ICD-10-CM | POA: Diagnosis not present

## 2023-07-08 DIAGNOSIS — Z981 Arthrodesis status: Secondary | ICD-10-CM | POA: Diagnosis not present

## 2023-07-12 NOTE — Progress Notes (Deleted)
Office Visit Note  Patient: Lauren Winters             Date of Birth: 12/12/1966           MRN: [id number]             PCP: Doreene Nest, NP Referring: Doreene Nest, NP Visit Date: 07/26/2023 Occupation: @GUAROCC @  Subjective:  No chief complaint on file.   History of Present Illness: Lauren Winters is a 57 y.o. female ***     Activities of Daily Living:  Patient reports morning stiffness for *** {minute/hour:19697}.   Patient {ACTIONS;DENIES/REPORTS:21021675::"Denies"} nocturnal pain.  Difficulty dressing/grooming: {ACTIONS;DENIES/REPORTS:21021675::"Denies"} Difficulty climbing stairs: {ACTIONS;DENIES/REPORTS:21021675::"Denies"} Difficulty getting out of chair: {ACTIONS;DENIES/REPORTS:21021675::"Denies"} Difficulty using hands for taps, buttons, cutlery, and/or writing: {ACTIONS;DENIES/REPORTS:21021675::"Denies"}  No Rheumatology ROS completed.   PMFS History:  Patient Active Problem List   Diagnosis Date Noted   Post laminectomy syndrome 05/19/2022   Type 2 diabetes mellitus with hyperglycemia, with long-term current use of insulin (HCC) 12/08/2021   Encounter for pain management counseling 07/25/2021   History of infection of neurostimulator battery w/ removal of device 06/12/2021    Class: History of   DDD (degenerative disc disease), thoracic 06/12/2021   DDD (degenerative disc disease), cervical 06/12/2021   Osteoarthritis of hip (Right) 06/12/2021   Other idiopathic scoliosis, thoracolumbar region 06/12/2021   Fixation hardware in spine (L3-4 and L4-5) 06/12/2021   Chronic pain syndrome 06/10/2021   Pharmacologic therapy 06/10/2021   Disorder of skeletal system 06/10/2021   Problems influencing health status 06/10/2021   Vitamin D deficiency 06/10/2021   Chronic anticoagulation (Plavix) 06/10/2021   Chronic lower extremity pain (3ry area of Pain) (Right) 06/10/2021   Diabetic peripheral neuropathy (4th area of Pain) (HCC) 06/10/2021   Chronic,  intermittent, rib pain (8th area of Pain) (Bilateral) 06/10/2021   Thoracic radiculitis (T6) (intermittent) (Bilateral) 06/10/2021   Chronic neck pain (6th area of Pain) (posterior) (Midline) (R>L) 06/10/2021   Cervicalgia 06/10/2021   Chronic upper extremity pain (7th area of Pain) (Right) 06/10/2021   Monoclonal gammopathy 05/14/2020   Bilateral primary osteoarthritis of hip 10/19/2019   Paresthesias 03/02/2019   Chronic hand pain (Bilateral) 12/06/2018   Left arm weakness 10/11/2018   Numbness and tingling in left arm 10/11/2018   Essential hypertension 10/06/2018   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HCC) 10/06/2018   Stage 3 chronic kidney disease (HCC) 10/06/2018   History of stroke 08/19/2018   History of lumbar spinal fusion (L3-4, L4-5) 05/26/2018   High risk medication use 02/03/2018   Trochanteric bursitis 06/09/2017   Lumbar post-laminectomy syndrome (L3-4, L4-5) 06/09/2017   Rheumatoid arthritis involving multiple sites with positive rheumatoid factor (HCC) 06/09/2017   HNP (herniated nucleus pulposus), lumbar 03/29/2017   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   Chronic shoulder pain (Right) 02/18/2017   Herniation of nucleus pulposus of cervical intervertebral disc without myelopathy 02/09/2017   Lumbosacral spondylosis with radiculopathy 01/21/2017   Primary osteoarthritis of both hands 12/22/2016   Primary osteoarthritis of both knees 12/22/2016   Spondylosis of lumbar region without myelopathy or radiculopathy 12/22/2016   Fibromyalgia 12/22/2016   Encounter for annual general medical examination with abnormal findings in adult 04/19/2016   Chronic hip pain (2ry area of Pain) (Bilateral) 03/09/2016   Disorder of sacroiliac joint 02/23/2016   Chronic low back pain (1ry area of Pain) (Bilateral) (R>L) w/o sciatica 02/04/2016   Chronic mid back pain (5th area of Pain) (Bilateral) 12/17/2015   Displacement of  lumbar intervertebral disc without myelopathy  10/29/2015   Hyperlipidemia 08/29/2015   Lupus (HCC) 08/04/2015   Sjogren's syndrome (HCC) 08/04/2015   Mitral regurgitation    Atypical chest pain    Obesity (BMI 30-39.9)    GAD (generalized anxiety disorder)    MDD (major depressive disorder) 05/23/2014   Chest pain 05/23/2014    Past Medical History:  Diagnosis Date   Anxiety    Atypical chest pain    a. ETT 05/2014: normal stress test without evidence of myocardial ischemia or chest pain at peak stress; b. 02/2021 Cor CTA: Ca2+ score = 0. Nl cors.   Complication of anesthesia    migraine headache   Depression    Diabetes mellitus without complication (HCC) metformin   dx 2015   Dyspnea    on exertion   Elevated C-reactive protein (CRP) 06/12/2021   Elevated d-dimer 06/10/2021   Elevated rheumatoid factor 06/10/2021   Fibromyalgia    Headache    Heart murmur    a. told back in 2007, but no mention with 2016 cardiac w/u; b. 02/2019 Echo: EF 60-65%, conc LVH. Nl RV fxn. Mild Ca2+ of MV. Triv AI.   Hyperlipidemia    Hypertension    Local infection of wound 02/07/2018   Lumbar radiculopathy    Lupus (systemic lupus erythematosus) (HCC)    Mitral regurgitation    a. 05/2014 Echo: Mild MR/AI; b. 05/2019 Echo: Mild Ca2+ of MV.   Obesity (BMI 30-39.9)    Palpitations    a. 02/2021 Zio: Predominantly SR @ 75 (44-167). 8 SVT runs (max 167 bpm, longest 10 beats). ? Atrial tach. Triggered events assoc w/ SVT. Otw rare isolated PACs/PVCs. No afib/flutter.   Positive ANA (antinuclear antibody) 06/10/2021   Raynaud's phenomenon    Rheumatoid arthritis (HCC)    Rheumatoid factor positive 08/04/2015   82   S/P lumbar laminectomy 12/09/2017   S/P lumbar spinal fusion 05/26/2018   Sjogren's disease (HCC)    Spinal stenosis    Spondylolisthesis of lumbar region    Stroke [facility])    Tobacco abuse    a. ongoing; b. ongoing SOB   Wound infection after surgery 01/11/2018    Family History  Problem Relation Age of Onset   CAD Father 68        CABG x 4   Liver cancer Father 69       passed   Valvular heart disease Father    Hypertension Father    CAD Paternal Aunt        CABG   Breast cancer Paternal Aunt    CAD Paternal Aunt        CABG   Breast cancer Paternal Aunt    CAD Paternal Aunt        CABG   Breast cancer Paternal Aunt    CAD Paternal Aunt        CABG   Breast cancer Paternal Aunt    CAD Paternal Aunt        CABG   Breast cancer Paternal Aunt    CAD Paternal Uncle        MI s/p CABG   Stroke Maternal Grandmother    Stroke Maternal Grandfather    CAD Maternal Grandfather    Heart Problems Brother    Past Surgical History:  Procedure Laterality Date   ABDOMINAL HYSTERECTOMY     BACK SURGERY     BONE MARROW BIOPSY  10/2019   CESAREAN SECTION     x3  LUMBAR LAMINECTOMY     LUMBAR LAMINECTOMY/DECOMPRESSION MICRODISCECTOMY N/A 03/29/2017   Procedure: Re operative Right Lumbar Three-Four Laminectomy and discectomy with Left Lumbar Three-Four, Bilateral Lumbar Four-Five Laminectomy for decompression;  Surgeon: Loura Halt Ditty, MD;  Location: [facility] OR;  Service: Neurosurgery;  Laterality: N/A;   SPINAL CORD STIMULATOR IMPLANT  11/30/2022   SPINAL CORD STIMULATOR INSERTION  12/09/2017   Procedure: LUMBAR SPINAL CORD STIMULATOR IMPLANT;  Surgeon: Tia Alert, MD;  Location: [facility] OR;  Service: Neurosurgery;;   SPINAL CORD STIMULATOR INSERTION     06/18/2022 leads placed, 06/25/2022 battery placed   SPINAL CORD STIMULATOR INSERTION  06/25/2022   SPINAL CORD STIMULATOR REMOVAL N/A 01/11/2018   Procedure: LUMBAR SPINAL CORD STIMULATOR REMOVAL;  Surgeon: Tia Alert, MD;  Location: [facility] OR;  Service: Neurosurgery;  Laterality: N/A;   TUBAL LIGATION     Social History   Social History Narrative   Married.   3 children.   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    Immunization History  Administered Date(s) Administered   Covid-19, Mrna,Vaccine(Spikevax)64yrs and  older 11/08/2022   Influenza,inj,Quad PF,6+ Mos 10/19/2016, 10/21/2017, 01/31/2019, 10/19/2019, 12/08/2021, 11/08/2022   PFIZER(Purple Top)SARS-COV-2 Vaccination 04/17/2020, 05/08/2020   Pneumococcal Polysaccharide-23 10/27/2014, 06/25/2019   Td 04/19/2016   Zoster Recombinant(Shingrix) 03/26/2022, 02/17/2023   Zoster, Live 10/27/2014     Objective: Vital Signs: There were no vitals taken for this visit.   Physical Exam   Musculoskeletal Exam: ***  CDAI Exam: CDAI Score: -- Patient Global: --; Provider Global: -- Swollen: --; Tender: -- Joint Exam 07/26/2023   No joint exam has been documented for this visit   There is currently no information documented on the homunculus. Go to the Rheumatology activity and complete the homunculus joint exam.  Investigation: No additional findings.  Imaging: No results found.  Recent Labs: Lab Results  Component Value Date   WBC 9.4 02/17/2023   HGB 14.0 02/17/2023   PLT 411.0 (H) 02/17/2023   NA 138 02/10/2023   K 3.7 02/10/2023   CL 98 02/10/2023   CO2 26 02/10/2023   GLUCOSE 148 (H) 02/10/2023   BUN 14 02/10/2023   CREATININE 0.84 02/10/2023   BILITOT 0.6 02/10/2023   ALKPHOS 102 06/10/2021   AST 15 02/10/2023   ALT 10 02/10/2023   PROT 8.3 (H) 02/10/2023   ALBUMIN 4.4 06/10/2021   CALCIUM 10.2 02/10/2023   GFRAA 103 10/20/2020    Speciality Comments: PLQ Eye Exam: 11/17/2022 @ [facility] Follow up in 1 year  Procedures:  No procedures performed Allergies: Pseudoephedrine hcl, Ancef [cefazolin], Clindamycin/lincomycin, Prednisone, Triprolidine hcl, Adhesive [tape], Other, and Sudafed [pseudoephedrine hcl]   Assessment / Plan:     Visit Diagnoses: No diagnosis found.  Orders: No orders of the defined types were placed in this encounter.  No orders of the defined types were placed in this encounter.   Face-to-face time spent with patient was *** minutes. Greater than 50% of time was spent in counseling  and coordination of care.  Follow-Up Instructions: No follow-ups on file.   Ellen Henri, CMA  Note - This record has been created using Animal nutritionist.  Chart creation errors have been sought, but may not always  have been located. Such creation errors do not reflect on  the standard of medical care.

## 2023-07-20 DIAGNOSIS — M533 Sacrococcygeal disorders, not elsewhere classified: Secondary | ICD-10-CM | POA: Diagnosis not present

## 2023-07-20 DIAGNOSIS — Z981 Arthrodesis status: Secondary | ICD-10-CM | POA: Diagnosis not present

## 2023-07-20 DIAGNOSIS — M25551 Pain in right hip: Secondary | ICD-10-CM | POA: Diagnosis not present

## 2023-07-21 DIAGNOSIS — E1165 Type 2 diabetes mellitus with hyperglycemia: Secondary | ICD-10-CM | POA: Diagnosis not present

## 2023-07-25 NOTE — Progress Notes (Deleted)
Office Visit Note  Patient: Lauren Winters             Date of Birth: 10/21/1966           MRN: [id number]             PCP: Doreene Nest, NP Referring: Doreene Nest, NP Visit Date: 08/04/2023 Occupation: @GUAROCC @  Subjective:    History of Present Illness: Lauren Winters is a 56 y.o. female with history of systemic lupus and osteoarthritis.  Patient remains on Plaquenil 200 mg 1 tablet by mouth twice daily, methotrexate 7 tablets by mouth once weekly, folic acid 2mg  po by mouth daily.    Lab work from 02/10/23 was reviewed today in the office: Ro-, La-, dsDNA-, Complements WNL, ESR WNL.  The following lab work will be updated today.   PLQ Eye Exam: 11/17/2022 @ [facility] Follow up in 1 year    Activities of Daily Living:  Patient reports morning stiffness for *** {minute/hour:19697}.   Patient {ACTIONS;DENIES/REPORTS:21021675::"Denies"} nocturnal pain.  Difficulty dressing/grooming: {ACTIONS;DENIES/REPORTS:21021675::"Denies"} Difficulty climbing stairs: {ACTIONS;DENIES/REPORTS:21021675::"Denies"} Difficulty getting out of chair: {ACTIONS;DENIES/REPORTS:21021675::"Denies"} Difficulty using hands for taps, buttons, cutlery, and/or writing: {ACTIONS;DENIES/REPORTS:21021675::"Denies"}  No Rheumatology ROS completed.   PMFS History:  Patient Active Problem List   Diagnosis Date Noted   Post laminectomy syndrome 05/19/2022   Type 2 diabetes mellitus with hyperglycemia, with long-term current use of insulin (HCC) 12/08/2021   Encounter for pain management counseling 07/25/2021   History of infection of neurostimulator battery w/ removal of device 06/12/2021    Class: History of   DDD (degenerative disc disease), thoracic 06/12/2021   DDD (degenerative disc disease), cervical 06/12/2021   Osteoarthritis of hip (Right) 06/12/2021   Other idiopathic scoliosis, thoracolumbar region 06/12/2021   Fixation hardware in spine (L3-4 and L4-5) 06/12/2021    Chronic pain syndrome 06/10/2021   Pharmacologic therapy 06/10/2021   Disorder of skeletal system 06/10/2021   Problems influencing health status 06/10/2021   Vitamin D deficiency 06/10/2021   Chronic anticoagulation (Plavix) 06/10/2021   Chronic lower extremity pain (3ry area of Pain) (Right) 06/10/2021   Diabetic peripheral neuropathy (4th area of Pain) (HCC) 06/10/2021   Chronic, intermittent, rib pain (8th area of Pain) (Bilateral) 06/10/2021   Thoracic radiculitis (T6) (intermittent) (Bilateral) 06/10/2021   Chronic neck pain (6th area of Pain) (posterior) (Midline) (R>L) 06/10/2021   Cervicalgia 06/10/2021   Chronic upper extremity pain (7th area of Pain) (Right) 06/10/2021   Monoclonal gammopathy 05/14/2020   Bilateral primary osteoarthritis of hip 10/19/2019   Paresthesias 03/02/2019   Chronic hand pain (Bilateral) 12/06/2018   Left arm weakness 10/11/2018   Numbness and tingling in left arm 10/11/2018   Essential hypertension 10/06/2018   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HCC) 10/06/2018   Stage 3 chronic kidney disease (HCC) 10/06/2018   History of stroke 08/19/2018   History of lumbar spinal fusion (L3-4, L4-5) 05/26/2018   High risk medication use 02/03/2018   Trochanteric bursitis 06/09/2017   Lumbar post-laminectomy syndrome (L3-4, L4-5) 06/09/2017   Rheumatoid arthritis involving multiple sites with positive rheumatoid factor (HCC) 06/09/2017   HNP (herniated nucleus pulposus), lumbar 03/29/2017   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   Chronic shoulder pain (Right) 02/18/2017   Herniation of nucleus pulposus of cervical intervertebral disc without myelopathy 02/09/2017   Lumbosacral spondylosis with radiculopathy 01/21/2017   Primary osteoarthritis of both hands 12/22/2016   Primary osteoarthritis of both knees 12/22/2016   Spondylosis of lumbar region without  myelopathy or radiculopathy 12/22/2016   Fibromyalgia 12/22/2016   Encounter for annual  general medical examination with abnormal findings in adult 04/19/2016   Chronic hip pain (2ry area of Pain) (Bilateral) 03/09/2016   Disorder of sacroiliac joint 02/23/2016   Chronic low back pain (1ry area of Pain) (Bilateral) (R>L) w/o sciatica 02/04/2016   Chronic mid back pain (5th area of Pain) (Bilateral) 12/17/2015   Displacement of lumbar intervertebral disc without myelopathy 10/29/2015   Hyperlipidemia 08/29/2015   Lupus (HCC) 08/04/2015   Sjogren's syndrome (HCC) 08/04/2015   Mitral regurgitation    Atypical chest pain    Obesity (BMI 30-39.9)    GAD (generalized anxiety disorder)    MDD (major depressive disorder) 05/23/2014   Chest pain 05/23/2014    Past Medical History:  Diagnosis Date   Anxiety    Atypical chest pain    a. ETT 05/2014: normal stress test without evidence of myocardial ischemia or chest pain at peak stress; b. 02/2021 Cor CTA: Ca2+ score = 0. Nl cors.   Complication of anesthesia    migraine headache   Depression    Diabetes mellitus without complication (HCC) metformin   dx 2015   Dyspnea    on exertion   Elevated C-reactive protein (CRP) 06/12/2021   Elevated d-dimer 06/10/2021   Elevated rheumatoid factor 06/10/2021   Fibromyalgia    Headache    Heart murmur    a. told back in 2007, but no mention with 2016 cardiac w/u; b. 02/2019 Echo: EF 60-65%, conc LVH. Nl RV fxn. Mild Ca2+ of MV. Triv AI.   Hyperlipidemia    Hypertension    Local infection of wound 02/07/2018   Lumbar radiculopathy    Lupus (systemic lupus erythematosus) (HCC)    Mitral regurgitation    a. 05/2014 Echo: Mild MR/AI; b. 05/2019 Echo: Mild Ca2+ of MV.   Obesity (BMI 30-39.9)    Palpitations    a. 02/2021 Zio: Predominantly SR @ 75 (44-167). 8 SVT runs (max 167 bpm, longest 10 beats). ? Atrial tach. Triggered events assoc w/ SVT. Otw rare isolated PACs/PVCs. No afib/flutter.   Positive ANA (antinuclear antibody) 06/10/2021   Raynaud's phenomenon    Rheumatoid arthritis  (HCC)    Rheumatoid factor positive 08/04/2015   82   S/P lumbar laminectomy 12/09/2017   S/P lumbar spinal fusion 05/26/2018   Sjogren's disease (HCC)    Spinal stenosis    Spondylolisthesis of lumbar region    Stroke [facility])    Tobacco abuse    a. ongoing; b. ongoing SOB   Wound infection after surgery 01/11/2018    Family History  Problem Relation Age of Onset   CAD Father 56       CABG x 4   Liver cancer Father 59       passed   Valvular heart disease Father    Hypertension Father    CAD Paternal Aunt        CABG   Breast cancer Paternal Aunt    CAD Paternal Aunt        CABG   Breast cancer Paternal Aunt    CAD Paternal Aunt        CABG   Breast cancer Paternal Aunt    CAD Paternal Aunt        CABG   Breast cancer Paternal Aunt    CAD Paternal Aunt        CABG   Breast cancer Paternal Aunt    CAD Paternal Uncle  MI s/p CABG   Stroke Maternal Grandmother    Stroke Maternal Grandfather    CAD Maternal Grandfather    Heart Problems Brother    Past Surgical History:  Procedure Laterality Date   ABDOMINAL HYSTERECTOMY     BACK SURGERY     BONE MARROW BIOPSY  10/2019   CESAREAN SECTION     x3   LUMBAR LAMINECTOMY     LUMBAR LAMINECTOMY/DECOMPRESSION MICRODISCECTOMY N/A 03/29/2017   Procedure: Re operative Right Lumbar Three-Four Laminectomy and discectomy with Left Lumbar Three-Four, Bilateral Lumbar Four-Five Laminectomy for decompression;  Surgeon: Loura Halt Ditty, MD;  Location: [facility] OR;  Service: Neurosurgery;  Laterality: N/A;   SPINAL CORD STIMULATOR IMPLANT  11/30/2022   SPINAL CORD STIMULATOR INSERTION  12/09/2017   Procedure: LUMBAR SPINAL CORD STIMULATOR IMPLANT;  Surgeon: Tia Alert, MD;  Location: [facility] OR;  Service: Neurosurgery;;   SPINAL CORD STIMULATOR INSERTION     06/18/2022 leads placed, 06/25/2022 battery placed   SPINAL CORD STIMULATOR INSERTION  06/25/2022   SPINAL CORD STIMULATOR REMOVAL N/A 01/11/2018   Procedure: LUMBAR SPINAL  CORD STIMULATOR REMOVAL;  Surgeon: Tia Alert, MD;  Location: [facility] OR;  Service: Neurosurgery;  Laterality: N/A;   TUBAL LIGATION     Social History   Social History Narrative   Married.   3 children.   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    Immunization History  Administered Date(s) Administered   Covid-19, Mrna,Vaccine(Spikevax)64yrs and older 11/08/2022   Influenza,inj,Quad PF,6+ Mos 10/19/2016, 10/21/2017, 01/31/2019, 10/19/2019, 12/08/2021, 11/08/2022   PFIZER(Purple Top)SARS-COV-2 Vaccination 04/17/2020, 05/08/2020   Pneumococcal Polysaccharide-23 10/27/2014, 06/25/2019   Td 04/19/2016   Zoster Recombinant(Shingrix) 03/26/2022, 02/17/2023   Zoster, Live 10/27/2014     Objective: Vital Signs: There were no vitals taken for this visit.   Physical Exam Vitals and nursing note reviewed.  Constitutional:      Appearance: She is well-developed.  HENT:     Head: Normocephalic and atraumatic.  Eyes:     Conjunctiva/sclera: Conjunctivae normal.  Cardiovascular:     Rate and Rhythm: Normal rate and regular rhythm.     Heart sounds: Normal heart sounds.  Pulmonary:     Effort: Pulmonary effort is normal.     Breath sounds: Normal breath sounds.  Abdominal:     General: Bowel sounds are normal.     Palpations: Abdomen is soft.  Musculoskeletal:     Cervical back: Normal range of motion.  Lymphadenopathy:     Cervical: No cervical adenopathy.  Skin:    General: Skin is warm and dry.     Capillary Refill: Capillary refill takes less than 2 seconds.  Neurological:     Mental Status: She is alert and oriented to person, place, and time.  Psychiatric:        Behavior: Behavior normal.      Musculoskeletal Exam: ***  CDAI Exam: CDAI Score: -- Patient Global: --; Provider Global: -- Swollen: --; Tender: -- Joint Exam 08/04/2023   No joint exam has been documented for this visit   There is currently no information documented  on the homunculus. Go to the Rheumatology activity and complete the homunculus joint exam.  Investigation: No additional findings.  Imaging: No results found.  Recent Labs: Lab Results  Component Value Date   WBC 9.4 02/17/2023   HGB 14.0 02/17/2023   PLT 411.0 (H) 02/17/2023   NA 138 02/10/2023   K 3.7 02/10/2023   CL 98  02/10/2023   CO2 26 02/10/2023   GLUCOSE 148 (H) 02/10/2023   BUN 14 02/10/2023   CREATININE 0.84 02/10/2023   BILITOT 0.6 02/10/2023   ALKPHOS 102 06/10/2021   AST 15 02/10/2023   ALT 10 02/10/2023   PROT 8.3 (H) 02/10/2023   ALBUMIN 4.4 06/10/2021   CALCIUM 10.2 02/10/2023   GFRAA 103 10/20/2020    Speciality Comments: PLQ Eye Exam: 11/17/2022 @ [facility] Follow up in 1 year  Procedures:  No procedures performed Allergies: Pseudoephedrine hcl, Ancef [cefazolin], Clindamycin/lincomycin, Prednisone, Triprolidine hcl, Adhesive [tape], Other, and Sudafed [pseudoephedrine hcl]   Assessment / Plan:     Visit Diagnoses: Other organ or system involvement in systemic lupus erythematosus (HCC)  High risk medication use  Raynaud's syndrome without gangrene  Primary osteoarthritis of both hands  Primary osteoarthritis of both knees  Trochanteric bursitis of both hips  S/P lumbar laminectomy  Fibromyalgia  Vitamin D deficiency  History of CVA (cerebrovascular accident)  Mitral valve insufficiency, unspecified etiology  Former smoker  Essential hypertension  Orders: No orders of the defined types were placed in this encounter.  No orders of the defined types were placed in this encounter.   Face-to-face time spent with patient was *** minutes. Greater than 50% of time was spent in counseling and coordination of care.  Follow-Up Instructions: No follow-ups on file.   Gearldine Bienenstock, PA-C  Note - This record has been created using Dragon software.  Chart creation errors have been sought, but may not always  have been  located. Such creation errors do not reflect on  the standard of medical care.

## 2023-07-26 ENCOUNTER — Ambulatory Visit: Payer: Medicare Other | Admitting: Rheumatology

## 2023-07-26 DIAGNOSIS — Z9889 Other specified postprocedural states: Secondary | ICD-10-CM

## 2023-07-26 DIAGNOSIS — M3219 Other organ or system involvement in systemic lupus erythematosus: Secondary | ICD-10-CM

## 2023-07-26 DIAGNOSIS — M7061 Trochanteric bursitis, right hip: Secondary | ICD-10-CM

## 2023-07-26 DIAGNOSIS — I340 Nonrheumatic mitral (valve) insufficiency: Secondary | ICD-10-CM

## 2023-07-26 DIAGNOSIS — I7300 Raynaud's syndrome without gangrene: Secondary | ICD-10-CM

## 2023-07-26 DIAGNOSIS — Z87891 Personal history of nicotine dependence: Secondary | ICD-10-CM

## 2023-07-26 DIAGNOSIS — E119 Type 2 diabetes mellitus without complications: Secondary | ICD-10-CM

## 2023-07-26 DIAGNOSIS — M797 Fibromyalgia: Secondary | ICD-10-CM

## 2023-07-26 DIAGNOSIS — I10 Essential (primary) hypertension: Secondary | ICD-10-CM

## 2023-07-26 DIAGNOSIS — M170 Bilateral primary osteoarthritis of knee: Secondary | ICD-10-CM

## 2023-07-26 DIAGNOSIS — M19041 Primary osteoarthritis, right hand: Secondary | ICD-10-CM

## 2023-07-26 DIAGNOSIS — E559 Vitamin D deficiency, unspecified: Secondary | ICD-10-CM

## 2023-07-26 DIAGNOSIS — Z13820 Encounter for screening for osteoporosis: Secondary | ICD-10-CM

## 2023-07-26 DIAGNOSIS — Z8673 Personal history of transient ischemic attack (TIA), and cerebral infarction without residual deficits: Secondary | ICD-10-CM

## 2023-07-26 DIAGNOSIS — Z79899 Other long term (current) drug therapy: Secondary | ICD-10-CM

## 2023-07-27 ENCOUNTER — Other Ambulatory Visit: Payer: Self-pay | Admitting: Primary Care

## 2023-07-27 DIAGNOSIS — E1165 Type 2 diabetes mellitus with hyperglycemia: Secondary | ICD-10-CM

## 2023-08-01 DIAGNOSIS — G8929 Other chronic pain: Secondary | ICD-10-CM | POA: Diagnosis not present

## 2023-08-01 DIAGNOSIS — M5441 Lumbago with sciatica, right side: Secondary | ICD-10-CM | POA: Diagnosis not present

## 2023-08-04 ENCOUNTER — Ambulatory Visit: Payer: Medicare Other | Admitting: Physician Assistant

## 2023-08-04 DIAGNOSIS — Z13820 Encounter for screening for osteoporosis: Secondary | ICD-10-CM

## 2023-08-04 DIAGNOSIS — Z79899 Other long term (current) drug therapy: Secondary | ICD-10-CM

## 2023-08-04 DIAGNOSIS — M3219 Other organ or system involvement in systemic lupus erythematosus: Secondary | ICD-10-CM

## 2023-08-04 DIAGNOSIS — Z8673 Personal history of transient ischemic attack (TIA), and cerebral infarction without residual deficits: Secondary | ICD-10-CM

## 2023-08-04 DIAGNOSIS — Z87891 Personal history of nicotine dependence: Secondary | ICD-10-CM

## 2023-08-04 DIAGNOSIS — M797 Fibromyalgia: Secondary | ICD-10-CM

## 2023-08-04 DIAGNOSIS — M7061 Trochanteric bursitis, right hip: Secondary | ICD-10-CM

## 2023-08-04 DIAGNOSIS — M170 Bilateral primary osteoarthritis of knee: Secondary | ICD-10-CM

## 2023-08-04 DIAGNOSIS — E559 Vitamin D deficiency, unspecified: Secondary | ICD-10-CM

## 2023-08-04 DIAGNOSIS — I340 Nonrheumatic mitral (valve) insufficiency: Secondary | ICD-10-CM

## 2023-08-04 DIAGNOSIS — M19041 Primary osteoarthritis, right hand: Secondary | ICD-10-CM

## 2023-08-04 DIAGNOSIS — Z9889 Other specified postprocedural states: Secondary | ICD-10-CM

## 2023-08-04 DIAGNOSIS — I10 Essential (primary) hypertension: Secondary | ICD-10-CM

## 2023-08-04 DIAGNOSIS — I7300 Raynaud's syndrome without gangrene: Secondary | ICD-10-CM

## 2023-08-08 NOTE — Progress Notes (Signed)
Office Visit Note  Patient: Lauren Winters             Date of Birth: [birth year]           MRN: [id number]             PCP: Doreene Nest, NP Referring: Doreene Nest, NP Visit Date: 08/16/2023 Occupation: @GUAROCC @  Subjective:  Upcoming spinal fusion   History of Present Illness: ERMAL LOM is a 57 y.o. female with history of systemic lupus and osteoarthritis.  Patient remains on Plaquenil 200 mg 1 tablet by mouth twice daily, methotrexate 7 tablets by mouth once weekly, folic acid 2mg  po by mouth daily.  She is tolerating combination therapy without any side effects and has not missed any doses recently.  Patient states that she has noticed an increased hair loss over the past several months as well as has occasional sores in her mouth.  She states her sicca symptoms have been tolerable.  She denies any recent rashes.  She has been trying to avoid direct sun exposure and wearing sunscreen on a regular basis.  She has intermittent symptoms of Raynaud's phenomenon but denies any digital ulcers.  Patient continues to have chronic pain in both hands and the left knee joint.  She has been using a cane to assist with ambulation.  Patient states that she will be undergoing spinal fusion in mid October 2024 which will be performed by Dr. Tildon Husky.  She remains under the care of pain management.  She denies any recent or recurrent infections.    Activities of Daily Living:  Patient reports morning stiffness for all day. Patient Reports nocturnal pain.  Difficulty dressing/grooming: Reports Difficulty climbing stairs: Reports Difficulty getting out of chair: Reports Difficulty using hands for taps, buttons, cutlery, and/or writing: Reports  Review of Systems  Constitutional:  Positive for fatigue.  HENT:  Positive for mouth sores and mouth dryness.   Eyes:  Positive for dryness.  Respiratory:  Negative for cough and wheezing.   Cardiovascular:  Negative for chest pain  and palpitations.  Gastrointestinal:  Negative for blood in stool, constipation and diarrhea.  Endocrine: Positive for increased urination.  Genitourinary:  Positive for involuntary urination.  Musculoskeletal:  Positive for joint pain, gait problem, joint pain, muscle weakness and morning stiffness. Negative for joint swelling, myalgias, muscle tenderness and myalgias.  Skin:  Positive for color change and hair loss. Negative for rash and sensitivity to sunlight.  Allergic/Immunologic: Negative for susceptible to infections.  Neurological:  Positive for headaches. Negative for dizziness.  Hematological:  Positive for swollen glands.  Psychiatric/Behavioral:  Positive for depressed mood and sleep disturbance. The patient is nervous/anxious.     PMFS History:  Patient Active Problem List   Diagnosis Date Noted   Post laminectomy syndrome 05/19/2022   Type 2 diabetes mellitus with hyperglycemia, with long-term current use of insulin (HCC) 12/08/2021   Encounter for pain management counseling 07/25/2021   History of infection of neurostimulator battery w/ removal of device 06/12/2021    Class: History of   DDD (degenerative disc disease), thoracic 06/12/2021   DDD (degenerative disc disease), cervical 06/12/2021   Osteoarthritis of hip (Right) 06/12/2021   Other idiopathic scoliosis, thoracolumbar region 06/12/2021   Fixation hardware in spine (L3-4 and L4-5) 06/12/2021   Chronic pain syndrome 06/10/2021   Pharmacologic therapy 06/10/2021   Disorder of skeletal system 06/10/2021   Problems influencing health status 06/10/2021   Vitamin D deficiency 06/10/2021  Chronic anticoagulation (Plavix) 06/10/2021   Chronic lower extremity pain (3ry area of Pain) (Right) 06/10/2021   Diabetic peripheral neuropathy (4th area of Pain) (HCC) 06/10/2021   Chronic, intermittent, rib pain (8th area of Pain) (Bilateral) 06/10/2021   Thoracic radiculitis (T6) (intermittent) (Bilateral) 06/10/2021    Chronic neck pain (6th area of Pain) (posterior) (Midline) (R>L) 06/10/2021   Cervicalgia 06/10/2021   Chronic upper extremity pain (7th area of Pain) (Right) 06/10/2021   Monoclonal gammopathy 05/14/2020   Bilateral primary osteoarthritis of hip 10/19/2019   Paresthesias 03/02/2019   Chronic hand pain (Bilateral) 12/06/2018   Left arm weakness 10/11/2018   Numbness and tingling in left arm 10/11/2018   Essential hypertension 10/06/2018   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HCC) 10/06/2018   Stage 3 chronic kidney disease (HCC) 10/06/2018   History of stroke 08/19/2018   History of lumbar spinal fusion (L3-4, L4-5) 05/26/2018   High risk medication use 02/03/2018   Trochanteric bursitis 06/09/2017   Lumbar post-laminectomy syndrome (L3-4, L4-5) 06/09/2017   Rheumatoid arthritis involving multiple sites with positive rheumatoid factor (HCC) 06/09/2017   HNP (herniated nucleus pulposus), lumbar 03/29/2017   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   Chronic shoulder pain (Right) 02/18/2017   Herniation of nucleus pulposus of cervical intervertebral disc without myelopathy 02/09/2017   Lumbosacral spondylosis with radiculopathy 01/21/2017   Primary osteoarthritis of both hands 12/22/2016   Primary osteoarthritis of both knees 12/22/2016   Spondylosis of lumbar region without myelopathy or radiculopathy 12/22/2016   Fibromyalgia 12/22/2016   Encounter for annual general medical examination with abnormal findings in adult 04/19/2016   Chronic hip pain (2ry area of Pain) (Bilateral) 03/09/2016   Disorder of sacroiliac joint 02/23/2016   Chronic low back pain (1ry area of Pain) (Bilateral) (R>L) w/o sciatica 02/04/2016   Chronic mid back pain (5th area of Pain) (Bilateral) 12/17/2015   Displacement of lumbar intervertebral disc without myelopathy 10/29/2015   Hyperlipidemia 08/29/2015   Lupus (HCC) 08/04/2015   Sjogren's syndrome (HCC) 08/04/2015   Mitral regurgitation     Atypical chest pain    Obesity (BMI 30-39.9)    GAD (generalized anxiety disorder)    MDD (major depressive disorder) 05/23/2014   Chest pain 05/23/2014    Past Medical History:  Diagnosis Date   Anxiety    Atypical chest pain    a. ETT 05/2014: normal stress test without evidence of myocardial ischemia or chest pain at peak stress; b. 02/2021 Cor CTA: Ca2+ score = 0. Nl cors.   Complication of anesthesia    migraine headache   Depression    Diabetes mellitus without complication (HCC) metformin   dx 2015   Dyspnea    on exertion   Elevated C-reactive protein (CRP) 06/12/2021   Elevated d-dimer 06/10/2021   Elevated rheumatoid factor 06/10/2021   Fibromyalgia    Headache    Heart murmur    a. told back in 2007, but no mention with 2016 cardiac w/u; b. 02/2019 Echo: EF 60-65%, conc LVH. Nl RV fxn. Mild Ca2+ of MV. Triv AI.   Hyperlipidemia    Hypertension    Local infection of wound 02/07/2018   Lumbar radiculopathy    Lupus (systemic lupus erythematosus) (HCC)    Mitral regurgitation    a. 05/2014 Echo: Mild MR/AI; b. 05/2019 Echo: Mild Ca2+ of MV.   Obesity (BMI 30-39.9)    Palpitations    a. 02/2021 Zio: Predominantly SR @ 75 (44-167). 8 SVT runs (max 167 bpm, longest  10 beats). ? Atrial tach. Triggered events assoc w/ SVT. Otw rare isolated PACs/PVCs. No afib/flutter.   Positive ANA (antinuclear antibody) 06/10/2021   Raynaud's phenomenon    Rheumatoid arthritis (HCC)    Rheumatoid factor positive 08/04/2015   84   S/P lumbar laminectomy 12/09/2017   S/P lumbar spinal fusion 05/26/2018   Sjogren's disease (HCC)    Spinal stenosis    Spondylolisthesis of lumbar region    Stroke [facility])    Tobacco abuse    a. ongoing; b. ongoing SOB   Wound infection after surgery 01/11/2018    Family History  Problem Relation Age of Onset   CAD Father 44       CABG x 4   Liver cancer Father 72       passed   Valvular heart disease Father    Hypertension Father    CAD Paternal Aunt         CABG   Breast cancer Paternal Aunt    CAD Paternal Aunt        CABG   Breast cancer Paternal Aunt    CAD Paternal Aunt        CABG   Breast cancer Paternal Aunt    CAD Paternal Aunt        CABG   Breast cancer Paternal Aunt    CAD Paternal Aunt        CABG   Breast cancer Paternal Aunt    CAD Paternal Uncle        MI s/p CABG   Stroke Maternal Grandmother    Stroke Maternal Grandfather    CAD Maternal Grandfather    Heart Problems Brother    Past Surgical History:  Procedure Laterality Date   ABDOMINAL HYSTERECTOMY     BACK SURGERY     BONE MARROW BIOPSY  10/2019   CESAREAN SECTION     x3   LUMBAR LAMINECTOMY     LUMBAR LAMINECTOMY/DECOMPRESSION MICRODISCECTOMY N/A 03/29/2017   Procedure: Re operative Right Lumbar Three-Four Laminectomy and discectomy with Left Lumbar Three-Four, Bilateral Lumbar Four-Five Laminectomy for decompression;  Surgeon: Loura Halt Ditty, MD;  Location: [facility] OR;  Service: Neurosurgery;  Laterality: N/A;   SPINAL CORD STIMULATOR IMPLANT  11/30/2022   SPINAL CORD STIMULATOR INSERTION  12/09/2017   Procedure: LUMBAR SPINAL CORD STIMULATOR IMPLANT;  Surgeon: Tia Alert, MD;  Location: [facility] OR;  Service: Neurosurgery;;   SPINAL CORD STIMULATOR INSERTION     06/18/2022 leads placed, 06/25/2022 battery placed   SPINAL CORD STIMULATOR INSERTION  06/25/2022   SPINAL CORD STIMULATOR REMOVAL N/A 01/11/2018   Procedure: LUMBAR SPINAL CORD STIMULATOR REMOVAL;  Surgeon: Tia Alert, MD;  Location: [facility] OR;  Service: Neurosurgery;  Laterality: N/A;   TUBAL LIGATION     Social History   Social History Narrative   Married.   3 children.   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    Immunization History  Administered Date(s) Administered   Covid-19, Mrna,Vaccine(Spikevax)64yrs and older 11/08/2022   Influenza,inj,Quad PF,6+ Mos 10/19/2016, 10/21/2017, 01/31/2019, 10/19/2019, 12/08/2021, 11/08/2022   PFIZER(Purple  Top)SARS-COV-2 Vaccination 04/17/2020, 05/08/2020   Pneumococcal Polysaccharide-23 10/27/2014, 06/25/2019   Td 04/19/2016   Zoster Recombinant(Shingrix) 03/26/2022, 02/17/2023   Zoster, Live 10/27/2014     Objective: Vital Signs: BP 138/84 (BP Location: Left Arm, Patient Position: Sitting, Cuff Size: Normal)   Pulse 71   Resp 16   Ht 5\' 6"  (1.676 m)   Wt 202 lb 9.6 oz (  91.9 kg)   BMI 32.70 kg/m    Physical Exam Vitals and nursing note reviewed.  Constitutional:      Appearance: She is well-developed.  HENT:     Head: Normocephalic and atraumatic.  Eyes:     Conjunctiva/sclera: Conjunctivae normal.  Cardiovascular:     Rate and Rhythm: Normal rate and regular rhythm.     Heart sounds: Normal heart sounds.  Pulmonary:     Effort: Pulmonary effort is normal.     Breath sounds: Normal breath sounds.  Abdominal:     General: Bowel sounds are normal.     Palpations: Abdomen is soft.  Musculoskeletal:     Cervical back: Normal range of motion.  Lymphadenopathy:     Cervical: No cervical adenopathy.  Skin:    General: Skin is warm and dry.     Capillary Refill: Capillary refill takes less than 2 seconds.  Neurological:     Mental Status: She is alert and oriented to person, place, and time.  Psychiatric:        Behavior: Behavior normal.      Musculoskeletal Exam: Patient remained seated during examination today.  C-spine has limited range of motion.  Limited mobility of lumbar spine.  Shoulder joints, elbow joints, wrist joints, MCPs, PIPs, DIPs have good range of motion with no synovitis.  Complete fist formation bilaterally.  Hip joints difficult to assess in seated position.  Both knee joints have good range of motion with some discomfort in the left knee.  Ankle joints have good range of motion with no tenderness or joint swelling.  CDAI Exam: CDAI Score: -- Patient Global: --; Provider Global: -- Swollen: --; Tender: -- Joint Exam 08/16/2023   No joint exam has  been documented for this visit   There is currently no information documented on the homunculus. Go to the Rheumatology activity and complete the homunculus joint exam.  Investigation: No additional findings.  Imaging: No results found.  Recent Labs: Lab Results  Component Value Date   WBC 9.4 02/17/2023   HGB 14.0 02/17/2023   PLT 411.0 (H) 02/17/2023   NA 138 02/10/2023   K 3.7 02/10/2023   CL 98 02/10/2023   CO2 26 02/10/2023   GLUCOSE 148 (H) 02/10/2023   BUN 14 02/10/2023   CREATININE 0.84 02/10/2023   BILITOT 0.6 02/10/2023   ALKPHOS 102 06/10/2021   AST 15 02/10/2023   ALT 10 02/10/2023   PROT 8.3 (H) 02/10/2023   ALBUMIN 4.4 06/10/2021   CALCIUM 10.2 02/10/2023   GFRAA 103 10/20/2020    Speciality Comments: PLQ Eye Exam: 11/17/2022 @ [facility] Follow up in 1 year  Procedures:  No procedures performed Allergies: Pseudoephedrine hcl, Ancef [cefazolin], Clindamycin/lincomycin, Prednisone, Triprolidine hcl, Adhesive [tape], Other, and Sudafed [pseudoephedrine hcl]     Assessment / Plan:     Visit Diagnoses: Other organ or system involvement in systemic lupus erythematosus (HCC) - +SSB, +RF,oral ulcers, malar rash, fatigue, photosensitivity, Raynaud's, Subcutaneous lupus-bx+, hair loss, sicca symptoms: She has not had any signs or symptoms of a systemic lupus flare.  She has clinically been doing well taking Plaquenil 200 mg 1 tablet by mouth twice daily and methotrexate 7 tablets by mouth once weekly.  She is tolerating combination therapy without any interruptions.  Patient continues to have ongoing hair loss, sicca symptoms, and intermittent oral ulcers.  She has noticed intermittent cervical lymphadenopathy especially on the left side but no swollen lymph nodes were palpable today.  Discussed that  we can schedule ultrasound of the neck in the future if she continues to have recurrent bouts of cervical lymphadenopathy.  Discussed that the lymphadenopathy  could be secondary to systemic lupus but if it continues despite not having a lupus flare further evaluation may be warranted.  Discussed warning signs to monitor for including fevers, night sweats, and increased fatigue. She has no synovitis on examination today.  She has been trying to avoid direct sun exposure.  No recent rashes.  She has not had any symptoms of Raynaud's phenomenon recently. Lab work from 02/10/23 was reviewed today in the office: Ro-, La-, dsDNA-, Complements WNL, ESR WNL.  The following lab work will be updated today.  Patient will remain on Plaquenil and methotrexate as prescribed.  She requested a refill of Zofran which she takes as needed if she has nausea with her weekly methotrexate dosing.  Patient was advised notify us if she develops signs or symptoms of a flare.  She will follow-up in the office in 5 months or sooner if needed.  - Plan: CBC with Differential/Platelet, COMPLETE METABOLIC PANEL WITH GFR, Protein / creatinine ratio, urine, Anti-DNA antibody, double-stranded, C3 and C4, Sedimentation rate  High risk medication use - Plaquenil 200 mg 1 tablet by mouth twice daily, methotrexate 7 tablets by mouth once weekly, folic acid 2mg  po qd. PLQ Eye Exam: 11/17/2022 @ [facility] Follow up in 1 year. CBC and CMP were drawn on 02/10/2023.  - Plan: CBC with Differential/Platelet, COMPLETE METABOLIC PANEL WITH GFR  Raynaud's syndrome without gangrene: She has intermittent symptoms of Raynaud's phenomenon.  Good cap refill noted on examination today.  No digital ulcerations or signs of gangrene were noted.  Her symptoms have been infrequent and tolerable.  Primary osteoarthritis of both hands: Patient continues to have chronic pain in both hands.  No synovitis noted on examination today.  Sed rate was within normal limits on 02/10/2023.  Plan to update sed rate today.  Primary osteoarthritis of both knees: Patient has been experiencing increased discomfort in the left  knee joint.  No warmth or effusion noted today.  Using a cane to assist with ambulation.  Trochanteric bursitis of both hips: Not currently symptomatic.  S/P lumbar laminectomy: Under the care of pain management and neurosurgery.  Chronic pain.  She will be undergoing upcoming spinal fusion in October 2024, which will be performed by Dr. Tildon Husky (plan to extend fusion 1 level to include L2-L3). Advised patient to hold methotrexate at least 2-week prior to surgery.  She will require clearance by her surgeon prior to resuming methotrexate if there is no signs of delayed healing or in signs of infection.  Fibromyalgia: Patient has generalized hyperalgesia and positive tender points on exam.  Patient remains under the care of pain management.  Osteoporosis screening - Previous DEXA scan on 05/04/17 was normal done by her PCP.  DEXA updated on 8/23.23: the BMD measured at Femur Neck Left is 0.962 g/cm2 with a T-score of -0.5. She is taking a vitamin D supplement daily.  Vitamin D deficiency -Patient has been taking over-the-counter vitamin D supplement after completing the prescription vitamin D 50,000 units once weekly.  Plan to recheck vitamin D level today.  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  Mitral valve insufficiency, unspecified etiology  Diabetes mellitus without complication (HCC)  Essential hypertension: Blood pressure was 138/84 today in the  office.  Former smoker  Orders: Orders Placed This Encounter  Procedures   CBC with Differential/Platelet   COMPLETE METABOLIC PANEL WITH GFR   Protein / creatinine ratio, urine   Anti-DNA antibody, double-stranded   C3 and C4   Sedimentation rate   VITAMIN D 25 Hydroxy (Vit-D Deficiency, Fractures)   No orders of the defined types were placed in this encounter.    Follow-Up  Instructions: Return in about 5 months (around 01/16/2024) for Systemic lupus erythematosus, Osteoarthritis.   Gearldine Bienenstock, PA-C  Note - This record has been created using Dragon software.  Chart creation errors have been sought, but may not always  have been located. Such creation errors do not reflect on  the standard of medical care.

## 2023-08-11 ENCOUNTER — Encounter (INDEPENDENT_AMBULATORY_CARE_PROVIDER_SITE_OTHER): Payer: Self-pay

## 2023-08-16 ENCOUNTER — Ambulatory Visit: Payer: Medicare Other | Attending: Rheumatology | Admitting: Physician Assistant

## 2023-08-16 ENCOUNTER — Other Ambulatory Visit: Payer: Self-pay

## 2023-08-16 ENCOUNTER — Encounter: Payer: Self-pay | Admitting: Physician Assistant

## 2023-08-16 VITALS — BP 138/84 | HR 71 | Resp 16 | Ht 66.0 in | Wt 202.6 lb

## 2023-08-16 DIAGNOSIS — M19041 Primary osteoarthritis, right hand: Secondary | ICD-10-CM

## 2023-08-16 DIAGNOSIS — I7300 Raynaud's syndrome without gangrene: Secondary | ICD-10-CM | POA: Diagnosis not present

## 2023-08-16 DIAGNOSIS — M3219 Other organ or system involvement in systemic lupus erythematosus: Secondary | ICD-10-CM

## 2023-08-16 DIAGNOSIS — E559 Vitamin D deficiency, unspecified: Secondary | ICD-10-CM

## 2023-08-16 DIAGNOSIS — Z9889 Other specified postprocedural states: Secondary | ICD-10-CM | POA: Diagnosis not present

## 2023-08-16 DIAGNOSIS — Z79899 Other long term (current) drug therapy: Secondary | ICD-10-CM

## 2023-08-16 DIAGNOSIS — M797 Fibromyalgia: Secondary | ICD-10-CM | POA: Diagnosis not present

## 2023-08-16 DIAGNOSIS — Z13820 Encounter for screening for osteoporosis: Secondary | ICD-10-CM

## 2023-08-16 DIAGNOSIS — M7061 Trochanteric bursitis, right hip: Secondary | ICD-10-CM | POA: Diagnosis not present

## 2023-08-16 DIAGNOSIS — M19042 Primary osteoarthritis, left hand: Secondary | ICD-10-CM

## 2023-08-16 DIAGNOSIS — M7062 Trochanteric bursitis, left hip: Secondary | ICD-10-CM

## 2023-08-16 DIAGNOSIS — M170 Bilateral primary osteoarthritis of knee: Secondary | ICD-10-CM

## 2023-08-16 DIAGNOSIS — E119 Type 2 diabetes mellitus without complications: Secondary | ICD-10-CM

## 2023-08-16 DIAGNOSIS — I340 Nonrheumatic mitral (valve) insufficiency: Secondary | ICD-10-CM | POA: Diagnosis not present

## 2023-08-16 DIAGNOSIS — I10 Essential (primary) hypertension: Secondary | ICD-10-CM

## 2023-08-16 DIAGNOSIS — Z8673 Personal history of transient ischemic attack (TIA), and cerebral infarction without residual deficits: Secondary | ICD-10-CM

## 2023-08-16 DIAGNOSIS — Z87891 Personal history of nicotine dependence: Secondary | ICD-10-CM

## 2023-08-16 MED ORDER — ONDANSETRON HCL 4 MG PO TABS: 4.0000 mg | ORAL_TABLET | Freq: Three times a day (TID) | ORAL | 0 refills | Status: DC | PRN

## 2023-08-16 NOTE — Telephone Encounter (Signed)
Please review and sign pended zofran refill. Thanks!

## 2023-08-17 LAB — CBC WITH DIFFERENTIAL/PLATELET
Absolute Monocytes: 402 {cells}/uL (ref 200–950)
Basophils Absolute: 69 {cells}/uL (ref 0–200)
Basophils Relative: 0.7 %
Eosinophils Absolute: 216 {cells}/uL (ref 15–500)
Eosinophils Relative: 2.2 %
HCT: 41.4 % (ref 35.0–45.0)
Hemoglobin: 14.1 g/dL (ref 11.7–15.5)
Lymphs Abs: 2323 {cells}/uL (ref 850–3900)
MCH: 28.9 pg (ref 27.0–33.0)
MCHC: 34.1 g/dL (ref 32.0–36.0)
MCV: 84.8 fL (ref 80.0–100.0)
MPV: 9.4 fL (ref 7.5–12.5)
Monocytes Relative: 4.1 %
Neutro Abs: 6791 {cells}/uL (ref 1500–7800)
Neutrophils Relative %: 69.3 %
Platelets: 446 10*3/uL — ABNORMAL HIGH (ref 140–400)
RBC: 4.88 10*6/uL (ref 3.80–5.10)
RDW: 12.9 % (ref 11.0–15.0)
Total Lymphocyte: 23.7 %
WBC: 9.8 10*3/uL (ref 3.8–10.8)

## 2023-08-17 LAB — COMPLETE METABOLIC PANEL WITH GFR
AG Ratio: 1.3 (calc) (ref 1.0–2.5)
ALT: 10 U/L (ref 6–29)
AST: 14 U/L (ref 10–35)
Albumin: 4.4 g/dL (ref 3.6–5.1)
Alkaline phosphatase (APISO): 106 U/L (ref 37–153)
BUN: 11 mg/dL (ref 7–25)
CO2: 31 mmol/L (ref 20–32)
Calcium: 9.8 mg/dL (ref 8.6–10.4)
Chloride: 101 mmol/L (ref 98–110)
Creat: 0.95 mg/dL (ref 0.50–1.03)
Globulin: 3.3 g/dL (ref 1.9–3.7)
Glucose, Bld: 136 mg/dL — ABNORMAL HIGH (ref 65–99)
Potassium: 4.3 mmol/L (ref 3.5–5.3)
Sodium: 139 mmol/L (ref 135–146)
Total Bilirubin: 0.6 mg/dL (ref 0.2–1.2)
Total Protein: 7.7 g/dL (ref 6.1–8.1)
eGFR: 69 mL/min/{1.73_m2} (ref >=60)

## 2023-08-17 LAB — C3 AND C4
C3 Complement: 180 mg/dL (ref 83–193)
C4 Complement: 25 mg/dL (ref 15–57)

## 2023-08-17 LAB — SEDIMENTATION RATE: Sed Rate: 31 mm/h — ABNORMAL HIGH (ref 0–30)

## 2023-08-17 LAB — PROTEIN / CREATININE RATIO, URINE
Creatinine, Urine: 479 mg/dL — ABNORMAL HIGH (ref 20–275)
Protein/Creat Ratio: 150 mg/g{creat} (ref 24–184)
Protein/Creatinine Ratio: 0.15 mg/mg{creat} (ref 0.024–0.184)
Total Protein, Urine: 72 mg/dL — ABNORMAL HIGH (ref 5–24)

## 2023-08-17 LAB — ANTI-DNA ANTIBODY, DOUBLE-STRANDED: ds DNA Ab: <1 [IU]/mL

## 2023-08-17 LAB — VITAMIN D 25 HYDROXY (VIT D DEFICIENCY, FRACTURES): Vit D, 25-Hydroxy: 19 ng/mL — ABNORMAL LOW (ref 30–100)

## 2023-08-18 ENCOUNTER — Other Ambulatory Visit: Payer: Self-pay | Admitting: *Deleted

## 2023-08-18 DIAGNOSIS — I7300 Raynaud's syndrome without gangrene: Secondary | ICD-10-CM

## 2023-08-18 DIAGNOSIS — E559 Vitamin D deficiency, unspecified: Secondary | ICD-10-CM

## 2023-08-18 DIAGNOSIS — Z79899 Other long term (current) drug therapy: Secondary | ICD-10-CM

## 2023-08-18 DIAGNOSIS — M3509 Sicca syndrome with other organ involvement: Secondary | ICD-10-CM

## 2023-08-18 MED ORDER — VITAMIN D (ERGOCALCIFEROL) 1.25 MG (50000 UNIT) PO CAPS: 50000.0000 [IU] | ORAL_CAPSULE | ORAL | 0 refills | Status: DC

## 2023-08-18 NOTE — Progress Notes (Signed)
dsDNA is negative. Complements WNL. ESR is borderline elevated-31.   Vitamin D is low-19. Recommend taking vitamin D 50,000 units once weekly x3 months. Return to recheck vitamin D in 3 months.   Platelets remain elevated-stable-446K.  Rest of CBC WNL.   Glucose is 136.  Rest of CMP WNL. Total urine protein is elevated-72. Protein creatinine ratio WNL. Recommend rechecking protein creatinine ratio in 1 month

## 2023-08-20 DIAGNOSIS — E1165 Type 2 diabetes mellitus with hyperglycemia: Secondary | ICD-10-CM | POA: Diagnosis not present

## 2023-08-25 ENCOUNTER — Other Ambulatory Visit: Payer: Self-pay | Admitting: Physician Assistant

## 2023-08-25 ENCOUNTER — Other Ambulatory Visit: Payer: Self-pay | Admitting: Primary Care

## 2023-08-25 DIAGNOSIS — E785 Hyperlipidemia, unspecified: Secondary | ICD-10-CM

## 2023-08-25 DIAGNOSIS — F331 Major depressive disorder, recurrent, moderate: Secondary | ICD-10-CM

## 2023-08-25 DIAGNOSIS — F411 Generalized anxiety disorder: Secondary | ICD-10-CM

## 2023-08-25 NOTE — Telephone Encounter (Signed)
Last Fill: 01/31/2023  Eye exam: 11/17/2022   Labs: 08/16/2023 Platelets remain elevated-stable-446K.  Rest of CBC WNL.   Glucose is 136.  Rest of CMP WNL.  Next Visit: 01/16/2024  Last Visit: 08/16/2023  DX:Other organ or system involvement in systemic lupus erythematosus   Current Dose per office note 08/16/2023: Plaquenil 200 mg 1 tablet by mouth twice daily   Okay to refill Plaquenil?

## 2023-09-01 ENCOUNTER — Encounter: Payer: Self-pay | Admitting: Primary Care

## 2023-09-01 ENCOUNTER — Ambulatory Visit (INDEPENDENT_AMBULATORY_CARE_PROVIDER_SITE_OTHER): Payer: Medicare Other | Admitting: Primary Care

## 2023-09-01 VITALS — BP 126/72 | HR 64 | Temp 97.3°F | Ht 66.0 in | Wt 205.0 lb

## 2023-09-01 DIAGNOSIS — E1165 Type 2 diabetes mellitus with hyperglycemia: Secondary | ICD-10-CM | POA: Diagnosis not present

## 2023-09-01 DIAGNOSIS — Z794 Long term (current) use of insulin: Secondary | ICD-10-CM

## 2023-09-01 DIAGNOSIS — Z23 Encounter for immunization: Secondary | ICD-10-CM | POA: Diagnosis not present

## 2023-09-01 MED ORDER — FREESTYLE LIBRE 3 SENSOR MISC: 1 refills | Status: AC

## 2023-09-01 MED ORDER — TRESIBA FLEXTOUCH 100 UNIT/ML ~~LOC~~ SOPN
50.0000 [IU] | PEN_INJECTOR | Freq: Every day | SUBCUTANEOUS | Status: DC
Start: 2023-09-01 — End: 2024-03-03

## 2023-09-01 NOTE — Progress Notes (Signed)
Subjective:    Patient ID: Lauren Winters, female    DOB: 1966-12-04, 57 y.o.   MRN: [id number]  HPI  Lauren Winters is a very pleasant 57 y.o. female with a history of hypertension, type 2 diabetes, rheumatoid arthritis, Lupus, CKD, fibromyalgia, chronic back pain, who presents today for follow-up of diabetes.  Current medications include: Semaglutide 2 mg weekly, Tresiba 50 units daily.   She is checking her blood glucose continuously with CGM and is in range 95% of the time. She denies episodes of hypoglycemia.   Last A1C: 8.1 in February 24, 6.5 in August 2024 Last Eye Exam: UTD Last Foot Exam: Due Pneumonia Vaccination: Completed in 2020 Urine Microalbumin: UTD Statin: rosuvastatin   Dietary changes since last visit: Watching portion sizes and overall diet.    Exercise: None.   BP Readings from Last 3 Encounters:  09/01/23 126/72  08/16/23 138/84  02/17/23 132/84      Review of Systems  Respiratory:  Negative for shortness of breath.   Cardiovascular:  Negative for chest pain.  Musculoskeletal:  Positive for back pain.  Neurological:  Positive for numbness. Negative for dizziness.         Past Medical History:  Diagnosis Date   Anxiety    Atypical chest pain    a. ETT 05/2014: normal stress test without evidence of myocardial ischemia or chest pain at peak stress; b. 02/2021 Cor CTA: Ca2+ score = 0. Nl cors.   Complication of anesthesia    migraine headache   Depression    Diabetes mellitus without complication (HCC) metformin   dx 2015   Dyspnea    on exertion   Elevated C-reactive protein (CRP) 06/12/2021   Elevated d-dimer 06/10/2021   Elevated rheumatoid factor 06/10/2021   Fibromyalgia    Headache    Heart murmur    a. told back in 2007, but no mention with 2016 cardiac w/u; b. 02/2019 Echo: EF 60-65%, conc LVH. Nl RV fxn. Mild Ca2+ of MV. Triv AI.   Hyperlipidemia    Hypertension    Local infection of wound 02/07/2018   Lumbar  radiculopathy    Lupus (systemic lupus erythematosus) (HCC)    Mitral regurgitation    a. 05/2014 Echo: Mild MR/AI; b. 05/2019 Echo: Mild Ca2+ of MV.   Obesity (BMI 30-39.9)    Palpitations    a. 02/2021 Zio: Predominantly SR @ 75 (44-167). 8 SVT runs (max 167 bpm, longest 10 beats). ? Atrial tach. Triggered events assoc w/ SVT. Otw rare isolated PACs/PVCs. No afib/flutter.   Positive ANA (antinuclear antibody) 06/10/2021   Raynaud's phenomenon    Rheumatoid arthritis (HCC)    Rheumatoid factor positive 08/04/2015   84   S/P lumbar laminectomy 12/09/2017   S/P lumbar spinal fusion 05/26/2018   Sjogren's disease (HCC)    Spinal stenosis    Spondylolisthesis of lumbar region    Stroke Lauren Winters)    Tobacco abuse    a. ongoing; b. ongoing SOB   Wound infection after surgery 01/11/2018    Social History   Socioeconomic History   Marital status: Married    Spouse name: Not on file   Number of children: Not on file   Years of education: Not on file   Highest education level: Not on file  Occupational History   Not on file  Tobacco Use   Smoking status: Former    Current packs/day: 0.00    Average packs/day: 0.5 packs/day for 10.0 years (5.0 ttl  pk-yrs)    Types: Cigarettes    Start date: 07/21/2005    Quit date: 07/22/2015    Years since quitting: 8.1    Passive exposure: Never   Smokeless tobacco: Never  Vaping Use   Vaping status: Never Used  Substance and Sexual Activity   Alcohol use: No    Alcohol/week: 0.0 standard drinks of alcohol   Drug use: No   Sexual activity: Not Currently  Other Topics Concern   Not on file  Social History Narrative   Married.   3 children.   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    Social Determinants of Health   Financial Resource Strain: Low Risk  (07/04/2023)   Received from [facility]   Overall Financial Resource Strain (CARDIA)    Difficulty of Paying Living Expenses: Not hard at  all  Food Insecurity: No Food Insecurity (07/04/2023)   Received from [facility]   Hunger Vital Sign    Worried About Running Out of Food in the Last Year: Never true    Ran Out of Food in the Last Year: Never true  Transportation Needs: No Transportation Needs (07/04/2023)   Received from [facility] - Transportation    In the past 12 months, has lack of transportation kept you from medical appointments or from getting medications?: No    Lack of Transportation (Non-Medical): No  Physical Activity: Insufficiently Active (07/19/2022)   Exercise Vital Sign    Days of Exercise per Week: 2 days    Minutes of Exercise per Session: 20 min  Stress: No Stress Concern Present (07/19/2022)   [employer] of Occupational Health - Occupational Stress Questionnaire    Feeling of Stress : Not at all  Social Connections: Moderately Isolated (07/19/2022)   Social Connection and Isolation Panel [NHANES]    Frequency of Communication with Friends and Family: More than three times a week    Frequency of Social Gatherings with Friends and Family: More than three times a week    Attends Religious Services: Never    Database administrator or Organizations: No    Attends Banker Meetings: Never    Marital Status: Married  Catering manager Violence: Not At Risk (07/19/2022)   Humiliation, Afraid, Rape, and Kick questionnaire    Fear of Current or Ex-Partner: No    Emotionally Abused: No    Physically Abused: No    Sexually Abused: No    Past Surgical History:  Procedure Laterality Date   ABDOMINAL HYSTERECTOMY     BACK SURGERY     BONE MARROW BIOPSY  10/2019   CESAREAN SECTION     x3   LUMBAR LAMINECTOMY     LUMBAR LAMINECTOMY/DECOMPRESSION MICRODISCECTOMY N/A 03/29/2017   Procedure: Re operative Right Lumbar Three-Four Laminectomy and discectomy with Left Lumbar Three-Four, Bilateral Lumbar Four-Five Laminectomy for decompression;   Surgeon: Loura Halt Ditty, MD;  Location: [facility] OR;  Service: Neurosurgery;  Laterality: N/A;   SPINAL CORD STIMULATOR IMPLANT  11/30/2022   SPINAL CORD STIMULATOR INSERTION  12/09/2017   Procedure: LUMBAR SPINAL CORD STIMULATOR IMPLANT;  Surgeon: Tia Alert, MD;  Location: [facility] OR;  Service: Neurosurgery;;   SPINAL CORD STIMULATOR INSERTION     06/18/2022 leads placed, 06/25/2022 battery placed   SPINAL CORD STIMULATOR INSERTION  06/25/2022   SPINAL CORD STIMULATOR REMOVAL N/A 01/11/2018   Procedure: LUMBAR SPINAL CORD STIMULATOR REMOVAL;  Surgeon: Yetta Barre,  Kermit Balo, MD;  Location: [facility] OR;  Service: Neurosurgery;  Laterality: N/A;   TUBAL LIGATION      Family History  Problem Relation Age of Onset   CAD Father 33       CABG x 4   Liver cancer Father 15       passed   Valvular heart disease Father    Hypertension Father    CAD Paternal Aunt        CABG   Breast cancer Paternal Aunt    CAD Paternal Aunt        CABG   Breast cancer Paternal Aunt    CAD Paternal Aunt        CABG   Breast cancer Paternal Aunt    CAD Paternal Aunt        CABG   Breast cancer Paternal Aunt    CAD Paternal Aunt        CABG   Breast cancer Paternal Aunt    CAD Paternal Uncle        MI s/p CABG   Stroke Maternal Grandmother    Stroke Maternal Grandfather    CAD Maternal Grandfather    Heart Problems Brother     Allergies  Allergen Reactions   Pseudoephedrine Hcl Shortness Of Breath and Other (See Comments)    Room starts spinning, can't breath   Ancef [Cefazolin] Other (See Comments)    Unknown   Clindamycin/Lincomycin Hives   Prednisone Other (See Comments)    Chest pain w/ cortisone shots or prednisone pills Elevated blood pressure   Triprolidine Hcl    Adhesive [Tape] Rash and Other (See Comments)    Sensitivity to certain band aids and adhesives - burns skin, makes skin raw   Other Rash and Other (See Comments)    Sensitivity to certain band aids and adhesives - burns skin, makes skin  raw   Sudafed [Pseudoephedrine Hcl] Other (See Comments)    Dizziness     Current Outpatient Medications on File Prior to Visit  Medication Sig Dispense Refill   amLODipine (NORVASC) 10 MG tablet TAKE ONE TAB DAILY FOR BLOOD PRESSURE. 90 tablet 1   Blood Glucose Monitoring Suppl (ONETOUCH VERIO FLEX SYSTEM) w/Device KIT Use as instructed to test blood sugar 3 times daily 1 kit 0   buPROPion (WELLBUTRIN XL) 300 MG 24 hr tablet TAKE 1 TABLET (300 MG TOTAL) BY MOUTH DAILY. FOR ANXIETY AND DEPRESSION. 90 tablet 1   clopidogrel (PLAVIX) 75 MG tablet Take 1 tablet (75 mg total) by mouth daily. 90 tablet 2   Continuous Blood Gluc Receiver (FREESTYLE LIBRE 14 DAY READER) DEVI 1 Device by Does not apply route in the morning, at noon, and at bedtime. 1 each 0   escitalopram (LEXAPRO) 20 MG tablet Take 1 tablet by mouth daily.     folic acid (FOLVITE) 1 MG tablet Take 2 tablets by mouth daily. 180 tablet 3   glucose blood test strip Use as instructed to blood sugar 3 times daily 300 each 2   hydroxychloroquine (PLAQUENIL) 200 MG tablet TAKE 1 TABLET BY MOUTH TWICE A DAY 180 tablet 0   Insulin Pen Needle (PEN NEEDLES) 31G X 6 MM MISC Use with insulin as directed. 100 each 0   methotrexate (RHEUMATREX) 2.5 MG tablet TAKE 7 TABLETS (17.5 MG TOTAL) BY MOUTH ONCE A WEEK. CAUTION:CHEMOTHERAPY. PROTECT FROM LIGHT. 84 tablet 0   ondansetron (ZOFRAN) 4 MG tablet Take 1 tablet (4 mg total) by mouth every 8 (  eight) hours as needed for nausea or vomiting. 20 tablet 0   rosuvastatin (CRESTOR) 20 MG tablet TAKE 1 TABLET BY MOUTH EVERY DAY FOR CHOLESTEROL 90 tablet 1   Semaglutide, 2 MG/DOSE, 8 MG/3ML SOPN Inject 2 mg as directed once a week. for diabetes. 9 mL 1   spironolactone (ALDACTONE) 25 MG tablet Take 25 mg by mouth daily.     telmisartan-hydrochlorothiazide (MICARDIS HCT) 80-25 MG tablet Take 1 tablet by mouth daily. for blood pressure. 90 tablet 2   VITAMIN D PO Take by mouth daily.     Vitamin D,  Ergocalciferol, (DRISDOL) 1.25 MG (50000 UNIT) CAPS capsule Take 1 capsule (50,000 Units total) by mouth every 7 (seven) days. 12 capsule 0   metoprolol succinate (TOPROL-XL) 50 MG 24 hr tablet Take 1 tablet (50 mg total) by mouth daily. For blood pressure 90 tablet 3   Vitamin D, Ergocalciferol, (DRISDOL) 1.25 MG (50000 UNIT) CAPS capsule Take 1 capsule (50,000 Units total) by mouth every 7 (seven) days. (Patient not taking: Reported on 08/16/2023) 12 capsule 0   Vitamin D, Ergocalciferol, (DRISDOL) 1.25 MG (50000 UNIT) CAPS capsule Take 1 capsule (50,000 Units total) by mouth 2 (two) times a week. (Patient not taking: Reported on 08/16/2023) 24 capsule 0   No current facility-administered medications on file prior to visit.    BP 126/72   Pulse 64   Temp (!) 97.3 F (36.3 C) (Temporal)   Ht 5\' 6"  (1.676 m)   Wt 205 lb (93 kg)   SpO2 98%   BMI 33.09 kg/m  Objective:   Physical Exam Cardiovascular:     Rate and Rhythm: Normal rate and regular rhythm.  Pulmonary:     Effort: Pulmonary effort is normal.     Breath sounds: Normal breath sounds.  Musculoskeletal:     Cervical back: Neck supple.  Skin:    General: Skin is warm and dry.           Assessment & Plan:  Type 2 diabetes mellitus with hyperglycemia, with long-term current use of insulin (HCC) Assessment & Plan: Improved and controlled with A1C in August of 6.5!  Continue Tresiba 50 units daily. Continue Ozempic 2 mg weekly.  Prescription for freestyle libre 3 sensors provided.  Foot exam today.  Follow up in 6 months.   Orders: -     FreeStyle Libre 3 Sensor; Place 1 sensor on the skin every 14 days. Use to check glucose continuously  Dispense: 6 each; Refill: 1 -     Tresiba FlexTouch; Inject 50 Units into the skin daily. for diabetes.        Doreene Nest, NP

## 2023-09-01 NOTE — Assessment & Plan Note (Addendum)
Improved and controlled with A1C in August of 6.5!  Continue Tresiba 50 units daily. Continue Ozempic 2 mg weekly.  Prescription for freestyle libre 3 sensors provided.  Foot exam today.  Follow up in 6 months.

## 2023-09-15 DIAGNOSIS — M5416 Radiculopathy, lumbar region: Secondary | ICD-10-CM | POA: Diagnosis not present

## 2023-09-15 DIAGNOSIS — Z981 Arthrodesis status: Secondary | ICD-10-CM | POA: Diagnosis not present

## 2023-09-15 DIAGNOSIS — G8929 Other chronic pain: Secondary | ICD-10-CM | POA: Diagnosis not present

## 2023-09-15 DIAGNOSIS — G894 Chronic pain syndrome: Secondary | ICD-10-CM | POA: Diagnosis not present

## 2023-09-15 DIAGNOSIS — Z9689 Presence of other specified functional implants: Secondary | ICD-10-CM | POA: Diagnosis not present

## 2023-09-15 DIAGNOSIS — M25552 Pain in left hip: Secondary | ICD-10-CM | POA: Diagnosis not present

## 2023-09-15 DIAGNOSIS — M25551 Pain in right hip: Secondary | ICD-10-CM | POA: Diagnosis not present

## 2023-09-21 DIAGNOSIS — G8929 Other chronic pain: Secondary | ICD-10-CM | POA: Diagnosis not present

## 2023-09-21 DIAGNOSIS — M1711 Unilateral primary osteoarthritis, right knee: Secondary | ICD-10-CM | POA: Diagnosis not present

## 2023-09-21 DIAGNOSIS — M1611 Unilateral primary osteoarthritis, right hip: Secondary | ICD-10-CM | POA: Diagnosis not present

## 2023-09-21 DIAGNOSIS — M25561 Pain in right knee: Secondary | ICD-10-CM | POA: Diagnosis not present

## 2023-09-21 DIAGNOSIS — M25551 Pain in right hip: Secondary | ICD-10-CM | POA: Diagnosis not present

## 2023-10-10 ENCOUNTER — Other Ambulatory Visit: Payer: Self-pay | Admitting: Primary Care

## 2023-10-10 DIAGNOSIS — E1165 Type 2 diabetes mellitus with hyperglycemia: Secondary | ICD-10-CM

## 2023-10-20 DIAGNOSIS — E119 Type 2 diabetes mellitus without complications: Secondary | ICD-10-CM | POA: Diagnosis not present

## 2023-10-20 DIAGNOSIS — Z794 Long term (current) use of insulin: Secondary | ICD-10-CM | POA: Diagnosis not present

## 2023-10-20 DIAGNOSIS — Z01818 Encounter for other preprocedural examination: Secondary | ICD-10-CM | POA: Diagnosis not present

## 2023-10-20 DIAGNOSIS — G894 Chronic pain syndrome: Secondary | ICD-10-CM | POA: Diagnosis not present

## 2023-10-20 DIAGNOSIS — M328 Other forms of systemic lupus erythematosus: Secondary | ICD-10-CM | POA: Diagnosis not present

## 2023-10-20 DIAGNOSIS — I10 Essential (primary) hypertension: Secondary | ICD-10-CM | POA: Diagnosis not present

## 2023-10-21 NOTE — Telephone Encounter (Signed)
I called the patient to discuss her upcoming surgeries scheduled.  These spinal surgeries will be invasive so I recommended holding Methotrexate starting today. She will need clearance prior to restarting methotrexate after her surgery on 11/12.

## 2023-10-24 DIAGNOSIS — D472 Monoclonal gammopathy: Secondary | ICD-10-CM | POA: Diagnosis not present

## 2023-10-31 ENCOUNTER — Other Ambulatory Visit: Payer: Self-pay | Admitting: Primary Care

## 2023-10-31 DIAGNOSIS — E1165 Type 2 diabetes mellitus with hyperglycemia: Secondary | ICD-10-CM

## 2023-11-03 DIAGNOSIS — E1142 Type 2 diabetes mellitus with diabetic polyneuropathy: Secondary | ICD-10-CM | POA: Diagnosis not present

## 2023-11-03 DIAGNOSIS — R29703 NIHSS score 3: Secondary | ICD-10-CM | POA: Diagnosis not present

## 2023-11-03 DIAGNOSIS — I63531 Cerebral infarction due to unspecified occlusion or stenosis of right posterior cerebral artery: Secondary | ICD-10-CM | POA: Diagnosis not present

## 2023-11-03 DIAGNOSIS — I471 Supraventricular tachycardia, unspecified: Secondary | ICD-10-CM | POA: Diagnosis not present

## 2023-11-03 DIAGNOSIS — H547 Unspecified visual loss: Secondary | ICD-10-CM | POA: Diagnosis not present

## 2023-11-03 DIAGNOSIS — I351 Nonrheumatic aortic (valve) insufficiency: Secondary | ICD-10-CM | POA: Diagnosis not present

## 2023-11-03 DIAGNOSIS — M4150 Other secondary scoliosis, site unspecified: Secondary | ICD-10-CM | POA: Diagnosis not present

## 2023-11-03 DIAGNOSIS — M5440 Lumbago with sciatica, unspecified side: Secondary | ICD-10-CM | POA: Diagnosis not present

## 2023-11-03 DIAGNOSIS — M4157 Other secondary scoliosis, lumbosacral region: Secondary | ICD-10-CM | POA: Diagnosis not present

## 2023-11-03 DIAGNOSIS — M3500 Sicca syndrome, unspecified: Secondary | ICD-10-CM | POA: Diagnosis not present

## 2023-11-03 DIAGNOSIS — I69354 Hemiplegia and hemiparesis following cerebral infarction affecting left non-dominant side: Secondary | ICD-10-CM | POA: Diagnosis not present

## 2023-11-03 DIAGNOSIS — M5116 Intervertebral disc disorders with radiculopathy, lumbar region: Secondary | ICD-10-CM | POA: Diagnosis not present

## 2023-11-03 DIAGNOSIS — M51369 Other intervertebral disc degeneration, lumbar region without mention of lumbar back pain or lower extremity pain: Secondary | ICD-10-CM | POA: Diagnosis not present

## 2023-11-03 DIAGNOSIS — M51379 Other intervertebral disc degeneration, lumbosacral region without mention of lumbar back pain or lower extremity pain: Secondary | ICD-10-CM | POA: Diagnosis not present

## 2023-11-03 DIAGNOSIS — Z888 Allergy status to other drugs, medicaments and biological substances status: Secondary | ICD-10-CM | POA: Diagnosis not present

## 2023-11-03 DIAGNOSIS — I6501 Occlusion and stenosis of right vertebral artery: Secondary | ICD-10-CM | POA: Diagnosis not present

## 2023-11-03 DIAGNOSIS — I639 Cerebral infarction, unspecified: Secondary | ICD-10-CM | POA: Diagnosis not present

## 2023-11-03 DIAGNOSIS — I6389 Other cerebral infarction: Secondary | ICD-10-CM | POA: Diagnosis not present

## 2023-11-03 DIAGNOSIS — E559 Vitamin D deficiency, unspecified: Secondary | ICD-10-CM | POA: Diagnosis not present

## 2023-11-03 DIAGNOSIS — M4726 Other spondylosis with radiculopathy, lumbar region: Secondary | ICD-10-CM | POA: Diagnosis not present

## 2023-11-03 DIAGNOSIS — K5900 Constipation, unspecified: Secondary | ICD-10-CM | POA: Diagnosis not present

## 2023-11-03 DIAGNOSIS — I129 Hypertensive chronic kidney disease with stage 1 through stage 4 chronic kidney disease, or unspecified chronic kidney disease: Secondary | ICD-10-CM | POA: Diagnosis not present

## 2023-11-03 DIAGNOSIS — E1165 Type 2 diabetes mellitus with hyperglycemia: Secondary | ICD-10-CM | POA: Diagnosis not present

## 2023-11-03 DIAGNOSIS — I517 Cardiomegaly: Secondary | ICD-10-CM | POA: Diagnosis not present

## 2023-11-03 DIAGNOSIS — N182 Chronic kidney disease, stage 2 (mild): Secondary | ICD-10-CM | POA: Diagnosis not present

## 2023-11-03 DIAGNOSIS — H543 Unqualified visual loss, both eyes: Secondary | ICD-10-CM | POA: Diagnosis not present

## 2023-11-03 DIAGNOSIS — G894 Chronic pain syndrome: Secondary | ICD-10-CM | POA: Diagnosis not present

## 2023-11-03 DIAGNOSIS — Z01818 Encounter for other preprocedural examination: Secondary | ICD-10-CM | POA: Diagnosis not present

## 2023-11-03 DIAGNOSIS — E1122 Type 2 diabetes mellitus with diabetic chronic kidney disease: Secondary | ICD-10-CM | POA: Diagnosis not present

## 2023-11-03 DIAGNOSIS — M51362 Other intervertebral disc degeneration, lumbar region with discogenic back pain and lower extremity pain: Secondary | ICD-10-CM | POA: Diagnosis not present

## 2023-11-03 DIAGNOSIS — G8929 Other chronic pain: Secondary | ICD-10-CM | POA: Diagnosis not present

## 2023-11-03 DIAGNOSIS — D472 Monoclonal gammopathy: Secondary | ICD-10-CM | POA: Diagnosis not present

## 2023-11-03 DIAGNOSIS — Z981 Arthrodesis status: Secondary | ICD-10-CM | POA: Diagnosis not present

## 2023-11-03 DIAGNOSIS — G8918 Other acute postprocedural pain: Secondary | ICD-10-CM | POA: Diagnosis not present

## 2023-11-03 DIAGNOSIS — M329 Systemic lupus erythematosus, unspecified: Secondary | ICD-10-CM | POA: Diagnosis not present

## 2023-11-03 DIAGNOSIS — M069 Rheumatoid arthritis, unspecified: Secondary | ICD-10-CM | POA: Diagnosis not present

## 2023-11-03 DIAGNOSIS — Z794 Long term (current) use of insulin: Secondary | ICD-10-CM | POA: Diagnosis not present

## 2023-11-03 DIAGNOSIS — Z8673 Personal history of transient ischemic attack (TIA), and cerebral infarction without residual deficits: Secondary | ICD-10-CM | POA: Diagnosis not present

## 2023-11-03 DIAGNOSIS — Z91048 Other nonmedicinal substance allergy status: Secondary | ICD-10-CM | POA: Diagnosis not present

## 2023-11-03 DIAGNOSIS — M4156 Other secondary scoliosis, lumbar region: Secondary | ICD-10-CM | POA: Diagnosis not present

## 2023-11-03 DIAGNOSIS — Z7902 Long term (current) use of antithrombotics/antiplatelets: Secondary | ICD-10-CM | POA: Diagnosis not present

## 2023-11-03 DIAGNOSIS — M4316 Spondylolisthesis, lumbar region: Secondary | ICD-10-CM | POA: Diagnosis not present

## 2023-11-03 DIAGNOSIS — Z881 Allergy status to other antibiotic agents status: Secondary | ICD-10-CM | POA: Diagnosis not present

## 2023-11-03 HISTORY — PX: OTHER SURGICAL HISTORY: SHX169

## 2023-11-14 ENCOUNTER — Telehealth: Payer: Self-pay

## 2023-11-14 NOTE — Transitions of Care (Post Inpatient/ED Visit) (Signed)
   11/14/2023  Name: Lauren Winters MRN: [id number] DOB: Jul 24, 1966  Today's TOC FU Call Status: Today's TOC FU Call Status:: Unsuccessful Call (1st Attempt) Unsuccessful Call (1st Attempt) Date: 11/14/23  Attempted to reach the patient regarding the most recent Inpatient/ED visit.  Follow Up Plan: Additional outreach attempts will be made to reach the patient to complete the Transitions of Care (Post Inpatient/ED visit) call.   Deidre Ala, RN Medical illustrator [facility] [phone]

## 2023-11-15 ENCOUNTER — Telehealth: Payer: Self-pay

## 2023-11-15 NOTE — Transitions of Care (Post Inpatient/ED Visit) (Signed)
   11/15/2023  Name: TYNIKA STANGLER MRN: [id number] DOB: [birth year]  Today's TOC FU Call Status: Today's TOC FU Call Status:: Unsuccessful Call (2nd Attempt) Unsuccessful Call (2nd Attempt) Date: 11/15/23  Attempted to reach the patient regarding the most recent Inpatient/ED visit.  Follow Up Plan: Additional outreach attempts will be made to reach the patient to complete the Transitions of Care (Post Inpatient/ED visit) call.   Deidre Ala, RN Medical illustrator [facility] [phone]

## 2023-11-16 ENCOUNTER — Telehealth: Payer: Self-pay

## 2023-11-16 DIAGNOSIS — I63532 Cerebral infarction due to unspecified occlusion or stenosis of left posterior cerebral artery: Secondary | ICD-10-CM | POA: Diagnosis not present

## 2023-11-16 DIAGNOSIS — I63531 Cerebral infarction due to unspecified occlusion or stenosis of right posterior cerebral artery: Secondary | ICD-10-CM | POA: Diagnosis not present

## 2023-11-16 DIAGNOSIS — R278 Other lack of coordination: Secondary | ICD-10-CM | POA: Diagnosis not present

## 2023-11-16 DIAGNOSIS — M51369 Other intervertebral disc degeneration, lumbar region without mention of lumbar back pain or lower extremity pain: Secondary | ICD-10-CM | POA: Diagnosis not present

## 2023-11-16 NOTE — Transitions of Care (Post Inpatient/ED Visit) (Signed)
   11/16/2023  Name: Lauren Winters MRN: [id number] DOB: May 07, 1966  Today's TOC FU Call Status: Today's TOC FU Call Status:: Unsuccessful Call (3rd Attempt) Unsuccessful Call (3rd Attempt) Date: 11/16/23  Attempted to reach the patient regarding the most recent Inpatient/ED visit.  Follow Up Plan: No further outreach attempts will be made at this time. We have been unable to contact the patient.  Three unsuccessful attempts have been made.   Deidre Ala, RN Medical illustrator [facility] [phone]

## 2023-11-17 DIAGNOSIS — H5461 Unqualified visual loss, right eye, normal vision left eye: Secondary | ICD-10-CM | POA: Diagnosis not present

## 2023-11-17 DIAGNOSIS — E782 Mixed hyperlipidemia: Secondary | ICD-10-CM | POA: Diagnosis not present

## 2023-11-17 DIAGNOSIS — I10 Essential (primary) hypertension: Secondary | ICD-10-CM | POA: Diagnosis not present

## 2023-11-17 DIAGNOSIS — M329 Systemic lupus erythematosus, unspecified: Secondary | ICD-10-CM | POA: Diagnosis not present

## 2023-11-17 DIAGNOSIS — Z794 Long term (current) use of insulin: Secondary | ICD-10-CM | POA: Diagnosis not present

## 2023-11-17 DIAGNOSIS — Z8673 Personal history of transient ischemic attack (TIA), and cerebral infarction without residual deficits: Secondary | ICD-10-CM | POA: Diagnosis not present

## 2023-11-17 DIAGNOSIS — Z981 Arthrodesis status: Secondary | ICD-10-CM | POA: Diagnosis not present

## 2023-11-17 DIAGNOSIS — E119 Type 2 diabetes mellitus without complications: Secondary | ICD-10-CM | POA: Diagnosis not present

## 2023-11-18 DIAGNOSIS — H542X11 Low vision right eye category 1, low vision left eye category 1: Secondary | ICD-10-CM | POA: Diagnosis not present

## 2023-11-18 DIAGNOSIS — E782 Mixed hyperlipidemia: Secondary | ICD-10-CM | POA: Diagnosis not present

## 2023-11-18 DIAGNOSIS — E119 Type 2 diabetes mellitus without complications: Secondary | ICD-10-CM | POA: Diagnosis not present

## 2023-11-18 DIAGNOSIS — Z794 Long term (current) use of insulin: Secondary | ICD-10-CM | POA: Diagnosis not present

## 2023-11-18 DIAGNOSIS — I10 Essential (primary) hypertension: Secondary | ICD-10-CM | POA: Diagnosis not present

## 2023-11-18 DIAGNOSIS — H53462 Homonymous bilateral field defects, left side: Secondary | ICD-10-CM | POA: Diagnosis not present

## 2023-11-18 DIAGNOSIS — I63531 Cerebral infarction due to unspecified occlusion or stenosis of right posterior cerebral artery: Secondary | ICD-10-CM | POA: Diagnosis not present

## 2023-11-29 DIAGNOSIS — M6281 Muscle weakness (generalized): Secondary | ICD-10-CM | POA: Diagnosis not present

## 2023-11-29 DIAGNOSIS — Z7409 Other reduced mobility: Secondary | ICD-10-CM | POA: Diagnosis not present

## 2023-11-29 DIAGNOSIS — I639 Cerebral infarction, unspecified: Secondary | ICD-10-CM | POA: Diagnosis not present

## 2023-11-29 DIAGNOSIS — I63531 Cerebral infarction due to unspecified occlusion or stenosis of right posterior cerebral artery: Secondary | ICD-10-CM | POA: Diagnosis not present

## 2023-11-29 DIAGNOSIS — Z4789 Encounter for other orthopedic aftercare: Secondary | ICD-10-CM | POA: Diagnosis not present

## 2023-11-29 DIAGNOSIS — M51369 Other intervertebral disc degeneration, lumbar region without mention of lumbar back pain or lower extremity pain: Secondary | ICD-10-CM | POA: Diagnosis not present

## 2023-12-01 DIAGNOSIS — Z981 Arthrodesis status: Secondary | ICD-10-CM | POA: Diagnosis not present

## 2023-12-01 DIAGNOSIS — Z4789 Encounter for other orthopedic aftercare: Secondary | ICD-10-CM | POA: Diagnosis not present

## 2023-12-08 DIAGNOSIS — Z7409 Other reduced mobility: Secondary | ICD-10-CM | POA: Diagnosis not present

## 2023-12-08 DIAGNOSIS — H542X11 Low vision right eye category 1, low vision left eye category 1: Secondary | ICD-10-CM | POA: Diagnosis not present

## 2023-12-08 DIAGNOSIS — H53453 Other localized visual field defect, bilateral: Secondary | ICD-10-CM | POA: Diagnosis not present

## 2023-12-08 DIAGNOSIS — I63531 Cerebral infarction due to unspecified occlusion or stenosis of right posterior cerebral artery: Secondary | ICD-10-CM | POA: Diagnosis not present

## 2023-12-08 DIAGNOSIS — M51369 Other intervertebral disc degeneration, lumbar region without mention of lumbar back pain or lower extremity pain: Secondary | ICD-10-CM | POA: Diagnosis not present

## 2023-12-08 DIAGNOSIS — I639 Cerebral infarction, unspecified: Secondary | ICD-10-CM | POA: Diagnosis not present

## 2023-12-08 DIAGNOSIS — H53462 Homonymous bilateral field defects, left side: Secondary | ICD-10-CM | POA: Diagnosis not present

## 2023-12-08 DIAGNOSIS — M6281 Muscle weakness (generalized): Secondary | ICD-10-CM | POA: Diagnosis not present

## 2023-12-08 DIAGNOSIS — Z4789 Encounter for other orthopedic aftercare: Secondary | ICD-10-CM | POA: Diagnosis not present

## 2023-12-11 ENCOUNTER — Other Ambulatory Visit: Payer: Self-pay | Admitting: Physician Assistant

## 2023-12-12 DIAGNOSIS — R278 Other lack of coordination: Secondary | ICD-10-CM | POA: Diagnosis not present

## 2023-12-12 DIAGNOSIS — I639 Cerebral infarction, unspecified: Secondary | ICD-10-CM | POA: Diagnosis not present

## 2023-12-12 NOTE — Telephone Encounter (Signed)
Last Fill: 05/05/2023  Labs: 11/06/2023 WBC 12.6, hemoglobin 11.1`, Hematocrit 32.9, BMP Urea Nitrogen, BUN 4, Glucose 167, Calcium 8.1  Next Visit: 01/16/2024   Last Visit: 09/16/2023  DX: Other organ or system involvement in systemic lupus erythematosus   Current Dose per office note 08/16/2023: methotrexate 7 tablets by mouth once weekly  Okay to refill Methotrexate?

## 2023-12-16 DIAGNOSIS — I63531 Cerebral infarction due to unspecified occlusion or stenosis of right posterior cerebral artery: Secondary | ICD-10-CM | POA: Diagnosis not present

## 2023-12-16 DIAGNOSIS — I619 Nontraumatic intracerebral hemorrhage, unspecified: Secondary | ICD-10-CM | POA: Diagnosis not present

## 2023-12-23 DIAGNOSIS — E1122 Type 2 diabetes mellitus with diabetic chronic kidney disease: Secondary | ICD-10-CM | POA: Diagnosis not present

## 2023-12-23 DIAGNOSIS — H5712 Ocular pain, left eye: Secondary | ICD-10-CM | POA: Diagnosis not present

## 2023-12-23 DIAGNOSIS — Z79899 Other long term (current) drug therapy: Secondary | ICD-10-CM | POA: Diagnosis not present

## 2023-12-23 DIAGNOSIS — Z794 Long term (current) use of insulin: Secondary | ICD-10-CM | POA: Diagnosis not present

## 2023-12-23 DIAGNOSIS — I6523 Occlusion and stenosis of bilateral carotid arteries: Secondary | ICD-10-CM | POA: Diagnosis not present

## 2023-12-23 DIAGNOSIS — I129 Hypertensive chronic kidney disease with stage 1 through stage 4 chronic kidney disease, or unspecified chronic kidney disease: Secondary | ICD-10-CM | POA: Diagnosis not present

## 2023-12-23 DIAGNOSIS — R000 Tachycardia, unspecified: Secondary | ICD-10-CM | POA: Diagnosis not present

## 2023-12-23 DIAGNOSIS — I663 Occlusion and stenosis of cerebellar arteries: Secondary | ICD-10-CM | POA: Diagnosis not present

## 2023-12-23 DIAGNOSIS — Z87891 Personal history of nicotine dependence: Secondary | ICD-10-CM | POA: Diagnosis not present

## 2023-12-23 DIAGNOSIS — E1142 Type 2 diabetes mellitus with diabetic polyneuropathy: Secondary | ICD-10-CM | POA: Diagnosis not present

## 2023-12-23 DIAGNOSIS — R1110 Vomiting, unspecified: Secondary | ICD-10-CM | POA: Diagnosis not present

## 2023-12-23 DIAGNOSIS — M797 Fibromyalgia: Secondary | ICD-10-CM | POA: Diagnosis not present

## 2023-12-23 DIAGNOSIS — R519 Headache, unspecified: Secondary | ICD-10-CM | POA: Diagnosis not present

## 2023-12-23 DIAGNOSIS — E785 Hyperlipidemia, unspecified: Secondary | ICD-10-CM | POA: Diagnosis not present

## 2023-12-23 DIAGNOSIS — N189 Chronic kidney disease, unspecified: Secondary | ICD-10-CM | POA: Diagnosis not present

## 2023-12-23 DIAGNOSIS — I6501 Occlusion and stenosis of right vertebral artery: Secondary | ICD-10-CM | POA: Diagnosis not present

## 2023-12-23 DIAGNOSIS — Z7902 Long term (current) use of antithrombotics/antiplatelets: Secondary | ICD-10-CM | POA: Diagnosis not present

## 2023-12-23 DIAGNOSIS — Z8673 Personal history of transient ischemic attack (TIA), and cerebral infarction without residual deficits: Secondary | ICD-10-CM | POA: Diagnosis not present

## 2023-12-26 DIAGNOSIS — I639 Cerebral infarction, unspecified: Secondary | ICD-10-CM | POA: Diagnosis not present

## 2023-12-26 DIAGNOSIS — R278 Other lack of coordination: Secondary | ICD-10-CM | POA: Diagnosis not present

## 2023-12-26 DIAGNOSIS — Z4789 Encounter for other orthopedic aftercare: Secondary | ICD-10-CM | POA: Diagnosis not present

## 2023-12-26 DIAGNOSIS — M329 Systemic lupus erythematosus, unspecified: Secondary | ICD-10-CM | POA: Diagnosis not present

## 2023-12-26 DIAGNOSIS — Z981 Arthrodesis status: Secondary | ICD-10-CM | POA: Diagnosis not present

## 2024-01-02 DIAGNOSIS — I63532 Cerebral infarction due to unspecified occlusion or stenosis of left posterior cerebral artery: Secondary | ICD-10-CM | POA: Diagnosis not present

## 2024-01-02 DIAGNOSIS — R278 Other lack of coordination: Secondary | ICD-10-CM | POA: Diagnosis not present

## 2024-01-02 DIAGNOSIS — I639 Cerebral infarction, unspecified: Secondary | ICD-10-CM | POA: Diagnosis not present

## 2024-01-02 NOTE — Progress Notes (Unsigned)
Office Visit Note  Patient: Lauren Winters             Date of Birth: [birth year]           MRN: [id number]             PCP: Doreene Nest, NP Referring: Doreene Nest, NP Visit Date: 01/16/2024 Occupation: @GUAROCC @  Subjective:  Several new concerns   History of Present Illness: Lauren Winters is a 58 y.o. female with history of sjogren's syndrome, SLE, osteoarthritis, and fibromyalgia.  Patient is prescribed Plaquenil 200 mg 1 tablet by mouth twice daily, methotrexate 7 tablets by mouth once weekly, folic acid 2mg  po qd. 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  The patient was accompanied today by her close friend Vernona Rieger who also helped provide history since the patient's last office visit.  Patient has been under the care of ophthalmology since she has sustained visual field loss in both eyes since the stroke.  Patient continues to have chronic sicca symptoms especially symptoms of dry mouth.  She has had intermittent oral ulcers recently.  She has been using a humidifier at home.  She denies any recent rashes or symptoms of Raynaud's phenomenon.  She has been working with physical therapy and Occupational Therapy as advised.  She denies any increased joint swelling.  Patient reports that around the time of her surgery she was off of methotrexate for about 10 weeks and was off of Plaquenil for almost 2 weeks.  Patient denies any signs or symptoms of a lupus flare while off of therapy. She denies any recent or recurrent infections.      Activities of Daily Living:  Patient reports morning stiffness for 4-5 hours.   Patient Reports nocturnal pain.  Difficulty dressing/grooming: Reports Difficulty climbing stairs: Reports Difficulty getting out of chair: Reports Difficulty using hands for taps, buttons, cutlery, and/or writing: Reports  Review of Systems   Constitutional:  Positive for fatigue.  HENT:  Positive for mouth sores, mouth dryness and nose dryness.   Eyes:  Positive for visual disturbance and dryness.  Respiratory:  Negative for cough and shortness of breath.   Cardiovascular:  Negative for chest pain and palpitations.  Gastrointestinal:  Negative for blood in stool, constipation and diarrhea.  Endocrine: Negative for increased urination.  Genitourinary:  Negative for involuntary urination.  Musculoskeletal:  Positive for joint pain, joint pain and morning stiffness. Negative for gait problem, joint swelling, myalgias, muscle weakness, muscle tenderness and myalgias.  Skin:  Negative for color change, rash, hair loss and sensitivity to sunlight.  Allergic/Immunologic: Negative for susceptible to infections.  Neurological:  Positive for headaches. Negative for dizziness.  Hematological:  Negative for swollen glands.  Psychiatric/Behavioral:  Positive for depressed mood. Negative for sleep disturbance. The patient is nervous/anxious.     PMFS History:  Patient Active Problem List   Diagnosis Date Noted   Post laminectomy syndrome 05/19/2022   Type 2 diabetes mellitus with hyperglycemia, with long-term current use of insulin (HCC) 12/08/2021   Encounter for pain management counseling 07/25/2021   History of infection of neurostimulator battery w/ removal of device 06/12/2021    Class: History of   DDD (degenerative disc disease), thoracic 06/12/2021   DDD (degenerative disc disease), cervical 06/12/2021   Osteoarthritis of hip (Right) 06/12/2021   Other idiopathic scoliosis, thoracolumbar region 06/12/2021   Fixation hardware in spine (L3-4 and L4-5)  06/12/2021   Chronic pain syndrome 06/10/2021   Pharmacologic therapy 06/10/2021   Disorder of skeletal system 06/10/2021   Problems influencing health status 06/10/2021   Vitamin D deficiency 06/10/2021   Chronic anticoagulation (Plavix) 06/10/2021   Chronic lower extremity  pain (3ry area of Pain) (Right) 06/10/2021   Diabetic peripheral neuropathy (4th area of Pain) (HCC) 06/10/2021   Chronic, intermittent, rib pain (8th area of Pain) (Bilateral) 06/10/2021   Thoracic radiculitis (T6) (intermittent) (Bilateral) 06/10/2021   Chronic neck pain (6th area of Pain) (posterior) (Midline) (R>L) 06/10/2021   Cervicalgia 06/10/2021   Chronic upper extremity pain (7th area of Pain) (Right) 06/10/2021   Monoclonal gammopathy 05/14/2020   Bilateral primary osteoarthritis of hip 10/19/2019   Paresthesias 03/02/2019   Chronic hand pain (Bilateral) 12/06/2018   Left arm weakness 10/11/2018   Numbness and tingling in left arm 10/11/2018   Essential hypertension 10/06/2018   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HCC) 10/06/2018   Stage 3 chronic kidney disease (HCC) 10/06/2018   History of stroke 08/19/2018   History of lumbar spinal fusion (L3-4, L4-5) 05/26/2018   High risk medication use 02/03/2018   Trochanteric bursitis 06/09/2017   Lumbar post-laminectomy syndrome (L3-4, L4-5) 06/09/2017   Rheumatoid arthritis involving multiple sites with positive rheumatoid factor (HCC) 06/09/2017   HNP (herniated nucleus pulposus), lumbar 03/29/2017   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   Chronic shoulder pain (Right) 02/18/2017   Herniation of nucleus pulposus of cervical intervertebral disc without myelopathy 02/09/2017   Lumbosacral spondylosis with radiculopathy 01/21/2017   Primary osteoarthritis of both hands 12/22/2016   Primary osteoarthritis of both knees 12/22/2016   Spondylosis of lumbar region without myelopathy or radiculopathy 12/22/2016   Fibromyalgia 12/22/2016   Encounter for annual general medical examination with abnormal findings in adult 04/19/2016   Chronic hip pain (2ry area of Pain) (Bilateral) 03/09/2016   Disorder of sacroiliac joint 02/23/2016   Chronic low back pain (1ry area of Pain) (Bilateral) (R>L) w/o sciatica 02/04/2016    Chronic mid back pain (5th area of Pain) (Bilateral) 12/17/2015   Displacement of lumbar intervertebral disc without myelopathy 10/29/2015   Hyperlipidemia 08/29/2015   Lupus 08/04/2015   Sjogren's syndrome (HCC) 08/04/2015   Mitral regurgitation    Atypical chest pain    Obesity (BMI 30-39.9)    GAD (generalized anxiety disorder)    MDD (major depressive disorder) 05/23/2014   Chest pain 05/23/2014    Past Medical History:  Diagnosis Date   Anxiety    Atypical chest pain    a. ETT 05/2014: normal stress test without evidence of myocardial ischemia or chest pain at peak stress; b. 02/2021 Cor CTA: Ca2+ score = 0. Nl cors.   Complication of anesthesia    migraine headache   Depression    Diabetes mellitus without complication (HCC) metformin   dx 2015   Dyspnea    on exertion   Elevated C-reactive protein (CRP) 06/12/2021   Elevated d-dimer 06/10/2021   Elevated rheumatoid factor 06/10/2021   Fibromyalgia    Headache    Heart murmur    a. told back in 2007, but no mention with 2016 cardiac w/u; b. 02/2019 Echo: EF 60-65%, conc LVH. Nl RV fxn. Mild Ca2+ of MV. Triv AI.   Hyperlipidemia    Hypertension    Local infection of wound 02/07/2018   Lumbar radiculopathy    Lupus (systemic lupus erythematosus) (HCC)    Mitral regurgitation    a. 05/2014 Echo: Mild MR/AI; b.  05/2019 Echo: Mild Ca2+ of MV.   Obesity (BMI 30-39.9)    Palpitations    a. 02/2021 Zio: Predominantly SR @ 75 (44-167). 8 SVT runs (max 167 bpm, longest 10 beats). ? Atrial tach. Triggered events assoc w/ SVT. Otw rare isolated PACs/PVCs. No afib/flutter.   Positive ANA (antinuclear antibody) 06/10/2021   Raynaud's phenomenon    Rheumatoid arthritis (HCC)    Rheumatoid factor positive 08/04/2015   82   S/P lumbar laminectomy 12/09/2017   S/P lumbar spinal fusion 05/26/2018   Sjogren's disease (HCC)    Spinal stenosis    Spondylolisthesis of lumbar region    Stroke [facility])    Tobacco abuse    a. ongoing; b.  ongoing SOB   Wound infection after surgery 01/11/2018    Family History  Problem Relation Age of Onset   CAD Father 31       CABG x 4   Liver cancer Father 52       passed   Valvular heart disease Father    Hypertension Father    CAD Paternal Aunt        CABG   Breast cancer Paternal Aunt    CAD Paternal Aunt        CABG   Breast cancer Paternal Aunt    CAD Paternal Aunt        CABG   Breast cancer Paternal Aunt    CAD Paternal Aunt        CABG   Breast cancer Paternal Aunt    CAD Paternal Aunt        CABG   Breast cancer Paternal Aunt    CAD Paternal Uncle        MI s/p CABG   Stroke Maternal Grandmother    Stroke Maternal Grandfather    CAD Maternal Grandfather    Heart Problems Brother    Past Surgical History:  Procedure Laterality Date   ABDOMINAL HYSTERECTOMY     BACK SURGERY     BONE MARROW BIOPSY  10/2019   CESAREAN SECTION     x3   LUMBAR LAMINECTOMY     LUMBAR LAMINECTOMY/DECOMPRESSION MICRODISCECTOMY N/A 03/29/2017   Procedure: Re operative Right Lumbar Three-Four Laminectomy and discectomy with Left Lumbar Three-Four, Bilateral Lumbar Four-Five Laminectomy for decompression;  Surgeon: Loura Halt Ditty, MD;  Location: [facility] OR;  Service: Neurosurgery;  Laterality: N/A;   spinal cord fusion   11/03/2023   SPINAL CORD STIMULATOR IMPLANT  11/30/2022   SPINAL CORD STIMULATOR INSERTION  12/09/2017   Procedure: LUMBAR SPINAL CORD STIMULATOR IMPLANT;  Surgeon: Tia Alert, MD;  Location: [facility] OR;  Service: Neurosurgery;;   SPINAL CORD STIMULATOR INSERTION     06/18/2022 leads placed, 06/25/2022 battery placed   SPINAL CORD STIMULATOR INSERTION  06/25/2022   SPINAL CORD STIMULATOR REMOVAL N/A 01/11/2018   Procedure: LUMBAR SPINAL CORD STIMULATOR REMOVAL;  Surgeon: Tia Alert, MD;  Location: [facility] OR;  Service: Neurosurgery;  Laterality: N/A;   TUBAL LIGATION     Social History   Social History Narrative   Married.   3 children.   Work Presenter, broadcasting at  [employer].   Enjoys sleeping, relaxing.    Immunization History  Administered Date(s) Administered   Influenza, Seasonal, Injecte, Preservative Fre 09/01/2023   Influenza,inj,Quad PF,6+ Mos 10/19/2016, 10/21/2017, 01/31/2019, 10/19/2019, 12/08/2021, 11/08/2022   Moderna Covid-19 Fall Seasonal Vaccine 81yrs & older 11/08/2022   PFIZER(Purple Top)SARS-COV-2 Vaccination 04/17/2020, 05/08/2020   Pneumococcal Polysaccharide-23 10/27/2014, 06/25/2019   Td  04/19/2016   Zoster Recombinant(Shingrix) 03/26/2022, 02/17/2023   Zoster, Live 10/27/2014     Objective: Vital Signs: BP 137/82 (BP Location: Left Arm, Patient Position: Sitting, Cuff Size: Normal)   Pulse 83   Resp 15   Ht 5\' 6"  (1.676 m)   Wt 202 lb 6.4 oz (91.8 kg)   BMI 32.67 kg/m    Physical Exam Vitals and nursing note reviewed.  Constitutional:      Appearance: She is well-developed.  HENT:     Head: Normocephalic and atraumatic.  Eyes:     Conjunctiva/sclera: Conjunctivae normal.  Cardiovascular:     Rate and Rhythm: Normal rate and regular rhythm.     Heart sounds: Normal heart sounds.  Pulmonary:     Effort: Pulmonary effort is normal.     Breath sounds: Normal breath sounds.  Abdominal:     General: Bowel sounds are normal.     Palpations: Abdomen is soft.  Musculoskeletal:     Cervical back: Normal range of motion.  Lymphadenopathy:     Cervical: No cervical adenopathy.  Skin:    General: Skin is warm and dry.     Capillary Refill: Capillary refill takes less than 2 seconds.  Neurological:     Mental Status: She is alert and oriented to person, place, and time.  Psychiatric:        Behavior: Behavior normal.      Musculoskeletal Exam: Patient remained seated during the examination today.  C-spine has limited ROM. No tenderness or synovitis of MCP joints. Knee joints have good ROM with no warmth or effusion.  Ankle joints have good ROM with no tenderness or joint swelling.    CDAI Exam: CDAI  Score: -- Patient Global: --; Provider Global: -- Swollen: --; Tender: -- Joint Exam 01/16/2024   No joint exam has been documented for this visit   There is currently no information documented on the homunculus. Go to the Rheumatology activity and complete the homunculus joint exam.  Investigation: No additional findings.  Imaging: No results found.  Recent Labs: Lab Results  Component Value Date   WBC 9.8 08/16/2023   HGB 14.1 08/16/2023   PLT 446 (H) 08/16/2023   NA 139 08/16/2023   K 4.3 08/16/2023   CL 101 08/16/2023   CO2 31 08/16/2023   GLUCOSE 136 (H) 08/16/2023   BUN 11 08/16/2023   CREATININE 0.95 08/16/2023   BILITOT 0.6 08/16/2023   ALKPHOS 102 06/10/2021   AST 14 08/16/2023   ALT 10 08/16/2023   PROT 7.7 08/16/2023   ALBUMIN 4.4 06/10/2021   CALCIUM 9.8 08/16/2023   GFRAA 103 10/20/2020    Speciality Comments: PLQ Eye Exam: 11/17/2022 @ [facility] Follow up in 1 year  Procedures:  No procedures performed Allergies: Pseudoephedrine hcl, Ancef [cefazolin], Clindamycin/lincomycin, Prednisone, Triprolidine hcl, Adhesive [tape], Other, and Sudafed [pseudoephedrine hcl]    Assessment / Plan:     Visit Diagnoses: Sjogren's syndrome with other organ involvement [facility]): Chronic sicca symptoms-currently experiencing exacerbation of symptoms of mouth dryness.  Discussed the use of over-the-counter products including Biotene products, XyliMelts, and smart mouth rinse.  Also discussed the use of a humidifier. Discussed the increased risk for malignancy in patients with Sjogren's syndrome specifically lymphoma.  Patient is under the care of hematology for management of MGUS. ANA, Ro antibody, and La antibody's were negative on 12/26/2023. She has no synovitis on examination today.  Patient remains on Plaquenil 200 mg 1 tablet by mouth twice daily and  methotrexate 7 tablets by mouth once weekly.  Patient recently had a 10-week gap in therapy of methotrexate due  to undergoing a lumbar fusion on 11/03/2023.  While hospitalized postoperatively she developed a cerebral infarction due to stenosis of the right posterior cerebral artery.  Patient remains on Plavix as prescribed by PCP.  She is working with OT and PT currently.  Patient has had visual field loss involving both eyes and is currently under the care of ophthalmology at [facility].  According to the patient's friend Vernona Rieger she has had updated OCT testing--we will call to obtain these results as well as clearance to continue Plaquenil given the visual field loss and questionable reliability with testing going forward. Creatinine creatinine was slightly elevated and GFR was borderline low at 58 on 12/23/2023.  Plan on updating CMP with GFR prior to making any other medication changes.  She will remain on methotrexate 7 tablets weekly and folic acid 2 mg daily for now.  If her renal function remains abnormal we will likely need to reduce the dose of methotrexate.  Other organ or system involvement in systemic lupus erythematosus (HCC) - +SSB, +RF,oral ulcers, malar rash, fatigue, photosensitivity, Raynaud's, Subcutaneous lupus-bx+, hair loss, sicca symptoms: Patient is not exhibiting any signs or symptoms of a systemic lupus flare at this time.  Reviewed lab work from 12/26/2023: Anticardiolipin antibodies negative, ANA negative, beta-2 glycoprotein antibodies negative, complements within normal limits, double-stranded ENA negative, antichromatin negative, antiribosomal P-, Ro antibody negative, La antibody negative, anticentromere negative, Smith antibody negative, anti-Smith/RNP negative, anti-RNP negative, SCL 70 negative, anti-Jo1 negative--- labs are not consistent with active disease or a flare of systemic lupus.  No recent rashes or symptoms of Raynaud's phenomenon.  She continues to have chronic sicca symptoms secondary to Sjogren's syndrome.  Over-the-counter products were discussed in detail as discussed above.    She is currently taking Plaquenil 200 mg 1 tablet by mouth twice daily, methotrexate 7 tablets by mouth once weekly, and folic acid 2 mg daily.  Patient underwent a lumbar fusion on 11/03/2023 and was off of methotrexate for a total of 10 weeks and Plaquenil for almost 2 weeks.  While hospitalized during the postoperative period she was diagnosed with a cerebral infarct due to stenosis of the right posterior cerebral artery. The patient sustained visual field loss bilaterally.  Patient was accompanied today by her close friend Vernona Rieger who provided some of the history as well as had several questions and concerns.  Patient will require clearance to continue Plaquenil as prescribed.  She will remain on methotrexate and folic acid as prescribed for now.  CMP with GFR was updated today due to recent elevation of creatinine.  Patient was advised to notify us if she develops signs or symptoms of a flare.   she will follow-up in the office in 3 months or sooner if needed.  Of note the patient has an office visit scheduled at [facility] rheumatology in April 2026.   High risk medication use - Plaquenil 200 mg 1 tablet by mouth twice daily, methotrexate 7 tablets by mouth once weekly, folic acid 2mg  po qd. CBC and BMP updated on 12/23/23-creatinine elevated 1.1 and GFR low-58.   CMP updated today.    PLQ Eye Exam: 11/17/2022 @ [facility] Follow up in 1 year.  According to the patient and her close friend Vernona Rieger she has had updated OCT testing--plan to send a Plaquenil eye examination to determine if the patient is able to continue Plaquenil as prescribed.  No recent or recurrent infections. Discussed the importance of holding methotrexate if she develops signs or symptoms of an infection and to resume once the infection has completely cleared. - Plan: COMPLETE METABOLIC PANEL WITH GFR  Elevated serum creatinine -Creatinine elevated-1.1 and GFR low 58 on 12/23/23. CMP updated today--methotrexate dose changes will be  made pending results.  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 She has sustained visual field loss bilaterally and has been under the care of ophthalmology. Currently working with PT and OT several days per week.  Using a cane to assist with ambulation.  Anticardiolipin antibodies and beta-2 glycoprotein antibodies negative on 12/26/2023. Patient remains on Plavix as prescribed by her PCP.  Patient plans on following up with her hematologist to discuss if she will require anticoagulation long-term.   Raynaud's syndrome without gangrene: Not currently symptomatic.  No digital ulcers or signs of gangrene noted.   Vitamin D deficiency: She is taking a daily vitamin D supplement.  Primary osteoarthritis of both hands: No synovitis noted.  Primary osteoarthritis of both knees: No warmth or effusion noted today.  Trochanteric bursitis of both hips: Chronic pain.  S/P lumbar laminectomy - Under the care of pain management, neurosurgery.  Chronic pain. Previous spinal fusion in October 2024.  Under care of Dr. Tildon Husky.   Underwent lumbar fusion on 11/03/2023-Pain has minimally improved. Using cane to assist with ambulation.   Fibromyalgia: Patient continues to experience intermittent myalgias and muscle tenderness due to fibromyalgia.  Osteoporosis screening - Previous DEXA scan on 05/04/17 was normal done by her PCP.  DEXA updated on 08/18/22: the BMD measured at Femur Neck Left is 0.962 g/cm2 with a T-score of -0.5-reviewed results with the patient and her friend Vernona Rieger today.   Plan to update DEXA in August 2025 for close monitoring purposes.    MGUS (monoclonal gammopathy of unknown significance): Under care of Dr. Delynn Flavin.  She will be scheduling a sooner visit with Dr. Delynn Flavin.    Other medical conditions are listed as follows:   History of CVA (cerebrovascular accident)  Mitral valve insufficiency, unspecified etiology  Diabetes mellitus without complication (HCC)  Essential hypertension: BP was 137/82 today in the office.   Former smoker    Orders: Orders Placed This Encounter  Procedures   COMPLETE METABOLIC PANEL WITH GFR   No orders of the defined types were placed in this encounter.    Follow-Up Instructions: Return in about 3 months (around 04/15/2024) for Sjogren's syndrome, Osteoarthritis, Fibromyalgia.   Gearldine Bienenstock, PA-C  Note - This record has been created using Dragon software.  Chart creation errors have been sought, but may not always  have been located. Such creation errors do not reflect on  the standard of medical care.

## 2024-01-03 DIAGNOSIS — I639 Cerebral infarction, unspecified: Secondary | ICD-10-CM | POA: Diagnosis not present

## 2024-01-03 DIAGNOSIS — M6281 Muscle weakness (generalized): Secondary | ICD-10-CM | POA: Diagnosis not present

## 2024-01-03 DIAGNOSIS — Z4789 Encounter for other orthopedic aftercare: Secondary | ICD-10-CM | POA: Diagnosis not present

## 2024-01-03 DIAGNOSIS — Z7409 Other reduced mobility: Secondary | ICD-10-CM | POA: Diagnosis not present

## 2024-01-03 DIAGNOSIS — M51369 Other intervertebral disc degeneration, lumbar region without mention of lumbar back pain or lower extremity pain: Secondary | ICD-10-CM | POA: Diagnosis not present

## 2024-01-03 DIAGNOSIS — I63531 Cerebral infarction due to unspecified occlusion or stenosis of right posterior cerebral artery: Secondary | ICD-10-CM | POA: Diagnosis not present

## 2024-01-05 DIAGNOSIS — Z7409 Other reduced mobility: Secondary | ICD-10-CM | POA: Diagnosis not present

## 2024-01-05 DIAGNOSIS — Z4789 Encounter for other orthopedic aftercare: Secondary | ICD-10-CM | POA: Diagnosis not present

## 2024-01-05 DIAGNOSIS — M51369 Other intervertebral disc degeneration, lumbar region without mention of lumbar back pain or lower extremity pain: Secondary | ICD-10-CM | POA: Diagnosis not present

## 2024-01-05 DIAGNOSIS — I639 Cerebral infarction, unspecified: Secondary | ICD-10-CM | POA: Diagnosis not present

## 2024-01-05 DIAGNOSIS — M6281 Muscle weakness (generalized): Secondary | ICD-10-CM | POA: Diagnosis not present

## 2024-01-05 NOTE — Telephone Encounter (Signed)
 LVM advised patient to call medical records @ [phone] to obtain.

## 2024-01-09 DIAGNOSIS — I63532 Cerebral infarction due to unspecified occlusion or stenosis of left posterior cerebral artery: Secondary | ICD-10-CM | POA: Diagnosis not present

## 2024-01-09 DIAGNOSIS — I639 Cerebral infarction, unspecified: Secondary | ICD-10-CM | POA: Diagnosis not present

## 2024-01-09 DIAGNOSIS — R278 Other lack of coordination: Secondary | ICD-10-CM | POA: Diagnosis not present

## 2024-01-10 DIAGNOSIS — H53453 Other localized visual field defect, bilateral: Secondary | ICD-10-CM | POA: Diagnosis not present

## 2024-01-10 DIAGNOSIS — H53462 Homonymous bilateral field defects, left side: Secondary | ICD-10-CM | POA: Diagnosis not present

## 2024-01-10 DIAGNOSIS — I63531 Cerebral infarction due to unspecified occlusion or stenosis of right posterior cerebral artery: Secondary | ICD-10-CM | POA: Diagnosis not present

## 2024-01-10 DIAGNOSIS — E1142 Type 2 diabetes mellitus with diabetic polyneuropathy: Secondary | ICD-10-CM | POA: Diagnosis not present

## 2024-01-11 DIAGNOSIS — I63531 Cerebral infarction due to unspecified occlusion or stenosis of right posterior cerebral artery: Secondary | ICD-10-CM | POA: Diagnosis not present

## 2024-01-11 DIAGNOSIS — Z4789 Encounter for other orthopedic aftercare: Secondary | ICD-10-CM | POA: Diagnosis not present

## 2024-01-11 DIAGNOSIS — Z7409 Other reduced mobility: Secondary | ICD-10-CM | POA: Diagnosis not present

## 2024-01-11 DIAGNOSIS — M51369 Other intervertebral disc degeneration, lumbar region without mention of lumbar back pain or lower extremity pain: Secondary | ICD-10-CM | POA: Diagnosis not present

## 2024-01-11 DIAGNOSIS — M6281 Muscle weakness (generalized): Secondary | ICD-10-CM | POA: Diagnosis not present

## 2024-01-11 DIAGNOSIS — I639 Cerebral infarction, unspecified: Secondary | ICD-10-CM | POA: Diagnosis not present

## 2024-01-16 ENCOUNTER — Ambulatory Visit: Payer: Medicare Other | Attending: Physician Assistant | Admitting: Physician Assistant

## 2024-01-16 ENCOUNTER — Encounter: Payer: Self-pay | Admitting: Physician Assistant

## 2024-01-16 VITALS — BP 137/82 | HR 83 | Resp 15 | Ht 66.0 in | Wt 202.4 lb

## 2024-01-16 DIAGNOSIS — Z87891 Personal history of nicotine dependence: Secondary | ICD-10-CM

## 2024-01-16 DIAGNOSIS — M170 Bilateral primary osteoarthritis of knee: Secondary | ICD-10-CM | POA: Diagnosis not present

## 2024-01-16 DIAGNOSIS — I63531 Cerebral infarction due to unspecified occlusion or stenosis of right posterior cerebral artery: Secondary | ICD-10-CM

## 2024-01-16 DIAGNOSIS — M3219 Other organ or system involvement in systemic lupus erythematosus: Secondary | ICD-10-CM | POA: Diagnosis not present

## 2024-01-16 DIAGNOSIS — Z8673 Personal history of transient ischemic attack (TIA), and cerebral infarction without residual deficits: Secondary | ICD-10-CM | POA: Diagnosis not present

## 2024-01-16 DIAGNOSIS — M7061 Trochanteric bursitis, right hip: Secondary | ICD-10-CM | POA: Diagnosis not present

## 2024-01-16 DIAGNOSIS — M19041 Primary osteoarthritis, right hand: Secondary | ICD-10-CM | POA: Diagnosis not present

## 2024-01-16 DIAGNOSIS — D472 Monoclonal gammopathy: Secondary | ICD-10-CM

## 2024-01-16 DIAGNOSIS — Z79899 Other long term (current) drug therapy: Secondary | ICD-10-CM | POA: Diagnosis not present

## 2024-01-16 DIAGNOSIS — I7300 Raynaud's syndrome without gangrene: Secondary | ICD-10-CM

## 2024-01-16 DIAGNOSIS — M3509 Sicca syndrome with other organ involvement: Secondary | ICD-10-CM | POA: Diagnosis not present

## 2024-01-16 DIAGNOSIS — Z13820 Encounter for screening for osteoporosis: Secondary | ICD-10-CM

## 2024-01-16 DIAGNOSIS — I10 Essential (primary) hypertension: Secondary | ICD-10-CM

## 2024-01-16 DIAGNOSIS — Z9889 Other specified postprocedural states: Secondary | ICD-10-CM

## 2024-01-16 DIAGNOSIS — I340 Nonrheumatic mitral (valve) insufficiency: Secondary | ICD-10-CM | POA: Diagnosis not present

## 2024-01-16 DIAGNOSIS — E559 Vitamin D deficiency, unspecified: Secondary | ICD-10-CM | POA: Diagnosis not present

## 2024-01-16 DIAGNOSIS — R7989 Other specified abnormal findings of blood chemistry: Secondary | ICD-10-CM

## 2024-01-16 DIAGNOSIS — E119 Type 2 diabetes mellitus without complications: Secondary | ICD-10-CM

## 2024-01-16 DIAGNOSIS — M797 Fibromyalgia: Secondary | ICD-10-CM | POA: Diagnosis not present

## 2024-01-16 DIAGNOSIS — M19042 Primary osteoarthritis, left hand: Secondary | ICD-10-CM

## 2024-01-16 DIAGNOSIS — M7062 Trochanteric bursitis, left hip: Secondary | ICD-10-CM

## 2024-01-17 ENCOUNTER — Telehealth: Payer: Self-pay

## 2024-01-17 DIAGNOSIS — I639 Cerebral infarction, unspecified: Secondary | ICD-10-CM | POA: Diagnosis not present

## 2024-01-17 DIAGNOSIS — M6281 Muscle weakness (generalized): Secondary | ICD-10-CM | POA: Diagnosis not present

## 2024-01-17 DIAGNOSIS — Z7409 Other reduced mobility: Secondary | ICD-10-CM | POA: Diagnosis not present

## 2024-01-17 DIAGNOSIS — M51369 Other intervertebral disc degeneration, lumbar region without mention of lumbar back pain or lower extremity pain: Secondary | ICD-10-CM | POA: Diagnosis not present

## 2024-01-17 DIAGNOSIS — Z4789 Encounter for other orthopedic aftercare: Secondary | ICD-10-CM | POA: Diagnosis not present

## 2024-01-17 LAB — COMPLETE METABOLIC PANEL WITH GFR
AG Ratio: 1.2 (calc) (ref 1.0–2.5)
ALT: 10 U/L (ref 6–29)
AST: 17 U/L (ref 10–35)
Albumin: 4.2 g/dL (ref 3.6–5.1)
Alkaline phosphatase (APISO): 88 U/L (ref 37–153)
BUN: 18 mg/dL (ref 7–25)
CO2: 30 mmol/L (ref 20–32)
Calcium: 9.7 mg/dL (ref 8.6–10.4)
Chloride: 100 mmol/L (ref 98–110)
Creat: 0.88 mg/dL (ref 0.50–1.03)
Globulin: 3.5 g/dL (ref 1.9–3.7)
Glucose, Bld: 74 mg/dL (ref 65–99)
Potassium: 4.8 mmol/L (ref 3.5–5.3)
Sodium: 137 mmol/L (ref 135–146)
Total Bilirubin: 0.6 mg/dL (ref 0.2–1.2)
Total Protein: 7.7 g/dL (ref 6.1–8.1)
eGFR: 76 mL/min/{1.73_m2} (ref >=60)

## 2024-01-17 NOTE — Telephone Encounter (Signed)
I called Dr. Horatio Pel office (hematology) per patient's request yesterday and scheduled patient an appointment for 02/20/2024 at 3:30pm with a 3:15 arrival time at the [facility] office. I called patient and left message to advise.

## 2024-01-17 NOTE — Telephone Encounter (Signed)
I have faxed plaquenil eye exam form to Dr. Leanora Ivanoff office and requested written clearance for patient to continue plaquenil per Sherron Ales, PA-C. Faxed was completed and placed in PLQ eye exams folder at [employer].

## 2024-01-17 NOTE — Progress Notes (Signed)
CMP WNL-ok to continue current dose of methotrexate

## 2024-01-19 DIAGNOSIS — M6281 Muscle weakness (generalized): Secondary | ICD-10-CM | POA: Diagnosis not present

## 2024-01-19 DIAGNOSIS — I639 Cerebral infarction, unspecified: Secondary | ICD-10-CM | POA: Diagnosis not present

## 2024-01-19 DIAGNOSIS — Z4789 Encounter for other orthopedic aftercare: Secondary | ICD-10-CM | POA: Diagnosis not present

## 2024-01-19 DIAGNOSIS — M51369 Other intervertebral disc degeneration, lumbar region without mention of lumbar back pain or lower extremity pain: Secondary | ICD-10-CM | POA: Diagnosis not present

## 2024-01-19 DIAGNOSIS — Z7409 Other reduced mobility: Secondary | ICD-10-CM | POA: Diagnosis not present

## 2024-01-19 NOTE — Telephone Encounter (Signed)
Per Sherron Ales PA-C: "discussed with Dr. Corliss Skains and reviewed serology ANA negative and ENA negative. Ok to discontinue PLQ and continue methotrexate as monotherapy for now. Patient can follow up with local ophthalmologist"   Attempted to contact patient mobile number and her husbands number and left 2 messages to advise patient to call the office.  (I have left the patient a total of 3 messages today between her phone and her husbands phone).

## 2024-01-19 NOTE — Telephone Encounter (Signed)
Received form back from Dr. Mikael Spray stating "I did not conduct a dedicated plaquenil toxicity exam. She was referred for vision changes after a stroke. For a plaquenil toxicity assessment, she should see her local general ophthalmologist or retinal specialist".   I called the patient and left a message to advise patient to please return my call.

## 2024-01-20 NOTE — Telephone Encounter (Signed)
Patient advised Per Sherron Ales PA-C: "discussed with Dr. Corliss Skains and reviewed serology ANA negative and ENA negative. Ok to discontinue PLQ and continue methotrexate as monotherapy for now. Patient can follow up with local ophthalmologist". Patient verbalized understanding. Hydroxychloroquine discontinued in patient's chart.

## 2024-01-24 DIAGNOSIS — Z4789 Encounter for other orthopedic aftercare: Secondary | ICD-10-CM | POA: Diagnosis not present

## 2024-01-24 DIAGNOSIS — M51369 Other intervertebral disc degeneration, lumbar region without mention of lumbar back pain or lower extremity pain: Secondary | ICD-10-CM | POA: Diagnosis not present

## 2024-01-24 DIAGNOSIS — Z7409 Other reduced mobility: Secondary | ICD-10-CM | POA: Diagnosis not present

## 2024-01-24 DIAGNOSIS — M6281 Muscle weakness (generalized): Secondary | ICD-10-CM | POA: Diagnosis not present

## 2024-01-24 DIAGNOSIS — I639 Cerebral infarction, unspecified: Secondary | ICD-10-CM | POA: Diagnosis not present

## 2024-01-25 DIAGNOSIS — I639 Cerebral infarction, unspecified: Secondary | ICD-10-CM | POA: Diagnosis not present

## 2024-01-25 DIAGNOSIS — R278 Other lack of coordination: Secondary | ICD-10-CM | POA: Diagnosis not present

## 2024-01-25 DIAGNOSIS — I63532 Cerebral infarction due to unspecified occlusion or stenosis of left posterior cerebral artery: Secondary | ICD-10-CM | POA: Diagnosis not present

## 2024-01-26 ENCOUNTER — Other Ambulatory Visit: Payer: Self-pay | Admitting: *Deleted

## 2024-01-26 DIAGNOSIS — M170 Bilateral primary osteoarthritis of knee: Secondary | ICD-10-CM

## 2024-01-26 DIAGNOSIS — M3509 Sicca syndrome with other organ involvement: Secondary | ICD-10-CM

## 2024-01-26 DIAGNOSIS — E559 Vitamin D deficiency, unspecified: Secondary | ICD-10-CM

## 2024-01-26 DIAGNOSIS — R7989 Other specified abnormal findings of blood chemistry: Secondary | ICD-10-CM

## 2024-01-26 DIAGNOSIS — M3219 Other organ or system involvement in systemic lupus erythematosus: Secondary | ICD-10-CM

## 2024-01-26 DIAGNOSIS — Z79899 Other long term (current) drug therapy: Secondary | ICD-10-CM

## 2024-01-26 DIAGNOSIS — M7061 Trochanteric bursitis, right hip: Secondary | ICD-10-CM

## 2024-01-26 DIAGNOSIS — M19041 Primary osteoarthritis, right hand: Secondary | ICD-10-CM

## 2024-01-26 DIAGNOSIS — M797 Fibromyalgia: Secondary | ICD-10-CM

## 2024-01-26 DIAGNOSIS — I7300 Raynaud's syndrome without gangrene: Secondary | ICD-10-CM

## 2024-01-28 ENCOUNTER — Other Ambulatory Visit: Payer: Self-pay | Admitting: Physician Assistant

## 2024-01-30 ENCOUNTER — Telehealth: Payer: Self-pay

## 2024-01-30 DIAGNOSIS — I63532 Cerebral infarction due to unspecified occlusion or stenosis of left posterior cerebral artery: Secondary | ICD-10-CM | POA: Diagnosis not present

## 2024-01-30 DIAGNOSIS — F411 Generalized anxiety disorder: Secondary | ICD-10-CM

## 2024-01-30 DIAGNOSIS — F331 Major depressive disorder, recurrent, moderate: Secondary | ICD-10-CM

## 2024-01-30 DIAGNOSIS — R278 Other lack of coordination: Secondary | ICD-10-CM | POA: Diagnosis not present

## 2024-01-30 NOTE — Telephone Encounter (Signed)
Called and spoke with [facility] Rheumat[facility], advised she reach out to patients local rheumatologist, Sherron Ales at [facility] rheumatology.

## 2024-01-30 NOTE — Telephone Encounter (Signed)
Copied from CRM [id number]. Topic: Clinical - Medication Question >> Jan 30, 2024  4:01 PM Sonny Dandy B wrote: Reason for CRM: [facility], [place], [id number] called  requesting labs, and information regarding pt's Lupus diagnosis, they are  requesting the information to be sent. To [phone] .

## 2024-01-31 ENCOUNTER — Ambulatory Visit (INDEPENDENT_AMBULATORY_CARE_PROVIDER_SITE_OTHER): Payer: Medicare Other | Admitting: *Deleted

## 2024-01-31 DIAGNOSIS — Z0000 Encounter for general adult medical examination without abnormal findings: Secondary | ICD-10-CM

## 2024-01-31 MED ORDER — ESCITALOPRAM OXALATE 20 MG PO TABS: 20.0000 mg | ORAL_TABLET | Freq: Every day | ORAL | 0 refills | Status: DC

## 2024-01-31 NOTE — Patient Instructions (Signed)
 [name] , Thank you for taking time to come for your Medicare Wellness Visit. I appreciate your ongoing commitment to your health goals. Please review the following plan we discussed and let me know if I can assist you in the future.   Screening recommendations/referrals: Mammogram: up to date  Recommended yearly ophthalmology/optometry visit for glaucoma screening and checkup Recommended yearly dental visit for hygiene and checkup  Vaccinations: Influenza vaccine:  Pneumococcal vaccine:  Tdap vaccine:  Shingles vaccine:         Preventive Care 65 Years and Older, Female Preventive care refers to lifestyle choices and visits with your health care provider that can promote health and wellness. What does preventive care include? A yearly physical exam. This is also called an annual well check. Dental exams once or twice a year. Routine eye exams. Ask your health care provider how often you should have your eyes checked. Personal lifestyle choices, including: Daily care of your teeth and gums. Regular physical activity. Eating a healthy diet. Avoiding tobacco and drug use. Limiting alcohol use. Practicing safe sex. Taking low-dose aspirin  every day. Taking vitamin and mineral supplements as recommended by your health care provider. What happens during an annual well check? The services and screenings done by your health care provider during your annual well check will depend on your age, overall health, lifestyle risk factors, and family history of disease. Counseling  Your health care provider may ask you questions about your: Alcohol use. Tobacco use. Drug use. Emotional well-being. Home and relationship well-being. Sexual activity. Eating habits. History of falls. Memory and ability to understand (cognition). Work and work astronomer. Reproductive health. Screening  You may have the following tests or measurements: Height, weight, and BMI. Blood  pressure. Lipid and cholesterol levels. These may be checked every 5 years, or more frequently if you are over 67 years old. Skin check. Lung cancer screening. You may have this screening every year starting at age 1 if you have a 30-pack-year history of smoking and currently smoke or have quit within the past 15 years. Fecal occult blood test (FOBT) of the stool. You may have this test every year starting at age 52. Flexible sigmoidoscopy or colonoscopy. You may have a sigmoidoscopy every 5 years or a colonoscopy every 10 years starting at age 40. Hepatitis C blood test. Hepatitis B blood test. Sexually transmitted disease (STD) testing. Diabetes screening. This is done by checking your blood sugar (glucose) after you have not eaten for a while (fasting). You may have this done every 1-3 years. Bone density scan. This is done to screen for osteoporosis. You may have this done starting at age 35. Mammogram. This may be done every 1-2 years. Talk to your health care provider about how often you should have regular mammograms. Talk with your health care provider about your test results, treatment options, and if necessary, the need for more tests. Vaccines  Your health care provider may recommend certain vaccines, such as: Influenza vaccine. This is recommended every year. Tetanus, diphtheria, and acellular pertussis (Tdap, Td) vaccine. You may need a Td booster every 10 years. Zoster vaccine. You may need this after age 64. Pneumococcal 13-valent conjugate (PCV13) vaccine. One dose is recommended after age 2. Pneumococcal polysaccharide (PPSV23) vaccine. One dose is recommended after age 38. Talk to your health care provider about which screenings and vaccines you need and how often you need them. This information is not intended to replace advice given to you by your health care  provider. Make sure you discuss any questions you have with your health care provider. Document Released:  01/09/2016 Document Revised: 09/01/2016 Document Reviewed: 10/14/2015 Elsevier Interactive Patient Education  2017 [employer].  Fall Prevention in the Home Falls can cause injuries. They can happen to people of all ages. There are many things you can do to make your home safe and to help prevent falls. What can I do on the outside of my home? Regularly fix the edges of walkways and driveways and fix any cracks. Remove anything that might make you trip as you walk through a door, such as a raised step or threshold. Trim any bushes or trees on the path to your home. Use bright outdoor lighting. Clear any walking paths of anything that might make someone trip, such as rocks or tools. Regularly check to see if handrails are loose or broken. Make sure that both sides of any steps have handrails. Any raised decks and porches should have guardrails on the edges. Have any leaves, snow, or ice cleared regularly. Use sand or salt on walking paths during winter. Clean up any spills in your garage right away. This includes oil or grease spills. What can I do in the bathroom? Use night lights. Install grab bars by the toilet and in the tub and shower. Do not use towel bars as grab bars. Use non-skid mats or decals in the tub or shower. If you need to sit down in the shower, use a plastic, non-slip stool. Keep the floor dry. Clean up any water that spills on the floor as soon as it happens. Remove soap buildup in the tub or shower regularly. Attach bath mats securely with double-sided non-slip rug tape. Do not have throw rugs and other things on the floor that can make you trip. What can I do in the bedroom? Use night lights. Make sure that you have a light by your bed that is easy to reach. Do not use any sheets or blankets that are too big for your bed. They should not hang down onto the floor. Have a firm chair that has side arms. You can use this for support while you get dressed. Do not have  throw rugs and other things on the floor that can make you trip. What can I do in the kitchen? Clean up any spills right away. Avoid walking on wet floors. Keep items that you use a lot in easy-to-reach places. If you need to reach something above you, use a strong step stool that has a grab bar. Keep electrical cords out of the way. Do not use floor polish or wax that makes floors slippery. If you must use wax, use non-skid floor wax. Do not have throw rugs and other things on the floor that can make you trip. What can I do with my stairs? Do not leave any items on the stairs. Make sure that there are handrails on both sides of the stairs and use them. Fix handrails that are broken or loose. Make sure that handrails are as long as the stairways. Check any carpeting to make sure that it is firmly attached to the stairs. Fix any carpet that is loose or worn. Avoid having throw rugs at the top or bottom of the stairs. If you do have throw rugs, attach them to the floor with carpet tape. Make sure that you have a light switch at the top of the stairs and the bottom of the stairs. If you do not have  them, ask someone to add them for you. What else can I do to help prevent falls? Wear shoes that: Do not have high heels. Have rubber bottoms. Are comfortable and fit you well. Are closed at the toe. Do not wear sandals. If you use a stepladder: Make sure that it is fully opened. Do not climb a closed stepladder. Make sure that both sides of the stepladder are locked into place. Ask someone to hold it for you, if possible. Clearly mark and make sure that you can see: Any grab bars or handrails. First and last steps. Where the edge of each step is. Use tools that help you move around (mobility aids) if they are needed. These include: Canes. Walkers. Scooters. Crutches. Turn on the lights when you go into a dark area. Replace any light bulbs as soon as they burn out. Set up your furniture so  you have a clear path. Avoid moving your furniture around. If any of your floors are uneven, fix them. If there are any pets around you, be aware of where they are. Review your medicines with your doctor. Some medicines can make you feel dizzy. This can increase your chance of falling. Ask your doctor what other things that you can do to help prevent falls. This information is not intended to replace advice given to you by your health care provider. Make sure you discuss any questions you have with your health care provider. Document Released: 10/09/2009 Document Revised: 05/20/2016 Document Reviewed: 01/17/2015 Elsevier Interactive Patient Education  2017 [employer].

## 2024-01-31 NOTE — Progress Notes (Signed)
 Subjective:   Lauren Winters is a 58 y.o. female who presents for Medicare Annual (Subsequent) preventive examination.  Visit Complete: Virtual I connected with  Jenan T Kraynak on 01/31/24 by a audio enabled telemedicine application and verified that I am speaking with the correct person using two identifiers.  Patient Location: Home  Provider Location: Home Office  I discussed the limitations of evaluation and management by telemedicine. The patient expressed understanding and agreed to proceed.  Vital Signs: Because this visit was a virtual/telehealth visit, some criteria may be missing or patient reported. Any vitals not documented were not able to be obtained and vitals that have been documented are patient reported.  Patient Medicare AWV questionnaire was completed by the patient on 01-30-2024; I have confirmed that all information answered by patient is correct and no changes since this date.  Cardiac Risk Factors include: diabetes mellitus;hypertension;advanced age (>2men, >6 women)     Objective:    Today's Vitals   01/31/24 1606  PainSc: 6    There is no height or weight on file to calculate BMI.     01/31/2024    4:14 PM 07/19/2022    3:02 PM 03/02/2019    7:00 PM 09/26/2018    4:41 PM 09/12/2018    3:17 PM 08/19/2018    2:20 PM 05/28/2018    2:17 AM  Advanced Directives  Does Patient Have a Medical Advance Directive? Yes No No No No No No  Type of Media Planner of Healthcare Power of Attorney in Chart? Yes - validated most recent copy scanned in chart (See row information)        Would patient like information on creating a medical advance directive? No - Patient declined No - Patient declined No - Patient declined No - Patient declined No - Patient declined No - Patient declined No - Patient declined    Current Medications (verified) Outpatient Encounter Medications as of 01/31/2024  Medication Sig   amLODipine   (NORVASC ) 10 MG tablet TAKE ONE TAB DAILY FOR BLOOD PRESSURE.   Blood Glucose Monitoring Suppl (ONETOUCH VERIO FLEX SYSTEM) w/Device KIT Use as instructed to test blood sugar 3 times daily   buPROPion  (WELLBUTRIN  XL) 300 MG 24 hr tablet TAKE 1 TABLET (300 MG TOTAL) BY MOUTH DAILY. FOR ANXIETY AND DEPRESSION.   clopidogrel  (PLAVIX ) 75 MG tablet Take 1 tablet (75 mg total) by mouth daily.   Continuous Blood Gluc Receiver (FREESTYLE LIBRE 14 DAY READER) DEVI 1 Device by Does not apply route in the morning, at noon, and at bedtime.   Continuous Glucose Sensor (FREESTYLE LIBRE 3 SENSOR) MISC Place 1 sensor on the skin every 14 days. Use to check glucose continuously   escitalopram  (LEXAPRO ) 20 MG tablet Take 1 tablet (20 mg total) by mouth daily. for anxiety and depression.   folic acid  (FOLVITE ) 1 MG tablet Take 2 tablets by mouth daily.   insulin  degludec (TRESIBA  FLEXTOUCH) 100 UNIT/ML FlexTouch Pen Inject 50 Units into the skin daily. for diabetes.   Insulin  Pen Needle (PEN NEEDLES) 31G X 6 MM MISC Use with insulin  as directed.   methotrexate  (RHEUMATREX) 2.5 MG tablet TAKE 7 TABLETS (17.5 MG TOTAL) BY MOUTH ONCE A WEEK. CAUTION:CHEMOTHERAPY. PROTECT FROM LIGHT.   ondansetron  (ZOFRAN ) 4 MG tablet Take 1 tablet (4 mg total) by mouth every 8 (eight) hours as needed for nausea or vomiting.   ONETOUCH VERIO test strip USE AS  INSTRUCTED TO BLOOD SUGAR 3 TIMES DAILY   rosuvastatin  (CRESTOR ) 20 MG tablet TAKE 1 TABLET BY MOUTH EVERY DAY FOR CHOLESTEROL   Semaglutide , 2 MG/DOSE, (OZEMPIC , 2 MG/DOSE,) 8 MG/3ML SOPN Inject 2 mg into the skin once a week. for diabetes.   spironolactone  (ALDACTONE ) 25 MG tablet Take 25 mg by mouth daily.   telmisartan -hydrochlorothiazide  (MICARDIS  HCT) 80-25 MG tablet Take 1 tablet by mouth daily. for blood pressure.   VITAMIN D  PO Take by mouth daily.   Vitamin D , Ergocalciferol , (DRISDOL ) 1.25 MG (50000 UNIT) CAPS capsule Take 1 capsule (50,000 Units total) by mouth every 7  (seven) days.   Vitamin D , Ergocalciferol , (DRISDOL ) 1.25 MG (50000 UNIT) CAPS capsule Take 1 capsule (50,000 Units total) by mouth 2 (two) times a week.   Vitamin D , Ergocalciferol , (DRISDOL ) 1.25 MG (50000 UNIT) CAPS capsule Take 1 capsule (50,000 Units total) by mouth every 7 (seven) days.   metoprolol  succinate (TOPROL -XL) 50 MG 24 hr tablet Take 1 tablet (50 mg total) by mouth daily. For blood pressure   No facility-administered encounter medications on file as of 01/31/2024.    Allergies (verified) Pseudoephedrine hcl, Ancef  [cefazolin ], Clindamycin/lincomycin, Prednisone , Triprolidine hcl, Adhesive [tape], Other, and Sudafed [pseudoephedrine hcl]   History: Past Medical History:  Diagnosis Date   Anxiety    Atypical chest pain    a. ETT 05/2014: normal stress test without evidence of myocardial ischemia or chest pain at peak stress; b. 02/2021 Cor CTA: Ca2+ score = 0. Nl cors.   Complication of anesthesia    migraine headache   Depression    Diabetes mellitus without complication (HCC) metformin    dx 2015   Dyspnea    on exertion   Elevated C-reactive protein (CRP) 06/12/2021   Elevated d-dimer 06/10/2021   Elevated rheumatoid factor 06/10/2021   Fibromyalgia    Headache    Heart murmur    a. told back in 2007, but no mention with 2016 cardiac w/u; b. 02/2019 Echo: EF 60-65%, conc LVH. Nl RV fxn. Mild Ca2+ of MV. Triv AI.   Hyperlipidemia    Hypertension    Local infection of wound 02/07/2018   Lumbar radiculopathy    Lupus (systemic lupus erythematosus) (HCC)    Mitral regurgitation    a. 05/2014 Echo: Mild MR/AI; b. 05/2019 Echo: Mild Ca2+ of MV.   Obesity (BMI 30-39.9)    Palpitations    a. 02/2021 Zio: Predominantly SR @ 75 (44-167). 8 SVT runs (max 167 bpm, longest 10 beats). ? Atrial tach. Triggered events assoc w/ SVT. Otw rare isolated PACs/PVCs. No afib/flutter.   Positive ANA (antinuclear antibody) 06/10/2021   Raynaud's phenomenon    Rheumatoid arthritis (HCC)     Rheumatoid factor positive 08/04/2015   84   S/P lumbar laminectomy 12/09/2017   S/P lumbar spinal fusion 05/26/2018   Sjogren's disease (HCC)    Spinal stenosis    Spondylolisthesis of lumbar region    Stroke [facility])    Tobacco abuse    a. ongoing; b. ongoing SOB   Wound infection after surgery 01/11/2018   Past Surgical History:  Procedure Laterality Date   ABDOMINAL HYSTERECTOMY     BACK SURGERY     BONE MARROW BIOPSY  10/2019   CESAREAN SECTION     x3   LUMBAR LAMINECTOMY     LUMBAR LAMINECTOMY/DECOMPRESSION MICRODISCECTOMY N/A 03/29/2017   Procedure: Re operative Right Lumbar Three-Four Laminectomy and discectomy with Left Lumbar Three-Four, Bilateral Lumbar Four-Five Laminectomy for decompression;  Surgeon:  Morene Hicks Ditty, MD;  Location: [facility] OR;  Service: Neurosurgery;  Laterality: N/A;   spinal cord fusion   11/03/2023   SPINAL CORD STIMULATOR IMPLANT  11/30/2022   SPINAL CORD STIMULATOR INSERTION  12/09/2017   Procedure: LUMBAR SPINAL CORD STIMULATOR IMPLANT;  Surgeon: Joshua Alm RAMAN, MD;  Location: [facility] OR;  Service: Neurosurgery;;   SPINAL CORD STIMULATOR INSERTION     06/18/2022 leads placed, 06/25/2022 battery placed   SPINAL CORD STIMULATOR INSERTION  06/25/2022   SPINAL CORD STIMULATOR REMOVAL N/A 01/11/2018   Procedure: LUMBAR SPINAL CORD STIMULATOR REMOVAL;  Surgeon: Joshua Alm RAMAN, MD;  Location: [facility] OR;  Service: Neurosurgery;  Laterality: N/A;   TUBAL LIGATION     Family History  Problem Relation Age of Onset   CAD Father 57       CABG x 4   Liver cancer Father 54       passed   Valvular heart disease Father    Hypertension Father    CAD Paternal Aunt        CABG   Breast cancer Paternal Aunt    CAD Paternal Aunt        CABG   Breast cancer Paternal Aunt    CAD Paternal Aunt        CABG   Breast cancer Paternal Aunt    CAD Paternal Aunt        CABG   Breast cancer Paternal Aunt    CAD Paternal Aunt        CABG   Breast cancer Paternal Aunt     CAD Paternal Uncle        MI s/p CABG   Stroke Maternal Grandmother    Stroke Maternal Grandfather    CAD Maternal Grandfather    Heart Problems Brother    Social History   Socioeconomic History   Marital status: Married    Spouse name: Not on file   Number of children: Not on file   Years of education: Not on file   Highest education level: GED or equivalent  Occupational History   Not on file  Tobacco Use   Smoking status: Former    Current packs/day: 0.00    Average packs/day: 0.5 packs/day for 10.0 years (5.0 ttl pk-yrs)    Types: Cigarettes    Start date: 07/21/2005    Quit date: 07/22/2015    Years since quitting: 8.5    Passive exposure: Never   Smokeless tobacco: Never  Vaping Use   Vaping status: Never Used  Substance and Sexual Activity   Alcohol use: No    Alcohol/week: 0.0 standard drinks of alcohol   Drug use: No   Sexual activity: Not Currently  Other Topics Concern   Not on file  Social History Narrative   Married.   3 children.   Work Presenter, Broadcasting at [employer].   Enjoys sleeping, relaxing.    Social Drivers of Corporate Investment Banker Strain: Low Risk  (01/31/2024)   Overall Financial Resource Strain (CARDIA)    Difficulty of Paying Living Expenses: Not very hard  Food Insecurity: No Food Insecurity (01/31/2024)   Hunger Vital Sign    Worried About Running Out of Food in the Last Year: Never true    Ran Out of Food in the Last Year: Never true  Transportation Needs: Unmet Transportation Needs (01/31/2024)   PRAPARE - Administrator, Civil Service (Medical): Yes    Lack of Transportation (Non-Medical): No  Physical Activity: Inactive (01/31/2024)   Exercise Vital Sign    Days of Exercise per Week: 0 days    Minutes of Exercise per Session: 20 min  Stress: Stress Concern Present (01/31/2024)   [employer] of Occupational Health - Occupational Stress Questionnaire    Feeling of Stress : Very much  Social Connections:  Moderately Isolated (01/31/2024)   Social Connection and Isolation Panel [NHANES]    Frequency of Communication with Friends and Family: More than three times a week    Frequency of Social Gatherings with Friends and Family: More than three times a week    Attends Religious Services: Never    Database Administrator or Organizations: No    Attends Engineer, Structural: Never    Marital Status: Married    Tobacco Counseling Counseling given: Not Answered   Clinical Intake:  Pre-visit preparation completed: Yes  Pain : 0-10 Pain Score: 6  Pain Type: Chronic pain Pain Location: Back Pain Descriptors / Indicators: Burning, Constant, Aching Pain Onset: More than a month ago Pain Frequency: Constant     Diabetes: Yes CBG done?: No Did pt. bring in CBG monitor from home?: No  How often do you need to have someone help you when you read instructions, pamphlets, or other written materials from your doctor or pharmacy?: 1 - Never  Interpreter Needed?: No  Information entered by :: Mliss Graff LPN   Activities of Daily Living    01/31/2024    4:08 PM 01/30/2024    5:08 PM  In your present state of health, do you have any difficulty performing the following activities:  Hearing? 0 0  Vision? 1 1  Difficulty concentrating or making decisions? 1 1  Walking or climbing stairs? 1 1  Dressing or bathing? 1 1  Doing errands, shopping? 1 1  Preparing Food and eating ? Y Y  Using the Toilet? N N  In the past six months, have you accidently leaked urine? Y Y  Do you have problems with loss of bowel control? N N  Managing your Medications? Y Y  Managing your Finances? Y Y  Housekeeping or managing your Housekeeping? CINDERELLA CINDERELLA    Patient Care Team: Gretta Comer POUR, NP as PCP - General (Nurse Practitioner) Darliss Rogue, MD as PCP - Cardiology (Cardiology) Dolphus Reiter, MD as Consulting Physician (Rheumatology) Leavy Aloysius LABOR, MD (Nephrology) Myra Rosaline FALCON, [facility]  (Inactive) as Pharmacist (Pharmacist)  Indicate any recent Medical Services you may have received from other than [facility] providers in the past year (date may be approximate).     Assessment:   This is a routine wellness examination for Frannie.  Hearing/Vision screen Hearing Screening - Comments:: No trouble hearing Vision Screening - Comments:: Up to date [facility]   Goals Addressed             This Visit's Progress    Patient Stated       Would like to be driving again       Depression Screen    01/31/2024    4:10 PM 09/01/2023    2:05 PM 02/17/2023   12:02 PM 07/19/2022    3:01 PM 03/26/2022    1:58 PM 12/08/2021    2:06 PM 06/03/2021   12:19 PM  PHQ 2/9 Scores  PHQ - 2 Score 6 4 6 1 1  0 5  PHQ- 9 Score 18 16 24 2   0 18    Fall Risk  01/30/2024    5:08 PM 09/01/2023    2:05 PM 02/17/2023   11:38 AM 07/19/2022    3:03 PM 05/31/2022    3:01 PM  Fall Risk   Falls in the past year? 1 0 1 1 1   Number falls in past yr: 0 0 1 1   Injury with Fall? 0 0 0 0 0  Risk for fall due to :  No Fall Risks History of fall(s) History of fall(s)   Follow up  Falls evaluation completed Falls evaluation completed Falls evaluation completed;Falls prevention discussed     MEDICARE RISK AT HOME: Medicare Risk at Home Any stairs in or around the home?: Yes If so, are there any without handrails?: No Home free of loose throw rugs in walkways, pet beds, electrical cords, etc?: Yes Adequate lighting in your home to reduce risk of falls?: Yes Life alert?: No Use of a cane, walker or w/c?: Yes Grab bars in the bathroom?: No Shower chair or bench in shower?: Yes Elevated toilet seat or a handicapped toilet?: No  TIMED UP AND GO:  Was the test performed?  No    Cognitive Function:        01/31/2024    4:08 PM 07/19/2022    3:06 PM  6CIT Screen  What Year? 0 points 0 points  What month? 0 points 0 points  What time? 0 points 0 points  Count back from 20 0 points  0 points  Months in reverse 0 points 0 points  Repeat phrase 6 points 0 points  Total Score 6 points 0 points    Immunizations Immunization History  Administered Date(s) Administered   Influenza, Seasonal, Injecte, Preservative Fre 09/01/2023   Influenza,inj,Quad PF,6+ Mos 10/19/2016, 10/21/2017, 01/31/2019, 10/19/2019, 12/08/2021, 11/08/2022   Moderna Covid-19 Fall Seasonal Vaccine 57yrs & older 11/08/2022   PFIZER(Purple Top)SARS-COV-2 Vaccination 04/17/2020, 05/08/2020   Pneumococcal Polysaccharide-23 10/27/2014, 06/25/2019   Td 04/19/2016   Zoster Recombinant(Shingrix ) 03/26/2022, 02/17/2023   Zoster, Live 10/27/2014    TDAP status: Up to date  Flu Vaccine status: Up to date  Pneumococcal vaccine status: Up to date  Covid-19 vaccine status: Information provided on how to obtain vaccines.   Qualifies for Shingles Vaccine? No   Zostavax completed Yes   Shingrix  Completed?: Yes  Screening Tests Health Maintenance  Topic Date Due   Cervical Cancer Screening (HPV/Pap Cotest)  Never done   Pneumococcal Vaccine 76-75 Years old (3 of 3 - PCV) 06/24/2020   HEMOGLOBIN A1C  08/18/2023   OPHTHALMOLOGY EXAM  11/18/2023   Colonoscopy  02/18/2024 (Originally 03/05/2009)   Diabetic kidney evaluation - Urine ACR  08/15/2024   MAMMOGRAM  08/18/2024   FOOT EXAM  08/31/2024   Diabetic kidney evaluation - eGFR measurement  01/15/2025   Medicare Annual Wellness (AWV)  01/30/2025   DTaP/Tdap/Td (2 - Tdap) 04/19/2026   INFLUENZA VACCINE  Completed   Hepatitis C Screening  Completed   HIV Screening  Completed   Zoster Vaccines- Shingrix   Completed   HPV VACCINES  Aged Out   COVID-19 Vaccine  Discontinued    Health Maintenance  Health Maintenance Due  Topic Date Due   Cervical Cancer Screening (HPV/Pap Cotest)  Never done   Pneumococcal Vaccine 49-7 Years old (3 of 3 - PCV) 06/24/2020   HEMOGLOBIN A1C  08/18/2023   OPHTHALMOLOGY EXAM  11/18/2023    Colonoscopy  postponed  Mammogram status: Completed  . Repeat every year  Lung Cancer Screening: (Low Dose CT Chest recommended  if Age 12-80 years, 20 pack-year currently smoking OR have quit w/in 15years.) does not qualify.   Lung Cancer Screening Referral:   Additional Screening:  Hepatitis C Screening: does not qualify; Completed 2019  Vision Screening: Recommended annual ophthalmology exams for early detection of glaucoma and other disorders of the eye. Is the patient up to date with their annual eye exam?  Yes  Who is the provider or what is the name of the office in which the patient attends annual eye exams? [facility]     If pt is not established with a provider, would they like to be referred to a provider to establish care? No .   Dental Screening: Recommended annual dental exams for proper oral hygiene  Nutrition Risk Assessment:  Has the patient had any N/V/D within the last 2 months?  No  Does the patient have any non-healing wounds?  No  Has the patient had any unintentional weight loss or weight gain?  No   Diabetes:  Is the patient diabetic?  Yes  If diabetic, was a CBG obtained today?  No  Did the patient bring in their glucometer from home?  No  How often do you monitor your CBG's? Has continuous glucose .   Financial Strains and Diabetes Management:  Are you having any financial strains with the device, your supplies or your medication? No .  Does the patient want to be seen by Chronic Care Management for management of their diabetes?  No  Would the patient like to be referred to a Nutritionist or for Diabetic Management?  No   Diabetic Exams:  Diabetic Eye Exam: Completed . Pt has been advised about the importance in completing this exam.   Diabetic Foot Exam: . Pt has been advised about the importance in completing this exam. .    Community Resource Referral / Chronic Care Management: CRR required this visit?  No   CCM required this visit?  No     Plan:      I have personally reviewed and noted the following in the patient's chart:   Medical and social history Use of alcohol, tobacco or illicit drugs  Current medications and supplements including opioid prescriptions. Patient is not currently taking opioid prescriptions. Functional ability and status Nutritional status Physical activity Advanced directives List of other physicians Hospitalizations, surgeries, and ER visits in previous 12 months Vitals Screenings to include cognitive, depression, and falls Referrals and appointments  In addition, I have reviewed and discussed with patient certain preventive protocols, quality metrics, and best practice recommendations. A written personalized care plan for preventive services as well as general preventive health recommendations were provided to patient.     Mliss Graff, LPN   06/30/7973   After Visit Summary: (MyChart) Due to this being a telephonic visit, the after visit summary with patients personalized plan was offered to patient via MyChart   Nurse Notes:

## 2024-02-01 ENCOUNTER — Encounter: Payer: Medicare Other | Admitting: Primary Care

## 2024-02-02 DIAGNOSIS — I63532 Cerebral infarction due to unspecified occlusion or stenosis of left posterior cerebral artery: Secondary | ICD-10-CM | POA: Diagnosis not present

## 2024-02-02 DIAGNOSIS — I63531 Cerebral infarction due to unspecified occlusion or stenosis of right posterior cerebral artery: Secondary | ICD-10-CM | POA: Diagnosis not present

## 2024-02-02 DIAGNOSIS — M51369 Other intervertebral disc degeneration, lumbar region without mention of lumbar back pain or lower extremity pain: Secondary | ICD-10-CM | POA: Diagnosis not present

## 2024-02-02 DIAGNOSIS — R278 Other lack of coordination: Secondary | ICD-10-CM | POA: Diagnosis not present

## 2024-02-02 DIAGNOSIS — M6281 Muscle weakness (generalized): Secondary | ICD-10-CM | POA: Diagnosis not present

## 2024-02-02 DIAGNOSIS — Z4789 Encounter for other orthopedic aftercare: Secondary | ICD-10-CM | POA: Diagnosis not present

## 2024-02-02 DIAGNOSIS — I639 Cerebral infarction, unspecified: Secondary | ICD-10-CM | POA: Diagnosis not present

## 2024-02-02 DIAGNOSIS — Z7409 Other reduced mobility: Secondary | ICD-10-CM | POA: Diagnosis not present

## 2024-02-09 DIAGNOSIS — M6281 Muscle weakness (generalized): Secondary | ICD-10-CM | POA: Diagnosis not present

## 2024-02-09 DIAGNOSIS — M51369 Other intervertebral disc degeneration, lumbar region without mention of lumbar back pain or lower extremity pain: Secondary | ICD-10-CM | POA: Diagnosis not present

## 2024-02-09 DIAGNOSIS — I639 Cerebral infarction, unspecified: Secondary | ICD-10-CM | POA: Diagnosis not present

## 2024-02-09 DIAGNOSIS — Z7409 Other reduced mobility: Secondary | ICD-10-CM | POA: Diagnosis not present

## 2024-02-09 DIAGNOSIS — Z4789 Encounter for other orthopedic aftercare: Secondary | ICD-10-CM | POA: Diagnosis not present

## 2024-02-13 DIAGNOSIS — Z981 Arthrodesis status: Secondary | ICD-10-CM | POA: Diagnosis not present

## 2024-02-14 DIAGNOSIS — Z7409 Other reduced mobility: Secondary | ICD-10-CM | POA: Diagnosis not present

## 2024-02-14 DIAGNOSIS — I639 Cerebral infarction, unspecified: Secondary | ICD-10-CM | POA: Diagnosis not present

## 2024-02-14 DIAGNOSIS — M51369 Other intervertebral disc degeneration, lumbar region without mention of lumbar back pain or lower extremity pain: Secondary | ICD-10-CM | POA: Diagnosis not present

## 2024-02-14 DIAGNOSIS — Z4789 Encounter for other orthopedic aftercare: Secondary | ICD-10-CM | POA: Diagnosis not present

## 2024-02-14 DIAGNOSIS — M6281 Muscle weakness (generalized): Secondary | ICD-10-CM | POA: Diagnosis not present

## 2024-02-17 DIAGNOSIS — H540X34 Blindness right eye category 3, blindness left eye category 4: Secondary | ICD-10-CM | POA: Diagnosis not present

## 2024-02-17 DIAGNOSIS — H53483 Generalized contraction of visual field, bilateral: Secondary | ICD-10-CM | POA: Diagnosis not present

## 2024-02-20 DIAGNOSIS — H539 Unspecified visual disturbance: Secondary | ICD-10-CM | POA: Diagnosis not present

## 2024-02-20 DIAGNOSIS — M329 Systemic lupus erythematosus, unspecified: Secondary | ICD-10-CM | POA: Diagnosis not present

## 2024-02-20 DIAGNOSIS — D472 Monoclonal gammopathy: Secondary | ICD-10-CM | POA: Diagnosis not present

## 2024-02-20 DIAGNOSIS — M545 Low back pain, unspecified: Secondary | ICD-10-CM | POA: Diagnosis not present

## 2024-02-20 DIAGNOSIS — G8929 Other chronic pain: Secondary | ICD-10-CM | POA: Diagnosis not present

## 2024-02-20 DIAGNOSIS — M4327 Fusion of spine, lumbosacral region: Secondary | ICD-10-CM | POA: Diagnosis not present

## 2024-02-20 DIAGNOSIS — N189 Chronic kidney disease, unspecified: Secondary | ICD-10-CM | POA: Diagnosis not present

## 2024-02-20 DIAGNOSIS — Z8673 Personal history of transient ischemic attack (TIA), and cerebral infarction without residual deficits: Secondary | ICD-10-CM | POA: Diagnosis not present

## 2024-02-20 DIAGNOSIS — C9000 Multiple myeloma not having achieved remission: Secondary | ICD-10-CM | POA: Diagnosis not present

## 2024-02-21 DIAGNOSIS — M51369 Other intervertebral disc degeneration, lumbar region without mention of lumbar back pain or lower extremity pain: Secondary | ICD-10-CM | POA: Diagnosis not present

## 2024-02-21 DIAGNOSIS — Z4789 Encounter for other orthopedic aftercare: Secondary | ICD-10-CM | POA: Diagnosis not present

## 2024-02-21 DIAGNOSIS — I639 Cerebral infarction, unspecified: Secondary | ICD-10-CM | POA: Diagnosis not present

## 2024-02-21 DIAGNOSIS — M6281 Muscle weakness (generalized): Secondary | ICD-10-CM | POA: Diagnosis not present

## 2024-02-21 DIAGNOSIS — Z7409 Other reduced mobility: Secondary | ICD-10-CM | POA: Diagnosis not present

## 2024-02-23 DIAGNOSIS — Z1159 Encounter for screening for other viral diseases: Secondary | ICD-10-CM | POA: Diagnosis not present

## 2024-02-23 DIAGNOSIS — E119 Type 2 diabetes mellitus without complications: Secondary | ICD-10-CM | POA: Diagnosis not present

## 2024-02-23 DIAGNOSIS — L932 Other local lupus erythematosus: Secondary | ICD-10-CM | POA: Diagnosis not present

## 2024-02-23 DIAGNOSIS — D8989 Other specified disorders involving the immune mechanism, not elsewhere classified: Secondary | ICD-10-CM | POA: Diagnosis not present

## 2024-02-23 DIAGNOSIS — Z8673 Personal history of transient ischemic attack (TIA), and cerebral infarction without residual deficits: Secondary | ICD-10-CM | POA: Diagnosis not present

## 2024-02-23 DIAGNOSIS — M159 Polyosteoarthritis, unspecified: Secondary | ICD-10-CM | POA: Diagnosis not present

## 2024-02-25 DIAGNOSIS — H53483 Generalized contraction of visual field, bilateral: Secondary | ICD-10-CM | POA: Diagnosis not present

## 2024-02-25 DIAGNOSIS — H540X34 Blindness right eye category 3, blindness left eye category 4: Secondary | ICD-10-CM | POA: Diagnosis not present

## 2024-02-27 ENCOUNTER — Other Ambulatory Visit: Payer: Self-pay | Admitting: Physician Assistant

## 2024-02-28 NOTE — Telephone Encounter (Signed)
 Last Fill: 08/16/2023  Next Visit: 04/16/2024  Last Visit: 01/16/2024  Dx: Sjogren's syndrome with other organ involvement   Current Dose per office note on 01/16/2024: not discussed  Okay to refill Zofran?

## 2024-03-01 DIAGNOSIS — Z4789 Encounter for other orthopedic aftercare: Secondary | ICD-10-CM | POA: Diagnosis not present

## 2024-03-01 DIAGNOSIS — M6281 Muscle weakness (generalized): Secondary | ICD-10-CM | POA: Diagnosis not present

## 2024-03-01 DIAGNOSIS — M51369 Other intervertebral disc degeneration, lumbar region without mention of lumbar back pain or lower extremity pain: Secondary | ICD-10-CM | POA: Diagnosis not present

## 2024-03-01 DIAGNOSIS — I63532 Cerebral infarction due to unspecified occlusion or stenosis of left posterior cerebral artery: Secondary | ICD-10-CM | POA: Diagnosis not present

## 2024-03-01 DIAGNOSIS — Z981 Arthrodesis status: Secondary | ICD-10-CM | POA: Diagnosis not present

## 2024-03-01 DIAGNOSIS — Z7409 Other reduced mobility: Secondary | ICD-10-CM | POA: Diagnosis not present

## 2024-03-01 DIAGNOSIS — I63531 Cerebral infarction due to unspecified occlusion or stenosis of right posterior cerebral artery: Secondary | ICD-10-CM | POA: Diagnosis not present

## 2024-03-01 DIAGNOSIS — I639 Cerebral infarction, unspecified: Secondary | ICD-10-CM | POA: Diagnosis not present

## 2024-03-02 ENCOUNTER — Other Ambulatory Visit: Payer: Self-pay | Admitting: Primary Care

## 2024-03-02 DIAGNOSIS — Z794 Long term (current) use of insulin: Secondary | ICD-10-CM

## 2024-03-03 DIAGNOSIS — H540X34 Blindness right eye category 3, blindness left eye category 4: Secondary | ICD-10-CM | POA: Diagnosis not present

## 2024-03-03 DIAGNOSIS — H53483 Generalized contraction of visual field, bilateral: Secondary | ICD-10-CM | POA: Diagnosis not present

## 2024-03-21 ENCOUNTER — Encounter: Payer: Medicare Other | Admitting: Primary Care

## 2024-03-22 DIAGNOSIS — Z8673 Personal history of transient ischemic attack (TIA), and cerebral infarction without residual deficits: Secondary | ICD-10-CM | POA: Diagnosis not present

## 2024-03-22 DIAGNOSIS — Z794 Long term (current) use of insulin: Secondary | ICD-10-CM

## 2024-03-22 DIAGNOSIS — L932 Other local lupus erythematosus: Secondary | ICD-10-CM | POA: Diagnosis not present

## 2024-03-22 DIAGNOSIS — E119 Type 2 diabetes mellitus without complications: Secondary | ICD-10-CM | POA: Diagnosis not present

## 2024-03-23 ENCOUNTER — Other Ambulatory Visit: Payer: Self-pay | Admitting: Physician Assistant

## 2024-03-23 DIAGNOSIS — I63531 Cerebral infarction due to unspecified occlusion or stenosis of right posterior cerebral artery: Secondary | ICD-10-CM | POA: Diagnosis not present

## 2024-03-23 DIAGNOSIS — Z79899 Other long term (current) drug therapy: Secondary | ICD-10-CM

## 2024-03-23 DIAGNOSIS — I10 Essential (primary) hypertension: Secondary | ICD-10-CM | POA: Diagnosis not present

## 2024-03-23 DIAGNOSIS — E785 Hyperlipidemia, unspecified: Secondary | ICD-10-CM | POA: Diagnosis not present

## 2024-03-23 DIAGNOSIS — E119 Type 2 diabetes mellitus without complications: Secondary | ICD-10-CM | POA: Diagnosis not present

## 2024-03-23 DIAGNOSIS — Z794 Long term (current) use of insulin: Secondary | ICD-10-CM | POA: Diagnosis not present

## 2024-03-23 DIAGNOSIS — E782 Mixed hyperlipidemia: Secondary | ICD-10-CM | POA: Diagnosis not present

## 2024-03-23 DIAGNOSIS — R251 Tremor, unspecified: Secondary | ICD-10-CM | POA: Diagnosis not present

## 2024-03-23 DIAGNOSIS — Z87891 Personal history of nicotine dependence: Secondary | ICD-10-CM | POA: Diagnosis not present

## 2024-03-23 MED ORDER — OZEMPIC (2 MG/DOSE) 8 MG/3ML ~~LOC~~ SOPN: 2.0000 mg | PEN_INJECTOR | SUBCUTANEOUS | 0 refills | Status: DC

## 2024-03-23 NOTE — Telephone Encounter (Signed)
 Last Fill: 12/12/2023  Labs: 02/20/2024 Glucose 65  Next Visit: 04/16/2024  Last Visit: 01/16/2024  DX: Sjogren's syndrome with other organ involvement   Current Dose per office note 01/16/2024: methotrexate 7 tablets by mouth once weekly   Okay to refill Methotrexate?

## 2024-03-23 NOTE — Addendum Note (Signed)
 Addended by: Henriette Combs on: 03/23/2024 11:24 AM   Modules accepted: Orders

## 2024-03-24 DIAGNOSIS — H540X34 Blindness right eye category 3, blindness left eye category 4: Secondary | ICD-10-CM | POA: Diagnosis not present

## 2024-03-24 DIAGNOSIS — H53483 Generalized contraction of visual field, bilateral: Secondary | ICD-10-CM | POA: Diagnosis not present

## 2024-03-28 ENCOUNTER — Other Ambulatory Visit: Payer: Self-pay | Admitting: Primary Care

## 2024-03-28 DIAGNOSIS — I10 Essential (primary) hypertension: Secondary | ICD-10-CM

## 2024-03-28 DIAGNOSIS — I639 Cerebral infarction, unspecified: Secondary | ICD-10-CM

## 2024-03-29 DIAGNOSIS — I63531 Cerebral infarction due to unspecified occlusion or stenosis of right posterior cerebral artery: Secondary | ICD-10-CM | POA: Diagnosis not present

## 2024-03-29 DIAGNOSIS — M51369 Other intervertebral disc degeneration, lumbar region without mention of lumbar back pain or lower extremity pain: Secondary | ICD-10-CM | POA: Diagnosis not present

## 2024-03-29 DIAGNOSIS — I639 Cerebral infarction, unspecified: Secondary | ICD-10-CM | POA: Diagnosis not present

## 2024-03-29 DIAGNOSIS — Z4789 Encounter for other orthopedic aftercare: Secondary | ICD-10-CM | POA: Diagnosis not present

## 2024-03-29 DIAGNOSIS — M6281 Muscle weakness (generalized): Secondary | ICD-10-CM | POA: Diagnosis not present

## 2024-03-29 DIAGNOSIS — I63532 Cerebral infarction due to unspecified occlusion or stenosis of left posterior cerebral artery: Secondary | ICD-10-CM | POA: Diagnosis not present

## 2024-03-29 DIAGNOSIS — Z7409 Other reduced mobility: Secondary | ICD-10-CM | POA: Diagnosis not present

## 2024-04-03 DIAGNOSIS — Z7409 Other reduced mobility: Secondary | ICD-10-CM | POA: Diagnosis not present

## 2024-04-03 DIAGNOSIS — Z4789 Encounter for other orthopedic aftercare: Secondary | ICD-10-CM | POA: Diagnosis not present

## 2024-04-03 DIAGNOSIS — M6281 Muscle weakness (generalized): Secondary | ICD-10-CM | POA: Diagnosis not present

## 2024-04-03 DIAGNOSIS — I639 Cerebral infarction, unspecified: Secondary | ICD-10-CM | POA: Diagnosis not present

## 2024-04-03 DIAGNOSIS — M51369 Other intervertebral disc degeneration, lumbar region without mention of lumbar back pain or lower extremity pain: Secondary | ICD-10-CM | POA: Diagnosis not present

## 2024-04-04 ENCOUNTER — Encounter: Payer: Self-pay | Admitting: Primary Care

## 2024-04-04 ENCOUNTER — Ambulatory Visit: Admitting: Primary Care

## 2024-04-04 VITALS — BP 128/74 | HR 67 | Temp 97.2°F | Ht 66.0 in | Wt 203.0 lb

## 2024-04-04 DIAGNOSIS — M0579 Rheumatoid arthritis with rheumatoid factor of multiple sites without organ or systems involvement: Secondary | ICD-10-CM | POA: Diagnosis not present

## 2024-04-04 DIAGNOSIS — E559 Vitamin D deficiency, unspecified: Secondary | ICD-10-CM

## 2024-04-04 DIAGNOSIS — D472 Monoclonal gammopathy: Secondary | ICD-10-CM

## 2024-04-04 DIAGNOSIS — Z1231 Encounter for screening mammogram for malignant neoplasm of breast: Secondary | ICD-10-CM

## 2024-04-04 DIAGNOSIS — Z981 Arthrodesis status: Secondary | ICD-10-CM

## 2024-04-04 DIAGNOSIS — I10 Essential (primary) hypertension: Secondary | ICD-10-CM

## 2024-04-04 DIAGNOSIS — Z1211 Encounter for screening for malignant neoplasm of colon: Secondary | ICD-10-CM

## 2024-04-04 DIAGNOSIS — Z0001 Encounter for general adult medical examination with abnormal findings: Secondary | ICD-10-CM

## 2024-04-04 DIAGNOSIS — Z7985 Long-term (current) use of injectable non-insulin antidiabetic drugs: Secondary | ICD-10-CM

## 2024-04-04 DIAGNOSIS — E2839 Other primary ovarian failure: Secondary | ICD-10-CM

## 2024-04-04 DIAGNOSIS — F411 Generalized anxiety disorder: Secondary | ICD-10-CM

## 2024-04-04 DIAGNOSIS — F331 Major depressive disorder, recurrent, moderate: Secondary | ICD-10-CM

## 2024-04-04 DIAGNOSIS — E1165 Type 2 diabetes mellitus with hyperglycemia: Secondary | ICD-10-CM | POA: Diagnosis not present

## 2024-04-04 DIAGNOSIS — Z8673 Personal history of transient ischemic attack (TIA), and cerebral infarction without residual deficits: Secondary | ICD-10-CM

## 2024-04-04 DIAGNOSIS — E785 Hyperlipidemia, unspecified: Secondary | ICD-10-CM

## 2024-04-04 DIAGNOSIS — M3500 Sicca syndrome, unspecified: Secondary | ICD-10-CM

## 2024-04-04 DIAGNOSIS — M797 Fibromyalgia: Secondary | ICD-10-CM

## 2024-04-04 DIAGNOSIS — Z23 Encounter for immunization: Secondary | ICD-10-CM

## 2024-04-04 LAB — POCT GLYCOSYLATED HEMOGLOBIN (HGB A1C): Hemoglobin A1C: 6 % — AB (ref 4.0–5.6)

## 2024-04-04 LAB — VITAMIN D 25 HYDROXY (VIT D DEFICIENCY, FRACTURES): VITD: 15.51 ng/mL — ABNORMAL LOW (ref 30.00–100.00)

## 2024-04-04 LAB — T4, FREE: Free T4: 0.72 ng/dL (ref 0.60–1.60)

## 2024-04-04 LAB — VITAMIN B12: Vitamin B-12: 190 pg/mL — ABNORMAL LOW (ref 211–911)

## 2024-04-04 LAB — MAGNESIUM: Magnesium: 1.9 mg/dL (ref 1.5–2.5)

## 2024-04-04 LAB — TSH: TSH: 0.78 u[IU]/mL (ref 0.35–5.50)

## 2024-04-04 MED ORDER — TIRZEPATIDE 2.5 MG/0.5ML ~~LOC~~ SOAJ
2.5000 mg | SUBCUTANEOUS | 0 refills | Status: DC
Start: 2024-04-04 — End: 2024-05-25

## 2024-04-04 NOTE — Assessment & Plan Note (Signed)
 Deteriorated since stroke in November 2024.  She has failed multiple medications from different classes, therefore, we both agree that psychiatry evaluation will be the best step forward. Continue bupropion XL 300 mg daily, Lexapro 20 mg daily.  Referral placed for psychiatry.

## 2024-04-04 NOTE — Assessment & Plan Note (Signed)
 In remission.  Following with hematology/oncology. Reviewed office notes and labs from February 2025 through Care Everywhere.

## 2024-04-04 NOTE — Assessment & Plan Note (Signed)
 Stable.  Continue PT for mobility, balance from surgery and from CVA in November 2024.

## 2024-04-04 NOTE — Assessment & Plan Note (Signed)
 Questionable diagnosis now of her [facility] rheumatology. Follow-up with rheumatology as scheduled.

## 2024-04-04 NOTE — Assessment & Plan Note (Signed)
 Controlled.  Continue amlodipine 10 mg daily, spironolactone 25 mg daily, telmisartan-hydrochlorothiazide 80-25 mg daily. CMP reviewed from February 2025.

## 2024-04-04 NOTE — Assessment & Plan Note (Signed)
Repeat vitamin D level pending. 

## 2024-04-04 NOTE — Patient Instructions (Addendum)
 Stop by the lab prior to leaving today. I will notify you of your results once received.   Complete the Cologuard colon cancer screening kit once received.  Start tirzepitide Greggory Keen) for diabetes/weight loss. Start by injecting 2.5 mg into the skin once weekly for 4 weeks, then increase to 5 mg once weekly thereafter. Please notify me once you've used your last 2.5 mg pen so that I can prescribe the next dose.   Stop Ozempic once you start the [facility].  You will either be contacted via phone regarding your referral to psychiatry, or you may receive a letter on your MyChart portal from our referral team with instructions for scheduling an appointment. Please let us know if you have not been contacted by anyone within two weeks.  [facility] to schedule your mammogram and bone density scan.   Please schedule a follow up visit for 3 months for diabetes check.  It was a pleasure to see you today!

## 2024-04-04 NOTE — Assessment & Plan Note (Signed)
 Lipid panel reviewed from March 2025 through Care Everywhere. Continue rosuvastatin 20 mg daily.

## 2024-04-04 NOTE — Assessment & Plan Note (Signed)
 Following with rheumatology. Reviewed office notes from January 2025.

## 2024-04-04 NOTE — Progress Notes (Signed)
 Subjective:    Patient ID: Lauren Winters, female    DOB: [birth year], 58 y.o.   MRN: [id number]  HPI  Lauren Winters is a very pleasant 58 y.o. female who presents today for complete physical and follow up of chronic conditions.  Her friend joins her today who is helping to provide information for HPI.   She would also like to discuss anxiety and depression symptoms. Increased in November 2024 after her stroke. Symptoms include feeling down/sad, little motivation to do things, not wanting to get out of bed, worrying, difficulty falling asleep due to mind racing thoughts. She is managed on Lexapro 20 mg daily, bupropion XL 300 mg daily.   She was previously managed on Cymbalta, Zoloft, Lexapro, Effexor. Most of these medications lost their effectiveness. She has not seen psychiatry.   Immunizations: -Tetanus: Completed in 2017 -Shingles: Completed Shingrix series -Pneumonia: Completed pneumovax 23 in 2020  Diet: Fair diet.  Exercise: No regular exercise.  Eye exam: Completes annually  Dental exam: Completed several years    Pap Smear: Hysterectomy Mammogram: Completed in August 2023 Bone Density Scan: Completed in August 2023  Colonoscopy: Never completed.   Wt Readings from Last 3 Encounters:  04/04/24 203 lb (92.1 kg)  01/16/24 202 lb 6.4 oz (91.8 kg)  09/01/23 205 lb (93 kg)     BP Readings from Last 3 Encounters:  04/04/24 128/74  01/16/24 137/82  09/01/23 126/72       Review of Systems  Constitutional:  Negative for unexpected weight change.  HENT:  Negative for rhinorrhea.   Respiratory:  Negative for cough and shortness of breath.   Cardiovascular:  Negative for chest pain.  Gastrointestinal:  Negative for constipation and diarrhea.  Genitourinary:  Negative for difficulty urinating.  Musculoskeletal:  Positive for arthralgias and back pain.  Skin:  Negative for rash.  Allergic/Immunologic: Negative for environmental allergies.  Neurological:   Positive for weakness. Negative for dizziness and headaches.  Psychiatric/Behavioral:  The patient is nervous/anxious.        See HPI         Past Medical History:  Diagnosis Date   Anxiety    Atypical chest pain    a. ETT 05/2014: normal stress test without evidence of myocardial ischemia or chest pain at peak stress; b. 02/2021 Cor CTA: Ca2+ score = 0. Nl cors.   Chronic anticoagulation (Plavix) 06/10/2021   PLAVIX Anticoagulation (Stop: 7-10 days  Restart: 2 hours)   PLAVIX + PLAQUENIL Anticoagulation     Complication of anesthesia    migraine headache   Depression    Diabetes mellitus without complication (HCC) metformin   dx 2015   Dyspnea    on exertion   Elevated C-reactive protein (CRP) 06/12/2021   Elevated d-dimer 06/10/2021   Elevated rheumatoid factor 06/10/2021   Fibromyalgia    Headache    Heart murmur    a. told back in 2007, but no mention with 2016 cardiac w/u; b. 02/2019 Echo: EF 60-65%, conc LVH. Nl RV fxn. Mild Ca2+ of MV. Triv AI.   Hyperlipidemia    Hypertension    Local infection of wound 02/07/2018   Lumbar radiculopathy    Lupus (systemic lupus erythematosus) (HCC)    Mitral regurgitation    a. 05/2014 Echo: Mild MR/AI; b. 05/2019 Echo: Mild Ca2+ of MV.   Obesity (BMI 30-39.9)    Palpitations    a. 02/2021 Zio: Predominantly SR @ 75 (44-167). 8 SVT runs (max 167 bpm, longest  10 beats). ? Atrial tach. Triggered events assoc w/ SVT. Otw rare isolated PACs/PVCs. No afib/flutter.   Positive ANA (antinuclear antibody) 06/10/2021   Raynaud's phenomenon    Rheumatoid arthritis (HCC)    Rheumatoid factor positive 08/04/2015   84   S/P lumbar laminectomy 12/09/2017   S/P lumbar spinal fusion 05/26/2018   Sjogren's disease (HCC)    Spinal stenosis    Spondylolisthesis of lumbar region    Stroke [facility])    Tobacco abuse    a. ongoing; b. ongoing SOB   Wound infection after surgery 01/11/2018    Social History   Socioeconomic History   Marital status:  Married    Spouse name: Not on file   Number of children: Not on file   Years of education: Not on file   Highest education level: GED or equivalent  Occupational History   Not on file  Tobacco Use   Smoking status: Former    Current packs/day: 0.00    Average packs/day: 0.5 packs/day for 10.0 years (5.0 ttl pk-yrs)    Types: Cigarettes    Start date: 07/21/2005    Quit date: 07/22/2015    Years since quitting: 8.7    Passive exposure: Never   Smokeless tobacco: Never  Vaping Use   Vaping status: Never Used  Substance and Sexual Activity   Alcohol use: No    Alcohol/week: 0.0 standard drinks of alcohol   Drug use: No   Sexual activity: Not Currently  Other Topics Concern   Not on file  Social History Narrative   Married.   3 children.   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    Social Drivers of Corporate investment banker Strain: Low Risk  (01/31/2024)   Overall Financial Resource Strain (CARDIA)    Difficulty of Paying Living Expenses: Not very hard  Food Insecurity: No Food Insecurity (01/31/2024)   Hunger Vital Sign    Worried About Running Out of Food in the Last Year: Never true    Ran Out of Food in the Last Year: Never true  Transportation Needs: Unmet Transportation Needs (01/31/2024)   PRAPARE - Administrator, Civil Service (Medical): Yes    Lack of Transportation (Non-Medical): No  Physical Activity: Inactive (01/31/2024)   Exercise Vital Sign    Days of Exercise per Week: 0 days    Minutes of Exercise per Session: 20 min  Stress: Stress Concern Present (01/31/2024)   [employer] of Occupational Health - Occupational Stress Questionnaire    Feeling of Stress : Very much  Social Connections: Moderately Isolated (01/31/2024)   Social Connection and Isolation Panel [NHANES]    Frequency of Communication with Friends and Family: More than three times a week    Frequency of Social Gatherings with Friends and Family:  More than three times a week    Attends Religious Services: Never    Database administrator or Organizations: No    Attends Banker Meetings: Never    Marital Status: Married  Catering manager Violence: Not At Risk (01/31/2024)   Humiliation, Afraid, Rape, and Kick questionnaire    Fear of Current or Ex-Partner: No    Emotionally Abused: No    Physically Abused: No    Sexually Abused: No    Past Surgical History:  Procedure Laterality Date   ABDOMINAL HYSTERECTOMY     BACK SURGERY     BONE MARROW BIOPSY  10/2019   CESAREAN  SECTION     x3   LUMBAR LAMINECTOMY     LUMBAR LAMINECTOMY/DECOMPRESSION MICRODISCECTOMY N/A 03/29/2017   Procedure: Re operative Right Lumbar Three-Four Laminectomy and discectomy with Left Lumbar Three-Four, Bilateral Lumbar Four-Five Laminectomy for decompression;  Surgeon: Loura Halt Ditty, MD;  Location: [facility] OR;  Service: Neurosurgery;  Laterality: N/A;   spinal cord fusion   11/03/2023   SPINAL CORD STIMULATOR IMPLANT  11/30/2022   SPINAL CORD STIMULATOR INSERTION  12/09/2017   Procedure: LUMBAR SPINAL CORD STIMULATOR IMPLANT;  Surgeon: Tia Alert, MD;  Location: [facility] OR;  Service: Neurosurgery;;   SPINAL CORD STIMULATOR INSERTION     06/18/2022 leads placed, 06/25/2022 battery placed   SPINAL CORD STIMULATOR INSERTION  06/25/2022   SPINAL CORD STIMULATOR REMOVAL N/A 01/11/2018   Procedure: LUMBAR SPINAL CORD STIMULATOR REMOVAL;  Surgeon: Tia Alert, MD;  Location: [facility] OR;  Service: Neurosurgery;  Laterality: N/A;   TUBAL LIGATION      Family History  Problem Relation Age of Onset   CAD Father 1       CABG x 4   Liver cancer Father 62       passed   Valvular heart disease Father    Hypertension Father    CAD Paternal Aunt        CABG   Breast cancer Paternal Aunt    CAD Paternal Aunt        CABG   Breast cancer Paternal Aunt    CAD Paternal Aunt        CABG   Breast cancer Paternal Aunt    CAD Paternal Aunt        CABG    Breast cancer Paternal Aunt    CAD Paternal Aunt        CABG   Breast cancer Paternal Aunt    CAD Paternal Uncle        MI s/p CABG   Stroke Maternal Grandmother    Stroke Maternal Grandfather    CAD Maternal Grandfather    Heart Problems Brother     Allergies  Allergen Reactions   Pseudoephedrine Hcl Shortness Of Breath and Other (See Comments)    Room starts spinning, can't breath   Ancef [Cefazolin] Other (See Comments)    Unknown   Clindamycin/Lincomycin Hives   Prednisone Other (See Comments)    Chest pain w/ cortisone shots or prednisone pills Elevated blood pressure   Triprolidine Hcl    Adhesive [Tape] Rash and Other (See Comments)    Sensitivity to certain band aids and adhesives - burns skin, makes skin raw   Other Rash and Other (See Comments)    Sensitivity to certain band aids and adhesives - burns skin, makes skin raw   Sudafed [Pseudoephedrine Hcl] Other (See Comments)    Dizziness     Current Outpatient Medications on File Prior to Visit  Medication Sig Dispense Refill   amLODipine (NORVASC) 10 MG tablet TAKE ONE TAB DAILY FOR BLOOD PRESSURE. 90 tablet 0   Blood Glucose Monitoring Suppl (ONETOUCH VERIO FLEX SYSTEM) w/Device KIT Use as instructed to test blood sugar 3 times daily 1 kit 0   buPROPion (WELLBUTRIN XL) 300 MG 24 hr tablet TAKE 1 TABLET (300 MG TOTAL) BY MOUTH DAILY. FOR ANXIETY AND DEPRESSION. 90 tablet 1   Continuous Blood Gluc Receiver (FREESTYLE LIBRE 14 DAY READER) DEVI 1 Device by Does not apply route in the morning, at noon, and at bedtime. 1 each 0   Continuous Glucose Sensor (  FREESTYLE LIBRE 3 SENSOR) MISC Place 1 sensor on the skin every 14 days. Use to check glucose continuously 6 each 1   escitalopram (LEXAPRO) 20 MG tablet Take 1 tablet (20 mg total) by mouth daily. for anxiety and depression. 90 tablet 0   folic acid (FOLVITE) 1 MG tablet Take 2 tablets by mouth daily. 180 tablet 3   insulin degludec (TRESIBA FLEXTOUCH) 100 UNIT/ML  FlexTouch Pen Inject 50 Units into the skin daily. for diabetes. 45 mL 0   Insulin Pen Needle (PEN NEEDLES) 31G X 6 MM MISC Use with insulin as directed. 100 each 0   ondansetron (ZOFRAN) 4 MG tablet TAKE 1 TABLET BY MOUTH EVERY 8 HOURS AS NEEDED FOR NAUSEA AND VOMITING 20 tablet 0   ONETOUCH VERIO test strip USE AS INSTRUCTED TO BLOOD SUGAR 3 TIMES DAILY 300 strip 2   rosuvastatin (CRESTOR) 20 MG tablet TAKE 1 TABLET BY MOUTH EVERY DAY FOR CHOLESTEROL 90 tablet 1   spironolactone (ALDACTONE) 25 MG tablet Take 25 mg by mouth daily.     telmisartan-hydrochlorothiazide (MICARDIS HCT) 80-25 MG tablet Take 1 tablet by mouth daily. for blood pressure. 90 tablet 2   VITAMIN D PO Take 3,000 Units by mouth daily.     metoprolol succinate (TOPROL-XL) 50 MG 24 hr tablet Take 1 tablet (50 mg total) by mouth daily. For blood pressure 90 tablet 3   No current facility-administered medications on file prior to visit.    BP 128/74   Pulse 67   Temp (!) 97.2 F (36.2 C) (Temporal)   Ht 5\' 6"  (1.676 m)   Wt 203 lb (92.1 kg)   SpO2 100%   BMI 32.77 kg/m  Objective:   Physical Exam HENT:     Right Ear: Tympanic membrane and ear canal normal.     Left Ear: Tympanic membrane and ear canal normal.  Eyes:     Pupils: Pupils are equal, round, and reactive to light.  Cardiovascular:     Rate and Rhythm: Normal rate and regular rhythm.  Pulmonary:     Effort: Pulmonary effort is normal.     Breath sounds: Normal breath sounds.  Abdominal:     General: Bowel sounds are normal.     Palpations: Abdomen is soft.     Tenderness: There is no abdominal tenderness.  Musculoskeletal:     Cervical back: Neck supple.     Lumbar back: Decreased range of motion.     Comments: Ambulating with cane.  Skin:    General: Skin is warm and dry.  Neurological:     Mental Status: She is alert and oriented to person, place, and time.     Cranial Nerves: No cranial nerve deficit.     Deep Tendon Reflexes:     Reflex  Scores:      Patellar reflexes are 2+ on the right side and 2+ on the left side. Psychiatric:        Mood and Affect: Mood normal.           Assessment & Plan:  Encounter for annual general medical examination with abnormal findings in adult Assessment & Plan: Prevnar 20 provided today. Mammogram and bone density orders placed. Colonoscopy overdue, her friend discourages her from the colonoscopy despite a family history of colon cancer. Patient chooses Cologuard. Orders placed.   Discussed the importance of a healthy diet and regular exercise in order for weight loss, and to reduce the risk of further co-morbidity.  Exam stable.  Labs pending.  Follow up in 1 year for repeat physical.    Type 2 diabetes mellitus with hyperglycemia, with long-term current use of insulin (HCC) Assessment & Plan: Well-controlled with A1c of 6.0 today!  Given her lack of weight loss, we both agreed to switch to [facility].  Start tirzepitide Greggory Keen) for diabetes/weight loss. Start by injecting 2.5 mg into the skin once weekly for 4 weeks, then increase to 5 mg once weekly thereafter.  Continue Evaristo Bury    Orders: -     POCT glycosylated hemoglobin (Hb A1C) -     Tirzepatide; Inject 2.5 mg into the skin once a week. for diabetes.  Dispense: 2 mL; Refill: 0  Rheumatoid arthritis involving multiple sites with positive rheumatoid factor [facility]) Assessment & Plan: Following with rheumatology ,office notes reviewed from January 2025. Now following with [facility].   Questionable diagnoses now per [facility].  Continue Plaquenil 200 mg twice daily.  Remain off methotrexate.   Essential hypertension Assessment & Plan: Controlled.  Continue amlodipine 10 mg daily, spironolactone 25 mg daily, telmisartan-hydrochlorothiazide 80-25 mg daily. CMP reviewed from February 2025.  Orders: -     Magnesium -     TSH -     T4, free  Fibromyalgia Assessment & Plan: Following  with rheumatology. Reviewed office notes from January 2025.   History of stroke Assessment & Plan: New stroke in November 2024 status post lumbar surgery. Following with neurology, office notes and labs reviewed from March 2025 through Care Everywhere.  Continue aspirin 81 mg daily, rosuvastatin 10 mg daily.   Estrogen deficiency -     DG Bone Density; Future  Screening mammogram for breast cancer -     3D Screening Mammogram, Left and Right; Future  Screening for colon cancer -     Cologuard  Moderate episode of recurrent major depressive disorder [facility]) Assessment & Plan: Deteriorated since stroke in November 2024.  She has failed multiple medications from different classes, therefore, we both agree that psychiatry evaluation will be the best step forward. Continue bupropion XL 300 mg daily, Lexapro 20 mg daily.  Referral placed for psychiatry.  Orders: -     Vitamin B12 -     Ambulatory referral to Psychiatry  Vitamin D deficiency Assessment & Plan: Repeat vitamin D level pending.  Orders: -     VITAMIN D 25 Hydroxy (Vit-D Deficiency, Fractures)  GAD (generalized anxiety disorder) Assessment & Plan: Deteriorated since stroke in November 2024.  She has failed multiple medications from different classes, therefore, we both agree that psychiatry evaluation will be the best step forward. Continue bupropion XL 300 mg daily, Lexapro 20 mg daily.  Referral placed for psychiatry.  Orders: -     Ambulatory referral to Psychiatry  Encounter for immunization -     Pneumococcal conjugate vaccine 20-valent  Monoclonal gammopathy Assessment & Plan: In remission.  Following with hematology/oncology. Reviewed office notes and labs from February 2025 through Care Everywhere.   Sjogren's syndrome, with unspecified organ involvement [facility]) Assessment & Plan: Questionable diagnosis now of her [facility] rheumatology. Follow-up with rheumatology as scheduled.   History  of lumbar fusion, L5-S1, L2-3, ALIF Assessment & Plan: Stable.  Continue PT for mobility, balance from surgery and from CVA in November 2024.   Hyperlipidemia, unspecified hyperlipidemia type Assessment & Plan: Lipid panel reviewed from March 2025 through Care Everywhere. Continue rosuvastatin 20 mg daily.           Doreene Nest, NP

## 2024-04-04 NOTE — Assessment & Plan Note (Signed)
 Now following with [facility].   Reviewed ANA from December 2024 through Care Everywhere.

## 2024-04-04 NOTE — Assessment & Plan Note (Signed)
 Prevnar 20 provided today. Mammogram and bone density orders placed. Colonoscopy overdue, her friend discourages her from the colonoscopy despite a family history of colon cancer. Patient chooses Cologuard. Orders placed.   Discussed the importance of a healthy diet and regular exercise in order for weight loss, and to reduce the risk of further co-morbidity.  Exam stable. Labs pending.  Follow up in 1 year for repeat physical.

## 2024-04-04 NOTE — Assessment & Plan Note (Signed)
 Well-controlled with A1c of 6.0 today!  Given her lack of weight loss, we both agreed to switch to [facility].  Start tirzepitide Greggory Keen) for diabetes/weight loss. Start by injecting 2.5 mg into the skin once weekly for 4 weeks, then increase to 5 mg once weekly thereafter.  Continue Evaristo Bury

## 2024-04-04 NOTE — Assessment & Plan Note (Addendum)
 New stroke in November 2024 status post lumbar surgery. Following with neurology, office notes and labs reviewed from March 2025 through Care Everywhere.  Continue aspirin 81 mg daily, rosuvastatin 10 mg daily.

## 2024-04-04 NOTE — Assessment & Plan Note (Addendum)
 Following with rheumatology ,office notes reviewed from January 2025. Now following with [facility].   Questionable diagnoses now per [facility].  Continue Plaquenil 200 mg twice daily.  Remain off methotrexate.

## 2024-04-05 DIAGNOSIS — Z7409 Other reduced mobility: Secondary | ICD-10-CM | POA: Diagnosis not present

## 2024-04-05 DIAGNOSIS — I639 Cerebral infarction, unspecified: Secondary | ICD-10-CM | POA: Diagnosis not present

## 2024-04-05 DIAGNOSIS — M6281 Muscle weakness (generalized): Secondary | ICD-10-CM | POA: Diagnosis not present

## 2024-04-05 DIAGNOSIS — M51369 Other intervertebral disc degeneration, lumbar region without mention of lumbar back pain or lower extremity pain: Secondary | ICD-10-CM | POA: Diagnosis not present

## 2024-04-05 DIAGNOSIS — Z4789 Encounter for other orthopedic aftercare: Secondary | ICD-10-CM | POA: Diagnosis not present

## 2024-04-16 ENCOUNTER — Ambulatory Visit: Payer: Medicare Other | Admitting: Physician Assistant

## 2024-04-24 DIAGNOSIS — I639 Cerebral infarction, unspecified: Secondary | ICD-10-CM | POA: Diagnosis not present

## 2024-04-24 DIAGNOSIS — M51369 Other intervertebral disc degeneration, lumbar region without mention of lumbar back pain or lower extremity pain: Secondary | ICD-10-CM | POA: Diagnosis not present

## 2024-04-24 DIAGNOSIS — Z4789 Encounter for other orthopedic aftercare: Secondary | ICD-10-CM | POA: Diagnosis not present

## 2024-04-24 DIAGNOSIS — I63532 Cerebral infarction due to unspecified occlusion or stenosis of left posterior cerebral artery: Secondary | ICD-10-CM | POA: Diagnosis not present

## 2024-04-24 DIAGNOSIS — M6281 Muscle weakness (generalized): Secondary | ICD-10-CM | POA: Diagnosis not present

## 2024-04-24 DIAGNOSIS — Z7409 Other reduced mobility: Secondary | ICD-10-CM | POA: Diagnosis not present

## 2024-04-24 DIAGNOSIS — I63531 Cerebral infarction due to unspecified occlusion or stenosis of right posterior cerebral artery: Secondary | ICD-10-CM | POA: Diagnosis not present

## 2024-04-26 DIAGNOSIS — M51369 Other intervertebral disc degeneration, lumbar region without mention of lumbar back pain or lower extremity pain: Secondary | ICD-10-CM | POA: Diagnosis not present

## 2024-04-26 DIAGNOSIS — Z7409 Other reduced mobility: Secondary | ICD-10-CM | POA: Diagnosis not present

## 2024-04-26 DIAGNOSIS — Z8673 Personal history of transient ischemic attack (TIA), and cerebral infarction without residual deficits: Secondary | ICD-10-CM | POA: Diagnosis not present

## 2024-04-26 DIAGNOSIS — M6281 Muscle weakness (generalized): Secondary | ICD-10-CM | POA: Diagnosis not present

## 2024-04-26 DIAGNOSIS — Z4789 Encounter for other orthopedic aftercare: Secondary | ICD-10-CM | POA: Diagnosis not present

## 2024-05-06 ENCOUNTER — Other Ambulatory Visit: Payer: Self-pay | Admitting: Primary Care

## 2024-05-06 DIAGNOSIS — F411 Generalized anxiety disorder: Secondary | ICD-10-CM

## 2024-05-06 DIAGNOSIS — F331 Major depressive disorder, recurrent, moderate: Secondary | ICD-10-CM

## 2024-05-08 DIAGNOSIS — Z8673 Personal history of transient ischemic attack (TIA), and cerebral infarction without residual deficits: Secondary | ICD-10-CM | POA: Diagnosis not present

## 2024-05-08 DIAGNOSIS — M51369 Other intervertebral disc degeneration, lumbar region without mention of lumbar back pain or lower extremity pain: Secondary | ICD-10-CM | POA: Diagnosis not present

## 2024-05-08 DIAGNOSIS — Z7409 Other reduced mobility: Secondary | ICD-10-CM | POA: Diagnosis not present

## 2024-05-08 DIAGNOSIS — M6281 Muscle weakness (generalized): Secondary | ICD-10-CM | POA: Diagnosis not present

## 2024-05-08 DIAGNOSIS — Z4789 Encounter for other orthopedic aftercare: Secondary | ICD-10-CM | POA: Diagnosis not present

## 2024-05-22 DIAGNOSIS — M6281 Muscle weakness (generalized): Secondary | ICD-10-CM | POA: Diagnosis not present

## 2024-05-22 DIAGNOSIS — Z8673 Personal history of transient ischemic attack (TIA), and cerebral infarction without residual deficits: Secondary | ICD-10-CM | POA: Diagnosis not present

## 2024-05-22 DIAGNOSIS — M51369 Other intervertebral disc degeneration, lumbar region without mention of lumbar back pain or lower extremity pain: Secondary | ICD-10-CM | POA: Diagnosis not present

## 2024-05-22 DIAGNOSIS — Z7409 Other reduced mobility: Secondary | ICD-10-CM | POA: Diagnosis not present

## 2024-05-22 DIAGNOSIS — Z4789 Encounter for other orthopedic aftercare: Secondary | ICD-10-CM | POA: Diagnosis not present

## 2024-05-23 DIAGNOSIS — M419 Scoliosis, unspecified: Secondary | ICD-10-CM | POA: Diagnosis not present

## 2024-05-23 DIAGNOSIS — Z981 Arthrodesis status: Secondary | ICD-10-CM | POA: Diagnosis not present

## 2024-05-23 DIAGNOSIS — M21161 Varus deformity, not elsewhere classified, right knee: Secondary | ICD-10-CM | POA: Diagnosis not present

## 2024-05-23 DIAGNOSIS — M21162 Varus deformity, not elsewhere classified, left knee: Secondary | ICD-10-CM | POA: Diagnosis not present

## 2024-05-25 ENCOUNTER — Other Ambulatory Visit: Payer: Self-pay | Admitting: Primary Care

## 2024-05-25 DIAGNOSIS — E1165 Type 2 diabetes mellitus with hyperglycemia: Secondary | ICD-10-CM

## 2024-05-29 DIAGNOSIS — I63531 Cerebral infarction due to unspecified occlusion or stenosis of right posterior cerebral artery: Secondary | ICD-10-CM | POA: Diagnosis not present

## 2024-05-29 DIAGNOSIS — I639 Cerebral infarction, unspecified: Secondary | ICD-10-CM | POA: Diagnosis not present

## 2024-05-29 DIAGNOSIS — M51369 Other intervertebral disc degeneration, lumbar region without mention of lumbar back pain or lower extremity pain: Secondary | ICD-10-CM | POA: Diagnosis not present

## 2024-05-29 DIAGNOSIS — Z7409 Other reduced mobility: Secondary | ICD-10-CM | POA: Diagnosis not present

## 2024-05-29 DIAGNOSIS — M6281 Muscle weakness (generalized): Secondary | ICD-10-CM | POA: Diagnosis not present

## 2024-05-29 DIAGNOSIS — Z4789 Encounter for other orthopedic aftercare: Secondary | ICD-10-CM | POA: Diagnosis not present

## 2024-05-31 DIAGNOSIS — M6281 Muscle weakness (generalized): Secondary | ICD-10-CM | POA: Diagnosis not present

## 2024-05-31 DIAGNOSIS — I639 Cerebral infarction, unspecified: Secondary | ICD-10-CM | POA: Diagnosis not present

## 2024-05-31 DIAGNOSIS — Z7409 Other reduced mobility: Secondary | ICD-10-CM | POA: Diagnosis not present

## 2024-05-31 DIAGNOSIS — Z4789 Encounter for other orthopedic aftercare: Secondary | ICD-10-CM | POA: Diagnosis not present

## 2024-05-31 DIAGNOSIS — M51369 Other intervertebral disc degeneration, lumbar region without mention of lumbar back pain or lower extremity pain: Secondary | ICD-10-CM | POA: Diagnosis not present

## 2024-06-05 DIAGNOSIS — H542X22 Low vision right eye category 2, low vision left eye category 2: Secondary | ICD-10-CM | POA: Diagnosis not present

## 2024-06-05 DIAGNOSIS — H5372 Impaired contrast sensitivity: Secondary | ICD-10-CM | POA: Diagnosis not present

## 2024-06-05 DIAGNOSIS — H53462 Homonymous bilateral field defects, left side: Secondary | ICD-10-CM | POA: Diagnosis not present

## 2024-06-05 DIAGNOSIS — Z5982 Transportation insecurity: Secondary | ICD-10-CM | POA: Diagnosis not present

## 2024-06-05 DIAGNOSIS — Z753 Unavailability and inaccessibility of health-care facilities: Secondary | ICD-10-CM | POA: Diagnosis not present

## 2024-06-05 DIAGNOSIS — H53453 Other localized visual field defect, bilateral: Secondary | ICD-10-CM | POA: Diagnosis not present

## 2024-06-05 DIAGNOSIS — I63531 Cerebral infarction due to unspecified occlusion or stenosis of right posterior cerebral artery: Secondary | ICD-10-CM | POA: Diagnosis not present

## 2024-06-07 DIAGNOSIS — M6281 Muscle weakness (generalized): Secondary | ICD-10-CM | POA: Diagnosis not present

## 2024-06-07 DIAGNOSIS — Z4789 Encounter for other orthopedic aftercare: Secondary | ICD-10-CM | POA: Diagnosis not present

## 2024-06-07 DIAGNOSIS — I639 Cerebral infarction, unspecified: Secondary | ICD-10-CM | POA: Diagnosis not present

## 2024-06-07 DIAGNOSIS — Z7409 Other reduced mobility: Secondary | ICD-10-CM | POA: Diagnosis not present

## 2024-06-07 DIAGNOSIS — M51369 Other intervertebral disc degeneration, lumbar region without mention of lumbar back pain or lower extremity pain: Secondary | ICD-10-CM | POA: Diagnosis not present

## 2024-06-09 DIAGNOSIS — Z981 Arthrodesis status: Secondary | ICD-10-CM | POA: Diagnosis not present

## 2024-06-09 DIAGNOSIS — Z4789 Encounter for other orthopedic aftercare: Secondary | ICD-10-CM | POA: Diagnosis not present

## 2024-06-12 DIAGNOSIS — Z7409 Other reduced mobility: Secondary | ICD-10-CM | POA: Diagnosis not present

## 2024-06-12 DIAGNOSIS — M51369 Other intervertebral disc degeneration, lumbar region without mention of lumbar back pain or lower extremity pain: Secondary | ICD-10-CM | POA: Diagnosis not present

## 2024-06-12 DIAGNOSIS — Z4789 Encounter for other orthopedic aftercare: Secondary | ICD-10-CM | POA: Diagnosis not present

## 2024-06-12 DIAGNOSIS — I639 Cerebral infarction, unspecified: Secondary | ICD-10-CM | POA: Diagnosis not present

## 2024-06-12 DIAGNOSIS — M6281 Muscle weakness (generalized): Secondary | ICD-10-CM | POA: Diagnosis not present

## 2024-06-17 DIAGNOSIS — E1165 Type 2 diabetes mellitus with hyperglycemia: Secondary | ICD-10-CM

## 2024-06-18 MED ORDER — TIRZEPATIDE 7.5 MG/0.5ML ~~LOC~~ SOAJ: 7.5000 mg | SUBCUTANEOUS | 0 refills | Status: AC

## 2024-06-19 DIAGNOSIS — M6281 Muscle weakness (generalized): Secondary | ICD-10-CM | POA: Diagnosis not present

## 2024-06-19 DIAGNOSIS — I639 Cerebral infarction, unspecified: Secondary | ICD-10-CM | POA: Diagnosis not present

## 2024-06-19 DIAGNOSIS — Z7409 Other reduced mobility: Secondary | ICD-10-CM | POA: Diagnosis not present

## 2024-06-19 DIAGNOSIS — Z4789 Encounter for other orthopedic aftercare: Secondary | ICD-10-CM | POA: Diagnosis not present

## 2024-06-19 DIAGNOSIS — M51369 Other intervertebral disc degeneration, lumbar region without mention of lumbar back pain or lower extremity pain: Secondary | ICD-10-CM | POA: Diagnosis not present

## 2024-06-20 ENCOUNTER — Other Ambulatory Visit: Payer: Self-pay | Admitting: Primary Care

## 2024-06-20 DIAGNOSIS — E785 Hyperlipidemia, unspecified: Secondary | ICD-10-CM

## 2024-06-21 DIAGNOSIS — E1129 Type 2 diabetes mellitus with other diabetic kidney complication: Secondary | ICD-10-CM | POA: Diagnosis not present

## 2024-06-21 DIAGNOSIS — E559 Vitamin D deficiency, unspecified: Secondary | ICD-10-CM | POA: Diagnosis not present

## 2024-06-21 DIAGNOSIS — R809 Proteinuria, unspecified: Secondary | ICD-10-CM | POA: Diagnosis not present

## 2024-06-22 ENCOUNTER — Other Ambulatory Visit: Payer: Self-pay | Admitting: Physician Assistant

## 2024-06-25 DIAGNOSIS — E119 Type 2 diabetes mellitus without complications: Secondary | ICD-10-CM | POA: Diagnosis not present

## 2024-06-25 DIAGNOSIS — Z8673 Personal history of transient ischemic attack (TIA), and cerebral infarction without residual deficits: Secondary | ICD-10-CM | POA: Diagnosis not present

## 2024-06-25 DIAGNOSIS — L932 Other local lupus erythematosus: Secondary | ICD-10-CM | POA: Diagnosis not present

## 2024-06-27 ENCOUNTER — Other Ambulatory Visit: Payer: Self-pay | Admitting: Primary Care

## 2024-06-27 DIAGNOSIS — I10 Essential (primary) hypertension: Secondary | ICD-10-CM

## 2024-06-28 ENCOUNTER — Other Ambulatory Visit: Payer: Self-pay | Admitting: Primary Care

## 2024-06-28 DIAGNOSIS — Z794 Long term (current) use of insulin: Secondary | ICD-10-CM

## 2024-06-28 DIAGNOSIS — I10 Essential (primary) hypertension: Secondary | ICD-10-CM

## 2024-06-28 NOTE — Telephone Encounter (Signed)
 Patient seems to have established care through [facility]. Will remove myself as PCP.

## 2024-07-01 ENCOUNTER — Other Ambulatory Visit: Payer: Self-pay | Admitting: Physician Assistant

## 2024-07-10 DIAGNOSIS — I63531 Cerebral infarction due to unspecified occlusion or stenosis of right posterior cerebral artery: Secondary | ICD-10-CM | POA: Diagnosis not present

## 2024-07-10 DIAGNOSIS — H53462 Homonymous bilateral field defects, left side: Secondary | ICD-10-CM | POA: Diagnosis not present

## 2024-07-10 DIAGNOSIS — H53453 Other localized visual field defect, bilateral: Secondary | ICD-10-CM | POA: Diagnosis not present

## 2024-07-23 DIAGNOSIS — Z8673 Personal history of transient ischemic attack (TIA), and cerebral infarction without residual deficits: Secondary | ICD-10-CM | POA: Diagnosis not present

## 2024-07-23 DIAGNOSIS — H53462 Homonymous bilateral field defects, left side: Secondary | ICD-10-CM | POA: Diagnosis not present

## 2024-08-03 DIAGNOSIS — Z8673 Personal history of transient ischemic attack (TIA), and cerebral infarction without residual deficits: Secondary | ICD-10-CM | POA: Diagnosis not present

## 2024-08-03 DIAGNOSIS — H53462 Homonymous bilateral field defects, left side: Secondary | ICD-10-CM | POA: Diagnosis not present

## 2024-08-06 DIAGNOSIS — Z8673 Personal history of transient ischemic attack (TIA), and cerebral infarction without residual deficits: Secondary | ICD-10-CM | POA: Diagnosis not present

## 2024-08-06 DIAGNOSIS — H53462 Homonymous bilateral field defects, left side: Secondary | ICD-10-CM | POA: Diagnosis not present

## 2024-08-07 DIAGNOSIS — N3000 Acute cystitis without hematuria: Secondary | ICD-10-CM | POA: Diagnosis not present

## 2024-08-07 DIAGNOSIS — G4709 Other insomnia: Secondary | ICD-10-CM | POA: Diagnosis not present

## 2024-08-13 DIAGNOSIS — Z8673 Personal history of transient ischemic attack (TIA), and cerebral infarction without residual deficits: Secondary | ICD-10-CM | POA: Diagnosis not present

## 2024-08-13 DIAGNOSIS — H53462 Homonymous bilateral field defects, left side: Secondary | ICD-10-CM | POA: Diagnosis not present

## 2024-08-20 DIAGNOSIS — I63531 Cerebral infarction due to unspecified occlusion or stenosis of right posterior cerebral artery: Secondary | ICD-10-CM | POA: Diagnosis not present

## 2024-08-20 DIAGNOSIS — H53462 Homonymous bilateral field defects, left side: Secondary | ICD-10-CM | POA: Diagnosis not present

## 2024-08-20 DIAGNOSIS — H543 Unqualified visual loss, both eyes: Secondary | ICD-10-CM | POA: Diagnosis not present

## 2024-08-20 DIAGNOSIS — H53453 Other localized visual field defect, bilateral: Secondary | ICD-10-CM | POA: Diagnosis not present

## 2024-08-24 DIAGNOSIS — Z8673 Personal history of transient ischemic attack (TIA), and cerebral infarction without residual deficits: Secondary | ICD-10-CM | POA: Diagnosis not present

## 2024-08-24 DIAGNOSIS — H53462 Homonymous bilateral field defects, left side: Secondary | ICD-10-CM | POA: Diagnosis not present

## 2024-08-29 DIAGNOSIS — G4709 Other insomnia: Secondary | ICD-10-CM | POA: Diagnosis not present

## 2024-09-03 DIAGNOSIS — Z8673 Personal history of transient ischemic attack (TIA), and cerebral infarction without residual deficits: Secondary | ICD-10-CM | POA: Diagnosis not present

## 2024-09-03 DIAGNOSIS — H53462 Homonymous bilateral field defects, left side: Secondary | ICD-10-CM | POA: Diagnosis not present

## 2024-09-11 DIAGNOSIS — R809 Proteinuria, unspecified: Secondary | ICD-10-CM | POA: Diagnosis not present

## 2024-09-11 DIAGNOSIS — E559 Vitamin D deficiency, unspecified: Secondary | ICD-10-CM | POA: Diagnosis not present

## 2024-09-11 DIAGNOSIS — E1129 Type 2 diabetes mellitus with other diabetic kidney complication: Secondary | ICD-10-CM | POA: Diagnosis not present

## 2024-09-11 DIAGNOSIS — G4709 Other insomnia: Secondary | ICD-10-CM | POA: Diagnosis not present

## 2024-09-11 DIAGNOSIS — N183 Chronic kidney disease, stage 3 unspecified: Secondary | ICD-10-CM | POA: Diagnosis not present

## 2024-09-11 DIAGNOSIS — I639 Cerebral infarction, unspecified: Secondary | ICD-10-CM | POA: Diagnosis not present

## 2024-09-17 DIAGNOSIS — Z8673 Personal history of transient ischemic attack (TIA), and cerebral infarction without residual deficits: Secondary | ICD-10-CM | POA: Diagnosis not present

## 2024-09-17 DIAGNOSIS — H53462 Homonymous bilateral field defects, left side: Secondary | ICD-10-CM | POA: Diagnosis not present

## 2024-09-24 DIAGNOSIS — E1129 Type 2 diabetes mellitus with other diabetic kidney complication: Secondary | ICD-10-CM | POA: Diagnosis not present

## 2024-09-24 DIAGNOSIS — N183 Chronic kidney disease, stage 3 unspecified: Secondary | ICD-10-CM | POA: Diagnosis not present

## 2024-09-24 DIAGNOSIS — E559 Vitamin D deficiency, unspecified: Secondary | ICD-10-CM | POA: Diagnosis not present

## 2024-09-24 DIAGNOSIS — R809 Proteinuria, unspecified: Secondary | ICD-10-CM | POA: Diagnosis not present

## 2024-09-24 DIAGNOSIS — I639 Cerebral infarction, unspecified: Secondary | ICD-10-CM | POA: Diagnosis not present

## 2024-09-28 DIAGNOSIS — E782 Mixed hyperlipidemia: Secondary | ICD-10-CM | POA: Diagnosis not present

## 2024-09-28 DIAGNOSIS — Z794 Long term (current) use of insulin: Secondary | ICD-10-CM | POA: Diagnosis not present

## 2024-09-28 DIAGNOSIS — I63531 Cerebral infarction due to unspecified occlusion or stenosis of right posterior cerebral artery: Secondary | ICD-10-CM | POA: Diagnosis not present

## 2024-09-28 DIAGNOSIS — E119 Type 2 diabetes mellitus without complications: Secondary | ICD-10-CM | POA: Diagnosis not present

## 2024-09-28 DIAGNOSIS — I10 Essential (primary) hypertension: Secondary | ICD-10-CM | POA: Diagnosis not present

## 2024-10-01 DIAGNOSIS — Z8673 Personal history of transient ischemic attack (TIA), and cerebral infarction without residual deficits: Secondary | ICD-10-CM | POA: Diagnosis not present

## 2024-10-01 DIAGNOSIS — H53462 Homonymous bilateral field defects, left side: Secondary | ICD-10-CM | POA: Diagnosis not present

## 2024-10-02 DIAGNOSIS — M5416 Radiculopathy, lumbar region: Secondary | ICD-10-CM | POA: Diagnosis not present

## 2024-10-08 DIAGNOSIS — Z8673 Personal history of transient ischemic attack (TIA), and cerebral infarction without residual deficits: Secondary | ICD-10-CM | POA: Diagnosis not present

## 2024-10-08 DIAGNOSIS — H53462 Homonymous bilateral field defects, left side: Secondary | ICD-10-CM | POA: Diagnosis not present

## 2024-10-08 NOTE — Progress Notes (Signed)
 AHONESTY WOODFIN                                          MRN: [id number]   10/08/2024   The [facility]BCI Quality Team Specialist reviewed this patient medical record for the purposes of chart review for care gap closure. The following were reviewed: abstraction for care gap closure-glycemic status assessment.    VBCI Quality Team

## 2024-10-08 NOTE — Progress Notes (Signed)
 NIMSI MALES                                          MRN: [id number]   10/08/2024   The VBCI Quality Team Specialist reviewed this patient medical record for the purposes of chart review for care gap closure. The following were reviewed: chart review for care gap closure-breast cancer screening, colorectal cancer screening, and kidney health evaluation for diabetes:eGFR  and uACR.    VBCI Quality Team

## 2024-10-11 DIAGNOSIS — L932 Other local lupus erythematosus: Secondary | ICD-10-CM | POA: Diagnosis not present

## 2024-10-11 DIAGNOSIS — Z8673 Personal history of transient ischemic attack (TIA), and cerebral infarction without residual deficits: Secondary | ICD-10-CM | POA: Diagnosis not present

## 2024-10-22 DIAGNOSIS — H53462 Homonymous bilateral field defects, left side: Secondary | ICD-10-CM | POA: Diagnosis not present

## 2024-10-22 DIAGNOSIS — Z8673 Personal history of transient ischemic attack (TIA), and cerebral infarction without residual deficits: Secondary | ICD-10-CM | POA: Diagnosis not present

## 2024-11-13 NOTE — Progress Notes (Signed)
 ALSACE DOWD                                          MRN: [id number]   11/13/2024   The VBCI Quality Team Specialist reviewed this patient medical record for the purposes of chart review for care gap closure. The following were reviewed: chart review for care gap closure-breast cancer screening, colorectal cancer screening, and kidney health evaluation for diabetes:eGFR  and uACR.    VBCI Quality Team

## 2024-12-16 NOTE — Telephone Encounter (Signed)
 Can we get her a copy of her prior disability forms? It would be from last year. I'm not sure how we go about that. They should be in media.

## 2024-12-17 NOTE — Telephone Encounter (Signed)
 Let's contact patient to see how she would like to receive these.

## 2024-12-17 NOTE — Progress Notes (Signed)
 TUWANA KAPAUN                                          MRN: [id number]   12/17/2024   The VBCI Quality Team Specialist reviewed this patient medical record for the purposes of chart review for care gap closure. The following were reviewed: chart review for care gap closure-breast cancer screening, colorectal cancer screening, and kidney health evaluation for diabetes:eGFR  and uACR.    VBCI Quality Team

## 2024-12-19 NOTE — Telephone Encounter (Signed)
 Noted.   Mailed ppw to pt and made copy to scan.

## 2024-12-19 NOTE — Telephone Encounter (Signed)
 Olam, can we mail her paperwork? It's up front under her last name.
